# Patient Record
Sex: Female | Born: 1935 | Race: White | Hispanic: No | Marital: Married | Smoking: Never smoker
Health system: Southern US, Community
[De-identification: ages and names within clinical notes are randomized; demographics above are authoritative.]

## PROBLEM LIST (undated history)

## (undated) DIAGNOSIS — K219 Gastro-esophageal reflux disease without esophagitis: Secondary | ICD-10-CM

## (undated) DIAGNOSIS — H3530 Unspecified macular degeneration: Secondary | ICD-10-CM

## (undated) DIAGNOSIS — I10 Essential (primary) hypertension: Secondary | ICD-10-CM

## (undated) DIAGNOSIS — Z950 Presence of cardiac pacemaker: Secondary | ICD-10-CM

## (undated) DIAGNOSIS — M1990 Unspecified osteoarthritis, unspecified site: Secondary | ICD-10-CM

## (undated) DIAGNOSIS — I480 Paroxysmal atrial fibrillation: Secondary | ICD-10-CM

## (undated) DIAGNOSIS — H3560 Retinal hemorrhage, unspecified eye: Secondary | ICD-10-CM

## (undated) DIAGNOSIS — C50911 Malignant neoplasm of unspecified site of right female breast: Secondary | ICD-10-CM

## (undated) HISTORY — PX: BREAST BIOPSY: SHX20

## (undated) HISTORY — PX: TONSILLECTOMY: SUR1361

## (undated) HISTORY — DX: Paroxysmal atrial fibrillation: I48.0

## (undated) HISTORY — PX: DILATION AND CURETTAGE OF UTERUS: SHX78

## (undated) HISTORY — DX: Essential (primary) hypertension: I10

## (undated) HISTORY — DX: Retinal hemorrhage, unspecified eye: H35.60

## (undated) HISTORY — PX: INSERT / REPLACE / REMOVE PACEMAKER: SUR710

## (undated) HISTORY — DX: Unspecified osteoarthritis, unspecified site: M19.90

## (undated) HISTORY — DX: Unspecified macular degeneration: H35.30

---

## 1997-02-10 DIAGNOSIS — C50911 Malignant neoplasm of unspecified site of right female breast: Secondary | ICD-10-CM

## 1997-02-10 HISTORY — DX: Malignant neoplasm of unspecified site of right female breast: C50.911

## 1997-02-10 HISTORY — PX: BREAST LUMPECTOMY: SHX2

## 1997-09-08 ENCOUNTER — Ambulatory Visit (HOSPITAL_COMMUNITY): Admission: RE | Admit: 1997-09-08 | Discharge: 1997-09-08 | Payer: Self-pay | Admitting: Internal Medicine

## 1997-09-08 ENCOUNTER — Ambulatory Visit (HOSPITAL_COMMUNITY): Admission: RE | Admit: 1997-09-08 | Discharge: 1997-09-08 | Payer: Self-pay | Admitting: Obstetrics & Gynecology

## 1997-09-13 ENCOUNTER — Ambulatory Visit (HOSPITAL_COMMUNITY): Admission: RE | Admit: 1997-09-13 | Discharge: 1997-09-13 | Payer: Self-pay | Admitting: Internal Medicine

## 1997-10-03 ENCOUNTER — Ambulatory Visit (HOSPITAL_COMMUNITY): Admission: RE | Admit: 1997-10-03 | Discharge: 1997-10-03 | Payer: Self-pay | Admitting: *Deleted

## 1997-10-18 ENCOUNTER — Ambulatory Visit (HOSPITAL_COMMUNITY): Admission: RE | Admit: 1997-10-18 | Discharge: 1997-10-18 | Payer: Self-pay | Admitting: *Deleted

## 1997-11-07 ENCOUNTER — Encounter: Admission: RE | Admit: 1997-11-07 | Discharge: 1998-02-05 | Payer: Self-pay | Admitting: Radiation Oncology

## 1998-05-16 ENCOUNTER — Ambulatory Visit (HOSPITAL_COMMUNITY): Admission: RE | Admit: 1998-05-16 | Discharge: 1998-05-16 | Payer: Self-pay

## 1998-07-27 ENCOUNTER — Ambulatory Visit (HOSPITAL_COMMUNITY): Admission: RE | Admit: 1998-07-27 | Discharge: 1998-07-27 | Payer: Self-pay | Admitting: *Deleted

## 1998-09-24 ENCOUNTER — Encounter: Payer: Self-pay | Admitting: Internal Medicine

## 1998-09-24 ENCOUNTER — Ambulatory Visit (HOSPITAL_COMMUNITY): Admission: RE | Admit: 1998-09-24 | Discharge: 1998-09-24 | Payer: Self-pay | Admitting: Internal Medicine

## 1998-10-08 ENCOUNTER — Ambulatory Visit (HOSPITAL_COMMUNITY): Admission: RE | Admit: 1998-10-08 | Discharge: 1998-10-08 | Payer: Self-pay | Admitting: Internal Medicine

## 1998-10-08 ENCOUNTER — Encounter: Payer: Self-pay | Admitting: Internal Medicine

## 1998-10-26 ENCOUNTER — Ambulatory Visit (HOSPITAL_COMMUNITY): Admission: RE | Admit: 1998-10-26 | Discharge: 1998-10-26 | Payer: Self-pay | Admitting: *Deleted

## 1998-11-12 ENCOUNTER — Encounter: Admission: RE | Admit: 1998-11-12 | Discharge: 1999-02-10 | Payer: Self-pay | Admitting: Gastroenterology

## 1999-02-05 ENCOUNTER — Encounter: Payer: Self-pay | Admitting: Internal Medicine

## 1999-02-05 ENCOUNTER — Inpatient Hospital Stay (HOSPITAL_COMMUNITY): Admission: EM | Admit: 1999-02-05 | Discharge: 1999-02-06 | Payer: Self-pay | Admitting: Cardiovascular Disease

## 1999-02-05 HISTORY — PX: CARDIAC CATHETERIZATION: SHX172

## 1999-03-04 ENCOUNTER — Encounter: Payer: Self-pay | Admitting: Internal Medicine

## 1999-03-04 ENCOUNTER — Encounter: Admission: RE | Admit: 1999-03-04 | Discharge: 1999-03-04 | Payer: Self-pay | Admitting: Internal Medicine

## 1999-03-04 ENCOUNTER — Encounter: Admission: RE | Admit: 1999-03-04 | Discharge: 1999-06-02 | Payer: Self-pay | Admitting: Gastroenterology

## 1999-04-24 ENCOUNTER — Ambulatory Visit (HOSPITAL_COMMUNITY): Admission: RE | Admit: 1999-04-24 | Discharge: 1999-04-24 | Payer: Self-pay | Admitting: General Surgery

## 1999-04-24 ENCOUNTER — Encounter: Payer: Self-pay | Admitting: General Surgery

## 1999-07-12 ENCOUNTER — Ambulatory Visit (HOSPITAL_COMMUNITY): Admission: RE | Admit: 1999-07-12 | Discharge: 1999-07-12 | Payer: Self-pay | Admitting: Obstetrics & Gynecology

## 1999-09-12 ENCOUNTER — Encounter: Admission: RE | Admit: 1999-09-12 | Discharge: 1999-12-11 | Payer: Self-pay | Admitting: Gastroenterology

## 1999-11-20 ENCOUNTER — Encounter: Payer: Self-pay | Admitting: General Surgery

## 1999-11-20 ENCOUNTER — Encounter: Admission: RE | Admit: 1999-11-20 | Discharge: 1999-11-20 | Payer: Self-pay | Admitting: General Surgery

## 2000-01-21 ENCOUNTER — Encounter: Admission: RE | Admit: 2000-01-21 | Discharge: 2000-04-20 | Payer: Self-pay | Admitting: Gastroenterology

## 2000-06-03 ENCOUNTER — Encounter (INDEPENDENT_AMBULATORY_CARE_PROVIDER_SITE_OTHER): Payer: Self-pay | Admitting: Specialist

## 2000-06-03 ENCOUNTER — Ambulatory Visit (HOSPITAL_COMMUNITY): Admission: RE | Admit: 2000-06-03 | Discharge: 2000-06-03 | Payer: Self-pay | Admitting: Gastroenterology

## 2000-06-04 ENCOUNTER — Encounter: Admission: RE | Admit: 2000-06-04 | Discharge: 2000-09-02 | Payer: Self-pay | Admitting: Gastroenterology

## 2000-07-24 ENCOUNTER — Ambulatory Visit (HOSPITAL_COMMUNITY): Admission: RE | Admit: 2000-07-24 | Discharge: 2000-07-24 | Payer: Self-pay | Admitting: *Deleted

## 2000-09-09 ENCOUNTER — Ambulatory Visit (HOSPITAL_COMMUNITY): Admission: RE | Admit: 2000-09-09 | Discharge: 2000-09-09 | Payer: Self-pay | Admitting: Gastroenterology

## 2000-10-29 ENCOUNTER — Encounter: Admission: RE | Admit: 2000-10-29 | Discharge: 2001-01-27 | Payer: Self-pay | Admitting: Gastroenterology

## 2000-11-23 ENCOUNTER — Encounter: Payer: Self-pay | Admitting: General Surgery

## 2000-11-23 ENCOUNTER — Encounter: Admission: RE | Admit: 2000-11-23 | Discharge: 2000-11-23 | Payer: Self-pay | Admitting: General Surgery

## 2001-04-29 ENCOUNTER — Encounter: Admission: RE | Admit: 2001-04-29 | Discharge: 2001-07-28 | Payer: Self-pay | Admitting: Gastroenterology

## 2001-07-30 ENCOUNTER — Ambulatory Visit (HOSPITAL_COMMUNITY): Admission: RE | Admit: 2001-07-30 | Discharge: 2001-07-30 | Payer: Self-pay | Admitting: Obstetrics & Gynecology

## 2001-08-25 ENCOUNTER — Emergency Department (HOSPITAL_COMMUNITY): Admission: EM | Admit: 2001-08-25 | Discharge: 2001-08-25 | Payer: Self-pay | Admitting: Emergency Medicine

## 2001-08-25 ENCOUNTER — Encounter: Payer: Self-pay | Admitting: Emergency Medicine

## 2001-10-13 ENCOUNTER — Encounter: Admission: RE | Admit: 2001-10-13 | Discharge: 2001-11-24 | Payer: Self-pay | Admitting: Gastroenterology

## 2001-11-26 ENCOUNTER — Encounter: Payer: Self-pay | Admitting: Internal Medicine

## 2001-11-26 ENCOUNTER — Encounter: Admission: RE | Admit: 2001-11-26 | Discharge: 2001-11-26 | Payer: Self-pay | Admitting: Internal Medicine

## 2001-12-13 ENCOUNTER — Encounter: Admission: RE | Admit: 2001-12-13 | Discharge: 2002-03-13 | Payer: Self-pay | Admitting: Gastroenterology

## 2002-01-11 ENCOUNTER — Emergency Department (HOSPITAL_COMMUNITY): Admission: EM | Admit: 2002-01-11 | Discharge: 2002-01-11 | Payer: Self-pay | Admitting: Emergency Medicine

## 2002-08-19 ENCOUNTER — Ambulatory Visit (HOSPITAL_COMMUNITY): Admission: RE | Admit: 2002-08-19 | Discharge: 2002-08-19 | Payer: Self-pay | Admitting: Obstetrics & Gynecology

## 2002-10-25 ENCOUNTER — Ambulatory Visit (HOSPITAL_COMMUNITY): Admission: RE | Admit: 2002-10-25 | Discharge: 2002-10-25 | Payer: Self-pay | Admitting: Gastroenterology

## 2002-11-28 ENCOUNTER — Encounter: Admission: RE | Admit: 2002-11-28 | Discharge: 2002-11-28 | Payer: Self-pay | Admitting: Internal Medicine

## 2002-11-28 ENCOUNTER — Encounter: Payer: Self-pay | Admitting: Internal Medicine

## 2003-11-29 ENCOUNTER — Encounter: Admission: RE | Admit: 2003-11-29 | Discharge: 2003-11-29 | Payer: Self-pay | Admitting: General Surgery

## 2003-12-04 ENCOUNTER — Encounter (HOSPITAL_COMMUNITY): Admission: RE | Admit: 2003-12-04 | Discharge: 2004-03-03 | Payer: Self-pay | Admitting: General Surgery

## 2004-04-29 ENCOUNTER — Emergency Department (HOSPITAL_COMMUNITY): Admission: EM | Admit: 2004-04-29 | Discharge: 2004-04-29 | Payer: Self-pay | Admitting: Emergency Medicine

## 2004-11-29 ENCOUNTER — Encounter: Admission: RE | Admit: 2004-11-29 | Discharge: 2004-11-29 | Payer: Self-pay | Admitting: General Surgery

## 2005-05-15 ENCOUNTER — Encounter: Admission: RE | Admit: 2005-05-15 | Discharge: 2005-05-15 | Payer: Self-pay | Admitting: General Surgery

## 2005-12-01 ENCOUNTER — Encounter: Admission: RE | Admit: 2005-12-01 | Discharge: 2005-12-01 | Payer: Self-pay | Admitting: General Surgery

## 2005-12-15 ENCOUNTER — Encounter: Admission: RE | Admit: 2005-12-15 | Discharge: 2005-12-15 | Payer: Self-pay | Admitting: General Surgery

## 2006-06-27 ENCOUNTER — Emergency Department (HOSPITAL_COMMUNITY): Admission: EM | Admit: 2006-06-27 | Discharge: 2006-06-27 | Payer: Self-pay | Admitting: Family Medicine

## 2006-06-27 ENCOUNTER — Ambulatory Visit (HOSPITAL_COMMUNITY): Admission: RE | Admit: 2006-06-27 | Discharge: 2006-06-27 | Payer: Self-pay | Admitting: Family Medicine

## 2006-09-28 ENCOUNTER — Ambulatory Visit (HOSPITAL_COMMUNITY): Admission: RE | Admit: 2006-09-28 | Discharge: 2006-09-28 | Payer: Self-pay | Admitting: Internal Medicine

## 2006-10-14 ENCOUNTER — Ambulatory Visit (HOSPITAL_COMMUNITY): Admission: RE | Admit: 2006-10-14 | Discharge: 2006-10-14 | Payer: Self-pay | Admitting: Internal Medicine

## 2006-12-01 ENCOUNTER — Emergency Department (HOSPITAL_COMMUNITY): Admission: EM | Admit: 2006-12-01 | Discharge: 2006-12-01 | Payer: Self-pay | Admitting: Emergency Medicine

## 2006-12-03 ENCOUNTER — Encounter: Admission: RE | Admit: 2006-12-03 | Discharge: 2006-12-03 | Payer: Self-pay | Admitting: Surgery

## 2007-01-05 ENCOUNTER — Emergency Department (HOSPITAL_COMMUNITY): Admission: EM | Admit: 2007-01-05 | Discharge: 2007-01-05 | Payer: Self-pay | Admitting: Emergency Medicine

## 2007-01-05 ENCOUNTER — Ambulatory Visit: Payer: Self-pay | Admitting: Cardiovascular Disease

## 2007-08-12 ENCOUNTER — Encounter: Admission: RE | Admit: 2007-08-12 | Discharge: 2007-08-12 | Payer: Self-pay | Admitting: Surgery

## 2007-11-13 ENCOUNTER — Emergency Department (HOSPITAL_COMMUNITY): Admission: EM | Admit: 2007-11-13 | Discharge: 2007-11-13 | Payer: Self-pay | Admitting: Emergency Medicine

## 2007-12-06 ENCOUNTER — Encounter: Admission: RE | Admit: 2007-12-06 | Discharge: 2007-12-06 | Payer: Self-pay | Admitting: Surgery

## 2007-12-21 ENCOUNTER — Encounter: Admission: RE | Admit: 2007-12-21 | Discharge: 2007-12-21 | Payer: Self-pay | Admitting: Surgery

## 2008-12-06 ENCOUNTER — Encounter: Admission: RE | Admit: 2008-12-06 | Discharge: 2008-12-06 | Payer: Self-pay | Admitting: Surgery

## 2009-12-07 ENCOUNTER — Encounter: Admission: RE | Admit: 2009-12-07 | Discharge: 2009-12-07 | Payer: Self-pay | Admitting: Surgery

## 2010-01-18 ENCOUNTER — Ambulatory Visit: Payer: Self-pay | Admitting: Cardiovascular Disease

## 2010-02-10 HISTORY — PX: ENUCLEATION: SHX628

## 2010-06-25 NOTE — Consult Note (Signed)
Megan Hester, Megan Hester NO.:  [id number]   MEDICAL RECORD NO.:  [id number]          PATIENT TYPE:  EMS   LOCATION:  MAJO                         FACILITY:  [facility]   PHYSICIAN:  Christell Faith, MD   DATE OF BIRTH:  03-13-1930   DATE OF CONSULTATION:  01/05/2007  DATE OF DISCHARGE:                                 CONSULTATION   CARDIOLOGY EMERGENCY ROOM CONSULTATION   CARDIOLOGIST:  Dr. Elease Hashimoto.   CHIEF COMPLAINT:  Atrial fibrillation.   HISTORY OF PRESENT ILLNESS:  This is a 77 year old white female with a  history of a negative coronary angiogram in 2000 and paroxysmal atrial  fibrillation for at least that long, which has been well controlled  throughout the years on [place].  However, she has been having more  atrial fibrillation episodes recently.  She says approximately once a  week she feels herself go into atrial fibrillation.  Usually it  spontaneously converts to sinus rhythm within a matter of minutes;  however, tonight at approximately 7 p.m., she once again felt herself go  into atrial fibrillation, tried to sleep but could not, the rhythm  persisted until 1 a.m., at which time she presented to the emergency  department and converted to normal sinus rhythm with IV Diltiazem bolus  20 mg.  She has remained in sinus rhythm on a Diltiazem drip in the  emergency department for the past couple of hours.  Other than this, she  has been feeling very well lately without chest pain, stroke or TIA.  She does admit to shortness of breath while in atrial fibrillation.   The patient reports that aspirin causes bright red blood per rectum,  which she strongly believes is an allergy.  She does have an appointment  with Dr. Elease Hashimoto next Monday to discuss Coumadin.  Apparently, Dr.  Jacky Kindle, her primary care physician, is in favor of Coumadin.  The  patient reports a recent treadmill and echo test, and according to the  patient and her husband, everything looked  good.   PAST MEDICAL HISTORY:  1. Paroxysmal atrial fibrillation.  2. Breast cancer.  3. Hypertension.  4. Macular degeneration.  5. Allergic rhinitis.   SOCIAL HISTORY:  The patient is a nonsmoker, and the patient is married.   MEDICATIONS:  1. Lisinopril/hydrochlorothiazide 20/12.5 mg p.o. daily.  2. Cartia-XT 180 mg p.o. daily.  3. Flonase.  4. Allegra.   REVIEW OF SYSTEMS:  Positive for palpitations and shortness of breath.  Negative for fevers, chills, sweats, weight change, chest pain, nausea,  vomiting, diarrhea, stroke or TIA.   PHYSICAL EXAMINATION:  VITAL SIGNS:  Temperature 97.5, blood pressure  134/81, respiratory rate 15.  Heart rate 122 initially, then 65 after  conversion to sinus rhythm.  Saturation 97% on room air.  GENERAL:  This is a very pleasant white female in no distress.  She  appears younger than her stated age.  HEENT:  Pupils are round and reactive.  Sclerae clear.  Mucous membranes  are moist.  NECK:  Supple.  No elevation of JVD.  No carotid bruits.  No  thyromegaly.  Carotid upstrokes are normal to palpation.  LUNGS:  Clear to auscultation bilaterally without wheezing.  CARDIAC:  Normal rate, regular rhythm.  No murmurs.  ABDOMEN:  Soft, nontender, nondistended.  Normal bowel sounds.  EXTREMITIES:  Reveal no edema, 2+ and regular dorsalis, pedis and radial  pulses.   DIAGNOSTIC TESTS:  1. Electrocardiogram #1 reveals atrial fibrillation with a rapid      ventricular response and an incomplete right bundle branch block,      rate 121 beats per minute.  2. EKG #2 shows normal sinus rhythm with a rate of 66 and an      incomplete right bundle branch block.   LABORATORY DATA:  Sodium 140, potassium 3.6, BUN 14, hemoglobin 15.3, CK-  MB less than 1, troponin less than 0.05.   IMPRESSION:  A 77 year old white female with paroxysmal atrial  fibrillation.  She converted to sinus rhythm with intravenous Diltiazem.  Her CHADS2 score is 2 if we give  her a point for her age.  The patient  feels strongly that she cannot take aspirin.  We discussed different  options, including admitting her to the hospital to start heparin and  Coumadin bridge.  We also discussed discharging her from the emergency  room with a Lovenox-Coumadin bridge.  And finally we discussed  discharging her from the hospital with instructions to follow up with  Dr. Elease Hashimoto over the telephone first thing in the morning.  This is what  the patient and her husband are most comfortable with because they are  not entirely sure that Dr. Elease Hashimoto is in favor of her starting Coumadin.  What we will do is give her a shot of therapeutic dose Lovenox tonight.  We will turn off the intravenous Diltiazem and watch her for 30 to 45  minutes, and as long as she stays in sinus rhythm, we will discharge her  from the emergency room at that point to follow up with Dr. Elease Hashimoto over  the telephone in the morning.  We discussed reasons for which she should  return to the emergency room, including recurrence of atrial  fibrillation, chest pain, profound shortness of breath, fever, vomiting,  bleeding or any other abnormalities that are concerning to her.  Both  she and her husband voiced understanding.      Christell Faith, MD  Electronically Signed     NDL/MEDQ  D:  01/05/2007  T:  01/05/2007  Job:  [id number]

## 2010-06-28 NOTE — Cardiovascular Report (Signed)
[place]  Patient:    Megan Hester                       MRN: [id number] Proc. Date: 02/05/99 Adm. Date:  [id number] Disc. Date: [id number] Attending:  Deneen Harts                        Cardiac Catheterization  INDICATIONS:  [name] is a 77 year old female who presents to the ER with acute onset of chest pain.  She is referred for heart catheterization for further evaluation.  PROCEDURE:  Right and left heart catheterization.  HEMODYNAMIC DATA:  The LV pressure was 108/8.  The aortic pressure was 104/68. The pulmonary artery pressure was 50/27.  The pulmonary capillary wedge pressure was 25.  On saturations, the RFA saturation was 94.2%.  The pulmonary artery saturation was 69.2%.  Hemoglobin was 13.6%.  The Fick cardiac output was 4.4 liters/minute.  ANGIOGRAPHY: 1. The left main coronary artery is smooth and normal. 2. The left anterior descending artery is relatively smooth and normal.  There    are a few moderate irregularities in the mid region.  The first diagonal    branch is a small to moderate size vessel and is essentially normal.  The    distal LAD reaches around the apex and supplies the inferoapical wall.  It    is relatively normal. 3. The left circumflex artery is a moderate to large vessel.  It supplies a    large obtuse marginal artery and then a large posterolateral artery.  The    circumflex artery is normal. 4. The right coronary artery is large and dominant.  There are minor luminal    irregularities in the mid segment.  The posterior descending artery is    relatively small but is otherwise normal.  The left ventriculogram was performed in a 30-degree RAO position.  It reveals hyperdynamic left ventricle with an ejection fraction of 75-80%.  There is no mitral regurgitation.  COMPLICATIONS:  None.  CONCLUSIONS: 1. Relatively smooth and normal coronary arteries. 2. Normal left ventricular systolic  function. 3. Mildly elevated pulmonary artery pressures. DD:  05/09/99 TD:  05/10/99 Job: [id number]

## 2010-06-28 NOTE — Op Note (Signed)
[facility]  Patient:    Megan Hester, Megan Hester                      MRN: [id number] Adm. Date:  [id number] Attending:  Louie Bun CC:         Richard A. Jacky Kindle, M.D.   Operative Report  INDICATIONS FOR PROCEDURE:  Irregular cecal polyp, removed by snare two months ago; felt not to be completely removed.  PROCEDURE:  The patient was placed in the left lateral decubitus position and placed on the pulse monitor, with continuous low-flow oxygen delivered by nasal cannula.  She was sedated with 70 mg IV Demerol and 7.5 mg IV Versed. The Olympus video colonoscope was inserted into the rectum and advanced to the cecum, confirmed by transillumination of McBurneys point and visualization of the ileocecal valve and appendiceal orifice.  The prep was excellent,  In the base of the cecum cautery marks of the previous polypectomy were visible, and there appeared to be a small area of residual polyp that was poorly delineated from the surrounding mucosa.  This was ensnared, and was approximately 8 mm in diameter; removed for histology.  The surrounding tissue appeared to be more consistent with mild granularity rather than residual polyp.  I did not perform any further cautery.  The remainder of the cecum, ascending, transverse, descending and sigmoid colon all appeared normal, with no masses, polyps, diverticula or other mucosal abnormalities.  The rectum, likewise, appeared normal in retroflex view.  The anus revealed no obvious internal hemorrhoids.  Colonoscope was then withdrawn and the patient returned to the recovery room in stable condition.  She tolerated the procedure well and there were no immediate complications.  IMPRESSION:  Residual cecal polyp, felt to be completely removed.  PLAN:  Repeat colonoscopy in approximately two years. DD:  09/08/00 TD:  09/09/00 Job: [id number]

## 2010-06-28 NOTE — Discharge Summary (Signed)
[place]. [facility]  Patient:    Megan Hester, Megan Hester                     MRN: [id number] Adm. Date:  02/05/99 Disc. Date: 02/06/99 Attending:  Alvia Grove., M.D. CC:         Pearletha Furl. Jacky Kindle, M.D.                           Discharge Summary  DISCHARGE DIAGNOSES: 1. Chest pain--noncardiac--pulmonary embolus ruled out. 2. History of hypertension. 3. Atrial fibrillation.  DISCHARGE MEDICATIONS: 1. Levaquin 500 mg a day for 7-10 days. 2. Prevacid 30 mg twice a day. 3. Claritin 10 mg a day. 4. Prinivil 20 mg a day. 5. Flonase once a day.  FOLLOW-UP:  The patient will see Dr. Elease Hashimoto in one week.  She will see Dr. Jacky Kindle in one week.  HISTORY:  Megan Hester is a 77 year old female who was seen at [facility] for acute onset of chest pain.  She was found to have atrial fibrillation with a rapid ventricular response with associated ST and T wave changes.  She was brought to [facility] for emergent heart catheterization.  HOSPITAL COURSE: #1 - CHEST PAIN:  The patient had an emergent heart catheterization and was found to have normal coronary arteries.  She did have a mildly elevated pulmonary capillary wedge pressure of 25.  Her PA pressure was relatively normal at 37. Because of her episodes of chest pain with normal coronary arteries, she underwent ______ CT scan to look for pulmonary embolus.  The ______ CT was negative for pulmonary embolus.  She was discharged to home on the following day in satisfactory condition.  It was felt that she may have had some bronchitis and was treated with Levaquin.  #2 - ATRIAL FIBRILLATION:  The patient was admitted with an acute episode of atrial fibrillation.  She converted to sinus rhythm in the catheterization lab and maintained sinus rhythm throughout.  We will continue to follow her closely and  look for any other signs of atrial fibrillation. DD:  04/03/99 TD:  04/03/99 Job:  [id number]

## 2010-06-28 NOTE — Op Note (Signed)
   NAME:  Megan Hester, Megan Hester                         ACCOUNT NO.:  [id number]   MEDICAL RECORD NO.:  [id number]                   PATIENT TYPE:  AMB   LOCATION:  ENDO                                 FACILITY:  [facility]   PHYSICIAN:  John C. Madilyn Fireman, M.D.                 DATE OF BIRTH:  Sep 07, 1930   DATE OF PROCEDURE:  10/25/2002  DATE OF DISCHARGE:                                 OPERATIVE REPORT   PROCEDURE:  Colonoscopy.   INDICATION FOR PROCEDURE:  History of adenomatous polyp on colonoscopy two  years ago requiring two sessions to remove the polyp.   DESCRIPTION OF PROCEDURE:  The patient was placed in the left lateral  decubitus position and placed on the pulse monitor with continuous low-flow  oxygen delivered by nasal cannula.  She was sedated with 50 mcg of IV  fentanyl and 5 mg of IV Versed.  The Olympus video colonoscope was inserted  into the rectum and advanced to the cecum, confirmed by transillumination at  McBurney's point and visualization of the ileocecal valve and appendiceal  orifice.  The prep was excellent.  The cecum was well inspected and no  residual polyp was seen.  The  ascending, transverse, and descending colon  also appeared normal with no masses, polyps, diverticula or other mucosal  abnormalities.  There were a few scattered diverticula seen in the sigmoid  colon, the rectum appeared normal and retroflex view of the anus revealed no  obvious internal hemorrhoids.  The scope was then withdrawn and the patient  returned to the recovery room in stable condition, she tolerated the  procedure well and there were no immediate complications.   IMPRESSION:  1. A few sigmoid diverticula.  2. Otherwise normal study.   PLAN:  Repeat colonoscopy in five years based on her previous polyps.                                                 John C. Madilyn Fireman, M.D.    JCH/MEDQ  D:  10/25/2002  T:  10/25/2002  Job:  [id number]   cc:   Dr. Dareen Piano

## 2010-06-28 NOTE — Procedures (Signed)
[facility]  Patient:    Megan Hester, Megan Hester                      MRN: [id number] Proc. Date: 06/03/00 Adm. Date:  [id number] Attending:  Louie Bun CC:         Richard A. Jacky Kindle, M.D.   Procedure Report  INDICATIONS FOR PROCEDURE:  History of colon polyps with last colonoscopy five years ago.  PROCEDURE:  The patient was placed in the left lateral decubitus position and placed on the pulse monitor with continuous low-flow oxygen delivered by nasal cannula.  She was sedated with 80 mg of IV Demerol and 7 mg of IV Versed.  The Olympus video colonoscope was inserted into the rectum and advanced to the cecum, confirmed by transillumination of the McBurneys point and visualization of the ileocecal valve and appendiceal orifice.  The prep was generally excellent but was slightly limited in the cecum, requiring several syringefuls of water lavage.  Near the base of the cecum just proximal to the first haustral ridge was what appeared to be a vaguely delineated sessile polyp with very poorly defined margins.  The center of this area did appear consistent with an adenoma, but the margins did not sharply delineate from the surrounding mucosa.  After observing the lesion for some time, I decided to apply a snare loop around the majority of it, although I did not feel that I completely encircled the lesion.  One slice of the presumed polyp was obtained with the snare and sent for histologic evaluation.  The remainder of the cecum, ascending, transverse, descending, sigmoid, and rectum appeared normal with no further polyps, masses, diverticula, or other mucosal abnormalities. The scope was then withdrawn, and the patient returned to the recovery room in stable condition.  She tolerated the procedure well, and there were no immediate complications.  IMPRESSION:  Favorably delineated cecal polyp, partially removed with a snare.  PLAN:  Await histology and if  adenomatous will probably need a repeat colonoscopy in three months, hopefully for complete fulguration of the lesion with hot-biopsy or other means. DD:  06/03/00 TD:  06/04/00 Job: [id number]

## 2010-07-03 ENCOUNTER — Encounter (INDEPENDENT_AMBULATORY_CARE_PROVIDER_SITE_OTHER): Payer: Self-pay | Admitting: Surgery

## 2010-07-05 ENCOUNTER — Encounter: Payer: Self-pay | Admitting: Internal Medicine

## 2010-07-16 ENCOUNTER — Encounter: Payer: Self-pay | Admitting: Cardiovascular Disease

## 2010-07-16 ENCOUNTER — Ambulatory Visit (INDEPENDENT_AMBULATORY_CARE_PROVIDER_SITE_OTHER): Payer: Medicare Other | Admitting: Cardiovascular Disease

## 2010-07-16 VITALS — BP 130/80 | HR 68 | Ht 64.0 in | Wt 194.2 lb

## 2010-07-16 DIAGNOSIS — I480 Paroxysmal atrial fibrillation: Secondary | ICD-10-CM | POA: Insufficient documentation

## 2010-07-16 DIAGNOSIS — I4891 Unspecified atrial fibrillation: Secondary | ICD-10-CM

## 2010-07-16 NOTE — Assessment & Plan Note (Signed)
She is currently in NSR.  Will continue current meds.  I'll see in 1 year.

## 2010-07-16 NOTE — Progress Notes (Signed)
Megan Hester Date of Birth  1930/06/08 [facility] [address]     [address] [place], [place]  [address] [phone]  Fax  [phone]  History of Present Illness:  77 yo with intermittant a-fib.  Not on coumadin due to retinal hemorrhage.  Current Outpatient Prescriptions on File Prior to Visit  Medication Sig Dispense Refill  . aspirin 81 MG EC tablet Take 81 mg by mouth daily.        . fexofenadine (ALLEGRA) 180 MG tablet Take 180 mg by mouth daily.        . fluticasone (FLONASE) 50 MCG/ACT nasal spray 2 sprays by Nasal route daily.        [name] Kitchen lisinopril-hydrochlorothiazide (PRINZIDE,ZESTORETIC) 20-25 MG per tablet Take 1 tablet by mouth daily. 1/2 DAILY      . metoprolol (TOPROL-XL) 50 MG 24 hr tablet Take 25 mg by mouth 2 (two) times daily.       . Multiple Vitamins-Minerals (CENTRUM SILVER PO) Take by mouth.          Allergies  Allergen Reactions  . Ceclor (Cefaclor)   . Codeine   . Contrast Media (Iodinated Diagnostic Agents)   . Doxycycline   . Nalfon   . Penicillins   . Sulfa Drugs Cross Reactors     Past Medical History  Diagnosis Date  . Muscular degeneration   . Hypertension   . Atrial fibrillation   . Retinal hemorrhage   . Chest pain     Past Surgical History  Procedure Date  . Breast lumpectomy 1999    RADIATION  . Cardiac catheterization 02/05/1999    HYPERDYNAMIC LEFT VENTRICLE WITH EF OF 75-80%    History  Smoking status  . Never Smoker   Smokeless tobacco  . Not on file    History  Alcohol Use No    Family History  Problem Relation Age of Onset  . Hypertension Father   . Diabetes Brother   . Heart disease Brother     Reviw of Systems:  Reviewed in the HPI.  All other systems are negative.  Physical Exam: BP 130/80  Pulse 68  Ht 5\' 4"  (1.626 m)  Wt 194 lb 3.2 oz (88.089 kg)  BMI 33.33 kg/m2 The patient is alert and oriented x 3.  The mood and affect are normal.  The skin is  warm and dry.  Color is normal.  The HEENT exam reveals that the sclera are nonicteric.  The mucous membranes are moist.  The carotids are 2+ without bruits.  There is no thyromegaly.  There is no JVD.  The lungs are clear.  The chest wall is non tender.  The heart exam reveals a regular rate with a normal S1 and S2.  There are no murmurs, gallops, or rubs.  The PMI is not displaced.   Abdominal exam reveals good bowel sounds.  There is no guarding or rebound.  There is no hepatosplenomegaly or tenderness.  There are no masses.  Exam of the legs reveal no clubbing, cyanosis, or edema.  The legs are without rashes.  The distal pulses are intact.  Cranial nerves II - XII are intact.  Motor and sensory functions are intact.  The gait is normal.  Assessment / Plan:

## 2010-08-16 ENCOUNTER — Encounter: Payer: Self-pay | Admitting: Internal Medicine

## 2010-08-19 ENCOUNTER — Other Ambulatory Visit: Payer: Self-pay | Admitting: Cardiovascular Disease

## 2010-08-20 ENCOUNTER — Other Ambulatory Visit: Payer: Self-pay | Admitting: *Deleted

## 2010-08-20 MED ORDER — PROPRANOLOL HCL 10 MG PO TABS: 10.0000 mg | ORAL_TABLET | ORAL | Status: DC | PRN

## 2010-08-20 NOTE — Telephone Encounter (Signed)
Fax received from pharmacy. Refill completed. Jodette Burkley Dech RN  

## 2010-09-09 ENCOUNTER — Ambulatory Visit: Payer: Medicare Other | Admitting: Nurse Practitioner

## 2010-09-10 ENCOUNTER — Telehealth: Payer: Self-pay | Admitting: Internal Medicine

## 2010-09-10 NOTE — Telephone Encounter (Signed)
Per pt husband call, pt has a new pt appt w/ Dr. Johney Frame on Thursday. Pt was just admitted to ED on Sunday (July 15 th) for a-fib after the removal of pt eye on Friday (July 13th). Pt husband wanted to make sure Dr. Johney Frame knew this information prior to pt appt w/ [facility]. No need to return pt call.

## 2010-09-11 ENCOUNTER — Encounter: Payer: Self-pay | Admitting: *Deleted

## 2010-09-12 ENCOUNTER — Ambulatory Visit (INDEPENDENT_AMBULATORY_CARE_PROVIDER_SITE_OTHER): Payer: Medicare Other | Admitting: Internal Medicine

## 2010-09-12 ENCOUNTER — Encounter: Payer: Self-pay | Admitting: Internal Medicine

## 2010-09-12 VITALS — BP 122/76 | HR 70 | Ht 64.0 in | Wt 188.0 lb

## 2010-09-12 DIAGNOSIS — I4891 Unspecified atrial fibrillation: Secondary | ICD-10-CM

## 2010-09-12 DIAGNOSIS — I10 Essential (primary) hypertension: Secondary | ICD-10-CM | POA: Insufficient documentation

## 2010-09-12 NOTE — Progress Notes (Signed)
Megan Hester is a pleasant 77 y.o. WF patient with a h/o paroxysmal atrial fibrillation who presents today to establish care in the EP clinic for further management of afib.  She reports initially developing afib in the setting of pneumonia 01/1999.  She was treated with diltiazem at that time.  She did well until 12/08 when she developed recurrence.  She was started on coumadin at that time.  Unfortunately, she subsequently developed hemorrhage within her R eye.  Coumadin was therefore stopped and she has been treated with ASA since.  She reports having no recurrence of afib from 2008 until several weeks ago.   She underwent R eye denucleation at [facility] 08/23/10.  She developed post operative atrial fibrillation 08/25/10.  She reports having intermittent afib for several days.  She has had no further afib over the past 2 weeks.  Presently, she is doing well.  She continues to heal from her recent eye surgery.     Today, she denies symptoms of chest pain, shortness of breath, orthopnea, PND, lower extremity edema, dizziness, presyncope, syncope, or neurologic sequela. The patient is tolerating medications without difficulties and is otherwise without complaint today.   Past Medical History  Diagnosis Date  . Macular degeneration   . Hypertension   . Paroxysmal atrial fibrillation   . 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 Arthritis    Past Surgical History  Procedure Date  . Breast lumpectomy 1999    RADIATION  . Cardiac catheterization 02/05/1999    HYPERDYNAMIC LEFT VENTRICLE WITH EF OF 75-80%  . Enucleation 2012    right eye    Current Outpatient Prescriptions  Medication Sig Dispense Refill  . aspirin 81 MG EC tablet Take 81 mg by mouth daily.        . Cholecalciferol (VITAMIN D) 1000 UNITS capsule Take 1,000 Units by mouth daily.        [name] Kitchen diltiazem (CARDIZEM CD) 180 MG 24 hr capsule Take 180 mg by mouth daily.        [name] Kitchen erythromycin ophthalmic  ointment as directed.      . fexofenadine (ALLEGRA) 180 MG tablet Take 180 mg by mouth daily.        . fluticasone (FLONASE) 50 MCG/ACT nasal spray 2 sprays by Nasal route daily.        [name] Kitchen lisinopril-hydrochlorothiazide (PRINZIDE,ZESTORETIC) 20-25 MG per tablet Take 1 tablet by mouth daily. 1/2 DAILY      . metoprolol (TOPROL-XL) 50 MG 24 hr tablet Take 25 mg by mouth 2 (two) times daily.       . Multiple Vitamins-Minerals (CENTRUM SILVER PO) Take by mouth.       . propranolol (INDERAL) 10 MG tablet Take 1 tablet (10 mg total) by mouth as needed (may take one tablet 3-4 times in a day, call physician with symptoms.).  30 tablet  1  . STUDY MEDICATION as directed.          Allergies  Allergen Reactions  . Ceclor (Cefaclor)   . Codeine   . Contrast Media (Iodinated Diagnostic Agents)   . Coumadin   . Doxycycline   . Nalfon   . Oxycodone   . Penicillins   . Shellfish Allergy   . Sulfa Drugs Cross Reactors     History   Social History  . Marital Status: Married    Spouse Name: N/A    Number of Children: 3  . Years of Education:  N/A   Occupational History  .     Social History Main Topics  . Smoking status: Never Smoker   . Smokeless tobacco: Not on file  . Alcohol Use: No  . Drug Use: No  . Sexually Active:    Other Topics Concern  . 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    Family History  Problem Relation Age of Onset  . Hypertension Father   . Diabetes Brother   . Heart disease Brother   . Stroke Mother   . Heart attack Father   . Parkinsonism Father     ROS- All systems are reviewed and negative except as per the HPI above  Physical Exam: Filed Vitals:   09/12/10 1153  BP: 122/76  Pulse: 70  Height: 5\' 4"  (1.626 m)  Weight: 188 lb (85.276 kg)    GEN- The patient is well appearing, alert and oriented x 3 today.   Head- normocephalic, atraumatic Eyes-  S/p denucleation of her R eye Ears- hearing  intact Oropharynx- clear Neck- supple, no JVP Lymph- no cervical lymphadenopathy Lungs- Clear to ausculation bilaterally, normal work of breathing Heart- Regular rate and rhythm, no murmurs, rubs or gallops, PMI not laterally displaced GI- soft, NT, ND, + BS Extremities- no clubbing, cyanosis, or edema MS- no significant deformity or atrophy Skin- no rash or lesion Psych- euthymic mood, full affect Neuro- strength and sensation are intact  EKG today reveals sinus rhythm 70 bpm, PR 200, incomplete RBBB  Assessment and Plan:

## 2010-09-12 NOTE — Patient Instructions (Signed)
Your physician recommends that you schedule a follow-up appointment in: 4 weeks  

## 2010-09-12 NOTE — Assessment & Plan Note (Signed)
Megan Hester presents today for EP evaluation of her atrial fibrillation.  She has a longstanding h/o paroxysmal atrial fibrillation but appears to have had very few symptomatic episodes over the past few years.  Her most recent afib has occurred post operatively following eye surgery.  This exacerbation of afib is likely due to increased perioperative catetcholamines. At this time, I will may no medicine changes.  We will follow conservatively.  If she has further afib in the near future, then we will consider an antiarrhythmic drug.  As her prior cath revealed no CAD, she would likely be a candidate for a Ic agent (such as flecainide).  She will continue her present rate control in the interim.   We also discussed her longterm anticoagulant options.  Her CHADSVASC score is 3.  She should therefore be anticoagulated with either coumadin, pradaxa, or xarelto.  She states that her INRs were previously quite labile and her occular hemorrhage was in the setting of an elevated INR. I will discuss with Dr Jacky Kindle.  She states that she fears stroke more than bleeding.  I therefore would recommend that we consider Xarelto. I will see her again in 4 weeks for further discussion.

## 2010-09-12 NOTE — Assessment & Plan Note (Signed)
Stable No change required today  

## 2010-10-23 ENCOUNTER — Encounter: Payer: Self-pay | Admitting: Internal Medicine

## 2010-10-23 ENCOUNTER — Ambulatory Visit (INDEPENDENT_AMBULATORY_CARE_PROVIDER_SITE_OTHER): Payer: Medicare Other | Admitting: Internal Medicine

## 2010-10-23 VITALS — BP 126/62 | HR 72 | Ht 64.0 in | Wt 193.0 lb

## 2010-10-23 DIAGNOSIS — I10 Essential (primary) hypertension: Secondary | ICD-10-CM

## 2010-10-23 DIAGNOSIS — I4891 Unspecified atrial fibrillation: Secondary | ICD-10-CM

## 2010-10-23 MED ORDER — RIVAROXABAN 20 MG PO TABS: 20.0000 mg | ORAL_TABLET | Freq: Every day | ORAL | Status: DC

## 2010-10-23 NOTE — Assessment & Plan Note (Signed)
Stable No change required today  

## 2010-10-23 NOTE — Progress Notes (Signed)
Megan Hester is a pleasant 77 y.o. WF patient with a h/o paroxysmal atrial fibrillation who presents today to establish care in the EP clinic for further management of afib. She has had no further afib since her last visit.  Presently, she is doing well.  She continues to heal from her recent eye surgery.     Today, she denies symptoms of chest pain, shortness of breath, orthopnea, PND, lower extremity edema, dizziness, presyncope, syncope, or neurologic sequela. The patient is tolerating medications without difficulties and is otherwise without complaint today.   Past Medical History  Diagnosis Date  . Macular degeneration   . Hypertension   . Paroxysmal atrial fibrillation   . 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 Arthritis    Past Surgical History  Procedure Date  . Breast lumpectomy 1999    RADIATION  . Cardiac catheterization 02/05/1999    HYPERDYNAMIC LEFT VENTRICLE WITH EF OF 75-80%  . Enucleation 2012    right eye    Current Outpatient Prescriptions  Medication Sig Dispense Refill  . Cholecalciferol (VITAMIN D) 1000 UNITS capsule Take 1,000 Units by mouth daily.        [name] Kitchen diltiazem (CARDIZEM CD) 240 MG 24 hr capsule Take 240 mg by mouth daily.        [name] Kitchen erythromycin ophthalmic ointment as directed.      . fexofenadine (ALLEGRA) 180 MG tablet Take 180 mg by mouth daily.        . fluticasone (FLONASE) 50 MCG/ACT nasal spray 2 sprays by Nasal route daily.        [name] Kitchen lisinopril-hydrochlorothiazide (PRINZIDE,ZESTORETIC) 20-25 MG per tablet Take by mouth. 1/2 tablet  DAILY      . metoprolol (TOPROL-XL) 50 MG 24 hr tablet Take 25 mg by mouth 2 (two) times daily.       . Multiple Vitamins-Minerals (CENTRUM SILVER PO) Take by mouth.       . propranolol (INDERAL) 10 MG tablet Take 1 tablet (10 mg total) by mouth as needed (may take one tablet 3-4 times in a day, call physician with symptoms.).  30 tablet  1  . STUDY MEDICATION as directed.        . Rivaroxaban  (XARELTO) 20 MG TABS Take 20 mg by mouth daily.  30 tablet  6    Allergies  Allergen Reactions  . Ceclor (Cefaclor)   . Codeine   . Contrast Media (Iodinated Diagnostic Agents)   . Coumadin   . Doxycycline   . Nalfon   . Oxycodone   . Penicillins   . Shellfish Allergy   . Sulfa Drugs Cross Reactors     History   Social History  . Marital Status: Married    Spouse Name: N/A    Number of Children: 3  . Years of Education: N/A   Occupational History  .     Social History Main Topics  . Smoking status: Never Smoker   . Smokeless tobacco: Not on file  . Alcohol Use: No  . Drug Use: No  . Sexually Active:    Other Topics Concern  . 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    Family History  Problem Relation Age of Onset  . Hypertension Father   . Diabetes Brother   . Heart disease Brother   . Stroke Mother   . Heart attack Father   . Parkinsonism Father  ROS- All systems are reviewed and negative except as per the HPI above  Physical Exam: Filed Vitals:   10/23/10 1032  BP: 126/62  Pulse: 72  Height: 5\' 4"  (1.626 m)  Weight: 193 lb (87.544 kg)    GEN- The patient is well appearing, alert and oriented x 3 today.   Head- normocephalic, atraumatic Eyes-  Prosthetic R eye Ears- hearing intact Oropharynx- clear Neck- supple, no JVP Lymph- no cervical lymphadenopathy Lungs- Clear to ausculation bilaterally, normal work of breathing Heart- Regular rate and rhythm, no murmurs, rubs or gallops, PMI not laterally displaced GI- soft, NT, ND, + BS Extremities- no clubbing, cyanosis, or edema MS- no significant deformity or atrophy Skin- no rash or lesion Psych- euthymic mood, full affect Neuro- strength and sensation are intact  Assessment and Plan:

## 2010-10-23 NOTE — Assessment & Plan Note (Signed)
Doing well without recurrence Her CHADSVASC score is 3 placing her at increased risk for stroke.  I recently spoke with Dr Lorain Childes who agrees that she should be anticoagulated if able. Given difficulty with coumadin, I have recommended Xarelto (She has seen commercials about pradaxa and is afraid of this medicine). Her CrCl is 90.  I will therefore start Xarelto 20mg  daily today.

## 2010-10-23 NOTE — Patient Instructions (Signed)
Your physician recommends that you schedule a follow-up appointment in: 4 months with Dr Johney Frame  Your physician has recommended you make the following change in your medication: start Xarelto 20 mg daily and Stop Aspirin

## 2010-11-04 ENCOUNTER — Other Ambulatory Visit: Payer: Self-pay | Admitting: Internal Medicine

## 2010-11-04 DIAGNOSIS — Z1231 Encounter for screening mammogram for malignant neoplasm of breast: Secondary | ICD-10-CM

## 2010-11-07 ENCOUNTER — Telehealth: Payer: Self-pay | Admitting: Internal Medicine

## 2010-11-07 NOTE — Telephone Encounter (Signed)
Returned call to patient and she started Xarelto on 10/28/10 and did fine until Monday  It bleed for 30 min and she had 2 yesterday that bleed for 30 min  She thinks she may have a sinus infection  Then started having lip swelling yesterday--thought it may be a fever blister at first but today the upper right lip is swollen and the left side is normal  She does having some" puffiness " on bottom left  Denies SOB says she feels ok otherwise  Will discuss with Dr Johney Frame and call her back

## 2010-11-07 NOTE — Telephone Encounter (Signed)
Pt calling stating that she is having trouble with blood thinner. Pt c/o nose bleeds (since Monday) and pt lips are little swollen (since Wednesday). Pt has been on blood thinner for about 10 days. Please return pt call to discuss further.

## 2010-11-08 ENCOUNTER — Telehealth: Payer: Self-pay | Admitting: Internal Medicine

## 2010-11-08 NOTE — Telephone Encounter (Signed)
Pt has questions regarding symptoms and her medication she is on

## 2010-11-08 NOTE — Telephone Encounter (Signed)
Patient wants for Dr. Johney Frame and Tresa Endo  Know that she continued taken Xarelto after talking to [facility] on 11/07/10 and she recommended to stop take the medication. Her nose bleeding and swollen lips have subsided. She things that her nose bleeding was due to sinus problem and  The swollen lips was due to a  Virus.

## 2010-11-10 NOTE — Telephone Encounter (Signed)
Please follow up with patient in am to see how she did over the weekend.

## 2010-11-11 NOTE — Telephone Encounter (Signed)
See note from Friday  All symptoms have subsided

## 2010-11-19 LAB — I-STAT 8, (EC8 V) (CONVERTED LAB)
Acid-base deficit: 1
Bicarbonate: 23.3
Glucose, Bld: 144 — ABNORMAL HIGH
Hemoglobin: 15.3 — ABNORMAL HIGH
Potassium: 3.6
Sodium: 140
TCO2: 24

## 2010-11-19 LAB — POCT CARDIAC MARKERS
CKMB, poc: <1 — ABNORMAL LOW
Troponin i, poc: <0.05

## 2010-11-20 LAB — I-STAT 8, (EC8 V) (CONVERTED LAB)
Acid-Base Excess: 1
Chloride: 107
HCT: 46
Operator id: 270111
Potassium: 3.5
Sodium: 140
TCO2: 25
pH, Ven: 7.471 — ABNORMAL HIGH

## 2010-11-20 LAB — DIFFERENTIAL
Basophils Absolute: 0.1
Eosinophils Relative: 1
Lymphocytes Relative: 22
Lymphs Abs: 2.6
Monocytes Absolute: 0.8 — ABNORMAL HIGH
Monocytes Relative: 7
Neutro Abs: 8 — ABNORMAL HIGH

## 2010-11-20 LAB — POCT CARDIAC MARKERS
Operator id: 270111
Troponin i, poc: <0.05

## 2010-11-20 LAB — CBC
HCT: 42.7
Hemoglobin: 14.3
RBC: 4.54
RDW: 13.2
WBC: 11.6 — ABNORMAL HIGH

## 2010-11-20 LAB — POCT I-STAT CREATININE
Creatinine, Ser: 0.6
Operator id: 270111

## 2010-11-22 LAB — CREATININE, SERUM: GFR calc non Af Amer: >60

## 2010-12-03 DIAGNOSIS — Z9001 Acquired absence of eye: Secondary | ICD-10-CM | POA: Insufficient documentation

## 2010-12-03 DIAGNOSIS — H3530 Unspecified macular degeneration: Secondary | ICD-10-CM | POA: Insufficient documentation

## 2010-12-03 DIAGNOSIS — Q111 Other anophthalmos: Secondary | ICD-10-CM | POA: Insufficient documentation

## 2010-12-09 ENCOUNTER — Ambulatory Visit
Admission: RE | Admit: 2010-12-09 | Discharge: 2010-12-09 | Disposition: A | Discharge status: Home / Self Care | Payer: Medicare Other | Source: Ambulatory Visit | Attending: Internal Medicine | Admitting: Internal Medicine

## 2010-12-09 DIAGNOSIS — Z1231 Encounter for screening mammogram for malignant neoplasm of breast: Secondary | ICD-10-CM

## 2010-12-23 ENCOUNTER — Other Ambulatory Visit: Payer: Self-pay | Admitting: Internal Medicine

## 2010-12-23 NOTE — Telephone Encounter (Signed)
Fax come in from [facility], approving pt for Xarelto 20Mg  through 12/10/2008.  Caralee Ates, CMA

## 2011-02-17 ENCOUNTER — Ambulatory Visit (INDEPENDENT_AMBULATORY_CARE_PROVIDER_SITE_OTHER): Payer: Medicare Other | Admitting: Internal Medicine

## 2011-02-17 ENCOUNTER — Encounter: Payer: Self-pay | Admitting: Internal Medicine

## 2011-02-17 DIAGNOSIS — I10 Essential (primary) hypertension: Secondary | ICD-10-CM

## 2011-02-17 DIAGNOSIS — I4891 Unspecified atrial fibrillation: Secondary | ICD-10-CM

## 2011-02-17 LAB — BASIC METABOLIC PANEL
CO2: 26 mEq/L (ref 19–32)
Calcium: 9.7 mg/dL (ref 8.4–10.5)
GFR: 78.19 mL/min (ref >60.00)
Potassium: 4.1 mEq/L (ref 3.5–5.1)
Sodium: 141 mEq/L (ref 135–145)

## 2011-02-17 LAB — CBC WITH DIFFERENTIAL/PLATELET
Basophils Relative: 0.4 % (ref 0.0–3.0)
HCT: 38.2 % (ref 36.0–46.0)
Hemoglobin: 13 g/dL (ref 12.0–15.0)
Lymphocytes Relative: 30 % (ref 12.0–46.0)
Lymphs Abs: 3 10*3/uL (ref 0.7–4.0)
MCHC: 33.9 g/dL (ref 30.0–36.0)
Monocytes Relative: 7.4 % (ref 3.0–12.0)
Neutro Abs: 6 10*3/uL (ref 1.4–7.7)
RBC: 3.94 Mil/uL (ref 3.87–5.11)

## 2011-02-17 MED ORDER — RIVAROXABAN 15 MG PO TABS: 15.0000 mg | ORAL_TABLET | Freq: Every day | ORAL | Status: DC

## 2011-02-17 NOTE — Patient Instructions (Addendum)
Your physician recommends that you schedule a follow-up appointment in: 3 months with Dr Johney Frame   Your physician recommends that you return for lab work today  BMP/cbc  Try the 15mg  Xarelto for one month  Call me back at [phone] and let me know how it does

## 2011-02-17 NOTE — Assessment & Plan Note (Signed)
Stable No changes today I have had a long discussion about xarelto.  I have no data to support 10mg  daily dosing however she has not tolerated 20mg  daily.  She is willing to try 15mg  daily.  I will check BMET and CBC today

## 2011-02-17 NOTE — Progress Notes (Signed)
Megan Hester is a pleasant 78 y.o. WF patient with a h/o paroxysmal atrial fibrillation who presents today for follow-up. Presently, she is doing well.  She has occasional afib, but feels that this is mostly controlled.  She had nasal and hemorrhoidal bleeding with xarelto 20mg  and therefore decreased xarelto to 10mg .  Her bleeding is much improved. Today, she denies symptoms of chest pain, shortness of breath, orthopnea, PND, lower extremity edema, dizziness, presyncope, syncope, or neurologic sequela.   Past Medical History  Diagnosis Date  . Macular degeneration   . Hypertension   . Paroxysmal atrial fibrillation   . 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 Arthritis    Past Surgical History  Procedure Date  . Breast lumpectomy 1999    RADIATION  . Cardiac catheterization 02/05/1999    HYPERDYNAMIC LEFT VENTRICLE WITH EF OF 75-80%  . Enucleation 2012    right eye    Current Outpatient Prescriptions  Medication Sig Dispense Refill  . Cholecalciferol (VITAMIN D) 1000 UNITS capsule Take 1,000 Units by mouth daily.        [name] Kitchen diltiazem (CARDIZEM CD) 240 MG 24 hr capsule Take 240 mg by mouth daily.        [name] Kitchen erythromycin ophthalmic ointment as directed.      . fexofenadine (ALLEGRA) 180 MG tablet Take 180 mg by mouth daily.        . fluticasone (FLONASE) 50 MCG/ACT nasal spray 2 sprays by Nasal route daily.        [name] Kitchen lisinopril-hydrochlorothiazide (PRINZIDE,ZESTORETIC) 20-25 MG per tablet Take by mouth. 1/2 tablet  DAILY      . metoprolol (TOPROL-XL) 50 MG 24 hr tablet Take 25 mg by mouth 2 (two) times daily.       . Multiple Vitamins-Minerals (CENTRUM SILVER PO) Take by mouth.       . propranolol (INDERAL) 10 MG tablet Take 1 tablet (10 mg total) by mouth as needed (may take one tablet 3-4 times in a day, call physician with symptoms.).  30 tablet  1  . Rivaroxaban (XARELTO) 20 MG TABS Take 20 mg by mouth daily.  30 tablet  6  . STUDY MEDICATION Study Drug AREDS  ATS 50 LUT/ZEA 08,EPA/DHA 30        Allergies  Allergen Reactions  . Ceclor (Cefaclor)   . Codeine   . Contrast Media (Iodinated Diagnostic Agents)   . Coumadin   . Doxycycline   . Nalfon   . Oxycodone   . Penicillins   . Shellfish Allergy   . Sulfa Drugs Cross Reactors     History   Social History  . Marital Status: Married    Spouse Name: N/A    Number of Children: 3  . Years of Education: N/A   Occupational History  .     Social History Main Topics  . Smoking status: Never Smoker   . Smokeless tobacco: Not on file  . Alcohol Use: No  . Drug Use: No  . Sexually Active:    Other Topics Concern  . 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    Family History  Problem Relation Age of Onset  . Hypertension Father   . Diabetes Brother   . Heart disease Brother   . Stroke Mother   . Heart attack Father   . Parkinsonism Father    Physical Exam: Filed Vitals:   02/17/11  1123  BP: 119/72  Pulse: 57  Height: 5\' 4"  (1.626 m)  Weight: 191 lb (86.637 kg)    GEN- The patient is well appearing, alert and oriented x 3 today.   Head- normocephalic, atraumatic Eyes-  Prosthetic R eye Ears- hearing intact Oropharynx- clear Neck- supple, no JVP Lymph- no cervical lymphadenopathy Lungs- Clear to ausculation bilaterally, normal work of breathing Heart- Regular rate and rhythm, no murmurs, rubs or gallops, PMI not laterally displaced GI- soft, NT, ND, + BS Extremities- no clubbing, cyanosis, or edema MS- no significant deformity or atrophy Skin- no rash or lesion Psych- euthymic mood, full affect Neuro- strength and sensation are intact  Assessment and Plan:

## 2011-02-17 NOTE — Assessment & Plan Note (Signed)
Stable No change required today  

## 2011-03-19 ENCOUNTER — Other Ambulatory Visit: Payer: Self-pay | Admitting: *Deleted

## 2011-03-19 MED ORDER — DILTIAZEM HCL ER COATED BEADS 240 MG PO CP24: 240.0000 mg | ORAL_CAPSULE | Freq: Every day | ORAL | Status: DC

## 2011-04-01 ENCOUNTER — Other Ambulatory Visit: Payer: Self-pay | Admitting: Cardiovascular Disease

## 2011-04-01 MED ORDER — METOPROLOL TARTRATE 50 MG PO TABS: ORAL_TABLET | ORAL | Status: DC

## 2011-04-01 NOTE — Telephone Encounter (Signed)
Fax Received. Refill Completed. Cheston Coury Chowoe (R.M.A)   

## 2011-04-10 DIAGNOSIS — IMO0002 Reserved for concepts with insufficient information to code with codable children: Secondary | ICD-10-CM | POA: Insufficient documentation

## 2011-04-10 DIAGNOSIS — H353123 Nonexudative age-related macular degeneration, left eye, advanced atrophic without subfoveal involvement: Secondary | ICD-10-CM | POA: Insufficient documentation

## 2011-05-22 ENCOUNTER — Ambulatory Visit (INDEPENDENT_AMBULATORY_CARE_PROVIDER_SITE_OTHER): Payer: Medicare Other | Admitting: Internal Medicine

## 2011-05-22 ENCOUNTER — Encounter: Payer: Self-pay | Admitting: Internal Medicine

## 2011-05-22 VITALS — BP 136/81 | HR 65 | Resp 18 | Wt 193.8 lb

## 2011-05-22 DIAGNOSIS — I10 Essential (primary) hypertension: Secondary | ICD-10-CM

## 2011-05-22 DIAGNOSIS — I4891 Unspecified atrial fibrillation: Secondary | ICD-10-CM

## 2011-05-22 LAB — BASIC METABOLIC PANEL
BUN: 17 mg/dL (ref 6–23)
CO2: 25 mEq/L (ref 19–32)
Chloride: 106 mEq/L (ref 96–112)
Creatinine, Ser: 0.7 mg/dL (ref 0.4–1.2)
Glucose, Bld: 93 mg/dL (ref 70–99)

## 2011-05-22 LAB — CBC WITH DIFFERENTIAL/PLATELET
Basophils Absolute: 0 10*3/uL (ref 0.0–0.1)
Basophils Relative: 0.2 % (ref 0.0–3.0)
Eosinophils Absolute: 0.1 10*3/uL (ref 0.0–0.7)
MCHC: 33 g/dL (ref 30.0–36.0)
MCV: 97.8 fl (ref 78.0–100.0)
Monocytes Absolute: 0.8 10*3/uL (ref 0.1–1.0)
Neutrophils Relative %: 65 % (ref 43.0–77.0)
Platelets: 230 10*3/uL (ref 150.0–400.0)
RDW: 14.7 % — ABNORMAL HIGH (ref 11.5–14.6)

## 2011-05-22 NOTE — Assessment & Plan Note (Signed)
Stable No change required today  Check CrCl and CBC today on xarelto

## 2011-05-22 NOTE — Assessment & Plan Note (Signed)
Stable No change required today  

## 2011-05-22 NOTE — Progress Notes (Signed)
Megan Hester is a pleasant 78 y.o. WF patient with a h/o paroxysmal atrial fibrillation who presents today for follow-up. Presently, she is doing well.  Her afib is well controlled.  Today, she denies symptoms of chest pain, shortness of breath, orthopnea, PND, lower extremity edema, dizziness, presyncope, syncope, or neurologic sequela.  She is tolerating xarelto without significant bleeding.  Past Medical History  Diagnosis Date  . Macular degeneration   . Hypertension   . Paroxysmal atrial fibrillation   . 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 Arthritis    Past Surgical History  Procedure Date  . Breast lumpectomy 1999    RADIATION  . Cardiac catheterization 02/05/1999    HYPERDYNAMIC LEFT VENTRICLE WITH EF OF 75-80%  . Enucleation 2012    right eye    Current Outpatient Prescriptions  Medication Sig Dispense Refill  . Cholecalciferol (VITAMIN D) 1000 UNITS capsule Take 1,000 Units by mouth daily.        [name] Kitchen diltiazem (CARDIZEM CD) 240 MG 24 hr capsule Take 1 capsule (240 mg total) by mouth daily.  30 capsule  6  . fexofenadine (ALLEGRA) 180 MG tablet Take 180 mg by mouth daily.        . fluticasone (FLONASE) 50 MCG/ACT nasal spray Place 1 spray into the nose daily.       [name] Kitchen lisinopril-hydrochlorothiazide (PRINZIDE,ZESTORETIC) 20-25 MG per tablet Take by mouth. 1/2 tablet  DAILY      . metoprolol (LOPRESSOR) 50 MG tablet take 1/2 tablet by mouth twice a day  30 tablet  5  . Multiple Vitamins-Minerals (CENTRUM SILVER PO) Take by mouth.       . propranolol (INDERAL) 10 MG tablet Take 1 tablet (10 mg total) by mouth as needed (may take one tablet 3-4 times in a day, call physician with symptoms.).  30 tablet  1  . Rivaroxaban (XARELTO) 15 MG TABS tablet Take 1 tablet (15 mg total) by mouth daily.  30 tablet  0  . STUDY MEDICATION Study Drug AREDS ATS 50 LUT/ZEA 08,EPA/DHA 30      . erythromycin ophthalmic ointment as directed.        Allergies  Allergen  Reactions  . Ceclor (Cefaclor)   . Codeine   . Contrast Media (Iodinated Diagnostic Agents)   . Coumadin   . Doxycycline   . Nalfon   . Oxycodone   . Penicillins   . Shellfish Allergy   . Sulfa Drugs Cross Reactors     History   Social History  . Marital Status: Married    Spouse Name: N/A    Number of Children: 3  . Years of Education: N/A   Occupational History  .     Social History Main Topics  . Smoking status: Never Smoker   . Smokeless tobacco: Not on file  . Alcohol Use: No  . Drug Use: No  . Sexually Active:    Other Topics Concern  . 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    Family History  Problem Relation Age of Onset  . Hypertension Father   . Diabetes Brother   . Heart disease Brother   . Stroke Mother   . Heart attack Father   . Parkinsonism Father    Physical Exam: Filed Vitals:   05/22/11 0918  BP: 136/81  Pulse: 65  Resp: 18  Weight: 193 lb 12.8 oz (87.907  kg)    GEN- The patient is well appearing, alert and oriented x 3 today.   Head- normocephalic, atraumatic Eyes-  Prosthetic R eye Ears- hearing intact Oropharynx- clear Neck- supple, no JVP Lymph- no cervical lymphadenopathy Lungs- Clear to ausculation bilaterally, normal work of breathing Heart- Regular rate and rhythm, no murmurs, rubs or gallops, PMI not laterally displaced GI- soft, NT, ND, + BS Extremities- no clubbing, cyanosis, or edema MS- no significant deformity or atrophy Skin- no rash or lesion Psych- euthymic mood, full affect Neuro- strength and sensation are intact  ekg today reveals sinus rhythm 62 bpm, PR 208, incomplete RBBB  Assessment and Plan:

## 2011-05-22 NOTE — Patient Instructions (Signed)
Your physician wants you to follow-up in: 6 months with Dr Allred You will receive a reminder letter in the mail two months in advance. If you don't receive a letter, please call our office to schedule the follow-up appointment.  Your physician recommends that you return for lab work today BMP/CBC   

## 2011-05-27 ENCOUNTER — Other Ambulatory Visit: Payer: Self-pay

## 2011-05-27 DIAGNOSIS — I4891 Unspecified atrial fibrillation: Secondary | ICD-10-CM

## 2011-05-27 DIAGNOSIS — I10 Essential (primary) hypertension: Secondary | ICD-10-CM

## 2011-05-27 MED ORDER — RIVAROXABAN 15 MG PO TABS: 15.0000 mg | ORAL_TABLET | Freq: Every day | ORAL | Status: DC

## 2011-05-27 NOTE — Telephone Encounter (Signed)
.   Requested Prescriptions   Signed Prescriptions Disp Refills  . Rivaroxaban (XARELTO) 15 MG TABS tablet 30 tablet 0    Sig: Take 1 tablet (15 mg total) by mouth daily.    Authorizing Provider: Hillis Range    Ordering User: Lacie Scotts

## 2011-06-25 ENCOUNTER — Telehealth: Payer: Self-pay | Admitting: Internal Medicine

## 2011-06-25 ENCOUNTER — Ambulatory Visit (INDEPENDENT_AMBULATORY_CARE_PROVIDER_SITE_OTHER): Payer: Medicare Other | Admitting: *Deleted

## 2011-06-25 DIAGNOSIS — I4891 Unspecified atrial fibrillation: Secondary | ICD-10-CM

## 2011-06-25 MED ORDER — METOPROLOL TARTRATE 12.5 MG HALF TABLET: ORAL_TABLET | ORAL | Status: DC

## 2011-06-25 NOTE — Telephone Encounter (Signed)
EKG NSR  HR 70    Decrease Metoprolol to 12.5mg  bid

## 2011-06-25 NOTE — Patient Instructions (Signed)
Pt to decreaase Metoprolol to 12.5mg  bid

## 2011-06-25 NOTE — Telephone Encounter (Signed)
Tried to call her cell number but it did not work and lmom at home number

## 2011-06-25 NOTE — Telephone Encounter (Signed)
Discussed with Dr Johney Frame  To come by now for an EKG  May stop Metoprolol depending on her rhythm

## 2011-06-25 NOTE — Telephone Encounter (Signed)
Lately had days that she felt like she was going to fall down and breathing bad BP 107/63  HR 47

## 2011-06-25 NOTE — Telephone Encounter (Signed)
Bp low and not feeling well, pls advise or cell @ [phone]

## 2011-06-26 ENCOUNTER — Telehealth: Payer: Self-pay | Admitting: Internal Medicine

## 2011-06-26 MED ORDER — METOPROLOL TARTRATE 25 MG PO TABS: ORAL_TABLET | ORAL | Status: DC

## 2011-06-26 NOTE — Telephone Encounter (Signed)
Received a fax from the pharmacy, stating they do no make a 12.5 MG TABLETS, spoke with Ledon Snare RN, she stated she had spoke with Dr. Johney Frame, stated the pt was supposed to take a 6.25 MG BID.  Called in a 25 MG TABLET, pt to take 1/4 TABLET BID to [id number].  Called the pt and explained she would have to take a 1/4 of a TABLET two times daily, pt stated that would be fine.

## 2011-06-27 ENCOUNTER — Other Ambulatory Visit: Payer: Self-pay | Admitting: Internal Medicine

## 2011-06-27 DIAGNOSIS — I10 Essential (primary) hypertension: Secondary | ICD-10-CM

## 2011-06-27 DIAGNOSIS — I4891 Unspecified atrial fibrillation: Secondary | ICD-10-CM

## 2011-06-27 MED ORDER — RIVAROXABAN 15 MG PO TABS: 15.0000 mg | ORAL_TABLET | Freq: Every day | ORAL | Status: DC

## 2011-06-28 NOTE — Telephone Encounter (Signed)
She is supposed to have reduced her dose to 12.5mg  BID (1/2 of a 25mg  tablet).  Please clarify with patient.

## 2011-07-04 NOTE — Telephone Encounter (Signed)
Called pt and explained to take a 1/2 tablet, Metoprolol 25 MG BID.  Pt understands and will start taking a 1/2 tablet, Metoprolol 25 MG BID.

## 2011-07-24 ENCOUNTER — Telehealth: Payer: Self-pay | Admitting: Internal Medicine

## 2011-07-24 MED ORDER — METOPROLOL TARTRATE 25 MG PO TABS: ORAL_TABLET | ORAL | Status: DC

## 2011-07-24 NOTE — Telephone Encounter (Signed)
Please return call to patient at [phone] regarding dosage instructions.

## 2011-07-24 NOTE — Telephone Encounter (Signed)
Take 1/2 tablet twice daily Rx fixed in computer

## 2011-08-25 ENCOUNTER — Telehealth: Payer: Self-pay | Admitting: Internal Medicine

## 2011-08-25 NOTE — Telephone Encounter (Signed)
New problem:  Per Toni Amend, patient need to stop xarelto. Due to procedure on 7/25.

## 2011-08-25 NOTE — Telephone Encounter (Signed)
She should stop xarelto for no more than 48 hours prior to the procedure and restart 12-24 hours after the procedure. Holding the medicine for 7 days prior to the procedure would not be appropriate.

## 2011-08-25 NOTE — Telephone Encounter (Signed)
Patient is having a Colonoscopy on 7/25  The doctor wants her to stop 7 days prior to procedure Please advise

## 2011-08-27 ENCOUNTER — Telehealth: Payer: Self-pay | Admitting: Internal Medicine

## 2011-08-27 NOTE — Telephone Encounter (Signed)
Number taken yesterday was wrong number   Fax number ends in [phone].  Kim will refax Toni Amend is aware

## 2011-08-27 NOTE — Telephone Encounter (Signed)
New problem:  Per Toni Amend, need something in written regarding cardiac clearance

## 2011-10-15 ENCOUNTER — Other Ambulatory Visit: Payer: Self-pay | Admitting: *Deleted

## 2011-10-15 MED ORDER — DILTIAZEM HCL ER COATED BEADS 240 MG PO CP24: 240.0000 mg | ORAL_CAPSULE | Freq: Every day | ORAL | Status: DC

## 2011-11-03 ENCOUNTER — Other Ambulatory Visit: Payer: Self-pay | Admitting: Internal Medicine

## 2011-11-03 DIAGNOSIS — Z1231 Encounter for screening mammogram for malignant neoplasm of breast: Secondary | ICD-10-CM

## 2011-11-17 ENCOUNTER — Encounter: Payer: Self-pay | Admitting: Internal Medicine

## 2011-11-17 ENCOUNTER — Ambulatory Visit (INDEPENDENT_AMBULATORY_CARE_PROVIDER_SITE_OTHER): Payer: Medicare Other | Admitting: Internal Medicine

## 2011-11-17 VITALS — BP 120/70 | HR 71 | Ht 64.0 in | Wt 190.0 lb

## 2011-11-17 DIAGNOSIS — R0602 Shortness of breath: Secondary | ICD-10-CM

## 2011-11-17 DIAGNOSIS — I10 Essential (primary) hypertension: Secondary | ICD-10-CM

## 2011-11-17 DIAGNOSIS — I4891 Unspecified atrial fibrillation: Secondary | ICD-10-CM

## 2011-11-17 NOTE — Assessment & Plan Note (Signed)
Stable Anticoagulated with xarelto Given her advanced age and risks of bleeding, I will enroll her in our anticoagulation clinic for periodic assessment.

## 2011-11-17 NOTE — Patient Instructions (Signed)
Your physician wants you to follow-up in: 12 months with Dr Jacquiline Doe will receive a reminder letter in the mail two months in advance. If you don't receive a letter, please call our office to schedule the follow-up appointment.   Your physician has requested that you have an echocardiogram. Echocardiography is a painless test that uses sound waves to create images of your heart. It provides your doctor with information about the size and shape of your heart and how well your heart's chambers and valves are working. This procedure takes approximately one hour. There are no restrictions for this procedure.   Your physician has requested that you have en exercise stress myoview. For further information please visit [email or web address] Please follow instruction sheet, as given.  You have been referred to Weston Brass, Pharm D in 6 weeks for anti coag visit for Xarelto

## 2011-11-17 NOTE — Assessment & Plan Note (Signed)
Stable No change required today  

## 2011-11-17 NOTE — Assessment & Plan Note (Signed)
She has exertional SOB.  I will order an echo and gxt myoview to evaluate for CV cause.

## 2011-11-17 NOTE — Progress Notes (Signed)
PCP: Minda Meo, MD  Megan Hester is a 78 y.o. female with a h/o paroxysmal atrial fibrillation who presents today for follow-up. Presently, she is doing well.  Her husband recently had surgery and she has noticed several episodes of afib when caring for him.  Her primary concern today is with exertional shortness of breath.  She finds that with moderate activity, she becomes SOB.  She denies CP.   Today, she denies symptoms of orthopnea, PND, lower extremity edema, dizziness, presyncope, syncope, or neurologic sequela.  She is tolerating xarelto without significant bleeding.  Past Medical History  Diagnosis Date  . Macular degeneration   . Hypertension   . Paroxysmal atrial fibrillation   . 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 Arthritis    Past Surgical History  Procedure Date  . Breast lumpectomy 1999    RADIATION  . Cardiac catheterization 02/05/1999    HYPERDYNAMIC LEFT VENTRICLE WITH EF OF 75-80%  . Enucleation 2012    right eye    Current Outpatient Prescriptions  Medication Sig Dispense Refill  . Cholecalciferol (VITAMIN D) 1000 UNITS capsule Take 1,000 Units by mouth daily.        [name] Kitchen diltiazem (CARDIZEM CD) 240 MG 24 hr capsule Take 1 capsule (240 mg total) by mouth daily.  30 capsule  6  . erythromycin ophthalmic ointment as directed.      . fexofenadine (ALLEGRA) 180 MG tablet Take 180 mg by mouth daily.        . fluticasone (FLONASE) 50 MCG/ACT nasal spray Place 1 spray into the nose daily.       [name] Kitchen lisinopril-hydrochlorothiazide (PRINZIDE,ZESTORETIC) 20-25 MG per tablet Take by mouth. 1/2 tablet  DAILY      . metoprolol tartrate (LOPRESSOR) 25 MG tablet Take 1/2 tablet twice daily  30 tablet  6  . Multiple Vitamins-Minerals (CENTRUM SILVER PO) Take by mouth.       . propranolol (INDERAL) 10 MG tablet Take 1 tablet (10 mg total) by mouth as needed (may take one tablet 3-4 times in a day, call physician with symptoms.).  30 tablet  1  .  Rivaroxaban (XARELTO) 15 MG TABS tablet Take 1 tablet (15 mg total) by mouth daily.  30 tablet  6  . STUDY MEDICATION Study Drug AREDS ATS 50 LUT/ZEA 08,EPA/DHA 30        Allergies  Allergen Reactions  . Ceclor (Cefaclor)   . Codeine   . Contrast Media (Iodinated Diagnostic Agents)   . Doxycycline   . Fenoprofen Calcium   . Oxycodone   . Penicillins   . Shellfish Allergy   . Sulfa Drugs Cross Reactors   . Warfarin Sodium     History   Social History  . Marital Status: Married    Spouse Name: N/A    Number of Children: 3  . Years of Education: N/A   Occupational History  .     Social History Main Topics  . Smoking status: Never Smoker   . Smokeless tobacco: Not on file  . Alcohol Use: No  . Drug Use: No  . Sexually Active:    Other Topics Concern  . 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    Family History  Problem Relation Age of Onset  . Hypertension Father   . Diabetes Brother   . Heart disease Brother   . Stroke Mother   .  Heart attack Father   . Parkinsonism Father    Physical Exam: Filed Vitals:   11/17/11 1416  BP: 120/70  Pulse: 71  Height: 5\' 4"  (1.626 m)  Weight: 190 lb (86.183 kg)    GEN- The patient is well appearing, alert and oriented x 3 today.   Head- normocephalic, atraumatic Eyes-  Prosthetic R eye Ears- hearing intact Oropharynx- clear Neck- supple, no JVP Lymph- no cervical lymphadenopathy Lungs- Clear to ausculation bilaterally, normal work of breathing Heart- Regular rate and rhythm, no murmurs, rubs or gallops, PMI not laterally displaced GI- soft, NT, ND, + BS Extremities- no clubbing, cyanosis, or edema Neuro- strength and sensation are intact  ekg today reveals sinus rhythm 71 bpm, PR 194, incomplete RBBB  Assessment and Plan:

## 2011-11-20 ENCOUNTER — Ambulatory Visit: Payer: Medicare Other | Admitting: Internal Medicine

## 2011-11-24 ENCOUNTER — Ambulatory Visit (HOSPITAL_COMMUNITY): Payer: Medicare Other | Attending: Internal Medicine | Admitting: Radiology

## 2011-11-24 DIAGNOSIS — I10 Essential (primary) hypertension: Secondary | ICD-10-CM | POA: Insufficient documentation

## 2011-11-24 DIAGNOSIS — R0989 Other specified symptoms and signs involving the circulatory and respiratory systems: Secondary | ICD-10-CM | POA: Insufficient documentation

## 2011-11-24 DIAGNOSIS — R0602 Shortness of breath: Secondary | ICD-10-CM

## 2011-11-24 DIAGNOSIS — R0609 Other forms of dyspnea: Secondary | ICD-10-CM | POA: Insufficient documentation

## 2011-11-24 DIAGNOSIS — I379 Nonrheumatic pulmonary valve disorder, unspecified: Secondary | ICD-10-CM | POA: Insufficient documentation

## 2011-11-24 DIAGNOSIS — I4891 Unspecified atrial fibrillation: Secondary | ICD-10-CM | POA: Insufficient documentation

## 2011-11-24 DIAGNOSIS — I059 Rheumatic mitral valve disease, unspecified: Secondary | ICD-10-CM | POA: Insufficient documentation

## 2011-11-24 NOTE — Progress Notes (Signed)
Echocardiogram performed.  

## 2011-11-25 ENCOUNTER — Telehealth (HOSPITAL_COMMUNITY): Payer: Self-pay | Admitting: *Deleted

## 2011-11-26 ENCOUNTER — Ambulatory Visit (HOSPITAL_COMMUNITY): Payer: Medicare Other | Attending: Cardiology | Admitting: Radiology

## 2011-11-26 VITALS — BP 106/69 | Ht 64.0 in | Wt 190.0 lb

## 2011-11-26 DIAGNOSIS — R0989 Other specified symptoms and signs involving the circulatory and respiratory systems: Secondary | ICD-10-CM | POA: Insufficient documentation

## 2011-11-26 DIAGNOSIS — R0602 Shortness of breath: Secondary | ICD-10-CM | POA: Insufficient documentation

## 2011-11-26 DIAGNOSIS — R0609 Other forms of dyspnea: Secondary | ICD-10-CM | POA: Insufficient documentation

## 2011-11-26 DIAGNOSIS — I10 Essential (primary) hypertension: Secondary | ICD-10-CM | POA: Insufficient documentation

## 2011-11-26 DIAGNOSIS — Z8249 Family history of ischemic heart disease and other diseases of the circulatory system: Secondary | ICD-10-CM | POA: Insufficient documentation

## 2011-11-26 MED ORDER — TECHNETIUM TC 99M SESTAMIBI GENERIC - CARDIOLITE
30.0000 | Freq: Once | INTRAVENOUS | Status: AC | PRN
  Administered 2011-11-26: 30 via INTRAVENOUS

## 2011-11-26 MED ORDER — REGADENOSON 0.4 MG/5ML IV SOLN
0.4000 mg | Freq: Once | INTRAVENOUS | Status: AC
  Administered 2011-11-26: 0.4 mg via INTRAVENOUS

## 2011-11-26 MED ORDER — TECHNETIUM TC 99M SESTAMIBI GENERIC - CARDIOLITE
10.0000 | Freq: Once | INTRAVENOUS | Status: AC | PRN
  Administered 2011-11-26: 10 via INTRAVENOUS

## 2011-11-26 NOTE — Progress Notes (Signed)
[facility] SITE 3 NUCLEAR MED [address] [id number] [place] [address] [id number]  Cardiology Nuclear Med Study  Megan Hester is a 78 y.o. female     MRN : [id number]     DOB: 1931/02/07  Procedure Date: 11/26/2011  Nuclear Med Background Indication for Stress Test:  Evaluation for Ischemia History:  PAF, 2000: Heart Cath: NL Coronaries with hyperdynamic LV EF: 75-80%, 11/24/11 ECHO Cardiac Risk Factors: Family History - CAD and Hypertension  Symptoms:  DOE and SOB   Nuclear Pre-Procedure Caffeine/Decaff Intake:  None NPO After: 8:30pm   Lungs:  clear O2 Sat: 95% on room air. IV 0.9% NS with Angio Cath:  22g  IV Site: L Hand  IV Started by:  Bonnita Levan, RN  Chest Size (in):  36 Cup Size: C  Height: 5\' 4"  (1.626 m)  Weight:  190 lb (86.183 kg)  BMI:  Body mass index is 32.61 kg/(m^2). Tech Comments:  Patient took Lopressor this AM per Dr. Ladona Ridgel. This patient walked a very short 3 minutes and was sob and unable to keep up with the treadmill. The patient was changed to a walking Lexiscan.     Nuclear Med Study 1 or 2 day study: 1 day  Stress Test Type:  Stress  Reading MD: Cassell Clement, MD  Order Authorizing Provider:  Hillis Range, MD  Resting Radionuclide: Technetium 5m Sestamibi  Resting Radionuclide Dose: 11.0 mCi   Stress Radionuclide:  Technetium 67m Sestamibi  Stress Radionuclide Dose: 33.0 mCi           Stress Protocol Rest HR: 61 Stress HR: 96  Rest BP: 106/69 Stress BP: 162/79  Exercise Time (min): 5:31 METS: 4.60   Predicted Max HR: 142 bpm % Max HR: 67.61 bpm Rate Pressure Product: 62952   Dose of Adenosine (mg):  n/a Dose of Lexiscan: 0.4 mg  Dose of Atropine (mg): n/a Dose of Dobutamine: n/a mcg/kg/min (at max HR)  Stress Test Technologist: Milana Na, EMT-P  Nuclear Technologist:  Domenic Polite, CNMT     Rest Procedure:  Myocardial perfusion imaging was performed at rest 45 minutes following the  intravenous administration of Technetium 63m Sestamibi. Rest ECG: NSR - Normal EKG  Stress Procedure:  The patient received IV Lexiscan 0.4 mg over 15-seconds with concurrent low level exercise and then Technetium 8m Sestamibi was injected at 30-seconds while the patient continued walking one more minute. There were non specific changes and sob with Lexiscan. Quantitative spect images were obtained after a 45-minute delay. Stress ECG: Non Specific ST T changes  QPS Raw Data Images:  Normal; no motion artifact; normal heart/lung ratio. Stress Images:  There is decreased uptake in the apex. Rest Images:  There is decreased uptake in the apex. Subtraction (SDS):  No reversibility is appreciated. Transient Ischemic Dilatation (Normal <1.22):  0.93 Lung/Heart Ratio (Normal <0.45):  0.26  Quantitative Gated Spect Images QGS EDV:  68 ml QGS ESV:  13 ml  Impression Exercise Capacity:  Lexiscan with low level exercise. BP Response:  Normal blood pressure response. Clinical Symptoms:  No significant symptoms noted. ECG Impression:  No significant ST segment change suggestive of ischemia. Comparison with Prior Nuclear Study: No images to compare  Overall Impression:  Low risk stress nuclear study. There is a very small area of decreased uptake at apex on rest and stress images consistent with apical thinning. Wall motion is vigorous.  LV Ejection Fraction: 80%.  LV Wall Motion:  NL LV Function;  NL Wall Motion  [employer]

## 2011-12-10 ENCOUNTER — Ambulatory Visit
Admission: RE | Admit: 2011-12-10 | Discharge: 2011-12-10 | Disposition: A | Discharge status: Home / Self Care | Payer: Medicare Other | Source: Ambulatory Visit | Attending: Internal Medicine | Admitting: Internal Medicine

## 2011-12-10 DIAGNOSIS — Z1231 Encounter for screening mammogram for malignant neoplasm of breast: Secondary | ICD-10-CM

## 2011-12-30 ENCOUNTER — Ambulatory Visit: Payer: Medicare Other | Admitting: Pharmacist

## 2011-12-30 ENCOUNTER — Ambulatory Visit (INDEPENDENT_AMBULATORY_CARE_PROVIDER_SITE_OTHER): Payer: Medicare Other | Admitting: *Deleted

## 2011-12-30 DIAGNOSIS — I4891 Unspecified atrial fibrillation: Secondary | ICD-10-CM

## 2011-12-30 LAB — CBC
MCV: 97.7 fl (ref 78.0–100.0)
RBC: 4.1 Mil/uL (ref 3.87–5.11)
WBC: 9 10*3/uL (ref 4.5–10.5)

## 2011-12-30 LAB — BASIC METABOLIC PANEL
GFR: 91.8 mL/min (ref >60.00)
Glucose, Bld: 106 mg/dL — ABNORMAL HIGH (ref 70–99)
Potassium: 3.9 mEq/L (ref 3.5–5.1)
Sodium: 136 mEq/L (ref 135–145)

## 2011-12-30 NOTE — Progress Notes (Signed)
Pt was started on Xarelto for Afib on 09/12/10.    Reviewed patients medication list.  Pt is not currently on any combined P-gp and strong CYP3A4 inhibitors/inducers (ketoconazole, traconazole, ritonavir, carbamazepine, phenytoin, rifampin, St. John's wort).  Reviewed labs.  SCr  0.7, Weight 86.18 Kg, CrCl- 87.  Dose is appropriate based on CrCl. Pt and Dr Johney Frame decided on lower dose of 15mg s QD.  Hgb and HCT 13.2/40.0 .  A full discussion of the nature of anticoagulants has been carried out.  A benefit/risk analysis has been presented to the patient, so that they understand the justification for choosing anticoagulation with Xarelto at this time.  The need for compliance is stressed.  Pt is aware to take the medication once daily with the largest meal of the day.  Side effects of potential bleeding are discussed, including unusual colored urine or stools, coughing up blood or coffee ground emesis, nose bleeds or serious fall or head trauma.  Discussed signs and symptoms of stroke. The patient should avoid any OTC items containing aspirin or ibuprofen.  Avoid alcohol consumption.   Call if any signs of abnormal bleeding.  Discussed financial obligations and resolved any difficulty in obtaining medication.  Next lab test test in 6 months.

## 2012-01-20 ENCOUNTER — Other Ambulatory Visit: Payer: Self-pay | Admitting: *Deleted

## 2012-01-20 DIAGNOSIS — I4891 Unspecified atrial fibrillation: Secondary | ICD-10-CM

## 2012-01-20 DIAGNOSIS — I10 Essential (primary) hypertension: Secondary | ICD-10-CM

## 2012-01-20 MED ORDER — RIVAROXABAN 15 MG PO TABS: 15.0000 mg | ORAL_TABLET | Freq: Every day | ORAL | Status: DC

## 2012-01-22 ENCOUNTER — Other Ambulatory Visit: Payer: Self-pay | Admitting: *Deleted

## 2012-01-22 MED ORDER — PROPRANOLOL HCL 10 MG PO TABS: 10.0000 mg | ORAL_TABLET | ORAL | Status: DC | PRN

## 2012-01-22 NOTE — Telephone Encounter (Signed)
Fax Received. Refill Completed. Alfredo Spong Chowoe (R.M.A)   

## 2012-02-17 ENCOUNTER — Other Ambulatory Visit: Payer: Self-pay | Admitting: Internal Medicine

## 2012-02-18 ENCOUNTER — Other Ambulatory Visit: Payer: Self-pay | Admitting: *Deleted

## 2012-02-18 MED ORDER — METOPROLOL TARTRATE 25 MG PO TABS: ORAL_TABLET | ORAL | Status: DC

## 2012-02-18 NOTE — Telephone Encounter (Signed)
Refilled Metoprolol

## 2012-02-26 ENCOUNTER — Telehealth: Payer: Self-pay | Admitting: Internal Medicine

## 2012-02-26 NOTE — Telephone Encounter (Signed)
New problem:   Receive a letter from [employer] [facility]. Stating They will not pay for xarleto. Need a statement supposing this.

## 2012-02-26 NOTE — Telephone Encounter (Signed)
Will need to try and get patient Xarelto and send in supporting documents to help get the medication paid for.  She is going to bring the letter she received in the mail so I can try and help her

## 2012-03-09 NOTE — Telephone Encounter (Signed)
Xarelto approved for 12 months  Patient is aware

## 2012-03-27 ENCOUNTER — Other Ambulatory Visit: Payer: Self-pay

## 2012-04-23 ENCOUNTER — Encounter: Payer: Self-pay | Admitting: Gynecology

## 2012-04-23 ENCOUNTER — Ambulatory Visit: Payer: Medicare Other | Admitting: Gynecology

## 2012-04-23 VITALS — BP 124/80 | Ht 64.0 in | Wt 184.0 lb

## 2012-04-23 DIAGNOSIS — N9089 Other specified noninflammatory disorders of vulva and perineum: Secondary | ICD-10-CM

## 2012-04-23 DIAGNOSIS — K644 Residual hemorrhoidal skin tags: Secondary | ICD-10-CM

## 2012-04-23 DIAGNOSIS — K649 Unspecified hemorrhoids: Secondary | ICD-10-CM

## 2012-04-23 DIAGNOSIS — N8111 Cystocele, midline: Secondary | ICD-10-CM

## 2012-04-23 DIAGNOSIS — N898 Other specified noninflammatory disorders of vagina: Secondary | ICD-10-CM

## 2012-04-23 DIAGNOSIS — L293 Anogenital pruritus, unspecified: Secondary | ICD-10-CM

## 2012-04-23 LAB — WET PREP FOR TRICH, YEAST, CLUE: Yeast Wet Prep HPF POC: NONE SEEN

## 2012-04-23 MED ORDER — CLINDAMYCIN PHOSPHATE 2 % VA CREA: 1.0000 | TOPICAL_CREAM | Freq: Every day | VAGINAL | Status: DC

## 2012-04-23 NOTE — Patient Instructions (Signed)
Office will call you with the biopsy results 

## 2012-04-23 NOTE — Addendum Note (Signed)
Addended by: Dayna Barker on: 04/23/2012 03:23 PM   Modules accepted: Orders

## 2012-04-23 NOTE — Progress Notes (Signed)
Patient ID: Megan Hester, female   DOB: 28-Sep-1930, 79 y.o.   MRN: [id number]   79 y.o.  G3P3003 new patient with issues noted below.  Normally gets all of her health care through Dr. Jacky Kindle office where he does a full exam to include breast exam. She is within a year of this and presents for the specific complaints below.  Past medical history,surgical history, medications, allergies, family history and social history were all reviewed and documented in the EPIC chart. ROS:  Was performed and pertinent positives and negatives are included in the history.  Exam: Kim assistant Filed Vitals:   04/23/12 1356  BP: 124/80  Height: 5\' 4"  (1.626 m)  Weight: 184 lb (83.462 kg)   General appearance  Normal Skin grossly normal Abdominal  soft, nontender, without masses or organomegaly. Small finger width reducible umbilical hernia Pelvic  Ext/BUS/vagina  generalized atrophic changes with whitish pearly skin changes periclitoraly along right upper labia majora down to mid labia. Inner labia minora with thickened hyperkeratotic white plaque.  Small classic sebaceous cyst mid right labia majora. Slight white discharge noted. Mild cystocele with straining. Physical Exam  Genitourinary:        Cervix  normal with atrophic changes mild descent with straining  Uterus  anteverted, normal size, shape and contour, midline and mobile nontender   Adnexa  Without masses or tenderness    Anus and perineum  normal   Rectovaginal  normal sphincter tone without palpated masses or tenderness. First to second-degree rectocele on exam. Multiple old external hemorrhoids   Assessment/Plan:  79 y.o. G3P3003 new patient   1. Chronic vulvar pruritus and most acutely dyspareunia with superficial irritation and pain. Last 2 episodes of intercourse lead to some vaginal bleeding. She had used over-the-counter yeast creams which did not seem effective. Exam today shows generalized atrophic changes with white pearly  skin changes around the clitoris and primarily going along the right labia majora although a separate area of hyperkeratotic white plaque on the inner labia minora consistent with lichen sclerosus. 2 representative biopsies taken as noted above. Also has small benign appearing classic sebaceous cyst mid right labia majora. Reviewed all this with the patient.  Will await biopsy results but plan on using Temovate 0.05% cream nightly for one month and return for reevaluation.  The issues of the atrophy and possible estrogen supplementation was also reviewed. 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 She had been on estrogen at the time. The issues of vaginal estrogen absorption in a patient with history of breast cancer possible reactivation of metastatic disease or new secondary was reviewed. We'll readdress this after biopsy results and if Temovate cream used if ineffective then we'll revisit this issue.  Options to accept the risks and start such as Vagifem or have a medical oncology consult before hand was also reviewed. 2. Patient did have a slight white discharge with wet prep suggestive of a low level bacterial vaginosis we'll treat with Cleocin vaginal cream nightly x1 week also. 3. External hemorrhoids which are not overly bothersome to the patient and we'll continue to monitor. 4. Mild pelvic prolapse with mild cystocele, cervical descent and rectocele. She is asymptomatic from these and we'll continue to monitor. Patient will followup in a month for reexam and we'll go from there.

## 2012-04-23 NOTE — Addendum Note (Signed)
Addended by: Dara Lords on: 04/23/2012 03:09 PM   Modules accepted: Orders

## 2012-04-26 ENCOUNTER — Other Ambulatory Visit: Payer: Self-pay | Admitting: Gynecology

## 2012-04-26 MED ORDER — CLOBETASOL PROPIONATE 0.05 % EX CREA: TOPICAL_CREAM | Freq: Every day | CUTANEOUS | Status: DC

## 2012-05-19 ENCOUNTER — Other Ambulatory Visit: Payer: Self-pay | Admitting: Emergency Medicine

## 2012-05-19 MED ORDER — DILTIAZEM HCL ER COATED BEADS 240 MG PO CP24: 240.0000 mg | ORAL_CAPSULE | Freq: Every day | ORAL | Status: DC

## 2012-05-25 ENCOUNTER — Ambulatory Visit (INDEPENDENT_AMBULATORY_CARE_PROVIDER_SITE_OTHER): Payer: Medicare Other | Admitting: Gynecology

## 2012-05-25 ENCOUNTER — Encounter: Payer: Self-pay | Admitting: Gynecology

## 2012-05-25 DIAGNOSIS — N760 Acute vaginitis: Secondary | ICD-10-CM

## 2012-05-25 DIAGNOSIS — N762 Acute vulvitis: Secondary | ICD-10-CM

## 2012-05-25 NOTE — Patient Instructions (Signed)
Use the Temovate steroid cream as needed for irritation. Followup as needed.

## 2012-05-25 NOTE — Progress Notes (Signed)
Patient presents in followup of her intense vulvitis. She's been using Temovate cream after a one-week course of Cleocin vaginal cream. Notes that her symptoms are 100% better. Biopsy did show hyperkeratoses but no overt evidence of lichen sclerosis.  Exam was Megan Hester Pelvic external with atrophic changes. No acute inflammatory changes. Vagina atrophic.  Assessment and plan: Much improved vulvitis. Patient will wean herself from the Temovate and then use it intermittently as needed for symptoms. Patient will follow up routinely, sooner as needed.

## 2012-06-21 ENCOUNTER — Ambulatory Visit (INDEPENDENT_AMBULATORY_CARE_PROVIDER_SITE_OTHER): Payer: Medicare Other | Admitting: Pharmacist

## 2012-06-21 VITALS — Wt 185.2 lb

## 2012-06-21 DIAGNOSIS — I4891 Unspecified atrial fibrillation: Secondary | ICD-10-CM

## 2012-06-21 LAB — CBC
HCT: 35.6 % — ABNORMAL LOW (ref 36.0–46.0)
Hemoglobin: 12.3 g/dL (ref 12.0–15.0)
MCV: 95.7 fl (ref 78.0–100.0)
RDW: 14.4 % (ref 11.5–14.6)
WBC: 8 10*3/uL (ref 4.5–10.5)

## 2012-06-21 LAB — BASIC METABOLIC PANEL
CO2: 26 mEq/L (ref 19–32)
Calcium: 9.3 mg/dL (ref 8.4–10.5)
Creatinine, Ser: 0.7 mg/dL (ref 0.4–1.2)

## 2012-06-21 NOTE — Progress Notes (Signed)
Pt was started on Xarelto for atrial fibrillation on August 2012.    Reviewed patients medication list.  Pt is not currently on any combined P-gp and strong CYP3A4 inhibitors/inducers (ketoconazole, traconazole, ritonavir, carbamazepine, phenytoin, rifampin, St. John's wort).  Reviewed labs.  SCr 0.7, Weight 185 lb (84 kg), CrCl-60 ml/min.  Dose appropriate based on CrCl.   Hgb and HCT 12.3/35.6  A full discussion of the nature of anticoagulants has been carried out.  A benefit/risk analysis has been presented to the patient, so that they understand the justification for choosing anticoagulation with Xarelto at this time.  The need for compliance is stressed.  Pt is aware to take the medication once daily with the largest meal of the day.  Side effects of potential bleeding are discussed, including unusual colored urine or stools, coughing up blood or coffee ground emesis, nose bleeds or serious fall or head trauma.  Discussed signs and symptoms of stroke. The patient should avoid any OTC items containing aspirin or ibuprofen.  Avoid alcohol consumption.   Call if any signs of abnormal bleeding.  Discussed financial obligations and resolved any difficulty in obtaining medication.  Next lab test test in 6 months.

## 2012-08-18 ENCOUNTER — Other Ambulatory Visit: Payer: Self-pay

## 2012-08-18 DIAGNOSIS — I4891 Unspecified atrial fibrillation: Secondary | ICD-10-CM

## 2012-08-18 DIAGNOSIS — I10 Essential (primary) hypertension: Secondary | ICD-10-CM

## 2012-08-18 MED ORDER — RIVAROXABAN 15 MG PO TABS: 15.0000 mg | ORAL_TABLET | Freq: Every day | ORAL | Status: DC

## 2012-09-15 ENCOUNTER — Other Ambulatory Visit: Payer: Self-pay

## 2012-09-15 ENCOUNTER — Other Ambulatory Visit: Payer: Self-pay | Admitting: *Deleted

## 2012-09-15 MED ORDER — METOPROLOL TARTRATE 25 MG PO TABS: ORAL_TABLET | ORAL | Status: DC

## 2012-10-06 ENCOUNTER — Encounter: Payer: Self-pay | Admitting: Cardiovascular Disease

## 2012-10-20 ENCOUNTER — Encounter: Payer: Self-pay | Admitting: Cardiovascular Disease

## 2012-11-01 ENCOUNTER — Other Ambulatory Visit: Payer: Self-pay

## 2012-11-01 DIAGNOSIS — Z1231 Encounter for screening mammogram for malignant neoplasm of breast: Secondary | ICD-10-CM

## 2012-11-24 ENCOUNTER — Ambulatory Visit (INDEPENDENT_AMBULATORY_CARE_PROVIDER_SITE_OTHER): Payer: Medicare Other | Admitting: Internal Medicine

## 2012-11-24 ENCOUNTER — Encounter: Payer: Self-pay | Admitting: Internal Medicine

## 2012-11-24 VITALS — BP 128/76 | HR 64 | Ht 64.0 in | Wt 181.8 lb

## 2012-11-24 DIAGNOSIS — I10 Essential (primary) hypertension: Secondary | ICD-10-CM

## 2012-11-24 DIAGNOSIS — I4891 Unspecified atrial fibrillation: Secondary | ICD-10-CM

## 2012-11-24 NOTE — Patient Instructions (Signed)
Your physician wants you to follow-up in: 12 months with Dr Allred You will receive a reminder letter in the mail two months in advance. If you don't receive a letter, please call our office to schedule the follow-up appointment.  

## 2012-11-24 NOTE — Progress Notes (Signed)
PCP: Minda Meo, MD  Megan Hester is a 79 y.o. female with a h/o paroxysmal atrial fibrillation who presents today for follow-up. Presently, she is doing well.    She denies CP.  Her SOB is improved.  She reports having short episodes of afib every few weeks which resolves with propranolol.  She does not wish to consider AAD therapy at this time.   Today, she denies symptoms of orthopnea, PND, lower extremity edema, dizziness, presyncope, syncope, or neurologic sequela.  She is tolerating xarelto without significant bleeding.  Past Medical History  Diagnosis Date  . Macular degeneration   . Hypertension   . Paroxysmal atrial fibrillation   . 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 Arthritis   . Cancer 1999    Breast cancer--DCIS   Past Surgical History  Procedure Laterality Date  . Breast lumpectomy  1999    RADIATION  . Cardiac catheterization  02/05/1999    HYPERDYNAMIC LEFT VENTRICLE WITH EF OF 75-80%  . 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 Right eye removed      Current Outpatient Prescriptions  Medication Sig Dispense Refill  . Cholecalciferol (VITAMIN D) 1000 UNITS capsule Take 1,000 Units by mouth daily.        . clobetasol cream (TEMOVATE) 0.05 % Apply topically at bedtime.  30 g  1  . diltiazem (CARDIZEM CD) 240 MG 24 hr capsule Take 1 capsule (240 mg total) by mouth daily.  30 capsule  6  . fexofenadine (ALLEGRA) 180 MG tablet Take 180 mg by mouth daily.        . fluticasone (FLONASE) 50 MCG/ACT nasal spray Place 1 spray into the nose daily.       [name] Kitchen lisinopril-hydrochlorothiazide (PRINZIDE,ZESTORETIC) 20-25 MG per tablet Take by mouth. 1/2 tablet  DAILY      . metoprolol tartrate (LOPRESSOR) 25 MG tablet Take 1/2 tablet twice daily  30 tablet  6  . Multiple Vitamins-Minerals (CENTRUM SILVER PO) Take by mouth.       . Multiple Vitamins-Minerals (PRESERVISION/LUTEIN PO) Take by mouth daily.      . propranolol (INDERAL) 10 MG tablet Take 1 tablet  (10 mg total) by mouth as needed (may take one tablet 3-4 times in a day, call physician with symptoms.).  30 tablet  5  . Rivaroxaban (XARELTO) 15 MG TABS tablet Take 1 tablet (15 mg total) by mouth daily.  30 tablet  6   No current facility-administered medications for this visit.    Allergies  Allergen Reactions  . Ceclor [Cefaclor]   . Codeine   . Contrast Media [Iodinated Diagnostic Agents]   . Doxycycline   . Fenoprofen Calcium   . Penicillins   . Shellfish Allergy   . Sulfa Drugs Cross Reactors   . Warfarin Sodium     History   Social History  . Marital Status: Married    Spouse Name: N/A    Number of Children: 3  . Years of Education: N/A   Occupational History  .     Social History Main Topics  . Smoking status: Never Smoker   . Smokeless tobacco: Not on file  . Alcohol Use: No  . Drug Use: No  . Sexual Activity: Yes    Birth Control/ Protection: Post-menopausal   Other Topics Concern  . Not on file   Social History Narrative   Lives in [place].  Retired Architect.  Family History  Problem Relation Age of Onset  . Hypertension Father   . Heart attack Father   . Parkinsonism Father   . Diabetes Brother   . Heart disease Brother   . Lung cancer Brother   . Stroke Mother    Physical Exam: Filed Vitals:   11/24/12 0925  BP: 128/76  Pulse: 64  Height: 5\' 4"  (1.626 m)  Weight: 181 lb 12.8 oz (82.464 kg)    GEN- The patient is well appearing, alert and oriented x 3 today.   Head- normocephalic, atraumatic Eyes-  Prosthetic R eye Ears- hearing intact Oropharynx- clear Neck- supple, no JVP Lymph- no cervical lymphadenopathy Lungs- Clear to ausculation bilaterally, normal work of breathing Heart- Regular rate and rhythm, no murmurs, rubs or gallops, PMI not laterally displaced GI- soft, NT, ND, + BS Extremities- no clubbing, cyanosis, or edema Neuro- strength and sensation are intact  ekg today reveals sinus rhythm 66 bpm, PR  212, RsR'  Assessment and Plan:  1. Paroxysmal atrial fibrillation Stable No change required today She will consider AAD therapy if her afib burden increases  2. HTN Stable No change required today  Return to see me in 1 year

## 2012-12-10 ENCOUNTER — Ambulatory Visit
Admission: RE | Admit: 2012-12-10 | Discharge: 2012-12-10 | Disposition: A | Discharge status: Home / Self Care | Payer: Medicare Other | Source: Ambulatory Visit

## 2012-12-10 ENCOUNTER — Other Ambulatory Visit: Payer: Self-pay | Admitting: Internal Medicine

## 2012-12-10 DIAGNOSIS — Z1231 Encounter for screening mammogram for malignant neoplasm of breast: Secondary | ICD-10-CM

## 2013-02-08 ENCOUNTER — Other Ambulatory Visit: Payer: Self-pay | Admitting: Internal Medicine

## 2013-04-05 ENCOUNTER — Other Ambulatory Visit: Payer: Self-pay | Admitting: Internal Medicine

## 2013-04-21 ENCOUNTER — Ambulatory Visit (INDEPENDENT_AMBULATORY_CARE_PROVIDER_SITE_OTHER): Payer: Medicare Other | Admitting: Gynecology

## 2013-04-21 ENCOUNTER — Encounter: Payer: Self-pay | Admitting: Gynecology

## 2013-04-21 DIAGNOSIS — N952 Postmenopausal atrophic vaginitis: Secondary | ICD-10-CM

## 2013-04-21 DIAGNOSIS — L900 Lichen sclerosus et atrophicus: Secondary | ICD-10-CM

## 2013-04-21 DIAGNOSIS — L940 Localized scleroderma [morphea]: Secondary | ICD-10-CM

## 2013-04-21 MED ORDER — CLOBETASOL PROPIONATE 0.05 % EX CREA: TOPICAL_CREAM | CUTANEOUS | Status: DC

## 2013-04-21 NOTE — Progress Notes (Signed)
Megan Hester 1930/03/20 [id number]        80 y.o.  [id number] presents complaining of dyspareunia and vaginal dryness with pain. Seems to be getting worse over the last several months. No discharge odor or significant itching. Does have a history of inflammatory vulvitis suspicious for lichen sclerosis. Was given a prescription for Temovate cream but has been using it very infrequently.  Past medical history,surgical history, problem list, medications, allergies, family history and social history were all reviewed and documented in the EPIC chart.  Exam: Kim assistant General appearance  Normal Pelvic: External BUS vagina with atrophic changes. No evidence of discharge or inflammation. White blanched skin changes from clitoral region to the perineal body consistent with lichen sclerosus. No specific lesions. First to second-degree rectocele noted. Cervix normal. Uterus normal sized mobile nontender. Adnexa without masses or tenderness.  Assessment/Plan:  80 y.o. [name] with vaginal dryness, irritation and dyspareunia. Prior vulvar biopsy consistent with an inflammatory vulvitis. Had been using Temovate but I do not think consistently. Reviewed with her how hard to differentiate how much of her symptoms are being contributed by atrophic changes versus inflammatory changes. I again reviewed as in the past issues of estrogen use in a breast cancer survivor on whether we should add topical estrogen such as Vagifem. The disclaimers for its absorption and stimulating breast cancer growth discussed. At this point my recommendation would be to more aggressively treat with Temovate nightly x1 month then every other night for 2 weeks then every fourth night for 2 weeks. Followup in 4-6 weeks for reexamination. Patient agrees with this and will followup in 4-6 weeks refill for her Temovate 60 g tube with one refill provided.   Note: This document was prepared with digital dictation and possible smart phrase  technology. Any transcriptional errors that result from this process are unintentional.   Anastasio Auerbach MD, 11:41 AM 04/21/2013

## 2013-04-21 NOTE — Patient Instructions (Signed)
Use the Temovate cream nightly for one month then every other night for 2 weeks then every fourth night for 2 weeks. Followup with me in 4-6 weeks for reexamination.

## 2013-05-25 ENCOUNTER — Ambulatory Visit (INDEPENDENT_AMBULATORY_CARE_PROVIDER_SITE_OTHER): Payer: Medicare Other | Admitting: Gynecology

## 2013-05-25 ENCOUNTER — Encounter: Payer: Self-pay | Admitting: Gynecology

## 2013-05-25 DIAGNOSIS — N762 Acute vulvitis: Secondary | ICD-10-CM

## 2013-05-25 DIAGNOSIS — N816 Rectocele: Secondary | ICD-10-CM

## 2013-05-25 DIAGNOSIS — N760 Acute vaginitis: Secondary | ICD-10-CM

## 2013-05-25 DIAGNOSIS — N952 Postmenopausal atrophic vaginitis: Secondary | ICD-10-CM

## 2013-05-25 NOTE — Progress Notes (Signed)
Fahima Cifelli Wert April 16, 1930 [id number]        80 y.o.  G3P3003 presents in followup of her vulvitis.  Was initially seen with intense bulbar pruritus. Biopsy showed hyperkeratosis consistent with a lichens sclerosis or lichen simplex chronicus. Was treated with Temovate cream and has done well. She is just coming off a month of continuous nightly use. Notes that she is not having any itching or other symptoms.  Past medical history,surgical history, problem list, medications, allergies, family history and social history were all reviewed and documented in the EPIC chart.  Directed ROS to system complaints/issues with pertinent positives and negatives documented in the history of present illness/assessment and plan.  Exam: Kim assistant General appearance  Normal External BUS vagina with atrophic changes. Skin blanching from periclitoral to perianal region bilaterally. First to second-degree rectocele noted. Cervix atrophic. Uterus grossly normal size midline mobile nontender. Adnexa without gross masses or tenderness  Assessment/Plan:  80 y.o. G3P3003 chronic vulvitis with dyspareunia and chronic itching. Good response to Temovate cream. Patient will taper off over the next several weeks and then wait. If she has a recurrence of her symptoms she will reinitiate Temovate nightly for 2 weeks and then taper back off the next 2 weeks. If she does well with his intermittent treatment over the next year then we'll follow.   Note: This document was prepared with digital dictation and possible smart phrase technology. Any transcriptional errors that result from this process are unintentional.   Anastasio Auerbach MD, 2:21 PM 05/25/2013

## 2013-05-25 NOTE — Patient Instructions (Signed)
Followup as needed for irritation.

## 2013-07-06 ENCOUNTER — Other Ambulatory Visit: Payer: Self-pay | Admitting: Internal Medicine

## 2013-08-18 ENCOUNTER — Telehealth: Payer: Self-pay

## 2013-08-18 NOTE — Telephone Encounter (Signed)
Patient called to get samples of xarelto 15 mg placed samples up front

## 2013-11-08 ENCOUNTER — Other Ambulatory Visit: Payer: Self-pay

## 2013-11-08 DIAGNOSIS — Z1231 Encounter for screening mammogram for malignant neoplasm of breast: Secondary | ICD-10-CM

## 2013-11-09 ENCOUNTER — Other Ambulatory Visit: Payer: Self-pay | Admitting: Internal Medicine

## 2013-11-16 ENCOUNTER — Encounter: Payer: Self-pay | Admitting: Internal Medicine

## 2013-11-16 ENCOUNTER — Ambulatory Visit (INDEPENDENT_AMBULATORY_CARE_PROVIDER_SITE_OTHER): Payer: Medicare Other | Admitting: Internal Medicine

## 2013-11-16 VITALS — BP 126/72 | HR 65 | Ht 64.5 in | Wt 188.8 lb

## 2013-11-16 DIAGNOSIS — I10 Essential (primary) hypertension: Secondary | ICD-10-CM

## 2013-11-16 DIAGNOSIS — I480 Paroxysmal atrial fibrillation: Secondary | ICD-10-CM

## 2013-11-16 NOTE — Patient Instructions (Addendum)
Your physician wants you to follow-up in: 12 months with Dr. Allred. You will receive a reminder letter in the mail two months in advance. If you don't receive a letter, please call our office to schedule the follow-up appointment.  Your physician recommends that you continue on your current medications as directed. Please refer to the Current Medication list given to you today.  

## 2013-11-16 NOTE — Progress Notes (Signed)
PCP: Geoffery Lyons, MD  Megan Hester is a 80 y.o. female with a h/o paroxysmal atrial fibrillation who presents today for follow-up. Presently, she is doing well.    She denies CP.  Her SOB is improved.  She continues to have short episodes of afib every few weeks which resolve with propranolol.  She does not wish to consider AAD therapy at this time.   Today, she denies symptoms of orthopnea, PND, lower extremity edema, dizziness, presyncope, syncope, or neurologic sequela.  She is tolerating xarelto without significant bleeding.  Past Medical History  Diagnosis Date  . Macular degeneration   . Hypertension   . Paroxysmal atrial fibrillation   . 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 Arthritis   . Cancer 1999    Breast cancer--DCIS   Past Surgical History  Procedure Laterality Date  . Breast lumpectomy  1999    RADIATION  . Cardiac catheterization  02/05/1999    HYPERDYNAMIC LEFT VENTRICLE WITH EF OF 75-80%  . 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 Right eye removed      Current Outpatient Prescriptions  Medication Sig Dispense Refill  . CARTIA XT 240 MG 24 hr capsule take 1 capsule by mouth once daily  30 capsule  5  . Cholecalciferol (VITAMIN D) 1000 UNITS capsule Take 1,000 Units by mouth daily.        . clobetasol cream (TEMOVATE) 0.05 % Use nightly for one month  60 g  1  . fexofenadine (ALLEGRA) 180 MG tablet Take 180 mg by mouth daily.        . fluticasone (FLONASE) 50 MCG/ACT nasal spray Place 1 spray into the nose daily.       [name] Kitchen lisinopril-hydrochlorothiazide (PRINZIDE,ZESTORETIC) 20-25 MG per tablet 1/2 tablet  DAILY      . metoprolol tartrate (LOPRESSOR) 25 MG tablet take 1/2 tablet by mouth twice a day  30 tablet  0  . Multiple Vitamins-Minerals (CENTRUM SILVER PO) Take 1 capsule by mouth.       . Multiple Vitamins-Minerals (PRESERVISION/LUTEIN PO) Take 1 capsule by mouth daily.       . propranolol (INDERAL) 10 MG tablet take 1 tablet by mouth  if needed .MAY take 1 tablet by mouth three to four times a day . CALL PHYSICIAN WITH SYMPTOMS.  30 tablet  5  . Rivaroxaban (XARELTO) 15 MG TABS tablet Take 1 tablet (15 mg total) by mouth daily.  30 tablet  6   No current facility-administered medications for this visit.    Allergies  Allergen Reactions  . Shellfish Allergy Swelling  . Ceclor [Cefaclor]   . Codeine   . Contrast Media [Iodinated Diagnostic Agents]   . Doxycycline   . Fenoprofen Calcium   . Penicillins   . Sulfa Drugs Cross Reactors   . Warfarin Sodium     History   Social History  . Marital Status: Married    Spouse Name: N/A    Number of Children: 3  . Years of Education: N/A   Occupational History  .     Social History Main Topics  . Smoking status: Never Smoker   . Smokeless tobacco: Not on file  . Alcohol Use: No  . Drug Use: No  . Sexual Activity: Yes    Birth Control/ Protection: Post-menopausal   Other Topics Concern  . Not on file   Social History Narrative   Lives in [place].  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    Family History  Problem Relation Age of Onset  . Hypertension Father   . Heart attack Father   . Parkinsonism Father   . Diabetes Brother   . Heart disease Brother   . Lung cancer Brother   . Stroke Mother    ROS- recent URI symptoms of fevers, sinus pressure, sore throat, cough, and ear pain for which Dr Reynaldo Minium has placed her on an antibiotic for URI. All other systems are reviewed and negative except as per HPI above  Physical Exam: Filed Vitals:   11/16/13 1503  BP: 126/72  Pulse: 65  Height: 5' 4.5" (1.638 m)  Weight: 188 lb 12.8 oz (85.639 kg)    GEN- The patient is well appearing, alert and oriented x 3 today.   Head- normocephalic, atraumatic Eyes-  Prosthetic R eye Ears- hearing intact Oropharynx- clear Neck- supple, no JVP Lymph- no cervical lymphadenopathy Lungs- Clear to ausculation bilaterally, normal work of breathing Heart- Regular rate and  rhythm, no murmurs, rubs or gallops, PMI not laterally displaced GI- soft, NT, ND, + BS Extremities- no clubbing, cyanosis, or edema Neuro- strength and sensation are intact  ekg today reveals sinus rhythm 66 bpm, PR 202, RsR'  Assessment and Plan:  1. Paroxysmal atrial fibrillation Stable No change required today She will consider AAD therapy if her afib burden increases Continue xarelto.  Dr Reynaldo Minium to follow bmet/ cbc  2. HTN Stable No change required today  Return to see me in 1 year

## 2013-12-06 ENCOUNTER — Other Ambulatory Visit: Payer: Self-pay | Admitting: Internal Medicine

## 2013-12-12 ENCOUNTER — Encounter: Payer: Self-pay | Admitting: Internal Medicine

## 2013-12-12 ENCOUNTER — Ambulatory Visit
Admission: RE | Admit: 2013-12-12 | Discharge: 2013-12-12 | Disposition: A | Discharge status: Home / Self Care | Payer: Medicare Other | Source: Ambulatory Visit

## 2013-12-12 DIAGNOSIS — Z1231 Encounter for screening mammogram for malignant neoplasm of breast: Secondary | ICD-10-CM

## 2014-01-03 ENCOUNTER — Other Ambulatory Visit: Payer: Self-pay | Admitting: Internal Medicine

## 2014-05-02 ENCOUNTER — Other Ambulatory Visit: Payer: Self-pay | Admitting: Internal Medicine

## 2014-06-07 ENCOUNTER — Other Ambulatory Visit: Payer: Self-pay | Admitting: Gynecology

## 2014-07-11 ENCOUNTER — Other Ambulatory Visit: Payer: Self-pay | Admitting: Internal Medicine

## 2014-08-01 ENCOUNTER — Encounter: Payer: Medicare Other | Admitting: Gynecology

## 2014-08-07 ENCOUNTER — Other Ambulatory Visit: Payer: Self-pay

## 2014-08-23 ENCOUNTER — Encounter: Payer: Self-pay | Admitting: Gynecology

## 2014-08-23 ENCOUNTER — Ambulatory Visit (INDEPENDENT_AMBULATORY_CARE_PROVIDER_SITE_OTHER): Payer: Medicare Other | Admitting: Gynecology

## 2014-08-23 VITALS — BP 120/70 | Ht 65.0 in | Wt 188.0 lb

## 2014-08-23 DIAGNOSIS — K429 Umbilical hernia without obstruction or gangrene: Secondary | ICD-10-CM | POA: Diagnosis not present

## 2014-08-23 DIAGNOSIS — N941 Dyspareunia: Secondary | ICD-10-CM | POA: Diagnosis not present

## 2014-08-23 DIAGNOSIS — Z01419 Encounter for gynecological examination (general) (routine) without abnormal findings: Secondary | ICD-10-CM

## 2014-08-23 DIAGNOSIS — IMO0002 Reserved for concepts with insufficient information to code with codable children: Secondary | ICD-10-CM

## 2014-08-23 DIAGNOSIS — N952 Postmenopausal atrophic vaginitis: Secondary | ICD-10-CM

## 2014-08-23 DIAGNOSIS — N816 Rectocele: Secondary | ICD-10-CM | POA: Diagnosis not present

## 2014-08-23 NOTE — Patient Instructions (Signed)
You may obtain a copy of any labs that were done today by logging onto MyChart as outlined in the instructions provided with your AVS (after visit summary). The office will not call with normal lab results but certainly if there are any significant abnormalities then we will contact you.   Health Maintenance, Female A healthy lifestyle and preventative care can promote health and wellness.  Maintain regular health, dental, and eye exams.  Eat a healthy diet. Foods like vegetables, fruits, whole grains, low-fat dairy products, and lean protein foods contain the nutrients you need without too many calories. Decrease your intake of foods high in solid fats, added sugars, and salt. Get information about a proper diet from your caregiver, if necessary.  Regular physical exercise is one of the most important things you can do for your health. Most adults should get at least 150 minutes of moderate-intensity exercise (any activity that increases your heart rate and causes you to sweat) each week. In addition, most adults need muscle-strengthening exercises on 2 or more days a week.   Maintain a healthy weight. The body mass index (BMI) is a screening tool to identify possible weight problems. It provides an estimate of body fat based on height and weight. Your caregiver can help determine your BMI, and can help you achieve or maintain a healthy weight. For adults 20 years and older:  A BMI below 18.5 is considered underweight.  A BMI of 18.5 to 24.9 is normal.  A BMI of 25 to 29.9 is considered overweight.  A BMI of 30 and above is considered obese.  Maintain normal blood lipids and cholesterol by exercising and minimizing your intake of saturated fat. Eat a balanced diet with plenty of fruits and vegetables. Blood tests for lipids and cholesterol should begin at age 61 and be repeated every 5 years. If your lipid or cholesterol levels are high, you are over 50, or you are a high risk for heart  disease, you may need your cholesterol levels checked more frequently.Ongoing high lipid and cholesterol levels should be treated with medicines if diet and exercise are not effective.  If you smoke, find out from your caregiver how to quit. If you do not use tobacco, do not start.  Lung cancer screening is recommended for adults aged 33 80 years who are at high risk for developing lung cancer because of a history of smoking. Yearly low-dose computed tomography (CT) is recommended for people who have at least a 30-pack-year history of smoking and are a current smoker or have quit within the past 15 years. A pack year of smoking is smoking an average of 1 pack of cigarettes a day for 1 year (for example: 1 pack a day for 30 years or 2 packs a day for 15 years). Yearly screening should continue until the smoker has stopped smoking for at least 15 years. Yearly screening should also be stopped for people who develop a health problem that would prevent them from having lung cancer treatment.  If you are pregnant, do not drink alcohol. If you are breastfeeding, be very cautious about drinking alcohol. If you are not pregnant and choose to drink alcohol, do not exceed 1 drink per day. One drink is considered to be 12 ounces (355 mL) of beer, 5 ounces (148 mL) of wine, or 1.5 ounces (44 mL) of liquor.  Avoid use of street drugs. Do not share needles with anyone. Ask for help if you need support or instructions about stopping  the use of drugs.  High blood pressure causes heart disease and increases the risk of stroke. Blood pressure should be checked at least every 1 to 2 years. Ongoing high blood pressure should be treated with medicines, if weight loss and exercise are not effective.  If you are 59 to 79 years old, ask your caregiver if you should take aspirin to prevent strokes.  Diabetes screening involves taking a blood sample to check your fasting blood sugar level. This should be done once every 3  years, after age 91, if you are within normal weight and without risk factors for diabetes. Testing should be considered at a younger age or be carried out more frequently if you are overweight and have at least 1 risk factor for diabetes.  Breast cancer screening is essential preventative care for women. You should practice "breast self-awareness." This means understanding the normal appearance and feel of your breasts and may include breast self-examination. Any changes detected, no matter how small, should be reported to a caregiver. Women in their 66s and 30s should have a clinical breast exam (CBE) by a caregiver as part of a regular health exam every 1 to 3 years. After age 101, women should have a CBE every year. Starting at age 100, women should consider having a mammogram (breast X-ray) every year. Women who have a family history of breast cancer should talk to their caregiver about genetic screening. Women at a high risk of breast cancer should talk to their caregiver about having an MRI and a mammogram every year.  Breast cancer gene (BRCA)-related cancer risk assessment is recommended for women who have family members with BRCA-related cancers. BRCA-related cancers include breast, ovarian, tubal, and peritoneal cancers. Having family members with these cancers may be associated with an increased risk for harmful changes (mutations) in the breast cancer genes BRCA1 and BRCA2. Results of the assessment will determine the need for genetic counseling and BRCA1 and BRCA2 testing.  The Pap test is a screening test for cervical cancer. Women should have a Pap test starting at age 57. Between ages 25 and 35, Pap tests should be repeated every 2 years. Beginning at age 37, you should have a Pap test every 3 years as long as the past 3 Pap tests have been normal. If you had a hysterectomy for a problem that was not cancer or a condition that could lead to cancer, then you no longer need Pap tests. If you are  between ages 50 and 76, and you have had normal Pap tests going back 10 years, you no longer need Pap tests. If you have had past treatment for cervical cancer or a condition that could lead to cancer, you need Pap tests and screening for cancer for at least 20 years after your treatment. If Pap tests have been discontinued, risk factors (such as a new sexual partner) need to be reassessed to determine if screening should be resumed. Some women have medical problems that increase the chance of getting cervical cancer. In these cases, your caregiver may recommend more frequent screening and Pap tests.  The human papillomavirus (HPV) test is an additional test that may be used for cervical cancer screening. The HPV test looks for the virus that can cause the cell changes on the cervix. The cells collected during the Pap test can be tested for HPV. The HPV test could be used to screen women aged 44 years and older, and should be used in women of any age  who have unclear Pap test results. After the age of 44, women should have HPV testing at the same frequency as a Pap test.  Colorectal cancer can be detected and often prevented. Most routine colorectal cancer screening begins at the age of 73 and continues through age 40. However, your caregiver may recommend screening at an earlier age if you have risk factors for colon cancer. On a yearly basis, your caregiver may provide home test kits to check for hidden blood in the stool. Use of a small camera at the end of a tube, to directly examine the colon (sigmoidoscopy or colonoscopy), can detect the earliest forms of colorectal cancer. Talk to your caregiver about this at age 14, when routine screening begins. Direct examination of the colon should be repeated every 5 to 10 years through age 13, unless early forms of pre-cancerous polyps or small growths are found.  Hepatitis C blood testing is recommended for all people born from [birth year] through 1963 and any  individual with known risks for hepatitis C.  Practice safe sex. Use condoms and avoid high-risk sexual practices to reduce the spread of sexually transmitted infections (STIs). Sexually active women aged 67 and younger should be checked for Chlamydia, which is a common sexually transmitted infection. Older women with new or multiple partners should also be tested for Chlamydia. Testing for other STIs is recommended if you are sexually active and at increased risk.  Osteoporosis is a disease in which the bones lose minerals and strength with aging. This can result in serious bone fractures. The risk of osteoporosis can be identified using a bone density scan. Women ages 34 and over and women at risk for fractures or osteoporosis should discuss screening with their caregivers. Ask your caregiver whether you should be taking a calcium supplement or vitamin D to reduce the rate of osteoporosis.  Menopause can be associated with physical symptoms and risks. Hormone replacement therapy is available to decrease symptoms and risks. You should talk to your caregiver about whether hormone replacement therapy is right for you.  Use sunscreen. Apply sunscreen liberally and repeatedly throughout the day. You should seek shade when your shadow is shorter than you. Protect yourself by wearing long sleeves, pants, a wide-brimmed hat, and sunglasses year round, whenever you are outdoors.  Notify your caregiver of new moles or changes in moles, especially if there is a change in shape or color. Also notify your caregiver if a mole is larger than the size of a pencil eraser.  Stay current with your immunizations. Document Released: 08/12/2010 Document Revised: 05/24/2012 Document Reviewed: 08/12/2010 [facility] Patient Information 2014 [employer].

## 2014-08-23 NOTE — Progress Notes (Signed)
Megan Hester 1930/09/24 [id number]        81 y.o.  G3P3003 for breast and pelvic exam. Several issues noted below.  Past medical history,surgical history, problem list, medications, allergies, family history and social history were all reviewed and documented as reviewed in the EPIC chart.  ROS:  Performed with pertinent positives and negatives included in the history, assessment and plan.   Additional significant findings :  none   Exam: Kim Counsellor Vitals:   08/23/14 0756  BP: 120/70  Height: 5\' 5"  (1.651 m)  Weight: 188 lb (85.276 kg)   General appearance:  Normal affect, orientation and appearance. Skin: Grossly normal HEENT: Without gross lesions.  No cervical or supraclavicular adenopathy. Thyroid normal.  Lungs:  Clear without wheezing, rales or rhonchi Cardiac: RR, without RMG Abdominal:  Soft, nontender, without masses, guarding, rebound, organomegaly. Small easily reducible umbilical hernia Breasts:  Examined lying and sitting. Left without masses, retractions, discharge or axillary adenopathy.  Right with old lumpectomy scar and radiation changes. No masses discharge or adenopathy. Pelvic:  Ext/BUS/vagina with atrophic changes. First to second-degree rectocele.  Cervix with atrophic changes  Uterus difficult to palpate the course of normal in size, nontenderr   Adnexa  Without gross masses or tenderness    Anus and perineum  Normal excepting old external hemorrhoids  Rectovaginal  Normal sphincter tone without palpated masses or tenderness. First to second degree rectocele   Assessment/Plan:  81 y.o. [name] female for breast and pelvic exam.   1. Postmenopausal/atrophic genital changes. Using OTC vaginal lubricant but still having some discomfort with intercourse. Recommended trial of oils such as vegetable oil. We discussed possible estrogen vaginally but the issues of absorption with her history of breast cancer also reviewed. Patient will follow up if  this continues to be an issue. 2. History of chronic vulvitis. Well controlled with Temovate 0.5% cream used intermittently. Just picked up a refill. Will call if she needs more. 3. Small umbilical hernia easily reducible. Present for years. Continue to monitor the report any issues. 4. Rectocele. Stable for years and asymptomatic. Continue to monitor. 5. Mammography 12/2013. Continue with annual mammography. History of right lumpectomy and radiation. Exam NED. SBE monthly reviewed. 6. Pap smear 2004. No Pap smear done today.  No history of abnormal Pap smears.  Per current screening guidelines we both agree to stop screening as she is over the age of 28. 15. DEXA 2012. Her and Dr. Reynaldo Minium have agreed not to do any further Dexa's as patient would refuse to take medication regardless. She'll continue to follow up with him in reference to this. 8. Colonoscopy 2011. Repeat at their recommended interval. 9. Health maintenance. No routine blood work done as this is all done at Dr. Jacquiline Doe office. Follow up in one year, sooner as needed.   Anastasio Auerbach MD, 8:24 AM 08/23/2014

## 2014-08-24 LAB — URINALYSIS W MICROSCOPIC + REFLEX CULTURE
Bilirubin Urine: NEGATIVE
Casts: NONE SEEN
Crystals: NONE SEEN
GLUCOSE, UA: NEGATIVE mg/dL
HGB URINE DIPSTICK: NEGATIVE
Ketones, ur: NEGATIVE mg/dL
Nitrite: NEGATIVE
Protein, ur: NEGATIVE mg/dL
Specific Gravity, Urine: 1.016 (ref 1.005–1.030)
Urobilinogen, UA: 0.2 mg/dL (ref 0.0–1.0)
pH: 5.5 (ref 5.0–8.0)

## 2014-08-25 LAB — URINE CULTURE: Colony Count: >=100000

## 2014-11-07 ENCOUNTER — Other Ambulatory Visit: Payer: Self-pay | Admitting: Internal Medicine

## 2014-11-29 ENCOUNTER — Other Ambulatory Visit: Payer: Self-pay | Admitting: Internal Medicine

## 2014-12-05 ENCOUNTER — Other Ambulatory Visit: Payer: Self-pay | Admitting: Internal Medicine

## 2014-12-06 ENCOUNTER — Other Ambulatory Visit: Payer: Self-pay

## 2014-12-06 DIAGNOSIS — Z1231 Encounter for screening mammogram for malignant neoplasm of breast: Secondary | ICD-10-CM

## 2014-12-18 ENCOUNTER — Encounter: Payer: Self-pay | Admitting: Internal Medicine

## 2014-12-18 ENCOUNTER — Ambulatory Visit (INDEPENDENT_AMBULATORY_CARE_PROVIDER_SITE_OTHER): Payer: Medicare Other | Admitting: Internal Medicine

## 2014-12-18 VITALS — BP 130/88 | HR 130 | Ht 65.0 in | Wt 188.0 lb

## 2014-12-18 DIAGNOSIS — I10 Essential (primary) hypertension: Secondary | ICD-10-CM

## 2014-12-18 DIAGNOSIS — I480 Paroxysmal atrial fibrillation: Secondary | ICD-10-CM | POA: Diagnosis not present

## 2014-12-18 MED ORDER — DILTIAZEM HCL ER COATED BEADS 240 MG PO CP24: 240.0000 mg | ORAL_CAPSULE | Freq: Two times a day (BID) | ORAL | Status: DC

## 2014-12-18 NOTE — Progress Notes (Signed)
PCP: Geoffery Lyons, MD  Megan Hester is a 81 y.o. female with a h/o atrial fibrillation who presents today for follow-up.  She is in afib with RVR today but unaware.  She denies palpitations and is not sure how long that she has been in AF.  Her husband has advanced dementia and this has occupied her mind.  She finds that at night, he is very restless and she has to try to help calm his agitation.  She notices frequent chest discomfort with this.  She also notices SOB with moderate activity for several months.   Today, she denies symptoms of orthopnea, PND, lower extremity edema, dizziness, presyncope, syncope, or neurologic sequela.  She is tolerating xarelto without significant bleeding.  Past Medical History  Diagnosis Date  . Macular degeneration   . Hypertension   . Paroxysmal atrial fibrillation (HCC)   . 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 Arthritis   . Cancer [facility]) 1999    Breast cancer--DCIS   Past Surgical History  Procedure Laterality Date  . Breast lumpectomy  1999    RADIATION  . Cardiac catheterization  02/05/1999    HYPERDYNAMIC LEFT VENTRICLE WITH EF OF 75-80%  . 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 Right eye removed      Current Outpatient Prescriptions  Medication Sig Dispense Refill  . Cholecalciferol (VITAMIN D) 1000 UNITS capsule Take 1,000 Units by mouth daily.      . clobetasol cream (TEMOVATE) 0.05 % USE NIGHTLY FOR 1 MONTH 60 g 0  . diltiazem (CARDIZEM CD) 240 MG 24 hr capsule Take 1 capsule (240 mg total) by mouth 2 (two) times daily. 180 capsule 3  . fluticasone (FLONASE) 50 MCG/ACT nasal spray Place 1 spray into the nose daily.     [name] Kitchen lisinopril-hydrochlorothiazide (PRINZIDE,ZESTORETIC) 20-12.5 MG tablet Take 0.5 tablets by mouth daily.    . Loratadine (CLARITIN PO) Take 1 tablet by mouth daily as needed (allergies).     . metoprolol tartrate (LOPRESSOR) 25 MG tablet take 1/2 tablet by mouth twice a day 30 tablet 5  . Multiple  Vitamins-Minerals (CENTRUM SILVER PO) Take 1 capsule by mouth daily.     . Multiple Vitamins-Minerals (PRESERVISION/LUTEIN PO) Take 1 capsule by mouth daily.     . propranolol (INDERAL) 10 MG tablet take 1 tablet by mouth if needed .MAY take 1 tablet by mouth three to four times a day . CALL PHYSICIAN WITH SYMPTOMS. 30 tablet 5  . Rivaroxaban (XARELTO) 15 MG TABS tablet Take 1 tablet (15 mg total) by mouth daily. 30 tablet 6   No current facility-administered medications for this visit.    Allergies  Allergen Reactions  . Shellfish Allergy Hives and Swelling  . Atropine     Causes AFIB  . Ceclor [Cefaclor] Hives  . Codeine     Leg pain  . Contrast Media [Iodinated Diagnostic Agents] Hives  . Doxycycline     unknown  . Fenoprofen Calcium Swelling  . Isopto Hyoscine [Scopolamine]     Causes AFIB  . Levaquin [Levofloxacin]     Rash & causes AFIB  . Other     Naphon - swelling  . Penicillins     unknown  . Sulfa Drugs Cross Reactors     unknown  . Warfarin Sodium     Bleeding    Social History   Social History  . Marital Status: Married    Spouse Name:  N/A  . Number of Children: 3  . Years of Education: N/A   Occupational History  .     Social History Main Topics  . Smoking status: Never Smoker   . Smokeless tobacco: Not on file  . Alcohol Use: No  . Drug Use: No  . Sexual Activity: Yes    Birth Control/ Protection: Post-menopausal     Comment: 1st intercourse 59 yo-1 partner   Other Topics Concern  . 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    Family History  Problem Relation Age of Onset  . Hypertension Father   . Heart attack Father   . Parkinsonism Father   . Diabetes Brother   . Heart disease Brother   . Lung cancer Brother   . Stroke Mother    ROS- recent URI symptoms of fevers, sinus pressure, sore throat, cough, and ear pain for which Dr Reynaldo Minium has placed her on an antibiotic for URI. All  other systems are reviewed and negative except as per HPI above  Physical Exam: Filed Vitals:   12/18/14 1622  BP: 130/88  Pulse: 130  Height: 5\' 5"  (1.651 m)  Weight: 188 lb (85.276 kg)    GEN- The patient is well appearing, alert and oriented x 3 today.   Head- normocephalic, atraumatic Eyes-  Prosthetic R eye Ears- hearing intact Oropharynx- clear Neck- supple, no JVP Lungs- Clear to ausculation bilaterally, normal work of breathing Heart- tachycardic irregular rhythm GI- soft, NT, ND, + BS Extremities- no clubbing, cyanosis, or edema Neuro- strength and sensation are intact  ekg today reveals afib, V rate 130 bpm, nonspecific ST/T changes  Assessment and Plan:  1. Paroxysmal atrial fibrillation Likely has become persistent She is minimally aware of RVR today She was previously on lower dose xarelto due to concerns of occular bleeding She will need bmet when she returns to AF clinic for possible dose adjustment I have increased diltiazem CD from 240mg  daily to 240mg  BID today She will return to the AF clinic later this week for better rate control.  She may require increase in metoprolol at that time.  She may require weekly visits until her V rates are improved Once V rates are stable, I will like for her to have an echo and myoview. We could consider AAD therapy though she wanted to avoid this previously.  2. HTN Stable No change required today  3. CP and SOB Likely due to AF with RVR Will reassess once V rates are improved Echo and myoview once V rates are more stable  This is a complex patient with a high level of decision making required.  Given uncontrolled AF, she is at risk for decompensation/ hospitalization.  Return to see Roderic Palau NP in the AF clinic later this week for further assessment of rate control with increased diltiazem She will see weekly if required and I will see again in 4 weeks  [name] MD, [facility] 12/18/2014 5:12 PM

## 2014-12-18 NOTE — Patient Instructions (Addendum)
Medication Instructions:  Your physician has recommended you make the following change in your medication:  1) Increase Diltiazem to 240mg  twice daily   Labwork: None ordered   Testing/Procedures: None ordered   Follow-Up: Your physician recommends that you schedule a follow-up appointment on Thurs with Roderic Palau, NP and 4 weeks with Dr Rayann Heman   Any Other Special Instructions Will Be Listed Below (If Applicable).     If you need a refill on your cardiac medications before your next appointment, please call your pharmacy.

## 2014-12-21 ENCOUNTER — Ambulatory Visit (HOSPITAL_COMMUNITY)
Admission: RE | Admit: 2014-12-21 | Discharge: 2014-12-21 | Disposition: A | Discharge status: Home / Self Care | Payer: Medicare Other | Source: Ambulatory Visit | Attending: Nurse Practitioner | Admitting: Nurse Practitioner

## 2014-12-21 VITALS — BP 112/70 | HR 67 | Ht 64.0 in | Wt 190.0 lb

## 2014-12-21 DIAGNOSIS — R079 Chest pain, unspecified: Secondary | ICD-10-CM | POA: Diagnosis not present

## 2014-12-21 DIAGNOSIS — I480 Paroxysmal atrial fibrillation: Secondary | ICD-10-CM | POA: Diagnosis not present

## 2014-12-21 DIAGNOSIS — I4891 Unspecified atrial fibrillation: Secondary | ICD-10-CM | POA: Insufficient documentation

## 2014-12-21 DIAGNOSIS — R0600 Dyspnea, unspecified: Secondary | ICD-10-CM | POA: Diagnosis not present

## 2014-12-21 LAB — BASIC METABOLIC PANEL
Anion gap: 7 (ref 5–15)
BUN: 20 mg/dL (ref 6–20)
CALCIUM: 9.5 mg/dL (ref 8.9–10.3)
CO2: 25 mmol/L (ref 22–32)
CREATININE: 1.01 mg/dL — AB (ref 0.44–1.00)
Chloride: 106 mmol/L (ref 101–111)
GFR calc Af Amer: 58 mL/min — ABNORMAL LOW (ref >60)
GFR, EST NON AFRICAN AMERICAN: 50 mL/min — AB (ref >60)
Glucose, Bld: 115 mg/dL — ABNORMAL HIGH (ref 65–99)
POTASSIUM: 4.1 mmol/L (ref 3.5–5.1)
SODIUM: 138 mmol/L (ref 135–145)

## 2014-12-21 NOTE — Patient Instructions (Addendum)
Your physician has requested that you have an echocardiogram. Echocardiography is a painless test that uses sound waves to create images of your heart. It provides your doctor with information about the size and shape of your heart and how well your heart's chambers and valves are working. This procedure takes approximately one hour. There are no restrictions for this procedure.   Your physician has requested that you have a lexiscan myoview. For further information please visit [email or web address]. Please follow instruction sheet, as given.   

## 2014-12-22 ENCOUNTER — Encounter (HOSPITAL_COMMUNITY): Payer: Self-pay | Admitting: Nurse Practitioner

## 2014-12-22 NOTE — Progress Notes (Signed)
Patient ID: Megan Hester, female   DOB: [birth year], 81 y.o.   MRN: [id number]

## 2014-12-22 NOTE — Progress Notes (Addendum)
Patient ID: Megan Hester, female   DOB: March 16, 1930, 81 y.o.   MRN: [id number]     Primary Care Physician: Geoffery Lyons, MD Referring Physician: Dr. Gean Birchwood is a 81 y.o. female with a h/o paf that saw Dr. Rayann Heman 11/7 and was found to have afib with rvr that was unaware of her afib. 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 He does roam at night and she is chronically sleep deprived. She was c/o dyspnea and some chest discomfort and he wanted to perform additional tests when her heart rate stabilized. Cardizem was doubled ant that visit and today, she remains in afib with v rates in the 60's. No c/o chest pain today but often times will notice when trying to calm her agitated husband, continues to notice some shortness of breath, otherwise feels ok..  Today, she denies symptoms of palpitations, chest pain,  orthopnea, PND, lower extremity edema, dizziness, presyncope, syncope, or neurologic sequela. Mild dyspnea. The patient is tolerating medications without difficulties and is otherwise without complaint today.   Past Medical History  Diagnosis Date  . Macular degeneration   . Hypertension   . Paroxysmal atrial fibrillation (HCC)   . 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 Arthritis   . Cancer [facility]) 1999    Breast cancer--DCIS   Past Surgical History  Procedure Laterality Date  . Breast lumpectomy  1999    RADIATION  . Cardiac catheterization  02/05/1999    HYPERDYNAMIC LEFT VENTRICLE WITH EF OF 75-80%  . 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 Right eye removed      Current Outpatient Prescriptions  Medication Sig Dispense Refill  . Cholecalciferol (VITAMIN D) 1000 UNITS capsule Take 1,000 Units by mouth daily.      . clobetasol cream (TEMOVATE) 0.05 % USE NIGHTLY FOR 1 MONTH 60 g 0  . diltiazem (CARDIZEM CD) 240 MG 24 hr capsule Take 1 capsule (240 mg total) by mouth 2  (two) times daily. 180 capsule 3  . fluticasone (FLONASE) 50 MCG/ACT nasal spray Place 1 spray into the nose daily.     [name] Kitchen lisinopril-hydrochlorothiazide (PRINZIDE,ZESTORETIC) 20-12.5 MG tablet Take 0.5 tablets by mouth daily.    . Loratadine (CLARITIN PO) Take 1 tablet by mouth daily as needed (allergies).     . metoprolol tartrate (LOPRESSOR) 25 MG tablet take 1/2 tablet by mouth twice a day 30 tablet 5  . Multiple Vitamins-Minerals (CENTRUM SILVER PO) Take 1 capsule by mouth daily.     . Multiple Vitamins-Minerals (PRESERVISION/LUTEIN PO) Take 1 capsule by mouth daily.     . propranolol (INDERAL) 10 MG tablet take 1 tablet by mouth if needed .MAY take 1 tablet by mouth three to four times a day . CALL PHYSICIAN WITH SYMPTOMS. 30 tablet 5  . Rivaroxaban (XARELTO) 15 MG TABS tablet Take 1 tablet (15 mg total) by mouth daily. 30 tablet 6   No current facility-administered medications for this encounter.    Allergies  Allergen Reactions  . Shellfish Allergy Hives and Swelling  . Atropine     Causes AFIB  . Ceclor [Cefaclor] Hives  . Codeine     Leg pain  . Contrast Media [Iodinated Diagnostic Agents] Hives  . Doxycycline     unknown  . Fenoprofen Calcium Swelling  . Isopto Hyoscine [Scopolamine]     Causes  AFIB  . Levaquin [Levofloxacin]     Rash & causes AFIB  . Other     Naphon - swelling  . Penicillins     unknown  . Sulfa Drugs Cross Reactors     unknown  . Warfarin Sodium     Bleeding    Social History   Social History  . Marital Status: Married    Spouse Name: N/A  . Number of Children: 3  . Years of Education: N/A   Occupational History  .     Social History Main Topics  . Smoking status: Never Smoker   . Smokeless tobacco: Not on file  . Alcohol Use: No  . Drug Use: No  . Sexual Activity: Yes    Birth Control/ Protection: Post-menopausal     Comment: 1st intercourse 89 yo-1 partner   Other Topics Concern  . Not on file   Social History Narrative    Lives in [place].  Retired Solicitor.    Family History  Problem Relation Age of Onset  . Hypertension Father   . Heart attack Father   . Parkinsonism Father   . Diabetes Brother   . Heart disease Brother   . Lung cancer Brother   . Stroke Mother     ROS- All systems are reviewed and negative except as per the HPI above  Physical Exam: Filed Vitals:   12/21/14 1425  BP: 112/70  Pulse: 67  Height: 5\' 4"  (1.626 m)  Weight: 190 lb (86.183 kg)    GEN- The patient is well appearing, alert and oriented x 3 today.   Head- normocephalic, atraumatic Eyes-  Sclera clear, conjunctiva pink Ears- hearing intact Oropharynx- clear Neck- supple, no JVP Lymph- no cervical lymphadenopathy Lungs- Clear to ausculation bilaterally, normal work of breathing Heart- Regular rate and rhythm, no murmurs, rubs or gallops, PMI not laterally displaced GI- soft, NT, ND, + BS Extremities- no clubbing, cyanosis, or edema MS- no significant deformity or atrophy Skin- no rash or lesion Psych- euthymic mood, full affect Neuro- strength and sensation are intact  EKG- afib at 67 bpm, qrs int 94 ms, qtc435 ms Epic records reviewed  Assessment and Plan: 1. Afib V rates now controlled with doubling of cardizem, continue 240 mg bid as well as BB Continue xarelto 15 mg qd, pt previously on this dose for fear of ocular bleeding, but will obtain a bmet for possible dose adjustment.  2. C/o chest pain and c/o dyspnea Likely due to recent rvr   But to err on cautious side,will schedule echo and lexi myoview, pending 11/28.  F/u in 1-2 weeks for further assessment of v rates and symptoms Will f/u with Dr. Rayann Heman 12/14  Geroge Baseman. Mila [facility] [address] [place], [place] [address] [phone]   Addendum: Pt by bmet with creatinine calculated at 58.4 and qualifies for xarelto 20 mg a day. 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 She will discuss with him further 12/14 on f/u.

## 2014-12-22 NOTE — Addendum Note (Signed)
Encounter addended by: Sherran Needs, NP on: 12/22/2014  4:36 PM<BR>     Documentation filed: Notes Section

## 2014-12-28 ENCOUNTER — Encounter (HOSPITAL_COMMUNITY): Payer: Self-pay | Admitting: Nurse Practitioner

## 2014-12-28 ENCOUNTER — Ambulatory Visit (HOSPITAL_COMMUNITY)
Admission: RE | Admit: 2014-12-28 | Discharge: 2014-12-28 | Disposition: A | Discharge status: Home / Self Care | Payer: Medicare Other | Source: Ambulatory Visit | Attending: Nurse Practitioner | Admitting: Nurse Practitioner

## 2014-12-28 VITALS — BP 106/62 | HR 82 | Ht 64.0 in | Wt 191.0 lb

## 2014-12-28 DIAGNOSIS — I4891 Unspecified atrial fibrillation: Secondary | ICD-10-CM | POA: Insufficient documentation

## 2014-12-28 DIAGNOSIS — R0600 Dyspnea, unspecified: Secondary | ICD-10-CM | POA: Insufficient documentation

## 2014-12-28 DIAGNOSIS — I481 Persistent atrial fibrillation: Secondary | ICD-10-CM

## 2014-12-28 DIAGNOSIS — R079 Chest pain, unspecified: Secondary | ICD-10-CM | POA: Diagnosis not present

## 2014-12-28 DIAGNOSIS — I4819 Other persistent atrial fibrillation: Secondary | ICD-10-CM

## 2014-12-28 MED ORDER — RIVAROXABAN 20 MG PO TABS: 20.0000 mg | ORAL_TABLET | Freq: Every day | ORAL | Status: DC

## 2014-12-28 NOTE — H&P (Signed)
Patient ID: Megan Hester, female   DOB: 09-Aug-1930, 81 y.o.   MRN: [id number]     Primary Care Physician: Geoffery Lyons, MD Referring Physician: Dr. Gean Birchwood is a 81 y.o. female with a h/o paf that saw Dr. Rayann Heman 11/7 and was found to have afib with rvr that was unaware of irregular heart beat but had noticed some chest pain, dyspnea. She does have h/o of frequent intermittent afib but states it has never lasted this long. She continues to have shortness of breath and fatigue, but symptoms are improved with rate control. 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 He does roam at night and she is chronically sleep deprived. She was c/o dyspnea and some chest discomfort when seen  by Dr. Rayann Heman, he wanted to perform additional tests when her heart rate stabilized.  No c/o chest pain today but often times will still notice when trying to calm her agitated husband, continues to notice some shortness of breath,  Improved, otherwise feels ok.Pt has been on xarelto 15 mg due to her request due to past bleeding in her eye with subsequent prosthetic eye. However, she realizes that she is not fully protected from stroke and is willing to take the correct dose, which Dr. Rayann Heman has encouraged her to do in the past.  Today, she denies symptoms of palpitations, chest pain,  orthopnea, PND, lower extremity edema, dizziness, presyncope, syncope, or neurologic sequela. Mild dyspnea. The patient is tolerating medications without difficulties and is otherwise without complaint today.   Past Medical History  Diagnosis Date  . Macular degeneration   . Hypertension   . Paroxysmal atrial fibrillation (HCC)   . 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 Arthritis   . Cancer [facility]) 1999    Breast cancer--DCIS   Past Surgical History  Procedure Laterality Date  . Breast lumpectomy  1999    RADIATION  . Cardiac  catheterization  02/05/1999    HYPERDYNAMIC LEFT VENTRICLE WITH EF OF 75-80%  . 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 Right eye removed      Current Outpatient Prescriptions  Medication Sig Dispense Refill  . Cholecalciferol (VITAMIN D) 1000 UNITS capsule Take 1,000 Units by mouth daily.      . clobetasol cream (TEMOVATE) 0.05 % USE NIGHTLY FOR 1 MONTH 60 g 0  . diltiazem (CARDIZEM CD) 240 MG 24 hr capsule Take 1 capsule (240 mg total) by mouth 2 (two) times daily. 180 capsule 3  . fluticasone (FLONASE) 50 MCG/ACT nasal spray Place 1 spray into the nose daily.     [name] Kitchen lisinopril-hydrochlorothiazide (PRINZIDE,ZESTORETIC) 20-12.5 MG tablet Take 0.5 tablets by mouth daily.    . Loratadine (CLARITIN PO) Take 1 tablet by mouth daily as needed (allergies).     . metoprolol tartrate (LOPRESSOR) 25 MG tablet take 1/2 tablet by mouth twice a day 30 tablet 5  . Multiple Vitamins-Minerals (CENTRUM SILVER PO) Take 1 capsule by mouth daily.     . Multiple Vitamins-Minerals (PRESERVISION/LUTEIN PO) Take 1 capsule by mouth daily.     . propranolol (INDERAL) 10 MG tablet take 1 tablet by mouth if needed .MAY take 1 tablet by mouth three to four times a day . CALL PHYSICIAN WITH SYMPTOMS. 30 tablet 5  . rivaroxaban (XARELTO) 20 MG TABS tablet Take 1 tablet (20 mg total) by  mouth daily. 30 tablet 3   No current facility-administered medications for this encounter.    Allergies  Allergen Reactions  . Shellfish Allergy Hives and Swelling  . Atropine     Causes AFIB  . Ceclor [Cefaclor] Hives  . Codeine     Leg pain  . Contrast Media [Iodinated Diagnostic Agents] Hives  . Doxycycline     unknown  . Fenoprofen Calcium Swelling  . Isopto Hyoscine [Scopolamine]     Causes AFIB  . Levaquin [Levofloxacin]     Rash & causes AFIB  . Other     Naphon - swelling  . Penicillins     unknown  . Sulfa Drugs Cross Reactors     unknown  . Warfarin Sodium     Bleeding    Social History   Social History  .  Marital Status: Married    Spouse Name: N/A  . Number of Children: 3  . Years of Education: N/A   Occupational History  .     Social History Main Topics  . Smoking status: Never Smoker   . Smokeless tobacco: Not on file  . Alcohol Use: No  . Drug Use: No  . Sexual Activity: Yes    Birth Control/ Protection: Post-menopausal     Comment: 1st intercourse 33 yo-1 partner   Other Topics Concern  . 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    Family History  Problem Relation Age of Onset  . Hypertension Father   . Heart attack Father   . Parkinsonism Father   . Diabetes Brother   . Heart disease Brother   . Lung cancer Brother   . Stroke Mother     ROS- All systems are reviewed and negative except as per the HPI above  Physical Exam: Filed Vitals:   12/28/14 1417  BP: 106/62  Pulse: 82  Height: 5\' 4"  (1.626 m)  Weight: 191 lb (86.637 kg)    GEN- The patient is well appearing, alert and oriented x 3 today.   Head- normocephalic, atraumatic Eyes-  Sclera clear, conjunctiva pink Ears- hearing intact Oropharynx- clear Neck- supple, no JVP Lymph- no cervical lymphadenopathy Lungs- Clear to ausculation bilaterally, normal work of breathing Heart- Irregular rate and rhythm, no murmurs, rubs or gallops, PMI not laterally displaced GI- soft, NT, ND, + BS Extremities- no clubbing, cyanosis, or edema MS- no significant deformity or atrophy Skin- no rash or lesion Psych- euthymic mood, full affect Neuro- strength and sensation are intact  EKG- afib at 82 bpm, qrs int 90 ms, qtc453 ms Epic records reviewed creatinine calculated at 58.4 and qualifies for xarelto 20 mg a day  Assessment and Plan: 1. Afib V rates controlled, still is symptomatic Discussed with Dr. Rayann Heman Will increase xarelto to 20 mg and after fully anticoagulated x 3 weeks will cardiovert. Will bring back to office 12/9 for blood work and  DCCV scheduled  for 12/12  2. C/o chest pain and  dyspnea Likely due to recent rvr   Echo and lexi myoview, pending 11/28.  Will f/u with Dr. Rayann Heman 12/14  Geroge Baseman. Eitan Doubleday, [facility] [address] [place], [place] [address] [phone]

## 2014-12-29 ENCOUNTER — Other Ambulatory Visit (HOSPITAL_COMMUNITY): Payer: Self-pay | Admitting: *Deleted

## 2014-12-29 ENCOUNTER — Telehealth (HOSPITAL_COMMUNITY): Payer: Self-pay | Admitting: *Deleted

## 2014-12-29 MED ORDER — RIVAROXABAN 20 MG PO TABS: 20.0000 mg | ORAL_TABLET | Freq: Every day | ORAL | Status: DC

## 2014-12-29 NOTE — Telephone Encounter (Signed)
PA for xarelto 20mg  approved through 12/28/2015 PA [id number]

## 2015-01-01 ENCOUNTER — Telehealth (HOSPITAL_COMMUNITY): Payer: Self-pay | Admitting: *Deleted

## 2015-01-01 NOTE — Telephone Encounter (Signed)
Patient given detailed instructions per Myocardial Perfusion Study Information Sheet for the test on 01/08/15 at 0945. Patient notified to arrive 15 minutes early and that it is imperative to arrive on time for appointment to keep from having the test rescheduled.  If you need to cancel or reschedule your appointment, please call the office within 24 hours of your appointment. Failure to do so may result in a cancellation of your appointment, and a $50 no show fee. Patient verbalized understanding.Elisea Khader, Ranae Palms

## 2015-01-08 ENCOUNTER — Other Ambulatory Visit: Payer: Self-pay

## 2015-01-08 ENCOUNTER — Ambulatory Visit (HOSPITAL_BASED_OUTPATIENT_CLINIC_OR_DEPARTMENT_OTHER): Payer: Medicare Other

## 2015-01-08 ENCOUNTER — Ambulatory Visit (HOSPITAL_COMMUNITY): Payer: Medicare Other | Attending: Internal Medicine

## 2015-01-08 DIAGNOSIS — I340 Nonrheumatic mitral (valve) insufficiency: Secondary | ICD-10-CM | POA: Insufficient documentation

## 2015-01-08 DIAGNOSIS — I358 Other nonrheumatic aortic valve disorders: Secondary | ICD-10-CM | POA: Diagnosis not present

## 2015-01-08 DIAGNOSIS — I517 Cardiomegaly: Secondary | ICD-10-CM | POA: Insufficient documentation

## 2015-01-08 DIAGNOSIS — I77810 Thoracic aortic ectasia: Secondary | ICD-10-CM | POA: Insufficient documentation

## 2015-01-08 DIAGNOSIS — R0602 Shortness of breath: Secondary | ICD-10-CM | POA: Insufficient documentation

## 2015-01-08 DIAGNOSIS — R079 Chest pain, unspecified: Secondary | ICD-10-CM | POA: Diagnosis not present

## 2015-01-08 DIAGNOSIS — I071 Rheumatic tricuspid insufficiency: Secondary | ICD-10-CM | POA: Insufficient documentation

## 2015-01-08 DIAGNOSIS — I10 Essential (primary) hypertension: Secondary | ICD-10-CM | POA: Insufficient documentation

## 2015-01-08 DIAGNOSIS — I480 Paroxysmal atrial fibrillation: Secondary | ICD-10-CM

## 2015-01-08 LAB — MYOCARDIAL PERFUSION IMAGING
CHL CUP NUCLEAR SSS: 3
CSEPPHR: 108 {beats}/min
LHR: 0.35
LV dias vol: 65 mL
LVSYSVOL: 20 mL
NUC STRESS TID: 0.94
Rest HR: 90 {beats}/min
SDS: 0
SRS: 3

## 2015-01-08 MED ORDER — TECHNETIUM TC 99M SESTAMIBI GENERIC - CARDIOLITE
10.8000 | Freq: Once | INTRAVENOUS | Status: AC | PRN
  Administered 2015-01-08: 11 via INTRAVENOUS

## 2015-01-08 MED ORDER — REGADENOSON 0.4 MG/5ML IV SOLN
0.4000 mg | Freq: Once | INTRAVENOUS | Status: AC
  Administered 2015-01-08: 0.4 mg via INTRAVENOUS

## 2015-01-08 MED ORDER — TECHNETIUM TC 99M SESTAMIBI GENERIC - CARDIOLITE
30.0000 | Freq: Once | INTRAVENOUS | Status: AC | PRN
  Administered 2015-01-08: 30 via INTRAVENOUS

## 2015-01-10 ENCOUNTER — Ambulatory Visit
Admission: RE | Admit: 2015-01-10 | Discharge: 2015-01-10 | Disposition: A | Discharge status: Home / Self Care | Payer: Medicare Other | Source: Ambulatory Visit

## 2015-01-10 DIAGNOSIS — Z1231 Encounter for screening mammogram for malignant neoplasm of breast: Secondary | ICD-10-CM

## 2015-01-15 ENCOUNTER — Ambulatory Visit (HOSPITAL_COMMUNITY): Payer: Medicare Other | Admitting: Nurse Practitioner

## 2015-01-19 ENCOUNTER — Ambulatory Visit (HOSPITAL_COMMUNITY)
Admission: RE | Admit: 2015-01-19 | Discharge: 2015-01-19 | Disposition: A | Discharge status: Home / Self Care | Payer: Medicare Other | Source: Ambulatory Visit | Attending: Nurse Practitioner | Admitting: Nurse Practitioner

## 2015-01-19 DIAGNOSIS — I480 Paroxysmal atrial fibrillation: Secondary | ICD-10-CM | POA: Diagnosis not present

## 2015-01-19 DIAGNOSIS — I4891 Unspecified atrial fibrillation: Secondary | ICD-10-CM | POA: Insufficient documentation

## 2015-01-19 LAB — CBC
HCT: 37.7 % (ref 36.0–46.0)
Hemoglobin: 12.4 g/dL (ref 12.0–15.0)
MCH: 33.5 pg (ref 26.0–34.0)
MCHC: 32.9 g/dL (ref 30.0–36.0)
MCV: 101.9 fL — ABNORMAL HIGH (ref 78.0–100.0)
Platelets: 272 10*3/uL (ref 150–400)
RBC: 3.7 MIL/uL — ABNORMAL LOW (ref 3.87–5.11)
RDW: 14.6 % (ref 11.5–15.5)
WBC: 9.2 10*3/uL (ref 4.0–10.5)

## 2015-01-19 LAB — BASIC METABOLIC PANEL
Anion gap: 7 (ref 5–15)
BUN: 11 mg/dL (ref 6–20)
CO2: 26 mmol/L (ref 22–32)
Calcium: 9.7 mg/dL (ref 8.9–10.3)
Chloride: 107 mmol/L (ref 101–111)
Creatinine, Ser: 0.84 mg/dL (ref 0.44–1.00)
GFR calc Af Amer: >60 mL/min (ref >60)
GFR calc non Af Amer: >60 mL/min (ref >60)
Glucose, Bld: 121 mg/dL — ABNORMAL HIGH (ref 65–99)
Potassium: 4.2 mmol/L (ref 3.5–5.1)
Sodium: 140 mmol/L (ref 135–145)

## 2015-01-19 NOTE — Patient Instructions (Signed)
Cardioversion scheduled for Monday, December 12th  - Arrive at the [facility] and go to admitting at 12PM  -Do not eat or drink anything after midnight the night prior to your procedure.  - Take all your medication with a sip of water prior to arrival.  - You will not be able to drive home after your procedure.

## 2015-01-19 NOTE — Progress Notes (Addendum)
Pt's visit for Ekg and Lab work only due to dccv scheduled.     Pt continues in rate controlled afib at 82 bpm, qrs int 102 ms, qtc 462 ms. Scheduled for DCCV on Monday. Labs pending.

## 2015-01-22 ENCOUNTER — Ambulatory Visit (HOSPITAL_COMMUNITY): Payer: Medicare Other | Admitting: Certified Registered Nurse Anesthetist

## 2015-01-22 ENCOUNTER — Ambulatory Visit: Payer: Medicare Other | Admitting: Internal Medicine

## 2015-01-22 ENCOUNTER — Ambulatory Visit (HOSPITAL_COMMUNITY)
Admission: RE | Admit: 2015-01-22 | Discharge: 2015-01-22 | Disposition: A | Discharge status: Home / Self Care | Payer: Medicare Other | Source: Ambulatory Visit | Attending: Cardiology | Admitting: Cardiology

## 2015-01-22 ENCOUNTER — Encounter (HOSPITAL_COMMUNITY): Payer: Self-pay

## 2015-01-22 ENCOUNTER — Encounter (HOSPITAL_COMMUNITY)
Admission: RE | Disposition: A | Discharge status: Home / Self Care | Payer: Self-pay | Source: Ambulatory Visit | Attending: Cardiology

## 2015-01-22 DIAGNOSIS — Z91013 Allergy to seafood: Secondary | ICD-10-CM | POA: Insufficient documentation

## 2015-01-22 DIAGNOSIS — Z79899 Other long term (current) drug therapy: Secondary | ICD-10-CM | POA: Diagnosis not present

## 2015-01-22 DIAGNOSIS — I480 Paroxysmal atrial fibrillation: Secondary | ICD-10-CM | POA: Insufficient documentation

## 2015-01-22 DIAGNOSIS — Z880 Allergy status to penicillin: Secondary | ICD-10-CM | POA: Diagnosis not present

## 2015-01-22 DIAGNOSIS — Z91041 Radiographic dye allergy status: Secondary | ICD-10-CM | POA: Diagnosis not present

## 2015-01-22 DIAGNOSIS — Z7901 Long term (current) use of anticoagulants: Secondary | ICD-10-CM | POA: Insufficient documentation

## 2015-01-22 DIAGNOSIS — Z853 Personal history of malignant neoplasm of breast: Secondary | ICD-10-CM | POA: Diagnosis not present

## 2015-01-22 DIAGNOSIS — Z882 Allergy status to sulfonamides status: Secondary | ICD-10-CM | POA: Diagnosis not present

## 2015-01-22 DIAGNOSIS — Z801 Family history of malignant neoplasm of trachea, bronchus and lung: Secondary | ICD-10-CM | POA: Insufficient documentation

## 2015-01-22 DIAGNOSIS — I10 Essential (primary) hypertension: Secondary | ICD-10-CM | POA: Diagnosis not present

## 2015-01-22 DIAGNOSIS — Z8249 Family history of ischemic heart disease and other diseases of the circulatory system: Secondary | ICD-10-CM | POA: Diagnosis not present

## 2015-01-22 DIAGNOSIS — I4891 Unspecified atrial fibrillation: Secondary | ICD-10-CM | POA: Diagnosis not present

## 2015-01-22 HISTORY — PX: CARDIOVERSION: SHX1299

## 2015-01-22 SURGERY — CARDIOVERSION
Anesthesia: Monitor Anesthesia Care

## 2015-01-22 MED ORDER — LIDOCAINE HCL (CARDIAC) 20 MG/ML IV SOLN
INTRAVENOUS | Status: DC | PRN
  Administered 2015-01-22: 40 mg via INTRAVENOUS

## 2015-01-22 MED ORDER — PROPOFOL 10 MG/ML IV BOLUS
INTRAVENOUS | Status: DC | PRN
  Administered 2015-01-22: 40 mg via INTRAVENOUS
  Administered 2015-01-22: 20 mg via INTRAVENOUS

## 2015-01-22 MED ORDER — SODIUM CHLORIDE 0.9 % IR SOLN: 500.0000 mL | Freq: Once | Status: DC

## 2015-01-22 NOTE — CV Procedure (Signed)
    Cardioversion Note  SANG GENTRY [id number] Jul 02, 1930  Procedure: DC Cardioversion Indications: a-fib  Procedure Details Consent: Obtained Time Out: Verified patient identification, verified procedure, site/side was marked, verified correct patient position, special equipment/implants available, Radiology Safety Procedures followed,  medications/allergies/relevent history reviewed, required imaging and test results available.  Performed  The patient has been on adequate anticoagulation.  The patient received IV - 40 mg of lidocaine and 60 mg of propofol administered by anesthesia staff for sedation.  Synchronous cardioversion was performed at 120 and 150 joules.  The cardioversion was successful.   Complications: No apparent complications Patient did tolerate procedure well.   [name], MD, [facility] 01/22/2015, 9:55 AM

## 2015-01-22 NOTE — Anesthesia Postprocedure Evaluation (Signed)
Anesthesia Post Note  Patient: Megan Hester  Procedure(s) Performed: Procedure(s) (LRB): CARDIOVERSION (N/A)  Patient location during evaluation: Endoscopy Anesthesia Type: General Level of consciousness: awake and alert and oriented Pain management: pain level controlled Vital Signs Assessment: post-procedure vital signs reviewed and stable Respiratory status: spontaneous breathing Cardiovascular status: stable and blood pressure returned to baseline Postop Assessment: no headache Anesthetic complications: no    Last Vitals:  Filed Vitals:   01/22/15 1230  BP: 145/86  Pulse: 89  Resp: 21    Last Pain: There were no vitals filed for this visit.               Maryland Pink

## 2015-01-22 NOTE — Discharge Instructions (Signed)
Electrical Cardioversion, Care After °Refer to this sheet in the next few weeks. These instructions provide you with information on caring for yourself after your procedure. Your health care provider may also give you more specific instructions. Your treatment has been planned according to current medical practices, but problems sometimes occur. Call your health care provider if you have any problems or questions after your procedure. °WHAT TO EXPECT AFTER THE PROCEDURE °After your procedure, it is typical to have the following sensations: °· Some redness on the skin where the shocks were delivered. If this is tender, a sunburn lotion or hydrocortisone cream may help. °· Possible return of an abnormal heart rhythm within hours or days after the procedure. °HOME CARE INSTRUCTIONS °· Take medicines only as directed by your health care provider. Be sure you understand how and when to take your medicine. °· Learn how to feel your pulse and check it often. °· Limit your activity for 48 hours after the procedure or as directed by your health care provider. °· Avoid or minimize caffeine and other stimulants as directed by your health care provider. °SEEK MEDICAL CARE IF: °· You feel like your heart is beating too fast or your pulse is not regular. °· You have any questions about your medicines. °· You have bleeding that will not stop. °SEEK IMMEDIATE MEDICAL CARE IF: °· You are dizzy or feel faint. °· It is hard to breathe or you feel short of breath. °· There is a change in discomfort in your chest. °· Your speech is slurred or you have trouble moving an arm or leg on one side of your body. °· You get a serious muscle cramp that does not go away. °· Your fingers or toes turn cold or blue. °  °This information is not intended to replace advice given to you by your health care provider. Make sure you discuss any questions you have with your health care provider. °  °Document Released: 11/17/2012 Document Revised: 02/17/2014  Document Reviewed: 11/17/2012 °Elsevier Interactive Patient Education ©2016 [employer] ° °

## 2015-01-22 NOTE — Transfer of Care (Signed)
Immediate Anesthesia Transfer of Care Note  Patient: Megan Hester  Procedure(s) Performed: Procedure(s): CARDIOVERSION (N/A)  Patient Location: Endoscopy Unit  Anesthesia Type:General  Level of Consciousness: awake, alert  and oriented  Airway & Oxygen Therapy: Patient Spontanous Breathing  Post-op Assessment: Report given to RN and Post -op Vital signs reviewed and stable  Post vital signs: Reviewed and stable  Last Vitals:  Filed Vitals:   01/22/15 1230  BP: 145/86  Pulse: 89  Resp: 21    Complications: No apparent anesthesia complications

## 2015-01-22 NOTE — H&P (View-Only) (Signed)
Pt's visit for Ekg and Lab work only due to dccv scheduled.     Pt continues in rate controlled afib at 82 bpm, qrs int 102 ms, qtc 462 ms. Scheduled for DCCV on Monday. Labs pending.

## 2015-01-22 NOTE — Anesthesia Preprocedure Evaluation (Signed)
Anesthesia Evaluation  Patient identified by MRN, date of birth, ID band Patient awake    Reviewed: Allergy & Precautions, NPO status , Patient's Chart, lab work & pertinent test results, reviewed documented beta blocker date and time   History of Anesthesia Complications Negative for: history of anesthetic complications  Airway Mallampati: III  TM Distance: >3 FB Neck ROM: Full    Dental  (+) Teeth Intact   Pulmonary neg pulmonary ROS,    breath sounds clear to auscultation       Cardiovascular hypertension, Pt. on medications and Pt. on home beta blockers + dysrhythmias Atrial Fibrillation  Rhythm:Irregular     Neuro/Psych negative neurological ROS  negative psych ROS   GI/Hepatic negative GI ROS,   Endo/Other  Morbid obesity  Renal/GU negative Renal ROS     Musculoskeletal  (+) Arthritis ,   Abdominal   Peds  Hematology Anticoagulated for afib   Anesthesia Other Findings   Reproductive/Obstetrics                             Anesthesia Physical Anesthesia Plan  ASA: III  Anesthesia Plan: MAC   Post-op Pain Management:    Induction: Intravenous  Airway Management Planned: Nasal Cannula, Natural Airway and Simple Face Mask  Additional Equipment: None  Intra-op Plan:   Post-operative Plan:   Informed Consent: I have reviewed the patients History and Physical, chart, labs and discussed the procedure including the risks, benefits and alternatives for the proposed anesthesia with the patient or authorized representative who has indicated his/her understanding and acceptance.   Dental advisory given  Plan Discussed with: CRNA and Surgeon  Anesthesia Plan Comments:         Anesthesia Quick Evaluation

## 2015-01-22 NOTE — Interval H&P Note (Signed)
History and Physical Interval Note:  01/22/2015 9:54 AM  Megan Hester  has presented today for surgery, with the diagnosis of AFIB  The various methods of treatment have been discussed with the patient and family. After consideration of risks, benefits and other options for treatment, the patient has consented to  Procedure(s): CARDIOVERSION (N/A) as a surgical intervention .  The patient's history has been reviewed, patient examined, no change in status, stable for surgery.  I have reviewed the patient's chart and labs.  Questions were answered to the patient's satisfaction.     [name]

## 2015-01-23 ENCOUNTER — Encounter (HOSPITAL_COMMUNITY): Payer: Self-pay | Admitting: Cardiology

## 2015-01-24 ENCOUNTER — Ambulatory Visit (INDEPENDENT_AMBULATORY_CARE_PROVIDER_SITE_OTHER): Payer: Medicare Other | Admitting: Internal Medicine

## 2015-01-24 ENCOUNTER — Encounter: Payer: Self-pay | Admitting: Internal Medicine

## 2015-01-24 VITALS — BP 124/78 | HR 91 | Ht 64.0 in | Wt 190.8 lb

## 2015-01-24 DIAGNOSIS — I10 Essential (primary) hypertension: Secondary | ICD-10-CM

## 2015-01-24 DIAGNOSIS — I480 Paroxysmal atrial fibrillation: Secondary | ICD-10-CM | POA: Diagnosis not present

## 2015-01-24 MED ORDER — FLECAINIDE ACETATE 50 MG PO TABS: 50.0000 mg | ORAL_TABLET | Freq: Two times a day (BID) | ORAL | Status: DC

## 2015-01-24 NOTE — Patient Instructions (Signed)
Medication Instructions:  Your physician has recommended you make the following change in your medication:  1) Start Flecainide 50 mg twice daily   Labwork: None ordered   Testing/Procedures: None ordered   Follow-Up: Your physician recommends that you schedule a follow-up appointment in: 1-2 weeks with Roderic Palau, NP    Any Other Special Instructions Will Be Listed Below (If Applicable).     If you need a refill on your cardiac medications before your next appointment, please call your pharmacy.

## 2015-01-26 NOTE — Progress Notes (Signed)
PCP: Geoffery Lyons, MD  Megan Hester is a 81 y.o. female with a h/o atrial fibrillation who presents today for follow-up.  She has quickly returned to afib.  Today, she denies symptoms of orthopnea, PND, lower extremity edema, dizziness, presyncope, syncope, or neurologic sequela.  She is tolerating xarelto without significant bleeding.  Past Medical History  Diagnosis Date  . Macular degeneration   . Hypertension   . Paroxysmal atrial fibrillation (HCC)   . 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 Arthritis   . Cancer [facility]) 1999    Breast cancer--DCIS   Past Surgical History  Procedure Laterality Date  . Breast lumpectomy  1999    RADIATION  . Cardiac catheterization  02/05/1999    HYPERDYNAMIC LEFT VENTRICLE WITH EF OF 75-80%  . 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 Right eye removed    . Cardioversion N/A 01/22/2015    Procedure: CARDIOVERSION;  Surgeon: [name], MD;  Location: [facility] ENDOSCOPY;  Service: Cardiovascular;  Laterality: N/A;    Current Outpatient Prescriptions  Medication Sig Dispense Refill  . Cholecalciferol (VITAMIN D) 1000 UNITS capsule Take 1,000 Units by mouth daily.      . clobetasol cream (TEMOVATE) 0.05 % USE NIGHTLY FOR 1 MONTH 60 g 0  . diltiazem (CARDIZEM CD) 240 MG 24 hr capsule Take 1 capsule (240 mg total) by mouth 2 (two) times daily. 180 capsule 3  . docusate sodium (COLACE) 100 MG capsule Take 300 mg by mouth daily.    . fexofenadine (ALLEGRA) 180 MG tablet Take 180 mg by mouth daily.    . fluticasone (FLONASE) 50 MCG/ACT nasal spray Place 1 spray into both nostrils daily.     [name] Kitchen lisinopril-hydrochlorothiazide (PRINZIDE,ZESTORETIC) 20-12.5 MG tablet Take 0.5 tablets by mouth daily.    . metoprolol tartrate (LOPRESSOR) 25 MG tablet take 1/2 tablet by mouth twice a day 30 tablet 5  . Multiple Vitamins-Minerals (CENTRUM SILVER PO) Take 1 capsule by mouth daily.     . Multiple Vitamins-Minerals (PRESERVISION/LUTEIN PO)  Take 1 capsule by mouth daily.     . propranolol (INDERAL) 10 MG tablet take 1 tablet by mouth if needed .MAY take 1 tablet by mouth three to four times a day . CALL PHYSICIAN WITH SYMPTOMS. 30 tablet 5  . rivaroxaban (XARELTO) 20 MG TABS tablet Take 1 tablet (20 mg total) by mouth daily. 30 tablet 1  . flecainide (TAMBOCOR) 50 MG tablet Take 1 tablet (50 mg total) by mouth 2 (two) times daily. 180 tablet 3   No current facility-administered medications for this visit.    Allergies  Allergen Reactions  . Shellfish Allergy Hives and Swelling  . Atropine     Causes AFIB  . Ceclor [Cefaclor] Hives  . Codeine     Leg pain  . Contrast Media [Iodinated Diagnostic Agents] Hives  . Doxycycline     unknown  . Fenoprofen     unknown  . Fenoprofen Calcium Swelling  . Garlic     Other reaction(s): Bleeding (intolerance)  . Isopto Hyoscine [Scopolamine]     Causes AFIB  . Levaquin [Levofloxacin]     Rash & causes AFIB  . Other     Other reaction(s): Bleeding (intolerance) Nutrasweet Naphon - swelling  . Oxycodone      Depression  . Penicillins     Has patient had a PCN reaction causing immediate rash, facial/tongue/throat swelling, SOB or lightheadedness with hypotension:  unknown Has patient had a PCN reaction causing severe rash involving mucus membranes or skin necrosis: unknown Has patient had a PCN reaction that required hospitalization unknown Has patient had a PCN reaction occurring within the last 10 years:no If all of the above answers are "NO", then may proceed with Cephalosporin use. unknown  . Sulfa Drugs Cross Reactors     unknown  . Warfarin     Other reaction(s): Bleeding (intolerance) From eyes  . Warfarin Sodium     Bleeding  . Tape Rash    Use paper tape    Social History   Social History  . Marital Status: Married    Spouse Name: N/A  . Number of Children: 3  . Years of Education: N/A   Occupational History  .     Social History Main Topics  .  Smoking status: Never Smoker   . Smokeless tobacco: Not on file  . Alcohol Use: No  . Drug Use: No  . Sexual Activity: Yes    Birth Control/ Protection: Post-menopausal     Comment: 1st intercourse 41 yo-1 partner   Other Topics Concern  . 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    Family History  Problem Relation Age of Onset  . Hypertension Father   . Heart attack Father   . Parkinsonism Father   . Diabetes Brother   . Heart disease Brother   . Lung cancer Brother   . Stroke Mother    ROS-   All other systems are reviewed and negative except as per HPI above  Physical Exam: Filed Vitals:   01/24/15 1203  BP: 124/78  Pulse: 91  Height: 5\' 4"  (1.626 m)  Weight: 190 lb 12.8 oz (86.546 kg)    GEN- The patient is well appearing, alert and oriented x 3 today.   Head- normocephalic, atraumatic Eyes-  Prosthetic R eye Ears- hearing intact Oropharynx- clear Neck- supple, no JVP Lungs- Clear to ausculation bilaterally, normal work of breathing Heart- tachycardic irregular rhythm GI- soft, NT, ND, + BS Extremities- no clubbing, cyanosis, or edema Neuro- strength and sensation are intact  ekg today reveals afib   Assessment and Plan:  1. Persistent atrial fibrillation Start flecainide 50mg  BID Follow-up in AF clinic If remains in afib, would consider increasing flecainide to 100mg  BID and repeating cardioversion On xarelto  2. HTN Stable No change required today   This is a complex patient with a high level of decision making required.  Given uncontrolled AF, she is at risk for decompensation/ hospitalization.    [name] MD, [facility] 01/26/2015 11:08 AM

## 2015-01-31 ENCOUNTER — Telehealth: Payer: Self-pay | Admitting: Internal Medicine

## 2015-01-31 MED ORDER — POTASSIUM CHLORIDE CRYS ER 20 MEQ PO TBCR: 20.0000 meq | EXTENDED_RELEASE_TABLET | Freq: Every day | ORAL | Status: DC

## 2015-01-31 MED ORDER — FUROSEMIDE 40 MG PO TABS: 40.0000 mg | ORAL_TABLET | Freq: Every day | ORAL | Status: DC

## 2015-01-31 NOTE — Telephone Encounter (Signed)
Discussed with Dr Lovena Le and will call in Furosemide 40 mg daily and Kdur 28meq daily to help with swelling.  She will keep her follow up appointment for Thurs in the afib clinic and call back if she has worsening syptoms

## 2015-01-31 NOTE — Telephone Encounter (Addendum)
Noticed the lst few days she has swelling in her legs.  They do not go down during the night.  No change during the day in swelling.  She was just started on Flecainide 50 mg twice daily last Thurs and has a follow up on 02/08/15 with Roderic Palau, NP in [place] clinic.  She was just worried as this is a new medication and she is "very sensitive" to medications.  I let her know I would discuss with Dr Lovena Le and call her back after clinic.

## 2015-01-31 NOTE — Telephone Encounter (Signed)
New message     Patient calling    Pt c/o swelling: STAT is pt has developed SOB within 24 hours  1. How long have you been experiencing swelling? Monday   2. Where is the swelling located? Right foot is worse than left , tingling in legs   3.  Are you currently taking a "fluid pill"? No    4.  Are you currently SOB? A little worse when laying down - did not hear sob on the phone    5.  Have you traveled recently? No

## 2015-02-08 ENCOUNTER — Ambulatory Visit (HOSPITAL_COMMUNITY)
Admission: RE | Admit: 2015-02-08 | Discharge: 2015-02-08 | Disposition: A | Discharge status: Home / Self Care | Payer: Medicare Other | Source: Ambulatory Visit | Attending: Nurse Practitioner | Admitting: Nurse Practitioner

## 2015-02-08 ENCOUNTER — Encounter (HOSPITAL_COMMUNITY): Payer: Self-pay | Admitting: Nurse Practitioner

## 2015-02-08 VITALS — BP 104/60 | HR 83 | Ht 64.0 in | Wt 189.0 lb

## 2015-02-08 DIAGNOSIS — I481 Persistent atrial fibrillation: Secondary | ICD-10-CM | POA: Insufficient documentation

## 2015-02-08 DIAGNOSIS — I10 Essential (primary) hypertension: Secondary | ICD-10-CM | POA: Diagnosis not present

## 2015-02-08 DIAGNOSIS — I4819 Other persistent atrial fibrillation: Secondary | ICD-10-CM

## 2015-02-08 NOTE — Addendum Note (Signed)
Encounter addended by: Orland Penman, CMA on: 02/08/2015  4:15 PM<BR>     Documentation filed: Medications

## 2015-02-08 NOTE — Progress Notes (Signed)
Patient ID: Megan Hester, female   DOB: 11/19/1930, 81 y.o.   MRN: [id number]     Primary Care Physician: Geoffery Lyons, MD Referring Physician:Dr. Roxanne Mins Rumbold is a 81 y.o. female with a h/o persistent afib that recently had a cardioversion with ERAF after 36 hours of SR. She had f/u with Dr. Rayann Heman and has been placed on flecainide 50 mg bid. She is being seen in the afib clinic today and per Dr. Jackalyn Lombard plan if she continued with afib, increase flecainide to 100 mg bid and set up for repeat cardioversion.  She is tolerating flecainide well and is being compliant with xarelto.  Today, she denies symptoms of palpitations, chest pain, shortness of breath, orthopnea, PND, lower extremity edema, dizziness, presyncope, syncope, or neurologic sequela. The patient is tolerating medications without difficulties and is otherwise without complaint today.   Past Medical History  Diagnosis Date  . Macular degeneration   . Hypertension   . Paroxysmal atrial fibrillation (HCC)   . 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 Arthritis   . Cancer [facility]) 1999    Breast cancer--DCIS   Past Surgical History  Procedure Laterality Date  . Breast lumpectomy  1999    RADIATION  . Cardiac catheterization  02/05/1999    HYPERDYNAMIC LEFT VENTRICLE WITH EF OF 75-80%  . 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 Right eye removed    . Cardioversion N/A 01/22/2015    Procedure: CARDIOVERSION;  Surgeon: [name], MD;  Location: [facility] ENDOSCOPY;  Service: Cardiovascular;  Laterality: N/A;    Current Outpatient Prescriptions  Medication Sig Dispense Refill  . Cholecalciferol (VITAMIN D) 1000 UNITS capsule Take 1,000 Units by mouth daily.      . clobetasol cream (TEMOVATE) 0.05 % USE NIGHTLY FOR 1 MONTH 60 g 0  . diltiazem (CARDIZEM CD) 240 MG 24 hr capsule Take 1 capsule (240 mg total) by mouth 2 (two) times daily. 180 capsule 3  . docusate sodium (COLACE) 100 MG capsule  Take 300 mg by mouth daily.    . fexofenadine (ALLEGRA) 180 MG tablet Take 180 mg by mouth daily.    . flecainide (TAMBOCOR) 50 MG tablet Take 1 tablet (50 mg total) by mouth 2 (two) times daily. 180 tablet 3  . fluticasone (FLONASE) 50 MCG/ACT nasal spray Place 1 spray into both nostrils daily.     . furosemide (LASIX) 40 MG tablet Take 1 tablet (40 mg total) by mouth daily. 90 tablet 3  . lisinopril-hydrochlorothiazide (PRINZIDE,ZESTORETIC) 20-12.5 MG tablet Take 0.5 tablets by mouth daily.    . metoprolol tartrate (LOPRESSOR) 25 MG tablet take 1/2 tablet by mouth twice a day 30 tablet 5  . Multiple Vitamins-Minerals (CENTRUM SILVER PO) Take 1 capsule by mouth daily.     . Multiple Vitamins-Minerals (PRESERVISION/LUTEIN PO) Take 1 capsule by mouth daily.     . potassium chloride SA (K-DUR,KLOR-CON) 20 MEQ tablet Take 1 tablet (20 mEq total) by mouth daily. 90 tablet 3  . propranolol (INDERAL) 10 MG tablet take 1 tablet by mouth if needed .MAY take 1 tablet by mouth three to four times a day . CALL PHYSICIAN WITH SYMPTOMS. 30 tablet 5  . rivaroxaban (XARELTO) 20 MG TABS tablet Take 1 tablet (20 mg total) by mouth daily. 30 tablet 1   No current facility-administered medications for this encounter.    Allergies  Allergen Reactions  . Shellfish Allergy  Hives and Swelling  . Atropine     Causes AFIB  . Ceclor [Cefaclor] Hives  . Codeine     Leg pain  . Contrast Media [Iodinated Diagnostic Agents] Hives  . Doxycycline     unknown  . Fenoprofen     unknown  . Fenoprofen Calcium Swelling  . Garlic     Other reaction(s): Bleeding (intolerance)  . Isopto Hyoscine [Scopolamine]     Causes AFIB  . Levaquin [Levofloxacin]     Rash & causes AFIB  . Other     Other reaction(s): Bleeding (intolerance) Nutrasweet Naphon - swelling  . Oxycodone      Depression  . Penicillins     Has patient had a PCN reaction causing immediate rash, facial/tongue/throat swelling, SOB or lightheadedness  with hypotension: unknown Has patient had a PCN reaction causing severe rash involving mucus membranes or skin necrosis: unknown Has patient had a PCN reaction that required hospitalization unknown Has patient had a PCN reaction occurring within the last 10 years:no If all of the above answers are "NO", then may proceed with Cephalosporin use. unknown  . Sulfa Drugs Cross Reactors     unknown  . Warfarin     Other reaction(s): Bleeding (intolerance) From eyes  . Warfarin Sodium     Bleeding  . Tape Rash    Use paper tape    Social History   Social History  . Marital Status: Married    Spouse Name: N/A  . Number of Children: 3  . Years of Education: N/A   Occupational History  .     Social History Main Topics  . Smoking status: Never Smoker   . Smokeless tobacco: Not on file  . Alcohol Use: No  . Drug Use: No  . Sexual Activity: Yes    Birth Control/ Protection: Post-menopausal     Comment: 1st intercourse 74 yo-1 partner   Other Topics Concern  . 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    Family History  Problem Relation Age of Onset  . Hypertension Father   . Heart attack Father   . Parkinsonism Father   . Diabetes Brother   . Heart disease Brother   . Lung cancer Brother   . Stroke Mother     ROS- All systems are reviewed and negative except as per the HPI above  Physical Exam: Filed Vitals:   02/08/15 1408  BP: 104/60  Pulse: 83  Height: 5\' 4"  (1.626 m)  Weight: 189 lb (85.73 kg)    GEN- The patient is well appearing, alert and oriented x 3 today.   Head- normocephalic, atraumatic Eyes-  Sclera clear, conjunctiva pink, prosthetic rt eye Ears- hearing intact Oropharynx- clear Neck- supple, no JVP Lymph- no cervical lymphadenopathy Lungs- Clear to ausculation bilaterally, normal work of breathing Heart-Irregular rate and rhythm, no murmurs, rubs or gallops, PMI not laterally displaced GI-  soft, NT, ND, + BS Extremities- no clubbing, cyanosis, or edema MS- no significant deformity or atrophy Skin- no rash or lesion Psych- euthymic mood, full affect Neuro- strength and sensation are intact  EKG- afib with v rate of 83 bpm, qrs int 116 ms, qtc 376 ms Epic records reviewed  Assessment and Plan: 1.persistent symptomatic afib Increase flecainide to 100 mg bid  Will see back in one week and if still in afib will set up for cardioversion Reminded not to forget any doses of xarelto  2.  HTN Stable  Donna C. Carroll, [facility] [address] [place], [place] [address] [phone]

## 2015-02-15 ENCOUNTER — Ambulatory Visit (HOSPITAL_COMMUNITY)
Admission: RE | Admit: 2015-02-15 | Discharge: 2015-02-15 | Disposition: A | Discharge status: Home / Self Care | Payer: Medicare Other | Source: Ambulatory Visit | Attending: Nurse Practitioner | Admitting: Nurse Practitioner

## 2015-02-15 VITALS — BP 102/72 | HR 78 | Ht 64.0 in | Wt 192.2 lb

## 2015-02-15 DIAGNOSIS — I481 Persistent atrial fibrillation: Secondary | ICD-10-CM

## 2015-02-15 DIAGNOSIS — I4819 Other persistent atrial fibrillation: Secondary | ICD-10-CM

## 2015-02-15 DIAGNOSIS — I4891 Unspecified atrial fibrillation: Secondary | ICD-10-CM | POA: Insufficient documentation

## 2015-02-16 ENCOUNTER — Encounter (HOSPITAL_COMMUNITY): Payer: Self-pay | Admitting: Nurse Practitioner

## 2015-02-16 ENCOUNTER — Other Ambulatory Visit (HOSPITAL_COMMUNITY): Payer: Self-pay | Admitting: *Deleted

## 2015-02-16 ENCOUNTER — Ambulatory Visit (HOSPITAL_COMMUNITY): Payer: Medicare Other | Admitting: Nurse Practitioner

## 2015-02-16 NOTE — Progress Notes (Signed)
Patient ID: Megan Hester, female   DOB: 1930-12-06, 82 y.o.   MRN: [id number]     Primary Care Physician: Geoffery Lyons, MD Referring Physician: Dr. Gean Birchwood is a 82 y.o. female with a h/o persistent afib that is back today for f/u after one cardioversion with ERAF and then loading on flecainide. Unfortunately, she remains in afib but is rate controlled. She is tolerating afib. Being complaint with xarelto. She will be scheduled for repeat cardioversion.  Today, she denies symptoms of palpitations, chest pain, shortness of breath, orthopnea, PND, lower extremity edema, dizziness, presyncope, syncope, or neurologic sequela. The patient is tolerating medications without difficulties and is otherwise without complaint today.   Past Medical History  Diagnosis Date  . Macular degeneration   . Hypertension   . Paroxysmal atrial fibrillation (HCC)   . 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 Arthritis   . Cancer [facility]) 1999    Breast cancer--DCIS   Past Surgical History  Procedure Laterality Date  . Breast lumpectomy  1999    RADIATION  . Cardiac catheterization  02/05/1999    HYPERDYNAMIC LEFT VENTRICLE WITH EF OF 75-80%  . 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 Right eye removed    . Cardioversion N/A 01/22/2015    Procedure: CARDIOVERSION;  Surgeon: [name], MD;  Location: [facility] ENDOSCOPY;  Service: Cardiovascular;  Laterality: N/A;    Current Outpatient Prescriptions  Medication Sig Dispense Refill  . Cholecalciferol (VITAMIN D) 1000 UNITS capsule Take 1,000 Units by mouth daily.      . clobetasol cream (TEMOVATE) 0.05 % USE NIGHTLY FOR 1 MONTH 60 g 0  . diltiazem (CARDIZEM CD) 240 MG 24 hr capsule Take 1 capsule (240 mg total) by mouth 2 (two) times daily. 180 capsule 3  . docusate sodium (COLACE) 100 MG capsule Take 300 mg by mouth daily.    . fexofenadine (ALLEGRA) 180 MG tablet Take 180 mg by mouth daily.    . flecainide  (TAMBOCOR) 50 MG tablet Take 1 tablet (50 mg total) by mouth 2 (two) times daily. (Patient taking differently: Take 100 mg by mouth 2 (two) times daily. ) 180 tablet 3  . fluticasone (FLONASE) 50 MCG/ACT nasal spray Place 1 spray into both nostrils daily.     . furosemide (LASIX) 40 MG tablet Take 1 tablet (40 mg total) by mouth daily. 90 tablet 3  . lisinopril-hydrochlorothiazide (PRINZIDE,ZESTORETIC) 20-12.5 MG tablet Take 0.5 tablets by mouth daily.    . metoprolol tartrate (LOPRESSOR) 25 MG tablet take 1/2 tablet by mouth twice a day 30 tablet 5  . Multiple Vitamins-Minerals (CENTRUM SILVER PO) Take 1 capsule by mouth daily.     . Multiple Vitamins-Minerals (PRESERVISION/LUTEIN PO) Take 1 capsule by mouth daily.     . potassium chloride SA (K-DUR,KLOR-CON) 20 MEQ tablet Take 1 tablet (20 mEq total) by mouth daily. 90 tablet 3  . propranolol (INDERAL) 10 MG tablet take 1 tablet by mouth if needed .MAY take 1 tablet by mouth three to four times a day . CALL PHYSICIAN WITH SYMPTOMS. 30 tablet 5  . rivaroxaban (XARELTO) 20 MG TABS tablet Take 1 tablet (20 mg total) by mouth daily. 30 tablet 1   No current facility-administered medications for this encounter.    Allergies  Allergen Reactions  . Shellfish Allergy Hives and Swelling  . Atropine     Causes AFIB  . Ceclor [Cefaclor]  Hives  . Codeine     Leg pain  . Contrast Media [Iodinated Diagnostic Agents] Hives  . Doxycycline     unknown  . Fenoprofen     unknown  . Fenoprofen Calcium Swelling  . Garlic     Other reaction(s): Bleeding (intolerance)  . Isopto Hyoscine [Scopolamine]     Causes AFIB  . Levaquin [Levofloxacin]     Rash & causes AFIB  . Other     Other reaction(s): Bleeding (intolerance) Nutrasweet Naphon - swelling  . Oxycodone      Depression  . Penicillins     Has patient had a PCN reaction causing immediate rash, facial/tongue/throat swelling, SOB or lightheadedness with hypotension: unknown Has patient had a  PCN reaction causing severe rash involving mucus membranes or skin necrosis: unknown Has patient had a PCN reaction that required hospitalization unknown Has patient had a PCN reaction occurring within the last 10 years:no If all of the above answers are "NO", then may proceed with Cephalosporin use. unknown  . Sulfa Drugs Cross Reactors     unknown  . Warfarin     Other reaction(s): Bleeding (intolerance) From eyes  . Warfarin Sodium     Bleeding  . Tape Rash    Use paper tape    Social History   Social History  . Marital Status: Married    Spouse Name: N/A  . Number of Children: 3  . Years of Education: N/A   Occupational History  .     Social History Main Topics  . Smoking status: Never Smoker   . Smokeless tobacco: Not on file  . Alcohol Use: No  . Drug Use: No  . Sexual Activity: Yes    Birth Control/ Protection: Post-menopausal     Comment: 1st intercourse 20 yo-1 partner   Other Topics Concern  . 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    Family History  Problem Relation Age of Onset  . Hypertension Father   . Heart attack Father   . Parkinsonism Father   . Diabetes Brother   . Heart disease Brother   . Lung cancer Brother   . Stroke Mother     ROS- All systems are reviewed and negative except as per the HPI above  Physical Exam: Filed Vitals:   02/15/15 1503  BP: 102/72  Pulse: 78  Height: 5\' 4"  (1.626 m)  Weight: 192 lb 3.2 oz (87.181 kg)    GEN- The patient is well appearing, alert and oriented x 3 today.   Head- normocephalic, atraumatic Eyes-  Sclera clear, conjunctiva pink Ears- hearing intact Oropharynx- clear Neck- supple, no JVP Lymph- no cervical lymphadenopathy Lungs- Clear to ausculation bilaterally, normal work of breathing Heart- Irregular rate and rhythm, no murmurs, rubs or gallops, PMI not laterally displaced GI- soft, NT, ND, + BS Extremities- no clubbing, cyanosis, or  edema MS- no significant deformity or atrophy Skin- no rash or lesion Psych- euthymic mood, full affect Neuro- strength and sensation are intact  EKG- afib with v rate 68 bp,. qrs int 104 ms, qtc 497 ms. Epic records reviewed.  Assessment and Plan: 1. Persistent afib On flecainide 100 mg bid with failure for chemical conversion Will schedule for cardioversion No missed doses of xarelto  Bmet/cbc  F/u one week s/p cardioversion  Butch Penny C. Garnett Rekowski, [facility] [address] [place], [place] [address] [phone]

## 2015-02-21 ENCOUNTER — Ambulatory Visit (HOSPITAL_COMMUNITY)
Admission: RE | Admit: 2015-02-21 | Discharge: 2015-02-21 | Disposition: A | Discharge status: Home / Self Care | Payer: Medicare Other | Source: Ambulatory Visit | Attending: Nurse Practitioner | Admitting: Nurse Practitioner

## 2015-02-21 DIAGNOSIS — I480 Paroxysmal atrial fibrillation: Secondary | ICD-10-CM

## 2015-02-21 LAB — BASIC METABOLIC PANEL
ANION GAP: 8 (ref 5–15)
BUN: 11 mg/dL (ref 6–20)
CHLORIDE: 107 mmol/L (ref 101–111)
CO2: 26 mmol/L (ref 22–32)
Calcium: 9.5 mg/dL (ref 8.9–10.3)
Creatinine, Ser: 0.8 mg/dL (ref 0.44–1.00)
GFR calc non Af Amer: >60 mL/min (ref >60)
Glucose, Bld: 126 mg/dL — ABNORMAL HIGH (ref 65–99)
POTASSIUM: 4 mmol/L (ref 3.5–5.1)
Sodium: 141 mmol/L (ref 135–145)

## 2015-02-21 LAB — CBC
HEMATOCRIT: 37.1 % (ref 36.0–46.0)
HEMOGLOBIN: 12.1 g/dL (ref 12.0–15.0)
MCH: 33.3 pg (ref 26.0–34.0)
MCHC: 32.6 g/dL (ref 30.0–36.0)
MCV: 102.2 fL — ABNORMAL HIGH (ref 78.0–100.0)
Platelets: 262 10*3/uL (ref 150–400)
RBC: 3.63 MIL/uL — ABNORMAL LOW (ref 3.87–5.11)
RDW: 14.9 % (ref 11.5–15.5)
WBC: 8.7 10*3/uL (ref 4.0–10.5)

## 2015-02-21 NOTE — Progress Notes (Signed)
Pt here for EKG prior to scheduled cardioversion to confirm AFib. Roderic Palau NP to review EKG

## 2015-02-22 ENCOUNTER — Ambulatory Visit (HOSPITAL_COMMUNITY)
Admission: RE | Admit: 2015-02-22 | Discharge: 2015-02-22 | Disposition: A | Discharge status: Home / Self Care | Payer: Medicare Other | Source: Ambulatory Visit | Attending: Cardiology | Admitting: Cardiology

## 2015-02-22 ENCOUNTER — Encounter (HOSPITAL_COMMUNITY): Payer: Self-pay | Admitting: *Deleted

## 2015-02-22 ENCOUNTER — Ambulatory Visit (HOSPITAL_COMMUNITY): Payer: Medicare Other | Admitting: Anesthesiology

## 2015-02-22 ENCOUNTER — Encounter (HOSPITAL_COMMUNITY)
Admission: RE | Disposition: A | Discharge status: Home / Self Care | Payer: Self-pay | Source: Ambulatory Visit | Attending: Cardiology

## 2015-02-22 DIAGNOSIS — I481 Persistent atrial fibrillation: Secondary | ICD-10-CM | POA: Diagnosis not present

## 2015-02-22 DIAGNOSIS — Z7901 Long term (current) use of anticoagulants: Secondary | ICD-10-CM | POA: Diagnosis not present

## 2015-02-22 DIAGNOSIS — I471 Supraventricular tachycardia: Secondary | ICD-10-CM | POA: Insufficient documentation

## 2015-02-22 DIAGNOSIS — I10 Essential (primary) hypertension: Secondary | ICD-10-CM | POA: Diagnosis not present

## 2015-02-22 DIAGNOSIS — Z79899 Other long term (current) drug therapy: Secondary | ICD-10-CM | POA: Insufficient documentation

## 2015-02-22 DIAGNOSIS — Z7951 Long term (current) use of inhaled steroids: Secondary | ICD-10-CM | POA: Diagnosis not present

## 2015-02-22 DIAGNOSIS — I480 Paroxysmal atrial fibrillation: Secondary | ICD-10-CM | POA: Diagnosis not present

## 2015-02-22 HISTORY — PX: CARDIOVERSION: SHX1299

## 2015-02-22 SURGERY — CARDIOVERSION
Anesthesia: Monitor Anesthesia Care

## 2015-02-22 MED ORDER — SODIUM CHLORIDE 0.9 % IV SOLN
INTRAVENOUS | Status: DC
  Administered 2015-02-22: 500 mL via INTRAVENOUS

## 2015-02-22 MED ORDER — DILTIAZEM HCL ER COATED BEADS 240 MG PO CP24
240.0000 mg | ORAL_CAPSULE | Freq: Every morning | ORAL | Status: DC
Start: 2015-02-22 — End: 2016-01-17

## 2015-02-22 MED ORDER — GLYCOPYRROLATE 0.2 MG/ML IJ SOLN
INTRAMUSCULAR | Status: DC | PRN
  Administered 2015-02-22: 0.1 mg via INTRAVENOUS

## 2015-02-22 NOTE — H&P (Addendum)
[employer] Hillebrand  02/15/2015 3:00 PM  ATRIAL FIB OFFICE VISIT  MRN:  [id number]   Description: 82 year old female  Provider: Sherran Needs, NP  Department: [facility]       Diagnoses     Persistent atrial fibrillation [facility]) - Primary    ICD-9-CM: 427.31 ICD-10-CM: I48.1       Reason for Visit     Atrial Fibrillation    Reason for Visit History        Current Vitals  Most recent update: 02/15/2015 3:03 PM by Orland Penman, CMA    BP Pulse Ht Wt BMI    102/72 mmHg 78 5\' 4"  (1.626 m) 192 lb 3.2 oz (87.181 kg) 32.97 kg/m2      BMI Data     Body Mass Index Body Surface Area    32.97 kg/m 2 1.98 m 2      Progress Notes      Sherran Needs, NP at 02/15/2015 11:59 PM     Status: Signed       Expand All Collapse All   Patient ID: Sherrilee Gilles, female DOB: Mar 15, 1930, 82 y.o. MRN: [id number]     Primary Care Physician: Geoffery Lyons, MD Referring Physician: Dr. Gean Birchwood is a 82 y.o. female with a h/o persistent afib that is back today for f/u after one cardioversion with ERAF and then loading on flecainide. Unfortunately, she remains in afib but is rate controlled. She is tolerating afib. Being complaint with xarelto. She will be scheduled for repeat cardioversion.  Today, she denies symptoms of palpitations, chest pain, shortness of breath, orthopnea, PND, lower extremity edema, dizziness, presyncope, syncope, or neurologic sequela. The patient is tolerating medications without difficulties and is otherwise without complaint today.   Past Medical History  Diagnosis Date  . Macular degeneration   . Hypertension   . Paroxysmal atrial fibrillation (HCC)   . 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 Arthritis   . Cancer [facility]) 1999    Breast cancer--DCIS   Past Surgical History  Procedure Laterality Date  . Breast lumpectomy  1999    RADIATION  .  Cardiac catheterization  02/05/1999    HYPERDYNAMIC LEFT VENTRICLE WITH EF OF 75-80%  . 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 Right eye removed    . Cardioversion N/A 01/22/2015    Procedure: CARDIOVERSION; Surgeon: [name], MD; Location: [facility] ENDOSCOPY; Service: Cardiovascular; Laterality: N/A;    Current Outpatient Prescriptions  Medication Sig Dispense Refill  . Cholecalciferol (VITAMIN D) 1000 UNITS capsule Take 1,000 Units by mouth daily.     . clobetasol cream (TEMOVATE) 0.05 % USE NIGHTLY FOR 1 MONTH 60 g 0  . diltiazem (CARDIZEM CD) 240 MG 24 hr capsule Take 1 capsule (240 mg total) by mouth 2 (two) times daily. 180 capsule 3  . docusate sodium (COLACE) 100 MG capsule Take 300 mg by mouth daily.    . fexofenadine (ALLEGRA) 180 MG tablet Take 180 mg by mouth daily.    . flecainide (TAMBOCOR) 50 MG tablet Take 1 tablet (50 mg total) by mouth 2 (two) times daily. (Patient taking differently: Take 100 mg by mouth 2 (two) times daily. ) 180 tablet 3  . fluticasone (FLONASE) 50 MCG/ACT nasal spray Place 1 spray into both nostrils daily.     . furosemide (LASIX) 40 MG tablet Take 1 tablet (40 mg total) by  mouth daily. 90 tablet 3  . lisinopril-hydrochlorothiazide (PRINZIDE,ZESTORETIC) 20-12.5 MG tablet Take 0.5 tablets by mouth daily.    . metoprolol tartrate (LOPRESSOR) 25 MG tablet take 1/2 tablet by mouth twice a day 30 tablet 5  . Multiple Vitamins-Minerals (CENTRUM SILVER PO) Take 1 capsule by mouth daily.     . Multiple Vitamins-Minerals (PRESERVISION/LUTEIN PO) Take 1 capsule by mouth daily.     . potassium chloride SA (K-DUR,KLOR-CON) 20 MEQ tablet Take 1 tablet (20 mEq total) by mouth daily. 90 tablet 3  . propranolol (INDERAL) 10 MG tablet take 1 tablet by mouth if needed .MAY take 1 tablet by mouth three to four times a day . CALL PHYSICIAN WITH SYMPTOMS. 30 tablet 5  .  rivaroxaban (XARELTO) 20 MG TABS tablet Take 1 tablet (20 mg total) by mouth daily. 30 tablet 1   No current facility-administered medications for this encounter.    Allergies  Allergen Reactions  . Shellfish Allergy Hives and Swelling  . Atropine     Causes AFIB  . Ceclor [Cefaclor] Hives  . Codeine     Leg pain  . Contrast Media [Iodinated Diagnostic Agents] Hives  . Doxycycline     unknown  . Fenoprofen     unknown  . Fenoprofen Calcium Swelling  . Garlic     Other reaction(s): Bleeding (intolerance)  . Isopto Hyoscine [Scopolamine]     Causes AFIB  . Levaquin [Levofloxacin]     Rash & causes AFIB  . Other     Other reaction(s): Bleeding (intolerance) Nutrasweet Naphon - swelling  . Oxycodone      Depression  . Penicillins     Has patient had a PCN reaction causing immediate rash, facial/tongue/throat swelling, SOB or lightheadedness with hypotension: unknown Has patient had a PCN reaction causing severe rash involving mucus membranes or skin necrosis: unknown Has patient had a PCN reaction that required hospitalization unknown Has patient had a PCN reaction occurring within the last 10 years:no If all of the above answers are "NO", then may proceed with Cephalosporin use. unknown  . Sulfa Drugs Cross Reactors     unknown  . Warfarin     Other reaction(s): Bleeding (intolerance) From eyes  . Warfarin Sodium     Bleeding  . Tape Rash    Use paper tape    Social History   Social History  . Marital Status: Married    Spouse Name: N/A  . Number of Children: 3  . Years of Education: N/A   Occupational History  .     Social History Main Topics  . Smoking status: Never Smoker   . Smokeless tobacco: Not on file  . Alcohol Use: No  . Drug Use: No  . Sexual Activity: Yes    Birth Control/ Protection:  Post-menopausal     Comment: 1st intercourse 39 yo-1 partner   Other Topics Concern  . 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    Family History  Problem Relation Age of Onset  . Hypertension Father   . Heart attack Father   . Parkinsonism Father   . Diabetes Brother   . Heart disease Brother   . Lung cancer Brother   . Stroke Mother     ROS- All systems are reviewed and negative except as per the HPI above  Physical Exam: Filed Vitals:   02/15/15 1503  BP: 102/72  Pulse: 78  Height: 5\' 4"  (1.626  m)  Weight: 192 lb 3.2 oz (87.181 kg)    GEN- The patient is well appearing, alert and oriented x 3 today.  Head- normocephalic, atraumatic Eyes- Sclera clear, conjunctiva pink Ears- hearing intact Oropharynx- clear Neck- supple, no JVP Lymph- no cervical lymphadenopathy Lungs- Clear to ausculation bilaterally, normal work of breathing Heart- Irregular rate and rhythm, no murmurs, rubs or gallops, PMI not laterally displaced GI- soft, NT, ND, + BS Extremities- no clubbing, cyanosis, or edema MS- no significant deformity or atrophy Skin- no rash or lesion Psych- euthymic mood, full affect Neuro- strength and sensation are intact  EKG- afib with v rate 68 bp,. qrs int 104 ms, qtc 497 ms. Epic records reviewed.  Assessment and Plan: 1. Persistent afib On flecainide 100 mg bid with failure for chemical conversion Will schedule for cardioversion No missed doses of xarelto  Bmet/cbc  F/u one week s/p cardioversion  Butch Penny C. [facility] [address] [place], [place] [address] [phone]        For [place]; patient in EAT; compliant with xarelto. Kirk Ruths

## 2015-02-22 NOTE — Discharge Instructions (Signed)
Electrical Cardioversion, Care After °Refer to this sheet in the next few weeks. These instructions provide you with information on caring for yourself after your procedure. Your health care provider may also give you more specific instructions. Your treatment has been planned according to current medical practices, but problems sometimes occur. Call your health care provider if you have any problems or questions after your procedure. °WHAT TO EXPECT AFTER THE PROCEDURE °After your procedure, it is typical to have the following sensations: °· Some redness on the skin where the shocks were delivered. If this is tender, a sunburn lotion or hydrocortisone cream may help. °· Possible return of an abnormal heart rhythm within hours or days after the procedure. °HOME CARE INSTRUCTIONS °· Take medicines only as directed by your health care provider. Be sure you understand how and when to take your medicine. °· Learn how to feel your pulse and check it often. °· Limit your activity for 48 hours after the procedure or as directed by your health care provider. °· Avoid or minimize caffeine and other stimulants as directed by your health care provider. °SEEK MEDICAL CARE IF: °· You feel like your heart is beating too fast or your pulse is not regular. °· You have any questions about your medicines. °· You have bleeding that will not stop. °SEEK IMMEDIATE MEDICAL CARE IF: °· You are dizzy or feel faint. °· It is hard to breathe or you feel short of breath. °· There is a change in discomfort in your chest. °· Your speech is slurred or you have trouble moving an arm or leg on one side of your body. °· You get a serious muscle cramp that does not go away. °· Your fingers or toes turn cold or blue. °  °This information is not intended to replace advice given to you by your health care provider. Make sure you discuss any questions you have with your health care provider. °  °Document Released: 11/17/2012 Document Revised: 02/17/2014  Document Reviewed: 11/17/2012 °Elsevier Interactive Patient Education ©2016 [employer] ° °

## 2015-02-22 NOTE — Procedures (Addendum)
Electrical Cardioversion Procedure Note Megan Hester [id number] 20-Nov-1930  Procedure: Electrical Cardioversion Indications:  Ectopic atrial tachycardia  Procedure Details Consent: Risks of procedure as well as the alternatives and risks of each were explained to the (patient/caregiver).  Consent for procedure obtained. Time Out: Verified patient identification, verified procedure, site/side was marked, verified correct patient position, special equipment/implants available, medications/allergies/relevent history reviewed, required imaging and test results available.  Performed  Patient placed on cardiac monitor, pulse oximetry, supplemental oxygen as necessary.  Sedation given: Patient sedated by anesthesia with lidocaine 60 mg and diprovan 70 mg IV. Pacer pads placed anterior and posterior chest.  Cardioverted 1 time(s).  Cardioverted at 120J.  Evaluation Findings: Patient with sinus bradycardia and occasional junctional escape beats; continue cardizem; DC metoprolol and decrease cardizem to 240 mg daily. Complications: None Patient did tolerate procedure well.   Kirk Ruths 02/22/2015, 11:31 AM

## 2015-02-22 NOTE — Transfer of Care (Signed)
Immediate Anesthesia Transfer of Care Note  Patient: Megan Hester  Procedure(s) Performed: Procedure(s): CARDIOVERSION (N/A)  Patient Location: PACU  Anesthesia Type:MAC  Level of Consciousness: awake  Airway & Oxygen Therapy: Patient Spontanous Breathing and Patient connected to nasal cannula oxygen  Post-op Assessment: Report given to RN and Post -op Vital signs reviewed and stable  Post vital signs: stable  Last Vitals:  Filed Vitals:   02/22/15 1131  BP: 146/80  Pulse: 67  Resp: 15    Complications: No apparent anesthesia complications

## 2015-02-22 NOTE — Anesthesia Postprocedure Evaluation (Signed)
Anesthesia Post Note  Patient: Megan Hester  Procedure(s) Performed: Procedure(s) (LRB): CARDIOVERSION (N/A)  Patient location during evaluation: PACU Anesthesia Type: General Level of consciousness: awake and alert Pain management: pain level controlled Vital Signs Assessment: post-procedure vital signs reviewed and stable Respiratory status: spontaneous breathing Cardiovascular status: blood pressure returned to baseline Anesthetic complications: no    Last Vitals:  Filed Vitals:   02/22/15 1220 02/22/15 1232  BP: 86/44 120/74  Pulse: 26 69  Temp:  36.6 C  Resp: 20 22    Last Pain: There were no vitals filed for this visit.               Tiajuana Amass

## 2015-02-22 NOTE — Anesthesia Preprocedure Evaluation (Signed)
Anesthesia Evaluation  Patient identified by MRN, date of birth, ID band Patient awake    Reviewed: Allergy & Precautions, NPO status , Patient's Chart, lab work & pertinent test results  Airway Mallampati: II   Neck ROM: Full    Dental   Pulmonary neg pulmonary ROS,    breath sounds clear to auscultation       Cardiovascular hypertension, Pt. on medications + dysrhythmias Atrial Fibrillation  Rhythm:Regular Rate:Normal     Neuro/Psych negative neurological ROS     GI/Hepatic negative GI ROS, Neg liver ROS,   Endo/Other  negative endocrine ROS  Renal/GU negative Renal ROS     Musculoskeletal  (+) Arthritis ,   Abdominal   Peds  Hematology negative hematology ROS (+)   Anesthesia Other Findings   Reproductive/Obstetrics                             Anesthesia Physical Anesthesia Plan  ASA: III  Anesthesia Plan: General   Post-op Pain Management:    Induction: Intravenous  Airway Management Planned: Mask  Additional Equipment:   Intra-op Plan:   Post-operative Plan:   Informed Consent: I have reviewed the patients History and Physical, chart, labs and discussed the procedure including the risks, benefits and alternatives for the proposed anesthesia with the patient or authorized representative who has indicated his/her understanding and acceptance.     Plan Discussed with: CRNA  Anesthesia Plan Comments:         Anesthesia Quick Evaluation

## 2015-02-23 ENCOUNTER — Encounter (HOSPITAL_COMMUNITY): Payer: Self-pay | Admitting: Cardiology

## 2015-03-01 ENCOUNTER — Ambulatory Visit (HOSPITAL_COMMUNITY)
Admission: RE | Admit: 2015-03-01 | Discharge: 2015-03-01 | Disposition: A | Discharge status: Home / Self Care | Payer: Medicare Other | Source: Ambulatory Visit | Attending: Nurse Practitioner | Admitting: Nurse Practitioner

## 2015-03-01 ENCOUNTER — Encounter (HOSPITAL_COMMUNITY): Payer: Self-pay | Admitting: Nurse Practitioner

## 2015-03-01 VITALS — BP 128/68 | HR 78 | Ht 64.0 in | Wt 188.6 lb

## 2015-03-01 DIAGNOSIS — Z87898 Personal history of other specified conditions: Secondary | ICD-10-CM | POA: Insufficient documentation

## 2015-03-01 DIAGNOSIS — Z833 Family history of diabetes mellitus: Secondary | ICD-10-CM | POA: Diagnosis not present

## 2015-03-01 DIAGNOSIS — Z79899 Other long term (current) drug therapy: Secondary | ICD-10-CM | POA: Insufficient documentation

## 2015-03-01 DIAGNOSIS — Z888 Allergy status to other drugs, medicaments and biological substances status: Secondary | ICD-10-CM | POA: Insufficient documentation

## 2015-03-01 DIAGNOSIS — Z8249 Family history of ischemic heart disease and other diseases of the circulatory system: Secondary | ICD-10-CM | POA: Diagnosis not present

## 2015-03-01 DIAGNOSIS — Z823 Family history of stroke: Secondary | ICD-10-CM | POA: Diagnosis not present

## 2015-03-01 DIAGNOSIS — I481 Persistent atrial fibrillation: Secondary | ICD-10-CM | POA: Diagnosis present

## 2015-03-01 DIAGNOSIS — I4819 Other persistent atrial fibrillation: Secondary | ICD-10-CM

## 2015-03-01 DIAGNOSIS — Z882 Allergy status to sulfonamides status: Secondary | ICD-10-CM | POA: Insufficient documentation

## 2015-03-01 DIAGNOSIS — Z7902 Long term (current) use of antithrombotics/antiplatelets: Secondary | ICD-10-CM | POA: Diagnosis not present

## 2015-03-01 DIAGNOSIS — Z91041 Radiographic dye allergy status: Secondary | ICD-10-CM | POA: Insufficient documentation

## 2015-03-01 DIAGNOSIS — Z881 Allergy status to other antibiotic agents status: Secondary | ICD-10-CM | POA: Diagnosis not present

## 2015-03-01 DIAGNOSIS — Z880 Allergy status to penicillin: Secondary | ICD-10-CM | POA: Diagnosis not present

## 2015-03-01 DIAGNOSIS — Z885 Allergy status to narcotic agent status: Secondary | ICD-10-CM | POA: Insufficient documentation

## 2015-03-01 DIAGNOSIS — I10 Essential (primary) hypertension: Secondary | ICD-10-CM | POA: Diagnosis not present

## 2015-03-01 DIAGNOSIS — H3530 Unspecified macular degeneration: Secondary | ICD-10-CM | POA: Insufficient documentation

## 2015-03-01 NOTE — Progress Notes (Signed)
Patient ID: Megan Hester, female   DOB: 1930/09/10, 82 y.o.   MRN: [id number]      Primary Care Physician: Geoffery Lyons, MD Referring Physician: Dr. Gean Birchwood is a 82 y.o. female with a h/o persistent afib that is back today for f/u after one cardioversion with ERAF and then loading on flecainide. Unfortunately, she remains in afib but is rate controlled. She is tolerating afib. Being complaint with xarelto. She was scheduled for repeat cardioversion.  She returns today with successful cardioversion and staying in SR. She has more energy and feels encouraged to be in rhythm. Continues on blood thinner.  Today, she denies symptoms of palpitations, chest pain, shortness of breath, orthopnea, PND, lower extremity edema, dizziness, presyncope, syncope, or neurologic sequela. The patient is tolerating medications without difficulties and is otherwise without complaint today.   Past Medical History  Diagnosis Date  . Macular degeneration   . Hypertension   . Paroxysmal atrial fibrillation (HCC)   . 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 Arthritis   . Cancer [facility]) 1999    Breast cancer--DCIS   Past Surgical History  Procedure Laterality Date  . Breast lumpectomy  1999    RADIATION  . Cardiac catheterization  02/05/1999    HYPERDYNAMIC LEFT VENTRICLE WITH EF OF 75-80%  . 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 Right eye removed    . Cardioversion N/A 01/22/2015    Procedure: CARDIOVERSION;  Surgeon: [name], MD;  Location: [facility];  Service: Cardiovascular;  Laterality: N/A;  . Cardioversion N/A 02/22/2015    Procedure: CARDIOVERSION;  Surgeon: [name], MD;  Location: [facility] ENDOSCOPY;  Service: Cardiovascular;  Laterality: N/A;    Current Outpatient Prescriptions  Medication Sig Dispense Refill  . Cholecalciferol (VITAMIN D) 1000 UNITS capsule Take 1,000 Units by mouth daily.      . clobetasol cream (TEMOVATE) 0.05 % USE  NIGHTLY FOR 1 MONTH (Patient taking differently: Take 1 application as needed for irritation and itching) 60 g 0  . diltiazem (CARDIZEM CD) 240 MG 24 hr capsule Take 1 capsule (240 mg total) by mouth every morning. 180 capsule 3  . docusate sodium (COLACE) 100 MG capsule Take 300 mg by mouth daily.    . fexofenadine (ALLEGRA) 180 MG tablet Take 180 mg by mouth daily.    . flecainide (TAMBOCOR) 50 MG tablet Take 1 tablet (50 mg total) by mouth 2 (two) times daily. (Patient taking differently: Take 100 mg by mouth 2 (two) times daily. ) 180 tablet 3  . fluticasone (FLONASE) 50 MCG/ACT nasal spray Place 1 spray into both nostrils daily.     . furosemide (LASIX) 40 MG tablet Take 1 tablet (40 mg total) by mouth daily. (Patient taking differently: Take 40 mg by mouth daily as needed for fluid or edema. ) 90 tablet 3  . lisinopril-hydrochlorothiazide (PRINZIDE,ZESTORETIC) 20-12.5 MG tablet Take 0.5 tablets by mouth daily.    . Multiple Vitamins-Minerals (CENTRUM SILVER PO) Take 1 capsule by mouth daily.     . Multiple Vitamins-Minerals (PRESERVISION/LUTEIN PO) Take 1 capsule by mouth daily.     . potassium chloride SA (K-DUR,KLOR-CON) 20 MEQ tablet Take 1 tablet (20 mEq total) by mouth daily. (Patient taking differently: Take 20 mEq by mouth daily as needed. ) 90 tablet 3  . rivaroxaban (XARELTO) 20 MG TABS tablet Take 1 tablet (20 mg total) by mouth daily. 30 tablet  1   No current facility-administered medications for this encounter.    Allergies  Allergen Reactions  . Shellfish Allergy Hives and Swelling  . Atropine     Causes AFIB  . Ceclor [Cefaclor] Hives  . Codeine     Leg pain  . Contrast Media [Iodinated Diagnostic Agents] Hives  . Doxycycline     unknown  . Fenoprofen     unknown  . Fenoprofen Calcium Swelling  . Garlic     Other reaction(s): Bleeding (intolerance)  . Isopto Hyoscine [Scopolamine]     Causes AFIB  . Levaquin [Levofloxacin]     Rash & causes AFIB  . Other      Other reaction(s): Bleeding (intolerance) Nutrasweet Naphon - swelling  . Oxycodone      Depression  . Penicillins     Has patient had a PCN reaction causing immediate rash, facial/tongue/throat swelling, SOB or lightheadedness with hypotension: unknown Has patient had a PCN reaction causing severe rash involving mucus membranes or skin necrosis: unknown Has patient had a PCN reaction that required hospitalization unknown Has patient had a PCN reaction occurring within the last 10 years:no If all of the above answers are "NO", then may proceed with Cephalosporin use. unknown  . Sulfa Drugs Cross Reactors     unknown  . Warfarin     Other reaction(s): Bleeding (intolerance) From eyes  . Warfarin Sodium     Bleeding  . Tape Rash    Use paper tape    Social History   Social History  . Marital Status: Married    Spouse Name: N/A  . Number of Children: 3  . Years of Education: N/A   Occupational History  .     Social History Main Topics  . Smoking status: Never Smoker   . Smokeless tobacco: Not on file  . Alcohol Use: No  . Drug Use: No  . Sexual Activity: Yes    Birth Control/ Protection: Post-menopausal     Comment: 1st intercourse 15 yo-1 partner   Other Topics Concern  . 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    Family History  Problem Relation Age of Onset  . Hypertension Father   . Heart attack Father   . Parkinsonism Father   . Diabetes Brother   . Heart disease Brother   . Lung cancer Brother   . Stroke Mother     ROS- All systems are reviewed and negative except as per the HPI above  Physical Exam: Filed Vitals:   03/01/15 1355  BP: 128/68  Pulse: 78  Height: 5\' 4"  (1.626 m)  Weight: 188 lb 9.6 oz (85.548 kg)    GEN- The patient is well appearing, alert and oriented x 3 today.   Head- normocephalic, atraumatic Eyes-  Sclera clear, conjunctiva pink Ears- hearing intact Oropharynx-  clear Neck- supple, no JVP Lymph- no cervical lymphadenopathy Lungs- Clear to ausculation bilaterally, normal work of breathing Heart- Irregular rate and rhythm, no murmurs, rubs or gallops, PMI not laterally displaced GI- soft, NT, ND, + BS Extremities- no clubbing, cyanosis, or edema MS- no significant deformity or atrophy Skin- no rash or lesion Psych- euthymic mood, full affect Neuro- strength and sensation are intact  EKG- Sinus rhythm with first degree AV block pr int 242 ms, qrs int 76 ms, qtc 487 ms.( will review with Dr. Rayann Heman to see if flecainide dose needs to be reduced with presence of first  degree heart block which appears new) Epic records reviewed.  Assessment and Plan: 1. Persistent afib S/ p successful cardioversion  Continue flecainide 100 mg bid   Continue xarelto    F/u one month  Butch Penny C. [place], [facility] [address] [place], [place] [address] [phone]

## 2015-03-15 ENCOUNTER — Other Ambulatory Visit (HOSPITAL_COMMUNITY): Payer: Self-pay | Admitting: *Deleted

## 2015-03-15 MED ORDER — FLECAINIDE ACETATE 100 MG PO TABS: 100.0000 mg | ORAL_TABLET | Freq: Two times a day (BID) | ORAL | Status: DC

## 2015-04-02 ENCOUNTER — Inpatient Hospital Stay (HOSPITAL_COMMUNITY): Admission: RE | Admit: 2015-04-02 | Payer: Medicare Other | Source: Ambulatory Visit | Admitting: Nurse Practitioner

## 2015-04-04 ENCOUNTER — Inpatient Hospital Stay (HOSPITAL_COMMUNITY): Admission: RE | Admit: 2015-04-04 | Payer: Medicare Other | Source: Ambulatory Visit | Admitting: Nurse Practitioner

## 2015-04-10 ENCOUNTER — Encounter (HOSPITAL_COMMUNITY): Payer: Self-pay | Admitting: Nurse Practitioner

## 2015-04-10 ENCOUNTER — Ambulatory Visit (HOSPITAL_COMMUNITY)
Admission: RE | Admit: 2015-04-10 | Discharge: 2015-04-10 | Disposition: A | Discharge status: Home / Self Care | Payer: Medicare Other | Source: Ambulatory Visit | Attending: Nurse Practitioner | Admitting: Nurse Practitioner

## 2015-04-10 VITALS — BP 112/62 | HR 67 | Ht 64.0 in | Wt 186.4 lb

## 2015-04-10 DIAGNOSIS — Z823 Family history of stroke: Secondary | ICD-10-CM | POA: Diagnosis not present

## 2015-04-10 DIAGNOSIS — Z882 Allergy status to sulfonamides status: Secondary | ICD-10-CM | POA: Insufficient documentation

## 2015-04-10 DIAGNOSIS — Z91041 Radiographic dye allergy status: Secondary | ICD-10-CM | POA: Insufficient documentation

## 2015-04-10 DIAGNOSIS — H3530 Unspecified macular degeneration: Secondary | ICD-10-CM | POA: Diagnosis not present

## 2015-04-10 DIAGNOSIS — I10 Essential (primary) hypertension: Secondary | ICD-10-CM | POA: Insufficient documentation

## 2015-04-10 DIAGNOSIS — Z880 Allergy status to penicillin: Secondary | ICD-10-CM | POA: Insufficient documentation

## 2015-04-10 DIAGNOSIS — Z833 Family history of diabetes mellitus: Secondary | ICD-10-CM | POA: Diagnosis not present

## 2015-04-10 DIAGNOSIS — Z888 Allergy status to other drugs, medicaments and biological substances status: Secondary | ICD-10-CM | POA: Diagnosis not present

## 2015-04-10 DIAGNOSIS — Z885 Allergy status to narcotic agent status: Secondary | ICD-10-CM | POA: Diagnosis not present

## 2015-04-10 DIAGNOSIS — Z79899 Other long term (current) drug therapy: Secondary | ICD-10-CM | POA: Insufficient documentation

## 2015-04-10 DIAGNOSIS — I481 Persistent atrial fibrillation: Secondary | ICD-10-CM | POA: Insufficient documentation

## 2015-04-10 DIAGNOSIS — M1990 Unspecified osteoarthritis, unspecified site: Secondary | ICD-10-CM | POA: Insufficient documentation

## 2015-04-10 DIAGNOSIS — I4891 Unspecified atrial fibrillation: Secondary | ICD-10-CM | POA: Diagnosis present

## 2015-04-10 DIAGNOSIS — Z881 Allergy status to other antibiotic agents status: Secondary | ICD-10-CM | POA: Diagnosis not present

## 2015-04-10 DIAGNOSIS — Z7902 Long term (current) use of antithrombotics/antiplatelets: Secondary | ICD-10-CM | POA: Insufficient documentation

## 2015-04-10 DIAGNOSIS — Z8249 Family history of ischemic heart disease and other diseases of the circulatory system: Secondary | ICD-10-CM | POA: Diagnosis not present

## 2015-04-10 DIAGNOSIS — I4819 Other persistent atrial fibrillation: Secondary | ICD-10-CM

## 2015-04-10 NOTE — Progress Notes (Signed)
Patient ID: Megan Hester, female   DOB: 01/11/1931, 82 y.o.   MRN: [id number]     Primary Care Physician: Geoffery Lyons, MD Referring Physician: Dr. Gean Birchwood is a 82 y.o. female with a h/o persistent afib that is back today for f/u after one cardioversion with ERAF and then loading on flecainide. Unfortunately, she remains in afib but is rate controlled. She is tolerating afib. Being complaint with xarelto. She was scheduled for repeat cardioversion.  She returns today with successful cardioversion and staying in SR. She has more energy and feels encouraged to be in rhythm. Continues on blood thinner.  Returns 2/28 and is staying in SR  On flecainide and so appreciative because she feels so much better. Continues on xarelto. Fell twice last week due to macular degeneration and not being able to tell the beginning of her steps. Her daughter got a throw rug in a contrast color and she can differentiate the start/ending of the steps.  Today, she denies symptoms of palpitations, chest pain, shortness of breath, orthopnea, PND, lower extremity edema, dizziness, presyncope, syncope, or neurologic sequela. The patient is tolerating medications without difficulties and is otherwise without complaint today.   Past Medical History  Diagnosis Date  . Macular degeneration   . Hypertension   . Paroxysmal atrial fibrillation (HCC)   . 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 Arthritis   . Cancer [facility]) 1999    Breast cancer--DCIS   Past Surgical History  Procedure Laterality Date  . Breast lumpectomy  1999    RADIATION  . Cardiac catheterization  02/05/1999    HYPERDYNAMIC LEFT VENTRICLE WITH EF OF 75-80%  . 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 Right eye removed    . Cardioversion N/A 01/22/2015    Procedure: CARDIOVERSION;  Surgeon: [name], MD;  Location: [facility];  Service: Cardiovascular;  Laterality: N/A;  . Cardioversion N/A 02/22/2015      Procedure: CARDIOVERSION;  Surgeon: [name], MD;  Location: [facility] ENDOSCOPY;  Service: Cardiovascular;  Laterality: N/A;    Current Outpatient Prescriptions  Medication Sig Dispense Refill  . Cholecalciferol (VITAMIN D) 1000 UNITS capsule Take 1,000 Units by mouth daily.      . clobetasol cream (TEMOVATE) 0.05 % USE NIGHTLY FOR 1 MONTH (Patient taking differently: Take 1 application as needed for irritation and itching) 60 g 0  . diltiazem (CARDIZEM CD) 240 MG 24 hr capsule Take 1 capsule (240 mg total) by mouth every morning. 180 capsule 3  . docusate sodium (COLACE) 100 MG capsule Take 300 mg by mouth daily.    . fexofenadine (ALLEGRA) 180 MG tablet Take 180 mg by mouth daily.    . flecainide (TAMBOCOR) 100 MG tablet Take 1 tablet (100 mg total) by mouth 2 (two) times daily. 60 tablet 6  . fluticasone (FLONASE) 50 MCG/ACT nasal spray Place 1 spray into both nostrils daily.     . furosemide (LASIX) 40 MG tablet Take 1 tablet (40 mg total) by mouth daily. (Patient taking differently: Take 40 mg by mouth daily as needed for fluid or edema. ) 90 tablet 3  . lisinopril-hydrochlorothiazide (PRINZIDE,ZESTORETIC) 20-12.5 MG tablet Take 0.5 tablets by mouth daily.    . Multiple Vitamins-Minerals (CENTRUM SILVER PO) Take 1 capsule by mouth daily.     . Multiple Vitamins-Minerals (PRESERVISION/LUTEIN PO) Take 1 capsule by mouth daily.     . potassium chloride  SA (K-DUR,KLOR-CON) 20 MEQ tablet Take 1 tablet (20 mEq total) by mouth daily. (Patient taking differently: Take 20 mEq by mouth daily as needed. ) 90 tablet 3  . rivaroxaban (XARELTO) 20 MG TABS tablet Take 1 tablet (20 mg total) by mouth daily. 30 tablet 1   No current facility-administered medications for this encounter.    Allergies  Allergen Reactions  . Shellfish Allergy Hives and Swelling  . Atropine     Causes AFIB  . Ceclor [Cefaclor] Hives  . Codeine     Leg pain  . Contrast Media [Iodinated Diagnostic Agents] Hives  .  Doxycycline     unknown  . Fenoprofen     unknown  . Fenoprofen Calcium Swelling  . Garlic     Other reaction(s): Bleeding (intolerance)  . Isopto Hyoscine [Scopolamine]     Causes AFIB  . Levaquin [Levofloxacin]     Rash & causes AFIB  . Other     Other reaction(s): Bleeding (intolerance) Nutrasweet Naphon - swelling  . Oxycodone      Depression  . Penicillins     Has patient had a PCN reaction causing immediate rash, facial/tongue/throat swelling, SOB or lightheadedness with hypotension: unknown Has patient had a PCN reaction causing severe rash involving mucus membranes or skin necrosis: unknown Has patient had a PCN reaction that required hospitalization unknown Has patient had a PCN reaction occurring within the last 10 years:no If all of the above answers are "NO", then may proceed with Cephalosporin use. unknown  . Sulfa Drugs Cross Reactors     unknown  . Warfarin     Other reaction(s): Bleeding (intolerance) From eyes  . Warfarin Sodium     Bleeding  . Tape Rash    Use paper tape    Social History   Social History  . Marital Status: Married    Spouse Name: N/A  . Number of Children: 3  . Years of Education: N/A   Occupational History  .     Social History Main Topics  . Smoking status: Never Smoker   . Smokeless tobacco: Not on file  . Alcohol Use: No  . Drug Use: No  . Sexual Activity: Yes    Birth Control/ Protection: Post-menopausal     Comment: 1st intercourse 49 yo-1 partner   Other Topics Concern  . 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    Family History  Problem Relation Age of Onset  . Hypertension Father   . Heart attack Father   . Parkinsonism Father   . Diabetes Brother   . Heart disease Brother   . Lung cancer Brother   . Stroke Mother     ROS- All systems are reviewed and negative except as per the HPI above  Physical Exam: Filed Vitals:   04/10/15 1358  BP: 112/62    Pulse: 67  Height: 5\' 4"  (1.626 m)  Weight: 186 lb 6.4 oz (84.55 kg)    GEN- The patient is well appearing, alert and oriented x 3 today.   Head- normocephalic, atraumatic Eyes-  Sclera clear, conjunctiva pink Ears- hearing intact Oropharynx- clear Neck- supple, no JVP Lymph- no cervical lymphadenopathy Lungs- Clear to ausculation bilaterally, normal work of breathing Heart- Irregular rate and rhythm, no murmurs, rubs or gallops, PMI not laterally displaced GI- soft, NT, ND, + BS Extremities- no clubbing, cyanosis, or edema MS- no significant deformity or atrophy Skin- no rash or lesion  Psych- euthymic mood, full affect Neuro- strength and sensation are intact  EKG- SR with first degree AV block, NSIVB, pr int 224 ms, qrs int 136 ms, qtc 471 ms  Assessment and Plan: 1. Persistent afib S/ p successful cardioversion and staying in SR Continue flecainide 100 mg bid   Continue xarelto    F/u 3 months  Butch Penny C. Reagen Goates, [facility] [address] [place], [place] [address] [phone]

## 2015-07-16 ENCOUNTER — Telehealth: Payer: Self-pay | Admitting: Internal Medicine

## 2015-07-16 NOTE — Telephone Encounter (Signed)
New Message  Pt called states that she would normally receive samples of xarelto. She is asking if this is still and option she would like to pick them up on 07/17/2015  Patient calling the office for samples of medication:   1.  What medication and dosage are you requesting samples for? Xarelto  2.  Are you currently out of this medication? No, pt states that she has enough for 2 more days.

## 2015-07-17 ENCOUNTER — Encounter (HOSPITAL_COMMUNITY): Payer: Self-pay | Admitting: Nurse Practitioner

## 2015-07-17 ENCOUNTER — Ambulatory Visit (HOSPITAL_COMMUNITY)
Admission: RE | Admit: 2015-07-17 | Discharge: 2015-07-17 | Disposition: A | Discharge status: Home / Self Care | Payer: Medicare Other | Source: Ambulatory Visit | Attending: Nurse Practitioner | Admitting: Nurse Practitioner

## 2015-07-17 VITALS — BP 130/60 | HR 65 | Ht 64.0 in | Wt 180.0 lb

## 2015-07-17 DIAGNOSIS — Z882 Allergy status to sulfonamides status: Secondary | ICD-10-CM | POA: Diagnosis not present

## 2015-07-17 DIAGNOSIS — I10 Essential (primary) hypertension: Secondary | ICD-10-CM | POA: Diagnosis not present

## 2015-07-17 DIAGNOSIS — Z923 Personal history of irradiation: Secondary | ICD-10-CM | POA: Diagnosis not present

## 2015-07-17 DIAGNOSIS — Z885 Allergy status to narcotic agent status: Secondary | ICD-10-CM | POA: Insufficient documentation

## 2015-07-17 DIAGNOSIS — Z880 Allergy status to penicillin: Secondary | ICD-10-CM | POA: Diagnosis not present

## 2015-07-17 DIAGNOSIS — Z881 Allergy status to other antibiotic agents status: Secondary | ICD-10-CM | POA: Diagnosis not present

## 2015-07-17 DIAGNOSIS — I4891 Unspecified atrial fibrillation: Secondary | ICD-10-CM | POA: Diagnosis present

## 2015-07-17 DIAGNOSIS — M1990 Unspecified osteoarthritis, unspecified site: Secondary | ICD-10-CM | POA: Insufficient documentation

## 2015-07-17 DIAGNOSIS — Z7901 Long term (current) use of anticoagulants: Secondary | ICD-10-CM | POA: Insufficient documentation

## 2015-07-17 DIAGNOSIS — Z91041 Radiographic dye allergy status: Secondary | ICD-10-CM | POA: Diagnosis not present

## 2015-07-17 DIAGNOSIS — Z888 Allergy status to other drugs, medicaments and biological substances status: Secondary | ICD-10-CM | POA: Diagnosis not present

## 2015-07-17 DIAGNOSIS — Z91013 Allergy to seafood: Secondary | ICD-10-CM | POA: Diagnosis not present

## 2015-07-17 DIAGNOSIS — Z8249 Family history of ischemic heart disease and other diseases of the circulatory system: Secondary | ICD-10-CM | POA: Diagnosis not present

## 2015-07-17 DIAGNOSIS — H3530 Unspecified macular degeneration: Secondary | ICD-10-CM | POA: Insufficient documentation

## 2015-07-17 DIAGNOSIS — Z833 Family history of diabetes mellitus: Secondary | ICD-10-CM | POA: Diagnosis not present

## 2015-07-17 DIAGNOSIS — Z87898 Personal history of other specified conditions: Secondary | ICD-10-CM | POA: Diagnosis not present

## 2015-07-17 DIAGNOSIS — Z79899 Other long term (current) drug therapy: Secondary | ICD-10-CM | POA: Diagnosis not present

## 2015-07-17 DIAGNOSIS — I4819 Other persistent atrial fibrillation: Secondary | ICD-10-CM

## 2015-07-17 DIAGNOSIS — I481 Persistent atrial fibrillation: Secondary | ICD-10-CM

## 2015-07-17 NOTE — Telephone Encounter (Signed)
Samples placed at the front desk for patient. Called number listed for patient and her son answered.

## 2015-07-17 NOTE — Progress Notes (Signed)
Patient ID: Megan Hester, female   DOB: [birth year], 82 y.o.   MRN: [id number]      Primary Care Physician: Megan Lyons, MD Referring Physician: Dr. Gean Hester is a 82 y.o. female that is in the afib clinic for f/u with h/o of persistent afib but has been staying in SR with flecainide 100 mg bid. She currently has bronchitis but is improving, on prednisone taper. On xarelto with chadsvasc score of at least 4, no issues with bleeding. Renal function normal last checked by me in 1/17 and has physical with PCP tihis week with anticipated lab work.  Today, she denies symptoms of palpitations, chest pain, shortness of breath, orthopnea, PND, lower extremity edema, dizziness, presyncope, syncope, or neurologic sequela. The patient is tolerating medications without difficulties and is otherwise without complaint today.   Past Medical History  Diagnosis Date  . Macular degeneration   . Hypertension   . Paroxysmal atrial fibrillation (HCC)   . 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 Arthritis   . Cancer [facility]) 1999    Breast cancer--DCIS   Past Surgical History  Procedure Laterality Date  . Breast lumpectomy  1999    RADIATION  . Cardiac catheterization  02/05/1999    HYPERDYNAMIC LEFT VENTRICLE WITH EF OF 75-80%  . 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 Right eye removed    . Cardioversion N/A 01/22/2015    Procedure: CARDIOVERSION;  Surgeon: [name], MD;  Location: [facility];  Service: Cardiovascular;  Laterality: N/A;  . Cardioversion N/A 02/22/2015    Procedure: CARDIOVERSION;  Surgeon: [name], MD;  Location: [facility] ENDOSCOPY;  Service: Cardiovascular;  Laterality: N/A;    Current Outpatient Prescriptions  Medication Sig Dispense Refill  . Cholecalciferol (VITAMIN D) 1000 UNITS capsule Take 1,000 Units by mouth daily.      [name] Kitchen diltiazem (CARDIZEM CD) 240 MG 24 hr capsule Take 1 capsule (240 mg total) by mouth every morning. 180  capsule 3  . docusate sodium (COLACE) 100 MG capsule Take 300 mg by mouth daily.    . fexofenadine (ALLEGRA) 180 MG tablet Take 180 mg by mouth daily.    . flecainide (TAMBOCOR) 100 MG tablet Take 1 tablet (100 mg total) by mouth 2 (two) times daily. 60 tablet 6  . fluticasone (FLONASE) 50 MCG/ACT nasal spray Place 1 spray into both nostrils daily.     . furosemide (LASIX) 40 MG tablet Take 1 tablet (40 mg total) by mouth daily. (Patient taking differently: Take 40 mg by mouth daily as needed for fluid or edema. ) 90 tablet 3  . gabapentin (NEURONTIN) 100 MG capsule Take 100 mg by mouth daily.    [name] Kitchen lisinopril-hydrochlorothiazide (PRINZIDE,ZESTORETIC) 20-12.5 MG tablet Take 0.5 tablets by mouth daily.    . Multiple Vitamins-Minerals (CENTRUM SILVER PO) Take 1 capsule by mouth daily.     . Multiple Vitamins-Minerals (PRESERVISION/LUTEIN PO) Take 1 capsule by mouth daily.     . potassium chloride SA (K-DUR,KLOR-CON) 20 MEQ tablet Take 1 tablet (20 mEq total) by mouth daily. (Patient taking differently: Take 20 mEq by mouth daily as needed. ) 90 tablet 3  . predniSONE (STERAPRED UNI-PAK 21 TAB) 10 MG (21) TBPK tablet Take 10 mg by mouth daily.    . rivaroxaban (XARELTO) 20 MG TABS tablet Take 1 tablet (20 mg total) by mouth daily. 30 tablet 1  . clobetasol cream (TEMOVATE) 0.05 %  USE NIGHTLY FOR 1 MONTH (Patient taking differently: Take 1 application as needed for irritation and itching) 60 g 0   No current facility-administered medications for this encounter.    Allergies  Allergen Reactions  . Shellfish Allergy Hives and Swelling  . Atropine     Causes AFIB  . Ceclor [Cefaclor] Hives  . Codeine     Leg pain  . Contrast Media [Iodinated Diagnostic Agents] Hives  . Doxycycline     unknown  . Fenoprofen     unknown  . Fenoprofen Calcium Swelling  . Garlic     Other reaction(s): Bleeding (intolerance)  . Isopto Hyoscine [Scopolamine]     Causes AFIB  . Levaquin [Levofloxacin]     Rash  & causes AFIB  . Other     Other reaction(s): Bleeding (intolerance) Nutrasweet Naphon - swelling  . Oxycodone      Depression  . Penicillins     Has patient had a PCN reaction causing immediate rash, facial/tongue/throat swelling, SOB or lightheadedness with hypotension: unknown Has patient had a PCN reaction causing severe rash involving mucus membranes or skin necrosis: unknown Has patient had a PCN reaction that required hospitalization unknown Has patient had a PCN reaction occurring within the last 10 years:no If all of the above answers are "NO", then may proceed with Cephalosporin use. unknown  . Sulfa Drugs Cross Reactors     unknown  . Warfarin     Other reaction(s): Bleeding (intolerance) From eyes  . Warfarin Sodium     Bleeding  . Tape Rash    Use paper tape    Social History   Social History  . Marital Status: Married    Spouse Name: N/A  . Number of Children: 3  . Years of Education: N/A   Occupational History  .     Social History Main Topics  . Smoking status: Never Smoker   . Smokeless tobacco: Not on file  . Alcohol Use: No  . Drug Use: No  . Sexual Activity: Yes    Birth Control/ Protection: Post-menopausal     Comment: 1st intercourse 73 yo-1 partner   Other Topics Concern  . 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    Family History  Problem Relation Age of Onset  . Hypertension Father   . Heart attack Father   . Parkinsonism Father   . Diabetes Brother   . Heart disease Brother   . Lung cancer Brother   . Stroke Mother     ROS- All systems are reviewed and negative except as per the HPI above  Physical Exam: Filed Vitals:   07/17/15 1413  BP: 130/60  Pulse: 65  Height: 5\' 4"  (1.626 m)  Weight: 180 lb (81.647 kg)    GEN- The patient is well appearing, alert and oriented x 3 today.   Head- normocephalic, atraumatic Eyes-  Sclera clear, conjunctiva pink Ears- hearing  intact Oropharynx- clear Neck- supple, no JVP Lymph- no cervical lymphadenopathy Lungs- Clear to ausculation bilaterally, normal work of breathing Heart- regular rate and rhythm, no murmurs, rubs or gallops, PMI not laterally displaced GI- soft, NT, ND, + BS Extremities- no clubbing, cyanosis, or edema MS- no significant deformity or atrophy Skin- no rash or lesion Psych- euthymic mood, full affect Neuro- strength and sensation are intact  EKG- Sinus brady at  with first degree AV block,  Pr int 256 ms, qrs int 126 ms, qtc  455 ms Epic records reviewed   Assessment and Plan: 1. Persistent afib S/p successful cardioversion 1/17 and staying in SR Continue flecainide 100 mg bid   Continue xarelto    F/u 3 months afib clinic  Butch Penny C. Nasir Bright, [facility] [address] [place], [place] [address] [phone]

## 2015-09-14 ENCOUNTER — Encounter: Payer: Self-pay | Admitting: Gynecology

## 2015-09-14 ENCOUNTER — Ambulatory Visit (INDEPENDENT_AMBULATORY_CARE_PROVIDER_SITE_OTHER): Payer: Medicare Other | Admitting: Gynecology

## 2015-09-14 VITALS — BP 116/70 | Ht 64.0 in | Wt 175.0 lb

## 2015-09-14 DIAGNOSIS — N952 Postmenopausal atrophic vaginitis: Secondary | ICD-10-CM

## 2015-09-14 DIAGNOSIS — Z01419 Encounter for gynecological examination (general) (routine) without abnormal findings: Secondary | ICD-10-CM | POA: Diagnosis not present

## 2015-09-14 DIAGNOSIS — N816 Rectocele: Secondary | ICD-10-CM

## 2015-09-14 NOTE — Patient Instructions (Signed)
Follow up in one year for exam. Call if/when you need more cream

## 2015-09-14 NOTE — Progress Notes (Signed)
    Megan Hester August 31, 1930 [id number]        82 y.o.  G3P3003  for Breast and pelvic exam. Several issues noted below.  Past medical history,surgical history, problem list, medications, allergies, family history and social history were all reviewed and documented as reviewed in the EPIC chart.  ROS:  Performed with pertinent positives and negatives included in the history, assessment and plan.   Additional significant findings :  none   Exam: Caryn Bee assistant Vitals:   09/14/15 1416  BP: 116/70  Weight: 175 lb (79.4 kg)  Height: 5\' 4"  (1.626 m)   Body mass index is 30.04 kg/m.  General appearance:  Normal affect, orientation and appearance. Skin: Grossly normal HEENT: Without gross lesions.  No cervical or supraclavicular adenopathy. Thyroid normal.  Lungs:  Clear without wheezing, rales or rhonchi Cardiac: RR, without RMG Abdominal:  Soft, nontender, without masses, guarding, rebound, organomegaly. Small easily reducible umbilical hernia. Breasts:  Examined lying and sitting.  Left without masses, retractions, discharge or axillary adenopathy.  Right with old lumpectomy scar and radiation changes. No masses, discharge or adenopathy Pelvic:  Ext/BUS/Vagina with atrophic changes. Small classic sebaceous cyst mid right labia majora. Second-degree rectocele  Cervix flushes with upper vagina  Uterus difficult to palpate but no gross masses or tenderness  Adnexa without gross masses or tenderness    Anus and perineum normal   Rectovaginal normal sphincter tone without palpated masses or tenderness.    Assessment/Plan:  82 y.o. G59P3003 female for breast and pelvic exam.  1. Postmenopausal/atrophic genital changes. Doing well without significant hot flushes, night sweats, vaginal dryness. No vaginal bleeding.  Continue to monitor report any issues or vaginal bleeding. 2. History of chronic vulvitis. Uses Temovate 0.05% cream intermittently as needed. Has supply at home but  will call when she needs more. 3. Rectocele. Patient has second-degree rectocele. She notes some urine sprang instead of a straights dream when she urinates. I think this is from her rectocele bulging down when she is going to the bathroom and being hit by her urine stream. Reviewed options to include pessary up to and including surgery. Patient not interested in intervention. 4. Umbilical hernia. Easily reducible and stable. 5. Pap smear 2004. No Pap smear done today. No history of abnormal Pap smears. We both agree to stop screening per current screening guidelines. 6. DEXA 2012.  Being followed by Dr. Reynaldo Minium.   7. Colonoscopy 2011. Repeat at their recommended interval. 8. Mammography 12/2014. Continue with annual mammography when due. Status post right breast cancer with lumpectomy and radiation. Exam NED. 9. Health maintenance. No routine lab work done as patient reports is done elsewhere. Follow up in one year, sooner as needed.   Anastasio Auerbach MD, 2:35 PM 09/14/2015

## 2015-10-17 ENCOUNTER — Inpatient Hospital Stay (HOSPITAL_COMMUNITY): Admission: RE | Admit: 2015-10-17 | Payer: Medicare Other | Source: Ambulatory Visit | Admitting: Nurse Practitioner

## 2015-10-19 ENCOUNTER — Ambulatory Visit (HOSPITAL_COMMUNITY)
Admission: RE | Admit: 2015-10-19 | Discharge: 2015-10-19 | Disposition: A | Discharge status: Home / Self Care | Payer: Medicare Other | Source: Ambulatory Visit | Attending: Nurse Practitioner | Admitting: Nurse Practitioner

## 2015-10-19 ENCOUNTER — Encounter (HOSPITAL_COMMUNITY): Payer: Self-pay | Admitting: Nurse Practitioner

## 2015-10-19 VITALS — BP 110/60 | HR 62 | Ht 64.0 in | Wt 176.0 lb

## 2015-10-19 DIAGNOSIS — I481 Persistent atrial fibrillation: Secondary | ICD-10-CM | POA: Insufficient documentation

## 2015-10-19 DIAGNOSIS — I4819 Other persistent atrial fibrillation: Secondary | ICD-10-CM

## 2015-10-19 NOTE — Progress Notes (Signed)
Patient ID: Megan Hester, female   DOB: August 03, 1930, 82 y.o.   MRN: [id number]      Primary Care Physician: Geoffery Lyons, MD Referring Physician: Dr. Gean Birchwood is a 82 y.o. female that is in the afib clinic for f/u with h/o of persistent afib but has been staying in SR with flecainide 100 mg bid. She feels well. Will feel an occasional flutter. On Xarelto without bleeding issues. Continues on flecainide 100 mg bid.  Today, she denies symptoms of palpitations, chest pain, shortness of breath, orthopnea, PND, lower extremity edema, dizziness, presyncope, syncope, or neurologic sequela. The patient is tolerating medications without difficulties and is otherwise without complaint today.   Past Medical History:  Diagnosis Date  . Arthritis   . Cancer ([place]) 1999   Breast cancer--DCIS  . Hypertension   . Macular degeneration   . Paroxysmal atrial fibrillation (HCC)   . 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 BREAST LUMPECTOMY  1999   RADIATION  . CARDIAC CATHETERIZATION  02/05/1999   HYPERDYNAMIC LEFT VENTRICLE WITH EF OF 75-80%  . CARDIOVERSION N/A 01/22/2015   Procedure: CARDIOVERSION;  Surgeon: [name], MD;  Location: [facility] ENDOSCOPY;  Service: Cardiovascular;  Laterality: N/A;  . CARDIOVERSION N/A 02/22/2015   Procedure: CARDIOVERSION;  Surgeon: [name], MD;  Location: [facility] ENDOSCOPY;  Service: Cardiovascular;  Laterality: N/A;  . ENUCLEATION  2012   right eye  . Right eye removed      Current Outpatient Prescriptions  Medication Sig Dispense Refill  . Cholecalciferol (VITAMIN D) 1000 UNITS capsule Take 1,000 Units by mouth daily.      . clobetasol cream (TEMOVATE) 0.05 % USE NIGHTLY FOR 1 MONTH (Patient taking differently: Take 1 application as needed for irritation and itching) 60 g 0  . diltiazem (CARDIZEM CD) 240 MG 24 hr capsule Take 1 capsule (240 mg total) by mouth  every morning. 180 capsule 3  . docusate sodium (COLACE) 100 MG capsule Take 300 mg by mouth daily.    . fexofenadine (ALLEGRA) 180 MG tablet Take 180 mg by mouth daily.    . flecainide (TAMBOCOR) 100 MG tablet Take 1 tablet (100 mg total) by mouth 2 (two) times daily. 60 tablet 6  . fluticasone (FLONASE) 50 MCG/ACT nasal spray Place 1 spray into both nostrils daily.     [name] Kitchen lisinopril-hydrochlorothiazide (PRINZIDE,ZESTORETIC) 20-12.5 MG tablet Take 0.5 tablets by mouth daily.    . Multiple Vitamins-Minerals (CENTRUM SILVER PO) Take 1 capsule by mouth daily.     . Multiple Vitamins-Minerals (PRESERVISION/LUTEIN PO) Take 1 capsule by mouth daily.     . rivaroxaban (XARELTO) 20 MG TABS tablet Take 1 tablet (20 mg total) by mouth daily. 30 tablet 1   No current facility-administered medications for this encounter.     Allergies  Allergen Reactions  . Shellfish Allergy Hives and Swelling  . Atropine     Causes AFIB  . Ceclor [Cefaclor] Hives  . Codeine     Leg pain  . Contrast Media [Iodinated Diagnostic Agents] Hives  . Doxycycline     unknown  . Fenoprofen     unknown  . Fenoprofen Calcium Swelling  . Garlic     Other reaction(s): Bleeding (intolerance)  . Isopto Hyoscine [Scopolamine]     Causes AFIB  . Levaquin [Levofloxacin]     Rash & causes AFIB  . Other  Other reaction(s): Bleeding (intolerance) Nutrasweet Naphon - swelling  . Oxycodone      Depression  . Penicillins     Has patient had a PCN reaction causing immediate rash, facial/tongue/throat swelling, SOB or lightheadedness with hypotension: unknown Has patient had a PCN reaction causing severe rash involving mucus membranes or skin necrosis: unknown Has patient had a PCN reaction that required hospitalization unknown Has patient had a PCN reaction occurring within the last 10 years:no If all of the above answers are "NO", then may proceed with Cephalosporin use. unknown  . Sulfa Drugs Cross Reactors      unknown  . Warfarin     Other reaction(s): Bleeding (intolerance) From eyes  . Warfarin Sodium     Bleeding  . Tape Rash    Use paper tape    Social History   Social History  . Marital status: Married    Spouse name: N/A  . Number of children: 3  . Years of education: N/A   Occupational History  .  Retired   Social History Main Topics  . Smoking status: Never Smoker  . Smokeless tobacco: Never Used  . Alcohol use 0.0 oz/week     Comment: Rare  . Drug use: No  . Sexual activity: Yes    Birth control/ protection: Post-menopausal     Comment: 1st intercourse 3 yo-1 partner   Other Topics Concern  . 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    Family History  Problem Relation Age of Onset  . Hypertension Father   . Heart attack Father   . Parkinsonism Father   . Diabetes Brother   . Heart disease Brother   . Lung cancer Brother   . Stroke Mother     ROS- All systems are reviewed and negative except as per the HPI above  Physical Exam: Vitals:   10/19/15 1136  BP: 110/60  Pulse: 62  Weight: 176 lb (79.8 kg)  Height: 5\' 4"  (1.626 m)    GEN- The patient is well appearing, alert and oriented x 3 today.   Head- normocephalic, atraumatic Eyes-  Sclera clear, conjunctiva pink Ears- hearing intact Oropharynx- clear Neck- supple, no JVP Lymph- no cervical lymphadenopathy Lungs- Clear to ausculation bilaterally, normal work of breathing Heart- regular rate and rhythm, no murmurs, rubs or gallops, PMI not laterally displaced GI- soft, NT, ND, + BS Extremities- no clubbing, cyanosis, or edema MS- no significant deformity or atrophy Skin- no rash or lesion Psych- euthymic mood, full affect Neuro- strength and sensation are intact  EKG- Sinus rhythm at 62 bpm, first degree AV block,  Pr int 236 ms, qrs int 100 ms, qtc 483 ms Epic records reviewed   Assessment and Plan: 1. Persistent afib S/p successful  cardioversion 1/17 and staying in SR Continue flecainide 100 mg bid   Continue cardizem Continue xarelto    F/u 3 months afib clinic  Butch Penny C. Chancy Smigiel, [facility] [address] [place], [place] [address] [phone]

## 2015-11-26 ENCOUNTER — Other Ambulatory Visit (HOSPITAL_COMMUNITY): Payer: Self-pay | Admitting: Nurse Practitioner

## 2015-12-06 ENCOUNTER — Other Ambulatory Visit: Payer: Self-pay | Admitting: Internal Medicine

## 2015-12-06 DIAGNOSIS — Z1231 Encounter for screening mammogram for malignant neoplasm of breast: Secondary | ICD-10-CM

## 2015-12-24 ENCOUNTER — Other Ambulatory Visit (HOSPITAL_COMMUNITY): Payer: Self-pay | Admitting: *Deleted

## 2015-12-24 MED ORDER — FLECAINIDE ACETATE 100 MG PO TABS: 100.0000 mg | ORAL_TABLET | Freq: Two times a day (BID) | ORAL | 3 refills | Status: DC

## 2016-01-14 ENCOUNTER — Ambulatory Visit
Admission: RE | Admit: 2016-01-14 | Discharge: 2016-01-14 | Disposition: A | Discharge status: Home / Self Care | Payer: Medicare Other | Source: Ambulatory Visit | Attending: Internal Medicine | Admitting: Internal Medicine

## 2016-01-14 DIAGNOSIS — Z1231 Encounter for screening mammogram for malignant neoplasm of breast: Secondary | ICD-10-CM

## 2016-01-17 ENCOUNTER — Ambulatory Visit (HOSPITAL_COMMUNITY)
Admission: RE | Admit: 2016-01-17 | Discharge: 2016-01-17 | Disposition: A | Discharge status: Home / Self Care | Payer: Medicare Other | Source: Ambulatory Visit | Attending: Nurse Practitioner | Admitting: Nurse Practitioner

## 2016-01-17 ENCOUNTER — Encounter (HOSPITAL_COMMUNITY): Payer: Self-pay | Admitting: Nurse Practitioner

## 2016-01-17 VITALS — BP 120/64 | HR 57 | Ht 64.0 in | Wt 177.0 lb

## 2016-01-17 DIAGNOSIS — Z833 Family history of diabetes mellitus: Secondary | ICD-10-CM | POA: Insufficient documentation

## 2016-01-17 DIAGNOSIS — I480 Paroxysmal atrial fibrillation: Secondary | ICD-10-CM | POA: Insufficient documentation

## 2016-01-17 DIAGNOSIS — Z823 Family history of stroke: Secondary | ICD-10-CM | POA: Diagnosis not present

## 2016-01-17 DIAGNOSIS — Z880 Allergy status to penicillin: Secondary | ICD-10-CM | POA: Diagnosis not present

## 2016-01-17 DIAGNOSIS — I481 Persistent atrial fibrillation: Secondary | ICD-10-CM | POA: Diagnosis present

## 2016-01-17 DIAGNOSIS — Z79899 Other long term (current) drug therapy: Secondary | ICD-10-CM | POA: Insufficient documentation

## 2016-01-17 DIAGNOSIS — Z8249 Family history of ischemic heart disease and other diseases of the circulatory system: Secondary | ICD-10-CM | POA: Diagnosis not present

## 2016-01-17 DIAGNOSIS — Z7901 Long term (current) use of anticoagulants: Secondary | ICD-10-CM | POA: Insufficient documentation

## 2016-01-17 DIAGNOSIS — Z91013 Allergy to seafood: Secondary | ICD-10-CM | POA: Insufficient documentation

## 2016-01-17 DIAGNOSIS — H3530 Unspecified macular degeneration: Secondary | ICD-10-CM | POA: Insufficient documentation

## 2016-01-17 DIAGNOSIS — Z780 Asymptomatic menopausal state: Secondary | ICD-10-CM | POA: Diagnosis not present

## 2016-01-17 DIAGNOSIS — I10 Essential (primary) hypertension: Secondary | ICD-10-CM | POA: Insufficient documentation

## 2016-01-17 DIAGNOSIS — Z9889 Other specified postprocedural states: Secondary | ICD-10-CM | POA: Insufficient documentation

## 2016-01-17 DIAGNOSIS — Z853 Personal history of malignant neoplasm of breast: Secondary | ICD-10-CM | POA: Diagnosis not present

## 2016-01-17 DIAGNOSIS — Z801 Family history of malignant neoplasm of trachea, bronchus and lung: Secondary | ICD-10-CM | POA: Insufficient documentation

## 2016-01-17 DIAGNOSIS — R42 Dizziness and giddiness: Secondary | ICD-10-CM | POA: Insufficient documentation

## 2016-01-17 DIAGNOSIS — Z888 Allergy status to other drugs, medicaments and biological substances status: Secondary | ICD-10-CM | POA: Insufficient documentation

## 2016-01-17 MED ORDER — DILTIAZEM HCL ER COATED BEADS 120 MG PO CP24: 120.0000 mg | ORAL_CAPSULE | Freq: Every morning | ORAL | 1 refills | Status: DC

## 2016-01-17 NOTE — Progress Notes (Signed)
Patient ID: Megan Hester, female   DOB: 01/11/1931, 82 y.o.   MRN: [id number]      Primary Care Physician: Geoffery Lyons, MD Referring Physician: Dr. Gean Birchwood is a 82 y.o. female that is in the afib clinic for f/u with h/o of persistent afib but has been staying in SR with flecainide 100 mg bid. She feels well. Will feel an occasional flutter. On Xarelto without bleeding issues. Continues on flecainide 100 mg bid.  F/u 12/7 and is staying in SR but has noticed weak spells for about a week, feels woozy for a couple of seconds. HR 57 bpm, with first degree AV block but PR int is actually shorter than on previous visits. Has had no change in medical condition or meds. Is pleased that she has not had any afib..  Today, she denies symptoms of palpitations, chest pain, shortness of breath, orthopnea, PND, lower extremity edema, dizziness, presyncope, syncope, or neurologic sequela. The patient is tolerating medications without difficulties and is otherwise without complaint today.   Past Medical History:  Diagnosis Date  . Arthritis   . Cancer ([place]) 1999   Breast cancer--DCIS  . Hypertension   . Macular degeneration   . Paroxysmal atrial fibrillation (HCC)   . 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 BREAST LUMPECTOMY  1999   RADIATION  . CARDIAC CATHETERIZATION  02/05/1999   HYPERDYNAMIC LEFT VENTRICLE WITH EF OF 75-80%  . CARDIOVERSION N/A 01/22/2015   Procedure: CARDIOVERSION;  Surgeon: [name], MD;  Location: [facility] ENDOSCOPY;  Service: Cardiovascular;  Laterality: N/A;  . CARDIOVERSION N/A 02/22/2015   Procedure: CARDIOVERSION;  Surgeon: [name], MD;  Location: [facility] ENDOSCOPY;  Service: Cardiovascular;  Laterality: N/A;  . ENUCLEATION  2012   right eye  . Right eye removed      Current Outpatient Prescriptions  Medication Sig Dispense Refill  . Cholecalciferol (VITAMIN D)  1000 UNITS capsule Take 1,000 Units by mouth daily.      . clobetasol cream (TEMOVATE) 0.05 % USE NIGHTLY FOR 1 MONTH (Patient taking differently: Take 1 application as needed for irritation and itching) 60 g 0  . diltiazem (CARDIZEM CD) 120 MG 24 hr capsule Take 1 capsule (120 mg total) by mouth every morning. 30 capsule 1  . docusate sodium (COLACE) 100 MG capsule Take 300 mg by mouth daily.    . fexofenadine (ALLEGRA) 180 MG tablet Take 180 mg by mouth daily.    . flecainide (TAMBOCOR) 100 MG tablet Take 1 tablet (100 mg total) by mouth 2 (two) times daily. 180 tablet 3  . fluticasone (FLONASE) 50 MCG/ACT nasal spray Place 1 spray into both nostrils daily.     [name] Kitchen lisinopril-hydrochlorothiazide (PRINZIDE,ZESTORETIC) 20-12.5 MG tablet Take 0.5 tablets by mouth daily.    . Multiple Vitamins-Minerals (CENTRUM SILVER PO) Take 1 capsule by mouth daily.     . Multiple Vitamins-Minerals (PRESERVISION/LUTEIN PO) Take 1 capsule by mouth daily.     . rivaroxaban (XARELTO) 20 MG TABS tablet Take 1 tablet (20 mg total) by mouth daily. 30 tablet 1   No current facility-administered medications for this encounter.     Allergies  Allergen Reactions  . Shellfish Allergy Hives and Swelling  . Atropine     Causes AFIB  . Ceclor [Cefaclor] Hives  . Codeine     Leg pain  . Contrast Media [Iodinated Diagnostic Agents]  Hives  . Doxycycline     unknown  . Fenoprofen     unknown  . Fenoprofen Calcium Swelling  . Garlic     Other reaction(s): Bleeding (intolerance)  . Isopto Hyoscine [Scopolamine]     Causes AFIB  . Levaquin [Levofloxacin]     Rash & causes AFIB  . Other     Other reaction(s): Bleeding (intolerance) Nutrasweet Naphon - swelling  . Oxycodone      Depression  . Penicillins     Has patient had a PCN reaction causing immediate rash, facial/tongue/throat swelling, SOB or lightheadedness with hypotension: unknown Has patient had a PCN reaction causing severe rash involving mucus  membranes or skin necrosis: unknown Has patient had a PCN reaction that required hospitalization unknown Has patient had a PCN reaction occurring within the last 10 years:no If all of the above answers are "NO", then may proceed with Cephalosporin use. unknown  . Sulfa Drugs Cross Reactors     unknown  . Warfarin     Other reaction(s): Bleeding (intolerance) From eyes  . Warfarin Sodium     Bleeding  . Tape Rash    Use paper tape    Social History   Social History  . Marital status: Married    Spouse name: N/A  . Number of children: 3  . Years of education: N/A   Occupational History  .  Retired   Social History Main Topics  . Smoking status: Never Smoker  . Smokeless tobacco: Never Used  . Alcohol use 0.0 oz/week     Comment: Rare  . Drug use: No  . Sexual activity: Yes    Birth control/ protection: Post-menopausal     Comment: 1st intercourse 58 yo-1 partner   Other Topics Concern  . 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    Family History  Problem Relation Age of Onset  . Hypertension Father   . Heart attack Father   . Parkinsonism Father   . Diabetes Brother   . Heart disease Brother   . Lung cancer Brother   . Stroke Mother     ROS- All systems are reviewed and negative except as per the HPI above  Physical Exam: Vitals:   01/17/16 1403  BP: 120/64  Pulse: (!) 57  Weight: 177 lb (80.3 kg)  Height: 5\' 4"  (1.626 m)    GEN- The patient is well appearing, alert and oriented x 3 today.   Head- normocephalic, atraumatic Eyes-  Sclera clear, conjunctiva pink Ears- hearing intact Oropharynx- clear Neck- supple, no JVP Lymph- no cervical lymphadenopathy Lungs- Clear to ausculation bilaterally, normal work of breathing Heart- regular rate and rhythm, no murmurs, rubs or gallops, PMI not laterally displaced GI- soft, NT, ND, + BS Extremities- no clubbing, cyanosis, or edema MS- no significant  deformity or atrophy Skin- no rash or lesion Psych- euthymic mood, full affect Neuro- strength and sensation are intact  EKG- Sinus rhythm at 57 bpm, first degree AV block, with sinus arrhythmia,   Pr int 222 ms, qrs int 104 ms, qtc 453 ms Epic records reviewed   Assessment and Plan: 1. Persistent afib S/p successful cardioversion 02/27/15 and staying in SR Continue flecainide 100 mg bid   Continue cardizem but reduce to 120 mg a day for woozy spells Continue xarelto    F/u in afib clinic on Monday, if not better, consider reducing flecainide or consider 48 hour monitor  Butch Penny  Eulah Pont, [facility] [place] [address] [place], [place] [address] [phone]

## 2016-01-17 NOTE — Patient Instructions (Signed)
Your physician has recommended you make the following change in your medication:  1)Decrease cardizem to 120mg once a day  

## 2016-01-18 ENCOUNTER — Ambulatory Visit (HOSPITAL_COMMUNITY): Payer: Medicare Other | Admitting: Nurse Practitioner

## 2016-01-21 ENCOUNTER — Ambulatory Visit (HOSPITAL_COMMUNITY)
Admission: RE | Admit: 2016-01-21 | Discharge: 2016-01-21 | Disposition: A | Discharge status: Home / Self Care | Payer: Medicare Other | Source: Ambulatory Visit | Attending: Nurse Practitioner | Admitting: Nurse Practitioner

## 2016-01-21 DIAGNOSIS — I480 Paroxysmal atrial fibrillation: Secondary | ICD-10-CM | POA: Insufficient documentation

## 2016-01-21 NOTE — Progress Notes (Addendum)
Pt in for repeat EKG today.  EKG to be reviewed by Roderic Palau, NP   On flecainide and decreased Cardizem 120 mg to daily last week for woozy spells. Did well until yesterday and felt tired with some lightheaded spells. Will draw flecainide level and 48 hour monitor.   Ekg shows SR brady with  Wandering pacemaker  with PAC's vrs Wenckebach block, hard to determine.  F/u in two weeks after monitor, sooner if needed.

## 2016-01-22 LAB — FLECAINIDE LEVEL: Flecainide: 0.49 ug/mL (ref 0.20–1.00)

## 2016-01-29 ENCOUNTER — Ambulatory Visit (INDEPENDENT_AMBULATORY_CARE_PROVIDER_SITE_OTHER): Payer: Medicare Other

## 2016-01-29 DIAGNOSIS — I480 Paroxysmal atrial fibrillation: Secondary | ICD-10-CM | POA: Diagnosis not present

## 2016-02-12 ENCOUNTER — Ambulatory Visit (HOSPITAL_COMMUNITY)
Admission: RE | Admit: 2016-02-12 | Discharge: 2016-02-12 | Disposition: A | Discharge status: Home / Self Care | Payer: Medicare Other | Source: Ambulatory Visit | Attending: Nurse Practitioner | Admitting: Nurse Practitioner

## 2016-02-12 ENCOUNTER — Encounter (HOSPITAL_COMMUNITY): Payer: Self-pay | Admitting: Nurse Practitioner

## 2016-02-12 VITALS — BP 132/74 | HR 76 | Ht 64.0 in | Wt 177.0 lb

## 2016-02-12 DIAGNOSIS — I480 Paroxysmal atrial fibrillation: Secondary | ICD-10-CM | POA: Insufficient documentation

## 2016-02-12 DIAGNOSIS — Z823 Family history of stroke: Secondary | ICD-10-CM | POA: Diagnosis not present

## 2016-02-12 DIAGNOSIS — Z888 Allergy status to other drugs, medicaments and biological substances status: Secondary | ICD-10-CM | POA: Diagnosis not present

## 2016-02-12 DIAGNOSIS — I4891 Unspecified atrial fibrillation: Secondary | ICD-10-CM | POA: Diagnosis present

## 2016-02-12 DIAGNOSIS — Z8249 Family history of ischemic heart disease and other diseases of the circulatory system: Secondary | ICD-10-CM | POA: Insufficient documentation

## 2016-02-12 DIAGNOSIS — Z833 Family history of diabetes mellitus: Secondary | ICD-10-CM | POA: Diagnosis not present

## 2016-02-12 DIAGNOSIS — Z801 Family history of malignant neoplasm of trachea, bronchus and lung: Secondary | ICD-10-CM | POA: Diagnosis not present

## 2016-02-12 DIAGNOSIS — Z853 Personal history of malignant neoplasm of breast: Secondary | ICD-10-CM | POA: Insufficient documentation

## 2016-02-12 DIAGNOSIS — I10 Essential (primary) hypertension: Secondary | ICD-10-CM | POA: Diagnosis not present

## 2016-02-12 DIAGNOSIS — Z91013 Allergy to seafood: Secondary | ICD-10-CM | POA: Insufficient documentation

## 2016-02-12 DIAGNOSIS — R42 Dizziness and giddiness: Secondary | ICD-10-CM | POA: Insufficient documentation

## 2016-02-12 DIAGNOSIS — Z7901 Long term (current) use of anticoagulants: Secondary | ICD-10-CM | POA: Diagnosis not present

## 2016-02-12 DIAGNOSIS — Z79899 Other long term (current) drug therapy: Secondary | ICD-10-CM | POA: Diagnosis not present

## 2016-02-12 DIAGNOSIS — Z880 Allergy status to penicillin: Secondary | ICD-10-CM | POA: Diagnosis not present

## 2016-02-12 DIAGNOSIS — H3530 Unspecified macular degeneration: Secondary | ICD-10-CM | POA: Diagnosis not present

## 2016-02-12 DIAGNOSIS — Z9889 Other specified postprocedural states: Secondary | ICD-10-CM | POA: Diagnosis not present

## 2016-02-12 DIAGNOSIS — I481 Persistent atrial fibrillation: Secondary | ICD-10-CM | POA: Diagnosis not present

## 2016-02-12 NOTE — Progress Notes (Signed)
Laurena Bering Patient ID: Megan Hester, female   DOB: Sep 25, 1930, 83 y.o.   MRN: [id number]      Primary Care Physician: Geoffery Lyons, MD Referring Physician: Dr. Gean Birchwood is a 83 y.o. female that was seen  in the afib clinic for f/u with h/o of persistent afib after being started on flecainide 100 mg bid resulting in  SR . She feels well. Will feel an occasional flutter. On Xarelto without bleeding issues. Continues on flecainide 100 mg bid.  F/u 12/7 and is staying in SR but has noticed weak spells for about a week, feels woozy for a couple of seconds. HR 57 bpm, with first degree AV block but PR int is actually shorter than on previous visits. Has had no change in medical condition or meds. Is pleased that she has not had any afib.. 48 hour monitor placed and diltiazem dose was decreased.  F/u 02/12/16 for review after 48 hour monitor results being received. It does show SR with 3-7 sec pauses mostly at night. She states that the woozy spells were worse than usual when wearing the monitor and getting up at night to use the bathroom. Not as symptomatic during the day.  Today, she denies symptoms of palpitations, chest pain, shortness of breath, orthopnea, PND, lower extremity edema, dizziness, presyncope, syncope, or neurologic sequela. The patient is tolerating medications without difficulties and is otherwise without complaint today.   Past Medical History:  Diagnosis Date  . Arthritis   . Cancer ([place]) 1999   Breast cancer--DCIS  . Hypertension   . Macular degeneration   . Paroxysmal atrial fibrillation (HCC)   . 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 BREAST LUMPECTOMY  1999   RADIATION  . CARDIAC CATHETERIZATION  02/05/1999   HYPERDYNAMIC LEFT VENTRICLE WITH EF OF 75-80%  . CARDIOVERSION N/A 01/22/2015   Procedure: CARDIOVERSION;  Surgeon: [name], MD;  Location: [facility] ENDOSCOPY;   Service: Cardiovascular;  Laterality: N/A;  . CARDIOVERSION N/A 02/22/2015   Procedure: CARDIOVERSION;  Surgeon: [name], MD;  Location: [facility] ENDOSCOPY;  Service: Cardiovascular;  Laterality: N/A;  . ENUCLEATION  2012   right eye  . Right eye removed      Current Outpatient Prescriptions  Medication Sig Dispense Refill  . Cholecalciferol (VITAMIN D) 1000 UNITS capsule Take 1,000 Units by mouth daily.      . clobetasol cream (TEMOVATE) 0.05 % USE NIGHTLY FOR 1 MONTH (Patient taking differently: Take 1 application as needed for irritation and itching) 60 g 0  . diltiazem (CARDIZEM CD) 120 MG 24 hr capsule Take 1 capsule (120 mg total) by mouth every morning. 30 capsule 1  . docusate sodium (COLACE) 100 MG capsule Take 300 mg by mouth daily.    . fexofenadine (ALLEGRA) 180 MG tablet Take 180 mg by mouth daily.    . fluticasone (FLONASE) 50 MCG/ACT nasal spray Place 1 spray into both nostrils daily.     [name] Kitchen lisinopril-hydrochlorothiazide (PRINZIDE,ZESTORETIC) 20-12.5 MG tablet Take 0.5 tablets by mouth daily.    . Multiple Vitamins-Minerals (CENTRUM SILVER PO) Take 1 capsule by mouth daily.     . Multiple Vitamins-Minerals (PRESERVISION/LUTEIN PO) Take 1 capsule by mouth daily.     . rivaroxaban (XARELTO) 20 MG TABS tablet Take 1 tablet (20 mg total) by mouth daily. 30 tablet 1   No current facility-administered medications for this encounter.  Allergies  Allergen Reactions  . Shellfish Allergy Hives and Swelling  . Atropine     Causes AFIB  . Ceclor [Cefaclor] Hives  . Codeine     Leg pain  . Contrast Media [Iodinated Diagnostic Agents] Hives  . Doxycycline     unknown  . Fenoprofen     unknown  . Fenoprofen Calcium Swelling  . Garlic     Other reaction(s): Bleeding (intolerance)  . Isopto Hyoscine [Scopolamine]     Causes AFIB  . Levaquin [Levofloxacin]     Rash & causes AFIB  . Other     Other reaction(s): Bleeding (intolerance) Nutrasweet Naphon - swelling  .  Oxycodone      Depression  . Penicillins     Has patient had a PCN reaction causing immediate rash, facial/tongue/throat swelling, SOB or lightheadedness with hypotension: unknown Has patient had a PCN reaction causing severe rash involving mucus membranes or skin necrosis: unknown Has patient had a PCN reaction that required hospitalization unknown Has patient had a PCN reaction occurring within the last 10 years:no If all of the above answers are "NO", then may proceed with Cephalosporin use. unknown  . Sulfa Drugs Cross Reactors     unknown  . Warfarin     Other reaction(s): Bleeding (intolerance) From eyes  . Warfarin Sodium     Bleeding  . Tape Rash    Use paper tape    Social History   Social History  . Marital status: Married    Spouse name: N/A  . Number of children: 3  . Years of education: N/A   Occupational History  .  Retired   Social History Main Topics  . Smoking status: Never Smoker  . Smokeless tobacco: Never Used  . Alcohol use 0.0 oz/week     Comment: Rare  . Drug use: No  . Sexual activity: Yes    Birth control/ protection: Post-menopausal     Comment: 1st intercourse 20 yo-1 partner   Other Topics Concern  . 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    Family History  Problem Relation Age of Onset  . Hypertension Father   . Heart attack Father   . Parkinsonism Father   . Diabetes Brother   . Heart disease Brother   . Lung cancer Brother   . Stroke Mother     ROS- All systems are reviewed and negative except as per the HPI above  Physical Exam: Vitals:   02/12/16 1432  BP: 132/74  Pulse: 76  Weight: 177 lb (80.3 kg)  Height: 5\' 4"  (1.626 m)    GEN- The patient is well appearing, alert and oriented x 3 today.   Head- normocephalic, atraumatic Eyes-  Sclera clear, conjunctiva pink Ears- hearing intact Oropharynx- clear Neck- supple, no JVP Lymph- no cervical  lymphadenopathy Lungs- Clear to ausculation bilaterally, normal work of breathing Heart- irregular rate and rhythm, no murmurs, rubs or gallops, PMI not laterally displaced GI- soft, NT, ND, + BS Extremities- no clubbing, cyanosis, or edema MS- no significant deformity or atrophy Skin- no rash or lesion Psych- euthymic mood, full affect Neuro- strength and sensation are intact  EKG- afib at 76 bpm, qrs int 106 ms, qtc 490 ms 48 hour monitor reviewed that showed 3-7 sec pauses, some intermittent afib Epic records reviewed   Assessment and Plan: 1. Persistent afib Lamonte Sakai been mostly in SR with flecainide, but now with episodes of lightheadedness  and monitor showing multiple 3-7 sec pauses Stop  flecainide  Continue cardizem at 120 mg a day   Continue xarelto    F/u with Dr. Rayann Heman on Monday 1/8, sooner here if needed  Butch Penny C. Mylik Pro, [facility] [address] [place], [place] [address] [phone]

## 2016-02-12 NOTE — Patient Instructions (Signed)
Your physician has recommended you make the following change in your medication:  1) Stop flecainide

## 2016-02-14 ENCOUNTER — Other Ambulatory Visit (HOSPITAL_COMMUNITY): Payer: Self-pay | Admitting: *Deleted

## 2016-02-14 MED ORDER — DILTIAZEM HCL 30 MG PO TABS: ORAL_TABLET | ORAL | 0 refills | Status: DC

## 2016-02-14 NOTE — Telephone Encounter (Signed)
Pt taken off flecainide due to pauses on monitor.  Pt having increased episodes of afib since d/c of flecainide.  Cardizem 30 mg prn tablet sent into pharmacy.  Directions explained to pt by Roderic Palau, NP.  Pt understood instructions.

## 2016-02-15 ENCOUNTER — Telehealth (HOSPITAL_COMMUNITY): Payer: Self-pay | Admitting: *Deleted

## 2016-02-15 NOTE — Telephone Encounter (Signed)
Patient called in stating her HR has started to become elevated since stopping flecainide. HR has been trending between 90-110s. Just feels uneasy today. Discussed with Roderic Palau NP will not increase daily rate control but calling in cardizem 30mg  PRN for elevated HR since the history of bradycardia.  Pt sees Dr. Rayann Heman on Monday for follow up.

## 2016-02-18 ENCOUNTER — Encounter: Payer: Self-pay | Admitting: Internal Medicine

## 2016-02-18 ENCOUNTER — Ambulatory Visit (INDEPENDENT_AMBULATORY_CARE_PROVIDER_SITE_OTHER): Payer: Medicare Other | Admitting: Internal Medicine

## 2016-02-18 VITALS — BP 114/68 | HR 72 | Ht 64.0 in | Wt 172.6 lb

## 2016-02-18 DIAGNOSIS — R42 Dizziness and giddiness: Secondary | ICD-10-CM | POA: Diagnosis not present

## 2016-02-18 DIAGNOSIS — I4819 Other persistent atrial fibrillation: Secondary | ICD-10-CM

## 2016-02-18 DIAGNOSIS — I495 Sick sinus syndrome: Secondary | ICD-10-CM | POA: Diagnosis not present

## 2016-02-18 DIAGNOSIS — I481 Persistent atrial fibrillation: Secondary | ICD-10-CM

## 2016-02-18 NOTE — Progress Notes (Signed)
PCP: Geoffery Lyons, MD  Megan Hester is a 83 y.o. female with a h/o atrial fibrillation who presents today for follow-up.  She has begun having episodes of dizziness and presyncope over the past month.  She was evaluated by Butch Penny in the AF clinic and found to have sick sinus syndrome with pauses of up to 7 seconds on holter monitor.  She also has afib with elevated V rates for which she requires cardizem.  Today, she denies symptoms of orthopnea, PND, lower extremity edema,  syncope, or neurologic sequela.  She is tolerating xarelto without significant bleeding.  Past Medical History:  Diagnosis Date  . Arthritis   . Cancer ([place]) 1999   Breast cancer--DCIS  . Hypertension   . Macular degeneration   . Paroxysmal atrial fibrillation (HCC)   . 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 BREAST LUMPECTOMY  1999   RADIATION  . CARDIAC CATHETERIZATION  02/05/1999   HYPERDYNAMIC LEFT VENTRICLE WITH EF OF 75-80%  . CARDIOVERSION N/A 01/22/2015   Procedure: CARDIOVERSION;  Surgeon: [name], MD;  Location[facility] ENDOSCOPY;  Service: Cardiovascular;  Laterality: N/A;  . CARDIOVERSION N/A 02/22/2015   Procedure: CARDIOVERSION;  Surgeon: [name], MD;  Location: [facility] ENDOSCOPY;  Service: Cardiovascular;  Laterality: N/A;  . ENUCLEATION  2012   right eye  . Right eye removed      Current Outpatient Prescriptions  Medication Sig Dispense Refill  . Cholecalciferol (VITAMIN D) 1000 UNITS capsule Take 1,000 Units by mouth daily.      . clobetasol cream (TEMOVATE) 0.05 % USE NIGHTLY FOR 1 MONTH (Patient taking differently: Take 1 application as needed for irritation and itching) 60 g 0  . diltiazem (CARDIZEM CD) 120 MG 24 hr capsule Take 1 capsule (120 mg total) by mouth every morning. 30 capsule 1  . diltiazem (CARDIZEM) 30 MG tablet Take one tablet by mouth every 4 hours AS NEEDED for A-fib HR > 100 as long as BP  > 100. 30 tablet 0  . docusate sodium (COLACE) 100 MG capsule Take 300 mg by mouth daily.    . fexofenadine (ALLEGRA) 180 MG tablet Take 180 mg by mouth daily.    . fluticasone (FLONASE) 50 MCG/ACT nasal spray Place 1 spray into both nostrils daily.     [name] Kitchen lisinopril-hydrochlorothiazide (PRINZIDE,ZESTORETIC) 20-12.5 MG tablet Take 0.5 tablets by mouth daily.    . Multiple Vitamins-Minerals (CENTRUM SILVER PO) Take 1 capsule by mouth daily.     . Multiple Vitamins-Minerals (PRESERVISION/LUTEIN PO) Take 1 capsule by mouth daily.     . rivaroxaban (XARELTO) 20 MG TABS tablet Take 1 tablet (20 mg total) by mouth daily. 30 tablet 1   No current facility-administered medications for this visit.     Allergies  Allergen Reactions  . Shellfish Allergy Hives and Swelling  . Atropine     Causes AFIB  . Ceclor [Cefaclor] Hives  . Codeine     Leg pain  . Contrast Media [Iodinated Diagnostic Agents] Hives  . Doxycycline     unknown  . Fenoprofen     unknown  . Fenoprofen Calcium Swelling  . Garlic     Other reaction(s): Bleeding (intolerance)  . Isopto Hyoscine [Scopolamine]     Causes AFIB  . Levaquin [Levofloxacin]     Rash & causes AFIB  . Other     Other reaction(s): Bleeding (intolerance) Nutrasweet Naphon -  swelling  . Oxycodone      Depression  . Penicillins     Has patient had a PCN reaction causing immediate rash, facial/tongue/throat swelling, SOB or lightheadedness with hypotension: unknown Has patient had a PCN reaction causing severe rash involving mucus membranes or skin necrosis: unknown Has patient had a PCN reaction that required hospitalization unknown Has patient had a PCN reaction occurring within the last 10 years:no If all of the above answers are "NO", then may proceed with Cephalosporin use. unknown  . Sulfa Drugs Cross Reactors     unknown  . Warfarin     Other reaction(s): Bleeding (intolerance) From eyes  . Warfarin Sodium     Bleeding  . Tape Rash     Use paper tape    Social History   Social History  . Marital status: Married    Spouse name: N/A  . Number of children: 3  . Years of education: N/A   Occupational History  .  Retired   Social History Main Topics  . Smoking status: Never Smoker  . Smokeless tobacco: Never Used  . Alcohol use 0.0 oz/week     Comment: Rare  . Drug use: No  . Sexual activity: Yes    Birth control/ protection: Post-menopausal     Comment: 1st intercourse 40 yo-1 partner   Other Topics Concern  . 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    Family History  Problem Relation Age of Onset  . Hypertension Father   . Heart attack Father   . Parkinsonism Father   . Diabetes Brother   . Heart disease Brother   . Lung cancer Brother   . Stroke Mother    ROS-   All other systems are reviewed and negative except as per HPI above  Physical Exam: Vitals:   02/18/16 1448  BP: 114/68  Pulse: 72  Weight: 172 lb 9.6 oz (78.3 kg)  Height: 5\' 4"  (1.626 m)    GEN- The patient is well appearing, alert and oriented x 3 today.   Head- normocephalic, atraumatic Eyes-  Prosthetic R eye Ears- hearing intact Oropharynx- clear Neck- supple, no JVP Lungs- Clear to ausculation bilaterally, normal work of breathing Heart- tachycardic irregular rhythm GI- soft, NT, ND, + BS Extremities- no clubbing, cyanosis, or edema Neuro- strength and sensation are intact  ekg today reveals sinus rhythm 72 bpm, PR 200 msec, incomplete RBBB  Assessment and Plan:  1. Persistent atrial fibrillation/ tachy/brady syndrome/ sick sinus syndrome She has been having pauses of up to 7 seconds with presyncope She requires cardizem for rate control.  I would therefore recommend pacemaker implantation at this time.  Risks, benefits, alternatives to pacemaker implantation were discussed in detail with the patient today. The patient understands that the risks include but are not  limited to bleeding, infection, pneumothorax, perforation, tamponade, vascular damage, renal failure, MI, stroke, death,  and lead dislodgement and wishes to proceed. We will therefore schedule the procedure at the next available time.  Will use MDT PPM for MRI compatibility as well as atrial therapies. Hold xarelto 48 hours prior to PPM  2. HTN Stable No change required today   This is a complex patient with a high level of decision making required.     [name] MD, [facility] 02/18/2016 3:33 PM

## 2016-02-18 NOTE — Patient Instructions (Addendum)
Medication Instructions:  Your physician recommends that you continue on your current medications as directed. Please refer to the Current Medication list given to you today.   Labwork: Your physician recommends that you return for lab work today: BMP/CBC   Testing/Procedures: Your physician has recommended that you have a pacemaker inserted. A pacemaker is a small device that is placed under the skin of your chest or abdomen to help control abnormal heart rhythms. This device uses electrical pulses to prompt the heart to beat at a normal rate. Pacemakers are used to treat heart rhythms that are too slow. Wire (leads) are attached to the pacemaker that goes into the chambers of you heart. This is done in the hospital and usually requires and overnight stay. Please see the instruction sheet given to you today for more information.---02/25/16  Please check in at [facility] at 5:30am Do not eat or drink after midnight the night before Do not take any medications the morning of the procedure HOLD Xarelto 48 hours prior to procedure--last dose on 02/22/16 Plan for one night stay and you will need someone to drive you home at discharge     Follow-Up: Your physician recommends that you schedule a follow-up appointment in: 10-14 days from 02/25/16 in the device clinic for wound check and 3 months from 02/25/16 with Dr Rayann Heman   Any Other Special Instructions Will Be Listed Below (If Applicable).     If you need a refill on your cardiac medications before your next appointment, please call your pharmacy.

## 2016-02-19 LAB — BASIC METABOLIC PANEL
BUN / CREAT RATIO: 20 (ref 12–28)
BUN: 18 mg/dL (ref 8–27)
CALCIUM: 9.6 mg/dL (ref 8.7–10.3)
CHLORIDE: 101 mmol/L (ref 96–106)
CO2: 22 mmol/L (ref 18–29)
Creatinine, Ser: 0.9 mg/dL (ref 0.57–1.00)
GFR calc Af Amer: 68 mL/min/{1.73_m2} (ref >59)
GFR calc non Af Amer: 59 mL/min/{1.73_m2} — ABNORMAL LOW (ref >59)
Glucose: 92 mg/dL (ref 65–99)
POTASSIUM: 4.2 mmol/L (ref 3.5–5.2)
Sodium: 141 mmol/L (ref 134–144)

## 2016-02-19 LAB — CBC WITH DIFFERENTIAL/PLATELET
BASOS ABS: 0 10*3/uL (ref 0.0–0.2)
Basos: 0 %
EOS (ABSOLUTE): 0.1 10*3/uL (ref 0.0–0.4)
Eos: 1 %
Hematocrit: 39.1 % (ref 34.0–46.6)
Hemoglobin: 13.2 g/dL (ref 11.1–15.9)
Immature Grans (Abs): 0 10*3/uL (ref 0.0–0.1)
Immature Granulocytes: 0 %
LYMPHS ABS: 2.9 10*3/uL (ref 0.7–3.1)
Lymphs: 30 %
MCH: 34 pg — ABNORMAL HIGH (ref 26.6–33.0)
MCHC: 33.8 g/dL (ref 31.5–35.7)
MCV: 101 fL — ABNORMAL HIGH (ref 79–97)
MONOS ABS: 0.7 10*3/uL (ref 0.1–0.9)
Monocytes: 7 %
Neutrophils Absolute: 6.2 10*3/uL (ref 1.4–7.0)
Neutrophils: 62 %
PLATELETS: 274 10*3/uL (ref 150–379)
RBC: 3.88 x10E6/uL (ref 3.77–5.28)
RDW: 14.2 % (ref 12.3–15.4)
WBC: 9.9 10*3/uL (ref 3.4–10.8)

## 2016-02-19 NOTE — Addendum Note (Signed)
Addended by: Zebedee Iba on: 02/19/2016 03:33 PM   Modules accepted: Orders

## 2016-02-25 ENCOUNTER — Ambulatory Visit (HOSPITAL_COMMUNITY): Payer: Medicare Other

## 2016-02-25 ENCOUNTER — Encounter (HOSPITAL_COMMUNITY)
Admission: RE | Disposition: A | Discharge status: Home / Self Care | Payer: Self-pay | Source: Ambulatory Visit | Attending: Internal Medicine

## 2016-02-25 ENCOUNTER — Encounter (HOSPITAL_COMMUNITY): Payer: Self-pay | Admitting: Internal Medicine

## 2016-02-25 ENCOUNTER — Ambulatory Visit (HOSPITAL_COMMUNITY)
Admission: RE | Admit: 2016-02-25 | Discharge: 2016-02-25 | Disposition: A | Discharge status: Home / Self Care | Payer: Medicare Other | Source: Ambulatory Visit | Attending: Internal Medicine | Admitting: Internal Medicine

## 2016-02-25 DIAGNOSIS — Z880 Allergy status to penicillin: Secondary | ICD-10-CM | POA: Insufficient documentation

## 2016-02-25 DIAGNOSIS — M1990 Unspecified osteoarthritis, unspecified site: Secondary | ICD-10-CM | POA: Diagnosis not present

## 2016-02-25 DIAGNOSIS — R9389 Abnormal findings on diagnostic imaging of other specified body structures: Secondary | ICD-10-CM

## 2016-02-25 DIAGNOSIS — Z801 Family history of malignant neoplasm of trachea, bronchus and lung: Secondary | ICD-10-CM | POA: Insufficient documentation

## 2016-02-25 DIAGNOSIS — I480 Paroxysmal atrial fibrillation: Secondary | ICD-10-CM | POA: Insufficient documentation

## 2016-02-25 DIAGNOSIS — H3530 Unspecified macular degeneration: Secondary | ICD-10-CM | POA: Insufficient documentation

## 2016-02-25 DIAGNOSIS — S4292XG Fracture of left shoulder girdle, part unspecified, subsequent encounter for fracture with delayed healing: Secondary | ICD-10-CM | POA: Diagnosis not present

## 2016-02-25 DIAGNOSIS — X58XXXD Exposure to other specified factors, subsequent encounter: Secondary | ICD-10-CM | POA: Insufficient documentation

## 2016-02-25 DIAGNOSIS — Z882 Allergy status to sulfonamides status: Secondary | ICD-10-CM | POA: Diagnosis not present

## 2016-02-25 DIAGNOSIS — Z823 Family history of stroke: Secondary | ICD-10-CM | POA: Insufficient documentation

## 2016-02-25 DIAGNOSIS — Z833 Family history of diabetes mellitus: Secondary | ICD-10-CM | POA: Insufficient documentation

## 2016-02-25 DIAGNOSIS — Z7951 Long term (current) use of inhaled steroids: Secondary | ICD-10-CM | POA: Diagnosis not present

## 2016-02-25 DIAGNOSIS — Z885 Allergy status to narcotic agent status: Secondary | ICD-10-CM | POA: Diagnosis not present

## 2016-02-25 DIAGNOSIS — Z7901 Long term (current) use of anticoagulants: Secondary | ICD-10-CM | POA: Insufficient documentation

## 2016-02-25 DIAGNOSIS — Z91013 Allergy to seafood: Secondary | ICD-10-CM | POA: Insufficient documentation

## 2016-02-25 DIAGNOSIS — Z959 Presence of cardiac and vascular implant and graft, unspecified: Secondary | ICD-10-CM

## 2016-02-25 DIAGNOSIS — Z8249 Family history of ischemic heart disease and other diseases of the circulatory system: Secondary | ICD-10-CM | POA: Diagnosis not present

## 2016-02-25 DIAGNOSIS — Z91041 Radiographic dye allergy status: Secondary | ICD-10-CM | POA: Insufficient documentation

## 2016-02-25 DIAGNOSIS — Z853 Personal history of malignant neoplasm of breast: Secondary | ICD-10-CM | POA: Diagnosis not present

## 2016-02-25 DIAGNOSIS — I495 Sick sinus syndrome: Secondary | ICD-10-CM | POA: Diagnosis present

## 2016-02-25 DIAGNOSIS — I10 Essential (primary) hypertension: Secondary | ICD-10-CM | POA: Insufficient documentation

## 2016-02-25 HISTORY — DX: Gastro-esophageal reflux disease without esophagitis: K21.9

## 2016-02-25 HISTORY — DX: Malignant neoplasm of unspecified site of right female breast: C50.911

## 2016-02-25 HISTORY — DX: Presence of cardiac pacemaker: Z95.0

## 2016-02-25 HISTORY — PX: EP IMPLANTABLE DEVICE: SHX172B

## 2016-02-25 LAB — SURGICAL PCR SCREEN
MRSA, PCR: NEGATIVE
Staphylococcus aureus: NEGATIVE

## 2016-02-25 SURGERY — PACEMAKER IMPLANT

## 2016-02-25 MED ORDER — VANCOMYCIN HCL IN DEXTROSE 1-5 GM/200ML-% IV SOLN
1000.0000 mg | Freq: Two times a day (BID) | INTRAVENOUS | Status: DC
  Filled 2016-02-25: qty 200

## 2016-02-25 MED ORDER — POLYETHYL GLYCOL-PROPYL GLYCOL 0.4-0.3 % OP GEL
Freq: Three times a day (TID) | OPHTHALMIC | Status: DC
  Filled 2016-02-25: qty 10

## 2016-02-25 MED ORDER — FENTANYL CITRATE (PF) 100 MCG/2ML IJ SOLN
INTRAMUSCULAR | Status: AC
  Filled 2016-02-25: qty 2

## 2016-02-25 MED ORDER — VANCOMYCIN HCL IN DEXTROSE 1-5 GM/200ML-% IV SOLN
1000.0000 mg | INTRAVENOUS | Status: AC
  Administered 2016-02-25: 1000 mg via INTRAVENOUS
  Filled 2016-02-25: qty 200

## 2016-02-25 MED ORDER — DIPHENHYDRAMINE HCL 50 MG/ML IJ SOLN
INTRAMUSCULAR | Status: AC
  Administered 2016-02-25: 50 mg via INTRAVENOUS
  Filled 2016-02-25: qty 1

## 2016-02-25 MED ORDER — CHLORHEXIDINE GLUCONATE 4 % EX LIQD
60.0000 mL | Freq: Once | CUTANEOUS | Status: DC
  Filled 2016-02-25: qty 60

## 2016-02-25 MED ORDER — LIDOCAINE HCL (PF) 1 % IJ SOLN
INTRAMUSCULAR | Status: AC
Start: 2016-02-25 — End: 2016-02-25
  Filled 2016-02-25: qty 60

## 2016-02-25 MED ORDER — RIVAROXABAN 20 MG PO TABS: 20.0000 mg | ORAL_TABLET | Freq: Every day | ORAL | Status: DC

## 2016-02-25 MED ORDER — MUPIROCIN 2 % EX OINT
TOPICAL_OINTMENT | CUTANEOUS | Status: AC
  Filled 2016-02-25: qty 22

## 2016-02-25 MED ORDER — NEOMYCIN-POLYMYXIN-DEXAMETH 0.1 % OP OINT
1.0000 | TOPICAL_OINTMENT | Freq: Every day | OPHTHALMIC | Status: DC
Start: 2016-02-25 — End: 2016-02-25
  Filled 2016-02-25: qty 3.5

## 2016-02-25 MED ORDER — MIDAZOLAM HCL 5 MG/5ML IJ SOLN
INTRAMUSCULAR | Status: AC
  Filled 2016-02-25: qty 5

## 2016-02-25 MED ORDER — METHYLPREDNISOLONE SODIUM SUCC 125 MG IJ SOLR
INTRAMUSCULAR | Status: AC
  Administered 2016-02-25: 125 mg via INTRAVENOUS
  Filled 2016-02-25: qty 2

## 2016-02-25 MED ORDER — SODIUM CHLORIDE 0.9 % IV SOLN: 250.0000 mL | INTRAVENOUS | Status: DC | PRN

## 2016-02-25 MED ORDER — YOU HAVE A PACEMAKER BOOK
Freq: Once | Status: AC
  Administered 2016-02-25: 11:00:00
  Filled 2016-02-25: qty 1

## 2016-02-25 MED ORDER — HEPARIN (PORCINE) IN NACL 2-0.9 UNIT/ML-% IJ SOLN
INTRAMUSCULAR | Status: DC | PRN
  Administered 2016-02-25: 500 mL

## 2016-02-25 MED ORDER — HEPARIN (PORCINE) IN NACL 2-0.9 UNIT/ML-% IJ SOLN
INTRAMUSCULAR | Status: AC
  Filled 2016-02-25: qty 500

## 2016-02-25 MED ORDER — FAMOTIDINE IN NACL 20-0.9 MG/50ML-% IV SOLN
INTRAVENOUS | Status: AC
  Administered 2016-02-25: 20 mg via INTRAVENOUS
  Filled 2016-02-25: qty 50

## 2016-02-25 MED ORDER — LIDOCAINE HCL (PF) 1 % IJ SOLN
INTRAMUSCULAR | Status: DC | PRN
  Administered 2016-02-25: 50 mL

## 2016-02-25 MED ORDER — SODIUM CHLORIDE 0.9% FLUSH
3.0000 mL | Freq: Two times a day (BID) | INTRAVENOUS | Status: DC
  Administered 2016-02-25: 11:00:00 3 mL via INTRAVENOUS

## 2016-02-25 MED ORDER — DOCUSATE SODIUM 100 MG PO CAPS: 300.0000 mg | ORAL_CAPSULE | Freq: Every day | ORAL | Status: DC

## 2016-02-25 MED ORDER — LORATADINE 10 MG PO TABS: 10.0000 mg | ORAL_TABLET | Freq: Every day | ORAL | Status: DC

## 2016-02-25 MED ORDER — SODIUM CHLORIDE 0.9 % IV SOLN
INTRAVENOUS | Status: DC
  Administered 2016-02-25: 07:00:00 via INTRAVENOUS

## 2016-02-25 MED ORDER — LISINOPRIL-HYDROCHLOROTHIAZIDE 20-12.5 MG PO TABS: 0.5000 | ORAL_TABLET | Freq: Every day | ORAL | Status: DC

## 2016-02-25 MED ORDER — LISINOPRIL 10 MG PO TABS
20.0000 mg | ORAL_TABLET | Freq: Every day | ORAL | Status: DC
  Administered 2016-02-25: 11:00:00 20 mg via ORAL
  Filled 2016-02-25: qty 4

## 2016-02-25 MED ORDER — ONDANSETRON HCL 4 MG/2ML IJ SOLN: 4.0000 mg | Freq: Four times a day (QID) | INTRAMUSCULAR | Status: DC | PRN

## 2016-02-25 MED ORDER — SODIUM CHLORIDE 0.9 % IR SOLN
Status: AC
  Filled 2016-02-25: qty 2

## 2016-02-25 MED ORDER — HYDROCHLOROTHIAZIDE 12.5 MG PO CAPS
12.5000 mg | ORAL_CAPSULE | Freq: Every day | ORAL | Status: DC
  Administered 2016-02-25: 11:00:00 12.5 mg via ORAL
  Filled 2016-02-25: qty 1

## 2016-02-25 MED ORDER — GENTAMICIN SULFATE 40 MG/ML IJ SOLN
80.0000 mg | INTRAMUSCULAR | Status: AC
  Administered 2016-02-25: 80 mg
  Filled 2016-02-25: qty 2

## 2016-02-25 MED ORDER — DILTIAZEM HCL ER COATED BEADS 120 MG PO CP24: 120.0000 mg | ORAL_CAPSULE | Freq: Every morning | ORAL | Status: DC

## 2016-02-25 MED ORDER — MUPIROCIN 2 % EX OINT
TOPICAL_OINTMENT | Freq: Two times a day (BID) | CUTANEOUS | Status: DC
  Administered 2016-02-25: 07:00:00 via NASAL
  Filled 2016-02-25: qty 22

## 2016-02-25 MED ORDER — METHYLPREDNISOLONE SODIUM SUCC 125 MG IJ SOLR
125.0000 mg | INTRAMUSCULAR | Status: AC
  Administered 2016-02-25: 125 mg via INTRAVENOUS

## 2016-02-25 MED ORDER — ACETAMINOPHEN 325 MG PO TABS: 325.0000 mg | ORAL_TABLET | ORAL | Status: DC | PRN

## 2016-02-25 MED ORDER — VANCOMYCIN HCL IN DEXTROSE 1-5 GM/200ML-% IV SOLN
INTRAVENOUS | Status: AC
Start: 2016-02-25 — End: 2016-02-25
  Filled 2016-02-25: qty 200

## 2016-02-25 MED ORDER — FENTANYL CITRATE (PF) 100 MCG/2ML IJ SOLN
INTRAMUSCULAR | Status: DC | PRN
  Administered 2016-02-25 (×2): 12.5 ug via INTRAVENOUS

## 2016-02-25 MED ORDER — SODIUM CHLORIDE 0.9 % IV SOLN: INTRAVENOUS | Status: DC

## 2016-02-25 MED ORDER — MIDAZOLAM HCL 5 MG/5ML IJ SOLN
INTRAMUSCULAR | Status: DC | PRN
  Administered 2016-02-25 (×3): 1 mg via INTRAVENOUS

## 2016-02-25 MED ORDER — FAMOTIDINE IN NACL 20-0.9 MG/50ML-% IV SOLN
20.0000 mg | INTRAVENOUS | Status: AC
  Administered 2016-02-25: 20 mg via INTRAVENOUS

## 2016-02-25 MED ORDER — HYPROMELLOSE (GONIOSCOPIC) 2.5 % OP SOLN
1.0000 [drp] | Freq: Three times a day (TID) | OPHTHALMIC | Status: DC
  Filled 2016-02-25: qty 15

## 2016-02-25 MED ORDER — SODIUM CHLORIDE 0.9% FLUSH: 3.0000 mL | INTRAVENOUS | Status: DC | PRN

## 2016-02-25 MED ORDER — DIPHENHYDRAMINE HCL 50 MG/ML IJ SOLN
50.0000 mg | INTRAMUSCULAR | Status: AC
  Administered 2016-02-25: 50 mg via INTRAVENOUS

## 2016-02-25 SURGICAL SUPPLY — 7 items
CABLE SURGICAL S-101-97-12 (CABLE) ×2 IMPLANT
LEAD CAPSURE NOVUS 5076-52CM (Lead) ×2 IMPLANT
LEAD CAPSURE NOVUS 5076-58CM (Lead) ×2 IMPLANT
PAD DEFIB LIFELINK (PAD) ×3 IMPLANT
PPM ADVISA MRI DR A2DR01 (Pacemaker) ×2 IMPLANT
SHEATH CLASSIC 7F (SHEATH) ×6 IMPLANT
TRAY PACEMAKER INSERTION (PACKS) ×3 IMPLANT

## 2016-02-25 NOTE — Progress Notes (Signed)
Patient off floor for chest x-ray at this time via wheelchair. Megan Hester has been at bedside and interrogated pacemaker.

## 2016-02-25 NOTE — Progress Notes (Signed)
Patient has ambulated in room and Dr. Rayann Heman has called and stated it is okay to discharge patient home. He has reviewed chest x-ray. Dr. Rayann Heman also states it is okay to discharge patient home without second dose of Vancomycin. Patient discharged at this time via wheelchair. No S/S of distress noted or complaints voiced at time of discharge.

## 2016-02-25 NOTE — Progress Notes (Signed)
Patient is back from radiology at this time.

## 2016-02-25 NOTE — Progress Notes (Signed)
Spoke with patient She is doing well Will dischage to home    CXR reveals stable leads, no ptx.  I did speak with radiology who notes a L shoulder fracture of unknown duration.   Subsequently, upon speaking with the patient, she reports having a shoulder fracture about 15 years ago which required non surgical healing.  She has chronic ROM issues.  She denies any recent trauma and is clear that she has no pain or significant change in her chronic mobility. She does not wish to have additional testing at this time.   DC to home. Routine wound care and follow-up Resume xarelto 02/26/16 pm  [name] MD, [facility] 02/25/2016 5:30 PM

## 2016-02-25 NOTE — Interval H&P Note (Signed)
History and Physical Interval Note:  02/25/2016 6:05 AM  Megan Hester  has presented today for surgery, with the diagnosis of afib  The various methods of treatment have been discussed with the patient and family. After consideration of risks, benefits and other options for treatment, the patient has consented to  Procedure(s): Pacemaker Implant (N/A) as a surgical intervention .  The patient's history has been reviewed, patient examined, no change in status, stable for surgery.  I have reviewed the patient's chart and labs.  Questions were answered to the patient's satisfaction.     Megan Hester

## 2016-02-25 NOTE — Discharge Instructions (Signed)
° ° °  Supplemental Discharge Instructions for  Pacemaker  Patients  Activity No heavy lifting or vigorous activity with your left/right arm for 6 to 8 weeks.  Do not raise your left/right arm above your head for one week.  Gradually raise your affected arm as drawn below.           __      02/29/16                 03/01/16                          03/02/16              03/03/16  NO DRIVING for 1 week    ; you may begin driving on  [id number]   .  WOUND CARE - Keep the wound area clean and dry.  Do not get this area wet for one week. No showers for one week; you may shower on  03/03/16   . - The tape/steri-strips on your wound will fall off; do not pull them off.  No bandage is needed on the site.  DO  NOT apply any creams, oils, or ointments to the wound area. - If you notice any drainage or discharge from the wound, any swelling or bruising at the site, or you develop a fever > 101? F after you are discharged home, call the office at once.  Special Instructions - You are still able to use cellular telephones; use the ear opposite the side where you have your pacemaker/defibrillator.  Avoid carrying your cellular phone near your device. - When traveling through airports, show security personnel your identification card to avoid being screened in the metal detectors.  Ask the security personnel to use the hand wand. - Avoid arc welding equipment, MRI testing (magnetic resonance imaging), TENS units (transcutaneous nerve stimulators).  Call the office for questions about other devices. - Avoid electrical appliances that are in poor condition or are not properly grounded. - Microwave ovens are safe to be near or to operate.    Resume xarelto 02/26/15 with the evening meal

## 2016-02-25 NOTE — Care Management Note (Signed)
Case Management Note  Patient Details  Name: Megan Hester MRN: [id number] Date of Birth: 10/04/1930  Subjective/Objective:  S/p pacem maker implant, NCM will cont to follow for dc needs.                 Action/Plan:   Expected Discharge Date:                  Expected Discharge Plan:  Home/Self Care  In-House Referral:     Discharge planning Services  CM Consult  Post Acute Care Choice:    Choice offered to:     DME Arranged:    DME Agency:     HH Arranged:    HH Agency:     Status of Service:  In process, will continue to follow  If discussed at Long Length of Stay Meetings, dates discussed:    Additional Comments:  Zenon Mayo, RN 02/25/2016, 4:02 PM

## 2016-02-25 NOTE — H&P (View-Only) (Signed)
PCP: Geoffery Lyons, MD  Megan Hester is a 83 y.o. female with a h/o atrial fibrillation who presents today for follow-up.  She has begun having episodes of dizziness and presyncope over the past month.  She was evaluated by Butch Penny in the AF clinic and found to have sick sinus syndrome with pauses of up to 7 seconds on holter monitor.  She also has afib with elevated V rates for which she requires cardizem.  Today, she denies symptoms of orthopnea, PND, lower extremity edema,  syncope, or neurologic sequela.  She is tolerating xarelto without significant bleeding.  Past Medical History:  Diagnosis Date  . Arthritis   . Cancer ([place]) 1999   Breast cancer--DCIS  . Hypertension   . Macular degeneration   . Paroxysmal atrial fibrillation (HCC)   . 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 BREAST LUMPECTOMY  1999   RADIATION  . CARDIAC CATHETERIZATION  02/05/1999   HYPERDYNAMIC LEFT VENTRICLE WITH EF OF 75-80%  . CARDIOVERSION N/A 01/22/2015   Procedure: CARDIOVERSION;  Surgeon: [name], MD;  [facility] END[facility]COPY;  Service: Cardiovascular;  Laterality: N/A;  . CARDIOVERSION N/A 02/22/2015   Procedure: CARDIOVERSION;  Surgeon: [name], MD;  Location: [facility] ENDOSCOPY;  Service: Cardiovascular;  Laterality: N/A;  . ENUCLEATION  2012   right eye  . Right eye removed      Current Outpatient Prescriptions  Medication Sig Dispense Refill  . Cholecalciferol (VITAMIN D) 1000 UNITS capsule Take 1,000 Units by mouth daily.      . clobetasol cream (TEMOVATE) 0.05 % USE NIGHTLY FOR 1 MONTH (Patient taking differently: Take 1 application as needed for irritation and itching) 60 g 0  . diltiazem (CARDIZEM CD) 120 MG 24 hr capsule Take 1 capsule (120 mg total) by mouth every morning. 30 capsule 1  . diltiazem (CARDIZEM) 30 MG tablet Take one tablet by mouth every 4 hours AS NEEDED for A-fib HR > 100 as long as BP  > 100. 30 tablet 0  . docusate sodium (COLACE) 100 MG capsule Take 300 mg by mouth daily.    . fexofenadine (ALLEGRA) 180 MG tablet Take 180 mg by mouth daily.    . fluticasone (FLONASE) 50 MCG/ACT nasal spray Place 1 spray into both nostrils daily.     [name] Kitchen lisinopril-hydrochlorothiazide (PRINZIDE,ZESTORETIC) 20-12.5 MG tablet Take 0.5 tablets by mouth daily.    . Multiple Vitamins-Minerals (CENTRUM SILVER PO) Take 1 capsule by mouth daily.     . Multiple Vitamins-Minerals (PRESERVISION/LUTEIN PO) Take 1 capsule by mouth daily.     . rivaroxaban (XARELTO) 20 MG TABS tablet Take 1 tablet (20 mg total) by mouth daily. 30 tablet 1   No current facility-administered medications for this visit.     Allergies  Allergen Reactions  . Shellfish Allergy Hives and Swelling  . Atropine     Causes AFIB  . Ceclor [Cefaclor] Hives  . Codeine     Leg pain  . Contrast Media [Iodinated Diagnostic Agents] Hives  . Doxycycline     unknown  . Fenoprofen     unknown  . Fenoprofen Calcium Swelling  . Garlic     Other reaction(s): Bleeding (intolerance)  . Isopto Hyoscine [Scopolamine]     Causes AFIB  . Levaquin [Levofloxacin]     Rash & causes AFIB  . Other     Other reaction(s): Bleeding (intolerance) Nutrasweet Naphon -  swelling  . Oxycodone      Depression  . Penicillins     Has patient had a PCN reaction causing immediate rash, facial/tongue/throat swelling, SOB or lightheadedness with hypotension: unknown Has patient had a PCN reaction causing severe rash involving mucus membranes or skin necrosis: unknown Has patient had a PCN reaction that required hospitalization unknown Has patient had a PCN reaction occurring within the last 10 years:no If all of the above answers are "NO", then may proceed with Cephalosporin use. unknown  . Sulfa Drugs Cross Reactors     unknown  . Warfarin     Other reaction(s): Bleeding (intolerance) From eyes  . Warfarin Sodium     Bleeding  . Tape Rash     Use paper tape    Social History   Social History  . Marital status: Married    Spouse name: N/A  . Number of children: 3  . Years of education: N/A   Occupational History  .  Retired   Social History Main Topics  . Smoking status: Never Smoker  . Smokeless tobacco: Never Used  . Alcohol use 0.0 oz/week     Comment: Rare  . Drug use: No  . Sexual activity: Yes    Birth control/ protection: Post-menopausal     Comment: 1st intercourse 55 yo-1 partner   Other Topics Concern  . 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    Family History  Problem Relation Age of Onset  . Hypertension Father   . Heart attack Father   . Parkinsonism Father   . Diabetes Brother   . Heart disease Brother   . Lung cancer Brother   . Stroke Mother    ROS-   All other systems are reviewed and negative except as per HPI above  Physical Exam: Vitals:   02/18/16 1448  BP: 114/68  Pulse: 72  Weight: 172 lb 9.6 oz (78.3 kg)  Height: 5\' 4"  (1.626 m)    GEN- The patient is well appearing, alert and oriented x 3 today.   Head- normocephalic, atraumatic Eyes-  Prosthetic R eye Ears- hearing intact Oropharynx- clear Neck- supple, no JVP Lungs- Clear to ausculation bilaterally, normal work of breathing Heart- tachycardic irregular rhythm GI- soft, NT, ND, + BS Extremities- no clubbing, cyanosis, or edema Neuro- strength and sensation are intact  ekg today reveals sinus rhythm 72 bpm, PR 200 msec, incomplete RBBB  Assessment and Plan:  1. Persistent atrial fibrillation/ tachy/brady syndrome/ sick sinus syndrome She has been having pauses of up to 7 seconds with presyncope She requires cardizem for rate control.  I would therefore recommend pacemaker implantation at this time.  Risks, benefits, alternatives to pacemaker implantation were discussed in detail with the patient today. The patient understands that the risks include but are not  limited to bleeding, infection, pneumothorax, perforation, tamponade, vascular damage, renal failure, MI, stroke, death,  and lead dislodgement and wishes to proceed. We will therefore schedule the procedure at the next available time.  Will use MDT PPM for MRI compatibility as well as atrial therapies. Hold xarelto 48 hours prior to PPM  2. HTN Stable No change required today   This is a complex patient with a high level of decision making required.     [name] MD, [facility] 02/18/2016 3:33 PM

## 2016-02-26 ENCOUNTER — Telehealth: Payer: Self-pay | Admitting: *Deleted

## 2016-02-26 NOTE — Telephone Encounter (Signed)
Spoke with patient.  She is "feeling great".  Patient reports that she sent a transmission with her home monitor this morning.  She is unable to read the SN, but had her daughter-in-law help her.  Attempted to associate monitor in [facility], but transmission still not received.  Advised patient and daughter-in-law that I will call tech services and call them back.  They verbalize understanding and appreciation.

## 2016-02-26 NOTE — Telephone Encounter (Signed)
Spoke with patient.  She reports she has taken the sling off as instructed.  Patient states she has had "very little" pain at the site and is feeling "so great".  She is aware of instructions for activity and arm movement.  Patient denies ShOB, chest discomfort, dizziness, or any other symptoms.  Advised patient that I will call her back later this afternoon as I am waiting on assistance from [employer] rep with setting up her home monitor.  Patient verbalizes understanding and appreciation.  She denies additional questions or concerns at this time.

## 2016-02-26 NOTE — Telephone Encounter (Signed)
Megan Hester, [employer] rep, assisted patient in sending manual transmission and gave information to Dr. Rayann Heman.  Transmission WNL, auto test results consistent with implant measurements, no episodes or abnormalities noted.

## 2016-02-27 ENCOUNTER — Telehealth: Payer: Self-pay | Admitting: Physician Assistant

## 2016-02-27 NOTE — Telephone Encounter (Signed)
83 y.o. female with paroxysmal AF and tachy-brady syndrome s/p PPM earlier this week. She feels like she went into AF last night.  Her BP was low this AM after taking Cardizem CD 120 and Lisinopril/HCTZ 1/2 tab (70s/50s). Her BP is now 100/60s.  Therefore, she took Diltiazem 30 mg x 1 ~ 2pm. She feels like her symptoms have calmed down somewhat.   She does not feel any different than she usually does with atrial fibrillation. Her pacer site is doing well without pain or swelling. She denies chest pain, shortness of breath. PLAN: 1. Take extra Diltiazem 30 mg around 4pm if still in atrial fibrillation. 2. Hold Lisinopril/HCTZ until systolic BP > [id number]. 3. If symptoms worsen, call 911 (active snow storm at this time) to go to the ED. She agrees with this plan. Richardson Dopp, PA-C   02/27/2016 3:00 PM

## 2016-02-29 ENCOUNTER — Telehealth: Payer: Self-pay | Admitting: Internal Medicine

## 2016-02-29 NOTE — Telephone Encounter (Signed)
Called pt back and informed her that if her HR was [id number], and her systolic or the top number on the BP reading was 100, that patient should take the prescribed 30mg  Cardizem, and to go about her normal daily activities. Pt voiced understanding.

## 2016-02-29 NOTE — Telephone Encounter (Signed)
New Message  Pacemaker implanted Monday since them pulse has been wild wants to know what she can do.  Please f/u

## 2016-03-04 ENCOUNTER — Telehealth (HOSPITAL_COMMUNITY): Payer: Self-pay | Admitting: *Deleted

## 2016-03-04 NOTE — Telephone Encounter (Signed)
Patient states she is feeling much better - felt like yesterday was her "turn around day". HR is staying controlled now. Decreasing lisinopril to tolerate cardizem made her feel much improved. She will call if further issues or concerns.

## 2016-03-04 NOTE — Telephone Encounter (Signed)
-----   Message from [name], MD sent at 03/03/2016 11:00 PM EST ----- Please check on patient.

## 2016-03-06 ENCOUNTER — Ambulatory Visit (INDEPENDENT_AMBULATORY_CARE_PROVIDER_SITE_OTHER): Payer: Medicare Other | Admitting: *Deleted

## 2016-03-06 DIAGNOSIS — I495 Sick sinus syndrome: Secondary | ICD-10-CM

## 2016-03-06 NOTE — Progress Notes (Signed)
Wound check appointment. Steri-strips removed. Wound without redness or edema. Incision edges approximated, wound well healed. Normal device function. Thresholds, sensing, and impedances consistent with implant measurements. Device programmed at 3.5V for extra safety margin until 3 month visit. Histogram distribution appropriate for patient and level of activity. 69% AT/AF + Xarelto. 31 Fast A&V- EGMs AF w/ RVR. 6 VT~ AF w/ RVR. Right shift in ventricular rates noted~ pt stated that she had been taking PRN Cardizem Q4hrs w/ minimal relief in AF symptoms, pt agreed to apt w/ Afib clinic on 03/10/2016.  Patient educated about wound care, arm mobility, lifting restrictions. ROV 05/26/2016 w/ JA.

## 2016-03-09 ENCOUNTER — Other Ambulatory Visit (HOSPITAL_COMMUNITY): Payer: Self-pay | Admitting: Nurse Practitioner

## 2016-03-10 ENCOUNTER — Encounter (HOSPITAL_COMMUNITY): Payer: Self-pay | Admitting: Nurse Practitioner

## 2016-03-10 ENCOUNTER — Ambulatory Visit (HOSPITAL_COMMUNITY)
Admission: RE | Admit: 2016-03-10 | Discharge: 2016-03-10 | Disposition: A | Discharge status: Home / Self Care | Payer: Medicare Other | Source: Ambulatory Visit | Attending: Nurse Practitioner | Admitting: Nurse Practitioner

## 2016-03-10 VITALS — BP 100/68 | HR 150 | Ht 64.0 in | Wt 172.4 lb

## 2016-03-10 DIAGNOSIS — Z833 Family history of diabetes mellitus: Secondary | ICD-10-CM | POA: Diagnosis not present

## 2016-03-10 DIAGNOSIS — I495 Sick sinus syndrome: Secondary | ICD-10-CM | POA: Insufficient documentation

## 2016-03-10 DIAGNOSIS — Z853 Personal history of malignant neoplasm of breast: Secondary | ICD-10-CM | POA: Insufficient documentation

## 2016-03-10 DIAGNOSIS — Z79899 Other long term (current) drug therapy: Secondary | ICD-10-CM | POA: Insufficient documentation

## 2016-03-10 DIAGNOSIS — Z823 Family history of stroke: Secondary | ICD-10-CM | POA: Insufficient documentation

## 2016-03-10 DIAGNOSIS — Z950 Presence of cardiac pacemaker: Secondary | ICD-10-CM | POA: Diagnosis not present

## 2016-03-10 DIAGNOSIS — Z7901 Long term (current) use of anticoagulants: Secondary | ICD-10-CM | POA: Insufficient documentation

## 2016-03-10 DIAGNOSIS — I481 Persistent atrial fibrillation: Secondary | ICD-10-CM | POA: Insufficient documentation

## 2016-03-10 DIAGNOSIS — I10 Essential (primary) hypertension: Secondary | ICD-10-CM | POA: Insufficient documentation

## 2016-03-10 DIAGNOSIS — Z888 Allergy status to other drugs, medicaments and biological substances status: Secondary | ICD-10-CM | POA: Insufficient documentation

## 2016-03-10 DIAGNOSIS — Z801 Family history of malignant neoplasm of trachea, bronchus and lung: Secondary | ICD-10-CM | POA: Insufficient documentation

## 2016-03-10 DIAGNOSIS — H3530 Unspecified macular degeneration: Secondary | ICD-10-CM | POA: Diagnosis not present

## 2016-03-10 DIAGNOSIS — I4819 Other persistent atrial fibrillation: Secondary | ICD-10-CM

## 2016-03-10 DIAGNOSIS — Z91013 Allergy to seafood: Secondary | ICD-10-CM | POA: Diagnosis not present

## 2016-03-10 DIAGNOSIS — Z9889 Other specified postprocedural states: Secondary | ICD-10-CM | POA: Diagnosis not present

## 2016-03-10 DIAGNOSIS — I480 Paroxysmal atrial fibrillation: Secondary | ICD-10-CM | POA: Diagnosis not present

## 2016-03-10 DIAGNOSIS — Z8249 Family history of ischemic heart disease and other diseases of the circulatory system: Secondary | ICD-10-CM | POA: Insufficient documentation

## 2016-03-10 DIAGNOSIS — Z820 Family history of epilepsy and other diseases of the nervous system: Secondary | ICD-10-CM | POA: Insufficient documentation

## 2016-03-10 DIAGNOSIS — K219 Gastro-esophageal reflux disease without esophagitis: Secondary | ICD-10-CM | POA: Diagnosis not present

## 2016-03-10 DIAGNOSIS — Z880 Allergy status to penicillin: Secondary | ICD-10-CM | POA: Insufficient documentation

## 2016-03-10 MED ORDER — DILTIAZEM HCL ER COATED BEADS 120 MG PO CP24: 120.0000 mg | ORAL_CAPSULE | Freq: Two times a day (BID) | ORAL | 3 refills | Status: DC

## 2016-03-10 MED ORDER — DILTIAZEM HCL 30 MG PO TABS: ORAL_TABLET | ORAL | 1 refills | Status: DC

## 2016-03-10 NOTE — Patient Instructions (Addendum)
Your physician has recommended you make the following change in your medication:  1)increase cardizem 120mg  to twice a day

## 2016-03-10 NOTE — Progress Notes (Addendum)
Patient ID: Megan Hester, female   DOB: Nov 10, 1930, 83 y.o.   MRN: [id number]      Primary Care Physician: Geoffery Lyons, MD Referring Physician: Dr. Gean Birchwood is a 83 y.o. female that was seen  in the afib clinic for f/u with h/o of persistent afib after being started on flecainide 100 mg bid resulting in  SR . She feels well. Will feel an occasional flutter. On Xarelto without bleeding issues. Continues on flecainide 100 mg bid.  F/u 12/7 and is staying in SR but has noticed weak spells for about a week, feels woozy for a couple of seconds. HR 57 bpm, with first degree AV block but PR int is actually shorter than on previous visits. Has had no change in medical condition or meds. Is pleased that she has not had any afib.. 48 hour monitor placed and diltiazem dose was decreased.  F/u 02/12/16 for review after 48 hour monitor results being received. It does show SR with 3-7 sec pauses mostly at night. She states that the woozy spells were worse than usual when wearing the monitor and getting up at night to use the bathroom. Not as symptomatic during the day.  Pt is here for f/u for PPM for SSS. Her Cardizem daily dose and flecainide was stopped when it was discovered she was having pauses and now she is having issues with afib with rvr. She is having to use prn Cardizem a lot but still not with good results. BP has been on low side and lisinopril/ace on hold. She may need to be reloaded on flecainide. Pacer site well healed and has been seen in device clinic with interrogation 03/06/16.  Today, she denies symptoms of palpitations, chest pain, shortness of breath, orthopnea, PND, lower extremity edema, dizziness, presyncope, syncope, or neurologic sequela. The patient is tolerating medications without difficulties and is otherwise without complaint today.   Past Medical History:  Diagnosis Date  . Arthritis    "all over my body" (02/25/2016)  . Cancer of right breast ([place])  1999   DCIS  . GERD (gastroesophageal reflux disease)    hx; "when I was working"  . Hypertension   . Macular degeneration   . Paroxysmal atrial fibrillation (HCC)   . Presence of permanent cardiac pacemaker   . 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 BREAST BIOPSY Right   . BREAST LUMPECTOMY Right 1999   RADIATION  . CARDIAC CATHETERIZATION  02/05/1999   HYPERDYNAMIC LEFT VENTRICLE WITH EF OF 75-80%  . CARDIOVERSION N/A 01/22/2015   Procedure: CARDIOVERSION;  Surgeon: [name], MD;  Location: [facility] ENDOSCOPY;  Service: Cardiovascular;  Laterality: N/A;  . CARDIOVERSION N/A 02/22/2015   Procedure: CARDIOVERSION;  Surgeon: [name], MD;  Location: [facility] ENDOSCOPY;  Service: Cardiovascular;  Laterality: N/A;  . DILATION AND CURETTAGE OF UTERUS    . ENUCLEATION Right 2012  . EP IMPLANTABLE DEVICE N/A 02/25/2016   Procedure: Pacemaker Implant;  Surgeon: [name], MD;  Location: [facility] CV LAB;  Service: Cardiovascular;  Laterality: N/A;  . INSERT / REPLACE / REMOVE PACEMAKER    . TONSILLECTOMY      Current Outpatient Prescriptions  Medication Sig Dispense Refill  . acetaminophen (TYLENOL) 650 MG CR tablet Take 650 mg by mouth daily as needed for pain.    . calcium carbonate (TUMS - DOSED IN MG ELEMENTAL CALCIUM) 500  MG chewable tablet Chew 1-2 tablets by mouth daily as needed for indigestion or heartburn.    . Cholecalciferol (VITAMIN D) 1000 UNITS capsule Take 1,000 Units by mouth daily.      . clobetasol cream (TEMOVATE) 0.05 % USE NIGHTLY FOR 1 MONTH (Patient taking differently: Take 1 application as needed for irritation and itching) 60 g 0  . diltiazem (CARDIZEM CD) 120 MG 24 hr capsule Take 1 capsule (120 mg total) by mouth 2 (two) times daily. 60 capsule 3  . diltiazem (CARDIZEM) 30 MG tablet Take one tablet by mouth every 4 hours AS NEEDED for A-fib HR > 100 as long as BP > 100. 45 tablet  1  . docusate sodium (COLACE) 100 MG capsule Take 300 mg by mouth at bedtime.     . fexofenadine (ALLEGRA) 180 MG tablet Take 180 mg by mouth daily.    . fluticasone (FLONASE) 50 MCG/ACT nasal spray Place 1 spray into both nostrils at bedtime.     . Hypromellose (ARTIFICIAL TEARS OP) Place 1 drop into the left eye 3 (three) times daily.    . Multiple Vitamins-Minerals (CENTRUM SILVER PO) Take 1 capsule by mouth daily.     . Multiple Vitamins-Minerals (PRESERVISION/LUTEIN PO) Take 1 capsule by mouth 2 (two) times daily.     [name] Kitchen neomycin-polymyxin-dexameth (MAXITROL) 0.1 % OINT Place 1 application into the right eye at bedtime.    Vladimir Faster Glycol-Propyl Glycol (SYSTANE OP) Place 1 drop into the right eye 3 (three) times daily.    . rivaroxaban (XARELTO) 20 MG TABS tablet Take 1 tablet (20 mg total) by mouth daily with supper.    . triamcinolone cream (KENALOG) 0.1 % Apply 1 application topically daily as needed (rash).    [name] Kitchen lisinopril-hydrochlorothiazide (PRINZIDE,ZESTORETIC) 20-12.5 MG tablet Take 0.5 tablets by mouth daily.     No current facility-administered medications for this encounter.     Allergies  Allergen Reactions  . Shellfish Allergy Hives and Swelling    Shrimp and lobster only  . Atropine     Causes AFIB  . Ceclor [Cefaclor] Hives  . Codeine     Leg pain  . Contrast Media [Iodinated Diagnostic Agents] Hives  . Doxycycline     unknown  . Fenoprofen Calcium Swelling  . Isopto Hyoscine [Scopolamine]     Causes AFIB  . Levaquin [Levofloxacin]     Rash & causes AFIB  . Other     Other reaction(s): Bleeding (intolerance) Nutrasweet  Naphon - swelling  . Oxycodone     Depression - pt tolerates as needed   . Penicillins     Has patient had a PCN reaction causing immediate rash, facial/tongue/throat swelling, SOB or lightheadedness with hypotension: unknown Has patient had a PCN reaction causing severe rash involving mucus membranes or skin necrosis: unknown Has  patient had a PCN reaction that required hospitalization unknown Has patient had a PCN reaction occurring within the last 10 years:no If all of the above answers are "NO", then may proceed with Cephalosporin use. unknown  . Sulfa Drugs Cross Reactors     Ankle swelling   . Warfarin     Other reaction(s): Bleeding (intolerance) From eyes  . Tape Rash    Use paper tape    Social History   Social History  . Marital status: Married    Spouse name: N/A  . Number of children: 3  . Years of education: N/A   Occupational History  .  Retired   Science writer  History Main Topics  . Smoking status: Never Smoker  . Smokeless tobacco: Never Used  . Alcohol use 0.0 oz/week     Comment: 02/25/2016 "glass of wine/month"  . Drug use: No  . Sexual activity: Yes    Birth control/ protection: Post-menopausal     Comment: 1st intercourse 67 yo-1 partner   Other Topics Concern  . 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    Family History  Problem Relation Age of Onset  . Hypertension Father   . Heart attack Father   . Parkinsonism Father   . Diabetes Brother   . Heart disease Brother   . Lung cancer Brother   . Stroke Mother     ROS- All systems are reviewed and negative except as per the HPI above  Physical Exam: Vitals:   03/10/16 1354  BP: 100/68  Pulse: (!) 150  Weight: 172 lb 6.4 oz (78.2 kg)  Height: 5\' 4"  (1.626 m)    GEN- The patient is well appearing, alert and oriented x 3 today.   Head- normocephalic, atraumatic Eyes-  Sclera clear, conjunctiva pink Ears- hearing intact Oropharynx- clear Neck- supple, no JVP Lymph- no cervical lymphadenopathy Lungs- Clear to ausculation bilaterally, normal work of breathing Heart- irregular rate and rhythm, no murmurs, rubs or gallops, PMI not laterally displaced. PPM site healed GI- soft, NT, ND, + BS Extremities- no clubbing, cyanosis, or edema MS- no significant deformity or  atrophy Skin- no rash or lesion Psych- euthymic mood, full affect Neuro- strength and sensation are intact  EKG- afib with rvr at 150 bpm, qrs int 100 ms, qtc 486 ms 48 hour monitor reviewed that showed 3-7 sec pauses, some intermittent afib Epic records reviewed   Assessment and Plan: 1. Persistent afib S/p PPM,02/25/16, for SSS Now with afib with rvr Increase cardizem to 120 mg bid for rate control Hold off using 30 mg tabs to keep BP up until it is seen how 120 mg bid affects BP Continue to hold lis/hctz Continue xarelto  Will probably have to reload on flecainide to restore SR   F/u afib clinic on Wednesday  Fain Francis C. Ancel Easler, [facility] [address] [place], [place] [address] [phone]

## 2016-03-12 ENCOUNTER — Encounter (HOSPITAL_COMMUNITY): Payer: Self-pay | Admitting: Nurse Practitioner

## 2016-03-12 ENCOUNTER — Ambulatory Visit (HOSPITAL_COMMUNITY)
Admission: RE | Admit: 2016-03-12 | Discharge: 2016-03-12 | Disposition: A | Discharge status: Home / Self Care | Payer: Medicare Other | Source: Ambulatory Visit | Attending: Nurse Practitioner | Admitting: Nurse Practitioner

## 2016-03-12 ENCOUNTER — Other Ambulatory Visit: Payer: Self-pay | Admitting: Registered Nurse

## 2016-03-12 VITALS — BP 134/64 | HR 106 | Ht 64.0 in | Wt 173.8 lb

## 2016-03-12 DIAGNOSIS — Z833 Family history of diabetes mellitus: Secondary | ICD-10-CM | POA: Diagnosis not present

## 2016-03-12 DIAGNOSIS — Z7901 Long term (current) use of anticoagulants: Secondary | ICD-10-CM | POA: Insufficient documentation

## 2016-03-12 DIAGNOSIS — K219 Gastro-esophageal reflux disease without esophagitis: Secondary | ICD-10-CM | POA: Diagnosis not present

## 2016-03-12 DIAGNOSIS — Z888 Allergy status to other drugs, medicaments and biological substances status: Secondary | ICD-10-CM | POA: Insufficient documentation

## 2016-03-12 DIAGNOSIS — H3530 Unspecified macular degeneration: Secondary | ICD-10-CM | POA: Insufficient documentation

## 2016-03-12 DIAGNOSIS — Z823 Family history of stroke: Secondary | ICD-10-CM | POA: Diagnosis not present

## 2016-03-12 DIAGNOSIS — I481 Persistent atrial fibrillation: Secondary | ICD-10-CM

## 2016-03-12 DIAGNOSIS — Z801 Family history of malignant neoplasm of trachea, bronchus and lung: Secondary | ICD-10-CM | POA: Diagnosis not present

## 2016-03-12 DIAGNOSIS — Z853 Personal history of malignant neoplasm of breast: Secondary | ICD-10-CM | POA: Insufficient documentation

## 2016-03-12 DIAGNOSIS — Z950 Presence of cardiac pacemaker: Secondary | ICD-10-CM | POA: Diagnosis not present

## 2016-03-12 DIAGNOSIS — Z880 Allergy status to penicillin: Secondary | ICD-10-CM | POA: Insufficient documentation

## 2016-03-12 DIAGNOSIS — I4819 Other persistent atrial fibrillation: Secondary | ICD-10-CM

## 2016-03-12 DIAGNOSIS — Z8249 Family history of ischemic heart disease and other diseases of the circulatory system: Secondary | ICD-10-CM | POA: Insufficient documentation

## 2016-03-12 DIAGNOSIS — R1011 Right upper quadrant pain: Secondary | ICD-10-CM

## 2016-03-12 DIAGNOSIS — I495 Sick sinus syndrome: Secondary | ICD-10-CM | POA: Diagnosis not present

## 2016-03-12 DIAGNOSIS — Z820 Family history of epilepsy and other diseases of the nervous system: Secondary | ICD-10-CM | POA: Diagnosis not present

## 2016-03-12 DIAGNOSIS — Z91013 Allergy to seafood: Secondary | ICD-10-CM | POA: Insufficient documentation

## 2016-03-12 DIAGNOSIS — I10 Essential (primary) hypertension: Secondary | ICD-10-CM | POA: Insufficient documentation

## 2016-03-12 DIAGNOSIS — Z9889 Other specified postprocedural states: Secondary | ICD-10-CM | POA: Diagnosis not present

## 2016-03-12 DIAGNOSIS — Z79899 Other long term (current) drug therapy: Secondary | ICD-10-CM | POA: Insufficient documentation

## 2016-03-13 NOTE — Progress Notes (Signed)
Patient ID: Megan Hester, female   DOB: 1930/03/25, 83 y.o.   MRN: [id number]      Primary Care Physician: Geoffery Lyons, MD Referring Physician: Dr. Gean Birchwood is a 83 y.o. female that was initially seen  in the afib clinic, 11/16, for f/u with h/o of persistent afib after being started on flecainide 100 mg bid resulting in  SR . She feels well. Will feel an occasional flutter. On Xarelto without bleeding issues.   F/u 01/17/16 and is staying in SR but has noticed weak spells for about a week, feels woozy for a couple of seconds. HR 57 bpm, with first degree AV block but PR int is actually shorter than on previous visits. Has had no change in medical condition or meds. Is pleased that she has not had any afib.. 48 hour monitor placed and diltiazem dose was decreased.  F/u 02/12/16 for review after 48 hour monitor results being received. It does show SR with 3-7 sec pauses mostly at night. She states that the woozy spells were worse than usual when wearing the monitor and getting up at night to use the bathroom. Not as symptomatic during the day.  Pt is here for f/u for PPM, 1/29, for SSS, . Her Cardizem  Dose was decreased and flecainide was stopped when it was discovered she was having pauses and now she is having issues with afib with rvr. She is having to use prn Cardizem a lot but still not with good results. BP has been on low side and lisinopril/ace on hold. She may need to be reloaded on flecainide. Pacer site well healed and has been seen in device clinic with interrogation 03/06/16.  Returns 12/31 and now with rate control. She has felt better. Discussed reloading on flecainide but she is hesitant to do so.Heart rates at home have been in the 70/80's but slightly higher here due to the rush to get her husband and herself ready to be here.  Today, she denies symptoms of palpitations, chest pain, shortness of breath, orthopnea, PND, lower extremity edema, dizziness,  presyncope, syncope, or neurologic sequela. The patient is tolerating medications without difficulties and is otherwise without complaint today.   Past Medical History:  Diagnosis Date  . Arthritis    "all over my body" (02/25/2016)  . 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 GERD (gastroesophageal reflux disease)    hx; "when I was working"  . Hypertension   . Macular degeneration   . Paroxysmal atrial fibrillation (HCC)   . Presence of permanent cardiac pacemaker   . 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 BREAST BIOPSY Right   . BREAST LUMPECTOMY Right 1999   RADIATION  . CARDIAC CATHETERIZATION  02/05/1999   HYPERDYNAMIC LEFT VENTRICLE WITH EF OF 75-80%  . CARDIOVERSION N/A 01/22/2015   Procedure: CARDIOVERSION;  Surgeon: [name], MD;  Location: [facility] ENDOSCOPY;  Service: Cardiovascular;  Laterality: N/A;  . CARDIOVERSION N/A 02/22/2015   Procedure: CARDIOVERSION;  Surgeon: [name], MD;  Location: [facility] ENDOSCOPY;  Service: Cardiovascular;  Laterality: N/A;  . DILATION AND CURETTAGE OF UTERUS    . ENUCLEATION Right 2012  . EP IMPLANTABLE DEVICE N/A 02/25/2016   Procedure: Pacemaker Implant;  Surgeon: [name], MD;  Location: [place] CV LAB;  Service: Cardiovascular;  Laterality: N/A;  . INSERT / REPLACE / REMOVE PACEMAKER    .  TONSILLECTOMY      Current Outpatient Prescriptions  Medication Sig Dispense Refill  . acetaminophen (TYLENOL) 650 MG CR tablet Take 650 mg by mouth daily as needed for pain.    . calcium carbonate (TUMS - DOSED IN MG ELEMENTAL CALCIUM) 500 MG chewable tablet Chew 1-2 tablets by mouth daily as needed for indigestion or heartburn.    . Cholecalciferol (VITAMIN D) 1000 UNITS capsule Take 1,000 Units by mouth daily.      . clobetasol cream (TEMOVATE) 0.05 % USE NIGHTLY FOR 1 MONTH (Patient taking differently: Take 1 application as needed for irritation and  itching) 60 g 0  . diltiazem (CARDIZEM CD) 120 MG 24 hr capsule Take 1 capsule (120 mg total) by mouth 2 (two) times daily. 60 capsule 3  . diltiazem (CARDIZEM) 30 MG tablet Take one tablet by mouth every 4 hours AS NEEDED for A-fib HR > 100 as long as BP > 100. 45 tablet 1  . docusate sodium (COLACE) 100 MG capsule Take 300 mg by mouth at bedtime.     . fexofenadine (ALLEGRA) 180 MG tablet Take 180 mg by mouth daily.    . fluticasone (FLONASE) 50 MCG/ACT nasal spray Place 1 spray into both nostrils at bedtime.     . Hypromellose (ARTIFICIAL TEARS OP) Place 1 drop into the left eye 3 (three) times daily.    [name] Kitchen lisinopril-hydrochlorothiazide (PRINZIDE,ZESTORETIC) 20-12.5 MG tablet Take 0.5 tablets by mouth daily.    . Multiple Vitamins-Minerals (CENTRUM SILVER PO) Take 1 capsule by mouth daily.     . Multiple Vitamins-Minerals (PRESERVISION/LUTEIN PO) Take 1 capsule by mouth 2 (two) times daily.     [name] Kitchen neomycin-polymyxin-dexameth (MAXITROL) 0.1 % OINT Place 1 application into the right eye at bedtime.    Vladimir Faster Glycol-Propyl Glycol (SYSTANE OP) Place 1 drop into the right eye 3 (three) times daily.    . rivaroxaban (XARELTO) 20 MG TABS tablet Take 1 tablet (20 mg total) by mouth daily with supper.    . triamcinolone cream (KENALOG) 0.1 % Apply 1 application topically daily as needed (rash).     No current facility-administered medications for this encounter.     Allergies  Allergen Reactions  . Shellfish Allergy Hives and Swelling    Shrimp and lobster only  . Atropine     Causes AFIB  . Ceclor [Cefaclor] Hives  . Codeine     Leg pain  . Contrast Media [Iodinated Diagnostic Agents] Hives  . Doxycycline     unknown  . Fenoprofen Calcium Swelling  . Isopto Hyoscine [Scopolamine]     Causes AFIB  . Levaquin [Levofloxacin]     Rash & causes AFIB  . Other     Other reaction(s): Bleeding (intolerance) Nutrasweet  Naphon - swelling  . Oxycodone     Depression - pt tolerates as  needed   . Penicillins     Has patient had a PCN reaction causing immediate rash, facial/tongue/throat swelling, SOB or lightheadedness with hypotension: unknown Has patient had a PCN reaction causing severe rash involving mucus membranes or skin necrosis: unknown Has patient had a PCN reaction that required hospitalization unknown Has patient had a PCN reaction occurring within the last 10 years:no If all of the above answers are "NO", then may proceed with Cephalosporin use. unknown  . Sulfa Drugs Cross Reactors     Ankle swelling   . Warfarin     Other reaction(s): Bleeding (intolerance) From eyes  . Tape Rash  Use paper tape    Social History   Social History  . Marital status: Married    Spouse name: N/A  . Number of children: 3  . Years of education: N/A   Occupational History  .  Retired   Social History Main Topics  . Smoking status: Never Smoker  . Smokeless tobacco: Never Used  . Alcohol use 0.0 oz/week     Comment: 02/25/2016 "glass of wine/month"  . Drug use: No  . Sexual activity: Yes    Birth control/ protection: Post-menopausal     Comment: 1st intercourse 59 yo-1 partner   Other Topics Concern  . 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    Family History  Problem Relation Age of Onset  . Hypertension Father   . Heart attack Father   . Parkinsonism Father   . Diabetes Brother   . Heart disease Brother   . Lung cancer Brother   . Stroke Mother     ROS- All systems are reviewed and negative except as per the HPI above  Physical Exam: Vitals:   03/12/16 1433  BP: 134/64  Pulse: (!) 106  Weight: 173 lb 12.8 oz (78.8 kg)  Height: 5\' 4"  (1.626 m)    GEN- The patient is well appearing, alert and oriented x 3 today.   Head- normocephalic, atraumatic Eyes-  Sclera clear, conjunctiva pink Ears- hearing intact Oropharynx- clear Neck- supple, no JVP Lymph- no cervical lymphadenopathy Lungs-  Clear to ausculation bilaterally, normal work of breathing Heart- irregular rate and rhythm, no murmurs, rubs or gallops, PMI not laterally displaced. PPM site healed GI- soft, NT, ND, + BS Extremities- no clubbing, cyanosis, or edema MS- no significant deformity or atrophy Skin- no rash or lesion Psych- euthymic mood, full affect Neuro- strength and sensation are intact  EKG- afib with rvr at 106 bpm, qrs int 92 ms, qtc 494 ms Epic records reviewed   Assessment and Plan: 1. Persistent afib S/p PPM,02/25/16, for SSS Now with afib with rvr Rate improved with increase of cardizem to 120 mg bid  Discussed reloading of flecainide and DCCV to restore SR Pt is hesitate to do so and would like to discuss with Dr. Rayann Heman Continue to hold lis/hctz Continue xarelto  F/u afib clinic on  With Dr. Rayann Heman in one week  Geroge Baseman. Ashlyn Cabler, [facility] [address] [place], [place] [address] [phone]

## 2016-03-17 ENCOUNTER — Ambulatory Visit
Admission: RE | Admit: 2016-03-17 | Discharge: 2016-03-17 | Disposition: A | Discharge status: Home / Self Care | Payer: Medicare Other | Source: Ambulatory Visit | Attending: Registered Nurse | Admitting: Registered Nurse

## 2016-03-17 DIAGNOSIS — R1011 Right upper quadrant pain: Secondary | ICD-10-CM

## 2016-03-20 ENCOUNTER — Ambulatory Visit (HOSPITAL_COMMUNITY)
Admission: RE | Admit: 2016-03-20 | Discharge: 2016-03-20 | Disposition: A | Discharge status: Home / Self Care | Payer: Medicare Other | Source: Ambulatory Visit | Attending: Nurse Practitioner | Admitting: Nurse Practitioner

## 2016-03-20 ENCOUNTER — Encounter (HOSPITAL_COMMUNITY): Payer: Self-pay | Admitting: Nurse Practitioner

## 2016-03-20 VITALS — BP 110/72 | HR 72 | Ht 64.0 in | Wt 173.0 lb

## 2016-03-20 DIAGNOSIS — Z950 Presence of cardiac pacemaker: Secondary | ICD-10-CM | POA: Diagnosis not present

## 2016-03-20 DIAGNOSIS — K219 Gastro-esophageal reflux disease without esophagitis: Secondary | ICD-10-CM | POA: Insufficient documentation

## 2016-03-20 DIAGNOSIS — Z853 Personal history of malignant neoplasm of breast: Secondary | ICD-10-CM | POA: Insufficient documentation

## 2016-03-20 DIAGNOSIS — Z7901 Long term (current) use of anticoagulants: Secondary | ICD-10-CM | POA: Insufficient documentation

## 2016-03-20 DIAGNOSIS — I10 Essential (primary) hypertension: Secondary | ICD-10-CM | POA: Insufficient documentation

## 2016-03-20 DIAGNOSIS — Z9889 Other specified postprocedural states: Secondary | ICD-10-CM | POA: Diagnosis not present

## 2016-03-20 DIAGNOSIS — I480 Paroxysmal atrial fibrillation: Secondary | ICD-10-CM | POA: Diagnosis present

## 2016-03-20 DIAGNOSIS — H3530 Unspecified macular degeneration: Secondary | ICD-10-CM | POA: Insufficient documentation

## 2016-03-20 DIAGNOSIS — Z79899 Other long term (current) drug therapy: Secondary | ICD-10-CM | POA: Insufficient documentation

## 2016-03-20 DIAGNOSIS — I481 Persistent atrial fibrillation: Secondary | ICD-10-CM | POA: Diagnosis not present

## 2016-03-20 DIAGNOSIS — I4819 Other persistent atrial fibrillation: Secondary | ICD-10-CM

## 2016-03-20 MED ORDER — FLECAINIDE ACETATE 50 MG PO TABS: 50.0000 mg | ORAL_TABLET | Freq: Two times a day (BID) | ORAL | 3 refills | Status: DC

## 2016-03-20 NOTE — Progress Notes (Deleted)
PCP: Geoffery Lyons, MD  Megan Hester is a 83 y.o. female with a h/o atrial fibrillation who presents today for follow-up.  She has begun having episodes of dizziness and presyncope over the past month.  She was evaluated by Butch Penny in the AF clinic and found to have sick sinus syndrome with pauses of up to 7 seconds on holter monitor.  She also has afib with elevated V rates for which she requires cardizem.  Today, she denies symptoms of orthopnea, PND, lower extremity edema,  syncope, or neurologic sequela.  She is tolerating xarelto without significant bleeding.  Past Medical History:  Diagnosis Date  . Arthritis    "all over my body" (02/25/2016)  . 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 GERD (gastroesophageal reflux disease)    hx; "when I was working"  . Hypertension   . Macular degeneration   . Paroxysmal atrial fibrillation (HCC)   . Presence of permanent cardiac pacemaker   . 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 BREAST BIOPSY Right   . BREAST LUMPECTOMY Right 1999   RADIATION  . CARDIAC CATHETERIZATION  02/05/1999   HYPERDYNAMIC LEFT VENTRICLE WITH EF OF 75-80%  . CARDIOVERSION N/A 01/22/2015   Procedure: CARDIOVERSION;  Surgeon: [name], MD;  Location: [facility] ENDOSCOPY;  Service: Cardiovascular;  Laterality: N/A;  . CARDIOVERSION N/A 02/22/2015   Procedure: CARDIOVERSION;  Surgeon: [name], MD;  Location: [facility] ENDOSCOPY;  Service: Cardiovascular;  Laterality: N/A;  . DILATION AND CURETTAGE OF UTERUS    . ENUCLEATION Right 2012  . EP IMPLANTABLE DEVICE N/A 02/25/2016   Procedure: Pacemaker Implant;  Surgeon: [name], MD;  Location: [place] CV LAB;  Service: Cardiovascular;  Laterality: N/A;  . INSERT / REPLACE / REMOVE PACEMAKER    . TONSILLECTOMY      Current Outpatient Prescriptions  Medication Sig Dispense Refill  . acetaminophen (TYLENOL) 650 MG CR tablet Take 650  mg by mouth daily as needed for pain.    . calcium carbonate (TUMS - DOSED IN MG ELEMENTAL CALCIUM) 500 MG chewable tablet Chew 1-2 tablets by mouth daily as needed for indigestion or heartburn.    . Cholecalciferol (VITAMIN D) 1000 UNITS capsule Take 1,000 Units by mouth daily.      . clobetasol cream (TEMOVATE) 0.05 % USE NIGHTLY FOR 1 MONTH (Patient taking differently: Take 1 application as needed for irritation and itching) 60 g 0  . diltiazem (CARDIZEM CD) 120 MG 24 hr capsule Take 1 capsule (120 mg total) by mouth 2 (two) times daily. 60 capsule 3  . docusate sodium (COLACE) 100 MG capsule Take 300 mg by mouth at bedtime.     . fexofenadine (ALLEGRA) 180 MG tablet Take 180 mg by mouth daily.    . fluticasone (FLONASE) 50 MCG/ACT nasal spray Place 1 spray into both nostrils at bedtime.     . Hypromellose (ARTIFICIAL TEARS OP) Place 1 drop into the left eye 3 (three) times daily.    [name] Kitchen lisinopril-hydrochlorothiazide (PRINZIDE,ZESTORETIC) 20-12.5 MG tablet Take 0.5 tablets by mouth daily.    . Multiple Vitamins-Minerals (CENTRUM SILVER PO) Take 1 capsule by mouth daily.     . Multiple Vitamins-Minerals (PRESERVISION/LUTEIN PO) Take 1 capsule by mouth 2 (two) times daily.     [name] Kitchen neomycin-polymyxin-dexameth (MAXITROL) 0.1 % OINT Place 1 application into the right eye at bedtime.    [name] Kitchen  Polyethyl Glycol-Propyl Glycol (SYSTANE OP) Place 1 drop into the right eye 3 (three) times daily.    . rivaroxaban (XARELTO) 20 MG TABS tablet Take 1 tablet (20 mg total) by mouth daily with supper.    . triamcinolone cream (KENALOG) 0.1 % Apply 1 application topically daily as needed (rash).    [name] Kitchen diltiazem (CARDIZEM) 30 MG tablet Take one tablet by mouth every 4 hours AS NEEDED for A-fib HR > 100 as long as BP > 100. (Patient not taking: Reported on 03/20/2016) 45 tablet 1   No current facility-administered medications for this encounter.     Allergies  Allergen Reactions  . Shellfish Allergy Hives and Swelling     Shrimp and lobster only  . Atropine     Causes AFIB  . Ceclor [Cefaclor] Hives  . Codeine     Leg pain  . Contrast Media [Iodinated Diagnostic Agents] Hives  . Doxycycline     unknown  . Fenoprofen Calcium Swelling  . Isopto Hyoscine [Scopolamine]     Causes AFIB  . Levaquin [Levofloxacin]     Rash & causes AFIB  . Other     Other reaction(s): Bleeding (intolerance) Nutrasweet  Naphon - swelling  . Oxycodone     Depression - pt tolerates as needed   . Penicillins     Has patient had a PCN reaction causing immediate rash, facial/tongue/throat swelling, SOB or lightheadedness with hypotension: unknown Has patient had a PCN reaction causing severe rash involving mucus membranes or skin necrosis: unknown Has patient had a PCN reaction that required hospitalization unknown Has patient had a PCN reaction occurring within the last 10 years:no If all of the above answers are "NO", then may proceed with Cephalosporin use. unknown  . Sulfa Drugs Cross Reactors     Ankle swelling   . Warfarin     Other reaction(s): Bleeding (intolerance) From eyes  . Tape Rash    Use paper tape    Social History   Social History  . Marital status: Married    Spouse name: N/A  . Number of children: 3  . Years of education: N/A   Occupational History  .  Retired   Social History Main Topics  . Smoking status: Never Smoker  . Smokeless tobacco: Never Used  . Alcohol use 0.0 oz/week     Comment: 02/25/2016 "glass of wine/month"  . Drug use: No  . Sexual activity: Yes    Birth control/ protection: Post-menopausal     Comment: 1st intercourse 89 yo-1 partner   Other Topics Concern  . 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    Family History  Problem Relation Age of Onset  . Hypertension Father   . Heart attack Father   . Parkinsonism Father   . Diabetes Brother   . Heart disease Brother   . Lung cancer Brother   . Stroke Mother     ROS-   All other systems are reviewed and negative except as per HPI above  Physical Exam: Vitals:   03/20/16 1444  BP: 110/72  BP Location: Left Arm  Patient Position: Sitting  Cuff Size: Normal  Pulse: 72  Weight: 173 lb (78.5 kg)  Height: 5\' 4"  (1.626 m)    GEN- The patient is well appearing, alert and oriented x 3 today.   Head- normocephalic, atraumatic Eyes-  Prosthetic R eye Ears- hearing intact Oropharynx- clear Neck- supple, no JVP Lungs-  Clear to ausculation bilaterally, normal work of breathing Heart- tachycardic irregular rhythm GI- soft, NT, ND, + BS Extremities- no clubbing, cyanosis, or edema Neuro- strength and sensation are intact  ekg today reveals sinus rhythm 72 bpm, PR 200 msec, incomplete RBBB  Assessment and Plan:  1. Persistent atrial fibrillation/ tachy/brady syndrome/ sick sinus syndrome She has been having pauses of up to 7 seconds with presyncope She requires cardizem for rate control.  I would therefore recommend pacemaker implantation at this time.  Risks, benefits, alternatives to pacemaker implantation were discussed in detail with the patient today. The patient understands that the risks include but are not limited to bleeding, infection, pneumothorax, perforation, tamponade, vascular damage, renal failure, MI, stroke, death,  and lead dislodgement and wishes to proceed. We will therefore schedule the procedure at the next available time.  Will use MDT PPM for MRI compatibility as well as atrial therapies. Hold xarelto 48 hours prior to PPM  2. HTN Stable No change required today   This is a complex patient with a high level of decision making required.     [name] MD, [facility] 03/20/2016 3:21 PM

## 2016-03-20 NOTE — Patient Instructions (Signed)
Your physician has recommended you make the following change in your medication:  1)Start Flecainide 50mg  twice a day  Megan Hester will call you to schedule appointment with Megan Hester in 4 weeks.

## 2016-03-20 NOTE — Progress Notes (Signed)
PCP: Geoffery Lyons, MD  Megan Hester is a 83 y.o. female who presents today for urgent AF followup.  She has been having more afib since stopping flecainide.  She is in sinus now and feeling "better"  Today, she denies symptoms of palpitations, chest pain, shortness of breath,  lower extremity edema, dizziness, presyncope, or syncope.  The patient is otherwise without complaint today.   Past Medical History:  Diagnosis Date  . Arthritis    "all over my body" (02/25/2016)  . 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 GERD (gastroesophageal reflux disease)    hx; "when I was working"  . Hypertension   . Macular degeneration   . Paroxysmal atrial fibrillation (HCC)   . Presence of permanent cardiac pacemaker   . 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 BREAST BIOPSY Right   . BREAST LUMPECTOMY Right 1999   RADIATION  . CARDIAC CATHETERIZATION  02/05/1999   HYPERDYNAMIC LEFT VENTRICLE WITH EF OF 75-80%  . CARDIOVERSION N/A 01/22/2015   Procedure: CARDIOVERSION;  Surgeon: [name], MD;  Location: [facility] ENDOSCOPY;  Service: Cardiovascular;  Laterality: N/A;  . CARDIOVERSION N/A 02/22/2015   Procedure: CARDIOVERSION;  Surgeon: [name], MD;  Location: [facility] ENDOSCOPY;  Service: Cardiovascular;  Laterality: N/A;  . DILATION AND CURETTAGE OF UTERUS    . ENUCLEATION Right 2012  . EP IMPLANTABLE DEVICE N/A 02/25/2016   Procedure: Pacemaker Implant;  Surgeon: [name], MD;  Location: [place] CV LAB;  Service: Cardiovascular;  Laterality: N/A;  . INSERT / REPLACE / REMOVE PACEMAKER    . TONSILLECTOMY      ROS- all systems are reviewed and negative except as per HPI above  Current Outpatient Prescriptions  Medication Sig Dispense Refill  . acetaminophen (TYLENOL) 650 MG CR tablet Take 650 mg by mouth daily as needed for pain.    . calcium carbonate (TUMS - DOSED IN MG ELEMENTAL CALCIUM) 500 MG  chewable tablet Chew 1-2 tablets by mouth daily as needed for indigestion or heartburn.    . Cholecalciferol (VITAMIN D) 1000 UNITS capsule Take 1,000 Units by mouth daily.      . clobetasol cream (TEMOVATE) 0.05 % USE NIGHTLY FOR 1 MONTH (Patient taking differently: Take 1 application as needed for irritation and itching) 60 g 0  . diltiazem (CARDIZEM CD) 120 MG 24 hr capsule Take 1 capsule (120 mg total) by mouth 2 (two) times daily. 60 capsule 3  . docusate sodium (COLACE) 100 MG capsule Take 300 mg by mouth at bedtime.     . fexofenadine (ALLEGRA) 180 MG tablet Take 180 mg by mouth daily.    . fluticasone (FLONASE) 50 MCG/ACT nasal spray Place 1 spray into both nostrils at bedtime.     . Hypromellose (ARTIFICIAL TEARS OP) Place 1 drop into the left eye 3 (three) times daily.    [name] Kitchen lisinopril-hydrochlorothiazide (PRINZIDE,ZESTORETIC) 20-12.5 MG tablet Take 0.5 tablets by mouth daily.    . Multiple Vitamins-Minerals (CENTRUM SILVER PO) Take 1 capsule by mouth daily.     . Multiple Vitamins-Minerals (PRESERVISION/LUTEIN PO) Take 1 capsule by mouth 2 (two) times daily.     [name] Kitchen neomycin-polymyxin-dexameth (MAXITROL) 0.1 % OINT Place 1 application into the right eye at bedtime.    Vladimir Faster Glycol-Propyl Glycol (SYSTANE OP) Place 1 drop into the right eye 3 (three) times daily.    . rivaroxaban (  XARELTO) 20 MG TABS tablet Take 1 tablet (20 mg total) by mouth daily with supper.    . triamcinolone cream (KENALOG) 0.1 % Apply 1 application topically daily as needed (rash).    [name] Kitchen diltiazem (CARDIZEM) 30 MG tablet Take one tablet by mouth every 4 hours AS NEEDED for A-fib HR > 100 as long as BP > 100. (Patient not taking: Reported on 03/20/2016) 45 tablet 1   No current facility-administered medications for this encounter.     Physical Exam: Vitals:   03/20/16 1444  BP: 110/72  Pulse: 72  Weight: 173 lb (78.5 kg)  Height: 5\' 4"  (1.626 m)    GEN- The patient is well appearing, alert and oriented x 3  today.   Head- normocephalic, atraumatic Eyes-  Sclera clear, conjunctiva pink Ears- hearing intact Oropharynx- clear Lungs- Clear to ausculation bilaterally, normal work of breathing Chest- pacemaker pocket is well healed Heart- Regular rate and rhythm, no murmurs, rubs or gallops, PMI not laterally displaced GI- soft, NT, ND, + BS Extremities- no clubbing, cyanosis, or edema  ekg today reveals sinus rhythm 72 bpm, PR 146 msec, Qtc 457 msec, poor R wave progression, rSr1  Assessment and Plan:  1. afib In sinus today but has had significant troubles recently with afib Will restart flecainide Risks of medicine discussed with patient who wishes to proceed Continue xarelto  2. PPM Pocket has healed Device not checked today  3. HTN Stable No change required today  Follow-up with me in 4 weeks for AF management  [name] MD, [facility] 03/20/2016 3:29 PM

## 2016-04-03 DIAGNOSIS — Z961 Presence of intraocular lens: Secondary | ICD-10-CM | POA: Insufficient documentation

## 2016-04-11 ENCOUNTER — Encounter: Payer: Self-pay | Admitting: Internal Medicine

## 2016-04-21 ENCOUNTER — Encounter: Payer: Medicare Other | Admitting: Internal Medicine

## 2016-04-21 ENCOUNTER — Other Ambulatory Visit: Payer: Self-pay

## 2016-04-23 ENCOUNTER — Ambulatory Visit (INDEPENDENT_AMBULATORY_CARE_PROVIDER_SITE_OTHER): Payer: Medicare Other | Admitting: Internal Medicine

## 2016-04-23 VITALS — BP 130/70 | HR 63 | Ht 64.0 in | Wt 175.4 lb

## 2016-04-23 DIAGNOSIS — I480 Paroxysmal atrial fibrillation: Secondary | ICD-10-CM | POA: Diagnosis not present

## 2016-04-23 DIAGNOSIS — I495 Sick sinus syndrome: Secondary | ICD-10-CM

## 2016-04-23 LAB — CUP PACEART INCLINIC DEVICE CHECK
Battery Remaining Longevity: 133 mo
Battery Voltage: 3.06 V
Brady Statistic AP VP Percent: 0.12 %
Brady Statistic AP VS Percent: 4 %
Brady Statistic RV Percent Paced: 0.32 %
Implantable Lead Implant Date: 20180115
Implantable Lead Model: 5076
Implantable Lead Model: 5076
Implantable Pulse Generator Implant Date: 20180115
Lead Channel Impedance Value: 570 Ohm
Lead Channel Impedance Value: 646 Ohm
Lead Channel Sensing Intrinsic Amplitude: 1.875 mV
Lead Channel Sensing Intrinsic Amplitude: 12.5 mV
Lead Channel Setting Pacing Amplitude: 2 V
Lead Channel Setting Pacing Amplitude: 2.5 V
Lead Channel Setting Sensing Sensitivity: 2 mV
MDC IDC LEAD IMPLANT DT: 20180115
MDC IDC LEAD LOCATION: 753859
MDC IDC LEAD LOCATION: 753860
MDC IDC MSMT LEADCHNL RA IMPEDANCE VALUE: 380 Ohm
MDC IDC MSMT LEADCHNL RA IMPEDANCE VALUE: 494 Ohm
MDC IDC MSMT LEADCHNL RA PACING THRESHOLD AMPLITUDE: 0.75 V
MDC IDC MSMT LEADCHNL RA PACING THRESHOLD PULSEWIDTH: 0.4 ms
MDC IDC MSMT LEADCHNL RA SENSING INTR AMPL: 3.75 mV
MDC IDC MSMT LEADCHNL RV PACING THRESHOLD AMPLITUDE: 0.75 V
MDC IDC MSMT LEADCHNL RV PACING THRESHOLD PULSEWIDTH: 0.4 ms
MDC IDC MSMT LEADCHNL RV SENSING INTR AMPL: 11.75 mV
MDC IDC SESS DTM: 20180314152416
MDC IDC SET LEADCHNL RV PACING PULSEWIDTH: 0.4 ms
MDC IDC STAT BRADY AS VP PERCENT: 0.23 %
MDC IDC STAT BRADY AS VS PERCENT: 95.66 %
MDC IDC STAT BRADY RA PERCENT PACED: 3.7 %

## 2016-04-23 MED ORDER — FLECAINIDE ACETATE 100 MG PO TABS: 50.0000 mg | ORAL_TABLET | Freq: Two times a day (BID) | ORAL | 3 refills | Status: DC

## 2016-04-23 MED ORDER — FLECAINIDE ACETATE 100 MG PO TABS: 100.0000 mg | ORAL_TABLET | Freq: Two times a day (BID) | ORAL | 3 refills | Status: DC

## 2016-04-23 NOTE — Progress Notes (Signed)
PCP: Geoffery Lyons, MD  Megan Hester is a 83 y.o. female who presents today for AF followup.  She has done better with low dose flecainide but continues to have occasional afib.  Today, she denies symptoms of palpitations, chest pain, shortness of breath,  lower extremity edema, dizziness, presyncope, or syncope.  The patient is otherwise without complaint today.   Past Medical History:  Diagnosis Date  . Arthritis    "all over my body" (02/25/2016)  . 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 GERD (gastroesophageal reflux disease)    hx; "when I was working"  . Hypertension   . Macular degeneration   . Paroxysmal atrial fibrillation (HCC)   . Presence of permanent cardiac pacemaker   . 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 BREAST BIOPSY Right   . BREAST LUMPECTOMY Right 1999   RADIATION  . CARDIAC CATHETERIZATION  02/05/1999   HYPERDYNAMIC LEFT VENTRICLE WITH EF OF 75-80%  . CARDIOVERSION N/A 01/22/2015   Procedure: CARDIOVERSION;  Surgeon: [name], MD;  Location[facility] END[facility]COPY;  Service: Cardiovascular;  Laterality: N/A;  . CARDIOVERSION N/A 02/22/2015   Procedure: CARDIOVERSION;  Surgeon: [name], MD;  Location: [facility] ENDOSCOPY;  Service: Cardiovascular;  Laterality: N/A;  . DILATION AND CURETTAGE OF UTERUS    . ENUCLEATION Right 2012  . EP IMPLANTABLE DEVICE N/A 02/25/2016   Procedure: Pacemaker Implant;  Surgeon: [name], MD;  Location: [place] CV LAB;  Service: Cardiovascular;  Laterality: N/A;  . INSERT / REPLACE / REMOVE PACEMAKER    . TONSILLECTOMY      ROS- all systems are reviewed and negative except as per HPI above  Current Outpatient Prescriptions  Medication Sig Dispense Refill  . acetaminophen (TYLENOL) 650 MG CR tablet Take 650 mg by mouth daily as needed for pain.    . calcium carbonate (TUMS - DOSED IN MG ELEMENTAL CALCIUM) 500 MG chewable tablet Chew  1-2 tablets by mouth daily as needed for indigestion or heartburn.    . Cholecalciferol (VITAMIN D) 1000 UNITS capsule Take 1,000 Units by mouth daily.      [name] Kitchen diltiazem (CARDIZEM CD) 120 MG 24 hr capsule Take 1 capsule (120 mg total) by mouth 2 (two) times daily. 60 capsule 3  . docusate sodium (COLACE) 100 MG capsule Take 300 mg by mouth at bedtime.     . fexofenadine (ALLEGRA) 180 MG tablet Take 180 mg by mouth daily.    . flecainide (TAMBOCOR) 50 MG tablet Take 1 tablet (50 mg total) by mouth 2 (two) times daily. 60 tablet 3  . fluticasone (FLONASE) 50 MCG/ACT nasal spray Place 1 spray into both nostrils at bedtime.     . Hypromellose (ARTIFICIAL TEARS OP) Place 1 drop into the left eye 3 (three) times daily.    [name] Kitchen lisinopril-hydrochlorothiazide (PRINZIDE,ZESTORETIC) 20-12.5 MG tablet Take 0.5 tablets by mouth daily.    . Multiple Vitamins-Minerals (CENTRUM SILVER PO) Take 1 capsule by mouth daily.     . Multiple Vitamins-Minerals (PRESERVISION/LUTEIN PO) Take 1 capsule by mouth 2 (two) times daily.     [name] Kitchen neomycin-polymyxin-dexameth (MAXITROL) 0.1 % OINT Place 1 application into the right eye at bedtime.    Vladimir Faster Glycol-Propyl Glycol (SYSTANE OP) Place 1 drop into the right eye 3 (three) times daily.    . rivaroxaban (XARELTO) 20 MG TABS tablet Take 1 tablet (20 mg  total) by mouth daily with supper.    . triamcinolone cream (KENALOG) 0.1 % Apply 1 application topically daily as needed (rash).     No current facility-administered medications for this visit.     Physical Exam: Vitals:   04/23/16 1441  BP: 130/70  Pulse: 63  SpO2: 98%  Weight: 175 lb 6 oz (79.5 kg)  Height: 5\' 4"  (1.626 m)    GEN- The patient is well appearing, alert and oriented x 3 today.   Head- normocephalic, atraumatic Eyes-  Sclera clear, conjunctiva pink Ears- hearing intact Oropharynx- clear Lungs- Clear to ausculation bilaterally, normal work of breathing Chest- pacemaker pocket is well healed Heart-  Regular rate and rhythm, no murmurs, rubs or gallops, PMI not laterally displaced GI- soft, NT, ND, + BS Extremities- no clubbing, cyanosis, or edema  ekg today reveals sinus rhythm 63 bpm, PR 218 msec, Qtc 444 msec, poor R wave progression, rSr1  Assessment and Plan:  1. afib AF burden is 12.6% Will increase flecainide to 100mg  BID Risks of medicine discussed again with patient who wishes to proceed Continue xarelto  2. PPM Pocket has healed Normal device function Unfortunately, she is having trouble locating her Barr[employer] which was given to her at discharge.  She does not have smart phone.  The APP is on her non English speaking relatives phone which is difficult.  She may require a standard Carelink transmitter but will try to figure it out before next visit.  Terrence Dupont gave the patient her card today for contact follow-up  3. HTN Stable No change required today  Follow-up with me in 4 weeks  [name] MD, [facility] 04/23/2016 3:10 PM

## 2016-04-23 NOTE — Patient Instructions (Addendum)
Medication Instructions:   Your physician has recommended you make the following change in your medication:  1) Increase Flecainide to 100 mg twice daily   Labwork: None ordered   Testing/Procedures: None ordered   Follow-Up: Your physician recommends that you schedule a follow-up appointment as scheduled   Any Other Special Instructions Will Be Listed Below (If Applicable).     If you need a refill on your cardiac medications before your next appointment, please call your pharmacy.

## 2016-05-20 ENCOUNTER — Telehealth: Payer: Self-pay | Admitting: Radiology

## 2016-05-20 ENCOUNTER — Other Ambulatory Visit: Payer: Self-pay | Admitting: Internal Medicine

## 2016-05-20 DIAGNOSIS — R1013 Epigastric pain: Secondary | ICD-10-CM

## 2016-05-20 NOTE — Telephone Encounter (Signed)
Called pt and explained 13 hour prep. Pt will pick up at [facility] later today.  13 hour prep called to [employer].

## 2016-05-21 ENCOUNTER — Ambulatory Visit
Admission: RE | Admit: 2016-05-21 | Discharge: 2016-05-21 | Disposition: A | Discharge status: Home / Self Care | Payer: Medicare Other | Source: Ambulatory Visit | Attending: Internal Medicine | Admitting: Internal Medicine

## 2016-05-21 DIAGNOSIS — R1013 Epigastric pain: Secondary | ICD-10-CM

## 2016-05-21 MED ORDER — IOPAMIDOL (ISOVUE-300) INJECTION 61%
100.0000 mL | Freq: Once | INTRAVENOUS | Status: AC | PRN
  Administered 2016-05-21: 100 mL via INTRAVENOUS

## 2016-05-26 ENCOUNTER — Encounter: Payer: Medicare Other | Admitting: Internal Medicine

## 2016-05-26 ENCOUNTER — Ambulatory Visit (INDEPENDENT_AMBULATORY_CARE_PROVIDER_SITE_OTHER): Payer: Medicare Other | Admitting: Internal Medicine

## 2016-05-26 VITALS — BP 132/72 | HR 89 | Ht 64.0 in | Wt 176.2 lb

## 2016-05-26 DIAGNOSIS — I495 Sick sinus syndrome: Secondary | ICD-10-CM | POA: Diagnosis not present

## 2016-05-26 DIAGNOSIS — I480 Paroxysmal atrial fibrillation: Secondary | ICD-10-CM

## 2016-05-26 DIAGNOSIS — I10 Essential (primary) hypertension: Secondary | ICD-10-CM | POA: Diagnosis not present

## 2016-05-26 LAB — CUP PACEART INCLINIC DEVICE CHECK
Brady Statistic AP VP Percent: 2.1 %
Brady Statistic AP VS Percent: 54.82 %
Brady Statistic AS VP Percent: 1.57 %
Brady Statistic AS VS Percent: 41.52 %
Brady Statistic RA Percent Paced: 46.89 %
Implantable Lead Implant Date: 20180115
Implantable Lead Location: 753860
Implantable Lead Model: 5076
Implantable Lead Model: 5076
Lead Channel Impedance Value: 361 Ohm
Lead Channel Impedance Value: 399 Ohm
Lead Channel Impedance Value: 570 Ohm
Lead Channel Pacing Threshold Amplitude: 0.75 V
Lead Channel Pacing Threshold Amplitude: 0.75 V
Lead Channel Pacing Threshold Pulse Width: 0.4 ms
Lead Channel Pacing Threshold Pulse Width: 0.4 ms
Lead Channel Sensing Intrinsic Amplitude: 11.375 mV
Lead Channel Sensing Intrinsic Amplitude: 16.875 mV
Lead Channel Setting Pacing Amplitude: 2 V
Lead Channel Setting Pacing Pulse Width: 0.4 ms
MDC IDC LEAD IMPLANT DT: 20180115
MDC IDC LEAD LOCATION: 753859
MDC IDC MSMT BATTERY REMAINING LONGEVITY: 129 mo
MDC IDC MSMT BATTERY VOLTAGE: 3.05 V
MDC IDC MSMT LEADCHNL RA SENSING INTR AMPL: 1.125 mV
MDC IDC MSMT LEADCHNL RA SENSING INTR AMPL: 1.375 mV
MDC IDC MSMT LEADCHNL RV IMPEDANCE VALUE: 646 Ohm
MDC IDC PG IMPLANT DT: 20180115
MDC IDC SESS DTM: 20180416164023
MDC IDC SET LEADCHNL RV PACING AMPLITUDE: 2.5 V
MDC IDC SET LEADCHNL RV SENSING SENSITIVITY: 2 mV
MDC IDC STAT BRADY RV PERCENT PACED: 2.98 %

## 2016-05-26 NOTE — Progress Notes (Signed)
PCP: Geoffery Lyons, MD  Megan Hester is a 83 y.o. female who presents today for AF followup.   She is mostly unaware of her afib recently.  Her primary concern is with abnormal pain for which primary care is evaluating. Today, she denies symptoms of palpitations, chest pain, shortness of breath,  lower extremity edema, dizziness, presyncope, or syncope.  The patient is otherwise without complaint today.   Past Medical History:  Diagnosis Date  . Arthritis    "all over my body" (02/25/2016)  . 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 GERD (gastroesophageal reflux disease)    hx; "when I was working"  . Hypertension   . Macular degeneration   . Paroxysmal atrial fibrillation (HCC)   . Presence of permanent cardiac pacemaker   . 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 BREAST BIOPSY Right   . BREAST LUMPECTOMY Right 1999   RADIATION  . CARDIAC CATHETERIZATION  02/05/1999   HYPERDYNAMIC LEFT VENTRICLE WITH EF OF 75-80%  . CARDIOVERSION N/A 01/22/2015   Procedure: CARDIOVERSION;  Surgeon: [name], MD;  Location: [facility] ENDOSCOPY;  Service: Cardiovascular;  Laterality: N/A;  . CARDIOVERSION N/A 02/22/2015   Procedure: CARDIOVERSION;  Surgeon: [name], MD;  Location: [facility] ENDOSCOPY;  Service: Cardiovascular;  Laterality: N/A;  . DILATION AND CURETTAGE OF UTERUS    . ENUCLEATION Right 2012  . EP IMPLANTABLE DEVICE N/A 02/25/2016   Procedure: Pacemaker Implant;  Surgeon: [name], MD;  Location: [place] CV LAB;  Service: Cardiovascular;  Laterality: N/A;  . INSERT / REPLACE / REMOVE PACEMAKER    . TONSILLECTOMY      ROS- all systems are reviewed and negative except as per HPI above  Current Outpatient Prescriptions  Medication Sig Dispense Refill  . acetaminophen (TYLENOL) 650 MG CR tablet Take 650 mg by mouth daily as needed for pain.    . calcium carbonate (TUMS - DOSED IN MG  ELEMENTAL CALCIUM) 500 MG chewable tablet Chew 1-2 tablets by mouth daily as needed for indigestion or heartburn.    . Cholecalciferol (VITAMIN D) 1000 UNITS capsule Take 1,000 Units by mouth daily.      [name] Kitchen diltiazem (CARDIZEM CD) 120 MG 24 hr capsule Take 1 capsule (120 mg total) by mouth 2 (two) times daily. 60 capsule 3  . docusate sodium (COLACE) 100 MG capsule Take 300 mg by mouth at bedtime.     . fexofenadine (ALLEGRA) 180 MG tablet Take 180 mg by mouth daily.    . flecainide (TAMBOCOR) 100 MG tablet Take 1 tablet (100 mg total) by mouth 2 (two) times daily. 180 tablet 3  . fluticasone (FLONASE) 50 MCG/ACT nasal spray Place 1 spray into both nostrils at bedtime.     . Hypromellose (ARTIFICIAL TEARS OP) Place 1 drop into the left eye 3 (three) times daily.    . Multiple Vitamins-Minerals (CENTRUM SILVER PO) Take 1 capsule by mouth daily.     . Multiple Vitamins-Minerals (PRESERVISION/LUTEIN PO) Take 1 capsule by mouth 2 (two) times daily.     [name] Kitchen neomycin-polymyxin-dexameth (MAXITROL) 0.1 % OINT Place 1 application into the right eye at bedtime.    Vladimir Faster Glycol-Propyl Glycol (SYSTANE OP) Place 1 drop into the right eye 3 (three) times daily.    . rivaroxaban (XARELTO) 20 MG TABS tablet Take 1 tablet (20 mg total) by mouth daily with supper.    [name] Kitchen  triamcinolone cream (KENALOG) 0.1 % Apply 1 application topically daily as needed (rash).     No current facility-administered medications for this visit.     Physical Exam: Vitals:   05/26/16 1544  BP: 132/72  Pulse: 89  SpO2: 99%  Weight: 176 lb 4 oz (79.9 kg)  Height: 5\' 4"  (1.626 m)    GEN- The patient is well appearing, alert and oriented x 3 today.   Head- normocephalic, atraumatic Eyes-  Sclera clear, conjunctiva pink Ears- hearing intact Oropharynx- clear Lungs- Clear to ausculation bilaterally, normal work of breathing Chest- pacemaker pocket is well healed Heart- irregular rate and rhythm  GI- soft, NT, ND, +  BS Extremities- no clubbing, cyanosis, or edema  ekg today reveals afib, V rate 89 bpm, incomplete RBBB, demand V pacing,  progression, rSr1  Assessment and Plan:  1. afib AF burden is 11 % (previously 12.6%) continue flecainide to 100mg  BID Continue xarelto  2. Sick sinus syndrome Normal pacemaker function See Pace Art report No changes today Unfortunately, she is having trouble locating her Barrister's clerk which was given to her at discharge.  She does not have smart phone.  The APP is on her non English speaking relatives phone which is difficult.  She may require a standard Carelink transmitter but will try to figure it out before next visit.    3. HTN Stable No change required today  Follow-up with Butch Penny in the AF clinic in 3 months I will see in 6 months  [name] MD, [facility] 05/26/2016 3:57 PM

## 2016-05-26 NOTE — Patient Instructions (Signed)
Medication Instructions:  Your physician recommends that you continue on your current medications as directed. Please refer to the Current Medication list given to you today.   Labwork: None ordered   Testing/Procedures: None ordered   Follow-Up:  Your physician recommends that you schedule a follow-up appointment in: 3 months with Donna Carroll, NP   Any Other Special Instructions Will Be Listed Below (If Applicable).     If you need a refill on your cardiac medications before your next appointment, please call your pharmacy.   

## 2016-06-17 ENCOUNTER — Other Ambulatory Visit: Payer: Self-pay | Admitting: Gynecology

## 2016-07-04 ENCOUNTER — Other Ambulatory Visit (HOSPITAL_COMMUNITY): Payer: Self-pay | Admitting: Nurse Practitioner

## 2016-08-19 ENCOUNTER — Ambulatory Visit (HOSPITAL_COMMUNITY)
Admission: RE | Admit: 2016-08-19 | Discharge: 2016-08-19 | Disposition: A | Discharge status: Home / Self Care | Payer: Medicare Other | Source: Ambulatory Visit | Attending: Nurse Practitioner | Admitting: Nurse Practitioner

## 2016-08-19 ENCOUNTER — Encounter (HOSPITAL_COMMUNITY): Payer: Self-pay | Admitting: Nurse Practitioner

## 2016-08-19 VITALS — BP 124/62 | HR 50 | Ht 64.0 in | Wt 170.0 lb

## 2016-08-19 DIAGNOSIS — M1990 Unspecified osteoarthritis, unspecified site: Secondary | ICD-10-CM | POA: Diagnosis not present

## 2016-08-19 DIAGNOSIS — I10 Essential (primary) hypertension: Secondary | ICD-10-CM | POA: Diagnosis not present

## 2016-08-19 DIAGNOSIS — K219 Gastro-esophageal reflux disease without esophagitis: Secondary | ICD-10-CM | POA: Insufficient documentation

## 2016-08-19 DIAGNOSIS — Z853 Personal history of malignant neoplasm of breast: Secondary | ICD-10-CM | POA: Insufficient documentation

## 2016-08-19 DIAGNOSIS — Z7901 Long term (current) use of anticoagulants: Secondary | ICD-10-CM | POA: Insufficient documentation

## 2016-08-19 DIAGNOSIS — I480 Paroxysmal atrial fibrillation: Secondary | ICD-10-CM | POA: Diagnosis present

## 2016-08-19 DIAGNOSIS — H3530 Unspecified macular degeneration: Secondary | ICD-10-CM | POA: Insufficient documentation

## 2016-08-19 DIAGNOSIS — I440 Atrioventricular block, first degree: Secondary | ICD-10-CM | POA: Diagnosis not present

## 2016-08-19 DIAGNOSIS — Z950 Presence of cardiac pacemaker: Secondary | ICD-10-CM | POA: Diagnosis not present

## 2016-08-19 NOTE — Progress Notes (Signed)
Patient ID: Megan Hester, female   DOB: [birth year], 83 y.o.   MRN: [id number]      Primary Care Physician: [name], MD Referring Physician: Dr. Gean Birchwood is a 83 y.o. female that was initially seen  in the afib clinic, 11/16, for f/u with h/o of persistent afib after being started on flecainide 100 mg bid resulting in  SR . She feels well. Will feel an occasional flutter. On Xarelto without bleeding issues.   F/u 01/17/16 and is staying in SR but has noticed weak spells for about a week, feels woozy for a couple of seconds. HR 57 bpm, with first degree AV block but PR int is actually shorter than on previous visits. Has had no change in medical condition or meds. Is pleased that she has not had any afib.. 48 hour monitor placed and diltiazem dose was decreased.  F/u 02/12/16 for review after 48 hour monitor results being received. It does show SR with 3-7 sec pauses mostly at night. She states that the woozy spells were worse than usual when wearing the monitor and getting up at night to use the bathroom. Not as symptomatic during the day.  Pt is here for f/u for PPM, 03/10/16, for SSS, . Her Cardizem  Dose was decreased and flecainide was stopped when it was discovered she was having pauses and now she is having issues with afib with rvr. She is having to use prn Cardizem a lot but still not with good results. BP has been on low side and lisinopril/ace on hold. She may need to be reloaded on flecainide. Pacer site well healed and has been seen in device clinic with interrogation 03/06/16.  F/u in afib clinic, 7/10. She continues on flecainide and has noted very little afib. PPM in place with ekg showing a paced rhythm at 50 bpm. She feels well.  Today, she denies symptoms of palpitations, chest pain, shortness of breath, orthopnea, PND, lower extremity edema, dizziness, presyncope, syncope, or neurologic sequela. The patient is tolerating medications without difficulties  and is otherwise without complaint today.   Past Medical History:  Diagnosis Date  . Arthritis    "all over my body" (02/25/2016)  . 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 GERD (gastroesophageal reflux disease)    hx; "when I was working"  . Hypertension   . Macular degeneration   . Paroxysmal atrial fibrillation (HCC)   . Presence of permanent cardiac pacemaker   . 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 BREAST BIOPSY Right   . BREAST LUMPECTOMY Right 1999   RADIATION  . CARDIAC CATHETERIZATION  02/05/1999   HYPERDYNAMIC LEFT VENTRICLE WITH EF OF 75-80%  . CARDIOVERSION N/A 01/22/2015   Procedure: CARDIOVERSION;  Surgeon: [name], MD;  Location: [facility] ENDOSCOPY;  Service: Cardiovascular;  Laterality: N/A;  . CARDIOVERSION N/A 02/22/2015   Procedure: CARDIOVERSION;  Surgeon: [name], MD;  Location: [facility] ENDOSCOPY;  Service: Cardiovascular;  Laterality: N/A;  . DILATION AND CURETTAGE OF UTERUS    . ENUCLEATION Right 2012  . EP IMPLANTABLE DEVICE N/A 02/25/2016   Procedure: Pacemaker Implant;  Surgeon: [name], MD;  Location: [place] CV LAB;  Service: Cardiovascular;  Laterality: N/A;  . INSERT / REPLACE / REMOVE PACEMAKER    . TONSILLECTOMY      Current Outpatient Prescriptions  Medication Sig Dispense Refill  .  acetaminophen (TYLENOL) 650 MG CR tablet Take 650 mg by mouth daily as needed for pain.    . calcium carbonate (TUMS - DOSED IN MG ELEMENTAL CALCIUM) 500 MG chewable tablet Chew 1-2 tablets by mouth daily as needed for indigestion or heartburn.    . Cholecalciferol (VITAMIN D) 1000 UNITS capsule Take 1,000 Units by mouth daily.      . clobetasol cream (TEMOVATE) 0.05 % USE NIGHTLY FOR 1 MONTH 60 g 1  . diltiazem (CARDIZEM CD) 120 MG 24 hr capsule take 1 capsule by mouth twice a day 60 capsule 6  . docusate sodium (COLACE) 100 MG capsule Take 300 mg by mouth at bedtime.       . fexofenadine (ALLEGRA) 180 MG tablet Take 180 mg by mouth daily.    . flecainide (TAMBOCOR) 100 MG tablet Take 1 tablet (100 mg total) by mouth 2 (two) times daily. 180 tablet 3  . Hypromellose (ARTIFICIAL TEARS OP) Place 1 drop into the left eye 3 (three) times daily.    . Multiple Vitamins-Minerals (CENTRUM SILVER PO) Take 1 capsule by mouth daily.     . Multiple Vitamins-Minerals (PRESERVISION/LUTEIN PO) Take 1 capsule by mouth 2 (two) times daily.     [name] Kitchen neomycin-polymyxin-dexameth (MAXITROL) 0.1 % OINT Place 1 application into the right eye at bedtime.    Vladimir Faster Glycol-Propyl Glycol (SYSTANE OP) Place 1 drop into the right eye 3 (three) times daily.    . rivaroxaban (XARELTO) 20 MG TABS tablet Take 1 tablet (20 mg total) by mouth daily with supper.    . triamcinolone cream (KENALOG) 0.1 % Apply 1 application topically daily as needed (rash).     No current facility-administered medications for this encounter.     Allergies  Allergen Reactions  . Shellfish Allergy Hives and Swelling    Shrimp and lobster only  . Atropine     Causes AFIB  . Codeine     Leg pain  . Contrast Media [Iodinated Diagnostic Agents] Hives  . Doxycycline     unknown  . Fenoprofen Calcium Swelling  . Isopto Hyoscine [Scopolamine]     Causes AFIB  . Levaquin [Levofloxacin]     Rash & causes AFIB  . Other     Other reaction(s): Bleeding (intolerance) Nutrasweet  Naphon - swelling  . Oxycodone     Depression - pt tolerates as needed   . Penicillins     Has patient had a PCN reaction causing immediate rash, facial/tongue/throat swelling, SOB or lightheadedness with hypotension: unknown Has patient had a PCN reaction causing severe rash involving mucus membranes or skin necrosis: unknown Has patient had a PCN reaction that required hospitalization unknown Has patient had a PCN reaction occurring within the last 10 years:no If all of the above answers are "NO", then may proceed with Cephalosporin  use. unknown  . Sulfa Drugs Cross Reactors     Ankle swelling   . Warfarin     Other reaction(s): Bleeding (intolerance) From eyes  . Ceclor [Cefaclor] Hives  . Tape Rash    Use paper tape    Social History   Social History  . Marital status: Married    Spouse name: N/A  . Number of children: 3  . Years of education: N/A   Occupational History  .  Retired   Social History Main Topics  . Smoking status: Never Smoker  . Smokeless tobacco: Never Used  . Alcohol use 0.0 oz/week     Comment: 02/25/2016 "glass  of wine/month"  . Drug use: No  . Sexual activity: Yes    Birth control/ protection: Post-menopausal     Comment: 1st intercourse 11 yo-1 partner   Other Topics Concern  . Not on file   Social History Narrative   Lives in [place].  Retired Solicitor.    Family History  Problem Relation Age of Onset  . Hypertension Father   . Heart attack Father   . Parkinsonism Father   . Diabetes Brother   . Heart disease Brother   . Lung cancer Brother   . Stroke Mother     ROS- All systems are reviewed and negative except as per the HPI above  Physical Exam: Vitals:   08/19/16 1452  BP: 124/62  Pulse: (!) 50  Weight: 170 lb (77.1 kg)  Height: 5\' 4"  (1.626 m)    GEN- The patient is well appearing, alert and oriented x 3 today.   Head- normocephalic, atraumatic Eyes-  Sclera clear, conjunctiva pink Ears- hearing intact Oropharynx- clear Neck- supple, no JVP Lymph- no cervical lymphadenopathy Lungs- Clear to ausculation bilaterally, normal work of breathing Heart- irregular rate and rhythm, no murmurs, rubs or gallops, PMI not laterally displaced. PPM site healed GI- soft, NT, ND, + BS Extremities- no clubbing, cyanosis, or edema MS- no significant deformity or atrophy Skin- no rash or lesion Psych- euthymic mood, full affect Neuro- strength and sensation are intact  EKG- atrial paced at 50 bpm, pr int 208 ms, qrs int 100 ms, qtc 424 ms Epic  records reviewed   Assessment and Plan: 1.  Paroxysmal afib afib currently quiet with flecainide 100 mg bid Continue cardizem at 120 mg qd Continue xarelto with crcl cal at 58 (creat of 0.90 02/19/16) for chadsvasc score of at least 3  2. PPM Per Dr. Rayann Heman  F/u with Dr. Rayann Heman in October as scheduled  Geroge Baseman. Brayton Baumgartner, [facility] [address] [place], [place] [address] [phone]

## 2016-08-26 ENCOUNTER — Ambulatory Visit (INDEPENDENT_AMBULATORY_CARE_PROVIDER_SITE_OTHER): Payer: Medicare Other | Admitting: *Deleted

## 2016-08-26 DIAGNOSIS — I495 Sick sinus syndrome: Secondary | ICD-10-CM | POA: Diagnosis not present

## 2016-08-26 NOTE — Progress Notes (Signed)
Remote pacemaker transmission.   

## 2016-08-27 ENCOUNTER — Encounter: Payer: Self-pay | Admitting: Cardiology

## 2016-08-27 LAB — CUP PACEART REMOTE DEVICE CHECK
Brady Statistic AP VS Percent: 37.22 %
Brady Statistic AS VP Percent: 10.76 %
Brady Statistic RA Percent Paced: 31.33 %
Date Time Interrogation Session: 20180717170128
Implantable Lead Location: 753859
Implantable Lead Location: 753860
Implantable Lead Model: 5076
Implantable Lead Model: 5076
Lead Channel Impedance Value: 380 Ohm
Lead Channel Pacing Threshold Amplitude: 0.75 V
Lead Channel Pacing Threshold Pulse Width: 0.4 ms
Lead Channel Sensing Intrinsic Amplitude: 1.375 mV
Lead Channel Sensing Intrinsic Amplitude: 14.5 mV
Lead Channel Sensing Intrinsic Amplitude: 14.5 mV
Lead Channel Setting Pacing Amplitude: 2.5 V
Lead Channel Setting Pacing Pulse Width: 0.4 ms
MDC IDC LEAD IMPLANT DT: 20180115
MDC IDC LEAD IMPLANT DT: 20180115
MDC IDC MSMT BATTERY REMAINING LONGEVITY: 112 mo
MDC IDC MSMT BATTERY VOLTAGE: 3.03 V
MDC IDC MSMT LEADCHNL RA IMPEDANCE VALUE: 475 Ohm
MDC IDC MSMT LEADCHNL RA PACING THRESHOLD AMPLITUDE: 0.75 V
MDC IDC MSMT LEADCHNL RA PACING THRESHOLD PULSEWIDTH: 0.4 ms
MDC IDC MSMT LEADCHNL RA SENSING INTR AMPL: 1.375 mV
MDC IDC MSMT LEADCHNL RV IMPEDANCE VALUE: 570 Ohm
MDC IDC MSMT LEADCHNL RV IMPEDANCE VALUE: 684 Ohm
MDC IDC PG IMPLANT DT: 20180115
MDC IDC SET LEADCHNL RA PACING AMPLITUDE: 2 V
MDC IDC SET LEADCHNL RV SENSING SENSITIVITY: 2 mV
MDC IDC STAT BRADY AP VP PERCENT: 2.52 %
MDC IDC STAT BRADY AS VS PERCENT: 49.5 %
MDC IDC STAT BRADY RV PERCENT PACED: 10.61 %

## 2016-11-17 ENCOUNTER — Encounter: Payer: Self-pay | Admitting: Internal Medicine

## 2016-11-17 ENCOUNTER — Ambulatory Visit (INDEPENDENT_AMBULATORY_CARE_PROVIDER_SITE_OTHER): Payer: Medicare Other | Admitting: Internal Medicine

## 2016-11-17 VITALS — BP 138/68 | HR 57 | Ht 64.0 in | Wt 170.0 lb

## 2016-11-17 DIAGNOSIS — I10 Essential (primary) hypertension: Secondary | ICD-10-CM | POA: Diagnosis not present

## 2016-11-17 DIAGNOSIS — I495 Sick sinus syndrome: Secondary | ICD-10-CM

## 2016-11-17 DIAGNOSIS — I480 Paroxysmal atrial fibrillation: Secondary | ICD-10-CM

## 2016-11-17 NOTE — Progress Notes (Signed)
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 female who presents today for routine electrophysiology followup.  Since last being seen in our clinic, the patient reports doing very well.  She is mostly unaware of her afib.  Today, she denies symptoms of palpitations, chest pain, shortness of breath,  lower extremity edema, dizziness, presyncope, or syncope.  The patient is otherwise without complaint today.   Past Medical History:  Diagnosis Date  . Arthritis    "all over my body" (02/25/2016)  . 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 GERD (gastroesophageal reflux disease)    hx; "when I was working"  . Hypertension   . Macular degeneration   . Paroxysmal atrial fibrillation (HCC)   . Presence of permanent cardiac pacemaker   . 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 BREAST BIOPSY Right   . BREAST LUMPECTOMY Right 1999   RADIATION  . CARDIAC CATHETERIZATION  02/05/1999   HYPERDYNAMIC LEFT VENTRICLE WITH EF OF 75-80%  . CARDIOVERSION N/A 01/22/2015   Procedure: CARDIOVERSION;  Surgeon: [name], MD;  Location: [facility] ENDOSCOPY;  Service: Cardiovascular;  Laterality: N/A;  . CARDIOVERSION N/A 02/22/2015   Procedure: CARDIOVERSION;  Surgeon: [name], MD;  Location: [facility] ENDOSCOPY;  Service: Cardiovascular;  Laterality: N/A;  . DILATION AND CURETTAGE OF UTERUS    . ENUCLEATION Right 2012  . EP IMPLANTABLE DEVICE N/A 02/25/2016   Procedure: Pacemaker Implant;  Surgeon: [name], MD;  Location: [place] CV LAB;  Service: Cardiovascular;  Laterality: N/A;  . INSERT / REPLACE / REMOVE PACEMAKER    . TONSILLECTOMY      ROS- all systems are reviewed and negative except as per HPI above  Current Outpatient Prescriptions  Medication Sig Dispense Refill  . acetaminophen (TYLENOL) 650 MG CR tablet Take 650 mg by mouth daily as needed for pain.    .  calcium carbonate (TUMS - DOSED IN MG ELEMENTAL CALCIUM) 500 MG chewable tablet Chew 1-2 tablets by mouth daily as needed for indigestion or heartburn.    . cetirizine (ZYRTEC) 10 MG tablet Take 10 mg by mouth daily as needed for allergies.    . Cholecalciferol (VITAMIN D) 1000 UNITS capsule Take 1,000 Units by mouth daily.      . clobetasol cream (TEMOVATE) 8.29 % Apply 1 application topically daily as needed (As directed).    [name] Kitchen diltiazem (CARDIZEM CD) 120 MG 24 hr capsule take 1 capsule by mouth twice a day 60 capsule 6  . fexofenadine (ALLEGRA) 180 MG tablet Take 180 mg by mouth daily as needed for allergies.     . flecainide (TAMBOCOR) 100 MG tablet Take 1 tablet (100 mg total) by mouth 2 (two) times daily. 180 tablet 3  . Hypromellose (ARTIFICIAL TEARS OP) Place 1 drop into the left eye 3 (three) times daily.    . Multiple Vitamins-Minerals (CENTRUM SILVER PO) Take 1 capsule by mouth daily.     . Multiple Vitamins-Minerals (PRESERVISION/LUTEIN PO) Take 1 capsule by mouth 2 (two) times daily.     [name] Kitchen neomycin-polymyxin-dexameth (MAXITROL) 0.1 % OINT Place 1 application into the right eye at bedtime.    Vladimir Faster Glycol-Propyl Glycol (SYSTANE OP) Place 1 drop into the right eye 3 (three) times daily.    . polyethylene glycol (MIRALAX / GLYCOLAX) packet Take  17 g by mouth daily.    . rivaroxaban (XARELTO) 20 MG TABS tablet Take 1 tablet (20 mg total) by mouth daily with supper.    . triamcinolone cream (KENALOG) 0.1 % Apply 1 application topically daily as needed (rash).     No current facility-administered medications for this visit.     Physical Exam: Vitals:   11/17/16 1511  BP: 138/68  Pulse: (!) 57  SpO2: 96%  Weight: 170 lb (77.1 kg)  Height: 5\' 4"  (1.626 m)    GEN- The patient is well appearing, alert and oriented x 3 today.   Head- normocephalic, atraumatic Ears- hearing intact Oropharynx- clear Lungs- Clear to ausculation bilaterally, normal work of breathing Chest-  pacemaker pocket is well healed Heart- Regular rate and rhythm, no murmurs, rubs or gallops, PMI not laterally displaced GI- soft, NT, ND, + BS Extremities- no clubbing, cyanosis, or edema  Pacemaker interrogation- reviewed in detail today,  See PACEART report  ekg tracing ordered today is personally reviewed and shows sinus rhythm with demand A and V pacing  Assessment and Plan:  1. Symptomatic sinus bradycardia  Normal pacemaker function See Pace Art report Lower rate increased to 60 bpm and rate response turned on today Given AV delay, may consider reducing diltiazem in the future  2. Paroxysmal atrial fibrillation afib burden has increased from 11% to 27.4% however she is not aware No changes today Consider tikosyn should she develop worsening symptoms with her afib Continue xarelto  3. HTN Stable No change required today  Carelink Return to see EP NP in 6 months  [name] MD, [facility] 11/17/2016 3:41 PM

## 2016-11-17 NOTE — Patient Instructions (Addendum)
Medication Instructions:  Your physician recommends that you continue on your current medications as directed. Please refer to the Current Medication list given to you today.   Labwork: None ordered   Testing/Procedures: None ordered   Follow-Up: Remote monitoring is used to monitor your Pacemaker from home. This monitoring reduces the number of office visits required to check your device to one time per year. It allows [place] to keep an eye on the functioning of your device to ensure it is working properly. You are scheduled for a device check from home on 11/25/16. You may send your transmission at any time that day. If you have a wireless device, the transmission will be sent automatically. After your physician reviews your transmission, you will receive a postcard with your next transmission date.  Your physician wants you to follow-up in: 6 months with Chanetta Marshall. You will receive a reminder letter in the mail two months in advance. If you don't receive a letter, please call our office to schedule the follow-up appointment.   Any Other Special Instructions Will Be Listed Below (If Applicable).     If you need a refill on your cardiac medications before your next appointment, please call your pharmacy.

## 2016-11-18 LAB — CUP PACEART INCLINIC DEVICE CHECK
Battery Voltage: 3.02 V
Brady Statistic AP VP Percent: 2.29 %
Brady Statistic AS VP Percent: 6.56 %
Brady Statistic RA Percent Paced: 44.08 %
Date Time Interrogation Session: 20181009004758
Implantable Lead Implant Date: 20180115
Implantable Lead Implant Date: 20180115
Implantable Lead Location: 753859
Implantable Lead Model: 5076
Implantable Pulse Generator Implant Date: 20180115
Lead Channel Impedance Value: 665 Ohm
Lead Channel Pacing Threshold Pulse Width: 0.4 ms
Lead Channel Sensing Intrinsic Amplitude: 14 mV
Lead Channel Sensing Intrinsic Amplitude: 16 mV
Lead Channel Sensing Intrinsic Amplitude: 3.25 mV
Lead Channel Setting Pacing Amplitude: 2 V
Lead Channel Setting Pacing Amplitude: 2.5 V
Lead Channel Setting Pacing Pulse Width: 0.4 ms
Lead Channel Setting Sensing Sensitivity: 2 mV
MDC IDC LEAD LOCATION: 753860
MDC IDC MSMT BATTERY REMAINING LONGEVITY: 107 mo
MDC IDC MSMT LEADCHNL RA IMPEDANCE VALUE: 380 Ohm
MDC IDC MSMT LEADCHNL RA IMPEDANCE VALUE: 456 Ohm
MDC IDC MSMT LEADCHNL RA PACING THRESHOLD AMPLITUDE: 0.75 V
MDC IDC MSMT LEADCHNL RA SENSING INTR AMPL: 2.75 mV
MDC IDC MSMT LEADCHNL RV IMPEDANCE VALUE: 532 Ohm
MDC IDC MSMT LEADCHNL RV PACING THRESHOLD AMPLITUDE: 0.75 V
MDC IDC MSMT LEADCHNL RV PACING THRESHOLD PULSEWIDTH: 0.4 ms
MDC IDC STAT BRADY AP VS PERCENT: 51.03 %
MDC IDC STAT BRADY AS VS PERCENT: 40.11 %
MDC IDC STAT BRADY RV PERCENT PACED: 7.37 %

## 2016-11-25 ENCOUNTER — Ambulatory Visit (INDEPENDENT_AMBULATORY_CARE_PROVIDER_SITE_OTHER): Payer: Medicare Other | Admitting: *Deleted

## 2016-11-25 DIAGNOSIS — I495 Sick sinus syndrome: Secondary | ICD-10-CM | POA: Diagnosis not present

## 2016-11-25 NOTE — Progress Notes (Signed)
Remote pacemaker transmission.   

## 2016-11-27 LAB — CUP PACEART REMOTE DEVICE CHECK
Battery Voltage: 3.02 V
Brady Statistic AP VP Percent: 5.45 %
Brady Statistic AS VP Percent: 9.52 %
Brady Statistic AS VS Percent: 40.2 %
Implantable Lead Implant Date: 20180115
Implantable Lead Implant Date: 20180115
Implantable Lead Model: 5076
Implantable Pulse Generator Implant Date: 20180115
Lead Channel Impedance Value: 361 Ohm
Lead Channel Impedance Value: 456 Ohm
Lead Channel Impedance Value: 513 Ohm
Lead Channel Pacing Threshold Amplitude: 0.75 V
Lead Channel Pacing Threshold Amplitude: 0.75 V
Lead Channel Pacing Threshold Pulse Width: 0.4 ms
Lead Channel Setting Pacing Amplitude: 2 V
MDC IDC LEAD LOCATION: 753859
MDC IDC LEAD LOCATION: 753860
MDC IDC MSMT BATTERY REMAINING LONGEVITY: 107 mo
MDC IDC MSMT LEADCHNL RA PACING THRESHOLD PULSEWIDTH: 0.4 ms
MDC IDC MSMT LEADCHNL RA SENSING INTR AMPL: 3.375 mV
MDC IDC MSMT LEADCHNL RA SENSING INTR AMPL: 3.375 mV
MDC IDC MSMT LEADCHNL RV IMPEDANCE VALUE: 646 Ohm
MDC IDC MSMT LEADCHNL RV SENSING INTR AMPL: 13.875 mV
MDC IDC MSMT LEADCHNL RV SENSING INTR AMPL: 13.875 mV
MDC IDC SESS DTM: 20181016184724
MDC IDC SET LEADCHNL RV PACING AMPLITUDE: 2.5 V
MDC IDC SET LEADCHNL RV PACING PULSEWIDTH: 0.4 ms
MDC IDC SET LEADCHNL RV SENSING SENSITIVITY: 2 mV
MDC IDC STAT BRADY AP VS PERCENT: 44.84 %
MDC IDC STAT BRADY RA PERCENT PACED: 47.19 %
MDC IDC STAT BRADY RV PERCENT PACED: 14.12 %

## 2016-11-28 ENCOUNTER — Encounter: Payer: Self-pay | Admitting: Cardiology

## 2017-01-27 ENCOUNTER — Other Ambulatory Visit: Payer: Self-pay | Admitting: Internal Medicine

## 2017-01-27 DIAGNOSIS — Z1231 Encounter for screening mammogram for malignant neoplasm of breast: Secondary | ICD-10-CM

## 2017-02-07 ENCOUNTER — Other Ambulatory Visit (HOSPITAL_COMMUNITY): Payer: Self-pay | Admitting: Nurse Practitioner

## 2017-02-24 ENCOUNTER — Telehealth: Payer: Self-pay | Admitting: Cardiology

## 2017-02-24 ENCOUNTER — Ambulatory Visit (INDEPENDENT_AMBULATORY_CARE_PROVIDER_SITE_OTHER): Payer: Medicare Other | Admitting: *Deleted

## 2017-02-24 DIAGNOSIS — I495 Sick sinus syndrome: Secondary | ICD-10-CM | POA: Diagnosis not present

## 2017-02-24 NOTE — Telephone Encounter (Signed)
Spoke with pt and reminded pt of remote transmission that is due today. Pt verbalized understanding.   

## 2017-02-25 NOTE — Progress Notes (Signed)
Remote pacemaker transmission.   

## 2017-02-26 LAB — CUP PACEART REMOTE DEVICE CHECK
Battery Remaining Longevity: 97 mo
Battery Voltage: 3.01 V
Brady Statistic AP VP Percent: 3.43 %
Brady Statistic AS VS Percent: 32.9 %
Brady Statistic RA Percent Paced: 58.88 %
Implantable Lead Implant Date: 20180115
Implantable Lead Implant Date: 20180115
Implantable Lead Location: 753859
Implantable Lead Location: 753860
Implantable Lead Model: 5076
Implantable Pulse Generator Implant Date: 20180115
Lead Channel Impedance Value: 380 Ohm
Lead Channel Impedance Value: 532 Ohm
Lead Channel Pacing Threshold Amplitude: 0.75 V
Lead Channel Pacing Threshold Pulse Width: 0.4 ms
Lead Channel Pacing Threshold Pulse Width: 0.4 ms
Lead Channel Sensing Intrinsic Amplitude: 1.375 mV
Lead Channel Sensing Intrinsic Amplitude: 13.375 mV
Lead Channel Setting Pacing Amplitude: 2 V
Lead Channel Setting Pacing Amplitude: 2.5 V
MDC IDC MSMT LEADCHNL RA IMPEDANCE VALUE: 475 Ohm
MDC IDC MSMT LEADCHNL RA SENSING INTR AMPL: 1.375 mV
MDC IDC MSMT LEADCHNL RV IMPEDANCE VALUE: 627 Ohm
MDC IDC MSMT LEADCHNL RV PACING THRESHOLD AMPLITUDE: 0.75 V
MDC IDC MSMT LEADCHNL RV SENSING INTR AMPL: 13.375 mV
MDC IDC SESS DTM: 20190116003151
MDC IDC SET LEADCHNL RV PACING PULSEWIDTH: 0.4 ms
MDC IDC SET LEADCHNL RV SENSING SENSITIVITY: 2 mV
MDC IDC STAT BRADY AP VS PERCENT: 59.07 %
MDC IDC STAT BRADY AS VP PERCENT: 4.6 %
MDC IDC STAT BRADY RV PERCENT PACED: 7.51 %

## 2017-02-27 ENCOUNTER — Ambulatory Visit
Admission: RE | Admit: 2017-02-27 | Discharge: 2017-02-27 | Disposition: A | Discharge status: Home / Self Care | Payer: Medicare Other | Source: Ambulatory Visit | Attending: Internal Medicine | Admitting: Internal Medicine

## 2017-02-27 ENCOUNTER — Encounter: Payer: Self-pay | Admitting: Cardiology

## 2017-02-27 DIAGNOSIS — Z1231 Encounter for screening mammogram for malignant neoplasm of breast: Secondary | ICD-10-CM

## 2017-03-03 ENCOUNTER — Other Ambulatory Visit (HOSPITAL_COMMUNITY): Payer: Self-pay | Admitting: Nurse Practitioner

## 2017-04-13 ENCOUNTER — Telehealth: Payer: Self-pay | Admitting: Internal Medicine

## 2017-04-13 NOTE — Telephone Encounter (Signed)
New Message       [facility] Pre-operative Risk Assessment    Request for surgical clearance:  1. What type of surgery is being performed? Dental Extraction  2. When is this surgery scheduled? 04/22/2017   3. What type of clearance is required (medical clearance vs. Pharmacy clearance to hold med vs. Both)? Pharmacy   4. Are there any medications that need to be held prior to surgery and how long? Xarelto   5. Practice name and name of physician performing surgery? [facility], Dr. Ronnald Ramp   6. What is your office phone and fax number? Office [phone] fax number [phone]   7. Anesthesia type (None, local, MAC, general) ? Local But they need to know does it need to be with or without ephinephren   Avaletta L Williams 04/13/2017, 3:06 PM  _________________________________________________________________   (provider comments below)

## 2017-04-14 NOTE — Telephone Encounter (Signed)
Will route to Dr. Rayann Heman to weigh in on use of epinephrine with tooth extraction.

## 2017-04-14 NOTE — Telephone Encounter (Signed)
Do not need to hold Xarelto prior to single dental extraction.  Routing back to pre op pool to address epinephrine question.

## 2017-04-15 NOTE — Telephone Encounter (Signed)
See attached recommendations regarding anticoagulation and use of epinephrine.   I will efax this correspondence to the requesting office and remove from our pre-op pool.  Daune Perch, AGNP-C [facility] 04/15/2017  1:03 PM

## 2017-04-15 NOTE — Telephone Encounter (Signed)
Avoid epinephrine if possible as it may contribute to afib.  If necessary, ok to use.

## 2017-04-29 ENCOUNTER — Telehealth: Payer: Self-pay | Admitting: Internal Medicine

## 2017-04-29 MED ORDER — RIVAROXABAN 20 MG PO TABS: 20.0000 mg | ORAL_TABLET | Freq: Every day | ORAL | 6 refills | Status: DC

## 2017-04-29 NOTE — Telephone Encounter (Signed)
New Message:    Pt is calling to see if we have any samples of the rivaroxaban (XARELTO) 20 MG TABS tablet

## 2017-04-29 NOTE — Telephone Encounter (Signed)
Pt saw Dr Rayann Heman 11/17/16. Weight at that time was 77.1Kg. She is a 84 yr old female. SCr from [facility] in [place] from 08/07/16 was 0.80. CrCL is 64 mL/min. Will send in Xarelto 20mg  QD Rx.

## 2017-04-29 NOTE — Telephone Encounter (Signed)
Spoke with patient and questioned which provider has been refilling this medication for her as it has not been filled by our office for quite some time. She admitted that she has not had it refilled as she has been maintaining on samples. I made her aware that we would not be able to continue to maintain her on samples and if cost was an issue then she should consider patient assistance or switching to a more affordable medication. She stated that she would refill it at her pharmacy but she needs a new rx [facility]. I made her aware that I would provide her with a week or two of samples and route a msg to have it refilled for her. Patient verbalized understanding and appreciation.

## 2017-05-05 ENCOUNTER — Encounter: Payer: Self-pay | Admitting: Internal Medicine

## 2017-05-14 ENCOUNTER — Other Ambulatory Visit: Payer: Self-pay | Admitting: *Deleted

## 2017-05-18 ENCOUNTER — Encounter: Payer: Self-pay | Admitting: Internal Medicine

## 2017-05-18 ENCOUNTER — Ambulatory Visit: Payer: Medicare Other | Admitting: Internal Medicine

## 2017-05-18 VITALS — BP 116/60 | HR 85 | Ht 64.0 in | Wt 165.0 lb

## 2017-05-18 DIAGNOSIS — Z950 Presence of cardiac pacemaker: Secondary | ICD-10-CM | POA: Diagnosis not present

## 2017-05-18 DIAGNOSIS — I480 Paroxysmal atrial fibrillation: Secondary | ICD-10-CM

## 2017-05-18 DIAGNOSIS — I495 Sick sinus syndrome: Secondary | ICD-10-CM | POA: Diagnosis not present

## 2017-05-18 DIAGNOSIS — I10 Essential (primary) hypertension: Secondary | ICD-10-CM

## 2017-05-18 LAB — CUP PACEART INCLINIC DEVICE CHECK
Battery Remaining Longevity: 94 mo
Battery Voltage: 3.01 V
Brady Statistic AP VP Percent: 4.91 %
Brady Statistic AP VS Percent: 51.54 %
Brady Statistic AS VS Percent: 36.18 %
Brady Statistic RV Percent Paced: 11.54 %
Date Time Interrogation Session: 20190408150614
Implantable Lead Implant Date: 20180115
Implantable Lead Location: 753859
Implantable Lead Model: 5076
Implantable Pulse Generator Implant Date: 20180115
Lead Channel Impedance Value: 456 Ohm
Lead Channel Impedance Value: 494 Ohm
Lead Channel Impedance Value: 570 Ohm
Lead Channel Pacing Threshold Amplitude: 0.625 V
Lead Channel Pacing Threshold Amplitude: 0.75 V
Lead Channel Sensing Intrinsic Amplitude: 13.5 mV
Lead Channel Setting Pacing Amplitude: 2 V
Lead Channel Setting Pacing Amplitude: 2.5 V
Lead Channel Setting Sensing Sensitivity: 2 mV
MDC IDC LEAD IMPLANT DT: 20180115
MDC IDC LEAD LOCATION: 753860
MDC IDC MSMT LEADCHNL RA IMPEDANCE VALUE: 380 Ohm
MDC IDC MSMT LEADCHNL RA PACING THRESHOLD PULSEWIDTH: 0.4 ms
MDC IDC MSMT LEADCHNL RA SENSING INTR AMPL: 1.25 mV
MDC IDC MSMT LEADCHNL RA SENSING INTR AMPL: 1.75 mV
MDC IDC MSMT LEADCHNL RV PACING THRESHOLD PULSEWIDTH: 0.4 ms
MDC IDC MSMT LEADCHNL RV SENSING INTR AMPL: 14.25 mV
MDC IDC SET LEADCHNL RV PACING PULSEWIDTH: 0.4 ms
MDC IDC STAT BRADY AS VP PERCENT: 7.37 %
MDC IDC STAT BRADY RA PERCENT PACED: 52.58 %

## 2017-05-18 NOTE — Patient Instructions (Addendum)
Medication Instructions:  Your physician recommends that you continue on your current medications as directed. Please refer to the Current Medication list given to you today.  Labwork: None ordered.  Testing/Procedures: None ordered.  Follow-Up: Your physician wants you to follow-up in: 6 months with Chanetta Marshall, NP.   You will receive a reminder letter in the mail two months in advance. If you don't receive a letter, please call our office to schedule the follow-up appointment.  Remote monitoring is used to monitor your Pacemaker from home. This monitoring reduces the number of office visits required to check your device to one time per year. It allows [place] to keep an eye on the functioning of your device to ensure it is working properly. You are scheduled for a device check from home on 05/26/2017. You may send your transmission at any time that day. If you have a wireless device, the transmission will be sent automatically. After your physician reviews your transmission, you will receive a postcard with your next transmission date.  Any Other Special Instructions Will Be Listed Below (If Applicable).  If you need a refill on your cardiac medications before your next appointment, please call your pharmacy.

## 2017-05-18 NOTE — Progress Notes (Signed)
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  Since last being seen in our clinic, the patient reports doing very well.  She is active.  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  At times, he is up during the night and she finds this to be hard.  He has been sleeping better recently.  She is currently better rested. Today, she denies symptoms of palpitations, chest pain, shortness of breath,  lower extremity edema, dizziness, presyncope, or syncope.  The patient is otherwise without complaint today.   Past Medical History:  Diagnosis Date  . Arthritis    "all over my body" (02/25/2016)  . 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 GERD (gastroesophageal reflux disease)    hx; "when I was working"  . Hypertension   . Macular degeneration   . Paroxysmal atrial fibrillation (HCC)   . Presence of permanent cardiac pacemaker   . 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 BREAST BIOPSY Right   . BREAST LUMPECTOMY Right 1999   RADIATION  . CARDIAC CATHETERIZATION  02/05/1999   HYPERDYNAMIC LEFT VENTRICLE WITH EF OF 75-80%  . CARDIOVERSION N/A 01/22/2015   Procedure: CARDIOVERSION;  Surgeon: [name], MD;  Location: [facility] ENDOSCOPY;  Service: Cardiovascular;  Laterality: N/A;  . CARDIOVERSION N/A 02/22/2015   Procedure: CARDIOVERSION;  Surgeon: [name], MD;  Location: [facility] ENDOSCOPY;  Service: Cardiovascular;  Laterality: N/A;  . DILATION AND CURETTAGE OF UTERUS    . ENUCLEATION Right 2012  . EP IMPLANTABLE DEVICE N/A 02/25/2016   Procedure: Pacemaker Implant;  Surgeon: [name], MD;  Location: [place] CV LAB;  Service: Cardiovascular;  Laterality: N/A;  . INSERT / REPLACE / REMOVE PACEMAKER    . TONSILLECTOMY      ROS- all systems are reviewed and negative except as per HPI above  Current  Outpatient Medications  Medication Sig Dispense Refill  . acetaminophen (TYLENOL) 650 MG CR tablet Take 650 mg by mouth daily as needed for pain.    . calcium carbonate (TUMS - DOSED IN MG ELEMENTAL CALCIUM) 500 MG chewable tablet Chew 1-2 tablets by mouth daily as needed for indigestion or heartburn.    . cetirizine (ZYRTEC) 10 MG tablet Take 10 mg by mouth daily as needed for allergies.    . Cholecalciferol (VITAMIN D) 1000 UNITS capsule Take 1,000 Units by mouth daily.      . clobetasol cream (TEMOVATE) 0.10 % Apply 1 application topically daily as needed (As directed).    [name] Kitchen diltiazem (CARDIZEM CD) 120 MG 24 hr capsule TAKE (1) CAPSULE TWICE DAILY. 60 capsule 11  . fexofenadine (ALLEGRA) 180 MG tablet Take 180 mg by mouth daily as needed for allergies.     . flecainide (TAMBOCOR) 100 MG tablet TAKE 1 TABLET BY MOUTH TWICE DAILY. 180 tablet 0  . Glucosamine-Chondroitin (GLUCOSAMINE CHONDR COMPLEX PO) Take 1 capsule by mouth daily.    . Hypromellose (ARTIFICIAL TEARS OP) Place 1 drop into the left eye 3 (three) times daily.    . Multiple Vitamins-Minerals (CENTRUM SILVER PO) Take 1 capsule by mouth daily.     . Multiple Vitamins-Minerals (PRESERVISION/LUTEIN PO) Take 1 capsule by mouth 2 (two) times daily.     [name] Kitchen neomycin-polymyxin-dexameth (MAXITROL) 0.1 % OINT Place 1  application into the right eye at bedtime.    [name] Kitchen oxybutynin (DITROPAN) 5 MG tablet Take 5 mg by mouth 3 (three) times daily.    Vladimir Faster Glycol-Propyl Glycol (SYSTANE OP) Place 1 drop into the right eye 3 (three) times daily.    . polyethylene glycol (MIRALAX / GLYCOLAX) packet Take 17 g by mouth daily.    . rivaroxaban (XARELTO) 20 MG TABS tablet Take 1 tablet (20 mg total) by mouth daily with supper. 30 tablet 6  . sertraline (ZOLOFT) 50 MG tablet Take 50 mg by mouth daily.    [name] Kitchen triamcinolone cream (KENALOG) 0.1 % Apply 1 application topically daily as needed (rash).     No current facility-administered medications for this  visit.     Physical Exam: Vitals:   05/18/17 1408  BP: 116/60  Pulse: 85  SpO2: 98%  Weight: 165 lb (74.8 kg)  Height: 5\' 4"  (1.626 m)    GEN- The patient is well appearing, alert and oriented x 3 today.   Head- normocephalic, atraumatic Ears- hearing intact Oropharynx- clear Lungs- Clear to ausculation bilaterally, normal work of breathing Chest- pacemaker pocket is well healed Heart- irregular rate and rhythm, no murmurs, rubs or gallops, PMI not laterally displaced GI- soft, NT, ND, + BS Extremities- no clubbing, cyanosis, or edema  Pacemaker interrogation- reviewed in detail today,  See PACEART report  ekg tracing ordered today is personally reviewed and shows atypical atrial flutter, V rate 82 bpm, RBBB, QTc 497 msec, nonspecific ST/T Changes  Assessment and Plan:  1. Symptomatic sinus bradycardia  Normal pacemaker function See Pace Art report No changes today  2. Persistent atrial fibrillation/ atypical atrial flutter She has been in an atypical atrial flutter recently afib burden increased but she is doing well 31.3 % today (11%-->27% last visit) She remains asymptomatic and rate controlled Would not advise AAD therapy at this time On xarelto  3. HTN Stable No change required today  Carelink Return to see EP NP in 6 months  [name] MD, [facility] 05/18/2017 2:17 PM

## 2017-05-26 ENCOUNTER — Ambulatory Visit (INDEPENDENT_AMBULATORY_CARE_PROVIDER_SITE_OTHER): Payer: Medicare Other | Admitting: *Deleted

## 2017-05-26 DIAGNOSIS — I495 Sick sinus syndrome: Secondary | ICD-10-CM | POA: Diagnosis not present

## 2017-05-26 NOTE — Progress Notes (Signed)
Remote pacemaker transmission.   

## 2017-05-27 LAB — CUP PACEART REMOTE DEVICE CHECK
Battery Remaining Longevity: 94 mo
Brady Statistic AP VS Percent: 11.34 %
Brady Statistic AS VS Percent: 63.2 %
Brady Statistic RV Percent Paced: 22.73 %
Date Time Interrogation Session: 20190416192400
Implantable Lead Implant Date: 20180115
Implantable Lead Location: 753859
Implantable Lead Location: 753860
Lead Channel Impedance Value: 475 Ohm
Lead Channel Sensing Intrinsic Amplitude: 1.25 mV
Lead Channel Sensing Intrinsic Amplitude: 1.25 mV
Lead Channel Sensing Intrinsic Amplitude: 13.25 mV
Lead Channel Setting Pacing Amplitude: 2.5 V
MDC IDC LEAD IMPLANT DT: 20180115
MDC IDC MSMT BATTERY VOLTAGE: 3.01 V
MDC IDC MSMT LEADCHNL RA IMPEDANCE VALUE: 361 Ohm
MDC IDC MSMT LEADCHNL RA IMPEDANCE VALUE: 456 Ohm
MDC IDC MSMT LEADCHNL RA PACING THRESHOLD AMPLITUDE: 0.625 V
MDC IDC MSMT LEADCHNL RA PACING THRESHOLD PULSEWIDTH: 0.4 ms
MDC IDC MSMT LEADCHNL RV IMPEDANCE VALUE: 551 Ohm
MDC IDC MSMT LEADCHNL RV PACING THRESHOLD AMPLITUDE: 0.75 V
MDC IDC MSMT LEADCHNL RV PACING THRESHOLD PULSEWIDTH: 0.4 ms
MDC IDC MSMT LEADCHNL RV SENSING INTR AMPL: 13.25 mV
MDC IDC PG IMPLANT DT: 20180115
MDC IDC SET LEADCHNL RA PACING AMPLITUDE: 2 V
MDC IDC SET LEADCHNL RV PACING PULSEWIDTH: 0.4 ms
MDC IDC SET LEADCHNL RV SENSING SENSITIVITY: 2 mV
MDC IDC STAT BRADY AP VP PERCENT: 8.3 %
MDC IDC STAT BRADY AS VP PERCENT: 17.16 %
MDC IDC STAT BRADY RA PERCENT PACED: 17.14 %

## 2017-05-28 ENCOUNTER — Encounter: Payer: Self-pay | Admitting: Cardiology

## 2017-06-01 ENCOUNTER — Other Ambulatory Visit (HOSPITAL_COMMUNITY): Payer: Self-pay | Admitting: Nurse Practitioner

## 2017-06-23 ENCOUNTER — Telehealth: Payer: Self-pay | Admitting: Internal Medicine

## 2017-06-23 NOTE — Telephone Encounter (Signed)
Returned call to Pt.  Pt concerned that Zoloft was causing her afib.  Discussed with APP, advised Zoloft was probably not related to patient's afib. Advised Pt.  Pt indicates understanding.

## 2017-06-23 NOTE — Telephone Encounter (Signed)
New Message   Pt c/o medication issue:  1. Name of Medication: sertraline (ZOLOFT) 50 MG tablet  2. How are you currently taking this medication (dosage and times per day)? Take 50 mg by mouth daily  3. Are you having a reaction (difficulty breathing--STAT)? yes  4. What is your medication issue? Pt states that she has been having Afib and notice this medication says he could cause it. Please call

## 2017-08-06 ENCOUNTER — Telehealth: Payer: Self-pay | Admitting: Internal Medicine

## 2017-08-06 NOTE — Telephone Encounter (Signed)
Returned call to family.  Per family Pt saw a Dr. Donella Stade today at [facility] and advised family to have Pt follow up with cardiology as soon as possible for "low blood pressure".  Advised family I would contact this physician and have her send her office note to Dr. Rayann Heman for review.  Per family Pt is not dizzy, light headed or fatigued.  Attempted to get office note.  Doctor had not completed yet.  Will call tomorrow to have note faxed to this office.  [phone]  Will follow up

## 2017-08-06 NOTE — Telephone Encounter (Signed)
New message      Pt c/o BP issue: STAT if pt c/o blurred vision, one-sided weakness or slurred speech  1. What are your last 5 BP readings? 96/63  2. Are you having any other symptoms (ex. Dizziness, headache, blurred vision, passed out)? No  3. What is your BP issue? Patient's son calling with concerns of afib

## 2017-08-07 NOTE — Telephone Encounter (Signed)
[facility] for [facility].  Requested office note from yesterday be faxed to office.  Numbers given.

## 2017-08-10 ENCOUNTER — Telehealth: Payer: Self-pay | Admitting: Internal Medicine

## 2017-08-10 NOTE — Telephone Encounter (Signed)
Left message for clinic manager notifying an employee at his clinic had advised Pt to seek immediate cardiac f/u and start taking dofetilide for afib.

## 2017-08-10 NOTE — Telephone Encounter (Signed)
Returned call to Pt's son.  Discussed BP.  Per son a nurse at the neuropsych office told family to call Cardiologist asap for low BP.   Advised son Pt BP was ok if Pt not symptomatic- fatigue, dizziness.  Advised per Dr. Rayann Heman to continue on current regimen as Pt is asymptomatic and rate controlled.  Son indicates understanding.  Thanked for return call.

## 2017-08-10 NOTE — Telephone Encounter (Signed)
Left detailed message per DPR.  Advised this nurse had reviewed office note from [place] NP with [facility].  No mention of any cardiac issue documented.  Advised that BP was ok.  Monitor for fatigue, dizziness, presyncope.  If any symptoms call office.  Left this nurse name and # for call back if any questions.

## 2017-08-10 NOTE — Telephone Encounter (Signed)
Call placed to neuropsychiatric center.  Faxing notes to office.

## 2017-08-10 NOTE — Telephone Encounter (Signed)
New message:      Pt's son is calling back to speak with the RN pertaining to results.

## 2017-08-12 ENCOUNTER — Telehealth: Payer: Self-pay

## 2017-08-12 NOTE — Telephone Encounter (Signed)
Returned call to [place], [place] at [employer].  Left this nurse direct number for call back.

## 2017-08-12 NOTE — Telephone Encounter (Signed)
Call back received.  Megan Hester states he has investigated and cannot determine if anyone in his office advised Pt to get further tx for afib.  Josh apologized for causing the family stress. No further action needed.

## 2017-08-12 NOTE — Telephone Encounter (Signed)
[place] is calling and would like to give you a update on the patient current situation.

## 2017-08-25 ENCOUNTER — Ambulatory Visit (INDEPENDENT_AMBULATORY_CARE_PROVIDER_SITE_OTHER): Payer: Medicare Other | Admitting: *Deleted

## 2017-08-25 DIAGNOSIS — I495 Sick sinus syndrome: Secondary | ICD-10-CM

## 2017-08-25 NOTE — Progress Notes (Signed)
Remote pacemaker transmission.   

## 2017-08-26 ENCOUNTER — Encounter: Payer: Self-pay | Admitting: Cardiology

## 2017-08-26 LAB — CUP PACEART REMOTE DEVICE CHECK
Battery Remaining Longevity: 85 mo
Battery Voltage: 3.01 V
Brady Statistic AP VP Percent: 8.23 %
Brady Statistic AS VS Percent: 63.06 %
Date Time Interrogation Session: 20190716195351
Implantable Lead Implant Date: 20180115
Implantable Lead Implant Date: 20180115
Implantable Lead Location: 753860
Implantable Pulse Generator Implant Date: 20180115
Lead Channel Pacing Threshold Amplitude: 0.625 V
Lead Channel Pacing Threshold Amplitude: 0.75 V
Lead Channel Pacing Threshold Pulse Width: 0.4 ms
Lead Channel Pacing Threshold Pulse Width: 0.4 ms
Lead Channel Sensing Intrinsic Amplitude: 0.625 mV
Lead Channel Sensing Intrinsic Amplitude: 0.625 mV
Lead Channel Setting Pacing Amplitude: 2 V
Lead Channel Setting Sensing Sensitivity: 2 mV
MDC IDC LEAD LOCATION: 753859
MDC IDC MSMT LEADCHNL RA IMPEDANCE VALUE: 361 Ohm
MDC IDC MSMT LEADCHNL RA IMPEDANCE VALUE: 437 Ohm
MDC IDC MSMT LEADCHNL RV IMPEDANCE VALUE: 456 Ohm
MDC IDC MSMT LEADCHNL RV IMPEDANCE VALUE: 513 Ohm
MDC IDC MSMT LEADCHNL RV SENSING INTR AMPL: 13.125 mV
MDC IDC MSMT LEADCHNL RV SENSING INTR AMPL: 13.125 mV
MDC IDC SET LEADCHNL RV PACING AMPLITUDE: 2.5 V
MDC IDC SET LEADCHNL RV PACING PULSEWIDTH: 0.4 ms
MDC IDC STAT BRADY AP VS PERCENT: 6.27 %
MDC IDC STAT BRADY AS VP PERCENT: 22.44 %
MDC IDC STAT BRADY RA PERCENT PACED: 12.9 %
MDC IDC STAT BRADY RV PERCENT PACED: 28.35 %

## 2017-09-06 ENCOUNTER — Encounter (HOSPITAL_COMMUNITY): Payer: Self-pay | Admitting: Emergency Medicine

## 2017-09-06 ENCOUNTER — Emergency Department (HOSPITAL_COMMUNITY)
Admission: EM | Admit: 2017-09-06 | Discharge: 2017-09-07 | Disposition: A | Discharge status: Home / Self Care | Payer: Medicare Other | Attending: Emergency Medicine | Admitting: Emergency Medicine

## 2017-09-06 ENCOUNTER — Other Ambulatory Visit: Payer: Self-pay

## 2017-09-06 DIAGNOSIS — T50901A Poisoning by unspecified drugs, medicaments and biological substances, accidental (unintentional), initial encounter: Secondary | ICD-10-CM

## 2017-09-06 DIAGNOSIS — I480 Paroxysmal atrial fibrillation: Secondary | ICD-10-CM | POA: Insufficient documentation

## 2017-09-06 DIAGNOSIS — Z7901 Long term (current) use of anticoagulants: Secondary | ICD-10-CM | POA: Insufficient documentation

## 2017-09-06 DIAGNOSIS — I10 Essential (primary) hypertension: Secondary | ICD-10-CM | POA: Insufficient documentation

## 2017-09-06 DIAGNOSIS — Z950 Presence of cardiac pacemaker: Secondary | ICD-10-CM | POA: Insufficient documentation

## 2017-09-06 DIAGNOSIS — Z853 Personal history of malignant neoplasm of breast: Secondary | ICD-10-CM | POA: Diagnosis not present

## 2017-09-06 DIAGNOSIS — Z79899 Other long term (current) drug therapy: Secondary | ICD-10-CM | POA: Insufficient documentation

## 2017-09-06 DIAGNOSIS — T43591A Poisoning by other antipsychotics and neuroleptics, accidental (unintentional), initial encounter: Secondary | ICD-10-CM | POA: Insufficient documentation

## 2017-09-06 LAB — CBC WITH DIFFERENTIAL/PLATELET
BASOS PCT: 0 %
Basophils Absolute: 0 10*3/uL (ref 0.0–0.1)
Eosinophils Absolute: 0.1 10*3/uL (ref 0.0–0.7)
Eosinophils Relative: 2 %
HEMATOCRIT: 31.7 % — AB (ref 36.0–46.0)
HEMOGLOBIN: 10.9 g/dL — AB (ref 12.0–15.0)
LYMPHS PCT: 37 %
Lymphs Abs: 2.3 10*3/uL (ref 0.7–4.0)
MCH: 35.9 pg — ABNORMAL HIGH (ref 26.0–34.0)
MCHC: 34.4 g/dL (ref 30.0–36.0)
MCV: 104.3 fL — AB (ref 78.0–100.0)
MONO ABS: 0.5 10*3/uL (ref 0.1–1.0)
Monocytes Relative: 9 %
NEUTROS ABS: 3.3 10*3/uL (ref 1.7–7.7)
NEUTROS PCT: 52 %
Platelets: 211 10*3/uL (ref 150–400)
RBC: 3.04 MIL/uL — ABNORMAL LOW (ref 3.87–5.11)
RDW: 14.3 % (ref 11.5–15.5)
WBC: 6.3 10*3/uL (ref 4.0–10.5)

## 2017-09-06 LAB — BASIC METABOLIC PANEL
Anion gap: 12 (ref 5–15)
BUN: 19 mg/dL (ref 8–23)
CALCIUM: 10.1 mg/dL (ref 8.9–10.3)
CO2: 25 mmol/L (ref 22–32)
Chloride: 116 mmol/L — ABNORMAL HIGH (ref 98–111)
Creatinine, Ser: 0.73 mg/dL (ref 0.44–1.00)
GFR calc Af Amer: >60 mL/min (ref >60)
GFR calc non Af Amer: >60 mL/min (ref >60)
GLUCOSE: 107 mg/dL — AB (ref 70–99)
POTASSIUM: 4 mmol/L (ref 3.5–5.1)
SODIUM: 153 mmol/L — AB (ref 135–145)

## 2017-09-06 MED ORDER — SODIUM CHLORIDE 0.9 % IV BOLUS
1000.0000 mL | Freq: Once | INTRAVENOUS | Status: AC
  Administered 2017-09-06: 1000 mL via INTRAVENOUS

## 2017-09-06 NOTE — ED Notes (Signed)
Bed: WA06 Expected date:  Expected time:  Means of arrival:  Comments: TCI per Poison Control/[place] 84 yo female who accidentally took husband's Seroquel 25 minutes ago-Seroquel 50 mg x 3 tabs-may cause CNS depression. Poison Control states patient is drowsy-monitor for 4 hours-EKG on arrival-may cause dizziness-4 hour OBS/cardiac monitoring

## 2017-09-06 NOTE — ED Provider Notes (Signed)
[facility] DEPT Provider Note   CSN: [id number] Arrival date & time: 09/06/17  2002     History   Chief Complaint Chief Complaint  Patient presents with  . Drug Overdose    HPI Megan Hester is a 84 y.o. female who presents for evaluation of accidental ingestion of Seroquel approxi-7 PM.  Patient reports that she excellently took her husband's medication.  She states that she took 350 mg Seroquel pills at approximately 7 PM.  She states that she also took a vitamin D pill and aspirin.  Patient reports that she took her normal diltiazem and flecainide early this morning but has not taken any of her other medications.  She states that this was an accident denies any SI or intention.  Patient reports she has felt slightly tired and drowsy since taking them.  Patient denies any difficulty breathing, chest pain, numbness/weakness of her arms or legs, abdominal pain, nausea/vomiting.  Patient does have a history of proximal A. Fib.   The history is provided by the patient and a relative.    Past Medical History:  Diagnosis Date  . Arthritis    "all over my body" (02/25/2016)  . 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 GERD (gastroesophageal reflux disease)    hx; "when I was working"  . Hypertension   . Macular degeneration   . Paroxysmal atrial fibrillation (H[facility])   . Presence of permanent cardiac pacemaker   . 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 Pseudophakia of left eye 04/03/2016  . Sick sinus syndrome ([place]) 02/25/2016  . Cancer ([place])   . Shortness of breath 11/17/2011  . 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 Nuclear cataract 04/10/2011  . Age-related macular degeneration 12/03/2010  . 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 History of enucleation of right eyeball 12/03/2010  . Hypertension 09/12/2010    . A-fib ([place]) 07/16/2010    Past Surgical History:  Procedure Laterality Date  . BREAST BIOPSY Right   . BREAST LUMPECTOMY Right 1999   RADIATION  . CARDIAC CATHETERIZATION  02/05/1999   HYPERDYNAMIC LEFT VENTRICLE WITH EF OF 75-80%  . CARDIOVERSION N/A 01/22/2015   Procedure: CARDIOVERSION;  Surgeon: [name], MD;  Location: [facility] ENDOSCOPY;  Service: Cardiovascular;  Laterality: N/A;  . CARDIOVERSION N/A 02/22/2015   Procedure: CARDIOVERSION;  Surgeon: [name], MD;  Location: [facility] ENDOSCOPY;  Service: Cardiovascular;  Laterality: N/A;  . DILATION AND CURETTAGE OF UTERUS    . ENUCLEATION Right 2012  . EP IMPLANTABLE DEVICE N/A 02/25/2016   Procedure: Pacemaker Implant;  Surgeon: [name], MD;  Location: [facility] LAB;  Service: Cardiovascular;  Laterality: N/A;  . INSERT / REPLACE / REMOVE PACEMAKER    . TONSILLECTOMY       OB History    Gravida  3   Para  3   Term  3   Preterm      AB      Living  3     SAB      TAB      Ectopic      Multiple      Live Births               Home Medications    Prior to Admission medications   Medication Sig Start Date End  Date Taking? Authorizing Provider  cetirizine (ZYRTEC) 10 MG tablet Take 10 mg by mouth at bedtime.    Yes [provider]  Cholecalciferol (VITAMIN D) 1000 UNITS capsule Take 1,000 Units by mouth at bedtime.    Yes [provider]  diltiazem (CARDIZEM CD) 120 MG 24 hr capsule TAKE (1) CAPSULE TWICE DAILY. 02/09/17  Yes Allred, [name], MD  flecainide (TAMBOCOR) 100 MG tablet Take 1 tablet (100 mg total) by mouth 2 (two) times daily. 06/02/17  Yes Allred, [name], MD  Glucosamine-Chondroitin (GLUCOSAMINE CHONDR COMPLEX PO) Take 1 capsule by mouth daily.   Yes [provider]  Hypromellose (ARTIFICIAL TEARS OP) Place 1 drop into the left eye 3 (three) times daily.   Yes [provider]  Multiple Vitamins-Minerals (CENTRUM SILVER PO) Take 1 capsule by  mouth daily.    Yes [provider]  Multiple Vitamins-Minerals (PRESERVISION/LUTEIN PO) Take 1 capsule by mouth 2 (two) times daily.    Yes [provider]  Polyethyl Glycol-Propyl Glycol (SYSTANE OP) Place 1 drop into the right eye 3 (three) times daily.   Yes [provider]  rivaroxaban (XARELTO) 20 MG TABS tablet Take 1 tablet (20 mg total) by mouth daily with supper. 04/29/17  Yes Allred, [name], MD  sertraline (ZOLOFT) 100 MG tablet Take 100 mg by mouth at bedtime.  08/20/17  Yes [provider]  acetaminophen (TYLENOL) 650 MG CR tablet Take 650 mg by mouth daily as needed for pain.    [provider]  calcium carbonate (TUMS - DOSED IN MG ELEMENTAL CALCIUM) 500 MG chewable tablet Chew 1-2 tablets by mouth daily as needed for indigestion or heartburn.    [provider]  clobetasol cream (TEMOVATE) 2.44 % Apply 1 application topically daily as needed (As directed).    [provider]  fexofenadine (ALLEGRA) 180 MG tablet Take 180 mg by mouth daily as needed for allergies.     [provider]  flecainide (TAMBOCOR) 100 MG tablet TAKE 1 TABLET BY MOUTH TWICE DAILY. Patient not taking: Reported on 09/06/2017 03/03/17   Sherran Needs, NP  neomycin-polymyxin-dexameth (MAXITROL) 0.1 % OINT Place 1 application into the right eye at bedtime.    [provider]  oxybutynin (DITROPAN) 5 MG tablet Take 5 mg by mouth 3 (three) times daily as needed for bladder spasms.     [provider]  polyethylene glycol (MIRALAX / GLYCOLAX) packet Take 17 g by mouth daily as needed for moderate constipation.     [provider]  sertraline (ZOLOFT) 50 MG tablet Take 50 mg by mouth daily.    [provider]  triamcinolone cream (KENALOG) 0.1 % Apply 1 application topically daily as needed (rash).    [provider]    Family History Family History  Problem Relation Age of Onset  . Hypertension Father    . Heart attack Father   . Parkinsonism Father   . Diabetes Brother   . Heart disease Brother   . Lung cancer Brother   . Stroke Mother     Social History Social History   Tobacco Use  . Smoking status: Never Smoker  . Smokeless tobacco: Never Used  Substance Use Topics  . Alcohol use: Yes    Alcohol/week: 0.0 oz    Comment: 02/25/2016 "glass of wine/month"  . Drug use: No     Allergies   Shellfish allergy; Atropine; Codeine; Contrast media [iodinated diagnostic agents]; Doxycycline; Fenoprofen calcium; Isopto hyoscine [scopolamine]; Levaquin [levofloxacin];  Other; Oxycodone; Penicillins; Sulfa drugs cross reactors; Warfarin; Ceclor [cefaclor]; and Tape   Review of Systems Review of Systems  Constitutional: Positive for fatigue. Negative for fever.  Respiratory: Negative for cough and shortness of breath.   Cardiovascular: Negative for chest pain.  Gastrointestinal: Negative for abdominal pain, nausea and vomiting.  Neurological: Negative for headaches.  Psychiatric/Behavioral: Negative for self-injury and suicidal ideas.  All other systems reviewed and are negative.    Physical Exam Updated Vital Signs BP 128/82   Pulse 81   Temp 98.7 F (37.1 C) (Oral)   Resp 17   Ht 5\' 4"  (1.626 m)   Wt 68 kg (150 lb)   SpO2 96%   BMI 25.75 kg/m   Physical Exam  Constitutional: She is oriented to person, place, and time. She appears well-developed and well-nourished.  Sitting comfortably on examination table  HENT:  Head: Normocephalic and atraumatic.  Mouth/Throat: Oropharynx is clear and moist and mucous membranes are normal.  Eyes: Pupils are equal, round, and reactive to light. Conjunctivae, EOM and lids are normal.  Neck: Full passive range of motion without pain.  Cardiovascular: Normal rate, normal heart sounds and normal pulses. An irregularly irregular rhythm present. Exam reveals no gallop and no friction rub.  No murmur heard. Pulses:      Radial pulses are  2+ on the right side, and 2+ on the left side.       Dorsalis pedis pulses are 2+ on the right side, and 2+ on the left side.  Pulmonary/Chest: Effort normal and breath sounds normal.  Abdominal: Soft. Normal appearance. There is no tenderness. There is no rigidity and no guarding.  Musculoskeletal: Normal range of motion.  Neurological: She is alert and oriented to person, place, and time.  Follows commands, Moves all extremities  5/5 strength to BUE and BLE  Sensation intact throughout all major nerve distributions  Skin: Skin is warm and dry. Capillary refill takes less than 2 seconds.  Psychiatric: She has a normal mood and affect. Her speech is normal.  Nursing note and vitals reviewed.    ED Treatments / Results  Labs (all labs ordered are listed, but only abnormal results are displayed) Labs Reviewed  BASIC METABOLIC PANEL - Abnormal; Notable for the following components:      Result Value   Sodium 153 (*)    Chloride 116 (*)    Glucose, Bld 107 (*)    All other components within normal limits  CBC WITH DIFFERENTIAL/PLATELET - Abnormal; Notable for the following components:   RBC 3.04 (*)    Hemoglobin 10.9 (*)    HCT 31.7 (*)    MCV 104.3 (*)    MCH 35.9 (*)    All other components within normal limits  MAGNESIUM    EKG EKG Interpretation  Date/Time:  Sunday September 06 2017 20:13:44 EDT Ventricular Rate:  97 PR Interval:    QRS Duration: 156 QT Interval:  398 QTC Calculation: 506 R Axis:   64 Text Interpretation:  Atrial fibrillation Right bundle branch block Non-specific ST-t changes Confirmed by Lajean Saver [phone]) on 09/06/2017 11:13:25 PM   Radiology No results found.  Procedures Procedures (including critical care time)  Medications Ordered in ED Medications  sodium chloride 0.9 % bolus 1,000 mL (0 mLs Intravenous Stopped 09/07/17 0044)     Initial Impression / Assessment and Plan / ED Course  I have reviewed the triage vital signs and the  nursing notes.  Pertinent labs & imaging results  that were available during my care of the patient were reviewed by me and considered in my medical decision making (see chart for details).     84 year old female who presents for evaluation of accidental ingestion of Seroquel approxi-7 PM.  Took her husband's medication and reports taking 350 mg Seroquel pills.  Reports that the only other medication she took today with flecainide and diltiazem.  Reports feeling tired but no chest pain, difficulty breathing, nausea/vomiting. Patient is afebrile, non-toxic appearing, sitting comfortably on examination table. Vital signs reviewed and stable.  On exam, patient is regularly irregular.  She does have a history of paroxysmal A. Fib.  No neuro deficits noted.  We will plan to check basic labs, contact poison control.  Discussed patient with Poison Control.  They recommend observing patient 4 hours in the ED.  They recommend obtaining EKG, CBC, BMP, mag.  No other orders needed at this time.  EKG: HR 97, QTC: 506,QRS 156.  Review of patient's previous EKG shows she has had previous QTC and QRS prolongation before.  CBC shows no leukocytosis.  Hemoglobin is 10.9 and hematocrit is 31.7.  Sodium is 153.  Chloride is 116.  Potassium is 4.0 Otherwise unremarkable.  Discussed patient with Dr. Ashok Cordia after he independently evaluated patient.  We will plan to give fluids for hypernatremia.  Will need repeat labs per primary care doctor in the next 24 to 48 hours for further evaluation.  Repeat EKG shows sinus rhythm, rate 82.  Does not appear irregular.  QRS is 162 and QTC is 526.  Patient had a previous EKG showed slight prolongation QTC and QRS.  Discussed with poison control regarding work-up and EKG findings.  Vital signs are stable.  Given repeat EKG, feel that patient is safe to discharge home.  They do not feel like there is any added risk given her pacemaker and that if there is can be any EKG changes or if  shown in the first 4 hours.  No further work-up indicated in the ED.  Discussed results with patient.  Patient reports that she is sleepy but is otherwise has no complaints.  Vital signs are stable.  Patient has been ambulating in the ED without any difficulty.  Patient stable for discharge at this time.  Instructed patient follow-up with her primary care doctor the next 2440 hrs. to have her labs rechecked. Patient had ample opportunity for questions and discussion. All patient's questions were answered with full understanding. Strict return precautions discussed. Patient expresses understanding and agreement to plan.   .   Final Clinical Impressions(s) / ED Diagnoses   Final diagnoses:  Acute drug overdose, accidental or unintentional, initial encounter    ED Discharge Orders    None       Desma Mcgregor 09/07/17 0107    Lajean Saver, MD 09/09/17 1818

## 2017-09-06 NOTE — ED Triage Notes (Signed)
Pt presents from home for accidental ingestion of spouse's Seroquel at 1900. Son reports that pt took appx 150mg .

## 2017-09-07 NOTE — Discharge Instructions (Signed)
As we discussed today, your sodium was slightly high and your hemoglobin is slightly low.  Please follow-up with your primary care doctor in the next 24 to 48 hours to have these labs rechecked to make sure that they are normal.  Do not take any medications is not your own.  Return to emergency department for any chest pain, difficulty breathing, abdominal pain, nausea/vomiting or any other worsening or concerning symptoms.

## 2017-09-15 ENCOUNTER — Telehealth: Payer: Self-pay

## 2017-09-15 NOTE — Telephone Encounter (Signed)
Pt called stating she went thru a metal detector and now she not feeling well. Pt wanted to know should she just lie down. I asked the pt to send a transmission with her home monitor and let her talk with the device tech nurse.

## 2017-09-15 NOTE — Telephone Encounter (Signed)
Spoke with pt informed her that her pacemaker functioning normally, and the only episodes were atrial fibrillation which is known. Pt voiced understanding and appreciative of call back and stated that made her feel better

## 2017-11-03 ENCOUNTER — Encounter: Payer: Self-pay | Admitting: Internal Medicine

## 2017-11-05 ENCOUNTER — Encounter: Payer: Medicare Other | Admitting: Nurse Practitioner

## 2017-11-18 ENCOUNTER — Encounter: Payer: Self-pay | Admitting: Internal Medicine

## 2017-11-18 ENCOUNTER — Ambulatory Visit: Payer: Medicare Other | Admitting: Internal Medicine

## 2017-11-18 VITALS — BP 122/74 | HR 72 | Ht 64.0 in | Wt 156.0 lb

## 2017-11-18 DIAGNOSIS — I484 Atypical atrial flutter: Secondary | ICD-10-CM

## 2017-11-18 DIAGNOSIS — I480 Paroxysmal atrial fibrillation: Secondary | ICD-10-CM

## 2017-11-18 DIAGNOSIS — I495 Sick sinus syndrome: Secondary | ICD-10-CM

## 2017-11-18 DIAGNOSIS — I10 Essential (primary) hypertension: Secondary | ICD-10-CM | POA: Diagnosis not present

## 2017-11-18 NOTE — Patient Instructions (Signed)
Medication Instructions:  Your physician recommends that you continue on your current medications as directed. Please refer to the Current Medication list given to you today.  If you need a refill on your cardiac medications before your next appointment, please call your pharmacy.   Lab work: None Ordered  If you have labs (blood work) drawn today and your tests are completely normal, you will receive your results only by: [name] Kitchen MyChart Message (if you have MyChart) OR . A paper copy in the mail If you have any lab test that is abnormal or we need to change your treatment, we will call you to review the results.  Testing/Procedures: None ordered  Follow-Up: Remote monitoring is used to monitor your Pacemaker from home. This monitoring reduces the number of office visits required to check your device to one time per year. It allows [place] to keep an eye on the functioning of your device to ensure it is working properly. You are scheduled for a device check from home on 11/24/17. You may send your transmission at any time that day. If you have a wireless device, the transmission will be sent automatically. After your physician reviews your transmission, you will receive a postcard with your next transmission date. .  . Your physician recommends that you schedule a follow-up appointment in: 4 months with Dr. Rayann Heman  Any Other Special Instructions Will Be Listed Below (If Applicable).

## 2017-11-18 NOTE — Progress Notes (Signed)
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  Since last being seen in our clinic, the patient reports doing very well.  She is now persistently in atypical atrial flutter but mostly unaware.  Energy is good.  No concerns today.  Today, she denies symptoms of palpitations, chest pain, shortness of breath,  lower extremity edema, dizziness, presyncope, or syncope.  The patient is otherwise without complaint today.   Past Medical History:  Diagnosis Date  . Arthritis    "all over my body" (02/25/2016)  . 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 GERD (gastroesophageal reflux disease)    hx; "when I was working"  . Hypertension   . Macular degeneration   . Paroxysmal atrial fibrillation (HCC)   . Presence of permanent cardiac pacemaker   . 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 BREAST BIOPSY Right   . BREAST LUMPECTOMY Right 1999   RADIATION  . CARDIAC CATHETERIZATION  02/05/1999   HYPERDYNAMIC LEFT VENTRICLE WITH EF OF 75-80%  . CARDIOVERSION N/A 01/22/2015   Procedure: CARDIOVERSION;  Surgeon: [name], MD;  Location: [facility] ENDOSCOPY;  Service: Cardiovascular;  Laterality: N/A;  . CARDIOVERSION N/A 02/22/2015   Procedure: CARDIOVERSION;  Surgeon: [name], MD;  Location: [facility] ENDOSCOPY;  Service: Cardiovascular;  Laterality: N/A;  . DILATION AND CURETTAGE OF UTERUS    . ENUCLEATION Right 2012  . EP IMPLANTABLE DEVICE N/A 02/25/2016   Procedure: Pacemaker Implant;  Surgeon: [name], MD;  Location: [facility] LAB;  Service: Cardiovascular;  Laterality: N/A;  . INSERT / REPLACE / REMOVE PACEMAKER    . TONSILLECTOMY      ROS- all systems are reviewed and negative except as per HPI above  Current Outpatient Medications  Medication Sig Dispense Refill  . acetaminophen (TYLENOL) 650  MG CR tablet Take 650 mg by mouth daily as needed for pain.    . calcium carbonate (TUMS - DOSED IN MG ELEMENTAL CALCIUM) 500 MG chewable tablet Chew 1-2 tablets by mouth daily as needed for indigestion or heartburn.    . cetirizine (ZYRTEC) 10 MG tablet Take 10 mg by mouth at bedtime.     . Cholecalciferol (VITAMIN D) 1000 UNITS capsule Take 1,000 Units by mouth at bedtime.     . clobetasol cream (TEMOVATE) 0.16 % Apply 1 application topically daily as needed (As directed).    [name] Kitchen diltiazem (CARDIZEM CD) 120 MG 24 hr capsule TAKE (1) CAPSULE TWICE DAILY. 60 capsule 11  . fexofenadine (ALLEGRA) 180 MG tablet Take 180 mg by mouth daily as needed for allergies.     . flecainide (TAMBOCOR) 100 MG tablet TAKE 1 TABLET BY MOUTH TWICE DAILY. 180 tablet 0  . flecainide (TAMBOCOR) 100 MG tablet Take 1 tablet (100 mg total) by mouth 2 (two) times daily. 180 tablet 3  . Glucosamine-Chondroitin (GLUCOSAMINE CHONDR COMPLEX PO) Take 1 capsule by mouth daily.    . Hypromellose (ARTIFICIAL TEARS OP) Place 1 drop into the left eye 3 (three) times daily.    . Multiple Vitamins-Minerals (CENTRUM SILVER PO) Take 1 capsule by mouth daily.     . Multiple Vitamins-Minerals (PRESERVISION/LUTEIN PO) Take 1 capsule by mouth 2 (two) times daily.     [name] Kitchen neomycin-polymyxin-dexameth (MAXITROL) 0.1 % OINT Place 1  application into the right eye at bedtime.    [name] Kitchen oxybutynin (DITROPAN) 5 MG tablet Take 5 mg by mouth 3 (three) times daily as needed for bladder spasms.     Vladimir Faster Glycol-Propyl Glycol (SYSTANE OP) Place 1 drop into the right eye 3 (three) times daily.    . polyethylene glycol (MIRALAX / GLYCOLAX) packet Take 17 g by mouth daily as needed for moderate constipation.     . rivaroxaban (XARELTO) 20 MG TABS tablet Take 1 tablet (20 mg total) by mouth daily with supper. 30 tablet 6  . sertraline (ZOLOFT) 100 MG tablet Take 100 mg by mouth at bedtime.     . sertraline (ZOLOFT) 50 MG tablet Take 50 mg by mouth daily.    [name] Kitchen  triamcinolone cream (KENALOG) 0.1 % Apply 1 application topically daily as needed (rash).     No current facility-administered medications for this visit.     Physical Exam: Vitals:   11/18/17 1613  BP: 122/74  Pulse: 72  SpO2: 97%  Weight: 156 lb (70.8 kg)  Height: 5\' 4"  (1.626 m)    GEN- The patient is well appearing, alert and oriented x 3 today.   Head- normocephalic, atraumatic Eyes-  Sclera clear, conjunctiva pink Ears- hearing intact Oropharynx- clear Lungs- Clear to ausculation bilaterally, normal work of breathing Chest- pacemaker pocket is well healed Heart- irregular rate and rhythm  GI- soft, NT, ND, + BS Extremities- no clubbing, cyanosis, or edema  Pacemaker interrogation- reviewed in detail today,  See PACEART report  ekg tracing ordered today is personally reviewed and shows atypical atrial flutter  Assessment and Plan:  1. Symptomatic sinus bradycardia  Normal pacemaker function See Pace Art report No changes today  2. Persistent afib/ atypical atrial flutter She is in atypical atrial flutter today but unaware.  Her burden is 75% today (31% last visit) She remains asymptomatic and rate controlled She wishes to avoid AADs On xarelto I have turned on atrial therapies.   3. HTN Stable No change required today  carelink Return in 4 months  [name] MD, [facility] 11/18/2017 4:30 PM

## 2017-11-23 LAB — CUP PACEART INCLINIC DEVICE CHECK
Implantable Lead Implant Date: 20180115
Implantable Lead Location: 753859
Implantable Lead Model: 5076
Implantable Lead Model: 5076
Implantable Pulse Generator Implant Date: 20180115
MDC IDC LEAD IMPLANT DT: 20180115
MDC IDC LEAD LOCATION: 753860
MDC IDC SESS DTM: 20191014092214

## 2017-11-24 ENCOUNTER — Telehealth: Payer: Self-pay | Admitting: Cardiology

## 2017-11-24 ENCOUNTER — Encounter: Payer: Medicare Other | Admitting: *Deleted

## 2017-11-24 NOTE — Telephone Encounter (Signed)
Spoke with pt and reminded pt of remote transmission that is due today. Pt verbalized understanding.   

## 2017-11-26 ENCOUNTER — Encounter: Payer: Self-pay | Admitting: Cardiology

## 2017-11-30 ENCOUNTER — Ambulatory Visit (INDEPENDENT_AMBULATORY_CARE_PROVIDER_SITE_OTHER): Payer: Medicare Other | Admitting: *Deleted

## 2017-11-30 DIAGNOSIS — I495 Sick sinus syndrome: Secondary | ICD-10-CM | POA: Diagnosis not present

## 2017-11-30 NOTE — Progress Notes (Signed)
Remote pacemaker transmission.   

## 2017-12-22 LAB — CUP PACEART REMOTE DEVICE CHECK
Battery Remaining Longevity: 74 mo
Battery Voltage: 3 V
Brady Statistic AP VS Percent: 42.34 %
Brady Statistic AS VS Percent: 34.4 %
Date Time Interrogation Session: 20191021190402
Implantable Lead Implant Date: 20180115
Implantable Lead Location: 753859
Implantable Lead Location: 753860
Implantable Lead Model: 5076
Implantable Pulse Generator Implant Date: 20180115
Lead Channel Pacing Threshold Amplitude: 0.625 V
Lead Channel Pacing Threshold Pulse Width: 0.4 ms
Lead Channel Sensing Intrinsic Amplitude: 1 mV
Lead Channel Sensing Intrinsic Amplitude: 1 mV
Lead Channel Sensing Intrinsic Amplitude: 14 mV
Lead Channel Setting Pacing Amplitude: 2.5 V
Lead Channel Setting Sensing Sensitivity: 2 mV
MDC IDC LEAD IMPLANT DT: 20180115
MDC IDC MSMT LEADCHNL RA IMPEDANCE VALUE: 342 Ohm
MDC IDC MSMT LEADCHNL RA IMPEDANCE VALUE: 399 Ohm
MDC IDC MSMT LEADCHNL RV IMPEDANCE VALUE: 437 Ohm
MDC IDC MSMT LEADCHNL RV IMPEDANCE VALUE: 494 Ohm
MDC IDC MSMT LEADCHNL RV PACING THRESHOLD AMPLITUDE: 0.875 V
MDC IDC MSMT LEADCHNL RV PACING THRESHOLD PULSEWIDTH: 0.4 ms
MDC IDC MSMT LEADCHNL RV SENSING INTR AMPL: 14 mV
MDC IDC SET LEADCHNL RA PACING AMPLITUDE: 2 V
MDC IDC SET LEADCHNL RV PACING PULSEWIDTH: 0.4 ms
MDC IDC STAT BRADY AP VP PERCENT: 9.68 %
MDC IDC STAT BRADY AS VP PERCENT: 13.58 %
MDC IDC STAT BRADY RA PERCENT PACED: 46.79 %
MDC IDC STAT BRADY RV PERCENT PACED: 22.31 %

## 2018-03-01 ENCOUNTER — Ambulatory Visit (INDEPENDENT_AMBULATORY_CARE_PROVIDER_SITE_OTHER): Payer: Medicare Other

## 2018-03-01 ENCOUNTER — Other Ambulatory Visit: Payer: Self-pay | Admitting: Internal Medicine

## 2018-03-01 DIAGNOSIS — I495 Sick sinus syndrome: Secondary | ICD-10-CM | POA: Diagnosis not present

## 2018-03-02 LAB — CUP PACEART REMOTE DEVICE CHECK
Battery Remaining Longevity: 63 mo
Brady Statistic AP VS Percent: 44.67 %
Brady Statistic AS VS Percent: 39 %
Date Time Interrogation Session: 20200120215013
Implantable Lead Implant Date: 20180115
Implantable Lead Location: 753859
Implantable Pulse Generator Implant Date: 20180115
Lead Channel Impedance Value: 437 Ohm
Lead Channel Pacing Threshold Amplitude: 0.875 V
Lead Channel Sensing Intrinsic Amplitude: 0.625 mV
Lead Channel Sensing Intrinsic Amplitude: 0.625 mV
Lead Channel Sensing Intrinsic Amplitude: 14.375 mV
Lead Channel Sensing Intrinsic Amplitude: 14.375 mV
Lead Channel Setting Pacing Amplitude: 2.5 V
Lead Channel Setting Pacing Pulse Width: 0.4 ms
MDC IDC LEAD IMPLANT DT: 20180115
MDC IDC LEAD LOCATION: 753860
MDC IDC MSMT BATTERY VOLTAGE: 2.99 V
MDC IDC MSMT LEADCHNL RA IMPEDANCE VALUE: 361 Ohm
MDC IDC MSMT LEADCHNL RA PACING THRESHOLD AMPLITUDE: 0.625 V
MDC IDC MSMT LEADCHNL RA PACING THRESHOLD PULSEWIDTH: 0.4 ms
MDC IDC MSMT LEADCHNL RV IMPEDANCE VALUE: 456 Ohm
MDC IDC MSMT LEADCHNL RV IMPEDANCE VALUE: 513 Ohm
MDC IDC MSMT LEADCHNL RV PACING THRESHOLD PULSEWIDTH: 0.4 ms
MDC IDC SET LEADCHNL RA PACING AMPLITUDE: 2 V
MDC IDC SET LEADCHNL RV SENSING SENSITIVITY: 2 mV
MDC IDC STAT BRADY AP VP PERCENT: 5.1 %
MDC IDC STAT BRADY AS VP PERCENT: 11.23 %
MDC IDC STAT BRADY RA PERCENT PACED: 44.98 %
MDC IDC STAT BRADY RV PERCENT PACED: 15.08 %

## 2018-03-02 NOTE — Progress Notes (Signed)
Remote pacemaker transmission.   

## 2018-03-03 ENCOUNTER — Other Ambulatory Visit: Payer: Self-pay | Admitting: Internal Medicine

## 2018-03-03 ENCOUNTER — Ambulatory Visit
Admission: RE | Admit: 2018-03-03 | Discharge: 2018-03-03 | Disposition: A | Discharge status: Home / Self Care | Payer: Medicare Other | Source: Ambulatory Visit | Attending: Internal Medicine | Admitting: Internal Medicine

## 2018-03-03 DIAGNOSIS — Z1231 Encounter for screening mammogram for malignant neoplasm of breast: Secondary | ICD-10-CM

## 2018-03-24 ENCOUNTER — Encounter: Payer: Self-pay | Admitting: Internal Medicine

## 2018-03-24 ENCOUNTER — Ambulatory Visit: Payer: Medicare Other | Admitting: Internal Medicine

## 2018-03-24 VITALS — BP 110/70 | HR 78 | Ht 64.0 in | Wt 156.8 lb

## 2018-03-24 DIAGNOSIS — I495 Sick sinus syndrome: Secondary | ICD-10-CM | POA: Diagnosis not present

## 2018-03-24 DIAGNOSIS — I480 Paroxysmal atrial fibrillation: Secondary | ICD-10-CM | POA: Diagnosis not present

## 2018-03-24 DIAGNOSIS — I484 Atypical atrial flutter: Secondary | ICD-10-CM

## 2018-03-24 DIAGNOSIS — Z950 Presence of cardiac pacemaker: Secondary | ICD-10-CM

## 2018-03-24 DIAGNOSIS — I10 Essential (primary) hypertension: Secondary | ICD-10-CM

## 2018-03-24 NOTE — Patient Instructions (Addendum)
Medication Instructions:  Your physician recommends that you continue on your current medications as directed. Please refer to the Current Medication list given to you today.  Labwork: None ordered.  Testing/Procedures: None ordered.  Follow-Up: Your physician wants you to follow-up in: 6 months with Chanetta Marshall, NP.   You will receive a reminder letter in the mail two months in advance. If you don't receive a letter, please call our office to schedule the follow-up appointment.  Remote monitoring is used to monitor your Pacemaker from home. This monitoring reduces the number of office visits required to check your device to one time per year. It allows [place] to keep an eye on the functioning of your device to ensure it is working properly. You are scheduled for a device check from home on 05/31/2018. You may send your transmission at any time that day. If you have a wireless device, the transmission will be sent automatically. After your physician reviews your transmission, you will receive a postcard with your next transmission date.  Any Other Special Instructions Will Be Listed Below (If Applicable).  If you need a refill on your cardiac medications before your next appointment, please call your pharmacy.

## 2018-03-24 NOTE — Progress Notes (Signed)
PCP: [name], MD   Primary EP:  Dr Aurea Graff Megan Hester is a 85 y.o. female who presents today for routine electrophysiology followup.  Since last being seen in our clinic, the patient reports doing very well.  Her husband has advanced dementia.  She struggles to care for him.  Today, she denies symptoms of palpitations, chest pain, shortness of breath,  lower extremity edema, dizziness, presyncope, or syncope.  The patient is otherwise without complaint today.   Past Medical History:  Diagnosis Date  . Arthritis    "all over my body" (02/25/2016)  . 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 GERD (gastroesophageal reflux disease)    hx; "when I was working"  . Hypertension   . Macular degeneration   . Paroxysmal atrial fibrillation (HCC)   . Presence of permanent cardiac pacemaker   . 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 BREAST BIOPSY Right   . BREAST LUMPECTOMY Right 1999   RADIATION  . CARDIAC CATHETERIZATION  02/05/1999   HYPERDYNAMIC LEFT VENTRICLE WITH EF OF 75-80%  . CARDIOVERSION N/A 01/22/2015   Procedure: CARDIOVERSION;  Surgeon: [name], MD;  Location: [facility] ENDOSCOPY;  Service: Cardiovascular;  Laterality: N/A;  . CARDIOVERSION N/A 02/22/2015   Procedure: CARDIOVERSION;  Surgeon: [name], MD;  Location: [facility] ENDOSCOPY;  Service: Cardiovascular;  Laterality: N/A;  . DILATION AND CURETTAGE OF UTERUS    . ENUCLEATION Right 2012  . EP IMPLANTABLE DEVICE N/A 02/25/2016   Procedure: Pacemaker Implant;  Surgeon: [name], MD;  Location: [facility] CV LAB;  Service: Cardiovascular;  Laterality: N/A;  . INSERT / REPLACE / REMOVE PACEMAKER    . TONSILLECTOMY      ROS- all systems are reviewed and negative except as per HPI above  Current Outpatient Medications  Medication Sig Dispense Refill  . acetaminophen (TYLENOL) 650 MG CR tablet Take 650 mg by mouth  daily as needed for pain.    . calcium carbonate (TUMS - DOSED IN MG ELEMENTAL CALCIUM) 500 MG chewable tablet Chew 1-2 tablets by mouth daily as needed for indigestion or heartburn.    . cetirizine (ZYRTEC) 10 MG tablet Take 10 mg by mouth at bedtime.     . Cholecalciferol (VITAMIN D) 1000 UNITS capsule Take 1,000 Units by mouth at bedtime.     . clobetasol cream (TEMOVATE) 3.88 % Apply 1 application topically daily as needed (As directed).    [name] Kitchen diltiazem (CARDIZEM CD) 120 MG 24 hr capsule TAKE (1) CAPSULE TWICE DAILY. 60 capsule 8  . fexofenadine (ALLEGRA) 180 MG tablet Take 180 mg by mouth daily as needed for allergies.     . flecainide (TAMBOCOR) 100 MG tablet TAKE 1 TABLET BY MOUTH TWICE DAILY. 180 tablet 0  . flecainide (TAMBOCOR) 100 MG tablet Take 1 tablet (100 mg total) by mouth 2 (two) times daily. 180 tablet 3  . Glucosamine-Chondroitin (GLUCOSAMINE CHONDR COMPLEX PO) Take 1 capsule by mouth daily.    . Hypromellose (ARTIFICIAL TEARS OP) Place 1 drop into the left eye 3 (three) times daily.    . Multiple Vitamins-Minerals (CENTRUM SILVER PO) Take 1 capsule by mouth daily.     . Multiple Vitamins-Minerals (PRESERVISION/LUTEIN PO) Take 1 capsule by mouth 2 (two) times daily.     [name] Kitchen neomycin-polymyxin-dexameth (MAXITROL) 0.1 % OINT Place 1 application into the right eye at bedtime.    [name] Kitchen  oxybutynin (DITROPAN) 5 MG tablet Take 5 mg by mouth 3 (three) times daily as needed for bladder spasms.     Vladimir Faster Glycol-Propyl Glycol (SYSTANE OP) Place 1 drop into the right eye 3 (three) times daily.    . polyethylene glycol (MIRALAX / GLYCOLAX) packet Take 17 g by mouth daily as needed for moderate constipation.     . rivaroxaban (XARELTO) 20 MG TABS tablet Take 1 tablet (20 mg total) by mouth daily with supper. 30 tablet 6  . sertraline (ZOLOFT) 100 MG tablet Take 100 mg by mouth at bedtime.     . sertraline (ZOLOFT) 50 MG tablet Take 50 mg by mouth daily.    [name] Kitchen triamcinolone cream (KENALOG) 0.1  % Apply 1 application topically daily as needed (rash).     No current facility-administered medications for this visit.     Physical Exam: Vitals:   03/24/18 1505  BP: 110/70  Pulse: 78  SpO2: 97%  Weight: 156 lb 12.8 oz (71.1 kg)  Height: 5\' 4"  (1.626 m)    GEN- The patient is well appearing, alert and oriented x 3 today.   Head- normocephalic, atraumatic Eyes-  Sclera clear, conjunctiva pink Ears- hearing intact Oropharynx- clear Lungs- Clear to ausculation bilaterally, normal work of breathing Chest- pacemaker pocket is well healed Heart- Regular rate and rhythm, no murmurs, rubs or gallops, PMI not laterally displaced GI- soft, NT, ND, + BS Extremities- no clubbing, cyanosis, or edema  Pacemaker interrogation- reviewed in detail today,  See PACEART report  ekg tracing ordered today is personally reviewed and shows atypical atrial flutter, RBBB, demand V pacing  Assessment and Plan:  1. Symptomatic sinus bradycardia  Normal pacemaker function See Pace Art report Atrial therapies have not been effective.  I have therefore turned them off today  2. Persistent afib/ atypical atrial flutter Asymptomatic Overall burden is 46% (75% last visit) Continue xarelto She is on flecainide.  We will stop this if her arrhythmia burden increases.  She is not interested in additional AADs or ablation.  3. HTN Stable No change required today  Carelink Return to see EP NP in 6 months  [name] MD, [facility] 03/24/2018 3:47 PM

## 2018-03-28 ENCOUNTER — Other Ambulatory Visit: Payer: Self-pay | Admitting: Internal Medicine

## 2018-05-31 ENCOUNTER — Ambulatory Visit (INDEPENDENT_AMBULATORY_CARE_PROVIDER_SITE_OTHER): Payer: Medicare Other | Admitting: *Deleted

## 2018-05-31 ENCOUNTER — Other Ambulatory Visit: Payer: Self-pay

## 2018-05-31 DIAGNOSIS — I495 Sick sinus syndrome: Secondary | ICD-10-CM | POA: Diagnosis not present

## 2018-05-31 LAB — CUP PACEART REMOTE DEVICE CHECK
Battery Remaining Longevity: 85 mo
Battery Voltage: 3.01 V
Brady Statistic AP VP Percent: 6.87 %
Brady Statistic AP VS Percent: 58.73 %
Brady Statistic AS VP Percent: 9.56 %
Brady Statistic AS VS Percent: 24.84 %
Brady Statistic RA Percent Paced: 61.32 %
Brady Statistic RV Percent Paced: 15.52 %
Date Time Interrogation Session: 20200420204215
Implantable Lead Implant Date: 20180115
Implantable Lead Implant Date: 20180115
Implantable Lead Location: 753859
Implantable Lead Location: 753860
Implantable Lead Model: 5076
Implantable Lead Model: 5076
Implantable Pulse Generator Implant Date: 20180115
Lead Channel Impedance Value: 361 Ohm
Lead Channel Impedance Value: 437 Ohm
Lead Channel Impedance Value: 437 Ohm
Lead Channel Impedance Value: 494 Ohm
Lead Channel Pacing Threshold Amplitude: 0.625 V
Lead Channel Pacing Threshold Amplitude: 1 V
Lead Channel Pacing Threshold Pulse Width: 0.4 ms
Lead Channel Pacing Threshold Pulse Width: 0.4 ms
Lead Channel Sensing Intrinsic Amplitude: 1 mV
Lead Channel Sensing Intrinsic Amplitude: 1 mV
Lead Channel Sensing Intrinsic Amplitude: 13 mV
Lead Channel Sensing Intrinsic Amplitude: 13 mV
Lead Channel Setting Pacing Amplitude: 2 V
Lead Channel Setting Pacing Amplitude: 2.5 V
Lead Channel Setting Pacing Pulse Width: 0.4 ms
Lead Channel Setting Sensing Sensitivity: 2 mV

## 2018-06-08 ENCOUNTER — Encounter: Payer: Self-pay | Admitting: Cardiology

## 2018-06-08 NOTE — Progress Notes (Signed)
Remote pacemaker transmission.   

## 2018-06-28 ENCOUNTER — Other Ambulatory Visit: Payer: Self-pay | Admitting: Internal Medicine

## 2018-08-30 ENCOUNTER — Ambulatory Visit (INDEPENDENT_AMBULATORY_CARE_PROVIDER_SITE_OTHER): Payer: Medicare Other | Admitting: *Deleted

## 2018-08-30 DIAGNOSIS — I495 Sick sinus syndrome: Secondary | ICD-10-CM

## 2018-08-30 LAB — CUP PACEART REMOTE DEVICE CHECK
Battery Remaining Longevity: 86 mo
Battery Voltage: 3.01 V
Brady Statistic AP VP Percent: 3.69 %
Brady Statistic AP VS Percent: 81.6 %
Brady Statistic AS VP Percent: 3.81 %
Brady Statistic AS VS Percent: 10.91 %
Brady Statistic RA Percent Paced: 82.49 %
Brady Statistic RV Percent Paced: 7.26 %
Date Time Interrogation Session: 20200720163447
Implantable Lead Implant Date: 20180115
Implantable Lead Implant Date: 20180115
Implantable Lead Location: 753859
Implantable Lead Location: 753860
Implantable Lead Model: 5076
Implantable Lead Model: 5076
Implantable Pulse Generator Implant Date: 20180115
Lead Channel Impedance Value: 342 Ohm
Lead Channel Impedance Value: 418 Ohm
Lead Channel Impedance Value: 418 Ohm
Lead Channel Impedance Value: 456 Ohm
Lead Channel Pacing Threshold Amplitude: 0.625 V
Lead Channel Pacing Threshold Amplitude: 1 V
Lead Channel Pacing Threshold Pulse Width: 0.4 ms
Lead Channel Pacing Threshold Pulse Width: 0.4 ms
Lead Channel Sensing Intrinsic Amplitude: 0.5 mV
Lead Channel Sensing Intrinsic Amplitude: 0.5 mV
Lead Channel Sensing Intrinsic Amplitude: 11.75 mV
Lead Channel Sensing Intrinsic Amplitude: 11.75 mV
Lead Channel Setting Pacing Amplitude: 2 V
Lead Channel Setting Pacing Amplitude: 2.5 V
Lead Channel Setting Pacing Pulse Width: 0.4 ms
Lead Channel Setting Sensing Sensitivity: 2 mV

## 2018-09-14 ENCOUNTER — Encounter: Payer: Self-pay | Admitting: Cardiology

## 2018-09-14 NOTE — Progress Notes (Signed)
Remote pacemaker transmission.   

## 2018-09-25 ENCOUNTER — Other Ambulatory Visit: Payer: Self-pay | Admitting: Internal Medicine

## 2018-09-27 NOTE — Telephone Encounter (Signed)
36f 71.1kg Scr 0.8 08/09/18 Lovw/allred 03/24/18

## 2018-10-06 ENCOUNTER — Telehealth: Payer: Self-pay | Admitting: Internal Medicine

## 2018-10-06 NOTE — Telephone Encounter (Signed)
New Message    Pt c/o BP issue: STAT if pt c/o blurred vision, one-sided weakness or slurred speech  1. What are your last 5 BP readings? 105/66, 82/104, 93/63 all the BP's she has for today.  2. Are you having any other symptoms (ex. Dizziness, headache, blurred vision, passed out)? Vertigo   3. What is your BP issue? Patient's BP has been consistent and would like to speak to a nurse.

## 2018-10-06 NOTE — Telephone Encounter (Signed)
Call returned to Pt.  Pt with low BP's today.  C/o of some vertigo this week, but states she also has some sinus issues.  Pt states she has been drinking water regularly to stay hydrated.  Pt takes diltiazem 120 mg CD BID.    Advised Pt to hold diltiazem tonight.  Advised to take her BP in the morning before she takes her AM diltiazem and to write it down.  Advised Pt this  Nurse would call her in the morning and further recommendation after she records her morning BP.  Pt indicates understanding and thanked nurse for call.  Will follow up 10/07/2018 in the AM.

## 2018-10-07 NOTE — Telephone Encounter (Signed)
Returned call to Pt.  Per Pt she feels better this morning, less dizzy.  BP this AM 98/68 and 101/79.  Pulse readings 111 and 66.  Will forward to [place] and AS for further recommendations.

## 2018-10-11 NOTE — Telephone Encounter (Signed)
Encourage adequate hydration and reduce diltiazem CD 120mg  to daily from BID

## 2018-10-12 MED ORDER — DILTIAZEM HCL ER COATED BEADS 120 MG PO CP24: 120.0000 mg | ORAL_CAPSULE | Freq: Every day | ORAL | 8 refills | Status: DC

## 2018-10-12 NOTE — Telephone Encounter (Signed)
Pt advised Dr. Jackalyn Lombard recommendations and she will keep track of her BP the next few days and let us know if no improvement.

## 2018-10-26 ENCOUNTER — Encounter: Payer: Medicare Other | Admitting: Nurse Practitioner

## 2018-11-01 ENCOUNTER — Other Ambulatory Visit: Payer: Self-pay

## 2018-11-01 ENCOUNTER — Ambulatory Visit (INDEPENDENT_AMBULATORY_CARE_PROVIDER_SITE_OTHER): Payer: Medicare Other | Admitting: Student

## 2018-11-01 VITALS — BP 136/68 | HR 63 | Ht 64.0 in | Wt 148.0 lb

## 2018-11-01 DIAGNOSIS — I10 Essential (primary) hypertension: Secondary | ICD-10-CM

## 2018-11-01 DIAGNOSIS — I495 Sick sinus syndrome: Secondary | ICD-10-CM

## 2018-11-01 DIAGNOSIS — I480 Paroxysmal atrial fibrillation: Secondary | ICD-10-CM

## 2018-11-01 LAB — CUP PACEART INCLINIC DEVICE CHECK
Battery Remaining Longevity: 84 mo
Battery Voltage: 3.01 V
Brady Statistic AP VP Percent: 4.26 %
Brady Statistic AP VS Percent: 78.04 %
Brady Statistic AS VP Percent: 5.06 %
Brady Statistic AS VS Percent: 12.64 %
Brady Statistic RA Percent Paced: 79.18 %
Brady Statistic RV Percent Paced: 8.97 %
Date Time Interrogation Session: 20200921131851
Implantable Lead Implant Date: 20180115
Implantable Lead Implant Date: 20180115
Implantable Lead Location: 753859
Implantable Lead Location: 753860
Implantable Lead Model: 5076
Implantable Lead Model: 5076
Implantable Pulse Generator Implant Date: 20180115
Lead Channel Impedance Value: 361 Ohm
Lead Channel Impedance Value: 418 Ohm
Lead Channel Impedance Value: 437 Ohm
Lead Channel Impedance Value: 475 Ohm
Lead Channel Pacing Threshold Amplitude: 0.625 V
Lead Channel Pacing Threshold Amplitude: 0.875 V
Lead Channel Pacing Threshold Pulse Width: 0.4 ms
Lead Channel Pacing Threshold Pulse Width: 0.4 ms
Lead Channel Sensing Intrinsic Amplitude: 0.875 mV
Lead Channel Sensing Intrinsic Amplitude: 0.875 mV
Lead Channel Sensing Intrinsic Amplitude: 13.125 mV
Lead Channel Sensing Intrinsic Amplitude: 15.125 mV
Lead Channel Setting Pacing Amplitude: 2 V
Lead Channel Setting Pacing Amplitude: 2.5 V
Lead Channel Setting Pacing Pulse Width: 0.4 ms
Lead Channel Setting Sensing Sensitivity: 2 mV

## 2018-11-01 NOTE — Patient Instructions (Signed)
Medication Instructions:  Your physician recommends that you continue on your current medications as directed. Please refer to the Current Medication list given to you today.  If you need a refill on your cardiac medications before your next appointment, please call your pharmacy.   Lab work:  BMET AND CBC   If you have labs (blood work) drawn today and your tests are completely normal, you will receive your results only by: [name] Kitchen MyChart Message (if you have MyChart) OR . A paper copy in the mail If you have any lab test that is abnormal or we need to change your treatment, we will call you to review the results.  Testing/Procedures: NONE ORDERED  TODAY  Follow-Up: At [facility], you and your health needs are our priority.  As part of our continuing mission to provide you with exceptional heart care, we have created designated Provider Care Teams.  These Care Teams include your primary Cardiologist (physician) and Advanced Practice Providers (APPs -  Physician Assistants and Nurse Practitioners) who all work together to provide you with the care you need, when you need it. You will need a follow up appointment in 6 months.  Please call our office 2 months in advance to schedule this appointment.  You may see Dr. Rayann Heman  or one of the following Advanced Practice Providers on your designated Care Team:   Chanetta Marshall, NP . Tommye Standard, PA-C . Donavan Burnet PA-C   Any Other Special Instructions Will Be Listed Below (If Applicable).

## 2018-11-01 NOTE — Progress Notes (Signed)
Electrophysiology Office Note Date: 11/01/2018  ID:  Sussie, Diogo 12/30/1930, MRN [id number]  PCP: [name], MD Primary Cardiologist: No primary care provider on file. Electrophysiologist: None  CC: Pacemaker follow-up  Erie Coors is a 85 y.o. female seen today for Dr. Rayann Heman. She presents today for routine electrophysiology followup.  Since last being seen in our clinic, the patient reports doing well. Her diltiazem was decreased several weeks ago due to lightheadedness/dizziness. She has had no further since. She denies chest pain, palpitations, dyspnea, PND, orthopnea, nausea, vomiting, syncope, edema, weight gain, or early satiety.  Device History: Medtronic Dual Chamber PPM implanted 02/25/2016 for SSS  Past Medical History:  Diagnosis Date  . Arthritis    "all over my body" (02/25/2016)  . 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 GERD (gastroesophageal reflux disease)    hx; "when I was working"  . Hypertension   . Macular degeneration   . Paroxysmal atrial fibrillation (HCC)   . Presence of permanent cardiac pacemaker   . 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 BREAST BIOPSY Right   . BREAST LUMPECTOMY Right 1999   RADIATION  . CARDIAC CATHETERIZATION  02/05/1999   HYPERDYNAMIC LEFT VENTRICLE WITH EF OF 75-80%  . CARDIOVERSION N/A 01/22/2015   Procedure: CARDIOVERSION;  Surgeon: [name], MD;  Location: [facility] ENDOSCOPY;  Service: Cardiovascular;  Laterality: N/A;  . CARDIOVERSION N/A 02/22/2015   Procedure: CARDIOVERSION;  Surgeon: [name], MD;  Location: [facility] ENDOSCOPY;  Service: Cardiovascular;  Laterality: N/A;  . DILATION AND CURETTAGE OF UTERUS    . ENUCLEATION Right 2012  . EP IMPLANTABLE DEVICE N/A 02/25/2016   Procedure: Pacemaker Implant;  Surgeon: [name], MD;  Location: [facility] LAB;  Service: Cardiovascular;  Laterality: N/A;   . INSERT / REPLACE / REMOVE PACEMAKER    . TONSILLECTOMY      Current Outpatient Medications  Medication Sig Dispense Refill  . acetaminophen (TYLENOL) 650 MG CR tablet Take 650 mg by mouth daily as needed for pain.    . calcium carbonate (TUMS - DOSED IN MG ELEMENTAL CALCIUM) 500 MG chewable tablet Chew 1-2 tablets by mouth daily as needed for indigestion or heartburn.    . cetirizine (ZYRTEC) 10 MG tablet Take 10 mg by mouth at bedtime.     . Cholecalciferol (VITAMIN D) 1000 UNITS capsule Take 1,000 Units by mouth at bedtime.     . clobetasol cream (TEMOVATE) [id number] % Apply 1 application topically daily as needed (As directed).    [name] Kitchen diltiazem (CARDIZEM CD) 120 MG 24 hr capsule Take 1 capsule (120 mg total) by mouth daily. 60 capsule 8  . fexofenadine (ALLEGRA) 180 MG tablet Take 180 mg by mouth daily as needed for allergies.     . flecainide (TAMBOCOR) 100 MG tablet Take 1 tablet (100 mg total) by mouth 2 (two) times daily. 180 tablet 3  . flecainide (TAMBOCOR) 100 MG tablet TAKE 1 TABLET BY MOUTH TWICE DAILY. 180 tablet 2  . Glucosamine-Chondroitin (GLUCOSAMINE CHONDR COMPLEX PO) Take 1 capsule by mouth daily.    . Hypromellose (ARTIFICIAL TEARS OP) Place 1 drop into the left eye 3 (three) times daily.    . Multiple Vitamins-Minerals (CENTRUM SILVER PO) Take 1 capsule by mouth daily.     . Multiple Vitamins-Minerals (PRESERVISION/LUTEIN PO) Take 1 capsule by mouth 2 (two) times  daily.     . neomycin-polymyxin-dexameth (MAXITROL) 0.1 % OINT Place 1 application into the right eye at bedtime.    [name] Kitchen oxybutynin (DITROPAN) 5 MG tablet Take 5 mg by mouth 3 (three) times daily as needed for bladder spasms.     Vladimir Faster Glycol-Propyl Glycol (SYSTANE OP) Place 1 drop into the right eye 3 (three) times daily.    . polyethylene glycol (MIRALAX / GLYCOLAX) packet Take 17 g by mouth daily as needed for moderate constipation.     . sertraline (ZOLOFT) 100 MG tablet Take 100 mg by mouth at bedtime.     .  sertraline (ZOLOFT) 50 MG tablet Take 50 mg by mouth daily.    [name] Kitchen triamcinolone cream (KENALOG) 0.1 % Apply 1 application topically daily as needed (rash).    Alveda Reasons 20 MG TABS tablet TAKE 1 TABLET ONCE DAILY WITH SUPPER. 90 tablet 1   No current facility-administered medications for this visit.     Allergies:   Shellfish allergy, Atropine, Codeine, Doxycycline, Fenoprofen calcium, Iodinated diagnostic agents, Isopto hyoscine [scopolamine], Levaquin [levofloxacin], Other, Oxycodone, Penicillins, Sulfa drugs cross reactors, Warfarin, Ceclor [cefaclor], and Tape   Social History: Social History   Socioeconomic History  . Marital status: Married    Spouse name: Not on file  . Number of children: 3  . Years of education: Not on file  . Highest education level: Not on file  Occupational History    Employer: RETIRED  Social Needs  . Financial resource strain: Not on file  . Food insecurity    Worry: Not on file    Inability: Not on file  . Transportation needs    Medical: Not on file    Non-medical: Not on file  Tobacco Use  . Smoking status: Never Smoker  . Smokeless tobacco: Never Used  Substance and Sexual Activity  . Alcohol use: Yes    Alcohol/week: 0.0 standard drinks    Comment: 02/25/2016 "glass of wine/month"  . Drug use: No  . Sexual activity: Yes    Birth control/protection: Post-menopausal    Comment: 1st intercourse 59 yo-1 partner  Lifestyle  . Physical activity    Days per week: Not on file    Minutes per session: Not on file  . Stress: Not on file  Relationships  . Social Herbalist on phone: Not on file    Gets together: Not on file    Attends religious service: Not on file    Active member of club or organization: Not on file    Attends meetings of clubs or organizations: Not on file    Relationship status: Not on file  . Intimate partner violence    Fear of current or ex partner: Not on file    Emotionally abused: Not on file     Physically abused: Not on file    Forced sexual activity: Not on file  Other Topics Concern  . Not on file  Social History Narrative   Lives in [place].  Retired Solicitor.    Family History: Family History  Problem Relation Age of Onset  . Hypertension Father   . Heart attack Father   . Parkinsonism Father   . Diabetes Brother   . Heart disease Brother   . Lung cancer Brother   . Stroke Mother      Review of Systems: All other systems reviewed and are otherwise negative except as noted above.  Physical Exam: Vitals:   11/01/18 1228  BP: 136/68  Pulse: 63  Weight: 148 lb (67.1 kg)  Height: 5\' 4"  (1.626 m)     GEN- The patient is elderly appearing, alert and oriented x 3 today.   HEENT: normocephalic, atraumatic; sclera clear, conjunctiva pink; hearing intact; oropharynx clear; neck supple  Lungs- Clear to ausculation bilaterally, normal work of breathing.  No wheezes, rales, rhonchi Heart- Regular rate and rhythm, no murmurs, rubs or gallops  GI- soft, non-tender, non-distended, bowel sounds present  Extremities- no clubbing, cyanosis, or edema  MS- no significant deformity or atrophy Skin- warm and dry, no rash or lesion; PPM pocket well healed Psych- euthymic mood, full affect Neuro- strength and sensation are intact  PPM Interrogation- reviewed in detail today,  See PACEART report  EKG:  EKG is not ordered today. The ekg from 03/24/2018 was reviewed and showed Afib 78 bpm with RBBB @ 158 ms.   Recent Labs: No results found for requested labs within last 8760 hours.   Wt Readings from Last 3 Encounters:  11/01/18 148 lb (67.1 kg)  03/24/18 156 lb 12.8 oz (71.1 kg)  11/18/17 156 lb (70.8 kg)     Other studies Reviewed: Additional studies/ records that were reviewed today include: PPM op note, Echo 12/2014 (LVEF 55-60%), most recent remote device checks (08/30/2018)  Assessment and Plan:  1.  SSS s/p Medtronic PPM  Normal PPM function See Pace  Art report No changes today  2. Persistent afib/atypical atrial flutter Asymptomatic.  Overall burden is 13.8% (45% last visit)  Continue Xarelto.  Continue flecainide. If her burden increases, can consider stopping. She has not been interested in additional AADs or ablation.   3. HTN Continue current regiment with recent lightheadedness   Current medicines are reviewed at length with the patient today.   The patient does not have concerns regarding her medicines.  The following changes were made today:  none  Labs/ tests ordered today include:  Orders Placed This Encounter  Procedures  . Basic metabolic panel  . CBC  . CUP PACEART INCLINIC DEVICE CHECK    Disposition:   Follow up with EP APP in 6 months.   Jacalyn Lefevre, PA-C  11/01/2018 1:19 PM  [place] [address] [address] [phone] (office) [phone] (fax)

## 2018-11-02 LAB — CBC
Hematocrit: 28.1 % — ABNORMAL LOW (ref 34.0–46.6)
Hemoglobin: 9.8 g/dL — ABNORMAL LOW (ref 11.1–15.9)
MCH: 35.5 pg — ABNORMAL HIGH (ref 26.6–33.0)
MCHC: 34.9 g/dL (ref 31.5–35.7)
MCV: 102 fL — ABNORMAL HIGH (ref 79–97)
Platelets: 243 10*3/uL (ref 150–450)
RBC: 2.76 x10E6/uL — ABNORMAL LOW (ref 3.77–5.28)
RDW: 12.7 % (ref 11.7–15.4)
WBC: 5.3 10*3/uL (ref 3.4–10.8)

## 2018-11-02 LAB — BASIC METABOLIC PANEL
BUN/Creatinine Ratio: 18 (ref 12–28)
BUN: 12 mg/dL (ref 8–27)
CO2: 24 mmol/L (ref 20–29)
Calcium: 9.1 mg/dL (ref 8.7–10.3)
Chloride: 105 mmol/L (ref 96–106)
Creatinine, Ser: 0.67 mg/dL (ref 0.57–1.00)
GFR calc Af Amer: 93 mL/min/{1.73_m2} (ref >59)
GFR calc non Af Amer: 80 mL/min/{1.73_m2} (ref >59)
Glucose: 95 mg/dL (ref 65–99)
Potassium: 4.2 mmol/L (ref 3.5–5.2)
Sodium: 141 mmol/L (ref 134–144)

## 2018-11-04 ENCOUNTER — Telehealth: Payer: Self-pay

## 2018-11-04 NOTE — Telephone Encounter (Signed)
Notes recorded by Frederik Schmidt, RN on 11/04/2018 at 12:44 PM EDT  The patient has been notified of the result and verbalized understanding. All questions (if any) were answered.  Frederik Schmidt, RN 11/04/2018 12:44 PM

## 2018-11-18 ENCOUNTER — Encounter: Payer: Self-pay | Admitting: Gynecology

## 2018-11-29 ENCOUNTER — Ambulatory Visit (INDEPENDENT_AMBULATORY_CARE_PROVIDER_SITE_OTHER): Payer: Medicare Other | Admitting: *Deleted

## 2018-11-29 DIAGNOSIS — I480 Paroxysmal atrial fibrillation: Secondary | ICD-10-CM

## 2018-11-29 DIAGNOSIS — I495 Sick sinus syndrome: Secondary | ICD-10-CM | POA: Diagnosis not present

## 2018-11-29 LAB — CUP PACEART REMOTE DEVICE CHECK
Battery Remaining Longevity: 81 mo
Battery Voltage: 3.01 V
Brady Statistic AP VP Percent: 3.39 %
Brady Statistic AP VS Percent: 92.25 %
Brady Statistic AS VP Percent: 3.47 %
Brady Statistic AS VS Percent: 0.89 %
Brady Statistic RA Percent Paced: 94.74 %
Brady Statistic RV Percent Paced: 6.86 %
Date Time Interrogation Session: 20201019190607
Implantable Lead Implant Date: 20180115
Implantable Lead Implant Date: 20180115
Implantable Lead Location: 753859
Implantable Lead Location: 753860
Implantable Lead Model: 5076
Implantable Lead Model: 5076
Implantable Pulse Generator Implant Date: 20180115
Lead Channel Impedance Value: 342 Ohm
Lead Channel Impedance Value: 418 Ohm
Lead Channel Impedance Value: 418 Ohm
Lead Channel Impedance Value: 475 Ohm
Lead Channel Pacing Threshold Amplitude: 0.875 V
Lead Channel Pacing Threshold Amplitude: 1 V
Lead Channel Pacing Threshold Pulse Width: 0.4 ms
Lead Channel Pacing Threshold Pulse Width: 0.4 ms
Lead Channel Sensing Intrinsic Amplitude: 0.75 mV
Lead Channel Sensing Intrinsic Amplitude: 0.75 mV
Lead Channel Sensing Intrinsic Amplitude: 13.25 mV
Lead Channel Sensing Intrinsic Amplitude: 13.25 mV
Lead Channel Setting Pacing Amplitude: 2 V
Lead Channel Setting Pacing Amplitude: 2.5 V
Lead Channel Setting Pacing Pulse Width: 0.4 ms
Lead Channel Setting Sensing Sensitivity: 2 mV

## 2018-12-13 ENCOUNTER — Emergency Department (HOSPITAL_COMMUNITY): Payer: Medicare Other

## 2018-12-13 ENCOUNTER — Encounter (HOSPITAL_COMMUNITY): Payer: Self-pay

## 2018-12-13 ENCOUNTER — Other Ambulatory Visit: Payer: Self-pay

## 2018-12-13 ENCOUNTER — Emergency Department (HOSPITAL_COMMUNITY)
Admission: EM | Admit: 2018-12-13 | Discharge: 2018-12-14 | Disposition: A | Discharge status: Home / Self Care | Payer: Medicare Other | Attending: Emergency Medicine | Admitting: Emergency Medicine

## 2018-12-13 DIAGNOSIS — W010XXA Fall on same level from slipping, tripping and stumbling without subsequent striking against object, initial encounter: Secondary | ICD-10-CM | POA: Diagnosis not present

## 2018-12-13 DIAGNOSIS — Z950 Presence of cardiac pacemaker: Secondary | ICD-10-CM | POA: Insufficient documentation

## 2018-12-13 DIAGNOSIS — D689 Coagulation defect, unspecified: Secondary | ICD-10-CM | POA: Insufficient documentation

## 2018-12-13 DIAGNOSIS — S42291A Other displaced fracture of upper end of right humerus, initial encounter for closed fracture: Secondary | ICD-10-CM | POA: Diagnosis not present

## 2018-12-13 DIAGNOSIS — Z7901 Long term (current) use of anticoagulants: Secondary | ICD-10-CM | POA: Insufficient documentation

## 2018-12-13 DIAGNOSIS — Y998 Other external cause status: Secondary | ICD-10-CM | POA: Insufficient documentation

## 2018-12-13 DIAGNOSIS — Z853 Personal history of malignant neoplasm of breast: Secondary | ICD-10-CM | POA: Insufficient documentation

## 2018-12-13 DIAGNOSIS — S4991XA Unspecified injury of right shoulder and upper arm, initial encounter: Secondary | ICD-10-CM | POA: Diagnosis present

## 2018-12-13 DIAGNOSIS — W19XXXA Unspecified fall, initial encounter: Secondary | ICD-10-CM

## 2018-12-13 DIAGNOSIS — Y92010 Kitchen of single-family (private) house as the place of occurrence of the external cause: Secondary | ICD-10-CM | POA: Diagnosis not present

## 2018-12-13 DIAGNOSIS — I10 Essential (primary) hypertension: Secondary | ICD-10-CM | POA: Insufficient documentation

## 2018-12-13 DIAGNOSIS — Z79899 Other long term (current) drug therapy: Secondary | ICD-10-CM | POA: Insufficient documentation

## 2018-12-13 DIAGNOSIS — Y9389 Activity, other specified: Secondary | ICD-10-CM | POA: Insufficient documentation

## 2018-12-13 MED ORDER — FENTANYL CITRATE (PF) 100 MCG/2ML IJ SOLN
50.0000 ug | Freq: Once | INTRAMUSCULAR | Status: AC
  Administered 2018-12-13: 50 ug via INTRAVENOUS
  Filled 2018-12-13: qty 2

## 2018-12-13 NOTE — ED Triage Notes (Addendum)
Pt BIB EMS from home. Pt fell from a height of 4 feet at 2100. Pt c/o R arm pain and hip pain. Pt denies LOC, denies hitting head. Pt has pacemaker. Pt on blood thinners.  172/82 96% RA

## 2018-12-14 LAB — COMPREHENSIVE METABOLIC PANEL
ALT: 11 U/L (ref 0–44)
AST: 25 U/L (ref 15–41)
Albumin: 4 g/dL (ref 3.5–5.0)
Alkaline Phosphatase: 126 U/L (ref 38–126)
Anion gap: 11 (ref 5–15)
BUN: 24 mg/dL — ABNORMAL HIGH (ref 8–23)
CO2: 21 mmol/L — ABNORMAL LOW (ref 22–32)
Calcium: 9.3 mg/dL (ref 8.9–10.3)
Chloride: 105 mmol/L (ref 98–111)
Creatinine, Ser: 1.12 mg/dL — ABNORMAL HIGH (ref 0.44–1.00)
GFR calc Af Amer: 52 mL/min — ABNORMAL LOW (ref >60)
GFR calc non Af Amer: 44 mL/min — ABNORMAL LOW (ref >60)
Glucose, Bld: 117 mg/dL — ABNORMAL HIGH (ref 70–99)
Potassium: 4.2 mmol/L (ref 3.5–5.1)
Sodium: 137 mmol/L (ref 135–145)
Total Bilirubin: 0.8 mg/dL (ref 0.3–1.2)
Total Protein: 6.5 g/dL (ref 6.5–8.1)

## 2018-12-14 LAB — CBC WITH DIFFERENTIAL/PLATELET
Abs Immature Granulocytes: 0.06 10*3/uL (ref 0.00–0.07)
Basophils Absolute: 0 10*3/uL (ref 0.0–0.1)
Basophils Relative: 0 %
Eosinophils Absolute: 0.1 10*3/uL (ref 0.0–0.5)
Eosinophils Relative: 1 %
HCT: 29.8 % — ABNORMAL LOW (ref 36.0–46.0)
Hemoglobin: 9.6 g/dL — ABNORMAL LOW (ref 12.0–15.0)
Immature Granulocytes: 0 %
Lymphocytes Relative: 11 %
Lymphs Abs: 1.5 10*3/uL (ref 0.7–4.0)
MCH: 36.1 pg — ABNORMAL HIGH (ref 26.0–34.0)
MCHC: 32.2 g/dL (ref 30.0–36.0)
MCV: 112 fL — ABNORMAL HIGH (ref 80.0–100.0)
Monocytes Absolute: 0.8 10*3/uL (ref 0.1–1.0)
Monocytes Relative: 6 %
Neutro Abs: 11.1 10*3/uL — ABNORMAL HIGH (ref 1.7–7.7)
Neutrophils Relative %: 82 %
Platelets: 207 10*3/uL (ref 150–400)
RBC: 2.66 MIL/uL — ABNORMAL LOW (ref 3.87–5.11)
RDW: 14.3 % (ref 11.5–15.5)
WBC: 13.6 10*3/uL — ABNORMAL HIGH (ref 4.0–10.5)
nRBC: 0 % (ref 0.0–0.2)

## 2018-12-14 MED ORDER — HYDROCODONE-ACETAMINOPHEN 5-325 MG PO TABS: 1.0000 | ORAL_TABLET | ORAL | 0 refills | Status: DC | PRN

## 2018-12-14 MED ORDER — FENTANYL CITRATE (PF) 100 MCG/2ML IJ SOLN
50.0000 ug | Freq: Once | INTRAMUSCULAR | Status: AC
  Administered 2018-12-14: 01:00:00 50 ug via INTRAVENOUS
  Filled 2018-12-14: qty 2

## 2018-12-14 NOTE — ED Provider Notes (Signed)
[facility] DEPT Provider Note   CSN: [id number] Arrival date & time: 12/13/18  2233     History   Chief Complaint Chief Complaint  Patient presents with  . Fall  . Right Arm Pain    HPI Megan Hester is a 85 y.o. female.     Patient to ED after fall while at home. She states she was reaching for something on top of the refrigerator and her feet slipped causing her to fall onto her right shoulder. She denies hitting her head. She has been up walking since the fall. No LOC, nausea. She has a history of pacemaker, on Xarelto. No chest pain, abdominal pain, nausea, neck or back pain.  The history is provided by the patient. No language interpreter was used.  Fall Pertinent negatives include no chest pain, no abdominal pain, no headaches and no shortness of breath.    Past Medical History:  Diagnosis Date  . Arthritis    "all over my body" (02/25/2016)  . 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 GERD (gastroesophageal reflux disease)    hx; "when I was working"  . Hypertension   . Macular degeneration   . Paroxysmal atrial fibrillation ([facility])   . Presence of permanent cardiac pacemaker   . 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 Pseudophakia of left eye 04/03/2016  . Sick sinus syndrome ([place]) 02/25/2016  . Cancer ([place])   . Shortness of breath 11/17/2011  . 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 Nuclear cataract 04/10/2011  . Age-related macular degeneration 12/03/2010  . 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 History of enucleation of right eyeball 12/03/2010  . Hypertension 09/12/2010  . A-fib ([place]) 07/16/2010    Past Surgical History:  Procedure Laterality Date  . BREAST BIOPSY Right   . BREAST LUMPECTOMY Right 1999   RADIATION  . CARDIAC CATHETERIZATION  02/05/1999   HYPERDYNAMIC LEFT  VENTRICLE WITH EF OF 75-80%  . CARDIOVERSION N/A 01/22/2015   Procedure: CARDIOVERSION;  Surgeon: [name], MD;  Location: [facility] ENDOSCOPY;  Service: Cardiovascular;  Laterality: N/A;  . CARDIOVERSION N/A 02/22/2015   Procedure: CARDIOVERSION;  Surgeon: [name], MD;  Location: [facility] ENDOSCOPY;  Service: Cardiovascular;  Laterality: N/A;  . DILATION AND CURETTAGE OF UTERUS    . ENUCLEATION Right 2012  . EP IMPLANTABLE DEVICE N/A 02/25/2016   Procedure: Pacemaker Implant;  Surgeon: [name], MD;  Location: [facility] CV LAB;  Service: Cardiovascular;  Laterality: N/A;  . INSERT / REPLACE / REMOVE PACEMAKER    . TONSILLECTOMY       OB History    Gravida  3   Para  3   Term  3   Preterm      AB      Living  3     SAB      TAB      Ectopic      Multiple      Live Births               Home Medications    Prior to Admission medications   Medication Sig Start Date End Date Taking? Authorizing Provider  acetaminophen (TYLENOL) 650 MG CR tablet Take 650 mg by mouth daily as needed for pain.    [provider]  calcium carbonate (TUMS -  DOSED IN MG ELEMENTAL CALCIUM) 500 MG chewable tablet Chew 1-2 tablets by mouth daily as needed for indigestion or heartburn.    [provider]  cetirizine (ZYRTEC) 10 MG tablet Take 10 mg by mouth at bedtime.     [provider]  Cholecalciferol (VITAMIN D) 1000 UNITS capsule Take 1,000 Units by mouth at bedtime.     [provider]  clobetasol cream (TEMOVATE) [id number] % Apply 1 application topically daily as needed (As directed).    [provider]  diltiazem (CARDIZEM CD) 120 MG 24 hr capsule Take 1 capsule (120 mg total) by mouth daily. 10/12/18   Allred, [name], MD  fexofenadine (ALLEGRA) 180 MG tablet Take 180 mg by mouth daily as needed for allergies.     [provider]  flecainide (TAMBOCOR) 100 MG tablet Take 1 tablet (100 mg total) by mouth 2 (two) times daily.  06/02/17   Allred, [name], MD  flecainide (TAMBOCOR) 100 MG tablet TAKE 1 TABLET BY MOUTH TWICE DAILY. 06/30/18   Allred, [name], MD  Glucosamine-Chondroitin (GLUCOSAMINE CHONDR COMPLEX PO) Take 1 capsule by mouth daily.    [provider]  Hypromellose (ARTIFICIAL TEARS OP) Place 1 drop into the left eye 3 (three) times daily.    [provider]  Multiple Vitamins-Minerals (CENTRUM SILVER PO) Take 1 capsule by mouth daily.     [provider]  Multiple Vitamins-Minerals (PRESERVISION/LUTEIN PO) Take 1 capsule by mouth 2 (two) times daily.     [provider]  neomycin-polymyxin-dexameth (MAXITROL) 0.1 % OINT Place 1 application into the right eye at bedtime.    [provider]  oxybutynin (DITROPAN) 5 MG tablet Take 5 mg by mouth 3 (three) times daily as needed for bladder spasms.     [provider]  Polyethyl Glycol-Propyl Glycol (SYSTANE OP) Place 1 drop into the right eye 3 (three) times daily.    [provider]  polyethylene glycol (MIRALAX / GLYCOLAX) packet Take 17 g by mouth daily as needed for moderate constipation.     [provider]  sertraline (ZOLOFT) 100 MG tablet Take 100 mg by mouth at bedtime.  08/20/17   [provider]  sertraline (ZOLOFT) 50 MG tablet Take 50 mg by mouth daily.    [provider]  triamcinolone cream (KENALOG) 0.1 % Apply 1 application topically daily as needed (rash).    [provider]  XARELTO 20 MG TABS tablet TAKE 1 TABLET ONCE DAILY WITH SUPPER. 09/27/18   [name], MD    Family History Family History  Problem Relation Age of Onset  . Hypertension Father   . Heart attack Father   . Parkinsonism Father   . Diabetes Brother   . Heart disease Brother   . Lung cancer Brother   . Stroke Mother     Social History Social History   Tobacco Use  . Smoking status: Never Smoker  . Smokeless tobacco: Never Used  Substance Use Topics  . Alcohol use: Yes     Alcohol/week: 0.0 standard drinks    Comment: 02/25/2016 "glass of wine/month"  . Drug use: No     Allergies   Shellfish allergy, Atropine, Codeine, Doxycycline, Fenoprofen calcium, Iodinated diagnostic agents, Isopto hyoscine [scopolamine], Levaquin [levofloxacin], Other, Oxycodone, Penicillins, Sulfa drugs cross reactors, Warfarin, Ceclor [cefaclor], and Tape   Review of Systems Review of Systems  Constitutional: Negative for diaphoresis and fever.  Respiratory: Negative.  Negative for shortness of breath.   Cardiovascular: Negative.  Negative for chest pain.  Gastrointestinal: Negative.  Negative for abdominal pain and nausea.  Musculoskeletal:       See HPI.  Skin: Negative.  Negative for color change and wound.  Neurological: Negative.  Negative for syncope, weakness, numbness and headaches.     Physical Exam Updated Vital Signs BP (!) 159/79   Pulse 60   Temp 97.9 F (36.6 C) (Oral)   Resp 18   SpO2 99%   Physical Exam Vitals signs and nursing note reviewed.  Constitutional:      General: She is not in acute distress.    Appearance: Normal appearance. She is not diaphoretic.  HENT:     Head: Normocephalic and atraumatic.  Eyes:     Conjunctiva/sclera: Conjunctivae normal.  Neck:     Musculoskeletal: Normal range of motion and neck supple.  Cardiovascular:     Rate and Rhythm: Normal rate and regular rhythm.     Pulses: Normal pulses.     Heart sounds: No murmur.  Pulmonary:     Effort: Pulmonary effort is normal.     Breath sounds: No wheezing, rhonchi or rales.  Abdominal:     General: Abdomen is flat.     Tenderness: There is no abdominal tenderness. There is no guarding.  Musculoskeletal:     Comments: Right shoulder is swollen proximally. No discoloration. No tenderness of the hand, wrist, forearm or elbow. No midline cervical, thoracic or lumbar tenderness. No pelvic or hip tenderness. FROM LE's with full strength bilaterally.   Skin:    General:  Skin is warm and dry.     Findings: No bruising or erythema.  Neurological:     General: No focal deficit present.     Mental Status: She is alert and oriented to person, place, and time.      ED Treatments / Results  Labs (all labs ordered are listed, but only abnormal results are displayed) Labs Reviewed  CBC WITH DIFFERENTIAL/PLATELET - Abnormal; Notable for the following components:      Result Value   WBC 13.6 (*)    RBC 2.66 (*)    Hemoglobin 9.6 (*)    HCT 29.8 (*)    MCV 112.0 (*)    MCH 36.1 (*)    All other components within normal limits  COMPREHENSIVE METABOLIC PANEL    EKG None  Radiology Dg Shoulder Right  Result Date: 12/14/2018 CLINICAL DATA:  Pain EXAM: RIGHT SHOULDER - 2+ VIEW COMPARISON:  None. FINDINGS: There is an acute comminuted fracture involving the proximal right humerus. There is a large fracture fragment measuring approximately 2 point 3 cm likely associated with the greater tuberosity. No dislocation. IMPRESSION: Comminuted fracture of the proximal right humerus as above. No dislocation. Electronically Signed   By: Constance Holster M.D.   On: 12/14/2018 00:17    Procedures Procedures (including critical care time)  Medications Ordered in ED Medications  fentaNYL (SUBLIMAZE) injection 50 mcg (50 mcg Intravenous Given 12/13/18 2342)     Initial Impression / Assessment and Plan / ED Course  I have reviewed the triage vital signs and the nursing notes.  Pertinent labs & imaging results that were available during my care of the patient were reviewed by me and considered in my medical decision making (see chart for details).        Patient to ED after mechanical fall, landing on right shoulder. Did not his her head, has no c/o neck, chest or abdominal pain. Has been ambulatory without  c/o hip/pelvic pain. She is anticoagulated on Xarelto.  The patient is alert and well oriented. She is considered a reliable historian. By exam, her injury  is isolated to the right shoulder. Do not suspect head, neck, chest, abdominal or pelvic/hip injury.   There is a fracture of humeral head on imaging. Re-examination finds not induration, ecchymosis, or paresthesia of the right UE. Distal pulses remain prominent.   She can be discharged home with a shoulder immobilizer. She is an established patient with Dr. Mayer Camel and will call him tomorrow for an appointment to continue treatment. An Epic email has also been sent to Dr. Mayer Camel.   Pain has been managed with Fentanyl. Will discharge home on [place]. She has family living in the home with her to assist with care. She is comfortable with plan of discharge. Strict return precautions discussed.   Final Clinical Impressions(s) / ED Diagnoses   Final diagnoses:  None   1. Mechanical fall 2. Right humerus fracture 3. Coagulopathy  ED Discharge Orders    None       Charlann Lange, Hershal Coria 12/14/18 0119    Milton Ferguson, MD 12/14/18 1520

## 2018-12-14 NOTE — Discharge Instructions (Addendum)
Please call Dr. Damita Dunnings office to schedule an appointment for further management of fractured shoulder. Take Norco for pain as directed.  Return to the emergency department with any severe swelling of the right shoulder or arm, numbness or tingling of the right arm, or for new concern.

## 2018-12-14 NOTE — ED Notes (Signed)
Pt ambulatory in hallway to restroom without assistance. Pt denies dizziness, or unbalanced.

## 2018-12-20 NOTE — Progress Notes (Signed)
Remote pacemaker transmission.   

## 2019-02-01 ENCOUNTER — Encounter (HOSPITAL_COMMUNITY)
Admission: EM | Disposition: A | Discharge status: Home Health | Payer: Self-pay | Source: Home / Self Care | Attending: Internal Medicine

## 2019-02-01 ENCOUNTER — Inpatient Hospital Stay (HOSPITAL_COMMUNITY): Payer: Medicare Other

## 2019-02-01 ENCOUNTER — Inpatient Hospital Stay (HOSPITAL_COMMUNITY)
Admission: EM | Admit: 2019-02-01 | Discharge: 2019-02-16 | DRG: 853 | Disposition: A | Discharge status: Home Health | Payer: Medicare Other | Attending: Internal Medicine | Admitting: Internal Medicine

## 2019-02-01 ENCOUNTER — Encounter (HOSPITAL_COMMUNITY): Payer: Self-pay | Admitting: *Deleted

## 2019-02-01 ENCOUNTER — Emergency Department (HOSPITAL_COMMUNITY): Payer: Medicare Other

## 2019-02-01 ENCOUNTER — Inpatient Hospital Stay (HOSPITAL_COMMUNITY): Payer: Medicare Other | Admitting: Certified Registered Nurse Anesthetist

## 2019-02-01 ENCOUNTER — Other Ambulatory Visit: Payer: Self-pay

## 2019-02-01 DIAGNOSIS — E876 Hypokalemia: Secondary | ICD-10-CM | POA: Diagnosis not present

## 2019-02-01 DIAGNOSIS — K9189 Other postprocedural complications and disorders of digestive system: Secondary | ICD-10-CM | POA: Diagnosis not present

## 2019-02-01 DIAGNOSIS — Z86010 Personal history of colonic polyps: Secondary | ICD-10-CM | POA: Diagnosis not present

## 2019-02-01 DIAGNOSIS — D539 Nutritional anemia, unspecified: Secondary | ICD-10-CM | POA: Diagnosis present

## 2019-02-01 DIAGNOSIS — Z801 Family history of malignant neoplasm of trachea, bronchus and lung: Secondary | ICD-10-CM

## 2019-02-01 DIAGNOSIS — E440 Moderate protein-calorie malnutrition: Secondary | ICD-10-CM | POA: Diagnosis present

## 2019-02-01 DIAGNOSIS — D72829 Elevated white blood cell count, unspecified: Secondary | ICD-10-CM | POA: Diagnosis not present

## 2019-02-01 DIAGNOSIS — E119 Type 2 diabetes mellitus without complications: Secondary | ICD-10-CM

## 2019-02-01 DIAGNOSIS — A419 Sepsis, unspecified organism: Secondary | ICD-10-CM | POA: Diagnosis present

## 2019-02-01 DIAGNOSIS — I4891 Unspecified atrial fibrillation: Secondary | ICD-10-CM | POA: Diagnosis present

## 2019-02-01 DIAGNOSIS — Q438 Other specified congenital malformations of intestine: Secondary | ICD-10-CM

## 2019-02-01 DIAGNOSIS — I480 Paroxysmal atrial fibrillation: Secondary | ICD-10-CM | POA: Diagnosis present

## 2019-02-01 DIAGNOSIS — I495 Sick sinus syndrome: Secondary | ICD-10-CM | POA: Diagnosis present

## 2019-02-01 DIAGNOSIS — K420 Umbilical hernia with obstruction, without gangrene: Secondary | ICD-10-CM | POA: Diagnosis present

## 2019-02-01 DIAGNOSIS — K55011 Focal (segmental) acute (reversible) ischemia of small intestine: Secondary | ICD-10-CM | POA: Diagnosis present

## 2019-02-01 DIAGNOSIS — R6510 Systemic inflammatory response syndrome (SIRS) of non-infectious origin without acute organ dysfunction: Secondary | ICD-10-CM | POA: Diagnosis not present

## 2019-02-01 DIAGNOSIS — I959 Hypotension, unspecified: Secondary | ICD-10-CM | POA: Diagnosis present

## 2019-02-01 DIAGNOSIS — I4892 Unspecified atrial flutter: Secondary | ICD-10-CM | POA: Diagnosis not present

## 2019-02-01 DIAGNOSIS — I482 Chronic atrial fibrillation, unspecified: Secondary | ICD-10-CM | POA: Diagnosis present

## 2019-02-01 DIAGNOSIS — Z79899 Other long term (current) drug therapy: Secondary | ICD-10-CM

## 2019-02-01 DIAGNOSIS — I10 Essential (primary) hypertension: Secondary | ICD-10-CM | POA: Diagnosis present

## 2019-02-01 DIAGNOSIS — K429 Umbilical hernia without obstruction or gangrene: Secondary | ICD-10-CM | POA: Diagnosis present

## 2019-02-01 DIAGNOSIS — K631 Perforation of intestine (nontraumatic): Secondary | ICD-10-CM | POA: Diagnosis present

## 2019-02-01 DIAGNOSIS — K565 Intestinal adhesions [bands], unspecified as to partial versus complete obstruction: Secondary | ICD-10-CM | POA: Diagnosis not present

## 2019-02-01 DIAGNOSIS — H3530 Unspecified macular degeneration: Secondary | ICD-10-CM | POA: Diagnosis present

## 2019-02-01 DIAGNOSIS — Z8249 Family history of ischemic heart disease and other diseases of the circulatory system: Secondary | ICD-10-CM

## 2019-02-01 DIAGNOSIS — K56609 Unspecified intestinal obstruction, unspecified as to partial versus complete obstruction: Secondary | ICD-10-CM | POA: Diagnosis present

## 2019-02-01 DIAGNOSIS — Z452 Encounter for adjustment and management of vascular access device: Secondary | ICD-10-CM

## 2019-02-01 DIAGNOSIS — I4821 Permanent atrial fibrillation: Secondary | ICD-10-CM | POA: Diagnosis not present

## 2019-02-01 DIAGNOSIS — N179 Acute kidney failure, unspecified: Secondary | ICD-10-CM | POA: Diagnosis present

## 2019-02-01 DIAGNOSIS — K659 Peritonitis, unspecified: Secondary | ICD-10-CM | POA: Diagnosis present

## 2019-02-01 DIAGNOSIS — E872 Acidosis: Secondary | ICD-10-CM | POA: Diagnosis not present

## 2019-02-01 DIAGNOSIS — Z950 Presence of cardiac pacemaker: Secondary | ICD-10-CM

## 2019-02-01 DIAGNOSIS — Z7901 Long term (current) use of anticoagulants: Secondary | ICD-10-CM

## 2019-02-01 DIAGNOSIS — Z833 Family history of diabetes mellitus: Secondary | ICD-10-CM

## 2019-02-01 DIAGNOSIS — K55029 Acute infarction of small intestine, extent unspecified: Secondary | ICD-10-CM | POA: Diagnosis present

## 2019-02-01 DIAGNOSIS — H3560 Retinal hemorrhage, unspecified eye: Secondary | ICD-10-CM | POA: Diagnosis present

## 2019-02-01 DIAGNOSIS — E860 Dehydration: Secondary | ICD-10-CM | POA: Diagnosis present

## 2019-02-01 DIAGNOSIS — K5651 Intestinal adhesions [bands], with partial obstruction: Secondary | ICD-10-CM | POA: Diagnosis present

## 2019-02-01 DIAGNOSIS — Z20822 Contact with and (suspected) exposure to covid-19: Secondary | ICD-10-CM | POA: Diagnosis not present

## 2019-02-01 DIAGNOSIS — F05 Delirium due to known physiological condition: Secondary | ICD-10-CM | POA: Diagnosis not present

## 2019-02-01 DIAGNOSIS — E1169 Type 2 diabetes mellitus with other specified complication: Secondary | ICD-10-CM | POA: Diagnosis not present

## 2019-02-01 DIAGNOSIS — I361 Nonrheumatic tricuspid (valve) insufficiency: Secondary | ICD-10-CM | POA: Diagnosis not present

## 2019-02-01 DIAGNOSIS — R1110 Vomiting, unspecified: Secondary | ICD-10-CM

## 2019-02-01 DIAGNOSIS — D62 Acute posthemorrhagic anemia: Secondary | ICD-10-CM

## 2019-02-01 DIAGNOSIS — Z820 Family history of epilepsy and other diseases of the nervous system: Secondary | ICD-10-CM

## 2019-02-01 DIAGNOSIS — Z781 Physical restraint status: Secondary | ICD-10-CM

## 2019-02-01 DIAGNOSIS — Z860101 Personal history of adenomatous and serrated colon polyps: Secondary | ICD-10-CM

## 2019-02-01 DIAGNOSIS — N816 Rectocele: Secondary | ICD-10-CM

## 2019-02-01 DIAGNOSIS — Z823 Family history of stroke: Secondary | ICD-10-CM

## 2019-02-01 DIAGNOSIS — R6520 Severe sepsis without septic shock: Secondary | ICD-10-CM | POA: Diagnosis not present

## 2019-02-01 HISTORY — PX: LAPAROSCOPIC SMALL BOWEL RESECTION: SHX5929

## 2019-02-01 HISTORY — PX: LAPAROSCOPY: SHX197

## 2019-02-01 HISTORY — PX: APPLICATION OF WOUND VAC: SHX5189

## 2019-02-01 HISTORY — PX: LYSIS OF ADHESION: SHX5961

## 2019-02-01 LAB — COMPREHENSIVE METABOLIC PANEL
ALT: 16 U/L (ref 0–44)
AST: 24 U/L (ref 15–41)
Albumin: 4 g/dL (ref 3.5–5.0)
Alkaline Phosphatase: 146 U/L — ABNORMAL HIGH (ref 38–126)
Anion gap: 17 — ABNORMAL HIGH (ref 5–15)
BUN: 27 mg/dL — ABNORMAL HIGH (ref 8–23)
CO2: 22 mmol/L (ref 22–32)
Calcium: 10.1 mg/dL (ref 8.9–10.3)
Chloride: 99 mmol/L (ref 98–111)
Creatinine, Ser: 1.4 mg/dL — ABNORMAL HIGH (ref 0.44–1.00)
GFR calc Af Amer: 39 mL/min — ABNORMAL LOW (ref >60)
GFR calc non Af Amer: 34 mL/min — ABNORMAL LOW (ref >60)
Glucose, Bld: 211 mg/dL — ABNORMAL HIGH (ref 70–99)
Potassium: 4.4 mmol/L (ref 3.5–5.1)
Sodium: 138 mmol/L (ref 135–145)
Total Bilirubin: 1.7 mg/dL — ABNORMAL HIGH (ref 0.3–1.2)
Total Protein: 7.1 g/dL (ref 6.5–8.1)

## 2019-02-01 LAB — CBC
HCT: 42.7 % (ref 36.0–46.0)
Hemoglobin: 14 g/dL (ref 12.0–15.0)
MCH: 36.6 pg — ABNORMAL HIGH (ref 26.0–34.0)
MCHC: 32.8 g/dL (ref 30.0–36.0)
MCV: 111.5 fL — ABNORMAL HIGH (ref 80.0–100.0)
Platelets: 358 10*3/uL (ref 150–400)
RBC: 3.83 MIL/uL — ABNORMAL LOW (ref 3.87–5.11)
RDW: 14.9 % (ref 11.5–15.5)
WBC: 24.6 10*3/uL — ABNORMAL HIGH (ref 4.0–10.5)
nRBC: 0.5 % — ABNORMAL HIGH (ref 0.0–0.2)

## 2019-02-01 LAB — PROTIME-INR
INR: 1.5 — ABNORMAL HIGH (ref 0.8–1.2)
Prothrombin Time: 18.1 seconds — ABNORMAL HIGH (ref 11.4–15.2)

## 2019-02-01 LAB — RESPIRATORY PANEL BY RT PCR (FLU A&B, COVID)
Influenza A by PCR: NEGATIVE
Influenza B by PCR: NEGATIVE
SARS Coronavirus 2 by RT PCR: NEGATIVE

## 2019-02-01 LAB — LACTIC ACID, PLASMA: Lactic Acid, Venous: 2.9 mmol/L (ref 0.5–1.9)

## 2019-02-01 LAB — LIPASE, BLOOD: Lipase: 26 U/L (ref 11–51)

## 2019-02-01 SURGERY — LAPAROSCOPY, DIAGNOSTIC
Anesthesia: General | Site: Abdomen

## 2019-02-01 MED ORDER — LABETALOL HCL 5 MG/ML IV SOLN
5.0000 mg | INTRAVENOUS | Status: DC | PRN
  Administered 2019-02-06 – 2019-02-07 (×5): 5 mg via INTRAVENOUS
  Filled 2019-02-01 (×3): qty 4

## 2019-02-01 MED ORDER — ROCURONIUM BROMIDE 10 MG/ML (PF) SYRINGE
PREFILLED_SYRINGE | INTRAVENOUS | Status: AC
  Filled 2019-02-01: qty 10

## 2019-02-01 MED ORDER — FENTANYL CITRATE (PF) 100 MCG/2ML IJ SOLN
INTRAMUSCULAR | Status: DC | PRN
  Administered 2019-02-01: 50 ug via INTRAVENOUS
  Administered 2019-02-01: 25 ug via INTRAVENOUS
  Administered 2019-02-01 (×3): 50 ug via INTRAVENOUS
  Administered 2019-02-01: 25 ug via INTRAVENOUS

## 2019-02-01 MED ORDER — MORPHINE SULFATE (PF) 2 MG/ML IV SOLN
1.0000 mg | INTRAVENOUS | Status: DC | PRN
  Administered 2019-02-03 – 2019-02-10 (×6): 1 mg via INTRAVENOUS
  Filled 2019-02-01 (×6): qty 1

## 2019-02-01 MED ORDER — BUPIVACAINE HCL 0.25 % IJ SOLN
INTRAMUSCULAR | Status: AC
  Filled 2019-02-01: qty 1

## 2019-02-01 MED ORDER — PROPOFOL 10 MG/ML IV BOLUS
INTRAVENOUS | Status: AC
  Filled 2019-02-01: qty 20

## 2019-02-01 MED ORDER — PHENYLEPHRINE HCL-NACL 10-0.9 MG/250ML-% IV SOLN
INTRAVENOUS | Status: DC | PRN
  Administered 2019-02-01: 40 ug/min via INTRAVENOUS

## 2019-02-01 MED ORDER — LIDOCAINE 2% (20 MG/ML) 5 ML SYRINGE
INTRAMUSCULAR | Status: AC
  Filled 2019-02-01: qty 5

## 2019-02-01 MED ORDER — SIMETHICONE 40 MG/0.6ML PO SUSP
40.0000 mg | Freq: Four times a day (QID) | ORAL | Status: DC | PRN
  Administered 2019-02-15: 40 mg via ORAL
  Filled 2019-02-01 (×3): qty 0.6

## 2019-02-01 MED ORDER — ACETAMINOPHEN 650 MG RE SUPP: 650.0000 mg | Freq: Four times a day (QID) | RECTAL | Status: DC | PRN

## 2019-02-01 MED ORDER — SODIUM CHLORIDE 0.9 % IV SOLN: Freq: Three times a day (TID) | INTRAVENOUS | Status: AC | PRN

## 2019-02-01 MED ORDER — ONDANSETRON HCL 4 MG/2ML IJ SOLN
INTRAMUSCULAR | Status: DC | PRN
  Administered 2019-02-01: 4 mg via INTRAVENOUS

## 2019-02-01 MED ORDER — FENTANYL CITRATE (PF) 100 MCG/2ML IJ SOLN
INTRAMUSCULAR | Status: AC
  Filled 2019-02-01: qty 2

## 2019-02-01 MED ORDER — EPHEDRINE SULFATE-NACL 50-0.9 MG/10ML-% IV SOSY
PREFILLED_SYRINGE | INTRAVENOUS | Status: DC | PRN
  Administered 2019-02-01 (×2): 10 mg via INTRAVENOUS

## 2019-02-01 MED ORDER — GENTAMICIN SULFATE 40 MG/ML IJ SOLN
5.0000 mg/kg | INTRAVENOUS | Status: AC
  Administered 2019-02-01: 310 mg via INTRAVENOUS
  Filled 2019-02-01: qty 7.75

## 2019-02-01 MED ORDER — CLINDAMYCIN PHOSPHATE 900 MG/50ML IV SOLN
900.0000 mg | INTRAVENOUS | Status: AC
  Administered 2019-02-01: 900 mg via INTRAVENOUS
  Filled 2019-02-01: qty 50

## 2019-02-01 MED ORDER — METOPROLOL TARTRATE 5 MG/5ML IV SOLN
5.0000 mg | Freq: Four times a day (QID) | INTRAVENOUS | Status: DC
  Administered 2019-02-01 – 2019-02-06 (×13): 5 mg via INTRAVENOUS
  Filled 2019-02-01 (×14): qty 5

## 2019-02-01 MED ORDER — DEXAMETHASONE SODIUM PHOSPHATE 10 MG/ML IJ SOLN
INTRAMUSCULAR | Status: AC
  Filled 2019-02-01: qty 1

## 2019-02-01 MED ORDER — HYDROMORPHONE HCL 1 MG/ML IJ SOLN: 0.2500 mg | INTRAMUSCULAR | Status: DC | PRN

## 2019-02-01 MED ORDER — MAGIC MOUTHWASH
15.0000 mL | Freq: Four times a day (QID) | ORAL | Status: DC | PRN
  Administered 2019-02-08 – 2019-02-09 (×2): 15 mL via ORAL
  Filled 2019-02-01 (×2): qty 15

## 2019-02-01 MED ORDER — PHENYLEPHRINE HCL (PRESSORS) 10 MG/ML IV SOLN
INTRAVENOUS | Status: AC
  Filled 2019-02-01: qty 1

## 2019-02-01 MED ORDER — VASOPRESSIN 20 UNIT/ML IV SOLN
INTRAVENOUS | Status: AC
  Filled 2019-02-01: qty 1

## 2019-02-01 MED ORDER — PROCHLORPERAZINE EDISYLATE 10 MG/2ML IJ SOLN: 5.0000 mg | INTRAMUSCULAR | Status: DC | PRN

## 2019-02-01 MED ORDER — ACETAMINOPHEN 325 MG PO TABS: 650.0000 mg | ORAL_TABLET | Freq: Four times a day (QID) | ORAL | Status: DC | PRN

## 2019-02-01 MED ORDER — SODIUM CHLORIDE 0.9 % IV SOLN
1.0000 g | Freq: Two times a day (BID) | INTRAVENOUS | Status: AC
  Administered 2019-02-02 – 2019-02-04 (×5): 1 g via INTRAVENOUS
  Filled 2019-02-01 (×5): qty 1

## 2019-02-01 MED ORDER — EPHEDRINE 5 MG/ML INJ
INTRAVENOUS | Status: AC
  Filled 2019-02-01: qty 10

## 2019-02-01 MED ORDER — 0.9 % SODIUM CHLORIDE (POUR BTL) OPTIME
TOPICAL | Status: DC | PRN
  Administered 2019-02-01: 8000 mL

## 2019-02-01 MED ORDER — SODIUM CHLORIDE 0.9 % IV SOLN: INTRAVENOUS | Status: DC

## 2019-02-01 MED ORDER — CLOBETASOL PROPIONATE 0.05 % EX CREA: 1.0000 "application " | TOPICAL_CREAM | Freq: Every day | CUTANEOUS | Status: DC | PRN

## 2019-02-01 MED ORDER — ORAL CARE MOUTH RINSE
15.0000 mL | Freq: Two times a day (BID) | OROMUCOSAL | Status: DC
  Administered 2019-02-02 – 2019-02-16 (×29): 15 mL via OROMUCOSAL

## 2019-02-01 MED ORDER — BUPIVACAINE LIPOSOME 1.3 % IJ SUSP
20.0000 mL | Freq: Once | INTRAMUSCULAR | Status: DC
  Filled 2019-02-01: qty 20

## 2019-02-01 MED ORDER — DIPHENHYDRAMINE HCL 50 MG/ML IJ SOLN
12.5000 mg | Freq: Four times a day (QID) | INTRAMUSCULAR | Status: DC | PRN
  Administered 2019-02-03 – 2019-02-08 (×3): 12.5 mg via INTRAVENOUS
  Administered 2019-02-08 – 2019-02-13 (×2): 25 mg via INTRAVENOUS
  Filled 2019-02-01 (×5): qty 1

## 2019-02-01 MED ORDER — HYDROMORPHONE HCL 1 MG/ML IJ SOLN
0.5000 mg | INTRAMUSCULAR | Status: DC | PRN
  Administered 2019-02-02 (×2): 1 mg via INTRAVENOUS
  Filled 2019-02-01 (×2): qty 1

## 2019-02-01 MED ORDER — BUPIVACAINE LIPOSOME 1.3 % IJ SUSP
INTRAMUSCULAR | Status: DC | PRN
  Administered 2019-02-01: 20 mL

## 2019-02-01 MED ORDER — SODIUM CHLORIDE 0.9 % IV BOLUS
1000.0000 mL | Freq: Once | INTRAVENOUS | Status: AC
  Administered 2019-02-01: 1000 mL via INTRAVENOUS

## 2019-02-01 MED ORDER — LACTATED RINGERS IV SOLN: INTRAVENOUS | Status: DC

## 2019-02-01 MED ORDER — SODIUM CHLORIDE 0.9 % IV SOLN: INTRAVENOUS | Status: DC | PRN

## 2019-02-01 MED ORDER — SODIUM CHLORIDE 0.9 % IV SOLN
1.0000 g | INTRAVENOUS | Status: AC
  Administered 2019-02-01: 1 g via INTRAVENOUS
  Filled 2019-02-01: qty 1

## 2019-02-01 MED ORDER — ALUM & MAG HYDROXIDE-SIMETH 200-200-20 MG/5ML PO SUSP
30.0000 mL | Freq: Four times a day (QID) | ORAL | Status: DC | PRN
  Administered 2019-02-07: 30 mL via ORAL
  Filled 2019-02-01: qty 30

## 2019-02-01 MED ORDER — PHENYLEPHRINE 40 MCG/ML (10ML) SYRINGE FOR IV PUSH (FOR BLOOD PRESSURE SUPPORT)
PREFILLED_SYRINGE | INTRAVENOUS | Status: AC
  Filled 2019-02-01: qty 10

## 2019-02-01 MED ORDER — ONDANSETRON HCL 4 MG/2ML IJ SOLN
4.0000 mg | Freq: Four times a day (QID) | INTRAMUSCULAR | Status: DC | PRN
  Administered 2019-02-06 – 2019-02-08 (×4): 4 mg via INTRAVENOUS
  Filled 2019-02-01 (×4): qty 2

## 2019-02-01 MED ORDER — ROCURONIUM BROMIDE 10 MG/ML (PF) SYRINGE
PREFILLED_SYRINGE | INTRAVENOUS | Status: DC | PRN
  Administered 2019-02-01: 10 mg via INTRAVENOUS
  Administered 2019-02-01: 60 mg via INTRAVENOUS

## 2019-02-01 MED ORDER — SUGAMMADEX SODIUM 200 MG/2ML IV SOLN
INTRAVENOUS | Status: DC | PRN
  Administered 2019-02-01: 150 mg via INTRAVENOUS
  Administered 2019-02-01: 50 mg via INTRAVENOUS

## 2019-02-01 MED ORDER — SUCCINYLCHOLINE CHLORIDE 200 MG/10ML IV SOSY
PREFILLED_SYRINGE | INTRAVENOUS | Status: AC
  Filled 2019-02-01: qty 10

## 2019-02-01 MED ORDER — ONDANSETRON HCL 4 MG/2ML IJ SOLN
INTRAMUSCULAR | Status: AC
  Filled 2019-02-01: qty 2

## 2019-02-01 MED ORDER — NEOMYCIN-POLYMYXIN-DEXAMETH 3.5-10000-0.1 OP OINT
1.0000 "application " | TOPICAL_OINTMENT | Freq: Every day | OPHTHALMIC | Status: DC
  Administered 2019-02-01 – 2019-02-15 (×13): 1 via OPHTHALMIC
  Filled 2019-02-01: qty 3.5

## 2019-02-01 MED ORDER — ENOXAPARIN SODIUM 40 MG/0.4ML ~~LOC~~ SOLN: 40.0000 mg | Freq: Every day | SUBCUTANEOUS | Status: DC

## 2019-02-01 MED ORDER — LACTATED RINGERS IR SOLN
Status: DC | PRN
  Administered 2019-02-01: 1000 mL

## 2019-02-01 MED ORDER — ONDANSETRON HCL 4 MG/2ML IJ SOLN
4.0000 mg | Freq: Once | INTRAMUSCULAR | Status: AC
  Administered 2019-02-01: 17:00:00 4 mg via INTRAVENOUS
  Filled 2019-02-01: qty 2

## 2019-02-01 MED ORDER — CHLORHEXIDINE GLUCONATE CLOTH 2 % EX PADS
6.0000 | MEDICATED_PAD | Freq: Once | CUTANEOUS | Status: AC
  Administered 2019-02-01: 6 via TOPICAL

## 2019-02-01 MED ORDER — PROMETHAZINE HCL 25 MG/ML IJ SOLN: 6.2500 mg | INTRAMUSCULAR | Status: DC | PRN

## 2019-02-01 MED ORDER — POLYVINYL ALCOHOL 1.4 % OP SOLN
Freq: Two times a day (BID) | OPHTHALMIC | Status: DC
  Administered 2019-02-06 – 2019-02-16 (×5): 1 [drp] via OPHTHALMIC
  Filled 2019-02-01: qty 15

## 2019-02-01 MED ORDER — SUCCINYLCHOLINE CHLORIDE 200 MG/10ML IV SOSY
PREFILLED_SYRINGE | INTRAVENOUS | Status: DC | PRN
  Administered 2019-02-01: 100 mg via INTRAVENOUS

## 2019-02-01 MED ORDER — BISACODYL 10 MG RE SUPP
10.0000 mg | Freq: Every day | RECTAL | Status: DC
  Administered 2019-02-02 – 2019-02-12 (×9): 10 mg via RECTAL
  Filled 2019-02-01 (×10): qty 1

## 2019-02-01 MED ORDER — NAPHAZOLINE-GLYCERIN 0.012-0.2 % OP SOLN: 1.0000 [drp] | Freq: Four times a day (QID) | OPHTHALMIC | Status: DC | PRN

## 2019-02-01 MED ORDER — LIDOCAINE 2% (20 MG/ML) 5 ML SYRINGE
INTRAMUSCULAR | Status: DC | PRN
  Administered 2019-02-01: 60 mg via INTRAVENOUS

## 2019-02-01 MED ORDER — BUPIVACAINE HCL (PF) 0.25 % IJ SOLN
INTRAMUSCULAR | Status: DC | PRN
  Administered 2019-02-01: 50 mL

## 2019-02-01 MED ORDER — ENALAPRILAT 1.25 MG/ML IV SOLN
0.6250 mg | Freq: Four times a day (QID) | INTRAVENOUS | Status: DC | PRN
  Filled 2019-02-01: qty 1

## 2019-02-01 MED ORDER — PROPOFOL 10 MG/ML IV BOLUS
INTRAVENOUS | Status: DC | PRN
  Administered 2019-02-01: 120 mg via INTRAVENOUS

## 2019-02-01 MED ORDER — FAMOTIDINE IN NACL 20-0.9 MG/50ML-% IV SOLN
20.0000 mg | Freq: Two times a day (BID) | INTRAVENOUS | Status: DC
  Administered 2019-02-01: 20 mg via INTRAVENOUS
  Filled 2019-02-01: qty 50

## 2019-02-01 MED ORDER — METHOCARBAMOL 1000 MG/10ML IJ SOLN
1000.0000 mg | Freq: Four times a day (QID) | INTRAVENOUS | Status: DC | PRN
  Filled 2019-02-01: qty 10

## 2019-02-01 MED ORDER — CHLORHEXIDINE GLUCONATE CLOTH 2 % EX PADS
6.0000 | MEDICATED_PAD | Freq: Every day | CUTANEOUS | Status: DC
  Administered 2019-02-02 – 2019-02-16 (×15): 6 via TOPICAL

## 2019-02-01 MED ORDER — SODIUM CHLORIDE 0.9% FLUSH
3.0000 mL | Freq: Once | INTRAVENOUS | Status: AC
  Administered 2019-02-09: 3 mL via INTRAVENOUS

## 2019-02-01 MED ORDER — LORAZEPAM 2 MG/ML IJ SOLN
0.5000 mg | Freq: Once | INTRAMUSCULAR | Status: AC
  Administered 2019-02-01: 12:00:00 0.5 mg via INTRAVENOUS
  Filled 2019-02-01: qty 1

## 2019-02-01 MED ORDER — LIP MEDEX EX OINT
1.0000 "application " | TOPICAL_OINTMENT | Freq: Two times a day (BID) | CUTANEOUS | Status: DC
  Administered 2019-02-01 – 2019-02-16 (×30): 1 via TOPICAL
  Filled 2019-02-01 (×7): qty 7

## 2019-02-01 MED ORDER — SODIUM CHLORIDE 0.9 % IV SOLN
8.0000 mg | Freq: Four times a day (QID) | INTRAVENOUS | Status: DC | PRN
  Filled 2019-02-01: qty 4

## 2019-02-01 MED ORDER — DEXAMETHASONE SODIUM PHOSPHATE 10 MG/ML IJ SOLN
INTRAMUSCULAR | Status: DC | PRN
  Administered 2019-02-01: 10 mg via INTRAVENOUS

## 2019-02-01 MED ORDER — LACTATED RINGERS IV SOLN: INTRAVENOUS | Status: AC

## 2019-02-01 MED ORDER — PHENYLEPHRINE 40 MCG/ML (10ML) SYRINGE FOR IV PUSH (FOR BLOOD PRESSURE SUPPORT)
PREFILLED_SYRINGE | INTRAVENOUS | Status: DC | PRN
  Administered 2019-02-01 (×4): 120 ug via INTRAVENOUS
  Administered 2019-02-01: 80 ug via INTRAVENOUS
  Administered 2019-02-01: 120 ug via INTRAVENOUS

## 2019-02-01 SURGICAL SUPPLY — 81 items
APPLIER CLIP 5 13 M/L LIGAMAX5 (MISCELLANEOUS)
APR CLP MED LRG 5 ANG JAW (MISCELLANEOUS)
BLADE HEX COATED 2.75 (ELECTRODE) ×3 IMPLANT
BLADE SURG SZ10 CARB STEEL (BLADE) ×3 IMPLANT
CABLE HIGH FREQUENCY MONO STRZ (ELECTRODE) ×3 IMPLANT
CELLS DAT CNTRL 66122 CELL SVR (MISCELLANEOUS) IMPLANT
CLIP APPLIE 5 13 M/L LIGAMAX5 (MISCELLANEOUS) IMPLANT
COVER MAYO STAND STRL (DRAPES) ×3 IMPLANT
COVER SURGICAL LIGHT HANDLE (MISCELLANEOUS) ×3 IMPLANT
DECANTER SPIKE VIAL GLASS SM (MISCELLANEOUS) ×3 IMPLANT
DRAPE LAPAROSCOPIC ABDOMINAL (DRAPES) ×3 IMPLANT
DRAPE SHEET LG 3/4 BI-LAMINATE (DRAPES) IMPLANT
DRAPE UTILITY XL STRL (DRAPES) ×3 IMPLANT
DRAPE WARM FLUID 44X44 (DRAPES) ×3 IMPLANT
DRSG OPSITE POSTOP 4X10 (GAUZE/BANDAGES/DRESSINGS) IMPLANT
DRSG OPSITE POSTOP 4X6 (GAUZE/BANDAGES/DRESSINGS) IMPLANT
DRSG OPSITE POSTOP 4X8 (GAUZE/BANDAGES/DRESSINGS) IMPLANT
DRSG TEGADERM 2-3/8X2-3/4 SM (GAUZE/BANDAGES/DRESSINGS) ×7 IMPLANT
DRSG TEGADERM 4X4.75 (GAUZE/BANDAGES/DRESSINGS) ×3 IMPLANT
DRSG VAC ATS MED SENSATRAC (GAUZE/BANDAGES/DRESSINGS) ×1 IMPLANT
ELECT REM PT RETURN 15FT ADLT (MISCELLANEOUS) ×3 IMPLANT
ENDOLOOP SUT PDS II  0 18 (SUTURE)
ENDOLOOP SUT PDS II 0 18 (SUTURE) IMPLANT
GAUZE SPONGE 2X2 8PLY STRL LF (GAUZE/BANDAGES/DRESSINGS) ×2 IMPLANT
GAUZE SPONGE 4X4 12PLY STRL (GAUZE/BANDAGES/DRESSINGS) IMPLANT
GLOVE BIOGEL PI IND STRL 7.0 (GLOVE) IMPLANT
GLOVE BIOGEL PI IND STRL 7.5 (GLOVE) IMPLANT
GLOVE BIOGEL PI IND STRL 8 (GLOVE) IMPLANT
GLOVE BIOGEL PI INDICATOR 7.0 (GLOVE) ×1
GLOVE BIOGEL PI INDICATOR 7.5 (GLOVE) ×3
GLOVE BIOGEL PI INDICATOR 8 (GLOVE) ×1
GLOVE ECLIPSE 8.0 STRL XLNG CF (GLOVE) ×5 IMPLANT
GLOVE INDICATOR 8.0 STRL GRN (GLOVE) ×3 IMPLANT
GOWN STRL REUS W/TWL XL LVL3 (GOWN DISPOSABLE) ×11 IMPLANT
HANDLE SUCTION POOLE (INSTRUMENTS) ×2 IMPLANT
HIBICLENS CHG 4% 4OZ BTL (MISCELLANEOUS) ×1 IMPLANT
IRRIG SUCT STRYKERFLOW 2 WTIP (MISCELLANEOUS) ×3
IRRIGATION SUCT STRKRFLW 2 WTP (MISCELLANEOUS) ×2 IMPLANT
KIT BASIN OR (CUSTOM PROCEDURE TRAY) ×3 IMPLANT
KIT TURNOVER KIT A (KITS) IMPLANT
LEGGING LITHOTOMY PAIR STRL (DRAPES) ×3 IMPLANT
LIGASURE IMPACT 36 18CM CVD LR (INSTRUMENTS) ×1 IMPLANT
PAD POSITIONING PINK XL (MISCELLANEOUS) ×3 IMPLANT
PENCIL SMOKE EVACUATOR (MISCELLANEOUS) IMPLANT
PROTECTOR NERVE ULNAR (MISCELLANEOUS) ×3 IMPLANT
RETRACTOR WND ALEXIS 18 MED (MISCELLANEOUS) IMPLANT
RETRACTOR WND ALEXIS 25 LRG (MISCELLANEOUS) IMPLANT
RTRCTR WOUND ALEXIS 18CM MED (MISCELLANEOUS)
RTRCTR WOUND ALEXIS 25CM LRG (MISCELLANEOUS) ×3
SCISSORS LAP 5X35 DISP (ENDOMECHANICALS) ×3 IMPLANT
SEALER TISSUE G2 STRG ARTC 35C (ENDOMECHANICALS) IMPLANT
SET TUBE SMOKE EVAC HIGH FLOW (TUBING) ×3 IMPLANT
SLEEVE XCEL OPT CAN 5 100 (ENDOMECHANICALS) ×6 IMPLANT
SPONGE GAUZE 2X2 STER 10/PKG (GAUZE/BANDAGES/DRESSINGS) ×1
SPONGE LAP 18X18 RF (DISPOSABLE) ×1 IMPLANT
STAPLER 90 3.5 STAND SLIM (STAPLE) ×3
STAPLER 90 3.5 STD SLIM (STAPLE) IMPLANT
STAPLER PROXIMATE 75MM BLUE (STAPLE) ×1 IMPLANT
SUCTION POOLE HANDLE (INSTRUMENTS) ×3
SURGILUBE 2OZ TUBE FLIPTOP (MISCELLANEOUS) IMPLANT
SUT MNCRL AB 4-0 PS2 18 (SUTURE) ×3 IMPLANT
SUT PDS AB 1 CTX 36 (SUTURE) ×6 IMPLANT
SUT PDS AB 1 TP1 96 (SUTURE) IMPLANT
SUT PROLENE 0 CT 2 (SUTURE) IMPLANT
SUT SILK 2 0 (SUTURE) ×3
SUT SILK 2 0 SH CR/8 (SUTURE) ×3 IMPLANT
SUT SILK 2-0 18XBRD TIE 12 (SUTURE) ×2 IMPLANT
SUT SILK 3 0 (SUTURE) ×3
SUT SILK 3 0 SH CR/8 (SUTURE) ×3 IMPLANT
SUT SILK 3-0 18XBRD TIE 12 (SUTURE) ×2 IMPLANT
SYR BULB IRRIGATION 50ML (SYRINGE) ×3 IMPLANT
SYS LAPSCP GELPORT 120MM (MISCELLANEOUS)
SYSTEM LAPSCP GELPORT 120MM (MISCELLANEOUS) IMPLANT
TAPE UMBILICAL COTTON 1/8X30 (MISCELLANEOUS) IMPLANT
TOWEL OR 17X26 10 PK STRL BLUE (TOWEL DISPOSABLE) ×6 IMPLANT
TOWEL OR NON WOVEN STRL DISP B (DISPOSABLE) ×3 IMPLANT
TRAY FOLEY MTR SLVR 14FR STAT (SET/KITS/TRAYS/PACK) ×1 IMPLANT
TRAY LAPAROSCOPIC (CUSTOM PROCEDURE TRAY) ×3 IMPLANT
TROCAR BLADELESS OPT 5 100 (ENDOMECHANICALS) ×3 IMPLANT
TROCAR XCEL NON-BLD 11X100MML (ENDOMECHANICALS) IMPLANT
YANKAUER SUCT BULB TIP 10FT TU (MISCELLANEOUS) ×3 IMPLANT
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
## 2019-02-01 NOTE — Anesthesia Procedure Notes (Addendum)
Procedure Name: Intubation Date/Time: 02/01/2019 7:48 PM Performed by: Silas Sacramento, CRNA Pre-anesthesia Checklist: Patient identified, Emergency Drugs available, Suction available and Patient being monitored Patient Re-evaluated:Patient Re-evaluated prior to induction Oxygen Delivery Method: Circle system utilized Preoxygenation: Pre-oxygenation with 100% oxygen Induction Type: IV induction, Rapid sequence and Cricoid Pressure applied Laryngoscope Size: Mac and 4 Grade View: Grade I Tube type: Oral Tube size: 7.0 mm Number of attempts: 1 Airway Equipment and Method: Stylet Placement Confirmation: ETT inserted through vocal cords under direct vision,  positive ETCO2 and breath sounds checked- equal and bilateral Secured at: 22 cm Tube secured with: Tape Dental Injury: Teeth and Oropharynx as per pre-operative assessment

## 2019-02-01 NOTE — ED Notes (Signed)
Son of patient Megan Hester called and would like an update from the doctor or nurse, [phone].

## 2019-02-01 NOTE — Anesthesia Postprocedure Evaluation (Signed)
Anesthesia Post Note  Patient: Jett Routh Cichy  Procedure(s) Performed: LAPAROSCOPY DIAGNOSTIC (N/A Abdomen) LYSIS OF ADHESION (N/A Abdomen) LAPAROSCOPIC SMALL BOWEL RESECTION WITH TAP BLOCK (N/A Abdomen) APPLICATION OF WOUND VAC (Bilateral Abdomen)     Patient location during evaluation: PACU Anesthesia Type: General Level of consciousness: awake and alert Pain management: pain level controlled Vital Signs Assessment: post-procedure vital signs reviewed and stable Respiratory status: spontaneous breathing, nonlabored ventilation and respiratory function stable Cardiovascular status: blood pressure returned to baseline and stable Postop Assessment: no apparent nausea or vomiting Anesthetic complications: no    Last Vitals:  Vitals:   02/01/19 2145 02/01/19 2200  BP: 127/63 122/67  Pulse: 65 74  Resp: 17 19  Temp: 36.9 C   SpO2: 100% 100%    Last Pain:  Vitals:   02/01/19 2200  TempSrc:   PainSc: Asleep                 Lynda Rainwater

## 2019-02-01 NOTE — Transfer of Care (Signed)
Immediate Anesthesia Transfer of Care Note  Patient: Megan Hester  Procedure(s) Performed: LAPAROSCOPY DIAGNOSTIC (N/A Abdomen) LYSIS OF ADHESION (N/A Abdomen) LAPAROSCOPIC SMALL BOWEL RESECTION WITH TAP BLOCK (N/A Abdomen) APPLICATION OF WOUND VAC (Bilateral Abdomen)  Patient Location: PACU  Anesthesia Type:General  Level of Consciousness: drowsy, patient cooperative and responds to stimulation  Airway & Oxygen Therapy: Patient Spontanous Breathing and Patient connected to face mask oxygen  Post-op Assessment: Report given to RN and Post -op Vital signs reviewed and stable  Post vital signs: Reviewed and stable  Last Vitals:  Vitals Value Taken Time  BP 127/63 02/01/19 2145  Temp    Pulse 78 02/01/19 2147  Resp 17 02/01/19 2147  SpO2 100 % 02/01/19 2147  Vitals shown include unvalidated device data.  Last Pain:  Vitals:   02/01/19 1800  TempSrc:   PainSc: 6       Patients Stated Pain Goal: 4 ([id number])  Complications: No apparent anesthesia complications

## 2019-02-01 NOTE — Anesthesia Preprocedure Evaluation (Signed)
Anesthesia Evaluation  Patient identified by MRN, date of birth, ID band Patient awake    Reviewed: Allergy & Precautions, NPO status , Patient's Chart, lab work & pertinent test results  Airway Mallampati: II  TM Distance: >3 FB Neck ROM: Full    Dental no notable dental hx.    Pulmonary neg pulmonary ROS,    Pulmonary exam normal breath sounds clear to auscultation       Cardiovascular hypertension, Pt. on medications Normal cardiovascular exam+ dysrhythmias Atrial Fibrillation + pacemaker  Rhythm:Regular Rate:Normal     Neuro/Psych negative neurological ROS     GI/Hepatic Neg liver ROS, GERD  ,  Endo/Other  negative endocrine ROS  Renal/GU negative Renal ROS     Musculoskeletal  (+) Arthritis , Osteoarthritis,    Abdominal   Peds  Hematology negative hematology ROS (+)   Anesthesia Other Findings   Reproductive/Obstetrics                             Anesthesia Physical  Anesthesia Plan  ASA: III and emergent  Anesthesia Plan: General   Post-op Pain Management:    Induction: Intravenous, Rapid sequence and Cricoid pressure planned  PONV Risk Score and Plan: 3 and Ondansetron, Dexamethasone, Midazolam and Treatment may vary due to age or medical condition  Airway Management Planned: Oral ETT  Additional Equipment:   Intra-op Plan:   Post-operative Plan: Possible Post-op intubation/ventilation  Informed Consent: I have reviewed the patients History and Physical, chart, labs and discussed the procedure including the risks, benefits and alternatives for the proposed anesthesia with the patient or authorized representative who has indicated his/her understanding and acceptance.       Plan Discussed with: CRNA  Anesthesia Plan Comments:         Anesthesia Quick Evaluation

## 2019-02-01 NOTE — Progress Notes (Signed)
CRITICAL VALUE ALERT  Critical Value: Lactic Acid 2.9  Date & Time Notied: 02/01/19  Provider Notified: Dr Royetta Car, anesthesia (noted to Dr Johney Maine as well)  Orders Received/Actions taken: n/a

## 2019-02-01 NOTE — H&P (Signed)
History and Physical    Megan Hester [id number] DOB: Feb 04, 1931 DOA: 02/01/2019  PCP: [name], MD  Patient coming from: Home  Chief Complaint: Abd pain, nausea  HPI: Megan Hester is a 85 y.o. female with medical history significant of sick sinus syndrome, afib on chronic xarelto, HTN, GERD who presented to the ED with 1 day of abd discomfort and nausea/vomiting.  On further questioning, patient reports lower abd pain in the pubic region starting around 11am which gradually progressed up the abd, ultimately involving the epigastric region. Patient reported increased distension and gas. Symptoms somewhat relieved with Tums. Later in the day, patient noted nausea and vomiting. No prior surgeries. No fevers, chills, syncope.  ED Course: In the ED, pt noted to have presenting bp of 84/63, improved to sbp in the 120's with IVF hydration. Cr noted to be 1.4, WBC 24.6 with hgb of 14. Abd CT notable for closed loop obstruction in the lower abd with possible early ischemia. General Surgery was consulted. Hospitalist consulted for consideration for medical admission.  COVID pending per EDP  Review of Systems:  Review of Systems  Constitutional: Negative for chills, fever and malaise/fatigue.  HENT: Negative for ear discharge, ear pain and nosebleeds.   Eyes: Negative for double vision, photophobia and discharge.  Respiratory: Negative for hemoptysis, sputum production and shortness of breath.   Cardiovascular: Negative for palpitations, claudication and leg swelling.  Gastrointestinal: Positive for abdominal pain, nausea and vomiting.  Genitourinary: Negative for frequency, hematuria and urgency.  Musculoskeletal: Negative for back pain, falls, joint pain and neck pain.  Neurological: Negative for tingling, tremors, seizures, loss of consciousness and weakness.  Psychiatric/Behavioral: Negative for memory loss and substance abuse. The patient is not nervous/anxious and  does not have insomnia.     Past Medical History:  Diagnosis Date  . Arthritis    "all over my body" (02/25/2016)  . 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 GERD (gastroesophageal reflux disease)    hx; "when I was working"  . Hypertension   . Macular degeneration   . Paroxysmal atrial fibrillation (HCC)   . Presence of permanent cardiac pacemaker   . 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 BREAST BIOPSY Right   . BREAST LUMPECTOMY Right 1999   RADIATION  . CARDIAC CATHETERIZATION  02/05/1999   HYPERDYNAMIC LEFT VENTRICLE WITH EF OF 75-80%  . CARDIOVERSION N/A 01/22/2015   Procedure: CARDIOVERSION;  Surgeon: [name], MD;  Location: [facility] ENDOSCOPY;  Service: Cardiovascular;  Laterality: N/A;  . CARDIOVERSION N/A 02/22/2015   Procedure: CARDIOVERSION;  Surgeon: [name], MD;  Location: [facility] ENDOSCOPY;  Service: Cardiovascular;  Laterality: N/A;  . DILATION AND CURETTAGE OF UTERUS    . ENUCLEATION Right 2012  . EP IMPLANTABLE DEVICE N/A 02/25/2016   Procedure: Pacemaker Implant;  Surgeon: [name], MD;  Location: [facility];  Service: Cardiovascular;  Laterality: N/A;  . INSERT / REPLACE / REMOVE PACEMAKER    . TONSILLECTOMY       reports that she has never smoked. She has never used smokeless tobacco. She reports current alcohol use. She reports that she does not use drugs.  Allergies  Allergen Reactions  . Shellfish Allergy Hives and Swelling    Shrimp and lobster only  . Atropine     Causes AFIB  . Codeine     Leg  pain  . Doxycycline     unknown  . Fenoprofen Calcium Swelling  . Iodinated Diagnostic Agents Hives and Other (See Comments)  . Isopto Hyoscine [Scopolamine]     Causes AFIB  . Levaquin [Levofloxacin]     Rash & causes AFIB  . Other     Other reaction(s): Bleeding (intolerance) Nutrasweet  Naphon - swelling  . Oxycodone     Depression - pt tolerates  as needed   . Penicillins     Has patient had a PCN reaction causing immediate rash, facial/tongue/throat swelling, SOB or lightheadedness with hypotension: unknown Has patient had a PCN reaction causing severe rash involving mucus membranes or skin necrosis: unknown Has patient had a PCN reaction that required hospitalization unknown Has patient had a PCN reaction occurring within the last 10 years: childhood If all of the above answers are "NO", then may proceed with Cephalosporin use. unknown  . Sulfa Drugs Cross Reactors     Ankle swelling   . Warfarin     Other reaction(s): Bleeding (intolerance) From eyes  . Ceclor [Cefaclor] Hives  . Tape Rash    Use paper tape    Family History  Problem Relation Age of Onset  . Hypertension Father   . Heart attack Father   . Parkinsonism Father   . Diabetes Brother   . Heart disease Brother   . Lung cancer Brother   . Stroke Mother     Prior to Admission medications   Medication Sig Start Date End Date Taking? Authorizing Provider  acetaminophen (TYLENOL) 650 MG CR tablet Take 650 mg by mouth daily as needed for pain.   Yes [provider]  cetirizine (ZYRTEC) 10 MG tablet Take 10 mg by mouth at bedtime.    Yes [provider]  Cholecalciferol (VITAMIN D) 1000 UNITS capsule Take 1,000 Units by mouth at bedtime.    Yes [provider]  clobetasol cream (TEMOVATE) [id number] % Apply 1 application topically daily as needed (dermatitis).    Yes [provider]  diltiazem (CARDIZEM CD) 120 MG 24 hr capsule Take 1 capsule (120 mg total) by mouth daily. 10/12/18  Yes Allred, [name], MD  flecainide (TAMBOCOR) 100 MG tablet TAKE 1 TABLET BY MOUTH TWICE DAILY. Patient taking differently: Take 100 mg by mouth 2 (two) times daily.  06/30/18  Yes Allred, [name], MD  Glucosamine-Chondroitin (GLUCOSAMINE CHONDR COMPLEX PO) Take 1 capsule by mouth daily.   Yes [provider]  Hypromellose (ARTIFICIAL TEARS OP) Place 1  drop into the left eye 2 (two) times daily.    Yes [provider]  Multiple Vitamins-Minerals (CENTRUM SILVER PO) Take 1 capsule by mouth daily.    Yes [provider]  Multiple Vitamins-Minerals (PRESERVISION/LUTEIN PO) Take 1 capsule by mouth 2 (two) times daily.    Yes [provider]  neomycin-polymyxin-dexameth (MAXITROL) 0.1 % OINT Place 1 application into the right eye at bedtime.   Yes [provider]  Polyethyl Glycol-Propyl Glycol (SYSTANE OP) Place 1 drop into the right eye 2 (two) times daily.    Yes [provider]  polyethylene glycol (MIRALAX / GLYCOLAX) packet Take 17 g by mouth daily as needed for moderate constipation.    Yes [provider]  sertraline (ZOLOFT) 100 MG tablet Take 100 mg by mouth at bedtime.  08/20/17  Yes [provider]  XARELTO 20 MG TABS tablet TAKE 1 TABLET ONCE DAILY WITH SUPPER. Patient taking differently: Take 20 mg by mouth daily with  supper.  09/27/18  Yes [name], MD    Physical Exam: Vitals:   02/01/19 1200 02/01/19 1330 02/01/19 1401 02/01/19 1430  BP: 120/86 108/69 130/78 128/66  Pulse: 69 86 73 83  Resp: 20 (!) 25 (!) 23 (!) 28  Temp:      TempSrc:      SpO2: 98% 95% 98% 98%    Constitutional: NAD, calm, comfortable Vitals:   02/01/19 1200 02/01/19 1330 02/01/19 1401 02/01/19 1430  BP: 120/86 108/69 130/78 128/66  Pulse: 69 86 73 83  Resp: 20 (!) 25 (!) 23 (!) 28  Temp:      TempSrc:      SpO2: 98% 95% 98% 98%   Eyes: PERRL, lids and conjunctivae normal ENMT: Mucous membranes are moist. Posterior pharynx clear of any exudate or lesions.Normal dentition.  Neck: normal, supple, no masses, no thyromegaly Respiratory: clear to auscultation bilaterally, no wheezing Cardiovascular: Regular rate and rhythm,No extremity edema. 2+ pedal pulses.  Abdomen: generalized abd discomfort, nondistended Musculoskeletal: no clubbing / cyanosis. No joint deformity upper and lower  extremities. Good ROM, no contractures. Normal muscle tone.  Skin: no rashes, lesions, ulcers. No induration Neurologic: CN 2-12 grossly intact. Sensation intact, DTR normal. Strength 5/5 in all 4.  Psychiatric: Normal judgment and insight. Alert and oriented x 3. Normal mood.    Labs on Admission: I have personally reviewed following labs and imaging studies  CBC: Recent Labs  Lab 02/01/19 1056  WBC 24.6*  HGB 14.0  HCT 42.7  MCV 111.5*  PLT 123456   Basic Metabolic Panel: Recent Labs  Lab 02/01/19 1056  NA 138  K 4.4  CL 99  CO2 22  GLUCOSE 211*  BUN 27*  CREATININE 1.40*  CALCIUM 10.1   GFR: CrCl cannot be calculated (Unknown ideal weight.). Liver Function Tests: Recent Labs  Lab 02/01/19 1056  AST 24  ALT 16  ALKPHOS 146*  BILITOT 1.7*  PROT 7.1  ALBUMIN 4.0   Recent Labs  Lab 02/01/19 1056  LIPASE 26   No results for input(s): AMMONIA in the last 168 hours. Coagulation Profile: No results for input(s): INR, PROTIME in the last 168 hours. Cardiac Enzymes: No results for input(s): CKTOTAL, CKMB, CKMBINDEX, TROPONINI in the last 168 hours. BNP (last 3 results) No results for input(s): PROBNP in the last 8760 hours. HbA1C: No results for input(s): HGBA1C in the last 72 hours. CBG: No results for input(s): GLUCAP in the last 168 hours. Lipid Profile: No results for input(s): CHOL, HDL, LDLCALC, TRIG, CHOLHDL, LDLDIRECT in the last 72 hours. Thyroid Function Tests: No results for input(s): TSH, T4TOTAL, FREET4, T3FREE, THYROIDAB in the last 72 hours. Anemia Panel: No results for input(s): VITAMINB12, FOLATE, FERRITIN, TIBC, IRON, RETICCTPCT in the last 72 hours. Urine analysis:    Component Value Date/Time   COLORURINE YELLOW 08/23/2014 0951   APPEARANCEUR CLEAR 08/23/2014 0951   LABSPEC 1.016 08/23/2014 0951   PHURINE 5.5 08/23/2014 0951   GLUCOSEU NEG 08/23/2014 0951   HGBUR NEG 08/23/2014 0951   BILIRUBINUR NEG 08/23/2014 0951   KETONESUR NEG  08/23/2014 0951   PROTEINUR NEG 08/23/2014 0951   UROBILINOGEN 0.2 08/23/2014 0951   NITRITE NEG 08/23/2014 0951   LEUKOCYTESUR MOD (A) 08/23/2014 0951   Sepsis Labs: !!!!!!!!!!!!!!!!!!!!!!!!!!!!!!!!!!!!!!!!!!!! @LABRCNTIP (procalcitonin:4,lacticidven:4) )No results found for this or any previous visit (from the past 240 hour(s)).   Radiological Exams on Admission: CT Abdomen Pelvis Wo Contrast  Result Date: 02/01/2019 CLINICAL DATA:  85 year old female with nausea  and vomiting and abdominal pain. EXAM: CT ABDOMEN AND PELVIS WITHOUT CONTRAST TECHNIQUE: Multidetector CT imaging of the abdomen and pelvis was performed following the standard protocol without IV contrast. COMPARISON:  CT of the abdomen pelvis dated 05/21/2016. FINDINGS: Evaluation of this exam is limited in the absence of intravenous contrast. Lower chest: There is mild cardiomegaly. Partially visualized calcified mitral annulus. Cardiac pacemaker leads noted. The visualized lung bases are clear. No intra-abdominal free air. There is a small ascites. Hepatobiliary: The liver is grossly unremarkable. No intrahepatic biliary ductal dilatation. Minimal sludge may be present within the gallbladder. No calcified stone or pericholecystic fluid. Pancreas: The pancreas is grossly unremarkable. Spleen: Normal in size without focal abnormality. Adrenals/Urinary Tract: The adrenal glands are unremarkable. There is no hydronephrosis or nephrolithiasis on either side. Multiple bilateral renal hypodense lesions noted which are suboptimally characterized but the larger lesions demonstrate fluid attenuation and present on the prior CT most consistent with cysts. The visualized ureters appear unremarkable. The urinary bladder is minimally distended and grossly unremarkable. Stomach/Bowel: Multiple top-normal caliber and severely inflamed loops of small bowel in the lower abdomen and pelvis measure up to 2.8 cm in diameter. There is severe inflammatory  changes of the mesentery associated with this bowel as well as inflammatory changes of the bowel wall. Findings are concerning for a closed loop obstruction with probable point of obstruction in the left hemipelvis (series 2, image 66 and 67 and coronal series 5, image 63). There is an area of slight mesenteric twisting in the left hemiabdomen (series 5 images 34-53). And underlying omental defect with associated internal hernia is not excluded. Correlation with surgical history recommended. Several foci of air within the inflamed small bowel loops appear to be intraluminal. However, early ischemia is not excluded given the degree of inflammatory changes. Correlation with clinical exam and lactic acid levels recommended. There are scattered colonic diverticula without active inflammatory changes. The appendix is unremarkable as visualized. Vascular/Lymphatic: There is moderate aortoiliac atherosclerotic disease. The IVC is unremarkable. No portal venous gas. There is no adenopathy. Reproductive: The uterus is grossly unremarkable. No adnexal masses. Other: Small fat containing umbilical hernia. Musculoskeletal: Degenerative changes of the spine. No acute osseous pathology. IMPRESSION: 1. Findings concerning for closed loop obstruction in the lower abdomen/pelvis with possible early ischemia. No definite pneumatosis, portal venous gas, or free air at this time. Correlation with clinical exam, lactic acid levels, and surgical consult is advised. 2. Small ascites. 3. Colonic diverticulosis. 4. Aortic Atherosclerosis (ICD10-I70.0). These results were called by telephone at the time of interpretation on 02/01/2019 at 3:36 pm to physician assistant Alecia Lemming, who verbally acknowledged these results. Electronically Signed   By: Anner Crete M.D.   On: 02/01/2019 15:41     Assessment/Plan Principal Problem:   Bowel obstruction (HCC) Active Problems:   A-fib (HCC)   Hypertension   Sick sinus syndrome ([place])    Sepsis ([place])   1. Bowel obstruction 1. CT personally reviewed. Findings worrisome for bowel obstruction with transition zone noted 2. General Surgery consulted in ED, discussed with General Surgery. Plan for surgery tonight 3. EKG reviewed, no ischemic changes. BP currently stable. Will check CXR. If clear, then benefits to surgery would outweigh perioperative risk 4. Will continue NPO status 5. Cont analgesics and supportive care 6. Repeat cmp and cbc in AM 7. Pt seen with General Surgery at bedside. Per Surgery, recommendation for admission to stepdown level of care given high risk for perioperative decompensation 2. HTN 1. Noted  to be hypotensive, improved with IVF hydration 2. BP currently stable 3. Continue IVF hydration as tolerated 3. Hx afib and sick sinus syndrome 1. Followed by Dr. Rayann Heman 2. Currently rate controlled 3. Pt had been on xarelto prior to admit, will hold as surgery is planned for tonight. Consider resuming anticoagulation when OK with general surgery 4. On cardizem, flecainide prior to admit 5. As pt is NPO, will continue on scheduled IV lopressor for rate control with labetalol IV PRN for HR>120 4. Sepsis 1. Presenting with leukocytosis with tachypnea 2. Likely secondary to presenting obstruction 3. Will check blood cx and UA as well as CXR 4. Will cont current broad spec abx as started in ED given pt's numerous allergies 5. Repeat CBC in AM 5. ARF 1. Likely secondary to dehydration 2. Will continue with IVF as tolerated 3. Repeat bmet in AM  DVT prophylaxis: SCD  Code Status: Full Family Communication: Pt in room, family not at bedside  Disposition Plan: Uncertain at this time  Consults called: General Surgery Admission status: Inpatient, as would likely require greater than 2 midnight stay to resolve obstruction   Marylu Lund MD [facility] Pager On Amion  If 7PM-7AM, please contact night-coverage  02/01/2019, 4:37 PM

## 2019-02-01 NOTE — Progress Notes (Signed)
A consult was received from an ED physician for Meropenem per pharmacy dosing for intra-abdominal infection.    The patient's profile has been reviewed for ht/wt/allergies/indication/available labs.    A one time order has been placed for Meropenem 1gm IV.  Further antibiotics/pharmacy consults should be ordered by admitting physician if indicated.                       Thank you, Everette Rank, PharmD 02/01/2019  3:34 PM

## 2019-02-01 NOTE — ED Triage Notes (Signed)
Pt staes she has had N/V/D since last night. BP 84/63 in triage,

## 2019-02-01 NOTE — ED Provider Notes (Signed)
Signout from Dr. Zenia Resides.  85 year old female with 2 days of nausea vomiting diarrhea.  Diffuse abdominal cramping.  Initially tachycardic and hypotensive here improved with IV fluids.  Elevated white count.  Urinalysis and CT results are pending.  Plan for admission regardless of CT results. Physical Exam  BP 128/66   Pulse 83   Temp (!) 97.4 F (36.3 C) (Oral)   Resp (!) 28   SpO2 98%   Physical Exam Vitals and nursing note reviewed.  Constitutional:      General: She is not in acute distress.    Appearance: She is well-developed.  HENT:     Head: Normocephalic and atraumatic.  Eyes:     Conjunctiva/sclera: Conjunctivae normal.  Cardiovascular:     Rate and Rhythm: Normal rate.     Heart sounds: No murmur.  Pulmonary:     Effort: Pulmonary effort is normal. No respiratory distress.     Breath sounds: Normal breath sounds. No stridor. No wheezing.  Abdominal:     Palpations: Abdomen is soft. There is no mass.     Tenderness: There is abdominal tenderness (lower abdomen). There is no guarding.  Musculoskeletal:        General: No tenderness. Normal range of motion.     Cervical back: Neck supple.  Skin:    General: Skin is warm and dry.  Neurological:     Mental Status: She is alert.     GCS: GCS eye subscore is 4. GCS verbal subscore is 5. GCS motor subscore is 6.     ED Course/Procedures     .Critical Care Performed by: Hayden Rasmussen, MD Authorized by: Hayden Rasmussen, MD   Critical care provider statement:    Critical care time (minutes):  45   Critical care time was exclusive of:  Separately billable procedures and treating other patients   Critical care was necessary to treat or prevent imminent or life-threatening deterioration of the following conditions: intrabdominal infection/surgical issue.   Critical care was time spent personally by me on the following activities:  Discussions with consultants, evaluation of patient's response to treatment,  examination of patient, ordering and performing treatments and interventions, ordering and review of laboratory studies, ordering and review of radiographic studies, pulse oximetry, re-evaluation of patient's condition, review of old charts, development of treatment plan with patient or surrogate and obtaining history from patient or surrogate    MDM  Patient CT interpreted by me is markedly abnormal with free fluid and bowel wall thickening.  Have ordered her meropenem for empiric intra-abdominal coverage as the patient has multiple allergies.  Awaiting CT reading.  4:05 PM.  Received call back from Will Oakwood from general surgery.  He is recommending the patient be admitted to medical service and he will review the CT with his attending.  Paged hospitalist for admission.  I updated the patient's son Kyndal [phone].   Surgical attending now taking patient to OR.   Ischemic bowel, closed loop obstruction   Hayden Rasmussen, MD 02/02/19 1209

## 2019-02-01 NOTE — Op Note (Signed)
02/01/2019  9:32 PM  PATIENT:  Megan Hester  85 y.o. female  Patient Care Team: [name], MD as PCP - General (Internal Medicine) [name], MD as Consulting Physician (Cardiology) Fontaine, Belinda Block, MD as Consulting Physician (Gynecology) Gerarda Fraction, MD as Referring Physician (Ophthalmology)  PRE-OPERATIVE DIAGNOSIS:  CLOSED LOOP BOWEL OBSTRUCTION  POST-OPERATIVE DIAGNOSIS:   CLOSED LOOP BOWEL OBSTRUCTION WITH ISCHEMIA AND EARLY PERFORATION UMBILICAL HERNIA  PROCEDURE:   LAPAROSCOPY DIAGNOSTIC WITH LYSIS OF ADHESION SMALL BOWEL RESECTION TAP BLOCK PRIMARY UMBILICAL HERNIA REPAIR APPLICATION OF WOUND VAC  SURGEON:  Adin Hector, MD  ASSISTANT: OR Staff   ANESTHESIA:     General  Nerve block provided with liposomal bupivacaine (Experel) mixed with 0.25% bupivacaine as a Bilateral TAP block x 27mL each side at the level of the transverse abdominis & preperitoneal spaces along the flank at the anterior axillary line, from subcostal ridge to iliac crest under laparoscopic guidance   Local field block at port sites & extraction wound  EBL:  Total I/O In: 107.8 [IV Piggyback:107.8] Out: 150 [Urine:50; Blood:100]  Delay start of Pharmacological VTE agent (>24hrs) due to surgical blood loss or risk of bleeding:  no  DRAINS: none   SPECIMEN: Necrotic ileum  DISPOSITION OF SPECIMEN:  PATHOLOGY  COUNTS:  YES  PLAN OF CARE: Admit to inpatient   PATIENT DISPOSITION:  PACU - guarded condition.  INDICATION:    Pleasant elderly woman with no prior abdominal surgery.  Had worsening crampy abdominal pain for over a day.  Came to emergency room.  Not in shock but lower abdominal pain with some concerns for peritonitis.  CT scan showed area of edema and bowel wall thickening suspicious for closed-loop bowel obstruction.  I recommended emergent laparoscopic possible open exploration.  Possible bowel resection.    The anatomy & physiology of the  digestive tract was discussed.  The pathophysiology of perforation was discussed.  Differential diagnosis such as perforated ulcer or colon, etc was discussed.   Natural history risks without surgery such as death was discussed.  I recommended abdominal exploration to diagnose & treat the source of the problem.  Laparoscopic & open techniques were discussed.   Risks such as bleeding, infection, abscess, leak, reoperation, bowel resection, possible ostomy, injury to other organs, need for repair of tissues / organs, hernia, heart attack, death, and other risks were discussed.   The risks of no intervention will lead to serious problems including death.   I expressed a good likelihood that surgery will address the problem.    Goals of post-operative recovery were discussed as well.  We will work to minimize complications although risks in an emergent setting are high.   Questions were answered.  Discussed with the patient at the bedside.  Also called & discussed with the patient's son as well.  Both agreed with proceeding with surgery    OR FINDINGS:   Patient had sweeping loop as well as some chronic folds of ileum in the pelvis trapped with an adhesive band between the left fallopian tube and sigmoid colon epiploic appendage.  Inflamed friable with small perforations consistent with necrosis.  Closed-loop obstruction.  Fecalization proximal to this region suspicious for chronic partial small bowel obstruction from internal hernia due to adhesive band.   It is an ileo-ileal side-to-side stapled antiperistaltic anastomosis   DESCRIPTION:   Informed consent was confirmed.  The patient underwent general anaesthesia without difficulty.  The patient was positioned with arms tucked & secured appropriately.  VTE prevention in place.  The patient's abdomen was clipped, prepped, & draped in a sterile fashion.  Surgical timeout confirmed our plan.  The patient was positioned in reverse Trendelenburg.  Abdominal  entry was gained using optical entry technique in the left upper abdomen.  Entry was clean.  I induced carbon dioxide insufflation.  Camera inspection revealed no injury.  Extra ports were carefully placed under direct laparoscopic visualization.    Could see omental adhesions to a small umbilical hernia.  This was freed off.  All some so omental adhesions on the left upper quadrant are freed off the anterior abdominal wall.  I could see some discolored injected small bowel down the pelvis.  Try to run the small bowel from the ileocecal valve more proximally.  It was decompressed.  Came apparent that at least of small bowel was extremely ischemic and probably necrotic.  I tried to mobilize it but there already spots of microperforation.  I converted to an open low midline incision and placed a wound protector.  Is able to feel and find and free up and adhesions between the sigmoid colon and left adnexa.  That helped release the internal hernia.  I was able to eviscerate the small intestine.  Very inflamed edematous small bowel with necrosis and microperforations.  It was extremely friable.  I did not think it was salvageable.  There were clear demarcations proximally distally.  There were areas that had perforated.  I controlled the spillage and was able to remove most of the frank pieces.   I transected with a Barcelona type anastomosis.  I used a 29 GIA to do a side-to-side antiperistaltic anastomosis from proximal to mid ileum.  There is frank fecalization in the small intestine proximal to this anastomosis.  Help milk out and removed that.  You suction to aspirate some of the excess succus is more proximally.  The small bowel seemed happier when it site was decompressed.  I then used a TX 90 used to staple across the common staple defect and transect the small bowel.  Then transected the mesentery of the ischemic and necrotic bowel using a LigaSure bipolar system.  Try to stay closer to the small intestine  although there was areas of ischemia and necrosis in the ileal mesentery as well.  Specimen removed.  Matrix is removed all particular matter that it perforated.  Irrigated several liters until things were clear.  I switched to clean gloves.  I placed a 2-0 silk suture at the proximal end of the anastomosis at its crotch for an antitension stitch.  I closed the mesenteric defect transversely to you as as a mesenteric pexy over the Spencer staple line to protect it.  Assistant viable and healthy.  I returned the intestine back to the abdomen.  We did copious irrigation of another 8 L with clear return at the end.  Inspected the rest the abdomen.  She did have redundant sigmoid colon without volvulus.  Clear there was adhesive band from the epiploic appendages that seem to have a similar scar tab on the left adnexa/fallopian tube.  This seemed to be where the adhesive band had been.  I could feel no mass or tumor.  The ovary were atrophic.  Uterus was atrophic as well.  No hard mass or nodules.  Colon full of some soft stool.  Nasogastric tube was repositioned and advanced along the greater curvature to rest in the antrum.  Secured at the nare  We changed gloves again.  I brought the greater omentum down from the upper abdomen to lay over the lower abdomen.  It was an optically large volume, but enough of the tongue could at least lay just underneath the infraumbilical incision.  I closed the fascia using #1 PDS in a running fashion from each corner.  I used that upper part of the stitch to primarily close the umbilical hernia near the apex.  I did get good fascial stitches proximal to the umbilical hernia supraumbilically before running down to cover the umbilical hernia and closed the infraumbilical fascia.  I then irrigated the subcutaneous wound.  I closed the 5 mm port sites with Monocryl and placed sterile dressinges.  Because of her advanced age with ischemia and perforation in the setting of emergency surgery,  I did not feel it was wise to close the skin at the midline incision as likelihood of wound infection was rather high.  So, I left the wound open.  I did place an incisional wound VAC over it and it held negative pressure suction.  Patient was hemodynamically stable throughout the case and was making urine.  Because of her advanced age and significant cardiac issues of atrial fibrillation, Xarelto anticoagulation, pacemaker; I felt it be wise to have the patient least be in the stepdown unit overnight to make sure there is no hemodynamic injuries or compromise that may happened postoperatively compromise.  Medicine to help manage.  Low threshold to involve cardiology.  Patient is being extubated go to recovery room.I discussed operative findings, updated the patient's status, discussed probable steps to recovery, and gave postoperative recommendations to the patient's son.  Recommendations were made.  Questions were answered.  He expressed understanding & appreciation.   Adin Hector, M.D., F.A.C.S. Gastrointestinal and Minimally Invasive Surgery [facility] [address] [place], [place] [address] [phone] Main / Paging

## 2019-02-01 NOTE — ED Provider Notes (Signed)
[facility] DEPT Provider Note   CSN: [id number] Arrival date & time: 02/01/19  1032     History Chief Complaint  Patient presents with  . Emesis  . Diarrhea    Coraima Gebhardt is a 85 y.o. female.  85 year old female presents with 2 days of nausea and vomiting and diarrhea.  Has had diffuse abdominal crampiness without fever or chills.  No black or bloody stools.  Emesis has been nonbilious or bloody.  Abdominal pain is not localized and persistent.  Denies any urinary symptoms.  No treatment use prior to arrival.        Past Medical History:  Diagnosis Date  . Arthritis    "all over my body" (02/25/2016)  . 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 GERD (gastroesophageal reflux disease)    hx; "when I was working"  . Hypertension   . Macular degeneration   . Paroxysmal atrial fibrillation ([facility])   . Presence of permanent cardiac pacemaker   . 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 Pseudophakia of left eye 04/03/2016  . Sick sinus syndrome ([place]) 02/25/2016  . Cancer ([place])   . Shortness of breath 11/17/2011  . 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 Nuclear cataract 04/10/2011  . Age-related macular degeneration 12/03/2010  . 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 History of enucleation of right eyeball 12/03/2010  . Hypertension 09/12/2010  . A-fib ([place]) 07/16/2010    Past Surgical History:  Procedure Laterality Date  . BREAST BIOPSY Right   . BREAST LUMPECTOMY Right 1999   RADIATION  . CARDIAC CATHETERIZATION  02/05/1999   HYPERDYNAMIC LEFT VENTRICLE WITH EF OF 75-80%  . CARDIOVERSION N/A 01/22/2015   Procedure: CARDIOVERSION;  Surgeon: [name], MD;  Location: [facility] ENDOSCOPY;  Service: Cardiovascular;  Laterality: N/A;  . CARDIOVERSION N/A 02/22/2015   Procedure: CARDIOVERSION;   Surgeon: [name], MD;  Location: [facility] ENDOSCOPY;  Service: Cardiovascular;  Laterality: N/A;  . DILATION AND CURETTAGE OF UTERUS    . ENUCLEATION Right 2012  . EP IMPLANTABLE DEVICE N/A 02/25/2016   Procedure: Pacemaker Implant;  Surgeon: [name], MD;  Location: [facility] CV LAB;  Service: Cardiovascular;  Laterality: N/A;  . INSERT / REPLACE / REMOVE PACEMAKER    . TONSILLECTOMY       OB History    Gravida  3   Para  3   Term  3   Preterm      AB      Living  3     SAB      TAB      Ectopic      Multiple      Live Births              Family History  Problem Relation Age of Onset  . Hypertension Father   . Heart attack Father   . Parkinsonism Father   . Diabetes Brother   . Heart disease Brother   . Lung cancer Brother   . Stroke Mother     Social History   Tobacco Use  . Smoking status: Never Smoker  . Smokeless tobacco: Never Used  Substance Use Topics  . Alcohol use: Yes    Alcohol/week: 0.0 standard drinks    Comment: 02/25/2016 "glass of wine/month"  . Drug use: No  Home Medications Prior to Admission medications   Medication Sig Start Date End Date Taking? Authorizing Provider  acetaminophen (TYLENOL) 650 MG CR tablet Take 650 mg by mouth daily as needed for pain.    [provider]  calcium carbonate (TUMS - DOSED IN MG ELEMENTAL CALCIUM) 500 MG chewable tablet Chew 1-2 tablets by mouth daily as needed for indigestion or heartburn.    [provider]  cetirizine (ZYRTEC) 10 MG tablet Take 10 mg by mouth at bedtime.     [provider]  Cholecalciferol (VITAMIN D) 1000 UNITS capsule Take 1,000 Units by mouth at bedtime.     [provider]  clobetasol cream (TEMOVATE) [id number]-[id number] % Apply 1 application topically daily as needed (As directed).    [provider]  diltiazem (CARDIZEM CD) 120 MG 24 hr capsule Take 1 capsule (120 mg total) by mouth daily. 10/12/18   Allred, [name], MD  fexofenadine  (ALLEGRA) 180 MG tablet Take 180 mg by mouth daily as needed for allergies.     [provider]  flecainide (TAMBOCOR) 100 MG tablet Take 1 tablet (100 mg total) by mouth 2 (two) times daily. 06/02/17   Allred, [name], MD  flecainide (TAMBOCOR) 100 MG tablet TAKE 1 TABLET BY MOUTH TWICE DAILY. 06/30/18   Allred, [name], MD  Glucosamine-Chondroitin (GLUCOSAMINE CHONDR COMPLEX PO) Take 1 capsule by mouth daily.    [provider]  HYDROcodone-acetaminophen (NORCO/VICODIN) 5-325 MG tablet Take 1 tablet by mouth every 4 (four) hours as needed for moderate pain or severe pain. 12/14/18   Charlann Lange, PA-C  Hypromellose (ARTIFICIAL TEARS OP) Place 1 drop into the left eye 3 (three) times daily.    [provider]  Multiple Vitamins-Minerals (CENTRUM SILVER PO) Take 1 capsule by mouth daily.     [provider]  Multiple Vitamins-Minerals (PRESERVISION/LUTEIN PO) Take 1 capsule by mouth 2 (two) times daily.     [provider]  neomycin-polymyxin-dexameth (MAXITROL) 0.1 % OINT Place 1 application into the right eye at bedtime.    [provider]  oxybutynin (DITROPAN) 5 MG tablet Take 5 mg by mouth 3 (three) times daily as needed for bladder spasms.     [provider]  Polyethyl Glycol-Propyl Glycol (SYSTANE OP) Place 1 drop into the right eye 3 (three) times daily.    [provider]  polyethylene glycol (MIRALAX / GLYCOLAX) packet Take 17 g by mouth daily as needed for moderate constipation.     [provider]  sertraline (ZOLOFT) 100 MG tablet Take 100 mg by mouth at bedtime.  08/20/17   [provider]  sertraline (ZOLOFT) 50 MG tablet Take 50 mg by mouth daily.    [provider]  triamcinolone cream (KENALOG) 0.1 % Apply 1 application topically daily as needed (rash).    [provider]  XARELTO 20 MG TABS tablet TAKE 1 TABLET ONCE DAILY WITH SUPPER. 09/27/18   Allred, [name], MD    Allergies     Shellfish allergy, Atropine, Codeine, Doxycycline, Fenoprofen calcium, Iodinated diagnostic agents, Isopto hyoscine [scopolamine], Levaquin [levofloxacin], Other, Oxycodone, Penicillins, Sulfa drugs cross reactors, Warfarin, Ceclor [cefaclor], and Tape  Review of Systems   Review of Systems  All other systems reviewed and are negative.   Physical Exam Updated Vital Signs BP 112/87   Pulse 67   Temp (!) 97.4 F (36.3 C) (Oral)   Resp 18   SpO2 98%   Physical Exam Vitals and nursing note reviewed.  Constitutional:  General: She is not in acute distress.    Appearance: Normal appearance. She is well-developed. She is not toxic-appearing.  HENT:     Head: Normocephalic and atraumatic.  Eyes:     General: Lids are normal.     Conjunctiva/sclera: Conjunctivae normal.     Pupils: Pupils are equal, round, and reactive to light.  Neck:     Thyroid: No thyroid mass.     Trachea: No tracheal deviation.  Cardiovascular:     Rate and Rhythm: Normal rate and regular rhythm.     Heart sounds: Normal heart sounds. No murmur. No gallop.   Pulmonary:     Effort: Pulmonary effort is normal. No respiratory distress.     Breath sounds: Normal breath sounds. No stridor. No decreased breath sounds, wheezing, rhonchi or rales.  Abdominal:     General: Bowel sounds are normal. There is no distension.     Palpations: Abdomen is soft.     Tenderness: There is generalized abdominal tenderness. There is guarding. There is no rebound.  Musculoskeletal:        General: No tenderness. Normal range of motion.     Cervical back: Normal range of motion and neck supple.  Skin:    General: Skin is warm and dry.     Findings: No abrasion or rash.  Neurological:     Mental Status: She is alert and oriented to person, place, and time.     GCS: GCS eye subscore is 4. GCS verbal subscore is 5. GCS motor subscore is 6.     Cranial Nerves: No cranial nerve deficit.     Sensory: No sensory deficit.   Psychiatric:        Speech: Speech normal.        Behavior: Behavior normal.     ED Results / Procedures / Treatments   Labs (all labs ordered are listed, but only abnormal results are displayed) Labs Reviewed  LIPASE, BLOOD  COMPREHENSIVE METABOLIC PANEL  CBC  URINALYSIS, ROUTINE W REFLEX MICROSCOPIC    EKG None  Radiology No results found.  Procedures Procedures (including critical care time)  Medications Ordered in ED Medications  sodium chloride flush (NS) 0.9 % injection 3 mL (3 mLs Intravenous Not Given 02/01/19 1118)  0.9 %  sodium chloride infusion (has no administration in time range)  sodium chloride 0.9 % bolus 1,000 mL (1,000 mLs Intravenous New Bag/Given 02/01/19 1119)    ED Course  I have reviewed the triage vital signs and the nursing notes.  Pertinent labs & imaging results that were available during my care of the patient were reviewed by me and considered in my medical decision making (see chart for details).    MDM Rules/Calculators/A&P                     Patient treated with fluids as well as given Ativan for emesis.  Significant leukocytosis on patient's CBC.  Abdominal CT pending.  Dr. Melina Copa to follow Final Clinical Impression(s) / ED Diagnoses Final diagnoses:  None    Rx / DC Orders ED Discharge Orders    None       Lacretia Leigh, MD 02/01/19 1525

## 2019-02-02 LAB — ABO/RH: ABO/RH(D): O NEG

## 2019-02-02 LAB — COMPREHENSIVE METABOLIC PANEL
ALT: 11 U/L (ref 0–44)
AST: 20 U/L (ref 15–41)
Albumin: 3 g/dL — ABNORMAL LOW (ref 3.5–5.0)
Alkaline Phosphatase: 98 U/L (ref 38–126)
Anion gap: 10 (ref 5–15)
BUN: 33 mg/dL — ABNORMAL HIGH (ref 8–23)
CO2: 20 mmol/L — ABNORMAL LOW (ref 22–32)
Calcium: 8.6 mg/dL — ABNORMAL LOW (ref 8.9–10.3)
Chloride: 108 mmol/L (ref 98–111)
Creatinine, Ser: 1.32 mg/dL — ABNORMAL HIGH (ref 0.44–1.00)
GFR calc Af Amer: 42 mL/min — ABNORMAL LOW (ref >60)
GFR calc non Af Amer: 36 mL/min — ABNORMAL LOW (ref >60)
Glucose, Bld: 160 mg/dL — ABNORMAL HIGH (ref 70–99)
Potassium: 4.6 mmol/L (ref 3.5–5.1)
Sodium: 138 mmol/L (ref 135–145)
Total Bilirubin: 1.1 mg/dL (ref 0.3–1.2)
Total Protein: 5.6 g/dL — ABNORMAL LOW (ref 6.5–8.1)

## 2019-02-02 LAB — CBC
HCT: 41.4 % (ref 36.0–46.0)
Hemoglobin: 13.4 g/dL (ref 12.0–15.0)
MCH: 35.6 pg — ABNORMAL HIGH (ref 26.0–34.0)
MCHC: 32.4 g/dL (ref 30.0–36.0)
MCV: 110.1 fL — ABNORMAL HIGH (ref 80.0–100.0)
Platelets: 185 10*3/uL (ref 150–400)
RBC: 3.76 MIL/uL — ABNORMAL LOW (ref 3.87–5.11)
RDW: 15 % (ref 11.5–15.5)
WBC: 8.8 10*3/uL (ref 4.0–10.5)
nRBC: 0.3 % — ABNORMAL HIGH (ref 0.0–0.2)

## 2019-02-02 LAB — TYPE AND SCREEN
ABO/RH(D): O NEG
Antibody Screen: NEGATIVE

## 2019-02-02 LAB — MRSA PCR SCREENING: MRSA by PCR: NEGATIVE

## 2019-02-02 LAB — MAGNESIUM: Magnesium: 2.1 mg/dL (ref 1.7–2.4)

## 2019-02-02 LAB — HEMOGLOBIN A1C
Hgb A1c MFr Bld: 5.1 % (ref 4.8–5.6)
Mean Plasma Glucose: 99.67 mg/dL

## 2019-02-02 MED ORDER — MENTHOL 3 MG MT LOZG: 1.0000 | LOZENGE | OROMUCOSAL | Status: DC | PRN

## 2019-02-02 MED ORDER — FAMOTIDINE IN NACL 20-0.9 MG/50ML-% IV SOLN
20.0000 mg | INTRAVENOUS | Status: DC
  Administered 2019-02-02: 20 mg via INTRAVENOUS
  Filled 2019-02-02: qty 50

## 2019-02-02 MED ORDER — ACETAMINOPHEN 10 MG/ML IV SOLN
1000.0000 mg | Freq: Four times a day (QID) | INTRAVENOUS | Status: AC
  Administered 2019-02-02 – 2019-02-03 (×4): 1000 mg via INTRAVENOUS
  Filled 2019-02-02 (×4): qty 100

## 2019-02-02 MED ORDER — ENOXAPARIN SODIUM 30 MG/0.3ML ~~LOC~~ SOLN
30.0000 mg | Freq: Every day | SUBCUTANEOUS | Status: DC
  Administered 2019-02-02 – 2019-02-03 (×2): 30 mg via SUBCUTANEOUS
  Filled 2019-02-02 (×2): qty 0.3

## 2019-02-02 MED ORDER — PHENOL 1.4 % MT LIQD
1.0000 | OROMUCOSAL | Status: DC | PRN
  Filled 2019-02-02: qty 177

## 2019-02-02 NOTE — Progress Notes (Signed)
Brief Hx:  Megan Hester is a 85 y.o. female with medical history significant of sick sinus syndrome, afib on chronic xarelto, HTN, GERD who presented to the ED with 1 day of abd discomfort and nausea/vomiting.  ED, pt noted to have presenting bp of 84/63, improved to sbp in the 120's with IVF hydration. Cr noted to be 1.4, WBC 24.6 with hgb of 14. Abd CT notable for closed loop obstruction in the lower abd with possible early ischemia.  S/P Lap Surgery for SBP.   Subjective: Patient is status post abdominal surgery today, postop day one.  Patient was complaining of some sore throat and right shoulder pain.  Has not passed any gas this yet.  Denies any fever.  Mild abdominal pain.  Objective: Vital signs in last 24 hours: Temp:  [97.3 F (36.3 C)-98.5 F (36.9 C)] 98 F (36.7 C) (12/23 0803) Pulse Rate:  [60-152] 73 (12/23 1200) Resp:  [15-28] 16 (12/23 1200) BP: (105-140)/(47-98) 114/56 (12/23 1200) SpO2:  [92 %-100 %] 100 % (12/23 1200) Weight:  [61.2 kg-65.6 kg] 65.6 kg (12/23 0500)  Intake/Output from previous day: 12/22 0701 - 12/23 0700 In: 1977.4 [I.V.:1800.8] Out: 450 [Urine:300] Intake/Output this shift: Total I/O In: 293.5 [I.V.:195.4; IV Piggyback:98] Out: -   Eyes: PERRL, lids and conjunctivae normal ENMT: Mucous membranes are moist. Posterior pharynx clear of any exudate or lesions.Normal dentition.  Neck: normal, supple, no masses, no thyromegaly Respiratory: clear to auscultation bilaterally, no wheezing Cardiovascular: Regular rate and rhythm, No extremity edema. 2+ pedal pulses.  Abdomen:  Wound VAC on the surgical site below the umbilicus.  No discharge or drainage.  Nondistended, tender to palpation mildly.  No rebound tenderness. Musculoskeletal: no clubbing / cyanosis. No joint deformity upper and lower extremities. Good ROM, no contractures. Normal muscle tone.  Skin: no rashes, lesions, ulcers. No induration Neurologic: CN 2-12 grossly intact. Sensation  intact, DTR normal. Strength 5/5 in all 4.  Psychiatric: Normal judgment and insight. Alert and oriented x 3. Normal mood.   Results for orders placed or performed during the hospital encounter of 02/01/19 (from the past 24 hour(s))  Culture, blood (routine x 2)     Status: None (Preliminary result)   Collection Time: 02/01/19  4:35 PM   Specimen: BLOOD  Result Value Ref Range   Specimen Description      BLOOD RIGHT WRIST Performed at [facility] [address] [place], [place] [address]    Special Requests      BOTTLES DRAWN AEROBIC AND ANAEROBIC Blood Culture adequate volume Performed at [facility] [address], [place], [place] [address]    Culture      NO GROWTH < 24 HOURS Performed at [facility] [address], [place], [place] [address]    Report Status PENDING   Respiratory Panel by RT PCR (Flu A&B, Covid) - Nasopharyngeal Swab     Status: None   Collection Time: 02/01/19  4:38 PM   Specimen: Nasopharyngeal Swab  Result Value Ref Range   SARS Coronavirus 2 by RT PCR NEGATIVE NEGATIVE   Influenza A by PCR NEGATIVE NEGATIVE   Influenza B by PCR NEGATIVE NEGATIVE  Lactic acid, plasma     Status: Abnormal   Collection Time: 02/01/19  4:38 PM  Result Value Ref Range   Lactic Acid, Venous 2.9 (HH) 0.5 - 1.9 mmol/L  Protime-INR     Status: Abnormal   Collection Time: 02/01/19  4:38 PM  Result Value Ref Range   Prothrombin Time 18.1 (H) 11.4 - 15.2 seconds   INR 1.5 (H) 0.8 - 1.2  Culture, blood (routine x 2)     Status: None (Preliminary result)   Collection Time: 02/01/19  4:51 PM   Specimen: BLOOD  Result Value Ref Range   Specimen Description      BLOOD RIGHT ANTECUBITAL Performed at [facility], [place] [address], [place], [place] [address]    Special Requests      BOTTLES DRAWN AEROBIC AND ANAEROBIC Blood Culture adequate volume Performed at [facility] [address] [place], [place] [address]    Culture      NO GROWTH < 12 HOURS Performed at [facility] [address] [place], [place] [address]    Report Status PENDING   Type and screen [place]     Status: None   Collection Time: 02/01/19  5:46 PM  Result Value Ref Range   ABO/RH(D) O NEG    Antibody Screen NEG    Sample Expiration      02/04/2019,2359 Performed at [facility], [place] [address] [place], [place] [address]   ABO/Rh     Status: None   Collection Time: 02/01/19  5:46 PM  Result Value Ref Range   ABO/RH(D)      Jenetta Downer NEG Performed at [facility] [address] [place], [place] [address]   MRSA PCR Screening     Status: None   Collection Time: 02/01/19 10:49 PM   Specimen: Nasal Mucosa; Nasopharyngeal  Result Value Ref Range   MRSA by PCR NEGATIVE NEGATIVE  Hemoglobin A1c     Status: None   Collection Time: 02/02/19  1:57 AM  Result Value Ref Range   Hgb A1c MFr Bld 5.1 4.8 - 5.6 %   Mean Plasma Glucose 99.67 mg/dL  Comprehensive metabolic panel     Status: Abnormal   Collection Time: 02/02/19  1:57 AM  Result Value Ref Range   Sodium 138 135 - 145 mmol/L   Potassium 4.6 3.5 - 5.1 mmol/L   Chloride 108 98 - 111 mmol/L   CO2 20 (L) 22 - 32 mmol/L   Glucose, Bld 160 (H) 70 - 99 mg/dL   BUN 33 (H) 8 - 23 mg/dL   Creatinine, Ser 1.32 (H) 0.44 - 1.00 mg/dL   Calcium 8.6 (L) 8.9 - 10.3 mg/dL   Total Protein 5.6 (L) 6.5 - 8.1 g/dL   Albumin 3.0 (L) 3.5 - 5.0 g/dL   AST 20 15 - 41 U/L   ALT 11 0 - 44 U/L   Alkaline Phosphatase 98 38 - 126 U/L   Total Bilirubin 1.1 0.3 - 1.2 mg/dL   GFR calc non Af Amer 36 (L) >60 mL/min   GFR calc Af Amer 42 (L) >60 mL/min   Anion gap 10 5 - 15  CBC     Status: Abnormal   Collection Time: 02/02/19  1:57 AM  Result Value Ref Range   WBC 8.8 4.0 - 10.5 K/uL   RBC 3.76 (L) 3.87 - 5.11 MIL/uL   Hemoglobin 13.4 12.0 - 15.0 g/dL   HCT 41.4 36.0 - 46.0 %   MCV 110.1 (H)  80.0 - 100.0 fL   MCH 35.6 (H) 26.0 - 34.0 pg   MCHC 32.4 30.0 - 36.0 g/dL   RDW 15.0 11.5 - 15.5 %   Platelets 185 150 - 400  K/uL   nRBC 0.3 (H) 0.0 - 0.2 %  Magnesium     Status: None   Collection Time: 02/02/19  1:57 AM  Result Value Ref Range   Magnesium 2.1 1.7 - 2.4 mg/dL    Studies/Results: CT Abdomen Pelvis Wo Contrast  Result Date: 02/01/2019 CLINICAL DATA:  85 year old female with nausea and vomiting and abdominal pain. EXAM: CT ABDOMEN AND PELVIS WITHOUT CONTRAST TECHNIQUE: Multidetector CT imaging of the abdomen and pelvis was performed following the standard protocol without IV contrast. COMPARISON:  CT of the abdomen pelvis dated 05/21/2016. FINDINGS: Evaluation of this exam is limited in the absence of intravenous contrast. Lower chest: There is mild cardiomegaly. Partially visualized calcified mitral annulus. Cardiac pacemaker leads noted. The visualized lung bases are clear. No intra-abdominal free air. There is a small ascites. Hepatobiliary: The liver is grossly unremarkable. No intrahepatic biliary ductal dilatation. Minimal sludge may be present within the gallbladder. No calcified stone or pericholecystic fluid. Pancreas: The pancreas is grossly unremarkable. Spleen: Normal in size without focal abnormality. Adrenals/Urinary Tract: The adrenal glands are unremarkable. There is no hydronephrosis or nephrolithiasis on either side. Multiple bilateral renal hypodense lesions noted which are suboptimally characterized but the larger lesions demonstrate fluid attenuation and present on the prior CT most consistent with cysts. The visualized ureters appear unremarkable. The urinary bladder is minimally distended and grossly unremarkable. Stomach/Bowel: Multiple top-normal caliber and severely inflamed loops of small bowel in the lower abdomen and pelvis measure up to 2.8 cm in diameter. There is severe inflammatory changes of the mesentery associated with this bowel as well as  inflammatory changes of the bowel wall. Findings are concerning for a closed loop obstruction with probable point of obstruction in the left hemipelvis (series 2, image 66 and 67 and coronal series 5, image 63). There is an area of slight mesenteric twisting in the left hemiabdomen (series 5 images 34-53). And underlying omental defect with associated internal hernia is not excluded. Correlation with surgical history recommended. Several foci of air within the inflamed small bowel loops appear to be intraluminal. However, early ischemia is not excluded given the degree of inflammatory changes. Correlation with clinical exam and lactic acid levels recommended. There are scattered colonic diverticula without active inflammatory changes. The appendix is unremarkable as visualized. Vascular/Lymphatic: There is moderate aortoiliac atherosclerotic disease. The IVC is unremarkable. No portal venous gas. There is no adenopathy. Reproductive: The uterus is grossly unremarkable. No adnexal masses. Other: Small fat containing umbilical hernia. Musculoskeletal: Degenerative changes of the spine. No acute osseous pathology. IMPRESSION: 1. Findings concerning for closed loop obstruction in the lower abdomen/pelvis with possible early ischemia. No definite pneumatosis, portal venous gas, or free air at this time. Correlation with clinical exam, lactic acid levels, and surgical consult is advised. 2. Small ascites. 3. Colonic diverticulosis. 4. Aortic Atherosclerosis (ICD10-I70.0). These results were called by telephone at the time of interpretation on 02/01/2019 at 3:36 pm to physician assistant Alecia Lemming, who verbally acknowledged these results. Electronically Signed   By: Anner Crete M.D.   On: 02/01/2019 15:41   DG Chest Port 1 View  Result Date: 02/01/2019 CLINICAL DATA:  Nausea vomiting diarrhea since last night EXAM: PORTABLE CHEST 1 VIEW COMPARISON:  02/25/2016 FINDINGS: Cardiomediastinal contours are enlarged.  Dual lead pacer device in place. Pack over left chest. Lungs are clear. Postoperative changes over the right breast as before. Visualized skeletal structures without acute finding. IMPRESSION: 1. No active cardiopulmonary disease. 2. Stable enlargement of the cardiomediastinal  contours. Electronically Signed   By: Zetta Bills M.D.   On: 02/01/2019 18:30    Scheduled Meds: . bisacodyl  10 mg Rectal Daily  . Chlorhexidine Gluconate Cloth  6 each Topical Daily  . enoxaparin (LOVENOX) injection  30 mg Subcutaneous Daily  . lip balm  1 application Topical BID  . mouth rinse  15 mL Mouth Rinse BID  . metoprolol tartrate  5 mg Intravenous Q6H  . neomycin-polymyxin b-dexamethasone  1 application Right Eye QHS  . polyvinyl alcohol   Right Eye BID  . sodium chloride flush  3 mL Intravenous Once   Continuous Infusions: . sodium chloride    . acetaminophen Stopped (02/02/19 1012)  . famotidine (PEPCID) IV    . lactated ringers 50 mL/hr at 02/02/19 0856  . meropenem (MERREM) IV Stopped (02/02/19 0531)  . methocarbamol (ROBAXIN) IV    . ondansetron (ZOFRAN) IV     PRN Meds:sodium chloride, alum & mag hydroxide-simeth, clobetasol cream, diphenhydrAMINE, enalaprilat, HYDROmorphone (DILAUDID) injection, labetalol, magic mouthwash, menthol-cetylpyridinium, methocarbamol (ROBAXIN) IV, morphine injection, naphazoline-glycerin, ondansetron (ZOFRAN) IV **OR** ondansetron (ZOFRAN) IV, phenol, prochlorperazine, simethicone  Assessment/Plan: 1. Bowel obstruction:  POD 1, s/p dx lap with LOA and SBR for closed loop bowel obstruction with early perforation and ischemia with primary umbilical hernia repair 1. Will continue NPO status; continue NG tube as per surgery 2. Cont analgesics and supportive care 3. Repeat labs 4. WOC consult for VAC change on Friday, or by bedside nurse if [place] not here; mobilize 5. I-S 6. PT OT consulted 7. Patient had surgery input  2. HTN 1. Noted to be hypotensive, improved  with IVF hydration 2. BP currently stable 3. Continue IVF hydration as tolerated  3. Hx afib and sick sinus syndrome 1. Followed by Dr. Rayann Heman 2. Currently rate controlled 3. Pt had been on xarelto prior to admit, hold prior to surgery on 02/01/2019.  Consider resuming anticoagulation when OK with general surgery 4. On cardizem, flecainide prior to admit; may resume when allowed to have p.o. diet 5. As pt is NPO, will continue on scheduled IV lopressor for rate control with labetalol IV PRN for HR>120  4. Sepsis: Resolved 1. Presented with leukocytosis with tachypnea 2. Likely secondary to presenting obstruction 3. Negative blood cx and UA as well as CXR so far 4. Will cont current broad spec abx meropenem as started in ED given pt's numerous allergies 5. Repeat CBC in AM  5. ARF: Slowly improving 1. Likely secondary to dehydration 2. Will continue with IVF as tolerated 3. Repeat bmet in AM  DVT prophylaxis:  Lovenox Code Status: Full Disposition Plan:  Once a stable Consults called: General Surgery   LOS: 1 day   Bryton Romagnoli Izetta Dakin

## 2019-02-02 NOTE — Evaluation (Signed)
Physical Therapy Evaluation Patient Details Name: Megan Hester MRN: [id number] DOB: October 28, 1930 Today's Date: 02/02/2019   History of Present Illness  85 y.o. female with medical history significant of sick sinus syndrome, afib on chronic xarelto, HTN, GERD who presented to the ED with 1 day of abd discomfort and nausea/vomiting.  Pt s/p dx lap with LOA and SBR for closed loop bowel obstruction with early perforation and ischemia with primary umbilical hernia repair by Dr. Johney Maine on 02/01/19  Clinical Impression  Pt admitted with above diagnosis.  Pt currently with functional limitations due to the deficits listed below (see PT Problem List). Pt will benefit from skilled PT to increase their independence and safety with mobility to allow discharge to the venue listed below.  Pt assisted with standing a couple times and marching in place.  Pt motivated and anticipate good progress through recovery.  Pt with multiples lines today and would need +2 for safely ambulating.  Requested recliner chair for pt's room as well.  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     Follow Up Recommendations Home health PT;Supervision/Assistance - 24 hour    Equipment Recommendations  Rolling walker with 5" wheels    Recommendations for Other Services       Precautions / Restrictions Precautions Precautions: Fall Precaution Comments: NG tube, NPWT abdominal incision      Mobility  Bed Mobility Overal bed mobility: Needs Assistance Bed Mobility: Rolling;Sidelying to Sit;Sit to Sidelying Rolling: Min assist Sidelying to sit: Mod assist     Sit to sidelying: Mod assist General bed mobility comments: verbal and tactile cues for rolling, pt assisting however reporting abdominal pain, assist for trunk upright and LEs onto bed  Transfers Overall transfer level: Needs assistance Equipment used: Rolling walker (2 wheeled) Transfers: Sit  to/from Stand Sit to Stand: Min assist         General transfer comment: verbal cues for hand placement, light assist to rise, also assist for multiple lines, performed twice and pt also able to march in place about seconds prior to needing rest  Ambulation/Gait                Stairs            Wheelchair Mobility    Modified Rankin (Stroke Patients Only)       Balance Overall balance assessment: Needs assistance         Standing balance support: Bilateral upper extremity supported Standing balance-Leahy Scale: Poor                               Pertinent Vitals/Pain Pain Assessment: Faces Faces Pain Scale: Hurts even more Pain Location: incision/abdomen Pain Descriptors / Indicators: Grimacing;Guarding Pain Intervention(s): Monitored during session;Repositioned    Home Living Family/patient expects to be discharged to:: Private residence Living Arrangements: Spouse/significant other;Children Available Help at Discharge: Family Type of Home: House       Home Layout: Two level Home Equipment: None Additional Comments: 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, 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 with husband    Prior Function Level of Independence: Independent               Hand Dominance        Extremity/Trunk Assessment        Lower Extremity Assessment Lower Extremity Assessment: Generalized weakness    Cervical / Trunk Assessment  Cervical / Trunk Assessment: Normal  Communication   Communication: No difficulties  Cognition Arousal/Alertness: Awake/alert Behavior During Therapy: WFL for tasks assessed/performed Overall Cognitive Status: No family/caregiver present to determine baseline cognitive functioning                                 General Comments: RN reports pt confused today, pt orientated however unable to recall surgery, states she thought she was dreaming, appears appropriate and follows commands       General Comments      Exercises     Assessment/Plan    PT Assessment Patient needs continued PT services  PT Problem List Decreased strength;Decreased mobility;Decreased activity tolerance;Decreased balance;Decreased knowledge of use of DME;Pain       PT Treatment Interventions DME instruction;Therapeutic activities;Gait training;Therapeutic exercise;Patient/family education;Functional mobility training;Balance training    PT Goals (Current goals can be found in the Care Plan section)  Acute Rehab PT Goals PT Goal Formulation: With patient Time For Goal Achievement: 02/16/19 Potential to Achieve Goals: Good    Frequency Min 3X/week   Barriers to discharge        Co-evaluation               AM-PAC PT "6 Clicks" Mobility  Outcome Measure Help needed turning from your back to your side while in a flat bed without using bedrails?: A Lot Help needed moving from lying on your back to sitting on the side of a flat bed without using bedrails?: A Lot Help needed moving to and from a bed to a chair (including a wheelchair)?: A Lot Help needed standing up from a chair using your arms (e.g., wheelchair or bedside chair)?: A Lot Help needed to walk in hospital room?: A Lot Help needed climbing 3-5 steps with a railing? : Total 6 Click Score: 11    End of Session Equipment Utilized During Treatment: Oxygen Activity Tolerance: Patient tolerated treatment well Patient left: in bed;with call bell/phone within reach;with bed alarm set Nurse Communication: Mobility status(requested recliner chair for pt's room) PT Visit Diagnosis: Difficulty in walking, not elsewhere classified (R26.2)    Time: [id number] PT Time Calculation (min) (ACUTE ONLY): 18 min   Charges:   PT Evaluation $PT Eval Moderate Complexity: 1 Mod     Kati PT, DPT Acute Rehabilitation Services Office: [phone]  Trena Platt 02/02/2019, 4:10 PM

## 2019-02-02 NOTE — Consult Note (Signed)
WOC Nurse Consult Note: Reason for Consult: NPWT midline Wound type: surgical  Pressure Injury POA: NA Measurement: see nursing note Wound bed: intact VAC dressing with good seal @125mmHG  Drainage (amount, consistency, odor) scant in canister Periwound:not assessed Dressing procedure/placement/frequency: Uncomplicated NPWT dressing to the midline; ok for bedside nursing to change T/Th/Sat. Orders updated to reflect that.  2 dressing kits ordered and placed in the patient's room for next scheduled changes.   Discussed POC with patient and bedside nurse.  Re consult if needed, will not follow at this time. Thanks  Ruthmary Occhipinti [employer], RN,CWOCN, CNS, [place] [phone])

## 2019-02-02 NOTE — Progress Notes (Addendum)
Patient ID: Megan Hester, female   DOB: 04-03-1930, 85 y.o.   MRN: [id number]    1 Day Post-Op  Subjective: Patient complains of throat pain this am as well as some pain in her abdomen.  Explained to her what we found.  No flatus.  ROS: See above, otherwise other systems negative  Objective: Vital signs in last 24 hours: Temp:  [97.3 F (36.3 C)-98.5 F (36.9 C)] 98 F (36.7 C) (12/23 0803) Pulse Rate:  [60-152] 60 (12/23 0600) Resp:  [15-28] 18 (12/23 0600) BP: (84-140)/(47-98) 105/47 (12/23 0600) SpO2:  [92 %-100 %] 100 % (12/23 0600) Weight:  [61.2 kg-65.6 kg] 65.6 kg (12/23 0500) Last BM Date: (UTA due to drowsiness)  Intake/Output from previous day: 12/22 0701 - 12/23 0700 In: 1977.4 [I.V.:1800.8; NG/GT:20; IV Piggyback:156.6] Out: 450 [Urine:300; Emesis/NG output:50; Blood:100] Intake/Output this shift: No intake/output data recorded.  PE: Gen: NAD Heart: regular, paced Lungs: CTAB Abd: soft, appropriately tender, wound VAC in place, absent BS, NGT with about 100cc of bilious output  Lab Results:  Recent Labs    02/01/19 1056 02/02/19 0157  WBC 24.6* 8.8  HGB 14.0 13.4  HCT 42.7 41.4  PLT 358 185   BMET Recent Labs    02/01/19 1056 02/02/19 0157  NA 138 138  K 4.4 4.6  CL 99 108  CO2 22 20*  GLUCOSE 211* 160*  BUN 27* 33*  CREATININE 1.40* 1.32*  CALCIUM 10.1 8.6*   PT/INR Recent Labs    02/01/19 1638  LABPROT 18.1*  INR 1.5*   CMP     Component Value Date/Time   NA 138 02/02/2019 0157   NA 141 11/01/2018 1312   K 4.6 02/02/2019 0157   CL 108 02/02/2019 0157   CO2 20 (L) 02/02/2019 0157   GLUCOSE 160 (H) 02/02/2019 0157   BUN 33 (H) 02/02/2019 0157   BUN 12 11/01/2018 1312   CREATININE 1.32 (H) 02/02/2019 0157   CALCIUM 8.6 (L) 02/02/2019 0157   PROT 5.6 (L) 02/02/2019 0157   ALBUMIN 3.0 (L) 02/02/2019 0157   AST 20 02/02/2019 0157   ALT 11 02/02/2019 0157   ALKPHOS 98 02/02/2019 0157   BILITOT 1.1 02/02/2019 0157    GFRNONAA 36 (L) 02/02/2019 0157   GFRAA 42 (L) 02/02/2019 0157   Lipase     Component Value Date/Time   LIPASE 26 02/01/2019 1056       Studies/Results: CT Abdomen Pelvis Wo Contrast  Result Date: 02/01/2019 CLINICAL DATA:  85 year old female with nausea and vomiting and abdominal pain. EXAM: CT ABDOMEN AND PELVIS WITHOUT CONTRAST TECHNIQUE: Multidetector CT imaging of the abdomen and pelvis was performed following the standard protocol without IV contrast. COMPARISON:  CT of the abdomen pelvis dated 05/21/2016. FINDINGS: Evaluation of this exam is limited in the absence of intravenous contrast. Lower chest: There is mild cardiomegaly. Partially visualized calcified mitral annulus. Cardiac pacemaker leads noted. The visualized lung bases are clear. No intra-abdominal free air. There is a small ascites. Hepatobiliary: The liver is grossly unremarkable. No intrahepatic biliary ductal dilatation. Minimal sludge may be present within the gallbladder. No calcified stone or pericholecystic fluid. Pancreas: The pancreas is grossly unremarkable. Spleen: Normal in size without focal abnormality. Adrenals/Urinary Tract: The adrenal glands are unremarkable. There is no hydronephrosis or nephrolithiasis on either side. Multiple bilateral renal hypodense lesions noted which are suboptimally characterized but the larger lesions demonstrate fluid attenuation and present on the prior CT most consistent with cysts. The visualized ureters  appear unremarkable. The urinary bladder is minimally distended and grossly unremarkable. Stomach/Bowel: Multiple top-normal caliber and severely inflamed loops of small bowel in the lower abdomen and pelvis measure up to 2.8 cm in diameter. There is severe inflammatory changes of the mesentery associated with this bowel as well as inflammatory changes of the bowel wall. Findings are concerning for a closed loop obstruction with probable point of obstruction in the left hemipelvis  (series 2, image 66 and 67 and coronal series 5, image 63). There is an area of slight mesenteric twisting in the left hemiabdomen (series 5 images 34-53). And underlying omental defect with associated internal hernia is not excluded. Correlation with surgical history recommended. Several foci of air within the inflamed small bowel loops appear to be intraluminal. However, early ischemia is not excluded given the degree of inflammatory changes. Correlation with clinical exam and lactic acid levels recommended. There are scattered colonic diverticula without active inflammatory changes. The appendix is unremarkable as visualized. Vascular/Lymphatic: There is moderate aortoiliac atherosclerotic disease. The IVC is unremarkable. No portal venous gas. There is no adenopathy. Reproductive: The uterus is grossly unremarkable. No adnexal masses. Other: Small fat containing umbilical hernia. Musculoskeletal: Degenerative changes of the spine. No acute osseous pathology. IMPRESSION: 1. Findings concerning for closed loop obstruction in the lower abdomen/pelvis with possible early ischemia. No definite pneumatosis, portal venous gas, or free air at this time. Correlation with clinical exam, lactic acid levels, and surgical consult is advised. 2. Small ascites. 3. Colonic diverticulosis. 4. Aortic Atherosclerosis (ICD10-I70.0). These results were called by telephone at the time of interpretation on 02/01/2019 at 3:36 pm to physician assistant Alecia Lemming, who verbally acknowledged these results. Electronically Signed   By: Anner Crete M.D.   On: 02/01/2019 15:41   DG Chest Port 1 View  Result Date: 02/01/2019 CLINICAL DATA:  Nausea vomiting diarrhea since last night EXAM: PORTABLE CHEST 1 VIEW COMPARISON:  02/25/2016 FINDINGS: Cardiomediastinal contours are enlarged. Dual lead pacer device in place. Pack over left chest. Lungs are clear. Postoperative changes over the right breast as before. Visualized skeletal  structures without acute finding. IMPRESSION: 1. No active cardiopulmonary disease. 2. Stable enlargement of the cardiomediastinal contours. Electronically Signed   By: Zetta Bills M.D.   On: 02/01/2019 18:30    Anti-infectives: Anti-infectives (From admission, onward)   Start     Dose/Rate Route Frequency Ordered Stop   02/02/19 0600  meropenem (MERREM) 1 g in sodium chloride 0.9 % 100 mL IVPB     1 g 200 mL/hr over 30 Minutes Intravenous Every 12 hours 02/01/19 2243 02/04/19 1759   02/01/19 1730  clindamycin (CLEOCIN) IVPB 900 mg     900 mg 100 mL/hr over 30 Minutes Intravenous On call to O.R. 02/01/19 1654 02/01/19 2009   02/01/19 1730  gentamicin (GARAMYCIN) 310 mg in dextrose 5 % 100 mL IVPB     5 mg/kg  61.2 kg 107.8 mL/hr over 60 Minutes Intravenous On call to O.R. 02/01/19 1654 02/01/19 1957   02/01/19 1545  meropenem (MERREM) 1 g in sodium chloride 0.9 % 100 mL IVPB     1 g 200 mL/hr over 30 Minutes Intravenous STAT 02/01/19 1533 02/01/19 1724       Assessment/Plan Sick sinus syndrome Atrial fibrillation -on Xarelto/flecainide/Cardizem Hypertension Hx right IN nuclear lesion  POD 1, s/p dx lap with LOA and SBR for closed loop bowel obstruction with early perforation and ischemia with primary umbilical hernia repair, Dr. Johney Maine -cont NGT and await  ileus resolution -WOC consult for VAC change on Friday, or by bedside nurse if [place] not here -mobilize, will need PT -IS -cepacol and spray for her throat  -multimodal pain control  FEN - NPO/NGT/IVFs VTE - Lovenox ID - Merrem Foley - in place, orders for DC on POD 2, but certainly follow UOP today   LOS: 1 day    Henreitta Cea , [facility] Surgery 02/02/2019, 8:15 AM Please see Amion for pager number during day hours 7:00am-4:30pm

## 2019-02-03 DIAGNOSIS — I4891 Unspecified atrial fibrillation: Secondary | ICD-10-CM

## 2019-02-03 LAB — COMPREHENSIVE METABOLIC PANEL
ALT: 13 U/L (ref 0–44)
AST: 23 U/L (ref 15–41)
Albumin: 2.8 g/dL — ABNORMAL LOW (ref 3.5–5.0)
Alkaline Phosphatase: 96 U/L (ref 38–126)
Anion gap: 9 (ref 5–15)
BUN: 35 mg/dL — ABNORMAL HIGH (ref 8–23)
CO2: 25 mmol/L (ref 22–32)
Calcium: 9 mg/dL (ref 8.9–10.3)
Chloride: 106 mmol/L (ref 98–111)
Creatinine, Ser: 0.86 mg/dL (ref 0.44–1.00)
GFR calc Af Amer: >60 mL/min (ref >60)
GFR calc non Af Amer: >60 mL/min (ref >60)
Glucose, Bld: 95 mg/dL (ref 70–99)
Potassium: 3.9 mmol/L (ref 3.5–5.1)
Sodium: 140 mmol/L (ref 135–145)
Total Bilirubin: 1 mg/dL (ref 0.3–1.2)
Total Protein: 5.6 g/dL — ABNORMAL LOW (ref 6.5–8.1)

## 2019-02-03 LAB — URINALYSIS, ROUTINE W REFLEX MICROSCOPIC
Bacteria, UA: NONE SEEN
Bilirubin Urine: NEGATIVE
Glucose, UA: NEGATIVE mg/dL
Ketones, ur: NEGATIVE mg/dL
Leukocytes,Ua: NEGATIVE
Nitrite: NEGATIVE
Protein, ur: 30 mg/dL — AB
Specific Gravity, Urine: 1.028 (ref 1.005–1.030)
pH: 6 (ref 5.0–8.0)

## 2019-02-03 LAB — CBC WITH DIFFERENTIAL/PLATELET
Abs Immature Granulocytes: 0.05 10*3/uL (ref 0.00–0.07)
Basophils Absolute: 0 10*3/uL (ref 0.0–0.1)
Basophils Relative: 0 %
Eosinophils Absolute: 0.1 10*3/uL (ref 0.0–0.5)
Eosinophils Relative: 1 %
HCT: 31.4 % — ABNORMAL LOW (ref 36.0–46.0)
Hemoglobin: 10.1 g/dL — ABNORMAL LOW (ref 12.0–15.0)
Immature Granulocytes: 1 %
Lymphocytes Relative: 11 %
Lymphs Abs: 1.1 10*3/uL (ref 0.7–4.0)
MCH: 36.7 pg — ABNORMAL HIGH (ref 26.0–34.0)
MCHC: 32.2 g/dL (ref 30.0–36.0)
MCV: 114.2 fL — ABNORMAL HIGH (ref 80.0–100.0)
Monocytes Absolute: 0.4 10*3/uL (ref 0.1–1.0)
Monocytes Relative: 4 %
Neutro Abs: 8.4 10*3/uL — ABNORMAL HIGH (ref 1.7–7.7)
Neutrophils Relative %: 83 %
Platelets: 163 10*3/uL (ref 150–400)
RBC: 2.75 MIL/uL — ABNORMAL LOW (ref 3.87–5.11)
RDW: 15.3 % (ref 11.5–15.5)
WBC: 10 10*3/uL (ref 4.0–10.5)
nRBC: 0 % (ref 0.0–0.2)

## 2019-02-03 LAB — SURGICAL PATHOLOGY

## 2019-02-03 MED ORDER — HALOPERIDOL LACTATE 5 MG/ML IJ SOLN
0.5000 mg | Freq: Three times a day (TID) | INTRAMUSCULAR | Status: DC | PRN
  Administered 2019-02-03: 0.5 mg via INTRAMUSCULAR
  Filled 2019-02-03: qty 1

## 2019-02-03 MED ORDER — OLANZAPINE 10 MG IM SOLR
2.5000 mg | Freq: Three times a day (TID) | INTRAMUSCULAR | Status: DC | PRN
  Administered 2019-02-03: 2.5 mg via INTRAMUSCULAR
  Filled 2019-02-03 (×4): qty 10

## 2019-02-03 MED ORDER — HALOPERIDOL LACTATE 5 MG/ML IJ SOLN
1.0000 mg | Freq: Three times a day (TID) | INTRAMUSCULAR | Status: DC | PRN
  Administered 2019-02-08: 1 mg via INTRAMUSCULAR
  Filled 2019-02-03: qty 1

## 2019-02-03 MED ORDER — AMIODARONE HCL IN DEXTROSE 360-4.14 MG/200ML-% IV SOLN
30.0000 mg/h | INTRAVENOUS | Status: DC
  Administered 2019-02-03 – 2019-02-05 (×5): 30 mg/h via INTRAVENOUS
  Filled 2019-02-03 (×6): qty 200

## 2019-02-03 MED ORDER — SODIUM CHLORIDE 0.9 % IV BOLUS
250.0000 mL | Freq: Once | INTRAVENOUS | Status: AC
  Administered 2019-02-03: 18:00:00 250 mL via INTRAVENOUS

## 2019-02-03 MED ORDER — LACTATED RINGERS IV SOLN: INTRAVENOUS | Status: AC

## 2019-02-03 MED ORDER — AMIODARONE HCL IN DEXTROSE 360-4.14 MG/200ML-% IV SOLN
60.0000 mg/h | INTRAVENOUS | Status: AC
  Administered 2019-02-03: 60 mg/h via INTRAVENOUS
  Filled 2019-02-03: qty 200

## 2019-02-03 MED ORDER — ENOXAPARIN SODIUM 40 MG/0.4ML ~~LOC~~ SOLN: 40.0000 mg | Freq: Every day | SUBCUTANEOUS | Status: DC

## 2019-02-03 MED ORDER — FAMOTIDINE IN NACL 20-0.9 MG/50ML-% IV SOLN
20.0000 mg | Freq: Two times a day (BID) | INTRAVENOUS | Status: AC
  Administered 2019-02-03 – 2019-02-12 (×18): 20 mg via INTRAVENOUS
  Filled 2019-02-03 (×18): qty 50

## 2019-02-03 MED ORDER — STERILE WATER FOR INJECTION IJ SOLN
INTRAMUSCULAR | Status: AC
  Filled 2019-02-03: qty 10

## 2019-02-03 MED ORDER — SODIUM CHLORIDE 0.9 % IV BOLUS
250.0000 mL | Freq: Once | INTRAVENOUS | Status: AC
  Administered 2019-02-03: 250 mL via INTRAVENOUS

## 2019-02-03 NOTE — Progress Notes (Addendum)
[facility] Office:  [phone] General Surgery Progress Note   LOS: 2 days  POD -  2 Days Post-Op  Chief Complaint: Abdominal pain  Assessment and Plan: 1. LAPAROSCOPY DIAGNOSTIC, LYSIS OF ADHESION, LAPAROSCOPIC SMALL BOWEL RESECTION, APPLICATION OF WOUND VAC - 02/01/2019 - Gross  On Merrem  Wound with VAC - to change MWF  Continue NGT until bowel function, needs to ambulate, check labs in AM  2.  Sick sinus syndrome 3.  Atrial fibrillation-  on Xarelto/flecainide/Cardizem - Xarelto on hold for now 4.  Hypertension 5.  Hx right IN nuclear lesion 6.  Creatinine - 1.32 - 04/05/2018 7.  LVT prophylaxis - Lovenox 8.  Remove foley   Principal Problem:   Closed loop SBO (small bowel obstruction)  Active Problems:   A-fib (HCC)   Hypertension   Sick sinus syndrome (HCC)   Sepsis (HCC)   Chronic anticoagulation   Cardiac pacemaker in place - [employer]onic [id number] Advisa DR MRI   Rectocele   DM (diabetes mellitus) ([place])   History of adenomatous polyp of colon   Bowel obstruction (HCC)   Acute focal ischemia of small intestine (HCC)   Subjective:  Alert.  Fairly well oriented.  Pain controlled.  Her contact is her son, Teretha Ilg.  I spoke to him on the phone.  She lives with Richardson Landry.  Objective:   Vitals:   02/03/19 0600 02/03/19 0700  BP: 106/65 (!) 94/47  Pulse: 94 63  Resp: 18 17  Temp:    SpO2: 95% 93%     Intake/Output from previous day:  12/23 0701 - 12/24 0700 In: 1309.4 [I.V.:678.4; IV Piggyback:630.9] Out: 952 [Urine:652; Emesis/NG output:300]  Intake/Output this shift:  No intake/output data recorded.   Physical Exam:   General: WN older WF who is alert and oriented.   She has an NGT   HEENT: Normal. Pupils equal. .   Lungs: Clear.   Abdomen: Soft.   Wound: Has VAC on abdomen   Lab Results:    Recent Labs    02/01/19 1056 02/02/19 0157  WBC 24.6* 8.8  HGB 14.0 13.4  HCT 42.7 41.4  PLT 358 185    BMET   Recent Labs     02/01/19 1056 02/02/19 0157  NA 138 138  K 4.4 4.6  CL 99 108  CO2 22 20*  GLUCOSE 211* 160*  BUN 27* 33*  CREATININE 1.40* 1.32*  CALCIUM 10.1 8.6*    PT/INR   Recent Labs    02/01/19 1638  LABPROT 18.1*  INR 1.5*    ABG  No results for input(s): PHART, HCO3 in the last 72 hours.  Invalid input(s): PCO2, PO2   Studies/Results:  CT Abdomen Pelvis Wo Contrast  Result Date: 02/01/2019 CLINICAL DATA:  85 year old female with nausea and vomiting and abdominal pain. EXAM: CT ABDOMEN AND PELVIS WITHOUT CONTRAST TECHNIQUE: Multidetector CT imaging of the abdomen and pelvis was performed following the standard protocol without IV contrast. COMPARISON:  CT of the abdomen pelvis dated 05/21/2016. FINDINGS: Evaluation of this exam is limited in the absence of intravenous contrast. Lower chest: There is mild cardiomegaly. Partially visualized calcified mitral annulus. Cardiac pacemaker leads noted. The visualized lung bases are clear. No intra-abdominal free air. There is a small ascites. Hepatobiliary: The liver is grossly unremarkable. No intrahepatic biliary ductal dilatation. Minimal sludge may be present within the gallbladder. No calcified stone or pericholecystic fluid. Pancreas: The pancreas is grossly unremarkable. Spleen: Normal in size without focal abnormality. Adrenals/Urinary  Tract: The adrenal glands are unremarkable. There is no hydronephrosis or nephrolithiasis on either side. Multiple bilateral renal hypodense lesions noted which are suboptimally characterized but the larger lesions demonstrate fluid attenuation and present on the prior CT most consistent with cysts. The visualized ureters appear unremarkable. The urinary bladder is minimally distended and grossly unremarkable. Stomach/Bowel: Multiple top-normal caliber and severely inflamed loops of small bowel in the lower abdomen and pelvis measure up to 2.8 cm in diameter. There is severe inflammatory changes of the mesentery  associated with this bowel as well as inflammatory changes of the bowel wall. Findings are concerning for a closed loop obstruction with probable point of obstruction in the left hemipelvis (series 2, image 66 and 67 and coronal series 5, image 63). There is an area of slight mesenteric twisting in the left hemiabdomen (series 5 images 34-53). And underlying omental defect with associated internal hernia is not excluded. Correlation with surgical history recommended. Several foci of air within the inflamed small bowel loops appear to be intraluminal. However, early ischemia is not excluded given the degree of inflammatory changes. Correlation with clinical exam and lactic acid levels recommended. There are scattered colonic diverticula without active inflammatory changes. The appendix is unremarkable as visualized. Vascular/Lymphatic: There is moderate aortoiliac atherosclerotic disease. The IVC is unremarkable. No portal venous gas. There is no adenopathy. Reproductive: The uterus is grossly unremarkable. No adnexal masses. Other: Small fat containing umbilical hernia. Musculoskeletal: Degenerative changes of the spine. No acute osseous pathology. IMPRESSION: 1. Findings concerning for closed loop obstruction in the lower abdomen/pelvis with possible early ischemia. No definite pneumatosis, portal venous gas, or free air at this time. Correlation with clinical exam, lactic acid levels, and surgical consult is advised. 2. Small ascites. 3. Colonic diverticulosis. 4. Aortic Atherosclerosis (ICD10-I70.0). These results were called by telephone at the time of interpretation on 02/01/2019 at 3:36 pm to physician assistant Alecia Lemming, who verbally acknowledged these results. Electronically Signed   By: Anner Crete M.D.   On: 02/01/2019 15:41   DG Chest Port 1 View  Result Date: 02/01/2019 CLINICAL DATA:  Nausea vomiting diarrhea since last night EXAM: PORTABLE CHEST 1 VIEW COMPARISON:  02/25/2016 FINDINGS:  Cardiomediastinal contours are enlarged. Dual lead pacer device in place. Pack over left chest. Lungs are clear. Postoperative changes over the right breast as before. Visualized skeletal structures without acute finding. IMPRESSION: 1. No active cardiopulmonary disease. 2. Stable enlargement of the cardiomediastinal contours. Electronically Signed   By: Zetta Bills M.D.   On: 02/01/2019 18:30     Anti-infectives:   Anti-infectives (From admission, onward)   Start     Dose/Rate Route Frequency Ordered Stop   02/02/19 0600  meropenem (MERREM) 1 g in sodium chloride 0.9 % 100 mL IVPB     1 g 200 mL/hr over 30 Minutes Intravenous Every 12 hours 02/01/19 2243 02/04/19 1759   02/01/19 1730  clindamycin (CLEOCIN) IVPB 900 mg     900 mg 100 mL/hr over 30 Minutes Intravenous On call to O.R. 02/01/19 1654 02/01/19 2009   02/01/19 1730  gentamicin (GARAMYCIN) 310 mg in dextrose 5 % 100 mL IVPB     5 mg/kg  61.2 kg 107.8 mL/hr over 60 Minutes Intravenous On call to O.R. 02/01/19 1654 02/01/19 1957   02/01/19 1545  meropenem (MERREM) 1 g in sodium chloride 0.9 % 100 mL IVPB     1 g 200 mL/hr over 30 Minutes Intravenous STAT 02/01/19 1533 02/01/19 1724  Alphonsa Overall, MD, [facility] Surgery Office: [phone] 02/03/2019

## 2019-02-03 NOTE — Progress Notes (Signed)
  Amiodarone Drug - Drug Interaction Consult Note  Recommendations: Monitor QTc closely if haldol and zyprexa prm are used  Amiodarone is metabolized by the cytochrome P450 system and therefore has the potential to cause many drug interactions. Amiodarone has an average plasma half-life of 50 days (range 20 to 100 days).   There is potential for drug interactions to occur several weeks or months after stopping treatment and the onset of drug interactions may be slow after initiating amiodarone.   []  Statins: Increased risk of myopathy. Simvastatin- restrict dose to 20mg  daily. Other statins: counsel patients to report any muscle pain or weakness immediately.  []  Anticoagulants: Amiodarone can increase anticoagulant effect. Consider warfarin dose reduction. Patients should be monitored closely and the dose of anticoagulant altered accordingly, remembering that amiodarone levels take several weeks to stabilize.  []  Antiepileptics: Amiodarone can increase plasma concentration of phenytoin, the dose should be reduced. Note that small changes in phenytoin dose can result in large changes in levels. Monitor patient and counsel on signs of toxicity.  []  Beta blockers: increased risk of bradycardia, AV block and myocardial depression. Sotalol - avoid concomitant use.  []   Calcium channel blockers (diltiazem and verapamil): increased risk of bradycardia, AV block and myocardial depression.  []   Cyclosporine: Amiodarone increases levels of cyclosporine. Reduced dose of cyclosporine is recommended.  []  Digoxin dose should be halved when amiodarone is started.  []  Diuretics: increased risk of cardiotoxicity if hypokalemia occurs.  []  Oral hypoglycemic agents (glyburide, glipizide, glimepiride): increased risk of hypoglycemia. Patient's glucose levels should be monitored closely when initiating amiodarone therapy.   [x]  Drugs that prolong the QT interval:  Torsades de pointes risk may be increased  with concurrent use - avoid if possible.  Monitor QTc, also keep magnesium/potassium WNL if concurrent therapy can't be avoided. [name] Kitchen Antibiotics: e.g. fluoroquinolones, erythromycin. . Antiarrhythmics: e.g. quinidine, procainamide, disopyramide, sotalol. . Antipsychotics: e.g. phenothiazines, haloperidol.zyprexa   . Lithium, tricyclic antidepressants, and methadone. Thank You,  Lynelle Doctor  02/03/2019 4:16 PM

## 2019-02-03 NOTE — Progress Notes (Addendum)
Brief Hx:  Megan Hester is a 85 y.o. female with medical history significant of sick sinus syndrome, afib on chronic xarelto, HTN, GERD who presented to the ED with 1 day of abd discomfort and nausea/vomiting.  ED, pt noted to have presenting bp of 84/63, improved to sbp in the 120's with IVF hydration. Cr noted to be 1.4, WBC 24.6 with hgb of 14. Abd CT notable for closed loop obstruction in the lower abd with possible early ischemia.  S/P Lap Surgery for SBP.   Subjective: Was much alert this morning.  Also agitated and was trying to pull out lines and wound VAC per nursing staffs.  And was needed to be restrained at one point.  NG tube to wall suction still putting out bilious output.  Objective: Vital signs in last 24 hours: Temp:  [97.5 F (36.4 C)-98.6 F (37 C)] 98 F (36.7 C) (12/24 1201) Pulse Rate:  [62-125] 113 (12/24 1300) Resp:  [14-24] 24 (12/24 1300) BP: (76-152)/(42-87) 92/61 (12/24 1300) SpO2:  [93 %-100 %] 100 % (12/24 1300) Weight:  [64.2 kg] 64.2 kg (12/24 0500)  Intake/Output from previous day: 12/23 0701 - 12/24 0700 In: 1309.4 [I.V.:678.4] Out: 952 [Urine:652] Intake/Output this shift: Total I/O In: 120 [NG/GT:120] Out: 300 [Urine:200; Emesis/NG output:100]  Eyes: PERRL, lids and conjunctivae normal ENMT: Mucous membranes are moist. Posterior pharynx clear of any exudate or lesions.Normal dentition.  Neck: normal, supple, no masses, no thyromegaly Respiratory: clear to auscultation bilaterally, no wheezing Cardiovascular: Regular rate and rhythm, No extremity edema. 2+ pedal pulses.  Abdomen:  Wound VAC on the surgical site below the umbilicus.  No discharge or drainage.  Nondistended, tender to palpation mildly.  No rebound tenderness. Musculoskeletal: no clubbing / cyanosis. No joint deformity upper and lower extremities. Good ROM, no contractures. Normal muscle tone.  Skin: no rashes, lesions, ulcers. No induration Neurologic: CN 2-12 grossly  intact. Sensation intact, DTR normal. Strength 5/5 in all 4.  Psychiatric:  Alert.  Agitated  Results for orders placed or performed during the hospital encounter of 02/01/19 (from the past 24 hour(s))  Urinalysis, Routine w reflex microscopic     Status: Abnormal   Collection Time: 02/03/19  6:37 AM  Result Value Ref Range   Color, Urine YELLOW YELLOW   APPearance CLEAR CLEAR   Specific Gravity, Urine 1.028 1.005 - 1.030   pH 6.0 5.0 - 8.0   Glucose, UA NEGATIVE NEGATIVE mg/dL   Hgb urine dipstick SMALL (A) NEGATIVE   Bilirubin Urine NEGATIVE NEGATIVE   Ketones, ur NEGATIVE NEGATIVE mg/dL   Protein, ur 30 (A) NEGATIVE mg/dL   Nitrite NEGATIVE NEGATIVE   Leukocytes,Ua NEGATIVE NEGATIVE   RBC / HPF 0-5 0 - 5 RBC/hpf   WBC, UA 0-5 0 - 5 WBC/hpf   Bacteria, UA NONE SEEN NONE SEEN  CBC with Differential/Platelet     Status: Abnormal   Collection Time: 02/03/19  9:00 AM  Result Value Ref Range   WBC 10.0 4.0 - 10.5 K/uL   RBC 2.75 (L) 3.87 - 5.11 MIL/uL   Hemoglobin 10.1 (L) 12.0 - 15.0 g/dL   HCT 31.4 (L) 36.0 - 46.0 %   MCV 114.2 (H) 80.0 - 100.0 fL   MCH 36.7 (H) 26.0 - 34.0 pg   MCHC 32.2 30.0 - 36.0 g/dL   RDW 15.3 11.5 - 15.5 %   Platelets 163 150 - 400 K/uL   nRBC 0.0 0.0 - 0.2 %   Neutrophils Relative % 83 %  Neutro Abs 8.4 (H) 1.7 - 7.7 K/uL   Lymphocytes Relative 11 %   Lymphs Abs 1.1 0.7 - 4.0 K/uL   Monocytes Relative 4 %   Monocytes Absolute 0.4 0.1 - 1.0 K/uL   Eosinophils Relative 1 %   Eosinophils Absolute 0.1 0.0 - 0.5 K/uL   Basophils Relative 0 %   Basophils Absolute 0.0 0.0 - 0.1 K/uL   Immature Granulocytes 1 %   Abs Immature Granulocytes 0.05 0.00 - 0.07 K/uL  Comprehensive metabolic panel     Status: Abnormal   Collection Time: 02/03/19  9:00 AM  Result Value Ref Range   Sodium 140 135 - 145 mmol/L   Potassium 3.9 3.5 - 5.1 mmol/L   Chloride 106 98 - 111 mmol/L   CO2 25 22 - 32 mmol/L   Glucose, Bld 95 70 - 99 mg/dL   BUN 35 (H) 8 - 23 mg/dL    Creatinine, Ser 0.86 0.44 - 1.00 mg/dL   Calcium 9.0 8.9 - 10.3 mg/dL   Total Protein 5.6 (L) 6.5 - 8.1 g/dL   Albumin 2.8 (L) 3.5 - 5.0 g/dL   AST 23 15 - 41 U/L   ALT 13 0 - 44 U/L   Alkaline Phosphatase 96 38 - 126 U/L   Total Bilirubin 1.0 0.3 - 1.2 mg/dL   GFR calc non Af Amer >60 >60 mL/min   GFR calc Af Amer >60 >60 mL/min   Anion gap 9 5 - 15    Studies/Results: CT Abdomen Pelvis Wo Contrast  Result Date: 02/01/2019 CLINICAL DATA:  85 year old female with nausea and vomiting and abdominal pain. EXAM: CT ABDOMEN AND PELVIS WITHOUT CONTRAST TECHNIQUE: Multidetector CT imaging of the abdomen and pelvis was performed following the standard protocol without IV contrast. COMPARISON:  CT of the abdomen pelvis dated 05/21/2016. FINDINGS: Evaluation of this exam is limited in the absence of intravenous contrast. Lower chest: There is mild cardiomegaly. Partially visualized calcified mitral annulus. Cardiac pacemaker leads noted. The visualized lung bases are clear. No intra-abdominal free air. There is a small ascites. Hepatobiliary: The liver is grossly unremarkable. No intrahepatic biliary ductal dilatation. Minimal sludge may be present within the gallbladder. No calcified stone or pericholecystic fluid. Pancreas: The pancreas is grossly unremarkable. Spleen: Normal in size without focal abnormality. Adrenals/Urinary Tract: The adrenal glands are unremarkable. There is no hydronephrosis or nephrolithiasis on either side. Multiple bilateral renal hypodense lesions noted which are suboptimally characterized but the larger lesions demonstrate fluid attenuation and present on the prior CT most consistent with cysts. The visualized ureters appear unremarkable. The urinary bladder is minimally distended and grossly unremarkable. Stomach/Bowel: Multiple top-normal caliber and severely inflamed loops of small bowel in the lower abdomen and pelvis measure up to 2.8 cm in diameter. There is severe  inflammatory changes of the mesentery associated with this bowel as well as inflammatory changes of the bowel wall. Findings are concerning for a closed loop obstruction with probable point of obstruction in the left hemipelvis (series 2, image 66 and 67 and coronal series 5, image 63). There is an area of slight mesenteric twisting in the left hemiabdomen (series 5 images 34-53). And underlying omental defect with associated internal hernia is not excluded. Correlation with surgical history recommended. Several foci of air within the inflamed small bowel loops appear to be intraluminal. However, early ischemia is not excluded given the degree of inflammatory changes. Correlation with clinical exam and lactic acid levels recommended. There are scattered colonic diverticula without  active inflammatory changes. The appendix is unremarkable as visualized. Vascular/Lymphatic: There is moderate aortoiliac atherosclerotic disease. The IVC is unremarkable. No portal venous gas. There is no adenopathy. Reproductive: The uterus is grossly unremarkable. No adnexal masses. Other: Small fat containing umbilical hernia. Musculoskeletal: Degenerative changes of the spine. No acute osseous pathology. IMPRESSION: 1. Findings concerning for closed loop obstruction in the lower abdomen/pelvis with possible early ischemia. No definite pneumatosis, portal venous gas, or free air at this time. Correlation with clinical exam, lactic acid levels, and surgical consult is advised. 2. Small ascites. 3. Colonic diverticulosis. 4. Aortic Atherosclerosis (ICD10-I70.0). These results were called by telephone at the time of interpretation on 02/01/2019 at 3:36 pm to physician assistant Alecia Lemming, who verbally acknowledged these results. Electronically Signed   By: Anner Crete M.D.   On: 02/01/2019 15:41   DG Chest Port 1 View  Result Date: 02/01/2019 CLINICAL DATA:  Nausea vomiting diarrhea since last night EXAM: PORTABLE CHEST 1 VIEW  COMPARISON:  02/25/2016 FINDINGS: Cardiomediastinal contours are enlarged. Dual lead pacer device in place. Pack over left chest. Lungs are clear. Postoperative changes over the right breast as before. Visualized skeletal structures without acute finding. IMPRESSION: 1. No active cardiopulmonary disease. 2. Stable enlargement of the cardiomediastinal contours. Electronically Signed   By: Zetta Bills M.D.   On: 02/01/2019 18:30    Scheduled Meds: . bisacodyl  10 mg Rectal Daily  . Chlorhexidine Gluconate Cloth  6 each Topical Daily  . [START ON 02/04/2019] enoxaparin (LOVENOX) injection  40 mg Subcutaneous Daily  . lip balm  1 application Topical BID  . mouth rinse  15 mL Mouth Rinse BID  . metoprolol tartrate  5 mg Intravenous Q6H  . neomycin-polymyxin b-dexamethasone  1 application Right Eye QHS  . polyvinyl alcohol   Right Eye BID  . sodium chloride flush  3 mL Intravenous Once   Continuous Infusions: . sodium chloride 500 mL/hr at 02/03/19 1208  . famotidine (PEPCID) IV    . meropenem (MERREM) IV Stopped (02/03/19 0549)  . methocarbamol (ROBAXIN) IV    . ondansetron (ZOFRAN) IV    . sodium chloride     PRN Meds:sodium chloride, alum & mag hydroxide-simeth, clobetasol cream, diphenhydrAMINE, enalaprilat, haloperidol lactate, labetalol, magic mouthwash, menthol-cetylpyridinium, methocarbamol (ROBAXIN) IV, morphine injection, naphazoline-glycerin, OLANZapine, ondansetron (ZOFRAN) IV **OR** ondansetron (ZOFRAN) IV, phenol, prochlorperazine, simethicone  Assessment/Plan: 1. Bowel obstruction:  POD 1, s/p dx lap with LOA and SBR for closed loop bowel obstruction with early perforation and ischemia with primary umbilical hernia repair 1. Will continue NPO status and continue NG tube as per surgery 2. Cont analgesics and supportive care 3. Repeat labs 4. WOC consult for VAC change as per surgery recommendation; mobilize 5. IS 6. PT OT consulted ambulation as tolerated 7. Appreciate  surgery input  2. HTN: Currently hypotensive 1. Fluid bolus followed by maintenance dose to be continued 2. Monitor closely 3. Hold antihypertensive medications.  Lopressor with parameter. 4. Consulted cardiology.  Appreciate their recommendation  3. Afib and sick sinus syndrome 1. Followed by Dr. Rayann Heman 2. Patient was rate controlled up until yesterday but currently in RVR with 120s to 30s 3. Pt had been on xarelto prior to admit, hold prior to surgery on 02/01/2019.  Consider resuming anticoagulation when OK with general surgery 4. Home medication cardizem, flecainide and Lopressor on hold as patient is unable to have p.o. may resume when allowed to have p.o. diet 5. Consulted cardiology on 02/03/2019. Started amio gtt.  Will follow cardiology recommendations.  Appreciate their recommendation.  4. Sepsis: Resolved 1. Presented with leukocytosis with tachypnea 2. Likely secondary to presenting obstruction 3. Negative blood cx and UA as well as CXR so far 4. Will cont current broad spec abx meropenem as started in ED given pt's numerous allergies  5. ARF: Slowly improving 1. Likely secondary to dehydration 2. Will continue with IVF as tolerated 3. Repeat bmet in AM  6. Agitation: Appears to be having ICU delirium -Needed four-point restraint for now -Added olanzapine and Haldol IM as needed for extreme agitation -Frequent reorientation and family member or family of face to visit the patient -Avoid benzodiazepine for this age group  DVT prophylaxis:  Lovenox Code Status: Full Disposition Plan:  Once a stable Consults called: General Surgery and cardiology Family: Called son who would come to see the patient remain at bedside to orient the patient   LOS: 2 days   Hali Balgobin Izetta Dakin

## 2019-02-03 NOTE — Evaluation (Signed)
Occupational Therapy Evaluation Patient Details Name: Megan Hester MRN: [id number] DOB: 1931/02/05 Today's Date: 02/03/2019    History of Present Illness 85 y.o. female with medical history significant of sick sinus syndrome, afib on chronic xarelto, HTN, GERD who presented to the ED with 1 day of abd discomfort and nausea/vomiting.  Pt s/p dx lap with LOA and SBR for closed loop bowel obstruction with early perforation and ischemia with primary umbilical hernia repair by Dr. Johney Maine on 02/01/19   Clinical Impression   Pt was admitted for the above. At baseline, she is independent and she is the caregiver for her husband.  Pt currently needs min A to stand and for SPT. She has a tendency to lean posteriorly.  She needed multimodal cues to follow commands today.  She needs up to total A for LB adls due to abdominal sx. Will follow in acute setting with supervision level goals.    Follow Up Recommendations  Supervision/Assistance - 24 hour;Home health OT(vs SNF, depending upon family's ability to help and progress)    Equipment Recommendations  3 in 1 bedside commode    Recommendations for Other Services       Precautions / Restrictions Precautions Precautions: Fall Precaution Comments: NG tube; wound vac, abd sx Restrictions Weight Bearing Restrictions: No      Mobility Bed Mobility               General bed mobility comments: sitting EOB with nursing  Transfers   Equipment used: Rolling walker (2 wheeled)   Sit to Stand: Min assist Stand pivot transfers: Min assist       General transfer comment: multimodal cues for hand placement; min A to stand and steady when standing due to posterior lean/LOB    Balance             Standing balance-Leahy Scale: Poor                             ADL either performed or assessed with clinical judgement   ADL Overall ADL's : Needs assistance/impaired Eating/Feeding: NPO Eating/Feeding Details  (indicate cue type and reason): would be independent when allowed to eat Grooming: Minimal assistance Grooming Details (indicate cue type and reason): NG  Upper Body Bathing: Minimal assistance   Lower Body Bathing: Maximal assistance   Upper Body Dressing : Minimal assistance   Lower Body Dressing: Total assistance   Toilet Transfer: Minimal assistance   Toileting- Clothing Manipulation and Hygiene: Maximal assistance         General ADL Comments: tends to have posterior lean LOB in standing; cues for posture     Vision   Additional Comments: R eye denucleation; h/o retinal hemorrhage     Perception     Praxis      Pertinent Vitals/Pain Pain Assessment: Faces Faces Pain Scale: Hurts little more Pain Location: incision/abdomen Pain Descriptors / Indicators: Sore Pain Intervention(s): Limited activity within patient's tolerance;Monitored during session;Repositioned     Hand Dominance     Extremity/Trunk Assessment Upper Extremity Assessment Upper Extremity Assessment: Generalized weakness;RUE deficits/detail;LUE deficits/detail RUE Deficits / Details: can lift 45 FF; decreased IR LUE Deficits / Details: FF to 90           Communication Communication Communication: No difficulties   Cognition Arousal/Alertness: Awake/alert Behavior During Therapy: WFL for tasks assessed/performed Overall Cognitive Status: No family/caregiver present to determine baseline cognitive functioning  General Comments: needed multimodal cues to follow commands.  Pushing up was easier to follow with repetition.  Asked same questions over.  Had stuck in her mind that it was 4 am   General Comments  VSS; HR up to 123    Exercises     Shoulder Instructions      Home Living Family/patient expects to be discharged to:: Private residence Living Arrangements: Spouse/significant other;Children Available Help at Discharge: Family                          Home Equipment: None          Prior Functioning/Environment Level of Independence: Independent        Comments: she reports she did everything for her husband; she is his caregiver.  Daughter in law is helping him now        OT Problem List: Decreased strength;Decreased activity tolerance;Impaired balance (sitting and/or standing);Decreased knowledge of use of DME or AE;Decreased knowledge of precautions;Pain      OT Treatment/Interventions: Self-care/ADL training;Energy conservation;DME and/or AE instruction;Patient/family education;Balance training;Cognitive remediation/compensation;Therapeutic activities    OT Goals(Current goals can be found in the care plan section) Acute Rehab OT Goals Patient Stated Goal: none stated; agreeable to up to chairf OT Goal Formulation: With patient Time For Goal Achievement: 02/17/19 Potential to Achieve Goals: Good ADL Goals Pt Will Transfer to Toilet: with supervision;ambulating;bedside commode Pt Will Perform Toileting - Clothing Manipulation and hygiene: with min assist;sit to/from stand Additional ADL Goal #1: pt will perform UB adl with set up and LB bathing, donning pants with supervision using reacher Additional ADL Goal #2: pt will follow all basic commands in context with vc only  OT Frequency: Min 2X/week   Barriers to D/C:            Co-evaluation              AM-PAC OT "6 Clicks" Daily Activity     Outcome Measure Help from another person eating meals?: None Help from another person taking care of personal grooming?: A Little Help from another person toileting, which includes using toliet, bedpan, or urinal?: Total Help from another person bathing (including washing, rinsing, drying)?: A Lot Help from another person to put on and taking off regular upper body clothing?: A Little Help from another person to put on and taking off regular lower body clothing?: Total 6 Click Score: 14   End of  Session    Activity Tolerance: Patient tolerated treatment well Patient left: in chair;with call bell/phone within reach  OT Visit Diagnosis: Muscle weakness (generalized) (M62.81);Unsteadiness on feet (R26.81)                Time: [id number] OT Time Calculation (min): 17 min Charges:  OT General Charges $OT Visit: 1 Visit OT Evaluation $OT Eval Moderate Complexity: 1 Mod  Mitra Duling S, OTR/L Acute Rehabilitation Services 02/03/2019  Aleeza [place] 02/03/2019, 9:42 AM

## 2019-02-03 NOTE — Consult Note (Signed)
Cardiology Consultation:   Patient ID: Megan Hester MRN: [id number]; DOB: Apr 10, 1930  Admit date: 02/01/2019 Date of Consult: 02/03/2019  Primary Care Provider: [name], MD Primary Cardiologist: No primary care provider on file.  Primary Electrophysiologist:  None    Patient Profile:   Megan Hester is a 85 y.o. female with sick sinus syndrome s/p PPM, atrial fibrillation on Xarelto, hypertension, GERD who presents with small bowel obstruction s/p small bowel resection. She is postop day 2 from her procedure. We are consulted by Dr. Izetta Dakin for evaluation of atrial fibrillation with RVR.  History of Present Illness:   Megan Hester is an 85 y.o. female with sick sinus syndrome s/p PPM, atrial fibrillation on Xarelto, hypertension, GERD who presents with small bowel obstruction s/p small bowel resection. She is postop day 2 from her procedure. We are consulted by Dr. Izetta Dakin for evaluation of atrial fibrillation with RVR.   She presented with nausea/vomiting/abdominal pain. In the ED, notable for hypotension (84/63), creatinine 1.4, hemoglobin 14. Abdominal CT showed closed-loop obstruction in the lower abdomen, possible early ischemia. General surgery surgery was consulted and she underwent small bowel resection on 12/22. She has been in a sinus rhythm for 6 days, but today converted to atrial flutter with rates 110s- 120s. BP fell to 70s over 40s when flutter, currently 90s over 50s.  She is on Xarelto, diltiazem, and flecainide for her atrial fibrillation. Xarelto is currently on hold postoperatively. Diltiazem and flecainide also on hold as not been able to take p.o. currently .  Heart Pathway Score:     Past Medical History:  Diagnosis Date  . Arthritis    "all over my body" (02/25/2016)  . 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 GERD (gastroesophageal reflux disease)    hx; "when I was working"  . Hypertension   . Macular degeneration   . Paroxysmal  atrial fibrillation (H[facility])   . Presence of permanent cardiac pacemaker   . 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 APPLICATION OF WOUND VAC Bilateral 02/01/2019   Procedure: APPLICATION OF WOUND VAC;  Surgeon: [name], MD;  Location: [facility]S;  Service: General;  Laterality: Bilateral;  . BREAST BIOPSY Right   . BREAST LUMPECTOMY Right 1999   RADIATION  . CARDIAC CATHETERIZATION  02/05/1999   HYPERDYNAMIC LEFT VENTRICLE WITH EF OF 75-80%  . CARDIOVERSION N/A 01/22/2015   Procedure: CARDIOVERSION;  Surgeon: [name], MD;  Location[facility] ENDOSCOPY;  Service: Cardiovascular;  Laterality: N/A;  . CARDIOVERSION N/A 02/22/2015   Procedure: CARDIOVERSION;  Surgeon: [name], MD;  Location: [facility] ENDOSCOPY;  Service: Cardiovascular;  Laterality: N/A;  . DILATION AND CURETTAGE OF UTERUS    . ENUCLEATION Right 2012  . EP IMPLANTABLE DEVICE N/A 02/25/2016   Procedure: Pacemaker Implant;  Surgeon: [name], MD;  Location: [facility];  Service: Cardiovascular;  Laterality: N/A;  . INSERT / REPLACE / REMOVE PACEMAKER    . LAPAROSCOPIC SMALL BOWEL RESECTION N/A 02/01/2019   Procedure: LAPAROSCOPIC SMALL BOWEL RESECTION WITH TAP BLOCK;  Surgeon: [name], MD;  Location: [facility]S;  Service: General;  Laterality: N/A;  . LAPAROSCOPY N/A 02/01/2019   Procedure: LAPAROSCOPY DIAGNOSTIC;  Surgeon: [name], MD;  Location: [facility]S;  Service: General;  Laterality: N/A;  . LYSIS OF ADHESION N/A 02/01/2019   Procedure: LYSIS OF ADHESION;  Surgeon: [name],  MD;  Location: [facility]S;  Service: General;  Laterality: N/A;  . TONSILLECTOMY      Inpatient Medications: Scheduled Meds: . bisacodyl  10 mg Rectal Daily  . Chlorhexidine Gluconate Cloth  6 each Topical Daily  . [START ON 02/04/2019] enoxaparin (LOVENOX) injection  40 mg Subcutaneous Daily  . lip balm  1 application Topical BID  . mouth rinse   15 mL Mouth Rinse BID  . metoprolol tartrate  5 mg Intravenous Q6H  . neomycin-polymyxin b-dexamethasone  1 application Right Eye QHS  . polyvinyl alcohol   Right Eye BID  . sodium chloride flush  3 mL Intravenous Once   Continuous Infusions: . sodium chloride 500 mL/hr at 02/03/19 1208  . famotidine (PEPCID) IV    . meropenem (MERREM) IV Stopped (02/03/19 0549)  . methocarbamol (ROBAXIN) IV    . ondansetron (ZOFRAN) IV     PRN Meds: sodium chloride, alum & mag hydroxide-simeth, clobetasol cream, diphenhydrAMINE, enalaprilat, haloperidol lactate, labetalol, magic mouthwash, menthol-cetylpyridinium, methocarbamol (ROBAXIN) IV, morphine injection, naphazoline-glycerin, OLANZapine, ondansetron (ZOFRAN) IV **OR** ondansetron (ZOFRAN) IV, phenol, prochlorperazine, simethicone  Allergies:    Allergies  Allergen Reactions  . Shellfish Allergy Hives and Swelling    Shrimp and lobster only  . Atropine     Causes AFIB  . Codeine     Leg pain  . Doxycycline     unknown  . Fenoprofen Calcium Swelling  . Iodinated Diagnostic Agents Hives and Other (See Comments)  . Isopto Hyoscine [Scopolamine]     Causes AFIB  . Levaquin [Levofloxacin]     Rash & causes AFIB  . Other     Other reaction(s): Bleeding (intolerance) Nutrasweet  Naphon - swelling  . Oxycodone     Depression - pt tolerates as needed   . Penicillins     Has patient had a PCN reaction causing immediate rash, facial/tongue/throat swelling, SOB or lightheadedness with hypotension: unknown Has patient had a PCN reaction causing severe rash involving mucus membranes or skin necrosis: unknown Has patient had a PCN reaction that required hospitalization unknown Has patient had a PCN reaction occurring within the last 10 years: childhood If all of the above answers are "NO", then may proceed with Cephalosporin use. unknown  . Sulfa Drugs Cross Reactors     Ankle swelling   . Warfarin     Other reaction(s): Bleeding  (intolerance) From eyes  . Ceclor [Cefaclor] Hives  . Tape Rash    Use paper tape    Social History:   Social History   Socioeconomic History  . Marital status: Married    Spouse name: Not on file  . Number of children: 3  . Years of education: Not on file  . Highest education level: Not on file  Occupational History    Employer: RETIRED  Tobacco Use  . Smoking status: Never Smoker  . Smokeless tobacco: Never Used  Substance and Sexual Activity  . Alcohol use: Yes    Alcohol/week: 0.0 standard drinks    Comment: 02/25/2016 "glass of wine/month"  . Drug use: No  . Sexual activity: Yes    Birth control/protection: Post-menopausal    Comment: 1st intercourse 51 yo-1 partner  Other Topics Concern  . Not on file  Social History Narrative   Lives in [place].  Retired Solicitor.   Social Determinants of Health   Financial Resource Strain:   . Difficulty of Paying Living Expenses: Not on file  Food Insecurity:   . Worried About Running  Out of Food in the Last Year: Not on file  . Ran Out of Food in the Last Year: Not on file  Transportation Needs:   . Lack of Transportation (Medical): Not on file  . Lack of Transportation (Non-Medical): Not on file  Physical Activity:   . Days of Exercise per Week: Not on file  . Minutes of Exercise per Session: Not on file  Stress:   . Feeling of Stress : Not on file  Social Connections:   . Frequency of Communication with Friends and Family: Not on file  . Frequency of Social Gatherings with Friends and Family: Not on file  . Attends Religious Services: Not on file  . Active Member of Clubs or Organizations: Not on file  . Attends Archivist Meetings: Not on file  . Marital Status: Not on file  Intimate Partner Violence:   . Fear of Current or Ex-Partner: Not on file  . Emotionally Abused: Not on file  . Physically Abused: Not on file  . Sexually Abused: Not on file    Family History:    Family History   Problem Relation Age of Onset  . Hypertension Father   . Heart attack Father   . Parkinsonism Father   . Diabetes Brother   . Heart disease Brother   . Lung cancer Brother   . Stroke Mother      ROS:  Please see the history of present illness.   All other ROS reviewed and negative.     Physical Exam/Data:   Vitals:   02/03/19 1230 02/03/19 1300 02/03/19 1330 02/03/19 1400  BP: (!) 92/59 92/61 (!) 92/56 (!) 92/56  Pulse: (!) 114 (!) 113 (!) 57 (!) 111  Resp: 15 (!) 24 (!) 22 20  Temp:      TempSrc:      SpO2: 99% 100% 97% 97%  Weight:      Height:        Intake/Output Summary (Last 24 hours) at 02/03/2019 1456 Last data filed at 02/03/2019 1229 Gross per 24 hour  Intake 1135.88 ml  Output 1252 ml  Net -116.12 ml   Last 3 Weights 02/03/2019 02/02/2019 02/01/2019  Weight (lbs) 141 lb 8.6 oz 144 lb 10 oz 134 lb 14.7 oz  Weight (kg) 64.2 kg 65.6 kg 61.2 kg     Body mass index is 24.29 kg/m.  General:  in no acute distress HEENT: normal Lymph: no adenopathy Neck: no JVD Endocrine:  No thryomegaly Vascular: No carotid bruits; FA pulses 2+ bilaterally without bruits  Cardiac:  normal S1, S2; tachycardic, irregular Lungs:  clear to auscultation bilaterally, no wheezing, rhonchi or rales  Abd: soft, nontender, no hepatomegaly  Ext: no edema Musculoskeletal:  No deformities, BUE and BLE strength normal and equal Skin: warm and dry  Neuro:  CNs 2-12 intact, no focal abnormalities noted Psych:  Normal affect   EKG:  The EKG was personally reviewed and demonstrates: 02/01/19  NSR with frequent PACS, RBBB.  02/03/19 AF, rate 101 Telemetry:  Telemetry was personally reviewed and demonstrates:  Sinus rhythm in 60s, then converted to AF with rate 100-110s.  Relevant CV Studies: TTE 01/08/15: - Left ventricle: The cavity size was normal. Wall thickness was increased in a pattern of moderate LVH. Systolic function was normal. The estimated ejection fraction was in  the range of 55% to 60%. The study is not technically sufficient to allow evaluation of LV diastolic function. - Aortic valve: Trileaflet. Sclerosis without stenosis.  There was no regurgitation. - Aorta: Aortic root dimension: 40 mm (ED). - Aortic root: The aortic root is dilated. - Mitral valve: Calcified annulus. Mildly thickened leaflets . There was mild regurgitation. - Left atrium: The atrium was at the upper limits of normal in size. - Right atrium: The atrium was at the upper limits of normal in size. - Tricuspid valve: There was mild regurgitation. - Pulmonary arteries: PA peak pressure: 25 mm Hg (S). - Inferior vena cava: The vessel was normal in size. The respirophasic diameter changes were in the normal range (>= 50%), consistent with normal central venous pressure.  Laboratory Data:  High Sensitivity Troponin:  No results for input(s): TROPONINIHS in the last 720 hours.   Chemistry Recent Labs  Lab 02/01/19 1056 02/02/19 0157 02/03/19 0900  NA 138 138 140  K 4.4 4.6 3.9  CL 99 108 106  CO2 22 20* 25  GLUCOSE 211* 160* 95  BUN 27* 33* 35*  CREATININE 1.40* 1.32* 0.86  CALCIUM 10.1 8.6* 9.0  GFRNONAA 34* 36* >60  GFRAA 39* 42* >60  ANIONGAP 17* 10 9    Recent Labs  Lab 02/01/19 1056 02/02/19 0157 02/03/19 0900  PROT 7.1 5.6* 5.6*  ALBUMIN 4.0 3.0* 2.8*  AST 24 20 23   ALT 16 11 13   ALKPHOS 146* 98 96  BILITOT 1.7* 1.1 1.0   Hematology Recent Labs  Lab 02/01/19 1056 02/02/19 0157 02/03/19 0900  WBC 24.6* 8.8 10.0  RBC 3.83* 3.76* 2.75*  HGB 14.0 13.4 10.1*  HCT 42.7 41.4 31.4*  MCV 111.5* 110.1* 114.2*  MCH 36.6* 35.6* 36.7*  MCHC 32.8 32.4 32.2  RDW 14.9 15.0 15.3  PLT 358 185 163   BNPNo results for input(s): BNP, PROBNP in the last 168 hours.  DDimer No results for input(s): DDIMER in the last 168 hours.   Radiology/Studies:  CT Abdomen Pelvis Wo Contrast  Result Date: 02/01/2019 CLINICAL DATA:  85 year old female  with nausea and vomiting and abdominal pain. EXAM: CT ABDOMEN AND PELVIS WITHOUT CONTRAST TECHNIQUE: Multidetector CT imaging of the abdomen and pelvis was performed following the standard protocol without IV contrast. COMPARISON:  CT of the abdomen pelvis dated 05/21/2016. FINDINGS: Evaluation of this exam is limited in the absence of intravenous contrast. Lower chest: There is mild cardiomegaly. Partially visualized calcified mitral annulus. Cardiac pacemaker leads noted. The visualized lung bases are clear. No intra-abdominal free air. There is a small ascites. Hepatobiliary: The liver is grossly unremarkable. No intrahepatic biliary ductal dilatation. Minimal sludge may be present within the gallbladder. No calcified stone or pericholecystic fluid. Pancreas: The pancreas is grossly unremarkable. Spleen: Normal in size without focal abnormality. Adrenals/Urinary Tract: The adrenal glands are unremarkable. There is no hydronephrosis or nephrolithiasis on either side. Multiple bilateral renal hypodense lesions noted which are suboptimally characterized but the larger lesions demonstrate fluid attenuation and present on the prior CT most consistent with cysts. The visualized ureters appear unremarkable. The urinary bladder is minimally distended and grossly unremarkable. Stomach/Bowel: Multiple top-normal caliber and severely inflamed loops of small bowel in the lower abdomen and pelvis measure up to 2.8 cm in diameter. There is severe inflammatory changes of the mesentery associated with this bowel as well as inflammatory changes of the bowel wall. Findings are concerning for a closed loop obstruction with probable point of obstruction in the left hemipelvis (series 2, image 66 and 67 and coronal series 5, image 63). There is an area of slight mesenteric twisting in the left  hemiabdomen (series 5 images 34-53). And underlying omental defect with associated internal hernia is not excluded. Correlation with surgical  history recommended. Several foci of air within the inflamed small bowel loops appear to be intraluminal. However, early ischemia is not excluded given the degree of inflammatory changes. Correlation with clinical exam and lactic acid levels recommended. There are scattered colonic diverticula without active inflammatory changes. The appendix is unremarkable as visualized. Vascular/Lymphatic: There is moderate aortoiliac atherosclerotic disease. The IVC is unremarkable. No portal venous gas. There is no adenopathy. Reproductive: The uterus is grossly unremarkable. No adnexal masses. Other: Small fat containing umbilical hernia. Musculoskeletal: Degenerative changes of the spine. No acute osseous pathology. IMPRESSION: 1. Findings concerning for closed loop obstruction in the lower abdomen/pelvis with possible early ischemia. No definite pneumatosis, portal venous gas, or free air at this time. Correlation with clinical exam, lactic acid levels, and surgical consult is advised. 2. Small ascites. 3. Colonic diverticulosis. 4. Aortic Atherosclerosis (ICD10-I70.0). These results were called by telephone at the time of interpretation on 02/01/2019 at 3:36 pm to physician assistant Alecia Lemming, who verbally acknowledged these results. Electronically Signed   By: Anner Crete M.D.   On: 02/01/2019 15:41   DG Chest Port 1 View  Result Date: 02/01/2019 CLINICAL DATA:  Nausea vomiting diarrhea since last night EXAM: PORTABLE CHEST 1 VIEW COMPARISON:  02/25/2016 FINDINGS: Cardiomediastinal contours are enlarged. Dual lead pacer device in place. Pack over left chest. Lungs are clear. Postoperative changes over the right breast as before. Visualized skeletal structures without acute finding. IMPRESSION: 1. No active cardiopulmonary disease. 2. Stable enlargement of the cardiomediastinal contours. Electronically Signed   By: Zetta Bills M.D.   On: 02/01/2019 18:30   Assessment and Plan:   Atrial fibrillation with  RVR: Status post small bowel resection. Home flecainide, diltiazem, Xarelto on hold as not taking PO.  Has been hypotensive. - Given inability to take PO and hypotension limiting rate-control options, recommend starting amiodarone gtt - Start heparin gtt once OK from surgical standpoint  SSS: s/p PPM  For questions or updates, please contact [place] Please consult [email or web address] for contact info under     Signed, Donato Heinz, MD  02/03/2019 2:56 PM

## 2019-02-03 NOTE — Progress Notes (Signed)
Physical Therapy Treatment Patient Details Name: Megan Hester MRN: [id number] DOB: 25-Jan-1931 Today's Date: 02/03/2019    History of Present Illness 85 y.o. female with medical history significant of sick sinus syndrome, afib on chronic xarelto, HTN, GERD who presented to the ED with 1 day of abd discomfort and nausea/vomiting.  Pt s/p dx lap with LOA and SBR for closed loop bowel obstruction with early perforation and ischemia with primary umbilical hernia repair by Dr. Johney Maine on 02/01/19    PT Comments    Pt assisted with ambulating in hallway and mostly requiring assist due to cognition.  Anticipate good progress once cognition improves.   Follow Up Recommendations  Home health PT;Supervision/Assistance - 24 hour     Equipment Recommendations  Rolling walker with 5" wheels    Recommendations for Other Services       Precautions / Restrictions Precautions Precautions: Fall Precaution Comments: NG tube, NPWT abdominal incision    Mobility  Bed Mobility               General bed mobility comments: pt in recliner  Transfers Overall transfer level: Needs assistance Equipment used: Rolling walker (2 wheeled) Transfers: Sit to/from Stand Sit to Stand: Min assist         General transfer comment: multimodal cues for hand placement; min A to stand and steady  Ambulation/Gait Ambulation/Gait assistance: Min assist;Min guard;+2 safety/equipment Gait Distance (Feet): 300 Feet Assistive device: Rolling walker (2 wheeled) Gait Pattern/deviations: Step-through pattern;Decreased stride length     General Gait Details: min assist for RW negotiating however mostly min/guard, RN assisted with Counselling psychologist    Modified Rankin (Stroke Patients Only)       Balance                                            Cognition Arousal/Alertness: Awake/alert Behavior During Therapy: WFL for tasks  assessed/performed Overall Cognitive Status: No family/caregiver present to determine baseline cognitive functioning                                 General Comments: perseverating on her Richardson Landry coming to visit, able to follow commands      Exercises      General Comments        Pertinent Vitals/Pain Pain Assessment: Faces Faces Pain Scale: Hurts little more Pain Location: incision/abdomen Pain Descriptors / Indicators: Sore;Guarding Pain Intervention(s): Repositioned;Monitored during session    Home Living                      Prior Function            PT Goals (current goals can now be found in the care plan section) Progress towards PT goals: Progressing toward goals    Frequency    Min 3X/week      PT Plan Current plan remains appropriate    Co-evaluation              AM-PAC PT "6 Clicks" Mobility   Outcome Measure  Help needed turning from your back to your side while in a flat bed without using bedrails?: A Lot Help needed moving from lying on your back to sitting on the  side of a flat bed without using bedrails?: A Lot Help needed moving to and from a bed to a chair (including a wheelchair)?: A Little Help needed standing up from a chair using your arms (e.g., wheelchair or bedside chair)?: A Little Help needed to walk in hospital room?: A Little Help needed climbing 3-5 steps with a railing? : A Lot 6 Click Score: 15    End of Session Equipment Utilized During Treatment: Gait belt Activity Tolerance: Patient tolerated treatment well Patient left: in chair;with chair alarm set;with call bell/phone within reach Nurse Communication: Mobility status PT Visit Diagnosis: Difficulty in walking, not elsewhere classified (R26.2)     Time: [id number] PT Time Calculation (min) (ACUTE ONLY): 31 min  Charges:  $Gait Training: 8-22 mins                     Arlyce Dice, DPT Acute Rehabilitation Services Office:  [place] 02/03/2019, 1:50 PM

## 2019-02-03 NOTE — Progress Notes (Signed)
Patient trying to climb out of bed, bed alarm activated. Upon entering the room, patient yelling "get me out of here". Tried reorienting patient but patient started kicking forcefully. Notified MD Izetta Dakin for ankle restraints and wrist restraints due to patient trying to interfere with care (trying to get out of bed, pull lines/tubes out) despite reorientation on multiple attempts. Placed patient in four point restraints, explained to patient that this is for her safety. This situation was also witnessed by Elberon Bing RN. MD Izetta Dakin placing new medication orders as well. Will continue to monitor and assess patient along with bedside RN, Megan T.

## 2019-02-04 DIAGNOSIS — I495 Sick sinus syndrome: Secondary | ICD-10-CM

## 2019-02-04 DIAGNOSIS — R6520 Severe sepsis without septic shock: Secondary | ICD-10-CM

## 2019-02-04 DIAGNOSIS — I480 Paroxysmal atrial fibrillation: Secondary | ICD-10-CM

## 2019-02-04 DIAGNOSIS — A419 Sepsis, unspecified organism: Principal | ICD-10-CM

## 2019-02-04 LAB — BASIC METABOLIC PANEL
Anion gap: 10 (ref 5–15)
BUN: 31 mg/dL — ABNORMAL HIGH (ref 8–23)
CO2: 22 mmol/L (ref 22–32)
Calcium: 8.8 mg/dL — ABNORMAL LOW (ref 8.9–10.3)
Chloride: 109 mmol/L (ref 98–111)
Creatinine, Ser: 0.74 mg/dL (ref 0.44–1.00)
GFR calc Af Amer: >60 mL/min (ref >60)
GFR calc non Af Amer: >60 mL/min (ref >60)
Glucose, Bld: 81 mg/dL (ref 70–99)
Potassium: 4.2 mmol/L (ref 3.5–5.1)
Sodium: 141 mmol/L (ref 135–145)

## 2019-02-04 LAB — CBC WITH DIFFERENTIAL/PLATELET
Abs Immature Granulocytes: 0.04 10*3/uL (ref 0.00–0.07)
Abs Immature Granulocytes: 0.08 10*3/uL — ABNORMAL HIGH (ref 0.00–0.07)
Basophils Absolute: 0 10*3/uL (ref 0.0–0.1)
Basophils Absolute: 0 10*3/uL (ref 0.0–0.1)
Basophils Relative: 0 %
Basophils Relative: 1 %
Eosinophils Absolute: 0.1 10*3/uL (ref 0.0–0.5)
Eosinophils Absolute: 0.1 10*3/uL (ref 0.0–0.5)
Eosinophils Relative: 1 %
Eosinophils Relative: 1 %
HCT: 28.1 % — ABNORMAL LOW (ref 36.0–46.0)
HCT: 28.3 % — ABNORMAL LOW (ref 36.0–46.0)
Hemoglobin: 9.2 g/dL — ABNORMAL LOW (ref 12.0–15.0)
Hemoglobin: 9.4 g/dL — ABNORMAL LOW (ref 12.0–15.0)
Immature Granulocytes: 1 %
Immature Granulocytes: 1 %
Lymphocytes Relative: 17 %
Lymphocytes Relative: 17 %
Lymphs Abs: 1.1 10*3/uL (ref 0.7–4.0)
Lymphs Abs: 1.1 10*3/uL (ref 0.7–4.0)
MCH: 36.4 pg — ABNORMAL HIGH (ref 26.0–34.0)
MCH: 36.7 pg — ABNORMAL HIGH (ref 26.0–34.0)
MCHC: 32.7 g/dL (ref 30.0–36.0)
MCHC: 33.2 g/dL (ref 30.0–36.0)
MCV: 109.7 fL — ABNORMAL HIGH (ref 80.0–100.0)
MCV: 112 fL — ABNORMAL HIGH (ref 80.0–100.0)
Monocytes Absolute: 0.3 10*3/uL (ref 0.1–1.0)
Monocytes Absolute: 0.3 10*3/uL (ref 0.1–1.0)
Monocytes Relative: 4 %
Monocytes Relative: 5 %
Neutro Abs: 4.9 10*3/uL (ref 1.7–7.7)
Neutro Abs: 5 10*3/uL (ref 1.7–7.7)
Neutrophils Relative %: 75 %
Neutrophils Relative %: 77 %
Platelets: 104 10*3/uL — ABNORMAL LOW (ref 150–400)
Platelets: 99 10*3/uL — ABNORMAL LOW (ref 150–400)
RBC: 2.51 MIL/uL — ABNORMAL LOW (ref 3.87–5.11)
RBC: 2.58 MIL/uL — ABNORMAL LOW (ref 3.87–5.11)
RDW: 15.1 % (ref 11.5–15.5)
RDW: 15.2 % (ref 11.5–15.5)
WBC: 6.4 10*3/uL (ref 4.0–10.5)
WBC: 6.6 10*3/uL (ref 4.0–10.5)
nRBC: 0 % (ref 0.0–0.2)
nRBC: 0 % (ref 0.0–0.2)

## 2019-02-04 LAB — MAGNESIUM: Magnesium: 2.2 mg/dL (ref 1.7–2.4)

## 2019-02-04 LAB — HEPARIN LEVEL (UNFRACTIONATED): Heparin Unfractionated: 0.2 IU/mL — ABNORMAL LOW (ref 0.30–0.70)

## 2019-02-04 MED ORDER — HEPARIN (PORCINE) 25000 UT/250ML-% IV SOLN
1150.0000 [IU]/h | INTRAVENOUS | Status: DC
  Administered 2019-02-04: 950 [IU]/h via INTRAVENOUS
  Administered 2019-02-05: 1150 [IU]/h via INTRAVENOUS
  Filled 2019-02-04 (×2): qty 250

## 2019-02-04 NOTE — Progress Notes (Signed)
ANTICOAGULATION CONSULT NOTE - Consult  Pharmacy Consult for IV heparin Indication: atrial fibrillation  Allergies  Allergen Reactions  . Shellfish Allergy Hives and Swelling    Shrimp and lobster only  . Atropine     Causes AFIB  . Codeine     Leg pain  . Doxycycline     unknown  . Fenoprofen Calcium Swelling  . Iodinated Diagnostic Agents Hives and Other (See Comments)  . Isopto Hyoscine [Scopolamine]     Causes AFIB  . Levaquin [Levofloxacin]     Rash & causes AFIB  . Other     Other reaction(s): Bleeding (intolerance) Nutrasweet  Naphon - swelling  . Oxycodone     Depression - pt tolerates as needed   . Penicillins     Has patient had a PCN reaction causing immediate rash, facial/tongue/throat swelling, SOB or lightheadedness with hypotension: unknown Has patient had a PCN reaction causing severe rash involving mucus membranes or skin necrosis: unknown Has patient had a PCN reaction that required hospitalization unknown Has patient had a PCN reaction occurring within the last 10 years: childhood If all of the above answers are "NO", then may proceed with Cephalosporin use. unknown  . Sulfa Drugs Cross Reactors     Ankle swelling   . Warfarin     Other reaction(s): Bleeding (intolerance) From eyes  . Ceclor [Cefaclor] Hives  . Tape Rash    Use paper tape    Patient Measurements: Height: 5\' 4"  (162.6 cm) Weight: 141 lb 8.6 oz (64.2 kg) IBW/kg (Calculated) : 54.7 Heparin Dosing Weight: 61.2  Vital Signs: Temp: 97.6 F (36.4 C) (12/25 1920) Temp Source: Oral (12/25 1920) BP: 105/66 (12/25 1900) Pulse Rate: 120 (12/25 1900)  Labs: Recent Labs    02/02/19 0157 02/03/19 0900 02/04/19 0203 02/04/19 0650 02/04/19 1718  HGB 13.4 10.1*  --  9.4*  9.2*  --   HCT 41.4 31.4*  --  28.3*  28.1*  --   PLT 185 163  --  99*  104*  --   HEPARINUNFRC  --   --   --   --  0.20*  CREATININE 1.32* 0.86 0.74  --   --     Estimated Creatinine Clearance: 43.6 mL/min  (by C-G formula based on SCr of 0.74 mg/dL).  Medications:  Scheduled:  . bisacodyl  10 mg Rectal Daily  . Chlorhexidine Gluconate Cloth  6 each Topical Daily  . lip balm  1 application Topical BID  . mouth rinse  15 mL Mouth Rinse BID  . metoprolol tartrate  5 mg Intravenous Q6H  . neomycin-polymyxin b-dexamethasone  1 application Right Eye QHS  . polyvinyl alcohol   Right Eye BID  . sodium chloride flush  3 mL Intravenous Once    Assessment: Pharmacy is consulted to start heparin IV for 85 yo female with atrial fibrillation.  Pt with recent bowel resection on 12/22. On Xarelto PTA, LD on 12/20. Pt was subsequently changed to prophylactic Lovenox, of which last dose was on 12/24. Surgery requesting no bolus.  Today, 02/04/19   Hgb 10.1, plt 163 ( on 12/24)   Scr 0.74 mg/dl, CrCl 44 ml/min  Last INR was on 12/22 (1.5) and PT was 18.1  Heparin level SUBtherapeutic on 950 units/hr  Goal of Therapy:  Heparin level 0.3-0.7 units/ml Monitor platelets by anticoagulation protocol: Yes   Plan:   Increase heparin to 1150 units/hr; no bolus per surgery request due to recent bowel resection procedure  Recheck  heparin level 8 hrs after rate increase  Daily CBC while on heparin  Monitor for signs and symptoms of bleeding  Reuel Boom, PharmD, BCPS [phone] 02/04/2019, 7:52 PM

## 2019-02-04 NOTE — Progress Notes (Signed)
Progress Note  Patient Name: Megan Hester Date of Encounter: 02/04/2019  Primary Cardiologist: No primary care provider on file.   Subjective   Started on amiodarone gtt yesterday, remains in AF but rates 70-80s.  Denies any chest pain, dyspnea, or palpitations this morning.  Inpatient Medications    Scheduled Meds: . bisacodyl  10 mg Rectal Daily  . Chlorhexidine Gluconate Cloth  6 each Topical Daily  . enoxaparin (LOVENOX) injection  40 mg Subcutaneous Daily  . lip balm  1 application Topical BID  . mouth rinse  15 mL Mouth Rinse BID  . metoprolol tartrate  5 mg Intravenous Q6H  . neomycin-polymyxin b-dexamethasone  1 application Right Eye QHS  . polyvinyl alcohol   Right Eye BID  . sodium chloride flush  3 mL Intravenous Once   Continuous Infusions: . amiodarone 30 mg/hr (02/04/19 0604)  . famotidine (PEPCID) IV Stopped (02/03/19 2206)  . lactated ringers Stopped (02/04/19 0534)  . methocarbamol (ROBAXIN) IV    . ondansetron (ZOFRAN) IV     PRN Meds: alum & mag hydroxide-simeth, clobetasol cream, diphenhydrAMINE, enalaprilat, haloperidol lactate, labetalol, magic mouthwash, menthol-cetylpyridinium, methocarbamol (ROBAXIN) IV, morphine injection, naphazoline-glycerin, OLANZapine, ondansetron (ZOFRAN) IV **OR** ondansetron (ZOFRAN) IV, phenol, prochlorperazine, simethicone   Vital Signs    Vitals:   02/04/19 0430 02/04/19 0500 02/04/19 0530 02/04/19 0600  BP: 123/78 116/68 120/72 132/61  Pulse: 89 65 67 69  Resp: (!) 21 17 16  (!) 22  Temp:      TempSrc:      SpO2: 97% 96% 100% 98%  Weight:      Height:        Intake/Output Summary (Last 24 hours) at 02/04/2019 0630 Last data filed at 02/04/2019 0604 Gross per 24 hour  Intake 2070.97 ml  Output 1070 ml  Net 1000.97 ml   Last 3 Weights 02/04/2019 02/03/2019 02/02/2019  Weight (lbs) 141 lb 8.6 oz 141 lb 8.6 oz 144 lb 10 oz  Weight (kg) 64.2 kg 64.2 kg 65.6 kg      Telemetry    AF with rates  70-80s, intermittently V-paced - Personally Reviewed  ECG    02/01/19 NSR with frequent PACS, RBBB.  02/03/19 AF, rate 101 - Personally Reviewed  Physical Exam   GEN: No acute distress.  Oriented x3 Neck: No JVD Cardiac: irregular, no murmurs Respiratory: Clear to auscultation bilaterally. GI: Soft, non-distended  MS: No edema Neuro:  Nonfocal  Psych: Normal affect   Labs    High Sensitivity Troponin:  No results for input(s): TROPONINIHS in the last 720 hours.    Chemistry Recent Labs  Lab 02/01/19 1056 02/02/19 0157 02/03/19 0900 02/04/19 0203  NA 138 138 140 141  K 4.4 4.6 3.9 4.2  CL 99 108 106 109  CO2 22 20* 25 22  GLUCOSE 211* 160* 95 81  BUN 27* 33* 35* 31*  CREATININE 1.40* 1.32* 0.86 0.74  CALCIUM 10.1 8.6* 9.0 8.8*  PROT 7.1 5.6* 5.6*  --   ALBUMIN 4.0 3.0* 2.8*  --   AST 24 20 23   --   ALT 16 11 13   --   ALKPHOS 146* 98 96  --   BILITOT 1.7* 1.1 1.0  --   GFRNONAA 34* 36* >60 >60  GFRAA 39* 42* >60 >60  ANIONGAP 17* 10 9 10      Hematology Recent Labs  Lab 02/01/19 1056 02/02/19 0157 02/03/19 0900  WBC 24.6* 8.8 10.0  RBC 3.83* 3.76* 2.75*  HGB  14.0 13.4 10.1*  HCT 42.7 41.4 31.4*  MCV 111.5* 110.1* 114.2*  MCH 36.6* 35.6* 36.7*  MCHC 32.8 32.4 32.2  RDW 14.9 15.0 15.3  PLT 358 185 163    BNPNo results for input(s): BNP, PROBNP in the last 168 hours.   DDimer No results for input(s): DDIMER in the last 168 hours.   Radiology    No results found.  Cardiac Studies   TTE 01/08/15: - Left ventricle: The cavity size was normal. Wall thickness was increased in a pattern of moderate LVH. Systolic function was normal. The estimated ejection fraction was in the range of 55% to 60%. The study is not technically sufficient to allow evaluation of LV diastolic function. - Aortic valve: Trileaflet. Sclerosis without stenosis. There was no regurgitation. - Aorta: Aortic root dimension: 40 mm (ED). - Aortic root: The aortic root  is dilated. - Mitral valve: Calcified annulus. Mildly thickened leaflets . There was mild regurgitation. - Left atrium: The atrium was at the upper limits of normal in size. - Right atrium: The atrium was at the upper limits of normal in size. - Tricuspid valve: There was mild regurgitation. - Pulmonary arteries: PA peak pressure: 25 mm Hg (S). - Inferior vena cava: The vessel was normal in size. The respirophasic diameter changes were in the normal range (>= 50%), consistent with normal central venous pressure.  Patient Profile     85 y.o. female with sick sinus syndromes/p PPM, atrial fibrillation on Xarelto, hypertension, GERD who presents with small bowel obstructions/p small bowel resection  Assessment & Plan    Atrial fibrillation with RVR: Status post small bowel resection. Home flecainide, diltiazem, Xarelto on hold as not taking PO.  Went into AF with RVR 12/24, was hypotensive when in RVR.  Started amio gtt, remains in AF but improvement in rates to 70-80s today - Per surgery, will remain NPO today.  Continue amiodarone gtt while NPO, will transition to PO meds once able to take PO - OK to start heparin gtt with no bolus per surgery.  Will start heparin gtt   SSS: s/p PPM  For questions or updates, please contact [place] Please consult [email or web address] for contact info under        Signed, Donato Heinz, MD  02/04/2019, 6:30 AM

## 2019-02-04 NOTE — Progress Notes (Signed)
[facility]:  [phone] General Surgery Progress Note   LOS: 3 days  POD -  3 Days Post-Op  Chief Complaint: Abdominal pain  Assessment and Plan: 1. LAPAROSCOPY DIAGNOSTIC, LYSIS OF ADHESION, LAPAROSCOPIC SMALL BOWEL RESECTION, APPLICATION OF WOUND VAC - 02/01/2019 - Gross  On Merrem  WBC - 10,000 - 02/04/2019  Wound with VAC - to change MWF  Continue NGT until bowel function, needs to ambulate, okay to start heparin from surgery standpoint (no bolus)  2.  Sick sinus syndrome 3.  Atrial fibrillation-  on Xarelto/flecainide/Cardizem - Xarelto on hold for now  Seen by Dr. Gardiner Rhyme - placed on amiodarone, okay to start heparin (no bolus) 4.  Hypertension 5.  Hx right IN nuclear lesion 6.  Creatinine - 0.74 - 02/04/2019 7.  LVT prophylaxis - Lovenox 8.  Confused last PM, required restraints, better today   Principal Problem:   Closed loop SBO (small bowel obstruction)  Active Problems:   A-fib (HCC)   Hypertension   Sick sinus syndrome (HCC)   Sepsis (HCC)   Chronic anticoagulation   Cardiac pacemaker in place - [employer] [id number] Advisa DR MRI   Rectocele   DM (diabetes mellitus) ([place])   History of adenomatous polyp of colon   Bowel obstruction ([place])   Acute focal ischemia of small intestine (HCC)   Subjective:  Alert.  Apparently confused last PM and required restraints.  Better this AM  Her contact is her son, Ersilia Ciolek.    Objective:   Vitals:   02/04/19 0600 02/04/19 0630  BP: 132/61 (!) 143/63  Pulse: 69 78  Resp: (!) 22 17  Temp:    SpO2: 98% 96%     Intake/Output from previous day:  12/24 0701 - 12/25 0700 In: 2071 [I.V.:1134; NG/GT:240; IV Piggyback:697] Out: 1070 [Urine:650; Emesis/NG output:400; Drains:20]  Intake/Output this shift:  No intake/output data recorded.   Physical Exam:   General: WN older WF who is alert and oriented.   She has an NGT   HEENT: Normal. Pupils equal. .   Lungs: Clear.   Abdomen: Soft.   Rare BS   Wound: Has VAC on abdomen - to change today   Lab Results:    Recent Labs    02/02/19 0157 02/03/19 0900  WBC 8.8 10.0  HGB 13.4 10.1*  HCT 41.4 31.4*  PLT 185 163    BMET   Recent Labs    02/03/19 0900 02/04/19 0203  NA 140 141  K 3.9 4.2  CL 106 109  CO2 25 22  GLUCOSE 95 81  BUN 35* 31*  CREATININE 0.86 0.74  CALCIUM 9.0 8.8*    PT/INR   Recent Labs    02/01/19 1638  LABPROT 18.1*  INR 1.5*    ABG  No results for input(s): PHART, HCO3 in the last 72 hours.  Invalid input(s): PCO2, PO2   Studies/Results:  No results found.   Anti-infectives:   Anti-infectives (From admission, onward)   Start     Dose/Rate Route Frequency Ordered Stop   02/02/19 0600  meropenem (MERREM) 1 g in sodium chloride 0.9 % 100 mL IVPB     1 g 200 mL/hr over 30 Minutes Intravenous Every 12 hours 02/01/19 2243 02/04/19 0604   02/01/19 1730  clindamycin (CLEOCIN) IVPB 900 mg     900 mg 100 mL/hr over 30 Minutes Intravenous On call to O.R. 02/01/19 1654 02/01/19 2009   02/01/19 1730  gentamicin (GARAMYCIN) 310 mg in dextrose 5 %  100 mL IVPB     5 mg/kg  61.2 kg 107.8 mL/hr over 60 Minutes Intravenous On call to O.R. 02/01/19 1654 02/01/19 1957   02/01/19 1545  meropenem (MERREM) 1 g in sodium chloride 0.9 % 100 mL IVPB     1 g 200 mL/hr over 30 Minutes Intravenous STAT 02/01/19 1533 02/01/19 1724      Alphonsa Overall, MD, [facility] Office: [phone] 02/04/2019

## 2019-02-04 NOTE — Progress Notes (Signed)
PROGRESS NOTE    Megan Hester  [id number] DOB: 07/30/1930 DOA: 02/01/2019 PCP: [name], MD   Brief Narrative: Patient is an 85 year old Caucasian female with a past medical history significant for but not limited to sick sinus syndrome status post permanent pacemaker, atrial fibrillation on chronic anticoagulation with Xarelto, hypertension, GERD as well as other comorbidities who presented to the ED with 1 day of abdominal discomfort as well as nausea and vomiting.  In the ED she was noted to be hypotensive and was given fluid resuscitation and blood pressure improved.  Creatinine was elevated and she had a CT scan that was noted for closed-loop obstruction in the lower abdomen with possible early ischemia.  She underwent surgical intervention with a laparoscopic and diagnostic lysis of adhesions overall laparoscopic small bowel resection and application of the wound VAC.  Postoperatively she had some delirium which is now improved.  Subsequently her heart rate went into atrial fibrillation with RVR and cardiology was consulted and she is in place on amiodarone drip as well as a heparin drip.  She is currently n.p.o. and still has an NG tube in and general surgery recommends continued and for now.  Assessment & Plan:   Principal Problem:   Closed loop SBO (small bowel obstruction)  Active Problems:   A-fib (HCC)   Hypertension   Sick sinus syndrome (HCC)   Sepsis (HCC)   Chronic anticoagulation   Cardiac pacemaker in place - [employer] [id number] Advisa DR MRI   Rectocele   DM (diabetes mellitus) ([place])   History of adenomatous polyp of colon   Bowel obstruction (HCC)   Acute focal ischemia of small intestine (HCC)  Closed SBO s/p LAPAROSCOPY DIAGNOSTIC, LYSIS OF ADHESION, LAPAROSCOPIC SMALL BOWEL RESECTION, APPLICATION OF WOUND VAC, POD 3 -Appreciate Surgical evaluation and Assistance  -C/w IV Meropenem for now -NPO; C/w NGT until bowel function returns -Ambulate   -She has a wound VAC in place and appreciate wound nurse evaluation -Continue all medications to IV if possible given that she is n.p.o.  -Pain control per general surgery and she is on morphine 1 mg every 4 hours as needed severe pain along with methocarbamol 1000 g IV every 6 hours as needed muscle spasms -Continue with antiemetics with Compazine IV every 4 hours as needed for refractory nausea vomiting as well as ondansetron 4 mg IV every 6 hours as needed nausea vomiting or any milligrams IV for 6 hours in nausea vomiting  HTN but became hypotensive in the setting of her RVR -Fluid boluses were ordered yesterday followed by maintenance dose to be continued with LR at 75 mils per hour -Monitor closely -Hold antihypertensive medications.  Lopressor with parameter. -Consulted cardiology and they recommend holding her p.o. medications as she is still n.p.o. and resuming them once she is able -She is on amiodarone drip -Blood pressure has remained stable and was 111/65 -Patient has Enalaprilat 0.625-1.25 mg IV every 6 hours as needed for systolic blood pressure greater than [id number] or diastolic blood pressure greater than 100, as well as labetalol 5 mg IV every 2 as needed for heart rate greater than 120  -Also has metoprolol tartrate IV 5 mg every 6 hours scheduled -Bowel regimen per general surgery -Diet advancement per general surgery -Continue to monitor and follow clinical response to intervention  Paroxysmal Afib and SSS s/p PPM -Followed by Dr. Rayann Heman in the outpatient setting  -Patient was rate controlled up until the day before yesterday but currently in RVR with 120s  to 9s -Pt had been on Carelto prior to admit, hold prior to surgery on 02/01/2019. Resuming anticoagulation as ok with General Surgery but recommending a Heparin gtt as she is NPO except Ice Chips as she has a NGT in Place  -Home medication Cardizem, flecainide and Lopressor on hold as patient is unable to have p.o. may  resume when allowed to have p.o. diet -Consulted Cardiology on 02/03/2019. Started amio gtt and she is on metoprolol tartrate 5 mg IV every 6 hours scheduled -Will follow cardiology recommendations and appreciate further assistance -Now on a heparin drip for anticoagulation and heart rates have been in 70s 80s with A. Fib; cardiology recommends continuing amiodarone GTT while she is n.p.o. and transition to p.o. meds once able to take p.o.  Sepsis from SBO/?Intraabdominal Infection  -Resolved -Presented with leukocytosis with tachypnea; WBC was 24.6 and now improved to 10.0 -Likely secondary to presenting obstruction -Negative Blood Cx x2 at 3 Days and UA as well as CXR so far -Will cont current broad spec abx Meropenem as started in ED given pt's numerous allergies -May de-escalate in the AM but that will be in regards to Clinical Response -Repeat CBC in AM   AKI: Slowly improving Metabolic Acidosis  -Likely secondary to dehydration -Patient's BUN/Cr went from 33/1.32 -> 35/0.86 -> 31/0.74 -Patient's CO2 is now 22, Chloride is 109, and AG is 10  -Will continue with IVF as tolerated -Repeat bmet in AM  Agitation and Confusion, improved -Appeared to be having ICU delirium -Needed four-point restraints but now removed -Added olanzapine and Haldol IM as needed for extreme agitation -Frequent reorientation and family member or family of face to visit the patient -Avoid benzodiazepine for this age group  Macrocytic Anemia -Patient's Hgb/Hct Went from 13.4/41.4 -> 10.1/31.4 -Check Anemia Panel in the AM -Continue to Monitor for S/Sx of Bleeding; Currently no overt bleeding noted -Repeat CBC in AM   Hx of Right Breast Cancer -Follow up with Oncology in the outpatient setting   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 -Continue with Maxitrol 1 application the right eye nightly along with the following-glycerin 1 to 2 drops both eyes 4 times a day as needed for eye  irritation as well as polyvinyl alcohol 1.4% ophthalmic solution right eye twice daily  DVT prophylaxis: Anticoagulated with Heparin gtt Code Status: FULL CODE  Family Communication: No family present at bedside  Disposition Plan: Pending Surgical Clearance   Consultants:   General Surgery  Cardiology    Procedures:  LAPAROSCOPY DIAGNOSTIC WITH LYSIS OF ADHESION SMALL BOWEL RESECTION TAP BLOCK PRIMARY UMBILICAL HERNIA REPAIR APPLICATION OF WOUND VAC -Done by Dr. [name] on 02/01/2019    Antimicrobials:  Anti-infectives (From admission, onward)   Start     Dose/Rate Route Frequency Ordered Stop   02/02/19 0600  meropenem (MERREM) 1 g in sodium chloride 0.9 % 100 mL IVPB     1 g 200 mL/hr over 30 Minutes Intravenous Every 12 hours 02/01/19 2243 02/04/19 0604   02/01/19 1730  clindamycin (CLEOCIN) IVPB 900 mg     900 mg 100 mL/hr over 30 Minutes Intravenous On call to O.R. 02/01/19 1654 02/01/19 2009   02/01/19 1730  gentamicin (GARAMYCIN) 310 mg in dextrose 5 % 100 mL IVPB     5 mg/kg  61.2 kg 107.8 mL/hr over 60 Minutes Intravenous On call to O.R. 02/01/19 1654 02/01/19 1957   02/01/19 1545  meropenem (MERREM) 1 g in sodium chloride 0.9 % 100 mL  IVPB     1 g 200 mL/hr over 30 Minutes Intravenous STAT 02/01/19 1533 02/01/19 1724     Subjective: Seen and examined in bedside and she is calmer today and restraints are removed.  She does not complain of any nausea or vomiting but did have some abdominal soreness and pain.  Wound VAC in place.  No other concerns at present this time and heart rates have improved.  Objective: Vitals:   02/04/19 0530 02/04/19 0600 02/04/19 0630 02/04/19 0700  BP: 120/72 132/61 (!) 143/63 131/71  Pulse: 67 69 78 (!) 110  Resp: 16 (!) 22 17 (!) 21  Temp:      TempSrc:      SpO2: 100% 98% 96% 98%  Weight:      Height:        Intake/Output Summary (Last 24 hours) at 02/04/2019 0741 Last data filed at 02/04/2019 [id number] Gross per 24  hour  Intake 2070.97 ml  Output 1070 ml  Net 1000.97 ml   Filed Weights   02/02/19 0500 02/03/19 0500 02/04/19 0405  Weight: 65.6 kg 64.2 kg 64.2 kg   Examination: Physical Exam:  Constitutional: WN/WD elderly Caucasian female currently in NAD and appears calm  Eyes: 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 Grossly normal hearing.  NG tube in place and is in the left nare Neck: Appears normal, supple, no cervical masses, normal ROM, no appreciable thyromegaly; no JVD Respiratory: Diminished to auscultation bilaterally, no wheezing, rales, rhonchi or crackles. Normal respiratory effort and patient is not tachypenic. No accessory muscle use.  Unlabored breathing Cardiovascular: Irregularly irregular, no murmurs / rubs / gallops. S1 and S2 auscultated. No extremity edema.  Abdomen: Soft, tender to palpate with mild distention and has a wound VAC in place. Bowel sounds are diminished.  GU: Deferred. Musculoskeletal: No clubbing / cyanosis of digits/nails. No joint deformity upper and lower extremities.  Skin: No rashes, lesions, ulcers on a limited skin evaluation. No induration; Warm and dry.  Neurologic: CN 2-12 grossly intact with no focal deficits. Romberg sign and cerebellar reflexes not assessed.  Psychiatric: Slightly impaired judgment and insight. Normal mood and appropriate affect.   Data Reviewed: I have personally reviewed following labs and imaging studies  CBC: Recent Labs  Lab 02/01/19 1056 02/02/19 0157 02/03/19 0900  WBC 24.6* 8.8 10.0  NEUTROABS  --   --  8.4*  HGB 14.0 13.4 10.1*  HCT 42.7 41.4 31.4*  MCV 111.5* 110.1* 114.2*  PLT 358 185 [id number]   Basic Metabolic Panel: Recent Labs  Lab 02/01/19 1056 02/02/19 0157 02/03/19 0900 02/04/19 0203  NA 138 138 140 141  K 4.4 4.6 3.9 4.2  CL 99 108 106 109  CO2 22 20* 25 22  GLUCOSE 211* 160* 95 81  BUN 27* 33* 35* 31*  CREATININE 1.40* 1.32* 0.86  0.74  CALCIUM 10.1 8.6* 9.0 8.8*  MG  --  2.1  --  2.2   GFR: Estimated Creatinine Clearance: 43.6 mL/min (by C-G formula based on SCr of 0.74 mg/dL). Liver Function Tests: Recent Labs  Lab 02/01/19 1056 02/02/19 0157 02/03/19 0900  AST 24 20 23   ALT 16 11 13   ALKPHOS 146* 98 96  BILITOT 1.7* 1.1 1.0  PROT 7.1 5.6* 5.6*  ALBUMIN 4.0 3.0* 2.8*   Recent Labs  Lab 02/01/19 1056  LIPASE 26   No results for input(s): AMMONIA in the last 168 hours. Coagulation Profile:  Recent Labs  Lab 02/01/19 1638  INR 1.5*   Cardiac Enzymes: No results for input(s): CKTOTAL, CKMB, CKMBINDEX, TROPONINI in the last 168 hours. BNP (last 3 results) No results for input(s): PROBNP in the last 8760 hours. HbA1C: Recent Labs    02/02/19 0157  HGBA1C 5.1   CBG: No results for input(s): GLUCAP in the last 168 hours. Lipid Profile: No results for input(s): CHOL, HDL, LDLCALC, TRIG, CHOLHDL, LDLDIRECT in the last 72 hours. Thyroid Function Tests: No results for input(s): TSH, T4TOTAL, FREET4, T3FREE, THYROIDAB in the last 72 hours. Anemia Panel: No results for input(s): VITAMINB12, FOLATE, FERRITIN, TIBC, IRON, RETICCTPCT in the last 72 hours. Sepsis Labs: Recent Labs  Lab 02/01/19 1638  LATICACIDVEN 2.9*    Recent Results (from the past 240 hour(s))  Culture, blood (routine x 2)     Status: None (Preliminary result)   Collection Time: 02/01/19  4:35 PM   Specimen: BLOOD  Result Value Ref Range Status   Specimen Description   Final    BLOOD RIGHT WRIST Performed at [facility] [address], [place], [place] [address]    Special Requests   Final    BOTTLES DRAWN AEROBIC AND ANAEROBIC Blood Culture adequate volume Performed at [facility] [address], [place], [place] [address]    Culture   Final    NO GROWTH 3 DAYS Performed at [facility], [place] [address] [place], [place] [address]    Report Status PENDING  Incomplete   Respiratory Panel by RT PCR (Flu A&B, Covid) - Nasopharyngeal Swab     Status: None   Collection Time: 02/01/19  4:38 PM   Specimen: Nasopharyngeal Swab  Result Value Ref Range Status   SARS Coronavirus 2 by RT PCR NEGATIVE NEGATIVE Final    Comment: (NOTE) SARS-CoV-2 target nucleic acids are NOT DETECTED. The SARS-CoV-2 RNA is generally detectable in upper respiratoy specimens during the acute phase of infection. The lowest concentration of SARS-CoV-2 viral copies this assay can detect is 131 copies/mL. A negative result does not preclude SARS-Cov-2 infection and should not be used as the sole basis for treatment or other patient management decisions. A negative result may occur with  improper specimen collection/handling, submission of specimen other than nasopharyngeal swab, presence of viral mutation(s) within the areas targeted by this assay, and inadequate number of viral copies (<131 copies/mL). A negative result must be combined with clinical observations, patient history, and epidemiological information. The expected result is Negative. Fact Sheet for Patients:  PinkCheek.be Fact Sheet for Healthcare Providers:  GravelBags.it This test is not yet ap proved or cleared by the [place] FDA and  has been authorized for detection and/or diagnosis of SARS-CoV-2 by FDA under an Emergency Use Authorization (EUA). This EUA will remain  in effect (meaning this test can be used) for the duration of the COVID-19 declaration under Section 564(b)(1) of the Act, 21 U.S.C. section 360bbb-3(b)(1), unless the authorization is terminated or revoked sooner.    Influenza A by PCR NEGATIVE NEGATIVE Final   Influenza B by PCR NEGATIVE NEGATIVE Final    Comment: (NOTE) The Xpert Xpress SARS-CoV-2/FLU/RSV assay is intended as an aid in  the diagnosis of influenza from Nasopharyngeal swab specimens and  should not be used as a sole  basis for treatment. Nasal washings and  aspirates are unacceptable for Xpert Xpress SARS-CoV-2/FLU/RSV  testing. Fact Sheet for Patients: PinkCheek.be Fact Sheet for Healthcare Providers: GravelBags.it This test is  not yet approved or cleared by the [place] and  has been authorized for detection and/or diagnosis of SARS-CoV-2 by  FDA under an Emergency Use Authorization (EUA). This EUA will remain  in effect (meaning this test can be used) for the duration of the  Covid-19 declaration under Section 564(b)(1) of the Act, 21  U.S.C. section 360bbb-3(b)(1), unless the authorization is  terminated or revoked. Performed at [facility], [place] [address] [place], [place] [address]   Culture, blood (routine x 2)     Status: None (Preliminary result)   Collection Time: 02/01/19  4:51 PM   Specimen: BLOOD  Result Value Ref Range Status   Specimen Description   Final    BLOOD RIGHT ANTECUBITAL Performed at [facility] [address] [place], [place] [address]    Special Requests   Final    BOTTLES DRAWN AEROBIC AND ANAEROBIC Blood Culture adequate volume Performed at [facility] [address] [place], [place] [address]    Culture   Final    NO GROWTH 3 DAYS Performed at [facility], [place] [address], [place], [place] [address]    Report Status PENDING  Incomplete  MRSA PCR Screening     Status: None   Collection Time: 02/01/19 10:49 PM   Specimen: Nasal Mucosa; Nasopharyngeal  Result Value Ref Range Status   MRSA by PCR NEGATIVE NEGATIVE Final    Comment:        The GeneXpert MRSA Assay (FDA approved for NASAL specimens only), is one component of a comprehensive MRSA colonization surveillance program. It is not intended to diagnose MRSA infection nor to guide or monitor treatment for MRSA infections. Performed at [facility],  [place] [address] [place],  [address]    Radiology Studies: No results found.  Scheduled Meds: . bisacodyl  10 mg Rectal Daily  . Chlorhexidine Gluconate Cloth  6 each Topical Daily  . enoxaparin (LOVENOX) injection  40 mg Subcutaneous Daily  . lip balm  1 application Topical BID  . mouth rinse  15 mL Mouth Rinse BID  . metoprolol tartrate  5 mg Intravenous Q6H  . neomycin-polymyxin b-dexamethasone  1 application Right Eye QHS  . polyvinyl alcohol   Right Eye BID  . sodium chloride flush  3 mL Intravenous Once   Continuous Infusions: . amiodarone 30 mg/hr (02/04/19 0604)  . famotidine (PEPCID) IV Stopped (02/03/19 2206)  . lactated ringers Stopped (02/04/19 0534)  . methocarbamol (ROBAXIN) IV    . ondansetron (ZOFRAN) IV       LOS: 3 days   Kerney Elbe, DO [facility] PAGER is on [place]  If 7PM-7AM, please contact night-coverage [email or web address]

## 2019-02-04 NOTE — Progress Notes (Signed)
ANTICOAGULATION CONSULT NOTE - Consult  Pharmacy Consult for IV heparin Indication: atrial fibrillation  Allergies  Allergen Reactions  . Shellfish Allergy Hives and Swelling    Shrimp and lobster only  . Atropine     Causes AFIB  . Codeine     Leg pain  . Doxycycline     unknown  . Fenoprofen Calcium Swelling  . Iodinated Diagnostic Agents Hives and Other (See Comments)  . Isopto Hyoscine [Scopolamine]     Causes AFIB  . Levaquin [Levofloxacin]     Rash & causes AFIB  . Other     Other reaction(s): Bleeding (intolerance) Nutrasweet  Naphon - swelling  . Oxycodone     Depression - pt tolerates as needed   . Penicillins     Has patient had a PCN reaction causing immediate rash, facial/tongue/throat swelling, SOB or lightheadedness with hypotension: unknown Has patient had a PCN reaction causing severe rash involving mucus membranes or skin necrosis: unknown Has patient had a PCN reaction that required hospitalization unknown Has patient had a PCN reaction occurring within the last 10 years: childhood If all of the above answers are "NO", then may proceed with Cephalosporin use. unknown  . Sulfa Drugs Cross Reactors     Ankle swelling   . Warfarin     Other reaction(s): Bleeding (intolerance) From eyes  . Ceclor [Cefaclor] Hives  . Tape Rash    Use paper tape    Patient Measurements: Height: 5\' 4"  (162.6 cm) Weight: 141 lb 8.6 oz (64.2 kg) IBW/kg (Calculated) : 54.7 Heparin Dosing Weight:   Vital Signs: Temp: 98.6 F (37 C) (12/25 0347) Temp Source: Axillary (12/25 0347) BP: 143/71 (12/25 0800) Pulse Rate: 67 (12/25 0800)  Labs: Recent Labs    02/01/19 1056 02/01/19 1638 02/02/19 0157 02/03/19 0900 02/04/19 0203  HGB 14.0  --  13.4 10.1*  --   HCT 42.7  --  41.4 31.4*  --   PLT 358  --  185 163  --   LABPROT  --  18.1*  --   --   --   INR  --  1.5*  --   --   --   CREATININE 1.40*  --  1.32* 0.86 0.74    Estimated Creatinine Clearance: 43.6  mL/min (by C-G formula based on SCr of 0.74 mg/dL).   Medical History: Past Medical History:  Diagnosis Date  . Arthritis    "all over my body" (02/25/2016)  . 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 GERD (gastroesophageal reflux disease)    hx; "when I was working"  . Hypertension   . Macular degeneration   . Paroxysmal atrial fibrillation (HCC)   . Presence of permanent cardiac pacemaker   . 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    Medications:  Scheduled:  . bisacodyl  10 mg Rectal Daily  . Chlorhexidine Gluconate Cloth  6 each Topical Daily  . lip balm  1 application Topical BID  . mouth rinse  15 mL Mouth Rinse BID  . metoprolol tartrate  5 mg Intravenous Q6H  . neomycin-polymyxin b-dexamethasone  1 application Right Eye QHS  . polyvinyl alcohol   Right Eye BID  . sodium chloride flush  3 mL Intravenous Once    Assessment: Pharmacy is consulted to start heparin IV for 85 yo female with atrial fibrillation.  Pt with recent bowel resection on 12/22. On Xarelto PTA, LD on 12/20. Pt was  subsequently changed to prophylactic Lovenox, of which last dose was on 12/24.   Today, 02/04/19   Hgb 10.1, plt 163 ( on 12/24)   Scr 0.74 mg/dl, CrCl 44 ml/min  Last INR was on 12/22 (1.5) and PT was 18.1    Goal of Therapy:  Heparin level 0.3-0.7 units/ml Monitor platelets by anticoagulation protocol: Yes   Plan:   No bolus per surgery request due to recent bowel resection procedure  Start heparin at 950 units/hr   Daily CBC while on heparin  HL 8 hours after start of heparin drip   Monitor for signs and symptoms of bleeding   Royetta Asal, PharmD, BCPS 02/04/2019 8:43 AM

## 2019-02-05 DIAGNOSIS — Z7901 Long term (current) use of anticoagulants: Secondary | ICD-10-CM

## 2019-02-05 DIAGNOSIS — Z950 Presence of cardiac pacemaker: Secondary | ICD-10-CM

## 2019-02-05 DIAGNOSIS — K56609 Unspecified intestinal obstruction, unspecified as to partial versus complete obstruction: Secondary | ICD-10-CM

## 2019-02-05 DIAGNOSIS — K55011 Focal (segmental) acute (reversible) ischemia of small intestine: Secondary | ICD-10-CM

## 2019-02-05 LAB — COMPREHENSIVE METABOLIC PANEL
ALT: 15 U/L (ref 0–44)
AST: 21 U/L (ref 15–41)
Albumin: 2.6 g/dL — ABNORMAL LOW (ref 3.5–5.0)
Alkaline Phosphatase: 93 U/L (ref 38–126)
Anion gap: 10 (ref 5–15)
BUN: 28 mg/dL — ABNORMAL HIGH (ref 8–23)
CO2: 25 mmol/L (ref 22–32)
Calcium: 8.7 mg/dL — ABNORMAL LOW (ref 8.9–10.3)
Chloride: 109 mmol/L (ref 98–111)
Creatinine, Ser: 0.62 mg/dL (ref 0.44–1.00)
GFR calc Af Amer: >60 mL/min (ref >60)
GFR calc non Af Amer: >60 mL/min (ref >60)
Glucose, Bld: 87 mg/dL (ref 70–99)
Potassium: 3.4 mmol/L — ABNORMAL LOW (ref 3.5–5.1)
Sodium: 144 mmol/L (ref 135–145)
Total Bilirubin: 0.8 mg/dL (ref 0.3–1.2)
Total Protein: 5.2 g/dL — ABNORMAL LOW (ref 6.5–8.1)

## 2019-02-05 LAB — CBC
HCT: 33.7 % — ABNORMAL LOW (ref 36.0–46.0)
Hemoglobin: 10.5 g/dL — ABNORMAL LOW (ref 12.0–15.0)
MCH: 35.8 pg — ABNORMAL HIGH (ref 26.0–34.0)
MCHC: 31.2 g/dL (ref 30.0–36.0)
MCV: 115 fL — ABNORMAL HIGH (ref 80.0–100.0)
Platelets: 162 10*3/uL (ref 150–400)
RBC: 2.93 MIL/uL — ABNORMAL LOW (ref 3.87–5.11)
RDW: 14.9 % (ref 11.5–15.5)
WBC: 5.3 10*3/uL (ref 4.0–10.5)
nRBC: 0 % (ref 0.0–0.2)

## 2019-02-05 LAB — MAGNESIUM: Magnesium: 2 mg/dL (ref 1.7–2.4)

## 2019-02-05 LAB — HEPARIN LEVEL (UNFRACTIONATED)
Heparin Unfractionated: 0.37 IU/mL (ref 0.30–0.70)
Heparin Unfractionated: 0.19 IU/mL — ABNORMAL LOW (ref 0.30–0.70)
Heparin Unfractionated: 0.33 IU/mL (ref 0.30–0.70)

## 2019-02-05 LAB — PHOSPHORUS: Phosphorus: 2.3 mg/dL — ABNORMAL LOW (ref 2.5–4.6)

## 2019-02-05 MED ORDER — POTASSIUM PHOSPHATES 15 MMOLE/5ML IV SOLN
20.0000 mmol | Freq: Once | INTRAVENOUS | Status: AC
  Administered 2019-02-05: 20 mmol via INTRAVENOUS
  Filled 2019-02-05: qty 6.67

## 2019-02-05 MED ORDER — SODIUM CHLORIDE 0.9% FLUSH: 10.0000 mL | INTRAVENOUS | Status: DC | PRN

## 2019-02-05 MED ORDER — SODIUM CHLORIDE 0.9% FLUSH
10.0000 mL | Freq: Two times a day (BID) | INTRAVENOUS | Status: DC
  Administered 2019-02-05 – 2019-02-11 (×12): 10 mL
  Administered 2019-02-12: 40 mL
  Administered 2019-02-12 – 2019-02-13 (×2): 10 mL
  Administered 2019-02-13: 40 mL
  Administered 2019-02-14 – 2019-02-16 (×5): 10 mL

## 2019-02-05 NOTE — Progress Notes (Signed)
[facility] for IV heparin Indication: atrial fibrillation  Allergies  Allergen Reactions  . Shellfish Allergy Hives and Swelling    Shrimp and lobster only  . Atropine     Causes AFIB  . Codeine     Leg pain  . Doxycycline     unknown  . Fenoprofen Calcium Swelling  . Iodinated Diagnostic Agents Hives and Other (See Comments)  . Isopto Hyoscine [Scopolamine]     Causes AFIB  . Levaquin [Levofloxacin]     Rash & causes AFIB  . Other     Other reaction(s): Bleeding (intolerance) Nutrasweet  Naphon - swelling  . Oxycodone     Depression - pt tolerates as needed   . Penicillins     Has patient had a PCN reaction causing immediate rash, facial/tongue/throat swelling, SOB or lightheadedness with hypotension: unknown Has patient had a PCN reaction causing severe rash involving mucus membranes or skin necrosis: unknown Has patient had a PCN reaction that required hospitalization unknown Has patient had a PCN reaction occurring within the last 10 years: childhood If all of the above answers are "NO", then may proceed with Cephalosporin use. unknown  . Sulfa Drugs Cross Reactors     Ankle swelling   . Warfarin     Other reaction(s): Bleeding (intolerance) From eyes  . Ceclor [Cefaclor] Hives  . Tape Rash    Use paper tape    Patient Measurements: Height: 5\' 4"  (162.6 cm) Weight: 141 lb 8.6 oz (64.2 kg) IBW/kg (Calculated) : 54.7 Heparin Dosing Weight: 61.2  Vital Signs: Temp: 97.6 F (36.4 C) (12/26 1958) Temp Source: Oral (12/26 1958) BP: 129/85 (12/26 1958) Pulse Rate: 101 (12/26 1958)  Labs: Recent Labs    02/03/19 0900 02/03/19 0900 02/04/19 0203 02/04/19 0650 02/05/19 0407 02/05/19 0415 02/05/19 0841 02/05/19 1248 02/05/19 2043  HGB 10.1*  --   --  9.4*  9.2* 10.5*  --   --   --   --   HCT 31.4*  --   --  28.3*  28.1* 33.7*  --   --   --   --   PLT 163  --   --  99*  104* 162  --   --   --   --   HEPARINUNFRC  --     < >  --   --   --  0.37  --  0.19* 0.33  CREATININE 0.86  --  0.74  --   --   --  0.62  --   --    < > = values in this interval not displayed.    Estimated Creatinine Clearance: 43.6 mL/min (by C-G formula based on SCr of 0.62 mg/dL).  Medications:  Scheduled:  . bisacodyl  10 mg Rectal Daily  . Chlorhexidine Gluconate Cloth  6 each Topical Daily  . lip balm  1 application Topical BID  . mouth rinse  15 mL Mouth Rinse BID  . metoprolol tartrate  5 mg Intravenous Q6H  . neomycin-polymyxin b-dexamethasone  1 application Right Eye QHS  . polyvinyl alcohol   Right Eye BID  . sodium chloride flush  10-40 mL Intracatheter Q12H  . sodium chloride flush  3 mL Intravenous Once    Assessment: Pharmacy is consulted to start heparin IV for 85 yo female with atrial fibrillation.  Pt with recent bowel resection on 12/22. On Xarelto PTA, LD on 12/20. Pt was subsequently changed to prophylactic Lovenox, of which last dose  was on 12/24. Surgery requesting no bolus.  Today, 12/26  Noon heparin level low d/t heparin being paused for ~1 hr just before level drawn d/t loss of IV site. Heparin level retimed 8 hrs later and now therapeutic on 1150 units/hr   H/H low but stable, Plts WNL  No line or infusion issues per RN   Goal of Therapy:  Heparin level 0.3-0.7 units/ml Monitor platelets by anticoagulation protocol: Yes   Plan:   Continue heparin drip at 1150 units/hr.   no bolus per surgery request due to recent bowel resection procedure  Daily CBC and heparin level  Monitor for signs and symptoms of bleeding  Reuel Boom, PharmD, BCPS [phone] 02/05/2019, 10:04 PM

## 2019-02-05 NOTE — Progress Notes (Signed)
[facility] [place] [facility]:  [phone] General Surgery Progress Note   LOS: 4 days  POD -  4 Days Post-Op  Chief Complaint: Abdominal pain  Assessment and Plan: 1. LAPAROSCOPY DIAGNOSTIC, LYSIS OF ADHESION, LAPAROSCOPIC SMALL BOWEL RESECTION, APPLICATION OF WOUND VAC - 02/01/2019 - Gross  On Merrem  WBC - 5,300 - 02/05/2019  Wound with VAC - to change MWF  Removed NGT today - to give liquids from the floor.  If this goes well, well start diet tomorrow.  If she does well with liquids today, may start oral anticoagulation tomorrow.  2.  Sick sinus syndrome 3.  Atrial fibrillation-  on Xarelto/flecainide/Cardizem - Xarelto on hold for now  Seen by Dr. Gardiner Rhyme - placed on amiodarone, on IV heparin (started 12/25) 4.  Hypertension 5.  Hx right IN nuclear lesion 6.  DVT prophylaxis - on IV heparin 7.  Mildly confused yesterday, doing well this AM   Principal Problem:   Closed loop SBO (small bowel obstruction)  Active Problems:   A-fib (HCC)   Hypertension   Sick sinus syndrome (HCC)   Sepsis (HCC)   Chronic anticoagulation   Cardiac pacemaker in place - [employer] [id number] [employer] DR MRI   Rectocele   DM (diabetes mellitus) ([place])   History of adenomatous polyp of colon   Bowel obstruction ([place])   Acute focal ischemia of small intestine (HCC)  Subjective:  Looks good.  Alert and oriented.  Had BM.  Her contact is her son, Segen Titus.    Objective:   Vitals:   02/05/19 0652 02/05/19 0709  BP:  (!) 146/86  Pulse:  83  Resp:  (!) 21  Temp: (!) 97.4 F (36.3 C)   SpO2:  99%     Intake/Output from previous day:  12/25 0701 - 12/26 0700 In: 1849.9 [I.V.:1743.1; IV Piggyback:106.8] Out: 1300 [Urine:450; Emesis/NG output:850]  Intake/Output this shift:  No intake/output data recorded.   Physical Exam:   General: WN older WF who is alert and oriented.      HEENT: Normal. Pupils equal. .   Lungs: Clear.   Abdomen: Soft.  Has BS   Wound: Has VAC on  abdomen.   Lab Results:    Recent Labs    02/04/19 0650 02/05/19 0407  WBC 6.6  6.4 5.3  HGB 9.4*  9.2* 10.5*  HCT 28.3*  28.1* 33.7*  PLT 99*  104* 162    BMET   Recent Labs    02/03/19 0900 02/04/19 0203  NA 140 141  K 3.9 4.2  CL 106 109  CO2 25 22  GLUCOSE 95 81  BUN 35* 31*  CREATININE 0.86 0.74  CALCIUM 9.0 8.8*    PT/INR   No results for input(s): LABPROT, INR in the last 72 hours.  ABG  No results for input(s): PHART, HCO3 in the last 72 hours.  Invalid input(s): PCO2, PO2   Studies/Results:  No results found.   Anti-infectives:   Anti-infectives (From admission, onward)   Start     Dose/Rate Route Frequency Ordered Stop   02/02/19 0600  meropenem (MERREM) 1 g in sodium chloride 0.9 % 100 mL IVPB     1 g 200 mL/hr over 30 Minutes Intravenous Every 12 hours 02/01/19 2243 02/04/19 0604   02/01/19 1730  clindamycin (CLEOCIN) IVPB 900 mg     900 mg 100 mL/hr over 30 Minutes Intravenous On call to O.R. 02/01/19 1654 02/01/19 2009   02/01/19 1730  gentamicin (GARAMYCIN) 310 mg  in dextrose 5 % 100 mL IVPB     5 mg/kg  61.2 kg 107.8 mL/hr over 60 Minutes Intravenous On call to O.R. 02/01/19 1654 02/01/19 1957   02/01/19 1545  meropenem (MERREM) 1 g in sodium chloride 0.9 % 100 mL IVPB     1 g 200 mL/hr over 30 Minutes Intravenous STAT 02/01/19 1533 02/01/19 1724      Alphonsa Overall, MD, [facility] Office: [phone] 02/05/2019

## 2019-02-05 NOTE — Progress Notes (Signed)
ANTICOAGULATION CONSULT NOTE - Consult  Pharmacy Consult for IV heparin Indication: atrial fibrillation  Allergies  Allergen Reactions  . Shellfish Allergy Hives and Swelling    Shrimp and lobster only  . Atropine     Causes AFIB  . Codeine     Leg pain  . Doxycycline     unknown  . Fenoprofen Calcium Swelling  . Iodinated Diagnostic Agents Hives and Other (See Comments)  . Isopto Hyoscine [Scopolamine]     Causes AFIB  . Levaquin [Levofloxacin]     Rash & causes AFIB  . Other     Other reaction(s): Bleeding (intolerance) Nutrasweet  Naphon - swelling  . Oxycodone     Depression - pt tolerates as needed   . Penicillins     Has patient had a PCN reaction causing immediate rash, facial/tongue/throat swelling, SOB or lightheadedness with hypotension: unknown Has patient had a PCN reaction causing severe rash involving mucus membranes or skin necrosis: unknown Has patient had a PCN reaction that required hospitalization unknown Has patient had a PCN reaction occurring within the last 10 years: childhood If all of the above answers are "NO", then may proceed with Cephalosporin use. unknown  . Sulfa Drugs Cross Reactors     Ankle swelling   . Warfarin     Other reaction(s): Bleeding (intolerance) From eyes  . Ceclor [Cefaclor] Hives  . Tape Rash    Use paper tape    Patient Measurements: Height: 5\' 4"  (162.6 cm) Weight: 141 lb 8.6 oz (64.2 kg) IBW/kg (Calculated) : 54.7 Heparin Dosing Weight: 61.2  Vital Signs: Temp: 97.4 F (36.3 C) (12/26 0652) Temp Source: Axillary (12/26 0652) BP: 141/79 (12/26 1300) Pulse Rate: 79 (12/26 1100)  Labs: Recent Labs    02/03/19 0900 02/04/19 0203 02/04/19 0650 02/04/19 1718 02/05/19 0407 02/05/19 0415 02/05/19 0841 02/05/19 1248  HGB 10.1*  --  9.4*  9.2*  --  10.5*  --   --   --   HCT 31.4*  --  28.3*  28.1*  --  33.7*  --   --   --   PLT 163  --  99*  104*  --  162  --   --   --   HEPARINUNFRC  --   --   --   0.20*  --  0.37  --  0.19*  CREATININE 0.86 0.74  --   --   --   --  0.62  --     Estimated Creatinine Clearance: 43.6 mL/min (by C-G formula based on SCr of 0.62 mg/dL).  Medications:  Scheduled:  . bisacodyl  10 mg Rectal Daily  . Chlorhexidine Gluconate Cloth  6 each Topical Daily  . lip balm  1 application Topical BID  . mouth rinse  15 mL Mouth Rinse BID  . metoprolol tartrate  5 mg Intravenous Q6H  . neomycin-polymyxin b-dexamethasone  1 application Right Eye QHS  . polyvinyl alcohol   Right Eye BID  . sodium chloride flush  3 mL Intravenous Once    Assessment: Pharmacy is consulted to start heparin IV for 85 yo female with atrial fibrillation.  Pt with recent bowel resection on 12/22. On Xarelto PTA, LD on 12/20. Pt was subsequently changed to prophylactic Lovenox, of which last dose was on 12/24. Surgery requesting no bolus.  Today, 12/26  0415 HL = 0.37 at goal on 1150 units/hr, no bleeding or infusion issues reported by RN.   H/H low but stable, Plts WNL  1248 HL 0.19 subtherapeutic, per RN IV was leaking, heparin off when level drawn  Goal of Therapy:  Heparin level 0.3-0.7 units/ml Monitor platelets by anticoagulation protocol: Yes   Plan:   Continue heparin drip at 1150 units/hr.   no bolus per surgery request due to recent bowel resection procedure  Recheck heparin level at 2000  Daily CBC while on heparin  Monitor for signs and symptoms of bleeding  Dolly Rias RPh 02/05/2019, 2:01 PM

## 2019-02-05 NOTE — Progress Notes (Signed)
Called and spoke with pts son Remo Lipps to let them know pt had been transferred back down to the stepdown unit.

## 2019-02-05 NOTE — Progress Notes (Signed)
ANTICOAGULATION CONSULT NOTE - Consult  Pharmacy Consult for IV heparin Indication: atrial fibrillation  Allergies  Allergen Reactions  . Shellfish Allergy Hives and Swelling    Shrimp and lobster only  . Atropine     Causes AFIB  . Codeine     Leg pain  . Doxycycline     unknown  . Fenoprofen Calcium Swelling  . Iodinated Diagnostic Agents Hives and Other (See Comments)  . Isopto Hyoscine [Scopolamine]     Causes AFIB  . Levaquin [Levofloxacin]     Rash & causes AFIB  . Other     Other reaction(s): Bleeding (intolerance) Nutrasweet  Naphon - swelling  . Oxycodone     Depression - pt tolerates as needed   . Penicillins     Has patient had a PCN reaction causing immediate rash, facial/tongue/throat swelling, SOB or lightheadedness with hypotension: unknown Has patient had a PCN reaction causing severe rash involving mucus membranes or skin necrosis: unknown Has patient had a PCN reaction that required hospitalization unknown Has patient had a PCN reaction occurring within the last 10 years: childhood If all of the above answers are "NO", then may proceed with Cephalosporin use. unknown  . Sulfa Drugs Cross Reactors     Ankle swelling   . Warfarin     Other reaction(s): Bleeding (intolerance) From eyes  . Ceclor [Cefaclor] Hives  . Tape Rash    Use paper tape    Patient Measurements: Height: 5\' 4"  (162.6 cm) Weight: 141 lb 8.6 oz (64.2 kg) IBW/kg (Calculated) : 54.7 Heparin Dosing Weight: 61.2  Vital Signs: Temp: 97.6 F (36.4 C) (12/26 0300) Temp Source: Oral (12/26 0300) BP: 143/84 (12/26 0300) Pulse Rate: 58 (12/26 0300)  Labs: Recent Labs    02/03/19 0900 02/04/19 0203 02/04/19 0650 02/04/19 1718 02/05/19 0415  HGB 10.1*  --  9.4*  9.2*  --   --   HCT 31.4*  --  28.3*  28.1*  --   --   PLT 163  --  99*  104*  --   --   HEPARINUNFRC  --   --   --  0.20* 0.37  CREATININE 0.86 0.74  --   --   --     Estimated Creatinine Clearance: 43.6 mL/min  (by C-G formula based on SCr of 0.74 mg/dL).  Medications:  Scheduled:  . bisacodyl  10 mg Rectal Daily  . Chlorhexidine Gluconate Cloth  6 each Topical Daily  . lip balm  1 application Topical BID  . mouth rinse  15 mL Mouth Rinse BID  . metoprolol tartrate  5 mg Intravenous Q6H  . neomycin-polymyxin b-dexamethasone  1 application Right Eye QHS  . polyvinyl alcohol   Right Eye BID  . sodium chloride flush  3 mL Intravenous Once    Assessment: Pharmacy is consulted to start heparin IV for 85 yo female with atrial fibrillation.  Pt with recent bowel resection on 12/22. On Xarelto PTA, LD on 12/20. Pt was subsequently changed to prophylactic Lovenox, of which last dose was on 12/24. Surgery requesting no bolus.  12/25  Hgb 10.1, plt 163 ( on 12/24)   Scr 0.74 mg/dl, CrCl 44 ml/min  Last INR was on 12/22 (1.5) and PT was 18.1  Heparin level SUBtherapeutic on 950 units/hr Today, 12/26  0415 HL = 0.37 at goal on 1150 units/hr, no bleeding or infusion issues reported by RN.   H/H, plts pending  Goal of Therapy:  Heparin level 0.3-0.7 units/ml  Monitor platelets by anticoagulation protocol: Yes   Plan:   Continue heparin drip at 1150 units/hr.   no bolus per surgery request due to recent bowel resection procedure  Recheck confirmatory  heparin level today.   Daily CBC while on heparin  Monitor for signs and symptoms of bleeding  Dorrene German [id number] 02/05/2019, 5:04 AM

## 2019-02-05 NOTE — Progress Notes (Signed)
Progress Note  Patient Name: Megan Hester Date of Encounter: 02/05/2019  Primary Cardiologist: No primary care provider on file. [place]  Subjective   Feels better, no palps.   Inpatient Medications    Scheduled Meds: . bisacodyl  10 mg Rectal Daily  . Chlorhexidine Gluconate Cloth  6 each Topical Daily  . lip balm  1 application Topical BID  . mouth rinse  15 mL Mouth Rinse BID  . metoprolol tartrate  5 mg Intravenous Q6H  . neomycin-polymyxin b-dexamethasone  1 application Right Eye QHS  . polyvinyl alcohol   Right Eye BID  . sodium chloride flush  3 mL Intravenous Once   Continuous Infusions: . amiodarone 30 mg/hr (02/05/19 0733)  . famotidine (PEPCID) IV Stopped (02/04/19 2235)  . heparin 1,150 Units/hr (02/05/19 0733)  . lactated ringers Stopped (02/04/19 2159)  . methocarbamol (ROBAXIN) IV    . ondansetron (ZOFRAN) IV     PRN Meds: alum & mag hydroxide-simeth, clobetasol cream, diphenhydrAMINE, enalaprilat, haloperidol lactate, labetalol, magic mouthwash, menthol-cetylpyridinium, methocarbamol (ROBAXIN) IV, morphine injection, naphazoline-glycerin, OLANZapine, ondansetron (ZOFRAN) IV **OR** ondansetron (ZOFRAN) IV, phenol, prochlorperazine, simethicone   Vital Signs    Vitals:   02/05/19 0600 02/05/19 0640 02/05/19 0652 02/05/19 0709  BP: (!) 143/62 (!) 113/56  (!) 146/86  Pulse: 70 75  83  Resp: 17 15  (!) 21  Temp:   (!) 97.4 F (36.3 C)   TempSrc:   Axillary   SpO2: 96% 97%  99%  Weight:      Height:        Intake/Output Summary (Last 24 hours) at 02/05/2019 0854 Last data filed at 02/05/2019 0733 Gross per 24 hour  Intake 1728.46 ml  Output 1500 ml  Net 228.46 ml   Last 3 Weights 02/04/2019 02/03/2019 02/02/2019  Weight (lbs) 141 lb 8.6 oz 141 lb 8.6 oz 144 lb 10 oz  Weight (kg) 64.2 kg 64.2 kg 65.6 kg      Telemetry    AFIB 70's. Occasional paced beat - Personally Reviewed  ECG    02/03/2019-right bundle branch block 104 atrial  fibrillation- Personally Reviewed  Physical Exam   GEN: No acute distress.  Elderly, right eye closed. Neck: No JVD Cardiac: IRRR, no murmurs, rubs, or gallops.  Respiratory: Clear to auscultation bilaterally. GI: Soft, nontender, non-distended  MS: No edema; No deformity. Neuro:  Nonfocal  Psych: Normal affect   Labs    High Sensitivity Troponin:  No results for input(s): TROPONINIHS in the last 720 hours.    Chemistry Recent Labs  Lab 02/01/19 1056 02/02/19 0157 02/03/19 0900 02/04/19 0203  NA 138 138 140 141  K 4.4 4.6 3.9 4.2  CL 99 108 106 109  CO2 22 20* 25 22  GLUCOSE 211* 160* 95 81  BUN 27* 33* 35* 31*  CREATININE 1.40* 1.32* 0.86 0.74  CALCIUM 10.1 8.6* 9.0 8.8*  PROT 7.1 5.6* 5.6*  --   ALBUMIN 4.0 3.0* 2.8*  --   AST 24 20 23   --   ALT 16 11 13   --   ALKPHOS 146* 98 96  --   BILITOT 1.7* 1.1 1.0  --   GFRNONAA 34* 36* >60 >60  GFRAA 39* 42* >60 >60  ANIONGAP 17* 10 9 10      Hematology Recent Labs  Lab 02/03/19 0900 02/04/19 0650 02/05/19 0407  WBC 10.0 6.6  6.4 5.3  RBC 2.75* 2.58*  2.51* 2.93*  HGB 10.1* 9.4*  9.2* 10.5*  HCT 31.4* 28.3*  28.1* 33.7*  MCV 114.2* 109.7*  112.0* 115.0*  MCH 36.7* 36.4*  36.7* 35.8*  MCHC 32.2 33.2  32.7 31.2  RDW 15.3 15.2  15.1 14.9  PLT 163 99*  104* 162    BNPNo results for input(s): BNP, PROBNP in the last 168 hours.   DDimer No results for input(s): DDIMER in the last 168 hours.   Radiology    No results found.  Cardiac Studies   2016 echocardiogram EF 60 with aortic root 40 mm  Patient Profile     85 y.o. female postop laparoscopic diagnostic lysis of adhesions and small bowel resection with paroxysmal atrial fibrillation  Assessment & Plan    Paroxysmal atrial fibrillation -On IV amiodarone currently.  When able to take p.o., tomorrow for instance, lets place her back on her home flecainide. -Anticoagulation currently with IV heparin, place her back on Xarelto tomorrow if able  to advance diet per surgery. -Her overall burden of atrial fibrillation was 13.8%, prior to that 45% after interrogating pacemaker.  At this time, she was not interested in any further antiarrhythmic therapy.  If burden increases, may need to pursue rate control strategy only and stop flecainide.  This can be handled as outpatient.  Reviewed [place] note.  Sick sinus syndrome Pacemaker for backup  Essential hypertension -Stable  Small bowel resection lysis of adhesions -Surgery notes reviewed.  AKI  - improved. Labs reviewed.   For questions or updates, please contact [place] Please consult [email or web address] for contact info under        Signed, Candee Furbish, MD  02/05/2019, 8:54 AM

## 2019-02-05 NOTE — Progress Notes (Addendum)
PROGRESS NOTE    Megan Hester  [id number] DOB: November 22, 1930 DOA: 02/01/2019 PCP: [name], MD   Brief Narrative: Patient is an 85 year old Caucasian female with a past medical history significant for but not limited to sick sinus syndrome status post permanent pacemaker, atrial fibrillation on chronic anticoagulation with Xarelto, hypertension, GERD as well as other comorbidities who presented to the ED with 1 day of abdominal discomfort as well as nausea and vomiting.  In the ED she was noted to be hypotensive and was given fluid resuscitation and blood pressure improved.  Creatinine was elevated and she had a CT scan that was noted for closed-loop obstruction in the lower abdomen with possible early ischemia.  She underwent surgical intervention with a laparoscopic and diagnostic lysis of adhesions overall laparoscopic small bowel resection and application of the wound VAC.  Postoperatively she had some delirium which is now improved.  Subsequently her heart rate went into atrial fibrillation with RVR and cardiology was consulted and she is in place on amiodarone drip as well as a heparin drip.  She was n.p.o. and still had an NG tube in but General Surgery removed NGT this AM and placed the patient on Liquids from the floor.  General surgery if she tolerates liquids today she can have a diet tomorrow and may transition to oral anticoagulation in the AM.  For now we will continue her on the amiodarone and heparin drip and cardiology states when she is able to take p.o. tomorrow (to place her back on flecainide as well as Xarelto.  Assessment & Plan:   Principal Problem:   Closed loop SBO (small bowel obstruction)  Active Problems:   A-fib (HCC)   Hypertension   Sick sinus syndrome (HCC)   Sepsis (HCC)   Chronic anticoagulation   Cardiac pacemaker in place - [employer]onic [id number] Advisa DR MRI   Rectocele   DM (diabetes mellitus) ([place])   History of adenomatous polyp of colon    Bowel obstruction (HCC)   Acute focal ischemia of small intestine (HCC)  Closed SBO s/p LAPAROSCOPY DIAGNOSTIC, LYSIS OF ADHESION, LAPAROSCOPIC SMALL BOWEL RESECTION, APPLICATION OF WOUND VAC, POD 4 -Appreciate Surgical evaluation and Assistance  -C/w IV Meropenem for now -Initially was NPO; now NG tube is removed this morning by general surgery and she has been put on a liquid diet from the floor and if she does well with liquids today per general surgery they can start oral anticoagulation tomorrow and start a diet tomorrow -Ambulate  -She has a wound VAC in place and appreciate wound nurse evaluation; patient is to have her wound VAC changed every Monday Wednesday and Friday -Continue all medications to IV if possible given that she is n.p.o.  -Pain control per general surgery and she is on morphine 1 mg every 4 hours as needed severe pain along with methocarbamol 1000 g IV every 6 hours as needed muscle spasms -Continue with antiemetics with Compazine IV every 4 hours as needed for refractory nausea vomiting as well as ondansetron 4 mg po/IV every 6 hours as needed nausea vomiting or any   HTN but became hypotensive in the setting of her RVR  -Fluid boluses were ordered yesterday followed by maintenance dose to be continued with LR but will reduce rate to 50 mL/h from 25 MLS per hour -Monitor closely -Hold antihypertensive medications.  Continue with Lopressor with parameter. -Consulted cardiology and they recommend holding her p.o. medications as she is still n.p.o. and resuming them once she  is able; Dr. Marlou Porch is to place her back on the flecainide tomorrow if she is able to tolerate p.o. -She is on amiodarone drip and this will be continued -Blood pressure has remained stable and was 142/97 this a.m. -Patient has Enalaprilat 0.625-1.25 mg IV every 6 hours as needed for systolic blood pressure greater than [id number] or diastolic blood pressure greater than 100, as well as labetalol 5 mg IV every  2 as needed for heart rate greater than 120  -Also has metoprolol tartrate IV 5 mg every 6 hours scheduled -May be able to change to oral antihypertensives once he tolerates a diet more and likely in the AM -Bowel regimen per general surgery -Diet advancement per general surgery and she is to be given liquids from the floor today and that will start the diet tomorrow -Continue to monitor and follow clinical response to intervention  Paroxysmal Afib and SSS s/p PPM -Followed by Dr. Rayann Heman in the outpatient setting  -Patient was rate controlled but went into a burst of RVR few days ago and is now improved -Pt had been on Xarelto prior to admit, hold prior to surgery on 02/01/2019. Resuming anticoagulation as ok with General Surgery but recommending a Heparin gtt as she is NPO except Ice Chips as she has a NGT in Place  -Home medication Cardizem, flecainide and Lopressor on hold as patient is unable to have p.o. may resume when allowed to have p.o. diet -Consulted Cardiology on 02/03/2019. Started amio gtt and she is on metoprolol tartrate 5 mg IV every 6 hours scheduled -Will follow cardiology recommendations and appreciate further assistance -Now on a heparin drip for anticoagulation and heart rates have been in 70s 80s with A. Fib; cardiology recommends continuing amiodarone GTT while she is n.p.o. and transition to p.o. meds once able to take p.o  Sepsis from SBO/?Intraabdominal Infection, improved -Resolved as sepsis physiology is improved -Presented with leukocytosis with tachypnea; WBC was 24.6 and now improved to 5.3 -Likely secondary to presenting obstruction -Negative Blood Cx x2 at 4 Days and UA as well as CXR so far -Will cont current broad spec abx Meropenem as started in ED given pt's numerous allergies -May de-escalate in the AM but that will be in regards to Clinical Response -Repeat CBC in AM   AKI: Slowly improving Metabolic Acidosis  -Likely secondary to  dehydration -Patient's BUN/Cr went from 33/1.32 -> 35/0.86 -> 31/0.74 -> 28/0.62 -Patient's CO2 is now 22, Chloride is 109, and AG is 10  -Will continue with IVF as tolerated reduced rate to 50 MLS per hour -Repeat bmet in AM  Hypokalemia -Patient potassium is now 3.4 -Replete with IV K-Phos 20 mmol -Continue monitor replete as necessary  -Repeat CMP in a.m.  Hypophosphatemia -Patient's Phos Level was 2.3 -Replete with IV K Phos 20 mmol -Continue to Monitor and Replete as Necessary -Repeat Phos Level in AM   Agitation and Confusion, improved significantly -Appeared to be having ICU delirium -Needed four-point restraints initially but now removed -Added Olanzapine and Haldol IM as needed for extreme agitation -Frequent reorientation and family member or family of face to visit the patient -Avoid benzodiazepine for this age group  Macrocytic Anemia -Patient's Hgb/Hct Went from 13.4/41.4 -> 10.1/31.4 -> 10.5/33.7 -Check Anemia Panel in the AM -Continue to Monitor for S/Sx of Bleeding; Currently no overt bleeding noted -Repeat CBC in AM   Hx of Right Breast Cancer -Follow up with Oncology in the outpatient setting   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 -Continue with Maxitrol 1 application the right eye nightly along with the following-glycerin 1 to 2 drops both eyes 4 times a day as needed for eye irritation as well as polyvinyl alcohol 1.4% ophthalmic solution right eye twice daily  DVT prophylaxis: Anticoagulated with Heparin gtt Code Status: FULL CODE  Family Communication: No family present at bedside  Disposition Plan: Pending Surgical Clearance but for now Transfer to Telemetry and out of SDU  Consultants:   General Surgery  Cardiology    Procedures:  LAPAROSCOPY DIAGNOSTIC WITH LYSIS OF ADHESION SMALL BOWEL RESECTION TAP BLOCK PRIMARY UMBILICAL HERNIA REPAIR APPLICATION OF WOUND VAC -Done by Dr. [name] on 02/01/2019     Antimicrobials:  Anti-infectives (From admission, onward)   Start     Dose/Rate Route Frequency Ordered Stop   02/02/19 0600  meropenem (MERREM) 1 g in sodium chloride 0.9 % 100 mL IVPB     1 g 200 mL/hr over 30 Minutes Intravenous Every 12 hours 02/01/19 2243 02/04/19 0604   02/01/19 1730  clindamycin (CLEOCIN) IVPB 900 mg     900 mg 100 mL/hr over 30 Minutes Intravenous On call to O.R. 02/01/19 1654 02/01/19 2009   02/01/19 1730  gentamicin (GARAMYCIN) 310 mg in dextrose 5 % 100 mL IVPB     5 mg/kg  61.2 kg 107.8 mL/hr over 60 Minutes Intravenous On call to O.R. 02/01/19 1654 02/01/19 1957   02/01/19 1545  meropenem (MERREM) 1 g in sodium chloride 0.9 % 100 mL IVPB     1 g 200 mL/hr over 30 Minutes Intravenous STAT 02/01/19 1533 02/01/19 1724     Subjective: Seen and examined in bedside and she is much more awake and alert and in NAD.  Happy that her NG tube was removed and states that she had a sip of ginger ale and she enjoyed that.  No nausea or vomiting.  Still some abdominal tenderness and soreness.  No other concerns or complaints this time  Objective: Vitals:   02/05/19 0600 02/05/19 0640 02/05/19 0652 02/05/19 0709  BP: (!) 143/62 (!) 113/56  (!) 146/86  Pulse: 70 75  83  Resp: 17 15  (!) 21  Temp:   (!) 97.4 F (36.3 C)   TempSrc:   Axillary   SpO2: 96% 97%  99%  Weight:      Height:        Intake/Output Summary (Last 24 hours) at 02/05/2019 [id number] Last data filed at 02/05/2019 [id number] Gross per 24 hour  Intake 1910.48 ml  Output 1600 ml  Net 310.48 ml   Filed Weights   02/02/19 0500 02/03/19 0500 02/04/19 0405  Weight: 65.6 kg 64.2 kg 64.2 kg   Examination: Physical Exam:  Constitutional: WN/WD elderly Caucasian female in NAD and appears calm and comfortable Eyes: Has a Right Eye Enucleation and blindness ENMT: External Ears, Nose appear normal. Grossly normal hearing.  Neck: Appears normal, supple, no cervical masses, normal ROM, no appreciable  thyromegaly; no JVD Respiratory: Diminished to auscultation bilaterally, no wheezing, rales, rhonchi or crackles. Normal respiratory effort and patient is not tachypenic. No accessory muscle use.  Cardiovascular: Irregularly Irregular, no murmurs / rubs / gallops. S1 and S2 auscultated. Trace extremity edema. Abdomen: Soft, non-tender, Distended. Has a Midline Abdominal incisional wound with a Wound Vac in Place Bowel sounds positive but are a little diminished .  GU: Deferred. Musculoskeletal: No clubbing / cyanosis of digits/nails. No joint deformity upper and lower extremities.   Skin: No  rashes, lesions, ulcers on a limited skin evaluation. No induration; Warm and dry.  Neurologic: CN 2-12 grossly intact with no focal deficits. Romberg sign and cerebellar reflexes not assessed.  Psychiatric: Normal judgment and insight. Alert and oriented x 3. Normal mood and appropriate affect.   Data Reviewed: I have personally reviewed following labs and imaging studies  CBC: Recent Labs  Lab 02/01/19 1056 02/02/19 0157 02/03/19 0900 02/04/19 0650 02/05/19 0407  WBC 24.6* 8.8 10.0 6.6  6.4 5.3  NEUTROABS  --   --  8.4* 5.0  4.9  --   HGB 14.0 13.4 10.1* 9.4*  9.2* 10.5*  HCT 42.7 41.4 31.4* 28.3*  28.1* 33.7*  MCV 111.5* 110.1* 114.2* 109.7*  112.0* 115.0*  PLT 358 185 163 99*  104* 0000000   Basic Metabolic Panel: Recent Labs  Lab 02/01/19 1056 02/02/19 0157 02/03/19 0900 02/04/19 0203  NA 138 138 140 141  K 4.4 4.6 3.9 4.2  CL 99 108 106 109  CO2 22 20* 25 22  GLUCOSE 211* 160* 95 81  BUN 27* 33* 35* 31*  CREATININE 1.40* 1.32* 0.86 0.74  CALCIUM 10.1 8.6* 9.0 8.8*  MG  --  2.1  --  2.2   GFR: Estimated Creatinine Clearance: 43.6 mL/min (by C-G formula based on SCr of 0.74 mg/dL). Liver Function Tests: Recent Labs  Lab 02/01/19 1056 02/02/19 0157 02/03/19 0900  AST 24 20 23   ALT 16 11 13   ALKPHOS 146* 98 96  BILITOT 1.7* 1.1 1.0  PROT 7.1 5.6* 5.6*  ALBUMIN 4.0 3.0*  2.8*   Recent Labs  Lab 02/01/19 1056  LIPASE 26   No results for input(s): AMMONIA in the last 168 hours. Coagulation Profile: Recent Labs  Lab 02/01/19 1638  INR 1.5*   Cardiac Enzymes: No results for input(s): CKTOTAL, CKMB, CKMBINDEX, TROPONINI in the last 168 hours. BNP (last 3 results) No results for input(s): PROBNP in the last 8760 hours. HbA1C: No results for input(s): HGBA1C in the last 72 hours. CBG: No results for input(s): GLUCAP in the last 168 hours. Lipid Profile: No results for input(s): CHOL, HDL, LDLCALC, TRIG, CHOLHDL, LDLDIRECT in the last 72 hours. Thyroid Function Tests: No results for input(s): TSH, T4TOTAL, FREET4, T3FREE, THYROIDAB in the last 72 hours. Anemia Panel: No results for input(s): VITAMINB12, FOLATE, FERRITIN, TIBC, IRON, RETICCTPCT in the last 72 hours. Sepsis Labs: Recent Labs  Lab 02/01/19 1638  LATICACIDVEN 2.9*    Recent Results (from the past 240 hour(s))  Culture, blood (routine x 2)     Status: None (Preliminary result)   Collection Time: 02/01/19  4:35 PM   Specimen: BLOOD  Result Value Ref Range Status   Specimen Description   Final    BLOOD RIGHT WRIST Performed at [facility] [address], [place], [place] [address]    Special Requests   Final    BOTTLES DRAWN AEROBIC AND ANAEROBIC Blood Culture adequate volume Performed at [facility] [address], [place], [place] [address]    Culture   Final    NO GROWTH 4 DAYS Performed at [facility], [place] [address] [place], [place] [address]    Report Status PENDING  Incomplete  Respiratory Panel by RT PCR (Flu A&B, Covid) - Nasopharyngeal Swab     Status: None   Collection Time: 02/01/19  4:38 PM   Specimen: Nasopharyngeal Swab  Result Value Ref Range Status   SARS Coronavirus 2 by RT  PCR NEGATIVE NEGATIVE Final    Comment: (NOTE) SARS-CoV-2 target nucleic acids are NOT DETECTED. The SARS-CoV-2 RNA is generally  detectable in upper respiratoy specimens during the acute phase of infection. The lowest concentration of SARS-CoV-2 viral copies this assay can detect is 131 copies/mL. A negative result does not preclude SARS-Cov-2 infection and should not be used as the sole basis for treatment or other patient management decisions. A negative result may occur with  improper specimen collection/handling, submission of specimen other than nasopharyngeal swab, presence of viral mutation(s) within the areas targeted by this assay, and inadequate number of viral copies (<131 copies/mL). A negative result must be combined with clinical observations, patient history, and epidemiological information. The expected result is Negative. Fact Sheet for Patients:  PinkCheek.be Fact Sheet for Healthcare Providers:  GravelBags.it This test is not yet ap proved or cleared by the [place] FDA and  has been authorized for detection and/or diagnosis of SARS-CoV-2 by FDA under an Emergency Use Authorization (EUA). This EUA will remain  in effect (meaning this test can be used) for the duration of the COVID-19 declaration under Section 564(b)(1) of the Act, 21 U.S.C. section 360bbb-3(b)(1), unless the authorization is terminated or revoked sooner.    Influenza A by PCR NEGATIVE NEGATIVE Final   Influenza B by PCR NEGATIVE NEGATIVE Final    Comment: (NOTE) The Xpert Xpress SARS-CoV-2/FLU/RSV assay is intended as an aid in  the diagnosis of influenza from Nasopharyngeal swab specimens and  should not be used as a sole basis for treatment. Nasal washings and  aspirates are unacceptable for Xpert Xpress SARS-CoV-2/FLU/RSV  testing. Fact Sheet for Patients: PinkCheek.be Fact Sheet for Healthcare Providers: GravelBags.it This test is not yet approved or cleared by the [place] FDA and  has been  authorized for detection and/or diagnosis of SARS-CoV-2 by  FDA under an Emergency Use Authorization (EUA). This EUA will remain  in effect (meaning this test can be used) for the duration of the  Covid-19 declaration under Section 564(b)(1) of the Act, 21  U.S.C. section 360bbb-3(b)(1), unless the authorization is  terminated or revoked. Performed at [facility], [place] [address], [place], [place] [address]   Culture, blood (routine x 2)     Status: None (Preliminary result)   Collection Time: 02/01/19  4:51 PM   Specimen: BLOOD  Result Value Ref Range Status   Specimen Description   Final    BLOOD RIGHT ANTECUBITAL Performed at [facility] [address], [place], [place] [address]    Special Requests   Final    BOTTLES DRAWN AEROBIC AND ANAEROBIC Blood Culture adequate volume Performed at [facility] [address], [place], [place] [address]    Culture   Final    NO GROWTH 4 DAYS Performed at [facility], [place] [address],  [place], [place] [address]    Report Status PENDING  Incomplete  MRSA PCR Screening     Status: None   Collection Time: 02/01/19 10:49 PM   Specimen: Nasal Mucosa; Nasopharyngeal  Result Value Ref Range Status   MRSA by PCR NEGATIVE NEGATIVE Final    Comment:        The GeneXpert MRSA Assay (FDA approved for NASAL specimens only), is one component of a comprehensive MRSA colonization surveillance program. It is not intended to diagnose MRSA infection nor to guide or monitor treatment for MRSA infections. Performed at [facility], [address] [place],  [place] [address]    Radiology Studies: No results found.  Scheduled Meds: . bisacodyl  10 mg Rectal Daily  . Chlorhexidine Gluconate Cloth  6 each Topical Daily  . lip balm  1 application Topical BID  . mouth rinse  15 mL Mouth Rinse BID  . metoprolol tartrate  5 mg Intravenous Q6H  . neomycin-polymyxin  b-dexamethasone  1 application Right Eye QHS  . polyvinyl alcohol   Right Eye BID  . sodium chloride flush  3 mL Intravenous Once   Continuous Infusions: . amiodarone 30 mg/hr (02/05/19 0733)  . famotidine (PEPCID) IV Stopped (02/04/19 2235)  . heparin 1,150 Units/hr (02/05/19 0733)  . lactated ringers Stopped (02/04/19 2159)  . methocarbamol (ROBAXIN) IV    . ondansetron (ZOFRAN) IV      LOS: 4 days   Kerney Elbe, DO [facility] PAGER is on [place]  If 7PM-7AM, please contact night-coverage [email or web address]

## 2019-02-05 NOTE — Progress Notes (Signed)
Patient to transfer to [facility] report given to receiving nurse Beth, all questions answered at this time.  Pt. VSS with no s/s of distress noted.  Patient stable for transfer.

## 2019-02-05 NOTE — Progress Notes (Signed)
Patient arrived to 1302 via wheelchair. Alert and without signs of distress.Donne Hazel, RN

## 2019-02-06 LAB — COMPREHENSIVE METABOLIC PANEL
ALT: 17 U/L (ref 0–44)
AST: 23 U/L (ref 15–41)
Albumin: 2.5 g/dL — ABNORMAL LOW (ref 3.5–5.0)
Alkaline Phosphatase: 90 U/L (ref 38–126)
Anion gap: 9 (ref 5–15)
BUN: 19 mg/dL (ref 8–23)
CO2: 24 mmol/L (ref 22–32)
Calcium: 8.2 mg/dL — ABNORMAL LOW (ref 8.9–10.3)
Chloride: 106 mmol/L (ref 98–111)
Creatinine, Ser: 0.63 mg/dL (ref 0.44–1.00)
GFR calc Af Amer: >60 mL/min (ref >60)
GFR calc non Af Amer: >60 mL/min (ref >60)
Glucose, Bld: 169 mg/dL — ABNORMAL HIGH (ref 70–99)
Potassium: 3 mmol/L — ABNORMAL LOW (ref 3.5–5.1)
Sodium: 139 mmol/L (ref 135–145)
Total Bilirubin: 1 mg/dL (ref 0.3–1.2)
Total Protein: 5 g/dL — ABNORMAL LOW (ref 6.5–8.1)

## 2019-02-06 LAB — CBC WITH DIFFERENTIAL/PLATELET
Abs Immature Granulocytes: 0.03 10*3/uL (ref 0.00–0.07)
Basophils Absolute: 0 10*3/uL (ref 0.0–0.1)
Basophils Relative: 0 %
Eosinophils Absolute: 0.2 10*3/uL (ref 0.0–0.5)
Eosinophils Relative: 3 %
HCT: 28.2 % — ABNORMAL LOW (ref 36.0–46.0)
Hemoglobin: 9.1 g/dL — ABNORMAL LOW (ref 12.0–15.0)
Immature Granulocytes: 1 %
Lymphocytes Relative: 21 %
Lymphs Abs: 1.4 10*3/uL (ref 0.7–4.0)
MCH: 35.5 pg — ABNORMAL HIGH (ref 26.0–34.0)
MCHC: 32.3 g/dL (ref 30.0–36.0)
MCV: 110.2 fL — ABNORMAL HIGH (ref 80.0–100.0)
Monocytes Absolute: 0.5 10*3/uL (ref 0.1–1.0)
Monocytes Relative: 8 %
Neutro Abs: 4.4 10*3/uL (ref 1.7–7.7)
Neutrophils Relative %: 67 %
Platelets: 181 10*3/uL (ref 150–400)
RBC: 2.56 MIL/uL — ABNORMAL LOW (ref 3.87–5.11)
RDW: 14.7 % (ref 11.5–15.5)
WBC: 6.5 10*3/uL (ref 4.0–10.5)
nRBC: 0 % (ref 0.0–0.2)

## 2019-02-06 LAB — IRON AND TIBC
Iron: 51 ug/dL (ref 28–170)
Saturation Ratios: 34 % — ABNORMAL HIGH (ref 10.4–31.8)
TIBC: 151 ug/dL — ABNORMAL LOW (ref 250–450)
UIBC: 100 ug/dL

## 2019-02-06 LAB — RETICULOCYTES
Immature Retic Fract: 11.7 % (ref 2.3–15.9)
RBC.: 2.55 MIL/uL — ABNORMAL LOW (ref 3.87–5.11)
Retic Count, Absolute: 24.2 10*3/uL (ref 19.0–186.0)
Retic Ct Pct: 1 % (ref 0.4–3.1)

## 2019-02-06 LAB — CULTURE, BLOOD (ROUTINE X 2)
Culture: NO GROWTH
Culture: NO GROWTH
Special Requests: ADEQUATE
Special Requests: ADEQUATE

## 2019-02-06 LAB — FOLATE: Folate: 16.5 ng/mL (ref >5.9)

## 2019-02-06 LAB — VITAMIN B12: Vitamin B-12: 813 pg/mL (ref 180–914)

## 2019-02-06 LAB — PHOSPHORUS: Phosphorus: 3 mg/dL (ref 2.5–4.6)

## 2019-02-06 LAB — FERRITIN: Ferritin: 453 ng/mL — ABNORMAL HIGH (ref 11–307)

## 2019-02-06 LAB — HEPARIN LEVEL (UNFRACTIONATED): Heparin Unfractionated: 0.44 IU/mL (ref 0.30–0.70)

## 2019-02-06 LAB — MAGNESIUM: Magnesium: 1.8 mg/dL (ref 1.7–2.4)

## 2019-02-06 MED ORDER — METOPROLOL TARTRATE 5 MG/5ML IV SOLN
5.0000 mg | Freq: Once | INTRAVENOUS | Status: AC
  Administered 2019-02-06: 14:00:00 5 mg via INTRAVENOUS
  Filled 2019-02-06: qty 5

## 2019-02-06 MED ORDER — MAGNESIUM SULFATE IN D5W 1-5 GM/100ML-% IV SOLN
1.0000 g | Freq: Once | INTRAVENOUS | Status: AC
  Administered 2019-02-06: 1 g via INTRAVENOUS
  Filled 2019-02-06: qty 100

## 2019-02-06 MED ORDER — METOPROLOL TARTRATE 5 MG/5ML IV SOLN
INTRAVENOUS | Status: AC
  Filled 2019-02-06: qty 5

## 2019-02-06 MED ORDER — POTASSIUM CHLORIDE 10 MEQ/100ML IV SOLN
10.0000 meq | INTRAVENOUS | Status: AC
  Administered 2019-02-06 (×6): 10 meq via INTRAVENOUS
  Filled 2019-02-06 (×4): qty 100

## 2019-02-06 MED ORDER — RIVAROXABAN 20 MG PO TABS
20.0000 mg | ORAL_TABLET | Freq: Every day | ORAL | Status: DC
  Administered 2019-02-06 – 2019-02-08 (×3): 20 mg via ORAL
  Filled 2019-02-06 (×3): qty 1

## 2019-02-06 MED ORDER — METOPROLOL TARTRATE 5 MG/5ML IV SOLN
5.0000 mg | INTRAVENOUS | Status: DC | PRN
  Administered 2019-02-06: 5 mg via INTRAVENOUS

## 2019-02-06 MED ORDER — METOPROLOL TARTRATE 25 MG PO TABS
25.0000 mg | ORAL_TABLET | Freq: Four times a day (QID) | ORAL | Status: DC
  Administered 2019-02-06 (×3): 25 mg via ORAL
  Filled 2019-02-06 (×4): qty 1

## 2019-02-06 MED ORDER — FLECAINIDE ACETATE 100 MG PO TABS
100.0000 mg | ORAL_TABLET | Freq: Two times a day (BID) | ORAL | Status: DC
  Administered 2019-02-06: 100 mg via ORAL
  Filled 2019-02-06: qty 1

## 2019-02-06 MED ORDER — AMIODARONE HCL 200 MG PO TABS: 200.0000 mg | ORAL_TABLET | Freq: Every day | ORAL | Status: DC

## 2019-02-06 NOTE — Progress Notes (Signed)
Patient very confused thinks she is at home and someone is under the bed. She states "maybe a dog?". I reoriented her to hospital and assured her we don't have any dogs.She has attempted to get out of bed multiple times.

## 2019-02-06 NOTE — Plan of Care (Signed)
  Problem: Education: Goal: Knowledge of General Education information will improve Description Including pain rating scale, medication(s)/side effects and non-pharmacologic comfort measures Outcome: Progressing   

## 2019-02-06 NOTE — Progress Notes (Addendum)
Progress Note  Patient Name: Megan Hester Date of Encounter: 02/06/2019  Primary Cardiologist: No primary care provider on file. [place]  Subjective   Confusion overnight. No CP.   Inpatient Medications    Scheduled Meds: . bisacodyl  10 mg Rectal Daily  . Chlorhexidine Gluconate Cloth  6 each Topical Daily  . lip balm  1 application Topical BID  . mouth rinse  15 mL Mouth Rinse BID  . metoprolol tartrate  5 mg Intravenous Q6H  . neomycin-polymyxin b-dexamethasone  1 application Right Eye QHS  . polyvinyl alcohol   Right Eye BID  . rivaroxaban  20 mg Oral Q supper  . sodium chloride flush  10-40 mL Intracatheter Q12H  . sodium chloride flush  3 mL Intravenous Once   Continuous Infusions: . famotidine (PEPCID) IV Stopped (02/05/19 2323)  . lactated ringers 50 mL/hr at 02/05/19 1205  . magnesium sulfate bolus IVPB 100 mL/hr at 02/06/19 0915  . methocarbamol (ROBAXIN) IV    . ondansetron (ZOFRAN) IV    . potassium chloride 100 mL/hr at 02/06/19 0915   PRN Meds: alum & mag hydroxide-simeth, clobetasol cream, diphenhydrAMINE, enalaprilat, haloperidol lactate, labetalol, magic mouthwash, menthol-cetylpyridinium, methocarbamol (ROBAXIN) IV, morphine injection, naphazoline-glycerin, OLANZapine, ondansetron (ZOFRAN) IV **OR** ondansetron (ZOFRAN) IV, phenol, prochlorperazine, simethicone, sodium chloride flush   Vital Signs    Vitals:   02/06/19 0700 02/06/19 0800 02/06/19 0900 02/06/19 0901  BP: (!) 148/114 (!) 170/97 136/83   Pulse: 91  (!) 124 (!) 108  Resp: 19 (!) 21 19 (!) 24  Temp:    98.1 F (36.7 C)  TempSrc:    Oral  SpO2: 99%  99% 100%  Weight:      Height:        Intake/Output Summary (Last 24 hours) at 02/06/2019 0926 Last data filed at 02/06/2019 0915 Gross per 24 hour  Intake 1783.92 ml  Output 875 ml  Net 908.92 ml   Last 3 Weights 02/06/2019 02/04/2019 02/03/2019  Weight (lbs) 144 lb 2.9 oz 141 lb 8.6 oz 141 lb 8.6 oz  Weight (kg) 65.4 kg  64.2 kg 64.2 kg      Telemetry    AFIB 70's- 100, no change. Occasional paced beat - Personally Reviewed  ECG    02/03/2019-right bundle branch block 104 atrial fibrillation- Personally Reviewed  Physical Exam   GEN: Elderly, well developed, in no acute distress  HEENT: normal  Neck: no JVD, carotid bruits, or masses Cardiac: IRRR; no murmurs, rubs, or gallops,no edema  Respiratory:  clear to auscultation bilaterally, normal work of breathing GI: soft, nontender, nondistended, + BS MS: no deformity or atrophy  Skin: warm and dry, no rash Neuro:  Alert and Oriented x 3, Strength and sensation are intact Psych: euthymic mood, full affect   Labs    High Sensitivity Troponin:  No results for input(s): TROPONINIHS in the last 720 hours.    Chemistry Recent Labs  Lab 02/03/19 0900 02/04/19 0203 02/05/19 0841 02/06/19 0214  NA 140 141 144 139  K 3.9 4.2 3.4* 3.0*  CL 106 109 109 106  CO2 25 22 25 24   GLUCOSE 95 81 87 169*  BUN 35* 31* 28* 19  CREATININE 0.86 0.74 0.62 0.63  CALCIUM 9.0 8.8* 8.7* 8.2*  PROT 5.6*  --  5.2* 5.0*  ALBUMIN 2.8*  --  2.6* 2.5*  AST 23  --  21 23  ALT 13  --  15 17  ALKPHOS 96  --  93 90  BILITOT 1.0  --  0.8 1.0  GFRNONAA >60 >60 >60 >60  GFRAA >60 >60 >60 >60  ANIONGAP 9 10 10 9      Hematology Recent Labs  Lab 02/04/19 0650 02/05/19 0407 02/06/19 0214  WBC 6.6  6.4 5.3 6.5  RBC 2.58*  2.51* 2.93* 2.56*  2.55*  HGB 9.4*  9.2* 10.5* 9.1*  HCT 28.3*  28.1* 33.7* 28.2*  MCV 109.7*  112.0* 115.0* 110.2*  MCH 36.4*  36.7* 35.8* 35.5*  MCHC 33.2  32.7 31.2 32.3  RDW 15.2  15.1 14.9 14.7  PLT 99*  104* 162 181    BNPNo results for input(s): BNP, PROBNP in the last 168 hours.   DDimer No results for input(s): DDIMER in the last 168 hours.   Radiology    No results found.  Cardiac Studies   2016 echocardiogram EF 60 with aortic root 40 mm  Patient Profile     85 y.o. female postop laparoscopic diagnostic lysis  of adhesions and small bowel resection with paroxysmal atrial fibrillation  Assessment & Plan    Paroxysmal atrial fibrillation -Stop IV amiodarone.  Since she received IV amiodarone from 12/24-12/27, and in discussion with pharmacy, it would likely be a good idea to at least hold resuming the p.o. flecanide for several days to allow for amiodarone washout.  Would likely be a good idea to resume flecainide in about 2 weeks flecainide.  We will have her follow-up with Oda Kilts in EP.  We want to try to avoid undue QT prolongation.  (has pacer for backup). -Stop IV heparin, place her back on Xarelto, clear diet per surgery. -Her overall burden of atrial fibrillation was 13.8%, prior to that 45% after interrogating pacemaker.  At this time, she was not interested in any further antiarrhythmic therapy.  If burden increases, may need to pursue rate control strategy only and stop flecainide.  This can be handled as outpatient.  Reviewed [place] note. -We will give her metoprolol tartrate p.o. 25 mg 4 times daily.  Sick sinus syndrome Pacemaker for backup  Essential hypertension -Stable  Small bowel resection lysis of adhesions -Surgery notes reviewed.  AKI  - improved. Labs reviewed.   For questions or updates, please contact [place] Please consult [email or web address] for contact info under        Signed, Candee Furbish, MD  02/06/2019, 9:26 AM

## 2019-02-06 NOTE — Progress Notes (Signed)
PROGRESS NOTE    Megan Hester  [id number] DOB: May 29, 1930 DOA: 02/01/2019 PCP: [name], MD   Brief Narrative:  85 year old Caucasian female with a past medical history significant for but not limited to sick sinus syndrome status post permanent pacemaker, atrial fibrillation on chronic anticoagulation with Xarelto, hypertension, GERD as well as other comorbidities who presented to the ED with 1 day of abdominal discomfort as well as nausea and vomiting.  In the ED she was noted to be hypotensive and was given fluid resuscitation and blood pressure improved.  Creatinine was elevated and she had a CT scan that was noted for closed-loop obstruction in the lower abdomen with possible early ischemia.  She underwent surgical intervention with a laparoscopic and diagnostic lysis of adhesions overall laparoscopic small bowel resection and application of the wound VAC.  Postoperatively she had some delirium which is now improved.  Subsequently her heart rate went into atrial fibrillation with RVR and cardiology was consulted and she is in place on amiodarone drip as well as a heparin drip.    NG tube has been removed, tolerating orals therefore transitioning cardiac meds to p.o.   Assessment & Plan:   Principal Problem:   Closed loop SBO (small bowel obstruction)  Active Problems:   A-fib (HCC)   Hypertension   Sick sinus syndrome (HCC)   Sepsis (HCC)   Chronic anticoagulation   Cardiac pacemaker in place - Medtronic [id number] Advisa DR MRI   Rectocele   DM (diabetes mellitus) ([place])   History of adenomatous polyp of colon   Bowel obstruction (HCC)   Acute focal ischemia of small intestine (HCC)  Small bowel obstruction status post laparoscopic lysis of adhesion and small bowel resection Sepsis, present prior to admission.  Sepsis physiology resolved. -Postop day 5.  IV meropenem -NG tube removed.  Diet as tolerated -Wound VAC management per general surgery-Monday Wednesday  Friday -Pain control.  Atrial fibrillation with RVR Sick sinus syndrome status post pacemaker -Originally on amiodarone drip, transitioning to p.o flecainide after amiodarone washout per cardiology.  Can get IV and p.o. Lopressor as needed -IV heparin drip, will be transitioned to oral Xarelto  Acute kidney injury -Prerenal.  Resolved with IV fluids  Hypokalemia -Aggressive repletion as needed  Intermittent delirium -ICU delirium along with advanced age -On olanzapine and Haldol as needed -Supportive care  Macrocytic anemia -Iron studies reviewed.  B12 normal. 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  Consultants:   Cardiology  Surgery  Procedures:   Laparoscopic abdominal surgery performed 12/22  Antimicrobials:   Meropenem   Subjective: Pleasantly confused this morning, sitting out in the chair.  Heart rate fluctuating between 100-120.  NG tube removed.  Denies any complaints  Review of Systems Otherwise negative except as per HPI, including: General: Denies fever, chills, night sweats or unintended weight loss. Resp: Denies cough, wheezing, shortness of breath. Cardiac: Denies chest pain, palpitations, orthopnea, paroxysmal nocturnal dyspnea. GI: Denies abdominal pain, nausea, vomiting, diarrhea or constipation GU: Denies dysuria, frequency, hesitancy or incontinence MS: Denies muscle aches, joint pain or swelling Neuro: Denies headache, neurologic deficits (focal weakness, numbness, tingling), abnormal gait Psych: Denies anxiety, depression, SI/HI/AVH Skin: Denies new rashes or lesions  ID: Denies sick contacts, exotic  exposures, travel  Objective: Vitals:   02/06/19 0700 02/06/19 0800 02/06/19 0900 02/06/19 0901  BP: (!) 148/114 (!) 170/97 136/83   Pulse: 91  (!) 124 (!) 108  Resp: 19 (!) 21 19 (!) 24  Temp:    98.1 F (36.7 C)  TempSrc:    Oral  SpO2: 99%  99% 100%  Weight:      Height:        Intake/Output Summary (Last 24 hours) at 02/06/2019 1056 Last data filed at 02/06/2019 1034 Gross per 24 hour  Intake 1911.1 ml  Output 875 ml  Net 1036.1 ml   Filed Weights   02/03/19 0500 02/04/19 0405 02/06/19 0500  Weight: 64.2 kg 64.2 kg 65.4 kg    Examination:  General exam: Pleasantly confused but not in acute distress Respiratory system: Clear to auscultation. Respiratory effort normal. Cardiovascular system: Atrial fibrillation with RVR Gastrointestinal system: Surgical scar on abdomen noted.  Nontender nondistended, soft.  Wound VAC in place Central nervous system: Alert and oriented. No focal neurological deficits. Extremities: Symmetric 4 x 5 power. Skin: Surgical scar on the abdomen noted Psychiatry: Judgement and insight appear to be poor.  Alert to name only    Data Reviewed:   CBC: Recent Labs  Lab 02/02/19 0157 02/03/19 0900 02/04/19 0650 02/05/19 0407 02/06/19 0214  WBC 8.8 10.0 6.6  6.4 5.3 6.5  NEUTROABS  --  8.4* 5.0  4.9  --  4.4  HGB 13.4 10.1* 9.4*  9.2* 10.5* 9.1*  HCT 41.4 31.4* 28.3*  28.1* 33.7* 28.2*  MCV 110.1* 114.2* 109.7*  112.0* 115.0* 110.2*  PLT 185 163 99*  104* 162 0000000   Basic Metabolic Panel: Recent Labs  Lab 02/02/19 0157 02/03/19 0900 02/04/19 0203 02/05/19 0841 02/06/19 0214  NA 138 140 141 144 139  K 4.6 3.9 4.2 3.4* 3.0*  CL 108 106 109 109 106  CO2 20* 25 22 25 24   GLUCOSE 160* 95 81 87 169*  BUN 33* 35* 31* 28* 19  CREATININE 1.32* 0.86 0.74 0.62 0.63  CALCIUM 8.6* 9.0 8.8* 8.7* 8.2*  MG 2.1  --  2.2 2.0 1.8  PHOS  --   --   --  2.3* 3.0   GFR: Estimated Creatinine Clearance: 43.6 mL/min (by C-G formula based on  SCr of 0.63 mg/dL). Liver Function Tests: Recent Labs  Lab 02/01/19 1056 02/02/19 0157 02/03/19 0900 02/05/19 0841 02/06/19 0214  AST 24 20 23 21 23   ALT 16 11 13 15 17   ALKPHOS 146* 98 96 93 90  BILITOT 1.7* 1.1 1.0 0.8 1.0  PROT 7.1 5.6* 5.6* 5.2* 5.0*  ALBUMIN 4.0 3.0* 2.8* 2.6* 2.5*   Recent Labs  Lab 02/01/19 1056  LIPASE 26   No results for input(s): AMMONIA in the last 168 hours. Coagulation Profile: Recent Labs  Lab 02/01/19 1638  INR 1.5*   Cardiac Enzymes: No results for input(s): CKTOTAL, CKMB, CKMBINDEX, TROPONINI in the last 168 hours. BNP (last 3 results) No results for input(s): PROBNP in the last 8760 hours. HbA1C: No results for input(s): HGBA1C in the last 72 hours. CBG: No results for input(s): GLUCAP in the last 168 hours. Lipid Profile: No results for input(s): CHOL, HDL, LDLCALC, TRIG, CHOLHDL, LDLDIRECT in the last 72 hours. Thyroid Function Tests: No results for input(s): TSH, T4TOTAL, FREET4, T3FREE, THYROIDAB in the last 72 hours. Anemia Panel: Recent Labs    02/06/19 0214  VITAMINB12 813  FOLATE  16.5  FERRITIN 453*  TIBC 151*  IRON 51  RETICCTPCT 1.0   Sepsis Labs: Recent Labs  Lab 02/01/19 1638  LATICACIDVEN 2.9*    Recent Results (from the past 240 hour(s))  Culture, blood (routine x 2)     Status: None   Collection Time: 02/01/19  4:35 PM   Specimen: BLOOD  Result Value Ref Range Status   Specimen Description   Final    BLOOD RIGHT WRIST Performed at [facility] [address], [place], [place] [address]    Special Requests   Final    BOTTLES DRAWN AEROBIC AND ANAEROBIC Blood Culture adequate volume Performed at [facility] [address], [place], [place] [address]    Culture   Final    NO GROWTH 5 DAYS Performed at [facility], [place] [address], [place], [place] [address]    Report Status 02/06/2019 FINAL  Final  Respiratory Panel by RT PCR (Flu A&B, Covid) -  Nasopharyngeal Swab     Status: None   Collection Time: 02/01/19  4:38 PM   Specimen: Nasopharyngeal Swab  Result Value Ref Range Status   SARS Coronavirus 2 by RT PCR NEGATIVE NEGATIVE Final    Comment: (NOTE) SARS-CoV-2 target nucleic acids are NOT DETECTED. The SARS-CoV-2 RNA is generally detectable in upper respiratoy specimens during the acute phase of infection. The lowest concentration of SARS-CoV-2 viral copies this assay can detect is 131 copies/mL. A negative result does not preclude SARS-Cov-2 infection and should not be used as the sole basis for treatment or other patient management decisions. A negative result may occur with  improper specimen collection/handling, submission of specimen other than nasopharyngeal swab, presence of viral mutation(s) within the areas targeted by this assay, and inadequate number of viral copies (<131 copies/mL). A negative result must be combined with clinical observations, patient history, and epidemiological information. The expected result is Negative. Fact Sheet for Patients:  PinkCheek.be Fact Sheet for Healthcare Providers:  GravelBags.it This test is not yet ap proved or cleared by the [place] FDA and  has been authorized for detection and/or diagnosis of SARS-CoV-2 by FDA under an Emergency Use Authorization (EUA). This EUA will remain  in effect (meaning this test can be used) for the duration of the COVID-19 declaration under Section 564(b)(1) of the Act, 21 U.S.C. section 360bbb-3(b)(1), unless the authorization is terminated or revoked sooner.    Influenza A by PCR NEGATIVE NEGATIVE Final   Influenza B by PCR NEGATIVE NEGATIVE Final    Comment: (NOTE) The Xpert Xpress SARS-CoV-2/FLU/RSV assay is intended as an aid in  the diagnosis of influenza from Nasopharyngeal swab specimens and  should not be used as a sole basis for treatment. Nasal washings and  aspirates  are unacceptable for Xpert Xpress SARS-CoV-2/FLU/RSV  testing. Fact Sheet for Patients: PinkCheek.be Fact Sheet for Healthcare Providers: GravelBags.it This test is not yet approved or cleared by the [place] FDA and  has been authorized for detection and/or diagnosis of SARS-CoV-2 by  FDA under an Emergency Use Authorization (EUA). This EUA will remain  in effect (meaning this test can be used) for the duration of the  Covid-19 declaration under Section 564(b)(1) of the Act, 21  U.S.C. section 360bbb-3(b)(1), unless the authorization is  terminated or revoked. Performed at [facility], [place] [address], [place], [place] [address]   Culture, blood (routine x 2)     Status: None   Collection Time: 02/01/19  4:51 PM   Specimen: BLOOD  Result Value Ref Range Status   Specimen Description   Final    BLOOD RIGHT ANTECUBITAL Performed at [facility] [address], [place], [place] [address]    Special Requests   Final    BOTTLES DRAWN AEROBIC AND ANAEROBIC Blood Culture adequate volume Performed at [facility] [address], [place], [place] [address]    Culture   Final    NO GROWTH 5 DAYS Performed at [facility], [place] [address], [place], [place] [address]    Report Status 02/06/2019 FINAL  Final  MRSA PCR Screening     Status: None   Collection Time: 02/01/19 10:49 PM   Specimen: Nasal Mucosa; Nasopharyngeal  Result Value Ref Range Status   MRSA by PCR NEGATIVE NEGATIVE Final    Comment:        The GeneXpert MRSA Assay (FDA approved for NASAL specimens only), is one component of a comprehensive MRSA colonization surveillance program. It is not intended to diagnose MRSA infection nor to guide or monitor treatment for MRSA infections. Performed at [facility], [place] [address], [place], [place] [address]           Radiology Studies: No results found.      Scheduled Meds: . bisacodyl  10 mg Rectal Daily  . Chlorhexidine Gluconate Cloth  6 each Topical Daily  . lip balm  1 application Topical BID  . mouth rinse  15 mL Mouth Rinse BID  . metoprolol tartrate  25 mg Oral QID  . neomycin-polymyxin b-dexamethasone  1 application Right Eye QHS  . polyvinyl alcohol   Right Eye BID  . rivaroxaban  20 mg Oral Q supper  . sodium chloride flush  10-40 mL Intracatheter Q12H  . sodium chloride flush  3 mL Intravenous Once   Continuous Infusions: . famotidine (PEPCID) IV Stopped (02/06/19 1029)  . lactated ringers 50 mL/hr at 02/05/19 1205  . methocarbamol (ROBAXIN) IV    . ondansetron (ZOFRAN) IV    . potassium chloride 100 mL/hr at 02/06/19 1034     LOS: 5 days   Time spent= 35 mins    Rukiya Hodgkins Arsenio Loader, MD [facility]  If 7PM-7AM, please contact night-coverage  02/06/2019, 10:56 AM

## 2019-02-06 NOTE — Progress Notes (Addendum)
[facility] for IV heparin Indication: atrial fibrillation  Allergies  Allergen Reactions  . Shellfish Allergy Hives and Swelling    Shrimp and lobster only  . Atropine     Causes AFIB  . Codeine     Leg pain  . Doxycycline     unknown  . Fenoprofen Calcium Swelling  . Iodinated Diagnostic Agents Hives and Other (See Comments)  . Isopto Hyoscine [Scopolamine]     Causes AFIB  . Levaquin [Levofloxacin]     Rash & causes AFIB  . Other     Other reaction(s): Bleeding (intolerance) Nutrasweet  Naphon - swelling  . Oxycodone     Depression - pt tolerates as needed   . Penicillins     Has patient had a PCN reaction causing immediate rash, facial/tongue/throat swelling, SOB or lightheadedness with hypotension: unknown Has patient had a PCN reaction causing severe rash involving mucus membranes or skin necrosis: unknown Has patient had a PCN reaction that required hospitalization unknown Has patient had a PCN reaction occurring within the last 10 years: childhood If all of the above answers are "NO", then may proceed with Cephalosporin use. unknown  . Sulfa Drugs Cross Reactors     Ankle swelling   . Warfarin     Other reaction(s): Bleeding (intolerance) From eyes  . Ceclor [Cefaclor] Hives  . Tape Rash    Use paper tape    Patient Measurements: Height: 5\' 4"  (162.6 cm) Weight: 144 lb 2.9 oz (65.4 kg) IBW/kg (Calculated) : 54.7 Heparin Dosing Weight: 61.2  Vital Signs: Temp: 98.3 F (36.8 C) (12/27 0355) Temp Source: Oral (12/27 0355) BP: 148/114 (12/27 0700) Pulse Rate: 91 (12/27 0700)  Labs: Recent Labs    02/03/19 0900 02/04/19 0203 02/04/19 0650 02/05/19 0407 02/05/19 0841 02/05/19 1248 02/05/19 2043 02/06/19 0214  HGB  --   --  9.4*  9.2* 10.5*  --   --   --  9.1*  HCT  --   --  28.3*  28.1* 33.7*  --   --   --  28.2*  PLT  --   --  99*  104* 162  --   --   --  181  HEPARINUNFRC   < >  --   --   --   --  0.19*  0.33 0.44  CREATININE  --  0.74  --   --  0.62  --   --  0.63   < > = values in this interval not displayed.    Estimated Creatinine Clearance: 43.6 mL/min (by C-G formula based on SCr of 0.63 mg/dL).  Medications:  Scheduled:  . bisacodyl  10 mg Rectal Daily  . Chlorhexidine Gluconate Cloth  6 each Topical Daily  . lip balm  1 application Topical BID  . mouth rinse  15 mL Mouth Rinse BID  . metoprolol tartrate  5 mg Intravenous Q6H  . neomycin-polymyxin b-dexamethasone  1 application Right Eye QHS  . polyvinyl alcohol   Right Eye BID  . sodium chloride flush  10-40 mL Intracatheter Q12H  . sodium chloride flush  3 mL Intravenous Once    Assessment: Pharmacy is consulted to start heparin IV for 85 yo female with atrial fibrillation.  Pt with recent bowel resection on 12/22. On Xarelto PTA, LD on 12/20. Pt was subsequently changed to prophylactic Lovenox, of which last dose was on 12/24. Surgery requesting no bolus.  Today, 12/26  HL 0.44, therapeutic  Hgb decreased  to 9.1, Plts WNL  No bleeding, No line or infusion issues per RN   Goal of Therapy:  Heparin level 0.3-0.7 units/ml Monitor platelets by anticoagulation protocol: Yes   Plan:   Continue heparin drip at 1150 units/hr.   no bolus per surgery request due to recent bowel resection procedure  Daily CBC and heparin level  Monitor for signs and symptoms of bleeding  Dolly Rias RPh 02/06/2019, 7:25 AM  Pharmacy consulted to resume home xarelto Plan: Turn off heparin drip and stop heparin labs Give xarelto 20mg  po once daily Pharmacy will sign off and follow peripherally  Dolly Rias RPh 02/06/2019, 8:28 AM

## 2019-02-06 NOTE — Progress Notes (Addendum)
[facility] Office:  [phone] General Surgery Progress Note   LOS: 5 days  POD -  5 Days Post-Op  Chief Complaint: Abdominal pain  Assessment and Plan: 1. LAPAROSCOPY DIAGNOSTIC, LYSIS OF ADHESION, LAPAROSCOPIC SMALL BOWEL RESECTION, APPLICATION OF WOUND VAC - 02/01/2019 - Gross  On Merrem  WBC - 6,500 - 02/06/2019  Wound with VAC - to change MWF  To start clear liquid tray today.  She went to [facility] briefly, but bounced back to step down because of IV amiodarone.  She is a little more confused this AM.  2.  Sick sinus syndrome 3.  Atrial fibrillation-  on Xarelto/flecainide/Cardizem  Discussed with Dr. Marlou Porch - can switch meds to po for A Fib 4.  Hypertension 5.  Hx right IN nuclear lesion 6.  DVT prophylaxis - on IV heparin 7.  Anemia   Hgb - 9.1 - 02/06/2019 8.  Mildly confused - seems to get worse at night. 9.  K+ - 3.0 - 02/06/2019   Principal Problem:   Closed loop SBO (small bowel obstruction)  Active Problems:   A-fib (HCC)   Hypertension   Sick sinus syndrome (HCC)   Sepsis (HCC)   Chronic anticoagulation   Cardiac pacemaker in place - Medtronic [id number] Advisa DR MRI   Rectocele   DM (diabetes mellitus) ([place])   History of adenomatous polyp of colon   Bowel obstruction (HCC)   Acute focal ischemia of small intestine (HCC)  Subjective:  Tolerating liquids from floor - she wants more.  Mildly confused.  Her contact is her son, Mariha Bettendorf.    Objective:   Vitals:   02/06/19 0600 02/06/19 0700  BP: (!) 142/92 (!) 148/114  Pulse: 85 91  Resp: 17 19  Temp:    SpO2: 99% 99%     Intake/Output from previous day:  12/26 0701 - 12/27 0700 In: 1631.1 [I.V.:1415.2; IV Piggyback:215.9] Out: 875 [Urine:775; Emesis/NG output:100]  Intake/Output this shift:  No intake/output data recorded.   Physical Exam:   General: WN older WF who is alert   HEENT: Normal. Pupils equal. .   Lungs: Clear.   Abdomen: Soft.  Has BS   Wound: Has VAC on  abdomen.   Lab Results:    Recent Labs    02/05/19 0407 02/06/19 0214  WBC 5.3 6.5  HGB 10.5* 9.1*  HCT 33.7* 28.2*  PLT 162 181    BMET   Recent Labs    02/05/19 0841 02/06/19 0214  NA 144 139  K 3.4* 3.0*  CL 109 106  CO2 25 24  GLUCOSE 87 169*  BUN 28* 19  CREATININE 0.62 0.63  CALCIUM 8.7* 8.2*    PT/INR   No results for input(s): LABPROT, INR in the last 72 hours.  ABG  No results for input(s): PHART, HCO3 in the last 72 hours.  Invalid input(s): PCO2, PO2   Studies/Results:  No results found.   Anti-infectives:   Anti-infectives (From admission, onward)   Start     Dose/Rate Route Frequency Ordered Stop   02/02/19 0600  meropenem (MERREM) 1 g in sodium chloride 0.9 % 100 mL IVPB     1 g 200 mL/hr over 30 Minutes Intravenous Every 12 hours 02/01/19 2243 02/04/19 0604   02/01/19 1730  clindamycin (CLEOCIN) IVPB 900 mg     900 mg 100 mL/hr over 30 Minutes Intravenous On call to O.R. 02/01/19 1654 02/01/19 2009   02/01/19 1730  gentamicin (GARAMYCIN) 310 mg in dextrose 5 %  100 mL IVPB     5 mg/kg  61.2 kg 107.8 mL/hr over 60 Minutes Intravenous On call to O.R. 02/01/19 1654 02/01/19 1957   02/01/19 1545  meropenem (MERREM) 1 g in sodium chloride 0.9 % 100 mL IVPB     1 g 200 mL/hr over 30 Minutes Intravenous STAT 02/01/19 1533 02/01/19 1724      Alphonsa Overall, MD, [facility] Surgery Office: [phone] 02/06/2019

## 2019-02-07 LAB — COMPREHENSIVE METABOLIC PANEL
ALT: 19 U/L (ref 0–44)
AST: 24 U/L (ref 15–41)
Albumin: 2.5 g/dL — ABNORMAL LOW (ref 3.5–5.0)
Alkaline Phosphatase: 87 U/L (ref 38–126)
Anion gap: 9 (ref 5–15)
BUN: 19 mg/dL (ref 8–23)
CO2: 22 mmol/L (ref 22–32)
Calcium: 8.1 mg/dL — ABNORMAL LOW (ref 8.9–10.3)
Chloride: 109 mmol/L (ref 98–111)
Creatinine, Ser: 0.54 mg/dL (ref 0.44–1.00)
GFR calc Af Amer: >60 mL/min (ref >60)
GFR calc non Af Amer: >60 mL/min (ref >60)
Glucose, Bld: 103 mg/dL — ABNORMAL HIGH (ref 70–99)
Potassium: 4 mmol/L (ref 3.5–5.1)
Sodium: 140 mmol/L (ref 135–145)
Total Bilirubin: 2 mg/dL — ABNORMAL HIGH (ref 0.3–1.2)
Total Protein: 4.8 g/dL — ABNORMAL LOW (ref 6.5–8.1)

## 2019-02-07 LAB — TSH: TSH: 1.551 u[IU]/mL (ref 0.350–4.500)

## 2019-02-07 LAB — CBC
HCT: 28.1 % — ABNORMAL LOW (ref 36.0–46.0)
Hemoglobin: 9.3 g/dL — ABNORMAL LOW (ref 12.0–15.0)
MCH: 36.9 pg — ABNORMAL HIGH (ref 26.0–34.0)
MCHC: 33.1 g/dL (ref 30.0–36.0)
MCV: 111.5 fL — ABNORMAL HIGH (ref 80.0–100.0)
Platelets: 209 10*3/uL (ref 150–400)
RBC: 2.52 MIL/uL — ABNORMAL LOW (ref 3.87–5.11)
RDW: 15 % (ref 11.5–15.5)
WBC: 10.1 10*3/uL (ref 4.0–10.5)
nRBC: 0.2 % (ref 0.0–0.2)

## 2019-02-07 LAB — T4, FREE: Free T4: 1.4 ng/dL — ABNORMAL HIGH (ref 0.61–1.12)

## 2019-02-07 LAB — MAGNESIUM: Magnesium: 1.9 mg/dL (ref 1.7–2.4)

## 2019-02-07 MED ORDER — METOPROLOL TARTRATE 25 MG PO TABS
37.5000 mg | ORAL_TABLET | Freq: Four times a day (QID) | ORAL | Status: DC
  Administered 2019-02-07 – 2019-02-08 (×3): 37.5 mg via ORAL
  Filled 2019-02-07 (×3): qty 1

## 2019-02-07 MED ORDER — METOPROLOL TARTRATE 5 MG/5ML IV SOLN
5.0000 mg | INTRAVENOUS | Status: DC | PRN
  Administered 2019-02-07 – 2019-02-13 (×7): 5 mg via INTRAVENOUS
  Filled 2019-02-07 (×8): qty 5

## 2019-02-07 MED ORDER — METOPROLOL TARTRATE 25 MG PO TABS: 25.0000 mg | ORAL_TABLET | Freq: Once | ORAL | Status: DC

## 2019-02-07 MED ORDER — METOPROLOL TARTRATE 5 MG/5ML IV SOLN
5.0000 mg | INTRAVENOUS | Status: DC | PRN
  Administered 2019-02-07: 11:00:00 5 mg via INTRAVENOUS
  Filled 2019-02-07: qty 5

## 2019-02-07 MED ORDER — METOPROLOL TARTRATE 25 MG PO TABS
25.0000 mg | ORAL_TABLET | Freq: Four times a day (QID) | ORAL | Status: DC
  Administered 2019-02-07 (×3): 25 mg via ORAL
  Filled 2019-02-07 (×3): qty 1

## 2019-02-07 NOTE — Progress Notes (Signed)
Patients HR remaining elevated in 120-130s despite prn and scheduled lopressor. Cardiology NP on call as well as Dr. Reesa Chew notified. Hospitalist order 25mg  metoprolol PO once now. Awaiting call back from cardiology.

## 2019-02-07 NOTE — Progress Notes (Signed)
PROGRESS NOTE    Megan Hester  [id number] DOB: [birth year] DOA: 02/01/2019 PCP: [name], MD   Brief Narrative:  85 year old Caucasian female with a past medical history significant for but not limited to sick sinus syndrome status post permanent pacemaker, atrial fibrillation on chronic anticoagulation with Xarelto, hypertension, GERD as well as other comorbidities who presented to the ED with 1 day of abdominal discomfort as well as nausea and vomiting.  In the ED she was noted to be hypotensive and was given fluid resuscitation and blood pressure improved.  Creatinine was elevated and she had a CT scan that was noted for closed-loop obstruction in the lower abdomen with possible early ischemia.  She underwent surgical intervention with a laparoscopic and diagnostic lysis of adhesions overall laparoscopic small bowel resection and application of the wound VAC.  Postoperatively she had some delirium which is now improved.  Subsequently her heart rate went into atrial fibrillation with RVR and cardiology was consulted and she is in place on amiodarone drip as well as a heparin drip.    NG tube has been removed, tolerating orals therefore transitioning cardiac meds to p.o.   Assessment & Plan:   Principal Problem:   Closed loop SBO (small bowel obstruction)  Active Problems:   A-fib (HCC)   Hypertension   Sick sinus syndrome (HCC)   Sepsis (HCC)   Chronic anticoagulation   Cardiac pacemaker in place - Med[employer]onic [id number] Advisa DR MRI   Rectocele   DM (diabetes mellitus) ([place])   History of adenomatous polyp of colon   Bowel obstruction (HCC)   Acute focal ischemia of small intestine (HCC)  Small bowel obstruction status post laparoscopic lysis of adhesion and small bowel resection Sepsis, present prior to admission.  Sepsis physiology resolved. -Postop day 6.  IV meropenem -NG tube removed.  Diet as tolerated -Wound VAC management per general surgery-MWF -Pain  control.  Atrial fibrillation with RVR, persist Sick sinus syndrome status post pacemaker -Amiodarone drip stopped 12/27, allow for washout then start flecainide outpatient.  Currently on p.o. metoprolol, should be uptitrated -Oral Xarelto -Cardiology following  Acute kidney injury -Prerenal.  Resolved with IV fluids  Hypokalemia -Aggressive repletion as needed  Intermittent delirium, intermittent -ICU delirium along with advanced age -On olanzapine and Haldol as needed -Supportive care, sitter in place  Macrocytic anemia -Iron studies reviewed.  B12 normal. 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  Heart rate remains in 120s in atrial fibrillation. Sitter at bedside  Review of Systems Otherwise negative except as per HPI, including: General = no fevers, chills, dizziness, malaise, fatigue HEENT/EYES = negative for pain, redness, loss of vision, double vision, blurred vision, loss of hearing, sore throat, hoarseness, dysphagia Cardiovascular= negative for chest pain, palpitation, murmurs, lower extremity swelling Respiratory/lungs= negative for shortness of breath, cough, hemoptysis, wheezing, mucus production Gastrointestinal= negative for nausea, vomiting,, abdominal pain, melena, hematemesis Genitourinary= negative for Dysuria, Hematuria, Change in Urinary Frequency MSK = Negative for arthralgia, myalgias, Back Pain, Joint swelling  Neurology= Negative  for headache, seizures, numbness,  tingling  Psychiatry= Negative for anxiety, depression, suicidal and homocidal ideation Allergy/Immunology= Medication/Food allergy as listed  Skin= Negative for Rash, lesions, ulcers, itching   Objective: Vitals:   02/07/19 0600 02/07/19 0744 02/07/19 0911 02/07/19 1037  BP: 109/70     Pulse: (!) 131  (!) 132 (!) 132  Resp: 20  (!) 23 (!) 25  Temp:  98 F (36.7 C)    TempSrc:  Oral    SpO2: 97%  100% 98%  Weight:      Height:        Intake/Output Summary (Last 24 hours) at 02/07/2019 1051 Last data filed at 02/07/2019 0000 Gross per 24 hour  Intake 1513.71 ml  Output 1 ml  Net 1512.71 ml   Filed Weights   02/04/19 0405 02/06/19 0500 02/07/19 0415  Weight: 64.2 kg 65.4 kg 80.3 kg    Examination: Constitutional: Pleasantly confused but not any acute distress Respiratory: Clear to auscultation bilaterally Cardiovascular: Irregularly irregular rhythm, tachycardia Abdomen: Surgical scar on the abdomen noted with wound VAC in place Musculoskeletal: No edema noted Skin: No rashes seen Neurologic: CN 2-12 grossly intact.  And nonfocal, alert to name and place only. Psychiatric: Poor judgment and insight   Data Reviewed:   CBC: Recent Labs  Lab 02/03/19 0900 02/04/19 0650 02/05/19 0407 02/06/19 0214 02/07/19 0404  WBC 10.0 6.6  6.4 5.3 6.5 10.1  NEUTROABS 8.4* 5.0  4.9  --  4.4  --   HGB 10.1* 9.4*  9.2* 10.5* 9.1* 9.3*  HCT 31.4* 28.3*  28.1* 33.7* 28.2* 28.1*  MCV 114.2* 109.7*  112.0* 115.0* 110.2* 111.5*  PLT 163 99*  104* 162 181 [id number]   Basic Metabolic Panel: Recent Labs  Lab 02/02/19 0157 02/03/19 0900 02/04/19 0203 02/05/19 0841 02/06/19 0214 02/07/19 0404  NA 138 140 141 144 139 140  K 4.6 3.9 4.2 3.4* 3.0* 4.0  CL 108 106 109 109 106 109  CO2 20* 25 22 25 24 22   GLUCOSE 160* 95 81 87 169* 103*  BUN 33* 35* 31* 28* 19 19  CREATININE 1.32* 0.86 0.74 0.62 0.63 0.54  CALCIUM 8.6* 9.0 8.8* 8.7* 8.2* 8.1*   MG 2.1  --  2.2 2.0 1.8 1.9  PHOS  --   --   --  2.3* 3.0  --    GFR: Estimated Creatinine Clearance: 51.7 mL/min (by C-G formula based on SCr of 0.54 mg/dL). Liver Function Tests: Recent Labs  Lab 02/02/19 0157 02/03/19 0900 02/05/19 0841 02/06/19 0214 02/07/19 0404  AST 20 23 21 23 24   ALT 11 13 15 17 19   ALKPHOS 98 96 93 90 87  BILITOT 1.1 1.0 0.8 1.0 2.0*  PROT 5.6* 5.6* 5.2* 5.0* 4.8*  ALBUMIN 3.0* 2.8* 2.6* 2.5* 2.5*   Recent Labs  Lab 02/01/19 1056  LIPASE 26   No results for input(s): AMMONIA in the last 168 hours. Coagulation Profile: Recent Labs  Lab 02/01/19 1638  INR 1.5*   Cardiac Enzymes: No results for input(s): CKTOTAL, CKMB, CKMBINDEX, TROPONINI in the last 168 hours. BNP (last 3 results) No results for input(s): PROBNP in the last 8760 hours. HbA1C: No results for input(s): HGBA1C in the last 72 hours. CBG: No results for input(s): GLUCAP in the last 168 hours. Lipid Profile: No results for input(s): CHOL, HDL, LDLCALC, TRIG, CHOLHDL, LDLDIRECT in the last 72 hours. Thyroid Function Tests: Recent Labs    02/07/19 0404  TSH 1.551  FREET4 1.40*   Anemia Panel: Recent  Labs    02/06/19 0214  VITAMINB12 813  FOLATE 16.5  FERRITIN 453*  TIBC 151*  IRON 51  RETICCTPCT 1.0   Sepsis Labs: Recent Labs  Lab 02/01/19 1638  LATICACIDVEN 2.9*    Recent Results (from the past 240 hour(s))  Culture, blood (routine x 2)     Status: None   Collection Time: 02/01/19  4:35 PM   Specimen: BLOOD  Result Value Ref Range Status   Specimen Description   Final    BLOOD RIGHT WRIST Performed at [facility] [address], [place], [place] [address]    Special Requests   Final    BOTTLES DRAWN AEROBIC AND ANAEROBIC Blood Culture adequate volume Performed at [facility] [address], [place], [place] [address]    Culture   Final    NO GROWTH 5 DAYS Performed at [facility], [place] [address], [place], [place] [address]    Report Status 02/06/2019 FINAL  Final  Respiratory Panel by RT PCR (Flu A&B, Covid) - Nasopharyngeal Swab     Status: None   Collection Time: 02/01/19  4:38 PM   Specimen: Nasopharyngeal Swab  Result Value Ref Range Status   SARS Coronavirus 2 by RT PCR NEGATIVE NEGATIVE Final    Comment: (NOTE) SARS-CoV-2 target nucleic acids are NOT DETECTED. The SARS-CoV-2 RNA is generally detectable in upper respiratoy specimens during the acute phase of infection. The lowest concentration of SARS-CoV-2 viral copies this assay can detect is 131 copies/mL. A negative result does not preclude SARS-Cov-2 infection and should not be used as the sole basis for treatment or other patient management decisions. A negative result may occur with  improper specimen collection/handling, submission of specimen other than nasopharyngeal swab, presence of viral mutation(s) within the areas targeted by this assay, and inadequate number of viral copies (<131 copies/mL). A negative result must be combined with clinical observations, patient history, and epidemiological information. The expected result is Negative. Fact Sheet for Patients:  PinkCheek.be Fact Sheet for Healthcare Providers:  GravelBags.it This test is not yet ap proved or cleared by the [place] FDA and  has been authorized for detection and/or diagnosis of SARS-CoV-2 by FDA under an Emergency Use Authorization (EUA). This EUA will remain  in effect (meaning this test can be used) for the duration of the COVID-19 declaration under Section 564(b)(1) of the Act, 21 U.S.C. section 360bbb-3(b)(1), unless the authorization is terminated or revoked sooner.    Influenza A by PCR NEGATIVE NEGATIVE Final   Influenza B by PCR NEGATIVE NEGATIVE Final    Comment: (NOTE) The Xpert Xpress SARS-CoV-2/FLU/RSV assay is intended as an aid in  the diagnosis of influenza  from Nasopharyngeal swab specimens and  should not be used as a sole basis for treatment. Nasal washings and  aspirates are unacceptable for Xpert Xpress SARS-CoV-2/FLU/RSV  testing. Fact Sheet for Patients: PinkCheek.be Fact Sheet for Healthcare Providers: GravelBags.it This test is not yet approved or cleared by the [place] FDA and  has been authorized for detection and/or diagnosis of SARS-CoV-2 by  FDA under an Emergency Use Authorization (EUA). This EUA will remain  in effect (meaning this test can be used) for the duration of the  Covid-19 declaration under Section 564(b)(1) of the Act, 21  U.S.C. section 360bbb-3(b)(1), unless the authorization is  terminated or revoked. Performed at [facility], [place] [address] [place], [place] [address]   Culture, blood (routine x 2)  Status: None   Collection Time: 02/01/19  4:51 PM   Specimen: BLOOD  Result Value Ref Range Status   Specimen Description   Final    BLOOD RIGHT ANTECUBITAL Performed at [facility] [address] [place], [place] [address]    Special Requests   Final    BOTTLES DRAWN AEROBIC AND ANAEROBIC Blood Culture adequate volume Performed at [facility] [address], [place], [place] [address]    Culture   Final    NO GROWTH 5 DAYS Performed at [facility], [place] [address], [place], [place] [address]    Report Status 02/06/2019 FINAL  Final  MRSA PCR Screening     Status: None   Collection Time: 02/01/19 10:49 PM   Specimen: Nasal Mucosa; Nasopharyngeal  Result Value Ref Range Status   MRSA by PCR NEGATIVE NEGATIVE Final    Comment:        The GeneXpert MRSA Assay (FDA approved for NASAL specimens only), is one component of a comprehensive MRSA colonization surveillance program. It is not intended to diagnose MRSA infection nor to guide or monitor treatment for MRSA  infections. Performed at [facility], [place] [address], [place], [place] [address]          Radiology Studies: No results found.      Scheduled Meds: . bisacodyl  10 mg Rectal Daily  . Chlorhexidine Gluconate Cloth  6 each Topical Daily  . lip balm  1 application Topical BID  . mouth rinse  15 mL Mouth Rinse BID  . metoprolol tartrate  25 mg Oral Q6H  . neomycin-polymyxin b-dexamethasone  1 application Right Eye QHS  . polyvinyl alcohol   Right Eye BID  . rivaroxaban  20 mg Oral Q supper  . sodium chloride flush  10-40 mL Intracatheter Q12H  . sodium chloride flush  3 mL Intravenous Once   Continuous Infusions: . famotidine (PEPCID) IV Stopped (02/07/19 1036)  . lactated ringers 50 mL/hr at 02/07/19 0000  . methocarbamol (ROBAXIN) IV    . ondansetron (ZOFRAN) IV       LOS: 6 days   Time spent= 35 mins    Maddilyn Campus Arsenio Loader, MD [facility]  If 7PM-7AM, please contact night-coverage  02/07/2019, 10:51 AM

## 2019-02-07 NOTE — Progress Notes (Addendum)
Progress Note  Patient Name: Megan Hester Date of Encounter: 02/07/2019  Primary Cardiologist: Donato Heinz, MD  Electrophysiologist: [name], MD   Subjective   Currently, patient is oriented to name, place.  She is able to tell me why she is in the hospital.  She is not having much in the way of abdominal pain.  She had some nausea and belly pain when she tried to take p.o.'s, so is reluctant to do that.    No awareness of the atrial fibrillation.  No shortness of breath or chest pain.  Inpatient Medications    Scheduled Meds: . bisacodyl  10 mg Rectal Daily  . Chlorhexidine Gluconate Cloth  6 each Topical Daily  . lip balm  1 application Topical BID  . mouth rinse  15 mL Mouth Rinse BID  . metoprolol tartrate  25 mg Oral QID  . neomycin-polymyxin b-dexamethasone  1 application Right Eye QHS  . polyvinyl alcohol   Right Eye BID  . rivaroxaban  20 mg Oral Q supper  . sodium chloride flush  10-40 mL Intracatheter Q12H  . sodium chloride flush  3 mL Intravenous Once   Continuous Infusions: . famotidine (PEPCID) IV Stopped (02/06/19 2230)  . lactated ringers 50 mL/hr at 02/07/19 0000  . methocarbamol (ROBAXIN) IV    . ondansetron (ZOFRAN) IV     PRN Meds: alum & mag hydroxide-simeth, clobetasol cream, diphenhydrAMINE, enalaprilat, haloperidol lactate, labetalol, magic mouthwash, menthol-cetylpyridinium, methocarbamol (ROBAXIN) IV, morphine injection, naphazoline-glycerin, OLANZapine, ondansetron (ZOFRAN) IV **OR** ondansetron (ZOFRAN) IV, phenol, prochlorperazine, simethicone, sodium chloride flush   Vital Signs    Vitals:   02/07/19 0347 02/07/19 0415 02/07/19 0500 02/07/19 0600  BP:   (!) 153/73 109/70  Pulse:   (!) 125 (!) 131  Resp:   (!) 24 20  Temp: 98.3 F (36.8 C)     TempSrc: Oral     SpO2:   98% 97%  Weight:  80.3 kg    Height:        Intake/Output Summary (Last 24 hours) at 02/07/2019 0750 Last data filed at 02/07/2019  0000 Gross per 24 hour  Intake 1967.43 ml  Output 301 ml  Net 1666.43 ml   Last 3 Weights 02/07/2019 02/06/2019 02/04/2019  Weight (lbs) 177 lb 144 lb 2.9 oz 141 lb 8.6 oz  Weight (kg) 80.287 kg 65.4 kg 64.2 kg      Telemetry    Atrial fib, rate 100+, no paced beats seen- Personally Reviewed  ECG    02/03/2019-right bundle branch block 104 atrial fibrillation- Personally Reviewed  Physical Exam   General: Well developed, well nourished, elderly female in no acute distress Head: Head normocephalic and atraumatic Lungs: Rales bases bilaterally to auscultation.  Likely atelectasis Heart: Irregular rate and rhythm, S1 S2, without rub or gallop. No murmur. 4/4 extremity pulses are 2+ & equal. No JVD. Abdomen: Bowel sounds are decreased, abdomen soft and tender without masses or  hernias noted. Msk: A little weak strength and tone for age. Extremities: No clubbing, cyanosis or edema.    Skin:  No rashes or lesions noted. Neuro: Alert and oriented X 2. Psych:  Good affect, responds appropriately  Labs    High Sensitivity Troponin:  No results for input(s): TROPONINIHS in the last 720 hours.    Chemistry Recent Labs  Lab 02/05/19 0841 02/06/19 0214 02/07/19 0404  NA 144 139 140  K 3.4* 3.0* 4.0  CL 109 106 109  CO2 25 24 22  GLUCOSE 87 169* 103*  BUN 28* 19 19  CREATININE 0.62 0.63 0.54  CALCIUM 8.7* 8.2* 8.1*  PROT 5.2* 5.0* 4.8*  ALBUMIN 2.6* 2.5* 2.5*  AST 21 23 24   ALT 15 17 19   ALKPHOS 93 90 87  BILITOT 0.8 1.0 2.0*  GFRNONAA >60 >60 >60  GFRAA >60 >60 >60  ANIONGAP 10 9 9      Hematology Recent Labs  Lab 02/05/19 0407 02/06/19 0214 02/07/19 0404  WBC 5.3 6.5 10.1  RBC 2.93* 2.56*  2.55* 2.52*  HGB 10.5* 9.1* 9.3*  HCT 33.7* 28.2* 28.1*  MCV 115.0* 110.2* 111.5*  MCH 35.8* 35.5* 36.9*  MCHC 31.2 32.3 33.1  RDW 14.9 14.7 15.0  PLT 162 181 209    BNPNo results for input(s): BNP, PROBNP in the last 168 hours.   DDimer No results for input(s):  DDIMER in the last 168 hours.   Radiology    No results found.  Cardiac Studies   May 19, 2014 echocardiogram EF 19-May-2058 with aortic root 40 mm  Patient Profile     85 y.o. female postop laparoscopic diagnostic lysis of adhesions and small bowel resection with paroxysmal atrial fibrillation  Assessment & Plan    Paroxysmal atrial fibrillation - was on Flecainide pta - here, was on IV amio 12/24-12/27, never got po amio.  Amnio DC'd so she could go back on flecainide - Pharm recs resume po Flecainide in about 2 weeks - f/u w/ Oda Kilts in EP - try to avoid QT prolongation, but has PPM  - heparin>>Xarelto resumed 12/27 - prev in Afib 45% of the time, was 13.8% of the time at most recent check - for now, continue antiarrhythmic therapy - at some point, may change to rate control only - was started on metoprolol 25 mg qid on 12/27, only got 3 doses that day, will change to q 6 hr and see if tolerates better. If she does, change to 50 mg bid  Sick sinus syndrome - has MDT PPM  Essential hypertension - SBP 108-142 last 24 hr - she was only on Cardizem CD 120 mg qd pta, now on BB  Small bowel resection lysis of adhesions - wound VAC in place  AKI - BUN/Cr now normal  For questions or updates, please contact [facility] Please consult [email or web address] for contact info under        Signed, Rosaria Ferries, PA-C  02/07/2019, 7:50 AM    Patient seen and examined with Rosaria Ferries, PA-C.  Agree as above, with the following exceptions and changes as noted below.  Patient is pleasant and in no acute distress.  I observed her ambulating with assistance out of her hospital room. Gen: NAD sitting in bedside chair, CV: Irregular rhythm, tachycardic, no murmurs, Lungs: clear, Abd: soft, Extrem: Warm, well perfused, no edema, Neuro/Psych: alert and oriented x 2, pleasant affect. All available labs, radiology testing, previous records reviewed.  Agree as above with increasing beta-blockade for  better rate control, blood pressure will likely tolerate this.  Holding amiodarone and plan to reinitiate flecainide which patient was taking prior to hospital admission.  Megan Hester 02/07/19 12:56 PM

## 2019-02-07 NOTE — Progress Notes (Signed)
Physical Therapy Treatment Patient Details Name: Lanasha Sylve MRN: [id number] DOB: [birth year] Today's Date: 02/07/2019    History of Present Illness 85 y.o. female with medical history significant of sick sinus syndrome, afib on chronic xarelto, HTN, GERD who presented to the ED with 1 day of abd discomfort and nausea/vomiting.  Pt s/p dx lap with LOA and SBR for closed loop bowel obstruction with early perforation and ischemia with primary umbilical hernia repair by Dr. Johney Maine on 02/01/19    PT Comments    Per RN, pt is more confused today. Pt oriented to self and location.  HR 110s at rest, 140 max with ambulation. Today she ambulated 30' with RW, which is a significant decrease in distance compared to prior PT session during which she ambulated 300'. Ambulation distance limited by nausea today. She also reported dizziness with supine to sit but was not orthostatic (sitting BP was 142/97).  If family is unable to provide 24* care, then pt will need ST-SNF.    Follow Up Recommendations  Supervision/Assistance - 24 hour;SNF     Equipment Recommendations  Rolling walker with 5" wheels    Recommendations for Other Services       Precautions / Restrictions Precautions Precautions: Fall Precaution Comments: monitor HR Restrictions Weight Bearing Restrictions: No    Mobility  Bed Mobility Overal bed mobility: Needs Assistance Bed Mobility: Rolling;Sidelying to Sit Rolling: Min assist Sidelying to sit: Min assist       General bed mobility comments: verbal and tactile cues for rolling, assist for trunk upright and LEs onto bed  Transfers Overall transfer level: Needs assistance Equipment used: Rolling walker (2 wheeled) Transfers: Sit to/from Stand Sit to Stand: Min assist         General transfer comment: verbal cues for hand placement, light assist to rise, also assist for multiple lines; pt reported dizziness with supine to sit, vital signs stable (no  orthostasis)  Ambulation/Gait Ambulation/Gait assistance: Min guard Gait Distance (Feet): 25 Feet Assistive device: Rolling walker (2 wheeled) Gait Pattern/deviations: Step-through pattern;Decreased stride length Gait velocity: decr   General Gait Details: distance limited by nausea, HR 140 max walking, HR 110s at rest   Stairs             Wheelchair Mobility    Modified Rankin (Stroke Patients Only)       Balance Overall balance assessment: Needs assistance         Standing balance support: Bilateral upper extremity supported Standing balance-Leahy Scale: Poor                              Cognition Arousal/Alertness: Awake/alert Behavior During Therapy: WFL for tasks assessed/performed Overall Cognitive Status: Impaired/Different from baseline Area of Impairment: Memory                               General Comments: oriented to self and location      Exercises      General Comments        Pertinent Vitals/Pain Pain Assessment: No/denies pain    Home Living                      Prior Function            PT Goals (current goals can now be found in the care plan section) Acute Rehab PT Goals Patient Stated  Goal: to go home PT Goal Formulation: With patient Time For Goal Achievement: 02/16/19 Potential to Achieve Goals: Fair Progress towards PT goals: Progressing toward goals    Frequency    Min 3X/week      PT Plan Discharge plan needs to be updated    Co-evaluation              AM-PAC PT "6 Clicks" Mobility   Outcome Measure  Help needed turning from your back to your side while in a flat bed without using bedrails?: A Lot Help needed moving from lying on your back to sitting on the side of a flat bed without using bedrails?: A Lot Help needed moving to and from a bed to a chair (including a wheelchair)?: A Little Help needed standing up from a chair using your arms (e.g., wheelchair or  bedside chair)?: A Little Help needed to walk in hospital room?: A Little Help needed climbing 3-5 steps with a railing? : A Lot 6 Click Score: 15    End of Session Equipment Utilized During Treatment: Gait belt Activity Tolerance: Treatment limited secondary to medical complications (Comment)(nausea, HR 140 while ambulating) Patient left: in chair;with chair alarm set;with call bell/phone within reach Nurse Communication: Mobility status(elevated HR with walking) PT Visit Diagnosis: Difficulty in walking, not elsewhere classified (R26.2)     Time: [id number] PT Time Calculation (min) (ACUTE ONLY): 21 min  Charges:  $Gait Training: 8-22 mins                     Blondell Reveal Kistler PT 02/07/2019  Acute Rehabilitation Services Pager [phone] Office [phone]

## 2019-02-07 NOTE — Progress Notes (Signed)
6 Days Post-Op   Subjective/Chief Complaint: Pt pleasant but confused  Denies BM or flatus  Sitter at bedside    Objective: Vital signs in last 24 hours: Temp:  [97.6 F (36.4 C)-98.4 F (36.9 C)] 98 F (36.7 C) (12/28 0744) Pulse Rate:  [111-134] 132 (12/28 0911) Resp:  [16-25] 23 (12/28 0911) BP: (108-160)/(70-108) 109/70 (12/28 0600) SpO2:  [96 %-100 %] 100 % (12/28 0911) Weight:  [80.3 kg] 80.3 kg (12/28 0415) Last BM Date: 02/06/19  Intake/Output from previous day: 12/27 0701 - 12/28 0700 In: 1967.4 [I.V.:1185.8; IV Piggyback:781.7] Out: 301 [Urine:300; Emesis/NG output:1] Intake/Output this shift: No intake/output data recorded. General: WN older WF who is alert                         HEENT: Normal. Pupils equal. .                       Lungs: Clear.                         Abdomen: Soft.  Has BS                         Wound: Has VAC on abdomen   Lab Results:  Recent Labs    02/06/19 0214 02/07/19 0404  WBC 6.5 10.1  HGB 9.1* 9.3*  HCT 28.2* 28.1*  PLT 181 209   BMET Recent Labs    02/06/19 0214 02/07/19 0404  NA 139 140  K 3.0* 4.0  CL 106 109  CO2 24 22  GLUCOSE 169* 103*  BUN 19 19  CREATININE 0.63 0.54  CALCIUM 8.2* 8.1*   PT/INR No results for input(s): LABPROT, INR in the last 72 hours. ABG No results for input(s): PHART, HCO3 in the last 72 hours.  Invalid input(s): PCO2, PO2  Studies/Results: No results found.  Anti-infectives: Anti-infectives (From admission, onward)   Start     Dose/Rate Route Frequency Ordered Stop   02/02/19 0600  meropenem (MERREM) 1 g in sodium chloride 0.9 % 100 mL IVPB     1 g 200 mL/hr over 30 Minutes Intravenous Every 12 hours 02/01/19 2243 02/04/19 0604   02/01/19 1730  clindamycin (CLEOCIN) IVPB 900 mg     900 mg 100 mL/hr over 30 Minutes Intravenous On call to O.R. 02/01/19 1654 02/01/19 2009   02/01/19 1730  gentamicin (GARAMYCIN) 310 mg in dextrose 5 % 100 mL IVPB     5 mg/kg  61.2 kg 107.8  mL/hr over 60 Minutes Intravenous On call to O.R. 02/01/19 1654 02/01/19 1957   02/01/19 1545  meropenem (MERREM) 1 g in sodium chloride 0.9 % 100 mL IVPB     1 g 200 mL/hr over 30 Minutes Intravenous STAT 02/01/19 1533 02/01/19 1724      Assessment/Plan: s/p Procedure(s): LAPAROSCOPY DIAGNOSTIC (N/A) LYSIS OF ADHESION (N/A) LAPAROSCOPIC SMALL BOWEL RESECTION WITH TAP BLOCK (N/A) APPLICATION OF WOUND VAC (Bilateral) 1. LAPAROSCOPY DIAGNOSTIC, LYSIS OF ADHESION, LAPAROSCOPIC SMALL BOWEL RESECTION, APPLICATION OF WOUND VAC - 02/01/2019 - Gross             On Merrem             WBC - 10,100             Wound with VAC - to change MWF  clears but no appetite  Ok to advance diet if she desires but pt not interested  PT /OT ? OK TO PARTICIPATE FROM SURGERY STANDPOINT WHEN ABLE  2.  Sick sinus syndrome 3.  Atrial fibrillation-             on Xarelto/flecainide/Cardizem             Discussed with Dr. Marlou Porch - can switch meds to po for A Fib 4.  Hypertension 5.  Hx right IN nuclear lesion 6.  DVT prophylaxis - on IV heparin 7.  Anemia              Hgb - 9.1 - 02/06/2019 8.  Mildly confused - seems to get worse at night.              Principal Problem:   Closed loop SBO (small bowel obstruction)  Active Problems:   A-fib (HCC)   Hypertension   Sick sinus syndrome (HCC)   Sepsis (HCC)   Chronic anticoagulation   Cardiac pacemaker in place - Medtronic A2DR01 Advisa DR MRI   Rectocele   DM (diabetes mellitus) ([place])   History of adenomatous polyp of colon   Bowel obstruction ([place])   Acute focal ischemia of small intestine ([place])  LOS: 6 days    Marcello Moores A Mariann Palo 02/07/2019

## 2019-02-08 ENCOUNTER — Inpatient Hospital Stay (HOSPITAL_COMMUNITY): Payer: Medicare Other

## 2019-02-08 DIAGNOSIS — E1169 Type 2 diabetes mellitus with other specified complication: Secondary | ICD-10-CM

## 2019-02-08 DIAGNOSIS — I361 Nonrheumatic tricuspid (valve) insufficiency: Secondary | ICD-10-CM

## 2019-02-08 LAB — ECHOCARDIOGRAM COMPLETE
Height: 64 in
Weight: 2276.8 oz

## 2019-02-08 LAB — COMPREHENSIVE METABOLIC PANEL
ALT: 24 U/L (ref 0–44)
AST: 27 U/L (ref 15–41)
Albumin: 2.6 g/dL — ABNORMAL LOW (ref 3.5–5.0)
Alkaline Phosphatase: 81 U/L (ref 38–126)
Anion gap: 11 (ref 5–15)
BUN: 22 mg/dL (ref 8–23)
CO2: 23 mmol/L (ref 22–32)
Calcium: 8.4 mg/dL — ABNORMAL LOW (ref 8.9–10.3)
Chloride: 107 mmol/L (ref 98–111)
Creatinine, Ser: 0.63 mg/dL (ref 0.44–1.00)
GFR calc Af Amer: >60 mL/min (ref >60)
GFR calc non Af Amer: >60 mL/min (ref >60)
Glucose, Bld: 101 mg/dL — ABNORMAL HIGH (ref 70–99)
Potassium: 3.8 mmol/L (ref 3.5–5.1)
Sodium: 141 mmol/L (ref 135–145)
Total Bilirubin: 1.7 mg/dL — ABNORMAL HIGH (ref 0.3–1.2)
Total Protein: 5 g/dL — ABNORMAL LOW (ref 6.5–8.1)

## 2019-02-08 LAB — CBC
HCT: 28.4 % — ABNORMAL LOW (ref 36.0–46.0)
Hemoglobin: 8.9 g/dL — ABNORMAL LOW (ref 12.0–15.0)
MCH: 35.3 pg — ABNORMAL HIGH (ref 26.0–34.0)
MCHC: 31.3 g/dL (ref 30.0–36.0)
MCV: 112.7 fL — ABNORMAL HIGH (ref 80.0–100.0)
Platelets: 224 10*3/uL (ref 150–400)
RBC: 2.52 MIL/uL — ABNORMAL LOW (ref 3.87–5.11)
RDW: 15.1 % (ref 11.5–15.5)
WBC: 7.7 10*3/uL (ref 4.0–10.5)
nRBC: 0.7 % — ABNORMAL HIGH (ref 0.0–0.2)

## 2019-02-08 LAB — MAGNESIUM: Magnesium: 2 mg/dL (ref 1.7–2.4)

## 2019-02-08 MED ORDER — METOCLOPRAMIDE HCL 5 MG/ML IJ SOLN
5.0000 mg | Freq: Three times a day (TID) | INTRAMUSCULAR | Status: AC | PRN
  Administered 2019-02-08 (×2): 5 mg via INTRAVENOUS
  Filled 2019-02-08 (×2): qty 2

## 2019-02-08 MED ORDER — DILTIAZEM HCL 25 MG/5ML IV SOLN
10.0000 mg | Freq: Once | INTRAVENOUS | Status: AC
  Administered 2019-02-08: 10:00:00 10 mg via INTRAVENOUS
  Filled 2019-02-08: qty 5

## 2019-02-08 MED ORDER — DILTIAZEM LOAD VIA INFUSION: 10.0000 mg | Freq: Once | INTRAVENOUS | Status: DC

## 2019-02-08 MED ORDER — METOPROLOL TARTRATE 25 MG PO TABS
25.0000 mg | ORAL_TABLET | Freq: Four times a day (QID) | ORAL | Status: DC
  Administered 2019-02-08: 25 mg via ORAL
  Filled 2019-02-08: qty 1

## 2019-02-08 MED ORDER — DILTIAZEM HCL 30 MG PO TABS
30.0000 mg | ORAL_TABLET | Freq: Four times a day (QID) | ORAL | Status: DC
  Administered 2019-02-08 (×2): 30 mg via ORAL
  Filled 2019-02-08 (×2): qty 1

## 2019-02-08 NOTE — TOC Initial Note (Signed)
Transition of Care [facility]) - Initial/Assessment Note    Patient Details  Name: Megan Hester MRN: [id number] Date of Birth: 26-Aug-1930  Transition of Care [facility]) CM/SW Contact:    Lynnell Catalan, RN Phone Number: 02/08/2019, 1:40 PM  Clinical Narrative:                 This CM spoke with son Richardson Landry for DC planning. Pt lives with son and daughter in law. PT recommendations gone over with Richardson Landry. Richardson Landry states that they would like pt to come back home with them with home health services instead of to SNF. Richardson Landry states that pt husband is using [facility] and they would like to use [facility] as well. CM will contact [facility] to see if they can provide services to pt. Richardson Landry is also requesting a RW and a 3in1. Will need MD orders for DME and HH prior to dc.  Expected Discharge Plan: [place] Barriers to Discharge: Continued Medical Work up   Patient Goals and CMS Choice     Choice offered to / list presented to : Adult Children  Expected Discharge Plan and Services Expected Discharge Plan: [place]   Discharge Planning Services: CM Consult Post Acute Care Choice: [place] arrangements for the past 2 months: Single Family Home                                      Prior Living Arrangements/Services Living arrangements for the past 2 months: Single Family Home Lives with:: Adult Children                   Activities of Daily Living Home Assistive Devices/Equipment: Eyeglasses ADL Screening (condition at time of admission) Patient's cognitive ability adequate to safely complete daily activities?: Yes Is the patient deaf or have difficulty hearing?: No Does the patient have difficulty seeing, even when wearing glasses/contacts?: Yes(difficulty seeing) Does the patient have difficulty concentrating, remembering, or making decisions?: No Patient able to express need for assistance with ADLs?: Yes Does the patient have difficulty  dressing or bathing?: No Independently performs ADLs?: Yes (appropriate for developmental age) Does the patient have difficulty walking or climbing stairs?: No Weakness of Legs: None Weakness of Arms/Hands: None  Permission Sought/Granted                  Emotional Assessment              Admission diagnosis:  Bowel obstruction ([place]) [K56.609] SIRS (systemic inflammatory response syndrome) ([place]) [R65.10] Sepsis ([place]) [A41.9] Intestinal obstruction, unspecified cause, unspecified whether partial or complete ([place]) [K56.609] Acute focal ischemia of small intestine ([place]) [K55.011] Patient Active Problem List   Diagnosis Date Noted  . Closed loop SBO (small bowel obstruction)  02/01/2019  . Sepsis ([place]) 02/01/2019  . Chronic anticoagulation 02/01/2019  . Cardiac pacemaker in place - Medtronic A2DR01 Advisa DR MRI 02/01/2019  . Rectocele 02/01/2019  . DM (diabetes mellitus) ([place]) 02/01/2019  . Bowel obstruction ([place]) 02/01/2019  . Acute focal ischemia of small intestine ([place]) 02/01/2019  . Pseudophakia of left eye 04/03/2016  . Sick sinus syndrome ([place]) 02/25/2016  . Cancer ([place])   . Shortness of breath 11/17/2011  . 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 Nuclear cataract 04/10/2011  . Age-related macular degeneration 12/03/2010  . 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  History of enucleation of right eyeball 12/03/2010  . Hypertension 09/12/2010  . A-fib ([place]) 07/16/2010  . History of adenomatous polyp of colon 01/31/2001   PCP:  [name], MD Pharmacy:   [id number], [place]. [place] [address] Phone: [phone] Fax: [place], [place] [address] Phone: [phone] Fax: [phone]     Social Determinants of  Health (SDOH) Interventions    Readmission Risk Interventions Readmission Risk Prevention Plan 02/08/2019  Transportation Screening Complete  PCP or Specialist Appt within 3-5 Days Complete  HRI or [place] Complete  Social Work Consult for [place] Planning/Counseling Complete  Palliative Care Screening Complete  Medication Review Press photographer) Complete  Some recent data might be hidden

## 2019-02-08 NOTE — Progress Notes (Signed)
Patient ID: Megan Hester, female   DOB: 1930/08/16, 85 y.o.   MRN: [id number]  7 Days Post-Op  Subjective: Complains of nausea  Objective: Vital signs in last 24 hours: Temp:  [97.2 F (36.2 C)-98.3 F (36.8 C)] 98.3 F (36.8 C) (12/29 0400) Pulse Rate:  [116-140] 131 (12/29 0700) Resp:  [17-25] 23 (12/29 0700) BP: (113-142)/(81-110) 121/89 (12/29 0600) SpO2:  [95 %-100 %] 97 % (12/29 0700) Weight:  [85.7 kg] 85.7 kg (12/29 0500) Last BM Date: 02/06/19  Intake/Output from previous day: No intake/output data recorded. Intake/Output this shift: No intake/output data recorded.  General appearance: alert and cooperative Resp: clear to auscultation bilaterally Cardio: regular rate and rhythm and tachy GI: soft, nontender. wound clean  Lab Results:  Recent Labs    02/07/19 0404 02/08/19 0342  WBC 10.1 7.7  HGB 9.3* 8.9*  HCT 28.1* 28.4*  PLT 209 224   BMET Recent Labs    02/07/19 0404 02/08/19 0342  NA 140 141  K 4.0 3.8  CL 109 107  CO2 22 23  GLUCOSE 103* 101*  BUN 19 22  CREATININE 0.54 0.63  CALCIUM 8.1* 8.4*   PT/INR No results for input(s): LABPROT, INR in the last 72 hours. ABG No results for input(s): PHART, HCO3 in the last 72 hours.  Invalid input(s): PCO2, PO2  Studies/Results: No results found.  Anti-infectives: Anti-infectives (From admission, onward)   Start     Dose/Rate Route Frequency Ordered Stop   02/02/19 0600  meropenem (MERREM) 1 g in sodium chloride 0.9 % 100 mL IVPB     1 g 200 mL/hr over 30 Minutes Intravenous Every 12 hours 02/01/19 2243 02/04/19 0604   02/01/19 1730  clindamycin (CLEOCIN) IVPB 900 mg     900 mg 100 mL/hr over 30 Minutes Intravenous On call to O.R. 02/01/19 1654 02/01/19 2009   02/01/19 1730  gentamicin (GARAMYCIN) 310 mg in dextrose 5 % 100 mL IVPB     5 mg/kg  61.2 kg 107.8 mL/hr over 60 Minutes Intravenous On call to O.R. 02/01/19 1654 02/01/19 1957   02/01/19 1545  meropenem (MERREM) 1 g in  sodium chloride 0.9 % 100 mL IVPB     1 g 200 mL/hr over 30 Minutes Intravenous STAT 02/01/19 1533 02/01/19 1724      Assessment/Plan: s/p Procedure(s): LAPAROSCOPY DIAGNOSTIC LYSIS OF ADHESION LAPAROSCOPIC SMALL BOWEL RESECTION WITH TAP BLOCK APPLICATION OF WOUND VAC Continue clears until nausea resolves  Add reglan prn for possible ileus 1. LAPAROSCOPY DIAGNOSTIC, LYSIS OF ADHESION, LAPAROSCOPIC SMALL BOWEL RESECTION, APPLICATION OF WOUND VAC - 02/01/2019 - Gross On Merrem WBC - 7,700 Wound with VAC - to change MWF clears but no appetite  Ok to advance diet if she desires but pt not interested  PT /OT ? OK TO PARTICIPATE FROM SURGERY STANDPOINT WHEN ABLE  2. Sick sinus syndrome 3. Atrial fibrillation- on Xarelto/flecainide/Cardizem Discussed with Dr. Marlou Porch- can switch meds to po for A Fib 4. Hypertension 5. Hx right IN nuclear lesion 6. DVT prophylaxis - on IV heparin 7. Anemia  Hgb - 9.1 - 02/06/2019 8. Mildly confused - seems to get worse at night.  Principal Problem: Closed loop SBO (small bowel obstruction)  Active Problems: A-fib (HCC) Hypertension Sick sinus syndrome (HCC) Sepsis (HCC) Chronic anticoagulation Cardiac pacemaker in place - Medtronic A2DR01 Advisa DR MRI Rectocele DM (diabetes mellitus) ([place]) History of adenomatous polyp of colon Bowel obstruction ([place]) Acute focal ischemia of small intestine (HCC)  LOS: 7 days  Autumn Messing III 02/08/2019

## 2019-02-08 NOTE — Progress Notes (Signed)
  Echocardiogram 2D Echocardiogram has been performed.  Burnett Kanaris 02/08/2019, 11:45 AM

## 2019-02-08 NOTE — Progress Notes (Addendum)
Progress Note  Patient Name: Megan Hester Date of Encounter: 02/08/2019  Primary Cardiologist: Donato Heinz, MD  Electrophysiologist: [name], MD   Subjective   Pt has to think about her name, location, month, but gets them right.  Says she was very busy yesterday, cannot tell me what she did.  No palpitations, no CP or SOB  Inpatient Medications    Scheduled Meds: . bisacodyl  10 mg Rectal Daily  . Chlorhexidine Gluconate Cloth  6 each Topical Daily  . lip balm  1 application Topical BID  . mouth rinse  15 mL Mouth Rinse BID  . metoprolol tartrate  25 mg Oral Once  . metoprolol tartrate  37.5 mg Oral Q6H  . neomycin-polymyxin b-dexamethasone  1 application Right Eye QHS  . polyvinyl alcohol   Right Eye BID  . rivaroxaban  20 mg Oral Q supper  . sodium chloride flush  10-40 mL Intracatheter Q12H  . sodium chloride flush  3 mL Intravenous Once   Continuous Infusions: . famotidine (PEPCID) IV Stopped (02/07/19 2201)  . lactated ringers 50 mL/hr at 02/08/19 0328  . methocarbamol (ROBAXIN) IV    . ondansetron (ZOFRAN) IV     PRN Meds: alum & mag hydroxide-simeth, clobetasol cream, diphenhydrAMINE, enalaprilat, haloperidol lactate, magic mouthwash, menthol-cetylpyridinium, methocarbamol (ROBAXIN) IV, metoprolol tartrate, morphine injection, naphazoline-glycerin, OLANZapine, ondansetron (ZOFRAN) IV **OR** ondansetron (ZOFRAN) IV, phenol, prochlorperazine, simethicone, sodium chloride flush   Vital Signs    Vitals:   02/08/19 0400 02/08/19 0500 02/08/19 0600 02/08/19 0700  BP: (!) 121/91  121/89   Pulse: (!) 131 (!) 116 (!) 128 (!) 131  Resp: (!) 24  19 (!) 23  Temp: 98.3 F (36.8 C)     TempSrc: Axillary     SpO2: 96%  97% 97%  Weight:  85.7 kg    Height:       No intake or output data in the 24 hours ending 02/08/19 0746 Last 3 Weights 02/08/2019 02/07/2019 02/06/2019  Weight (lbs) 189 lb 177 lb 144 lb 2.9 oz  Weight (kg) 85.73 kg 80.287  kg 65.4 kg      Telemetry    Atrial fib, RVR - Personally Reviewed  ECG    02/03/2019-right bundle branch block 104 atrial fibrillation- Personally Reviewed  Physical Exam   General: Well developed, well nourished, elderly female in no acute distress Head: Head normocephalic and atraumatic Lungs: rales bases bilaterally to auscultation, less than yesterday. Heart: Irreg R&R, S1 S2, without rub or gallop. No murmur. 4/4 extremity pulses are 2+ & equal. No JVD. Abdomen: Bowel sounds are present, abdomen soft and non-tender without masses or  hernias noted. Msk: Normal strength and tone for age. Extremities: No clubbing, cyanosis or edema.    Skin:  No rashes or lesions noted. Neuro: Alert and oriented X 3, with effort. Psych:  Good affect, responds appropriately  Labs    High Sensitivity Troponin:  No results for input(s): TROPONINIHS in the last 720 hours.    Chemistry Recent Labs  Lab 02/06/19 0214 02/07/19 0404 02/08/19 0342  NA 139 140 141  K 3.0* 4.0 3.8  CL 106 109 107  CO2 24 22 23   GLUCOSE 169* 103* 101*  BUN 19 19 22   CREATININE 0.63 0.54 0.63  CALCIUM 8.2* 8.1* 8.4*  PROT 5.0* 4.8* 5.0*  ALBUMIN 2.5* 2.5* 2.6*  AST 23 24 27   ALT 17 19 24   ALKPHOS 90 87 81  BILITOT 1.0 2.0* 1.7*  GFRNONAA >  60 >60 >60  GFRAA >60 >60 >60  ANIONGAP 9 9 11      Hematology Recent Labs  Lab 02/06/19 0214 02/07/19 0404 02/08/19 0342  WBC 6.5 10.1 7.7  RBC 2.56*  2.55* 2.52* 2.52*  HGB 9.1* 9.3* 8.9*  HCT 28.2* 28.1* 28.4*  MCV 110.2* 111.5* 112.7*  MCH 35.5* 36.9* 35.3*  MCHC 32.3 33.1 31.3  RDW 14.7 15.0 15.1  PLT 181 209 224   Lab Results  Component Value Date   TSH 1.551 02/07/2019     BNPNo results for input(s): BNP, PROBNP in the last 168 hours.   DDimer No results for input(s): DDIMER in the last 168 hours.   Radiology    No results found.  Cardiac Studies   May 15, 2014 echocardiogram EF 2058/05/15 with aortic root 40 mm  Patient Profile     85 y.o.  female postop laparoscopic diagnostic lysis of adhesions and small bowel resection with paroxysmal atrial fibrillation  Assessment & Plan    Paroxysmal atrial fibrillation - PTA was on Flecainide, stopped because npo - amio d/c'd 12/27, restart Flecainide in 2 weeks - f/u in Afib clinic Jonni Sanger will be on paternity leave) - now back on Xarelto - rate not well controlled>>metoprolol increased to 37.5 mg q 6 hr 12/28 pm, but HR not any lower - BB has not worked well for rate control. - will try Cardizem IV, if successful, will use this - will ck echo to make sure EF is still ok and ck atrial size. - per Dr Rayann Heman, "She is on flecainide.  We will stop this if her arrhythmia burden increases.  She is not interested in additional AADs or ablation." - consider not resuming Flecainide and discussing continuing amio w/ patient. She may be agreeable since the high HR seems to make her tired.  Sick sinus syndrome - PPM PRN  Essential hypertension - pta rx Cardizem CD 120 mg - BP tolerating metoprolol, dose now 37.5 mg q 6 hr  Small bowel resection lysis of adhesions - pod #7 - has wound VAC   For questions or updates, please contact [facility] Please consult [email or web address] for contact info under       Signed, Rosaria Ferries, PA-C  02/08/2019, 7:46 AM    Addendum:  Patient given 10 mg IV of Cardizem.  Her heart rate dropped to less than 100.  Her blood pressure tolerated it. I will start Cardizem at 30 mg every 6 hours, change to her home dose of Cardizem CD 120 mg daily in a.m. if tolerates well.  However, I suspect she will need a higher dose. -Since Cardizem is being used instead of beta-blocker, will check echo to make sure her EF is okay  Rosaria Ferries, PA-C 02/08/2019 10:05 AM Beeper [phone]  Patient seen and examined with Rosaria Ferries PA-C.  Agree as above, with the following exceptions and changes as noted below.  She feels tired today Gen: NAD, tired, CV: Irregular  rhythm and tachycardic, no murmurs, Lungs: clear, Abd: soft, Extrem: Warm, well perfused, no edema, Neuro/Psych: alert and oriented x 3, normal mood and affect. All available labs, radiology testing, previous records reviewed.   Rates are still suboptimally controlled, however after Cardizem this morning they have improved, will continue oral diltiazem 4 times daily and titrate dose as needed.  Agree we will obtain an echocardiogram to evaluate LV function in the setting of diltiazem use.  Continue metoprolol but will reduce the dose to 25 mg every 6 hours, can  likely reduce the dose to twice daily as diltiazem was uptitrated.  We discussed that for now we will continue the plan of reinitiating flecainide however if rates continue to be suboptimally controlled, will discuss amiodarone.  She tells me that when she initially declined ablation this was in the setting of her husband needing additional assistance, however that situation is well handled by her family who is able to help, and if ablation were necessary she would be amenable to this going forward.  Cardiology will continue to follow. [name] 02/08/19 12:16 PM

## 2019-02-08 NOTE — Progress Notes (Signed)
PROGRESS NOTE    Megan Hester  [id number] DOB: 1930-05-13 DOA: 02/01/2019 PCP: [name], MD      Brief Narrative:  Megan Hester is a 85 y.o. F with A. fib, SSS with pacer, on Xarelto, HTN, history DCIS, and enucleation of the right eye who presented with 1 day abdominal pain nausea and vomiting.  In the ER, she was hypotensive, had AKI, and CT of the abdomen and pelvis showed closed-loop obstruction with possible early ischemia.  She underwent urgent surgical intervention with LOA, small bowel resection, and application of wound VAC.    Interim course: Postoperative day she has had some delirium. She is also developed A. fib with RVR, for which cardiology was consulted she was started on amiodarone drip, but this was stopped due to elevated LFTs.  NG tube removed, but poor advancement of diet so far.         Assessment & Plan:  Small bowel obstruction S/p laparoscopic LOA, small bowel resection 12/22 by Dr. Johney Maine Postop day 7, NG tube removed. In the last 24 hours, she has been unable to tolerate oral intake, and has small-volume greenish emesis after oral intake.  There was concern for ileus by general surgery this morning, although I hear bowel sounds. -Wound VAC per general surgery -Advance diet as tolerated -Consult general surgery, appreciate recommendations -Continue IV fluids, close monitoring electrolytes   Chronic atrial fibrillation with RVR Sick sinus syndrome with pacer Had been on flecainide, cannot restart for 2 weeks.  Has been on amiodarone, but LFTs were rising, so we would like to avoid this.  HR has not responded well to beta blockade.   HR 120s overnight -Continue Xarelto -Consult cardiology, patient recommendations -Continue metoprolol for now -Cardiology will attempt diltiazem for now -Hold flecainide  Delirium Post-op delirium expected in 84 yo. Delirium precautions:   -Lights and TV off, minimize interruptions at  night  -Blinds open and lights on during day  -Glasses/hearing aid with patient  -Frequent reorientation  -PT/OT when able  -Avoid sedation medications/Beers list medications   Hypertension BP soft, but stable at present -Continue metoprolol -Trial diltiazem  AKI Cr 1.9 at admission, resolved to 0.6 today, baseline.   Sepsis ruled out Patient presented with SIRS physiology and elevated lactate. There was initial concern for sepsis, She was treated with 3 days of meropenem, but this resolved after small bowel resection, and she has had no further signs of infection, and likely her SIRS was from bowel ischemia.  Acute blood loss anemia Baseline Hgb normal, post-op has been down to ~9 g/dL, stable.  No clinical bleeding.  Macrocytic, B12, TSH and folate normal.   Other medications -Continue eye drops -Resume sertraline at discharge       MDM and disposition: The below labs and imaging reports were reviewed and summarized above.  Medication management as above.  The patient was admitted with SBO, had small bowel resection due to ischemic bowel.  Post-op course complicated be delirium and Afib with RVR.    She remains unable to tolerate PO intake, on IV fluids.  Also with HRs still in 120s all night.  Barriers to discharge: clinical (HR still >120, still unable to take PO) and placement (patient at present recommended for SNF)       DVT prophylaxis: N/A on Xarelto Code Status: FULL Family Communication: Son by phone    Consultants:   Cardiology  General Surgery  Procedures:   12/22 Lap LOA and small bowel resection  Antimicrobials:   Meropenem 12/22 > 12/24   Subjective: Feeling well.  Oriented.  No chest pain, dyspnea.  Has small-volume emesis with food.  Very tired, weak generally.  Objective: Vitals:   02/08/19 0400 02/08/19 0500 02/08/19 0600 02/08/19 0700  BP: (!) 121/91  121/89   Pulse: (!) 131 (!) 116 (!) 128 (!) 131  Resp: (!) 24  19 (!)  23  Temp: 98.3 F (36.8 C)     TempSrc: Axillary     SpO2: 96%  97% 97%  Weight:  85.7 kg    Height:       No intake or output data in the 24 hours ending 02/08/19 0845 Filed Weights   02/06/19 0500 02/07/19 0415 02/08/19 0500  Weight: 65.4 kg 80.3 kg 85.7 kg    Examination: General appearance: Thin elderly adult female, alert and in no acute distress.   HEENT: Anicteric, right eye has been enucleated, left eye conjunctiva pink, lids and lashes normal. No nasal deformity, discharge, epistaxis.  Lips moist, dentition in good repair, oropharynx tacky dry, no oral lesions, hearing normal.   Skin: Warm and dry.  No jaundice.  No suspicious rashes or lesions. Cardiac: Tachycardic, irregular, nl S1-S2, no murmurs appreciated.  Capillary refill is brisk.  JVP normal.  No LE edema.  Radial pulses 2+ and symmetric. Respiratory: Normal respiratory rate and rhythm.  CTAB without rales or wheezes. Abdomen: Abdomen soft.  Mild nonfocal TTP without guarding or rigidity. No ascites, distension, hepatosplenomegaly.  Bowel sounds present. MSK: No deformities or effusions. Neuro: Awake and alert.  EOMI, moves all extremities. Speech fluent.    Psych: Sensorium intact and responding to questions, attention normal. Affect normal.  Judgment and insight appear normal.  Oriented to person, place, time, and situation.  Mild psychomotor slowing noted.  Very minimal.    Data Reviewed: I have personally reviewed following labs and imaging studies:  CBC: Recent Labs  Lab 02/03/19 0900 02/04/19 0650 02/05/19 0407 02/06/19 0214 02/07/19 0404 02/08/19 0342  WBC 10.0 6.6  6.4 5.3 6.5 10.1 7.7  NEUTROABS 8.4* 5.0  4.9  --  4.4  --   --   HGB 10.1* 9.4*  9.2* 10.5* 9.1* 9.3* 8.9*  HCT 31.4* 28.3*  28.1* 33.7* 28.2* 28.1* 28.4*  MCV 114.2* 109.7*  112.0* 115.0* 110.2* 111.5* 112.7*  PLT 163 99*  104* 162 181 209 [id number]   Basic Metabolic Panel: Recent Labs  Lab 02/04/19 0203 02/05/19 0841  02/06/19 0214 02/07/19 0404 02/08/19 0342  NA 141 144 139 140 141  K 4.2 3.4* 3.0* 4.0 3.8  CL 109 109 106 109 107  CO2 22 25 24 22 23   GLUCOSE 81 87 169* 103* 101*  BUN 31* 28* 19 19 22   CREATININE 0.74 0.62 0.63 0.54 0.63  CALCIUM 8.8* 8.7* 8.2* 8.1* 8.4*  MG 2.2 2.0 1.8 1.9 2.0  PHOS  --  2.3* 3.0  --   --    GFR: Estimated Creatinine Clearance: 53.5 mL/min (by C-G formula based on SCr of 0.63 mg/dL). Liver Function Tests: Recent Labs  Lab 02/03/19 0900 02/05/19 0841 02/06/19 0214 02/07/19 0404 02/08/19 0342  AST 23 21 23 24 27   ALT 13 15 17 19 24   ALKPHOS 96 93 90 87 81  BILITOT 1.0 0.8 1.0 2.0* 1.7*  PROT 5.6* 5.2* 5.0* 4.8* 5.0*  ALBUMIN 2.8* 2.6* 2.5* 2.5* 2.6*   Recent Labs  Lab 02/01/19 1056  LIPASE 26   No results for input(s): AMMONIA  in the last 168 hours. Coagulation Profile: Recent Labs  Lab 02/01/19 1638  INR 1.5*   Cardiac Enzymes: No results for input(s): CKTOTAL, CKMB, CKMBINDEX, TROPONINI in the last 168 hours. BNP (last 3 results) No results for input(s): PROBNP in the last 8760 hours. HbA1C: No results for input(s): HGBA1C in the last 72 hours. CBG: No results for input(s): GLUCAP in the last 168 hours. Lipid Profile: No results for input(s): CHOL, HDL, LDLCALC, TRIG, CHOLHDL, LDLDIRECT in the last 72 hours. Thyroid Function Tests: Recent Labs    02/07/19 0404  TSH 1.551  FREET4 1.40*   Anemia Panel: Recent Labs    02/06/19 0214  VITAMINB12 813  FOLATE 16.5  FERRITIN 453*  TIBC 151*  IRON 51  RETICCTPCT 1.0   Urine analysis:    Component Value Date/Time   COLORURINE YELLOW 02/03/2019 0637   APPEARANCEUR CLEAR 02/03/2019 0637   LABSPEC 1.028 02/03/2019 0637   PHURINE 6.0 02/03/2019 0637   GLUCOSEU NEGATIVE 02/03/2019 0637   HGBUR SMALL (A) 02/03/2019 0637   BILIRUBINUR NEGATIVE 02/03/2019 0637   KETONESUR NEGATIVE 02/03/2019 0637   PROTEINUR 30 (A) 02/03/2019 0637   UROBILINOGEN 0.2 08/23/2014 0951   NITRITE  NEGATIVE 02/03/2019 0637   LEUKOCYTESUR NEGATIVE 02/03/2019 0637   Sepsis Labs: @LABRCNTIP (procalcitonin:4,lacticacidven:4)  ) Recent Results (from the past 240 hour(s))  Culture, blood (routine x 2)     Status: None   Collection Time: 02/01/19  4:35 PM   Specimen: BLOOD  Result Value Ref Range Status   Specimen Description   Final    BLOOD RIGHT WRIST Performed at [facility] [address], [place], [place] [address]    Special Requests   Final    BOTTLES DRAWN AEROBIC AND ANAEROBIC Blood Culture adequate volume Performed at [facility] [address], [place], [place] [address]    Culture   Final    NO GROWTH 5 DAYS Performed at [facility], [place] [address], [place], [place] [address]    Report Status 02/06/2019 FINAL  Final  Respiratory Panel by RT PCR (Flu A&B, Covid) - Nasopharyngeal Swab     Status: None   Collection Time: 02/01/19  4:38 PM   Specimen: Nasopharyngeal Swab  Result Value Ref Range Status   SARS Coronavirus 2 by RT PCR NEGATIVE NEGATIVE Final    Comment: (NOTE) SARS-CoV-2 target nucleic acids are NOT DETECTED. The SARS-CoV-2 RNA is generally detectable in upper respiratoy specimens during the acute phase of infection. The lowest concentration of SARS-CoV-2 viral copies this assay can detect is 131 copies/mL. A negative result does not preclude SARS-Cov-2 infection and should not be used as the sole basis for treatment or other patient management decisions. A negative result may occur with  improper specimen collection/handling, submission of specimen other than nasopharyngeal swab, presence of viral mutation(s) within the areas targeted by this assay, and inadequate number of viral copies (<131 copies/mL). A negative result must be combined with clinical observations, patient history, and epidemiological information. The expected result is Negative. Fact Sheet for Patients:   PinkCheek.be Fact Sheet for Healthcare Providers:  GravelBags.it This test is not yet ap proved or cleared by the [place] FDA and  has been authorized for detection and/or diagnosis of SARS-CoV-2 by FDA under an Emergency Use Authorization (EUA). This EUA will remain  in effect (meaning this test can be used) for the duration of the COVID-19 declaration under Section 564(b)(1) of the Act, 21 U.S.C. section  360bbb-3(b)(1), unless the authorization is terminated or revoked sooner.    Influenza A by PCR NEGATIVE NEGATIVE Final   Influenza B by PCR NEGATIVE NEGATIVE Final    Comment: (NOTE) The Xpert Xpress SARS-CoV-2/FLU/RSV assay is intended as an aid in  the diagnosis of influenza from Nasopharyngeal swab specimens and  should not be used as a sole basis for treatment. Nasal washings and  aspirates are unacceptable for Xpert Xpress SARS-CoV-2/FLU/RSV  testing. Fact Sheet for Patients: PinkCheek.be Fact Sheet for Healthcare Providers: GravelBags.it This test is not yet approved or cleared by the [place] FDA and  has been authorized for detection and/or diagnosis of SARS-CoV-2 by  FDA under an Emergency Use Authorization (EUA). This EUA will remain  in effect (meaning this test can be used) for the duration of the  Covid-19 declaration under Section 564(b)(1) of the Act, 21  U.S.C. section 360bbb-3(b)(1), unless the authorization is  terminated or revoked. Performed at [facility], [place] [address] [place], [place] [address]   Culture, blood (routine x 2)     Status: None   Collection Time: 02/01/19  4:51 PM   Specimen: BLOOD  Result Value Ref Range Status   Specimen Description   Final    BLOOD RIGHT ANTECUBITAL Performed at [facility] [address], [place], [place] [address]    Special Requests   Final     BOTTLES DRAWN AEROBIC AND ANAEROBIC Blood Culture adequate volume Performed at [facility] [address], [place], [place] [address]    Culture   Final    NO GROWTH 5 DAYS Performed at [facility], [place] [address], [place], [place] [address]    Report Status 02/06/2019 FINAL  Final  MRSA PCR Screening     Status: None   Collection Time: 02/01/19 10:49 PM   Specimen: Nasal Mucosa; Nasopharyngeal  Result Value Ref Range Status   MRSA by PCR NEGATIVE NEGATIVE Final    Comment:        The GeneXpert MRSA Assay (FDA approved for NASAL specimens only), is one component of a comprehensive MRSA colonization surveillance program. It is not intended to diagnose MRSA infection nor to guide or monitor treatment for MRSA infections. Performed at [facility], [place] [address], [place], [place] [address]          Radiology Studies: No results found.      Scheduled Meds: . bisacodyl  10 mg Rectal Daily  . Chlorhexidine Gluconate Cloth  6 each Topical Daily  . lip balm  1 application Topical BID  . mouth rinse  15 mL Mouth Rinse BID  . metoprolol tartrate  25 mg Oral Once  . metoprolol tartrate  37.5 mg Oral Q6H  . neomycin-polymyxin b-dexamethasone  1 application Right Eye QHS  . polyvinyl alcohol   Right Eye BID  . rivaroxaban  20 mg Oral Q supper  . sodium chloride flush  10-40 mL Intracatheter Q12H  . sodium chloride flush  3 mL Intravenous Once   Continuous Infusions: . famotidine (PEPCID) IV Stopped (02/07/19 2201)  . lactated ringers 50 mL/hr at 02/08/19 0328  . methocarbamol (ROBAXIN) IV    . ondansetron (ZOFRAN) IV       LOS: 7 days    Time spent: 25 minutes    Edwin Dada, MD [facility] 02/08/2019, 8:45 AM     Please page though [facility] or Epic secure chat:  For [employer], contact  charge nurse

## 2019-02-08 NOTE — Progress Notes (Signed)
Physical Therapy Treatment Patient Details Name: Megan Hester MRN: [id number] DOB: 1930-04-25 Today's Date: 02/08/2019    History of Present Illness 85 y.o. female with medical history significant of sick sinus syndrome, afib on chronic xarelto, HTN, GERD who presented to the ED with 1 day of abd discomfort and nausea/vomiting.  Pt s/p dx lap with LOA and SBR for closed loop bowel obstruction with early perforation and ischemia with primary umbilical hernia repair by Dr. Johney Maine on 02/01/19    PT Comments    Assisted pt OOB to [facility] then amb in hallway.  Pt required increased time and rest breaks between each activity. General bed mobility comments: assist OOB and back to bed due to recent ABD surgery.   General transfer comment: assisted from elevated bed to [facility] then from [facility] to walker to amb with 25% VC's on proper hand placement and safety with turns.  General Gait Details: tolerated an increased distance but with MAX c/o weakness/fatigue "I'm so tired" unsteady gait.  Assisted back to bed per pt request to rest.     Follow Up Recommendations  Supervision/Assistance - 24 hour;SNF     Equipment Recommendations  Rolling walker with 5" wheels    Recommendations for Other Services       Precautions / Restrictions Precautions Precautions: Fall Precaution Comments: recent ABD surgery Restrictions Weight Bearing Restrictions: No    Mobility  Bed Mobility Overal bed mobility: Needs Assistance Bed Mobility: Supine to Sit;Sit to Supine   Sidelying to sit: Min assist   Sit to supine: Min assist   General bed mobility comments: assist OOB and back to bed due to recent ABD surgery  Transfers Overall transfer level: Needs assistance Equipment used: Rolling walker (2 wheeled) Transfers: Sit to/from [facility] Sit to Stand: Min assist Stand pivot transfers: Min assist       General transfer comment: assisted from elevated bed to [facility] then from [facility] to walker  to amb with 25% VC's on proper hand placement and safety with turns  Ambulation/Gait Ambulation/Gait assistance: Min guard;Min assist Gait Distance (Feet): 34 Feet Assistive device: Rolling walker (2 wheeled) Gait Pattern/deviations: Step-through pattern;Decreased stride length Gait velocity: decr   General Gait Details: tolerated an increased distance but with MAX c/o weakness/fatigue "I'm so tired" unsteady gait.   Stairs             Wheelchair Mobility    Modified Rankin (Stroke Patients Only)       Balance                                            Cognition Arousal/Alertness: Awake/alert Behavior During Therapy: WFL for tasks assessed/performed Overall Cognitive Status: Impaired/Different from baseline                                 General Comments: AxO x 2 with safety sitter in room      Exercises      General Comments        Pertinent Vitals/Pain Pain Assessment: Faces Faces Pain Scale: Hurts a little bit Pain Location: incision/abdomen Pain Descriptors / Indicators: Sore;Guarding;Discomfort Pain Intervention(s): Monitored during session;Repositioned    Home Living                      Prior Function  PT Goals (current goals can now be found in the care plan section) Progress towards PT goals: Progressing toward goals    Frequency    Min 3X/week      PT Plan Current plan remains appropriate    Co-evaluation              AM-PAC PT "6 Clicks" Mobility   Outcome Measure  Help needed turning from your back to your side while in a flat bed without using bedrails?: A Lot Help needed moving from lying on your back to sitting on the side of a flat bed without using bedrails?: A Lot Help needed moving to and from a bed to a chair (including a wheelchair)?: A Lot Help needed standing up from a chair using your arms (e.g., wheelchair or bedside chair)?: A Lot Help needed to walk in  hospital room?: A Lot Help needed climbing 3-5 steps with a railing? : Total 6 Click Score: 11    End of Session Equipment Utilized During Treatment: Gait belt Activity Tolerance: Patient limited by fatigue Patient left: in bed;with call bell/phone within reach;with bed alarm set;with nursing/sitter in room Nurse Communication: Mobility status PT Visit Diagnosis: Difficulty in walking, not elsewhere classified (R26.2)     Time: 1000-1040 PT Time Calculation (min) (ACUTE ONLY): 40 min  Charges:  $Gait Training: 8-22 mins $Therapeutic Activity: 23-37 mins                     Rica Koyanagi  PTA Acute  Rehabilitation Services Pager      [phone] Office      [phone]

## 2019-02-08 NOTE — Progress Notes (Signed)
Pt has vomited about 224ml of green bile today. More nauseous today than yesterday.

## 2019-02-09 ENCOUNTER — Inpatient Hospital Stay (HOSPITAL_COMMUNITY): Payer: Medicare Other

## 2019-02-09 ENCOUNTER — Inpatient Hospital Stay: Payer: Self-pay

## 2019-02-09 DIAGNOSIS — K565 Intestinal adhesions [bands], unspecified as to partial versus complete obstruction: Secondary | ICD-10-CM

## 2019-02-09 LAB — CBC
HCT: 25.5 % — ABNORMAL LOW (ref 36.0–46.0)
Hemoglobin: 8.1 g/dL — ABNORMAL LOW (ref 12.0–15.0)
MCH: 36 pg — ABNORMAL HIGH (ref 26.0–34.0)
MCHC: 31.8 g/dL (ref 30.0–36.0)
MCV: 113.3 fL — ABNORMAL HIGH (ref 80.0–100.0)
Platelets: UNDETERMINED 10*3/uL (ref 150–400)
RBC: 2.25 MIL/uL — ABNORMAL LOW (ref 3.87–5.11)
RDW: 15.2 % (ref 11.5–15.5)
WBC: 7.1 10*3/uL (ref 4.0–10.5)
nRBC: 0.4 % — ABNORMAL HIGH (ref 0.0–0.2)

## 2019-02-09 LAB — COMPREHENSIVE METABOLIC PANEL
ALT: 20 U/L (ref 0–44)
AST: 22 U/L (ref 15–41)
Albumin: 2.6 g/dL — ABNORMAL LOW (ref 3.5–5.0)
Alkaline Phosphatase: 71 U/L (ref 38–126)
Anion gap: 13 (ref 5–15)
BUN: 27 mg/dL — ABNORMAL HIGH (ref 8–23)
CO2: 22 mmol/L (ref 22–32)
Calcium: 8.2 mg/dL — ABNORMAL LOW (ref 8.9–10.3)
Chloride: 106 mmol/L (ref 98–111)
Creatinine, Ser: 0.57 mg/dL (ref 0.44–1.00)
GFR calc Af Amer: >60 mL/min (ref >60)
GFR calc non Af Amer: >60 mL/min (ref >60)
Glucose, Bld: 98 mg/dL (ref 70–99)
Potassium: 3.6 mmol/L (ref 3.5–5.1)
Sodium: 141 mmol/L (ref 135–145)
Total Bilirubin: 1.7 mg/dL — ABNORMAL HIGH (ref 0.3–1.2)
Total Protein: 4.9 g/dL — ABNORMAL LOW (ref 6.5–8.1)

## 2019-02-09 LAB — GLUCOSE, CAPILLARY
Glucose-Capillary: 104 mg/dL — ABNORMAL HIGH (ref 70–99)
Glucose-Capillary: 110 mg/dL — ABNORMAL HIGH (ref 70–99)

## 2019-02-09 LAB — MAGNESIUM: Magnesium: 2 mg/dL (ref 1.7–2.4)

## 2019-02-09 MED ORDER — RIVAROXABAN 20 MG PO TABS
20.0000 mg | ORAL_TABLET | Freq: Every day | ORAL | Status: DC
  Administered 2019-02-09 – 2019-02-13 (×5): 20 mg
  Filled 2019-02-09 (×5): qty 1

## 2019-02-09 MED ORDER — SODIUM CHLORIDE 0.9% FLUSH: 10.0000 mL | INTRAVENOUS | Status: DC | PRN

## 2019-02-09 MED ORDER — ALUM & MAG HYDROXIDE-SIMETH 200-200-20 MG/5ML PO SUSP
30.0000 mL | Freq: Four times a day (QID) | ORAL | Status: DC | PRN
  Administered 2019-02-13: 22:00:00 30 mL
  Filled 2019-02-09: qty 30

## 2019-02-09 MED ORDER — INSULIN ASPART 100 UNIT/ML ~~LOC~~ SOLN
0.0000 [IU] | SUBCUTANEOUS | Status: DC
  Administered 2019-02-10 (×4): 1 [IU] via SUBCUTANEOUS
  Administered 2019-02-10 – 2019-02-11 (×2): 2 [IU] via SUBCUTANEOUS
  Administered 2019-02-11 – 2019-02-12 (×6): 1 [IU] via SUBCUTANEOUS

## 2019-02-09 MED ORDER — LACTATED RINGERS IV SOLN: INTRAVENOUS | Status: DC

## 2019-02-09 MED ORDER — TRAVASOL 10 % IV SOLN
INTRAVENOUS | Status: AC
  Filled 2019-02-09: qty 480

## 2019-02-09 MED ORDER — DILTIAZEM HCL 25 MG/5ML IV SOLN
5.0000 mg | INTRAVENOUS | Status: AC | PRN
  Administered 2019-02-10 (×2): 5 mg via INTRAVENOUS
  Filled 2019-02-09 (×3): qty 5

## 2019-02-09 MED ORDER — DILTIAZEM HCL 25 MG/5ML IV SOLN
10.0000 mg | Freq: Once | INTRAVENOUS | Status: AC
  Administered 2019-02-09: 01:00:00 10 mg via INTRAVENOUS
  Filled 2019-02-09: qty 5

## 2019-02-09 NOTE — Progress Notes (Addendum)
Progress Note  Patient Name: Megan Hester Date of Encounter: 02/09/2019  Primary Cardiologist: Donato Heinz, MD   Subjective   Continues to be lethargic, having mild abdominal pain. HRs in the 90-100's. Denies palpitations. No SOB. RN notes 3L out of NG tube to suction  Inpatient Medications    Scheduled Meds: . bisacodyl  10 mg Rectal Daily  . Chlorhexidine Gluconate Cloth  6 each Topical Daily  . lip balm  1 application Topical BID  . mouth rinse  15 mL Mouth Rinse BID  . neomycin-polymyxin b-dexamethasone  1 application Right Eye QHS  . polyvinyl alcohol   Right Eye BID  . rivaroxaban  20 mg Per Tube Q supper  . sodium chloride flush  10-40 mL Intracatheter Q12H  . sodium chloride flush  3 mL Intravenous Once   Continuous Infusions: . famotidine (PEPCID) IV Stopped (02/08/19 2130)  . lactated ringers 50 mL/hr at 02/08/19 0328  . methocarbamol (ROBAXIN) IV    . ondansetron (ZOFRAN) IV     PRN Meds: alum & mag hydroxide-simeth, clobetasol cream, diphenhydrAMINE, enalaprilat, haloperidol lactate, magic mouthwash, methocarbamol (ROBAXIN) IV, metoCLOPramide (REGLAN) injection, metoprolol tartrate, morphine injection, naphazoline-glycerin, OLANZapine, ondansetron (ZOFRAN) IV **OR** ondansetron (ZOFRAN) IV, phenol, prochlorperazine, simethicone, sodium chloride flush   Vital Signs    Vitals:   02/09/19 0300 02/09/19 0400 02/09/19 0500 02/09/19 0700  BP: (!) 93/48  (!) 83/52 (!) 97/50  Pulse: 88  99 77  Resp: 17  18 (!) 26  Temp:  98.5 F (36.9 C)    TempSrc:  Axillary    SpO2: 99%  98% 97%  Weight:   65.5 kg   Height:        Intake/Output Summary (Last 24 hours) at 02/09/2019 0732 Last data filed at 02/09/2019 0400 Gross per 24 hour  Intake -  Output 1700 ml  Net -1700 ml   Filed Weights   02/08/19 0500 02/08/19 0913 02/09/19 0500  Weight: 85.7 kg 64.5 kg 65.5 kg   Physical Exam   General: Ill appearing, NAD Neck: No JVD Lungs:Clear  to ausculation bilaterally. No wheezes. Breathing is unlabored. Cardiovascular: Irregularly irregular with S1 S2. No murmur Abdomen: Soft, tender. No obvious abdominal masses. Extremities: No edema. DP pulses 1+ bilaterally Neuro: Alert to person.  No facial asymmetry. MAE spontaneously. Psych: Responds to questions appropriately with normal affect.    Labs    Chemistry Recent Labs  Lab 02/07/19 0404 02/08/19 0342 02/09/19 0500  NA 140 141 141  K 4.0 3.8 3.6  CL 109 107 106  CO2 22 23 22   GLUCOSE 103* 101* 98  BUN 19 22 27*  CREATININE 0.54 0.63 0.57  CALCIUM 8.1* 8.4* 8.2*  PROT 4.8* 5.0* 4.9*  ALBUMIN 2.5* 2.6* 2.6*  AST 24 27 22   ALT 19 24 20   ALKPHOS 87 81 71  BILITOT 2.0* 1.7* 1.7*  GFRNONAA >60 >60 >60  GFRAA >60 >60 >60  ANIONGAP 9 11 13      Hematology Recent Labs  Lab 02/07/19 0404 02/08/19 0342 02/09/19 0500  WBC 10.1 7.7 7.1  RBC 2.52* 2.52* 2.25*  HGB 9.3* 8.9* 8.1*  HCT 28.1* 28.4* 25.5*  MCV 111.5* 112.7* 113.3*  MCH 36.9* 35.3* 36.0*  MCHC 33.1 31.3 31.8  RDW 15.0 15.1 15.2  PLT 209 224 PLATELET CLUMPS NOTED ON SMEAR, UNABLE TO ESTIMATE    Cardiac EnzymesNo results for input(s): TROPONINI in the last 168 hours. No results for input(s): TROPIPOC in the  last 168 hours.   BNPNo results for input(s): BNP, PROBNP in the last 168 hours.   DDimer No results for input(s): DDIMER in the last 168 hours.   Radiology    DG Abd 1 View  Result Date: 02/08/2019 CLINICAL DATA:  Abdominal pain, nausea and vomiting. EXAM: ABDOMEN - 1 VIEW COMPARISON:  CT scan 01/22/2019 FINDINGS: Moderate gaseous distention of the stomach and small bowel is demonstrated consistent with a small-bowel obstruction. Some scattered air is noted in the right colon. IMPRESSION: Persistent small-bowel obstruction bowel gas pattern. No definite free air. Electronically Signed   By: Marijo Sanes M.D.   On: 02/08/2019 16:25   ECHOCARDIOGRAM COMPLETE  Result Date: 02/08/2019    ECHOCARDIOGRAM REPORT   Patient Name:   Megan Hester [facility] Date of Exam: 02/08/2019 Medical Rec #:  [id number]           Height:       64.0 in Accession #:    [id number]          Weight:       142.3 lb Date of Birth:  06-25-1930          BSA:          1.69 m Patient Age:    85 years            BP:           151/84 mmHg Patient Gender: F                   HR:           115 bpm. Exam Location:  Inpatient Procedure: 2D Echo, Cardiac Doppler and Color Doppler Indications:    Atrial Fibrillation 427.31  History:        Patient has prior history of Echocardiogram examinations, most                 recent 01/08/2015. Pacemaker, Arrythmias:Atrial Fibrillation,                 Signs/Symptoms:Shortness of Breath; Risk Factors:Hypertension,                 Diabetes and Non-Smoker. Sepsis.  Sonographer:    Paulita Fujita RDCS Referring Phys: [place]  1. Left ventricular ejection fraction, by visual estimation, is 50 to 55%. The left ventricle has low normal function. Left ventricular septal wall thickness was normal. Normal left ventricular posterior wall thickness. There is no left ventricular hypertrophy.  2. Left ventricular diastolic parameters are indeterminate.  3. The left ventricle has no regional wall motion abnormalities.  4. Global right ventricle has normal systolic function.The right ventricular size is normal. No increase in right ventricular wall thickness.  5. Left atrial size was normal.  6. Right atrial size was normal.  7. The mitral valve is normal in structure. Trivial mitral valve regurgitation. No evidence of mitral stenosis.  8. The tricuspid valve is normal in structure.  9. The aortic valve is tricuspid. Aortic valve regurgitation is not visualized. No evidence of aortic valve sclerosis or stenosis. 10. The pulmonic valve was normal in structure. Pulmonic valve regurgitation is not visualized. 11. Mildly elevated pulmonary artery systolic pressure. 12. A pacer wire is  visualized. 13. The inferior vena cava is normal in size with greater than 50% respiratory variability, suggesting right atrial pressure of 3 mmHg. FINDINGS  Left Ventricle: Left ventricular ejection fraction, by visual estimation, is 50 to 55%. The left ventricle has  low normal function. The left ventricle has no regional wall motion abnormalities. The left ventricular internal cavity size was the left ventricle is normal in size. Normal left ventricular posterior wall thickness. There is no left ventricular hypertrophy. Left ventricular diastolic parameters are indeterminate. Normal left atrial pressure. Right Ventricle: The right ventricular size is normal. No increase in right ventricular wall thickness. Global RV systolic function is has normal systolic function. The tricuspid regurgitant velocity is 2.73 m/s, and with an assumed right atrial pressure  of 3 mmHg, the estimated right ventricular systolic pressure is mildly elevated at 32.8 mmHg. Left Atrium: Left atrial size was normal in size. Right Atrium: Right atrial size was normal in size Pericardium: There is no evidence of pericardial effusion. Mitral Valve: The mitral valve is normal in structure. Trivial mitral valve regurgitation. No evidence of mitral valve stenosis by observation. Tricuspid Valve: The tricuspid valve is normal in structure. Tricuspid valve regurgitation is mild. Aortic Valve: The aortic valve is tricuspid. . There is mild thickening and mild calcification of the aortic valve. Aortic valve regurgitation is not visualized. The aortic valve is structurally normal, with no evidence of sclerosis or stenosis. There is  mild thickening of the aortic valve. There is mild calcification of the aortic valve. Pulmonic Valve: The pulmonic valve was normal in structure. Pulmonic valve regurgitation is not visualized. Pulmonic regurgitation is not visualized. Aorta: The aortic root, ascending aorta and aortic arch are all structurally normal, with  no evidence of dilitation or obstruction. Venous: The inferior vena cava is normal in size with greater than 50% respiratory variability, suggesting right atrial pressure of 3 mmHg. IAS/Shunts: No atrial level shunt detected by color flow Doppler. There is no evidence of a patent foramen ovale. No ventricular septal defect is seen or detected. There is no evidence of an atrial septal defect. Additional Comments: A pacer wire is visualized.  LEFT VENTRICLE PLAX 2D LVIDd:         3.70 cm LVIDs:         3.00 cm LV PW:         0.80 cm LV IVS:        0.80 cm LVOT diam:     1.80 cm LV SV:         23 ml LV SV Index:   13.47 LVOT Area:     2.54 cm  LV Volumes (MOD) LV area d, A2C:    21.60 cm LV area d, A4C:    23.90 cm LV area s, A2C:    14.70 cm LV area s, A4C:    17.30 cm LV major d, A2C:   6.14 cm LV major d, A4C:   7.28 cm LV major s, A2C:   5.04 cm LV major s, A4C:   6.30 cm LV vol d, MOD A2C: 63.0 ml LV vol d, MOD A4C: 66.5 ml LV vol s, MOD A2C: 35.6 ml LV vol s, MOD A4C: 40.6 ml LV SV MOD A2C:     27.4 ml LV SV MOD A4C:     66.5 ml LV SV MOD BP:      27.8 ml RIGHT VENTRICLE TAPSE (M-mode): 2.0 cm LEFT ATRIUM             Index       RIGHT ATRIUM           Index LA diam:        4.60 cm 2.72 cm/m  RA Area:     12.30  cm LA Vol (A2C):   47.0 ml 27.76 ml/m RA Volume:   26.80 ml  15.83 ml/m LA Vol (A4C):   35.4 ml 20.91 ml/m LA Biplane Vol: 41.1 ml 24.28 ml/m  AORTIC VALVE LVOT Vmax:   97.50 cm/s LVOT Vmean:  63.600 cm/s LVOT VTI:    0.164 m  AORTA Ao Root diam: 3.30 cm TRICUSPID VALVE TR Peak grad:   29.8 mmHg TR Vmax:        273.00 cm/s  SHUNTS Systemic VTI:  0.16 m Systemic Diam: 1.80 cm  Skeet Latch MD Electronically signed by Skeet Latch MD Signature Date/Time: 02/08/2019/3:48:15 PM    Final    [place] EKG SITE RITE  Result Date: 02/09/2019 If Site Rite image not attached, placement could not be confirmed due to current cardiac rhythm.  Telemetry    AF with HR 100's- Personally Reviewed  ECG     No new tracing as of 02/09/2019 - Personally Reviewed  Cardiac Studies   Echocardiogram 02/08/2019:   1. Left ventricular ejection fraction, by visual estimation, is 50 to 55%. The left ventricle has low normal function. Left ventricular septal wall thickness was normal. Normal left ventricular posterior wall thickness. There is no left ventricular  hypertrophy.  2. Left ventricular diastolic parameters are indeterminate.  3. The left ventricle has no regional wall motion abnormalities.  4. Global right ventricle has normal systolic function.The right ventricular size is normal. No increase in right ventricular wall thickness.  5. Left atrial size was normal.  6. Right atrial size was normal.  7. The mitral valve is normal in structure. Trivial mitral valve regurgitation. No evidence of mitral stenosis.  8. The tricuspid valve is normal in structure.  9. The aortic valve is tricuspid. Aortic valve regurgitation is not visualized. No evidence of aortic valve sclerosis or stenosis. 10. The pulmonic valve was normal in structure. Pulmonic valve regurgitation is not visualized. 11. Mildly elevated pulmonary artery systolic pressure. 12. A pacer wire is visualized. 13. The inferior vena cava is normal in size with greater than 50% respiratory variability, suggesting right atrial pressure of 3 mmHg  Patient Profile     85 y.o. female with sick sinus syndromes/p PPM, atrial fibrillation on Xarelto, hypertension, GERD who presents with small bowel obstructions/p small bowel resection.She is postop day 2 from her procedure. We are consulted by Dr. Jerry Caras evaluation of atrial fibrillation with RVR.  Assessment & Plan    1.  Paroxysmal atrial fibrillation: -Previously on flecainide with recommendations for holding for 2 weeks therefore this was discontinued>>Heart rates noted to be suboptimal 02/08/2019 therefore she was given diltiazem 10 mg IV. Plan was to start p.o. diltiazem 30 mg  every 6 hours>>however it appears that this was not started in the setting of hypotension  -Echocardiogram performed yesterday with LVEF at 50 to 55%, no regional wall motion abnormalities and no valvular disease -Anticoagulated with Xarelto 20 mg daily -Ablation discussed in the past and patient initially declined however is open to this option if a candidate. -HRs seem to be more controlled today so will defer addition of diltiazem and BB to rounding MD for final decision   2.  Sick sinus syndrome: -PPM in place   3.  Essential hypertension: -Soft BPs today  4.  Small bowel resection with lysis of adhesions: -Postop day 8, NG tube removed -Per primary/surgical teams -3L out of NG tube to suction 12/29  Signed, Kathyrn Drown NP-C [facility] Pager: [phone] 02/09/2019, 7:32 AM  For questions or updates, please contact   Please consult [email or web address] for contact info under Cardiology/STEMI.   Patient seen and examined with Kathyrn Drown NP-C.  Agree as above, with the following exceptions and changes as noted below.  Patient has had significant NG tube output today, and is sleeping on my exam.  Rates are slightly better controlled today but still in the 110s.  Blood pressure stable. Gen: NAD, CV: Irregular rhythm tachycardic, no murmurs, Lungs: clear, Abd: soft, Extrem: Warm, well perfused, no edema, Neuro/Psych: alert and oriented x 3, normal mood and affect. All available labs, radiology testing, previous records reviewed.  Ms. Sklar is still recovering from small bowel resection, now with NG tube.  Her nidus for atrial fibrillation is still present, and she will continue to need IV medications most likely for rate control.  Fortunately she is overall chest pain-free, and primarily is experiencing fatigue.  At this time as needed diltiazem IV is likely the best strategy for rate control.  We will consider reinitiating flecainide when able.  If needed for hemodynamically  significant atrial fibrillation with rapid ventricular response, consider reinitiation of IV amiodarone.  Cardiology will continue to follow along.  Megan Hester 02/09/19 3:59 PM

## 2019-02-09 NOTE — Progress Notes (Signed)
PHARMACY - TOTAL PARENTERAL NUTRITION CONSULT NOTE   Indication: Post-operative ileus  Patient Measurements: Height: 5\' 4"  (162.6 cm) Weight: 144 lb 6.4 oz (65.5 kg) IBW/kg (Calculated) : 54.7 TPN AdjBW (KG): 61.2 Body mass index is 24.79 kg/m.  Recent Labs    02/08/19 0342 02/09/19 0500  NA 141 141  K 3.8 3.6  CL 107 106  CO2 23 22  GLUCOSE 101* 98  BUN 22 27*  CREATININE 0.63 0.57  CALCIUM 8.4* 8.2*  MG 2.0 2.0  ALBUMIN 2.6* 2.6*  ALKPHOS 81 71  AST 27 22  ALT 24 20  BILITOT 1.7* 1.7*  Corr Ca 12/30: 9.3   Intake/Output Summary (Last 24 hours) at 02/09/2019 0824 Last data filed at 02/09/2019 0400 Gross per 24 hour  Intake --  Output 1700 ml  Net -1700 ml    Assessment:   Glucose / Insulin: blood glucose 98 this AM; no dextrose in mIVF  Electrolytes: WNL Renal: SCr WNL, BUN slightly elevated LFTs / TGs: TBili elevated, TG ordered for tomorrow Prealbumin / albumin: Albumin low, prealbumin ordered for tomorrow Intake / Output; MIVF: I/O incomplete; mIVF LR at 50 mL/hr GI Imaging: Abd XR 12/29: "persistent SBO gas pattern" Surgeries / Procedures:  12/22: LAPAROSCOPY DIAGNOSTIC WITH LYSIS OF ADHESION SMALL BOWEL RESECTION TAP BLOCK PRIMARY UMBILICAL HERNIA REPAIR APPLICATION OF WOUND VAC  Central access: PICC line placement ordered by MD for 12/30 TPN start date:  12/30 after PICC placed  Nutritional Goals (RD recommendations pending) 1650 KCal/d, 85 grams protein/d  Goal TPN rate is 70 mL/hr (provides 84 g of protein and 1640 kcals per day)  Current Nutrition:  None (NPO)  Plan:  Start TPN at 40 mL/hr at 1800 if PICC in place Electrolytes in TPN: 31mEq/L of Na, 55mEq/L of K, 67mEq/L of Ca, 32mEq/L of Mg, and 74mmol/L of Phos. Cl:Ac ratio 1:1 Add standard MVI and trace elements to TPN Initiate Sensitive q4h SSI starting at 2000 and adjust as needed  Reduce MIVF to 10 mL/hr at 1800 CMet, Mg, Phos, TG, prealbumin tomorrow Monitor TPN labs on  Mon/Thurs  Clayburn Pert, PharmD, BCPS 02/09/2019  9:13 AM

## 2019-02-09 NOTE — Progress Notes (Signed)
Peripherally Inserted Central Catheter/Midline Placement  The IV Nurse has discussed with the patient and/or persons authorized to consent for the patient, the purpose of this procedure and the potential benefits and risks involved with this procedure.  The benefits include less needle sticks, lab draws from the catheter, and the patient may be discharged home with the catheter. Risks include, but not limited to, infection, bleeding, blood clot (thrombus formation), and puncture of an artery; nerve damage and irregular heartbeat and possibility to perform a PICC exchange if needed/ordered by physician.  Alternatives to this procedure were also discussed.  Bard Power PICC patient education guide, fact sheet on infection prevention and patient information card has been provided to patient /or left at bedside.    PICC/Midline Placement Documentation  PICC Double Lumen [id number] PICC Right Basilic 34 cm 0 cm (Active)  Indication for Insertion or Continuance of Line Administration of hyperosmolar/irritating solutions (i.e. TPN, Vancomycin, etc.) 02/09/19 1050  Exposed Catheter (cm) 0 cm 02/09/19 1050  Site Assessment Clean;Dry;Intact 02/09/19 1050  Lumen #1 Status Flushed;Saline locked;Blood return noted 02/09/19 1050  Lumen #2 Status Flushed;Saline locked;Blood return noted 02/09/19 1050  Dressing Type Transparent;Securing device 02/09/19 1050  Dressing Status Clean;Dry;Intact;Antimicrobial disc in place 02/09/19 1050  Dressing Intervention New dressing 02/09/19 1050  Dressing Change Due 02/16/19 02/09/19 [place] 02/09/2019, 10:52 AM

## 2019-02-09 NOTE — Progress Notes (Signed)
PROGRESS NOTE    Megan Hester  [id number] DOB: March 28, 1930 DOA: 02/01/2019 PCP: [name], MD    Brief Narrative:  85 y.o. F with A. fib, SSS with pacer, on Xarelto, HTN, history DCIS, and enucleation of the right eye who presented with 1 day abdominal pain nausea and vomiting.  In the ER, she was hypotensive, had AKI, and CT of the abdomen and pelvis showed closed-loop obstruction with possible early ischemia.  She underwent urgent surgical intervention with LOA, small bowel resection, and application of wound VAC.  Interim course: Postoperative day she has had some delirium. She is also developed A. fib with RVR, for which cardiology was consulted she was started on amiodarone drip, but this was stopped due to elevated LFTs.   Assessment & Plan:   Principal Problem:   Closed loop SBO (small bowel obstruction)  Active Problems:   A-fib (HCC)   Hypertension   Sick sinus syndrome (HCC)   Sepsis (HCC)   Chronic anticoagulation   Cardiac pacemaker in place - Med[employer]onic [id number] Advisa DR MRI   Rectocele   DM (diabetes mellitus) ([place])   History of adenomatous polyp of colon   Bowel obstruction (HCC)   Acute focal ischemia of small intestine (HCC)  Small bowel obstruction S/p laparoscopic LOA, small bowel resection 12/22 by Dr. Johney Maine Postop day 8, NG tube removed. In the last 24 hours, she has been unable to tolerate oral intake, and has small-volume greenish emesis after oral intake.  There was concern for ileus by general surgery, now with NG in place as of 12/29 -Wound VAC per general surgery -Started TNA per General Surgery -Repeat bmet in AM  Chronic atrial fibrillation with RVR Sick sinus syndrome with pacer Had been on flecainide, cannot restart for 2 weeks.  Has been on amiodarone, but LFTs were rising, so we would like to avoid this.  HR has not responded well to beta blockade.   HR 120s overnight -Continue Xarelto. Per Surgery, pt can take PO  cardiac meds -Cardiology following. Issues with hypotension thus for now, recommendations for PRN cardizem IV for rate control for the time being and consider reinitiating flecainide when able to  -for hemodynamically significant afib with RVR, then consider starting amiodarone IV  Delirium -Post-op delirium expected in 85 yo. -continue with delirium precautions:              -Lights and TV off, minimize interruptions at night             -Blinds open and lights on during day             -Glasses/hearing aid with patient             -Frequent reorientation             -PT/OT when able             -Avoid sedation medications/Beers list medications   Hypertension -BP remains soft -Continue metoprolol PRN -see above, plan for PRN IV cardizem for rate control if needed  AKI -Cr 1.9 at admission -Now resolved, repeat bmet in AM  Sepsis ruled out -Patient presented with SIRS physiology and elevated lactate. There was initial concern for sepsis, She was treated with 3 days of meropenem, but this resolved after small bowel resection, and she has had no further signs of infection, and likely her SIRS was from bowel ischemia. -Stable at this time  Acute blood loss anemia Baseline Hgb normal, post-op has been down to ~  9 g/dL, stable.  No clinical bleeding.  Macrocytic, B12, TSH and folate normal. -Recheck CBC in AM   Other medications -Continue eye drops -Anticipate resuming sertraline at time of discharge   DVT prophylaxis: Xarelto Code Status: Full Family Communication: Pt in room, family not at bedside Disposition Plan: Uncertain at this time  Consultants:   Cardiology  General Surgery  Procedures:   12/22 Lap LOA and small bowel resection  Antimicrobials: Anti-infectives (From admission, onward)   Start     Dose/Rate Route Frequency Ordered Stop   02/02/19 0600  meropenem (MERREM) 1 g in sodium chloride 0.9 % 100 mL IVPB     1 g 200 mL/hr over 30 Minutes  Intravenous Every 12 hours 02/01/19 2243 02/04/19 0604   02/01/19 1730  clindamycin (CLEOCIN) IVPB 900 mg     900 mg 100 mL/hr over 30 Minutes Intravenous On call to O.R. 02/01/19 1654 02/01/19 2009   02/01/19 1730  gentamicin (GARAMYCIN) 310 mg in dextrose 5 % 100 mL IVPB     5 mg/kg  61.2 kg 107.8 mL/hr over 60 Minutes Intravenous On call to O.R. 02/01/19 1654 02/01/19 1957   02/01/19 1545  meropenem (MERREM) 1 g in sodium chloride 0.9 % 100 mL IVPB     1 g 200 mL/hr over 30 Minutes Intravenous STAT 02/01/19 1533 02/01/19 1724       Subjective: Unable to assess given current mentation  Objective: Vitals:   02/09/19 1436 02/09/19 1500 02/09/19 1600 02/09/19 1700  BP: 103/60 (!) 115/57 106/67 (!) 97/58  Pulse:  (!) 104 (!) 113 (!) 123  Resp: (!) 23 17 20 16   Temp:   98.1 F (36.7 C)   TempSrc:   Oral   SpO2:  96% 96% 97%  Weight:      Height:        Intake/Output Summary (Last 24 hours) at 02/09/2019 1754 Last data filed at 02/09/2019 1500 Gross per 24 hour  Intake 3014.84 ml  Output 1700 ml  Net 1314.84 ml   Filed Weights   02/08/19 0500 02/08/19 0913 02/09/19 0500  Weight: 85.7 kg 64.5 kg 65.5 kg    Examination:  General exam: Appears calm and comfortable  Respiratory system: Clear to auscultation. Respiratory effort normal. Cardiovascular system: S1 & S2 heard, Regular Gastrointestinal system: Abdomen is nondistended, soft and nontender. No organomegaly or masses felt. Normal bowel sounds heard. Central nervous system: No tremors. No focal neurological deficits. Extremities: Symmetric 5 x 5 power. Skin: No rashes, lesions Psychiatry: Unable to assess given current mentation   Data Reviewed: I have personally reviewed following labs and imaging studies  CBC: Recent Labs  Lab 02/03/19 0900 02/04/19 0650 02/05/19 0407 02/06/19 0214 02/07/19 0404 02/08/19 0342 02/09/19 0500  WBC 10.0 6.6  6.4 5.3 6.5 10.1 7.7 7.1  NEUTROABS 8.4* 5.0  4.9  --  4.4   --   --   --   HGB 10.1* 9.4*  9.2* 10.5* 9.1* 9.3* 8.9* 8.1*  HCT 31.4* 28.3*  28.1* 33.7* 28.2* 28.1* 28.4* 25.5*  MCV 114.2* 109.7*  112.0* 115.0* 110.2* 111.5* 112.7* 113.3*  PLT 163 99*  104* 162 181 209 224 PLATELET CLUMPS NOTED ON SMEAR, UNABLE TO ESTIMATE   Basic Metabolic Panel: Recent Labs  Lab 02/05/19 0841 02/06/19 0214 02/07/19 0404 02/08/19 0342 02/09/19 0500  NA 144 139 140 141 141  K 3.4* 3.0* 4.0 3.8 3.6  CL 109 106 109 107 106  CO2 25 24 22 23  22  GLUCOSE 87 169* 103* 101* 98  BUN 28* 19 19 22  27*  CREATININE 0.62 0.63 0.54 0.63 0.57  CALCIUM 8.7* 8.2* 8.1* 8.4* 8.2*  MG 2.0 1.8 1.9 2.0 2.0  PHOS 2.3* 3.0  --   --   --    GFR: Estimated Creatinine Clearance: 43.6 mL/min (by C-G formula based on SCr of 0.57 mg/dL). Liver Function Tests: Recent Labs  Lab 02/05/19 0841 02/06/19 0214 02/07/19 0404 02/08/19 0342 02/09/19 0500  AST 21 23 24 27 22   ALT 15 17 19 24 20   ALKPHOS 93 90 87 81 71  BILITOT 0.8 1.0 2.0* 1.7* 1.7*  PROT 5.2* 5.0* 4.8* 5.0* 4.9*  ALBUMIN 2.6* 2.5* 2.5* 2.6* 2.6*   No results for input(s): LIPASE, AMYLASE in the last 168 hours. No results for input(s): AMMONIA in the last 168 hours. Coagulation Profile: No results for input(s): INR, PROTIME in the last 168 hours. Cardiac Enzymes: No results for input(s): CKTOTAL, CKMB, CKMBINDEX, TROPONINI in the last 168 hours. BNP (last 3 results) No results for input(s): PROBNP in the last 8760 hours. HbA1C: No results for input(s): HGBA1C in the last 72 hours. CBG: No results for input(s): GLUCAP in the last 168 hours. Lipid Profile: No results for input(s): CHOL, HDL, LDLCALC, TRIG, CHOLHDL, LDLDIRECT in the last 72 hours. Thyroid Function Tests: Recent Labs    02/07/19 0404  TSH 1.551  FREET4 1.40*   Anemia Panel: No results for input(s): VITAMINB12, FOLATE, FERRITIN, TIBC, IRON, RETICCTPCT in the last 72 hours. Sepsis Labs: No results for input(s): PROCALCITON, LATICACIDVEN  in the last 168 hours.  Recent Results (from the past 240 hour(s))  Culture, blood (routine x 2)     Status: None   Collection Time: 02/01/19  4:35 PM   Specimen: BLOOD  Result Value Ref Range Status   Specimen Description   Final    BLOOD RIGHT WRIST Performed at [facility] [address], [place], [place] [address]    Special Requests   Final    BOTTLES DRAWN AEROBIC AND ANAEROBIC Blood Culture adequate volume Performed at [facility] [address] [place], [place] [address]    Culture   Final    NO GROWTH 5 DAYS Performed at [facility], [place] [address] [place], [place] [address]    Report Status 02/06/2019 FINAL  Final  Respiratory Panel by RT PCR (Flu A&B, Covid) - Nasopharyngeal Swab     Status: None   Collection Time: 02/01/19  4:38 PM   Specimen: Nasopharyngeal Swab  Result Value Ref Range Status   SARS Coronavirus 2 by RT PCR NEGATIVE NEGATIVE Final    Comment: (NOTE) SARS-CoV-2 target nucleic acids are NOT DETECTED. The SARS-CoV-2 RNA is generally detectable in upper respiratoy specimens during the acute phase of infection. The lowest concentration of SARS-CoV-2 viral copies this assay can detect is 131 copies/mL. A negative result does not preclude SARS-Cov-2 infection and should not be used as the sole basis for treatment or other patient management decisions. A negative result may occur with  improper specimen collection/handling, submission of specimen other than nasopharyngeal swab, presence of viral mutation(s) within the areas targeted by this assay, and inadequate number of viral copies (<131 copies/mL). A negative result must be combined with clinical observations, patient history, and epidemiological information. The expected result is Negative. Fact Sheet for Patients:  PinkCheek.be Fact Sheet for Healthcare Providers:  GravelBags.it This test is  not yet ap proved or  cleared by the [place] and  has been authorized for detection and/or diagnosis of SARS-CoV-2 by FDA under an Emergency Use Authorization (EUA). This EUA will remain  in effect (meaning this test can be used) for the duration of the COVID-19 declaration under Section 564(b)(1) of the Act, 21 U.S.C. section 360bbb-3(b)(1), unless the authorization is terminated or revoked sooner.    Influenza A by PCR NEGATIVE NEGATIVE Final   Influenza B by PCR NEGATIVE NEGATIVE Final    Comment: (NOTE) The Xpert Xpress SARS-CoV-2/FLU/RSV assay is intended as an aid in  the diagnosis of influenza from Nasopharyngeal swab specimens and  should not be used as a sole basis for treatment. Nasal washings and  aspirates are unacceptable for Xpert Xpress SARS-CoV-2/FLU/RSV  testing. Fact Sheet for Patients: PinkCheek.be Fact Sheet for Healthcare Providers: GravelBags.it This test is not yet approved or cleared by the [place] FDA and  has been authorized for detection and/or diagnosis of SARS-CoV-2 by  FDA under an Emergency Use Authorization (EUA). This EUA will remain  in effect (meaning this test can be used) for the duration of the  Covid-19 declaration under Section 564(b)(1) of the Act, 21  U.S.C. section 360bbb-3(b)(1), unless the authorization is  terminated or revoked. Performed at [facility], [place] [address] [place], [place] [address]   Culture, blood (routine x 2)     Status: None   Collection Time: 02/01/19  4:51 PM   Specimen: BLOOD  Result Value Ref Range Status   Specimen Description   Final    BLOOD RIGHT ANTECUBITAL Performed at [facility] [address] [place], [place] [address]    Special Requests   Final    BOTTLES DRAWN AEROBIC AND ANAEROBIC Blood Culture adequate volume Performed at [facility] [address],  [place], [place] [address]    Culture   Final    NO GROWTH 5 DAYS Performed at [facility], [place] [address], [place], [place] [address]    Report Status 02/06/2019 FINAL  Final  MRSA PCR Screening     Status: None   Collection Time: 02/01/19 10:49 PM   Specimen: Nasal Mucosa; Nasopharyngeal  Result Value Ref Range Status   MRSA by PCR NEGATIVE NEGATIVE Final    Comment:        The GeneXpert MRSA Assay (FDA approved for NASAL specimens only), is one component of a comprehensive MRSA colonization surveillance program. It is not intended to diagnose MRSA infection nor to guide or monitor treatment for MRSA infections. Performed at [facility], [place] [address], [place],  [address]      Radiology Studies: DG Abd 1 View  Result Date: 02/08/2019 CLINICAL DATA:  Abdominal pain, nausea and vomiting. EXAM: ABDOMEN - 1 VIEW COMPARISON:  CT scan 01/22/2019 FINDINGS: Moderate gaseous distention of the stomach and small bowel is demonstrated consistent with a small-bowel obstruction. Some scattered air is noted in the right colon. IMPRESSION: Persistent small-bowel obstruction bowel gas pattern. No definite free air. Electronically Signed   By: Marijo Sanes M.D.   On: 02/08/2019 16:25   DG CHEST PORT 1 VIEW  Result Date: 02/09/2019 CLINICAL DATA:  Status post PICC line insertion. EXAM: PORTABLE CHEST 1 VIEW COMPARISON:  02/01/2019 FINDINGS: There is a left chest wall pacer device with lead in the right atrial appendage and right ventricle. An NG tube is identified with tip below the field of view. Right arm PICC line  has been placed the tip terminates over the distal SVC. Heart size appears normal. Small right pleural effusion identified. No airspace opacities. Chronic deformity involving the proximal right humerus noted. Advanced degenerative changes are noted at both glenohumeral joints. IMPRESSION: 1. Satisfactory position of right arm PICC line. 2. Small right  pleural effusion. Electronically Signed   By: Kerby Moors M.D.   On: 02/09/2019 11:41   ECHOCARDIOGRAM COMPLETE  Result Date: 02/08/2019   ECHOCARDIOGRAM REPORT   Patient Name:   Megan Hester [facility] Date of Exam: 02/08/2019 Medical Rec #:  [id number]           Height:       64.0 in Accession #:    [id number]          Weight:       142.3 lb Date of Birth:  03-Dec-1930          BSA:          1.69 m Patient Age:    86 years            BP:           151/84 mmHg Patient Gender: F                   HR:           115 bpm. Exam Location:  Inpatient Procedure: 2D Echo, Cardiac Doppler and Color Doppler Indications:    Atrial Fibrillation 427.31  History:        Patient has prior history of Echocardiogram examinations, most                 recent 01/08/2015. Pacemaker, Arrythmias:Atrial Fibrillation,                 Signs/Symptoms:Shortness of Breath; Risk Factors:Hypertension,                 Diabetes and Non-Smoker. Sepsis.  Sonographer:    Paulita Fujita RDCS Referring Phys: [place]  1. Left ventricular ejection fraction, by visual estimation, is 50 to 55%. The left ventricle has low normal function. Left ventricular septal wall thickness was normal. Normal left ventricular posterior wall thickness. There is no left ventricular hypertrophy.  2. Left ventricular diastolic parameters are indeterminate.  3. The left ventricle has no regional wall motion abnormalities.  4. Global right ventricle has normal systolic function.The right ventricular size is normal. No increase in right ventricular wall thickness.  5. Left atrial size was normal.  6. Right atrial size was normal.  7. The mitral valve is normal in structure. Trivial mitral valve regurgitation. No evidence of mitral stenosis.  8. The tricuspid valve is normal in structure.  9. The aortic valve is tricuspid. Aortic valve regurgitation is not visualized. No evidence of aortic valve sclerosis or stenosis. 10. The pulmonic valve was  normal in structure. Pulmonic valve regurgitation is not visualized. 11. Mildly elevated pulmonary artery systolic pressure. 12. A pacer wire is visualized. 13. The inferior vena cava is normal in size with greater than 50% respiratory variability, suggesting right atrial pressure of 3 mmHg. FINDINGS  Left Ventricle: Left ventricular ejection fraction, by visual estimation, is 50 to 55%. The left ventricle has low normal function. The left ventricle has no regional wall motion abnormalities. The left ventricular internal cavity size was the left ventricle is normal in size. Normal left ventricular posterior wall thickness. There is no left ventricular hypertrophy. Left ventricular diastolic parameters are indeterminate. Normal  left atrial pressure. Right Ventricle: The right ventricular size is normal. No increase in right ventricular wall thickness. Global RV systolic function is has normal systolic function. The tricuspid regurgitant velocity is 2.73 m/s, and with an assumed right atrial pressure  of 3 mmHg, the estimated right ventricular systolic pressure is mildly elevated at 32.8 mmHg. Left Atrium: Left atrial size was normal in size. Right Atrium: Right atrial size was normal in size Pericardium: There is no evidence of pericardial effusion. Mitral Valve: The mitral valve is normal in structure. Trivial mitral valve regurgitation. No evidence of mitral valve stenosis by observation. Tricuspid Valve: The tricuspid valve is normal in structure. Tricuspid valve regurgitation is mild. Aortic Valve: The aortic valve is tricuspid. . There is mild thickening and mild calcification of the aortic valve. Aortic valve regurgitation is not visualized. The aortic valve is structurally normal, with no evidence of sclerosis or stenosis. There is  mild thickening of the aortic valve. There is mild calcification of the aortic valve. Pulmonic Valve: The pulmonic valve was normal in structure. Pulmonic valve regurgitation is  not visualized. Pulmonic regurgitation is not visualized. Aorta: The aortic root, ascending aorta and aortic arch are all structurally normal, with no evidence of dilitation or obstruction. Venous: The inferior vena cava is normal in size with greater than 50% respiratory variability, suggesting right atrial pressure of 3 mmHg. IAS/Shunts: No atrial level shunt detected by color flow Doppler. There is no evidence of a patent foramen ovale. No ventricular septal defect is seen or detected. There is no evidence of an atrial septal defect. Additional Comments: A pacer wire is visualized.  LEFT VENTRICLE PLAX 2D LVIDd:         3.70 cm LVIDs:         3.00 cm LV PW:         0.80 cm LV IVS:        0.80 cm LVOT diam:     1.80 cm LV SV:         23 ml LV SV Index:   13.47 LVOT Area:     2.54 cm  LV Volumes (MOD) LV area d, A2C:    21.60 cm LV area d, A4C:    23.90 cm LV area s, A2C:    14.70 cm LV area s, A4C:    17.30 cm LV major d, A2C:   6.14 cm LV major d, A4C:   7.28 cm LV major s, A2C:   5.04 cm LV major s, A4C:   6.30 cm LV vol d, MOD A2C: 63.0 ml LV vol d, MOD A4C: 66.5 ml LV vol s, MOD A2C: 35.6 ml LV vol s, MOD A4C: 40.6 ml LV SV MOD A2C:     27.4 ml LV SV MOD A4C:     66.5 ml LV SV MOD BP:      27.8 ml RIGHT VENTRICLE TAPSE (M-mode): 2.0 cm LEFT ATRIUM             Index       RIGHT ATRIUM           Index LA diam:        4.60 cm 2.72 cm/m  RA Area:     12.30 cm LA Vol (A2C):   47.0 ml 27.76 ml/m RA Volume:   26.80 ml  15.83 ml/m LA Vol (A4C):   35.4 ml 20.91 ml/m LA Biplane Vol: 41.1 ml 24.28 ml/m  AORTIC VALVE LVOT Vmax:   97.50 cm/s LVOT  Vmean:  63.600 cm/s LVOT VTI:    0.164 m  AORTA Ao Root diam: 3.30 cm TRICUSPID VALVE TR Peak grad:   29.8 mmHg TR Vmax:        273.00 cm/s  SHUNTS Systemic VTI:  0.16 m Systemic Diam: 1.80 cm  Skeet Latch MD Electronically signed by Skeet Latch MD Signature Date/Time: 02/08/2019/3:48:15 PM    Final    [place] EKG SITE RITE  Result Date: 02/09/2019 If Site  Rite image not attached, placement could not be confirmed due to current cardiac rhythm.   Scheduled Meds: . bisacodyl  10 mg Rectal Daily  . Chlorhexidine Gluconate Cloth  6 each Topical Daily  . insulin aspart  0-9 Units Subcutaneous Q4H  . lip balm  1 application Topical BID  . mouth rinse  15 mL Mouth Rinse BID  . neomycin-polymyxin b-dexamethasone  1 application Right Eye QHS  . polyvinyl alcohol   Right Eye BID  . rivaroxaban  20 mg Per Tube Q supper  . sodium chloride flush  10-40 mL Intracatheter Q12H  . sodium chloride flush  3 mL Intravenous Once   Continuous Infusions: . famotidine (PEPCID) IV Stopped (02/09/19 1228)  . lactated ringers Stopped (02/09/19 1037)  . lactated ringers    . methocarbamol (ROBAXIN) IV    . ondansetron (ZOFRAN) IV    . TPN ADULT (ION) 40 mL/hr at 02/09/19 1717     LOS: 8 days   Marylu Lund, MD [facility] Pager On Amion  If 7PM-7AM, please contact night-coverage 02/09/2019, 5:54 PM

## 2019-02-09 NOTE — Progress Notes (Signed)
OT Cancellation Note  Patient Details Name: Megan Hester MRN: [id number] DOB: 05-31-1930   Cancelled Treatment:    Reason Eval/Treat Not Completed: Other (comment).  Spoke to RN; pt not feeling good and finally sleeping.  Will check back later today if schedule permits  Skylynne Schlechter 02/09/2019, 9:08 AM  Karsten Ro, OTR/L Acute Rehabilitation Services 02/09/2019

## 2019-02-09 NOTE — Progress Notes (Signed)
8 Days Post-Op   Subjective/Chief Complaint: Developed N/V yesterday NGT placed  Sleepy this am    Objective: Vital signs in last 24 hours: Temp:  [97.4 F (36.3 C)-98.9 F (37.2 C)] 98.5 F (36.9 C) (12/30 0400) Pulse Rate:  [54-137] 77 (12/30 0700) Resp:  [17-30] 26 (12/30 0700) BP: (83-151)/(48-99) 97/50 (12/30 0700) SpO2:  [94 %-99 %] 97 % (12/30 0700) Weight:  [64.5 kg-65.5 kg] 65.5 kg (12/30 0500) Last BM Date: 02/07/19  Intake/Output from previous day: 12/29 0701 - 12/30 0700 In: -  Out: 1700 [Emesis/NG output:1700] Intake/Output this shift: No intake/output data recorded.  General appearance: sleepy sedated  Resp: clear to auscultation bilaterally Cardio: regular rate and rhythm and tachy GI: soft, open clean soft NT ND   Lab Results:  Recent Labs    02/08/19 0342 02/09/19 0500  WBC 7.7 7.1  HGB 8.9* 8.1*  HCT 28.4* 25.5*  PLT 224 PLATELET CLUMPS NOTED ON SMEAR, UNABLE TO ESTIMATE   BMET Recent Labs    02/08/19 0342 02/09/19 0500  NA 141 141  K 3.8 3.6  CL 107 106  CO2 23 22  GLUCOSE 101* 98  BUN 22 27*  CREATININE 0.63 0.57  CALCIUM 8.4* 8.2*   PT/INR No results for input(s): LABPROT, INR in the last 72 hours. ABG No results for input(s): PHART, HCO3 in the last 72 hours.  Invalid input(s): PCO2, PO2  Studies/Results: DG Abd 1 View  Result Date: 02/08/2019 CLINICAL DATA:  Abdominal pain, nausea and vomiting. EXAM: ABDOMEN - 1 VIEW COMPARISON:  CT scan 01/22/2019 FINDINGS: Moderate gaseous distention of the stomach and small bowel is demonstrated consistent with a small-bowel obstruction. Some scattered air is noted in the right colon. IMPRESSION: Persistent small-bowel obstruction bowel gas pattern. No definite free air. Electronically Signed   By: Marijo Sanes M.D.   On: 02/08/2019 16:25   ECHOCARDIOGRAM COMPLETE  Result Date: 02/08/2019   ECHOCARDIOGRAM REPORT   Patient Name:   Megan Hester [facility] Date of Exam: 02/08/2019 Medical  Rec #:  [id number]           Height:       64.0 in Accession #:    [id number]          Weight:       142.3 lb Date of Birth:  1930-08-12          BSA:          1.69 m Patient Age:    85 years            BP:           151/84 mmHg Patient Gender: F                   HR:           115 bpm. Exam Location:  Inpatient Procedure: 2D Echo, Cardiac Doppler and Color Doppler Indications:    Atrial Fibrillation 427.31  History:        Patient has prior history of Echocardiogram examinations, most                 recent 01/08/2015. Pacemaker, Arrythmias:Atrial Fibrillation,                 Signs/Symptoms:Shortness of Breath; Risk Factors:Hypertension,                 Diabetes and Non-Smoker. Sepsis.  Sonographer:    Paulita Fujita RDCS Referring Phys: [place]  1. Left ventricular ejection fraction, by visual estimation, is 50 to 55%. The left ventricle has low normal function. Left ventricular septal wall thickness was normal. Normal left ventricular posterior wall thickness. There is no left ventricular hypertrophy.  2. Left ventricular diastolic parameters are indeterminate.  3. The left ventricle has no regional wall motion abnormalities.  4. Global right ventricle has normal systolic function.The right ventricular size is normal. No increase in right ventricular wall thickness.  5. Left atrial size was normal.  6. Right atrial size was normal.  7. The mitral valve is normal in structure. Trivial mitral valve regurgitation. No evidence of mitral stenosis.  8. The tricuspid valve is normal in structure.  9. The aortic valve is tricuspid. Aortic valve regurgitation is not visualized. No evidence of aortic valve sclerosis or stenosis. 10. The pulmonic valve was normal in structure. Pulmonic valve regurgitation is not visualized. 11. Mildly elevated pulmonary artery systolic pressure. 12. A pacer wire is visualized. 13. The inferior vena cava is normal in size with greater than 50% respiratory  variability, suggesting right atrial pressure of 3 mmHg. FINDINGS  Left Ventricle: Left ventricular ejection fraction, by visual estimation, is 50 to 55%. The left ventricle has low normal function. The left ventricle has no regional wall motion abnormalities. The left ventricular internal cavity size was the left ventricle is normal in size. Normal left ventricular posterior wall thickness. There is no left ventricular hypertrophy. Left ventricular diastolic parameters are indeterminate. Normal left atrial pressure. Right Ventricle: The right ventricular size is normal. No increase in right ventricular wall thickness. Global RV systolic function is has normal systolic function. The tricuspid regurgitant velocity is 2.73 m/s, and with an assumed right atrial pressure  of 3 mmHg, the estimated right ventricular systolic pressure is mildly elevated at 32.8 mmHg. Left Atrium: Left atrial size was normal in size. Right Atrium: Right atrial size was normal in size Pericardium: There is no evidence of pericardial effusion. Mitral Valve: The mitral valve is normal in structure. Trivial mitral valve regurgitation. No evidence of mitral valve stenosis by observation. Tricuspid Valve: The tricuspid valve is normal in structure. Tricuspid valve regurgitation is mild. Aortic Valve: The aortic valve is tricuspid. . There is mild thickening and mild calcification of the aortic valve. Aortic valve regurgitation is not visualized. The aortic valve is structurally normal, with no evidence of sclerosis or stenosis. There is  mild thickening of the aortic valve. There is mild calcification of the aortic valve. Pulmonic Valve: The pulmonic valve was normal in structure. Pulmonic valve regurgitation is not visualized. Pulmonic regurgitation is not visualized. Aorta: The aortic root, ascending aorta and aortic arch are all structurally normal, with no evidence of dilitation or obstruction. Venous: The inferior vena cava is normal in size  with greater than 50% respiratory variability, suggesting right atrial pressure of 3 mmHg. IAS/Shunts: No atrial level shunt detected by color flow Doppler. There is no evidence of a patent foramen ovale. No ventricular septal defect is seen or detected. There is no evidence of an atrial septal defect. Additional Comments: A pacer wire is visualized.  LEFT VENTRICLE PLAX 2D LVIDd:         3.70 cm LVIDs:         3.00 cm LV PW:         0.80 cm LV IVS:        0.80 cm LVOT diam:     1.80 cm LV SV:  23 ml LV SV Index:   13.47 LVOT Area:     2.54 cm  LV Volumes (MOD) LV area d, A2C:    21.60 cm LV area d, A4C:    23.90 cm LV area s, A2C:    14.70 cm LV area s, A4C:    17.30 cm LV major d, A2C:   6.14 cm LV major d, A4C:   7.28 cm LV major s, A2C:   5.04 cm LV major s, A4C:   6.30 cm LV vol d, MOD A2C: 63.0 ml LV vol d, MOD A4C: 66.5 ml LV vol s, MOD A2C: 35.6 ml LV vol s, MOD A4C: 40.6 ml LV SV MOD A2C:     27.4 ml LV SV MOD A4C:     66.5 ml LV SV MOD BP:      27.8 ml RIGHT VENTRICLE TAPSE (M-mode): 2.0 cm LEFT ATRIUM             Index       RIGHT ATRIUM           Index LA diam:        4.60 cm 2.72 cm/m  RA Area:     12.30 cm LA Vol (A2C):   47.0 ml 27.76 ml/m RA Volume:   26.80 ml  15.83 ml/m LA Vol (A4C):   35.4 ml 20.91 ml/m LA Biplane Vol: 41.1 ml 24.28 ml/m  AORTIC VALVE LVOT Vmax:   97.50 cm/s LVOT Vmean:  63.600 cm/s LVOT VTI:    0.164 m  AORTA Ao Root diam: 3.30 cm TRICUSPID VALVE TR Peak grad:   29.8 mmHg TR Vmax:        273.00 cm/s  SHUNTS Systemic VTI:  0.16 m Systemic Diam: 1.80 cm  Skeet Latch MD Electronically signed by Skeet Latch MD Signature Date/Time: 02/08/2019/3:48:15 PM    Final    [place] EKG SITE RITE  Result Date: 02/09/2019 If Site Rite image not attached, placement could not be confirmed due to current cardiac rhythm.   Anti-infectives: Anti-infectives (From admission, onward)   Start     Dose/Rate Route Frequency Ordered Stop   02/02/19 0600  meropenem (MERREM)  1 g in sodium chloride 0.9 % 100 mL IVPB     1 g 200 mL/hr over 30 Minutes Intravenous Every 12 hours 02/01/19 2243 02/04/19 0604   02/01/19 1730  clindamycin (CLEOCIN) IVPB 900 mg     900 mg 100 mL/hr over 30 Minutes Intravenous On call to O.R. 02/01/19 1654 02/01/19 2009   02/01/19 1730  gentamicin (GARAMYCIN) 310 mg in dextrose 5 % 100 mL IVPB     5 mg/kg  61.2 kg 107.8 mL/hr over 60 Minutes Intravenous On call to O.R. 02/01/19 1654 02/01/19 1957   02/01/19 1545  meropenem (MERREM) 1 g in sodium chloride 0.9 % 100 mL IVPB     1 g 200 mL/hr over 30 Minutes Intravenous STAT 02/01/19 1533 02/01/19 1724      Assessment/Plan: /p Procedure(s): LAPAROSCOPY DIAGNOSTIC LYSIS OF ADHESION LAPAROSCOPIC SMALL BOWEL RESECTION WITH TAP BLOCK APPLICATION OF WOUND VAC Continue clears until nausea resolves  Add reglan prn for possible ileus 1. LAPAROSCOPY DIAGNOSTIC, LYSIS OF ADHESION, LAPAROSCOPIC SMALL BOWEL RESECTION, APPLICATION OF WOUND VAC - 02/01/2019 - Gross  Ileus on plain films   Insert NGT and start TNA   2. Sick sinus syndrome 3. Atrial fibrillation- on Xarelto/flecainide/Cardizem Discussed with Dr. Marlou Porch- can switch meds to po for A Fib 4. Hypertension 5. Hx right  IN nuclear lesion 6. DVT prophylaxis - on IV heparin 7. Anemia  Hgb - 9.1 - 02/06/2019 8. Mildly confused - seems to get worse at night.  Principal Problem: Closed loop SBO (small bowel obstruction)  Active Problems: A-fib (HCC) Hypertension Sick sinus syndrome (HCC) Sepsis (HCC) Chronic anticoagulation Cardiac pacemaker in place - [employer] [id number] Advisa DR MRI Rectocele DM (diabetes mellitus) ([place]) History of adenomatous polyp of colon Bowel obstruction ([place]) Acute focal ischemia of small intestine ([place])   LOS: 8 days    Marcello Moores A Keigo Whalley 02/09/2019

## 2019-02-09 NOTE — Progress Notes (Signed)
PT Cancellation Note  Patient Details Name: Megan Hester MRN: [id number] DOB: [birth year]   Cancelled Treatment:     pt looks and feels worse.  NG tube now in place and yield 3 lts.  Will attempt to see another day as schedule permits.     Rica Koyanagi  PTA Acute  Rehabilitation Services Pager      [phone] Office      [phone]

## 2019-02-09 NOTE — Progress Notes (Signed)
Initial Nutrition Assessment  DOCUMENTATION CODES:   Non-severe (moderate) malnutrition in context of chronic illness  INTERVENTION:  - TPN initiation and advancement per Pharmacist.  - diet advancement as medically feasible.   Monitor magnesium, potassium, and phosphorus daily for at least 3 days, MD to replete as needed, as pt is at risk for refeeding syndrome given moderate malnutrition, unknown PO intakes PTA.    NUTRITION DIAGNOSIS:   Moderate Malnutrition related to chronic illness as evidenced by mild fat depletion, mild muscle depletion.  GOAL:   Patient will meet greater than or equal to 90% of their needs  MONITOR:   Diet advancement, Labs, Weight trends, I & O's, Other (Comment)(TPN regimen)  REASON FOR ASSESSMENT:   Consult New TPN/TNA  ASSESSMENT:   85 y.o. female with A.fib, SSS with pacer on Xarelto, HTN, history DCIS, and enucleation of the R eye. She presented to the ED on 12/22 with 1 day of abdominal pain, N/V. In the ED she was hypotensive, found to have AKI, and CT abdomen/pelvis showed closed-loop obstruction with possible early ischemia. She underwent emergent surgical intervention with LOA, small bowel resection, and wound vac placement.  Significant Events: 12/22- admission; NGT placement; laparoscopy with LOA, small bowel resection, primary umbilical hernia repair, wound vac placement 12/26- NGT removed 12/27- diet advanced from NPO to CLD 12/29- NGT replacement; changed back to NPO   Patient has mainly been NPO since admission. No intakes documented while she was on CLD. Patient resting in bed with no family/visitors present. NGT in place to LIS with ~200 ml output. Flow sheet documentation indicates 1700 ml output during night shift. Notes state patient with N/V yesterday (12/29). She was found to have ileus.   Notes state that patient has mild confusion which worsens at night. Patient was able to be awoken to name call x1, but she was unable to  provide any nutrition-related history. Pillow placed behind her head, per her request. Patient requested water but before RD could even leave the room to page MD to ask about ice chips, she had fallen back to sleep. Did not page MD at this time.   Per chart review, weight has been stable throughout hospitalization. Weight on 9/21 was 148 lb and current weight is 144 lb indicating 4 lb weight loss (2.7% body weight) in the past 3 months; not significant for time frame.    Labs reviewed; BUN: 27 mg/dl, Ca: 8.2 mg/dl. Medications reviewed; 20 mg IV pepcid BID, sliding scale novolog. IVF; LR @ 50 ml/hr.    NUTRITION - FOCUSED PHYSICAL EXAM:    Most Recent Value  Orbital Region  No depletion  Upper Arm Region  Mild depletion  Thoracic and Lumbar Region  Mild depletion  Buccal Region  Mild depletion  Temple Region  Mild depletion  Clavicle Bone Region  Mild depletion  Clavicle and Acromion Bone Region  Mild depletion  Scapular Bone Region  Unable to assess  Dorsal Hand  Mild depletion  Patellar Region  Unable to assess  Anterior Thigh Region  Unable to assess  Posterior Calf Region  Unable to assess  Edema (RD Assessment)  Unable to assess  Hair  Reviewed  Eyes  Reviewed  Mouth  Reviewed  Skin  Reviewed  Nails  Reviewed       Diet Order:   Diet Order    None      EDUCATION NEEDS:   No education needs have been identified at this time  Skin:  Skin Assessment:  Skin Integrity Issues: Skin Integrity Issues:: Incisions Incisions: abdomen (12/22)  Last BM:  12/28  Height:   Ht Readings from Last 1 Encounters:  02/01/19 5\' 4"  (1.626 m)    Weight:   Wt Readings from Last 1 Encounters:  02/09/19 65.5 kg    Ideal Body Weight:  54.5 kg  BMI:  Body mass index is 24.79 kg/m.  Estimated Nutritional Needs:   Kcal:  1845-2030 kcal  Protein:  90-105 grams  Fluid:  >/= 1.8 L/day     Jarome Matin, MS, RD, LDN, [facility] Inpatient Clinical Dietitian Pager #  [phone] After hours/weekend pager # [phone]

## 2019-02-10 LAB — COMPREHENSIVE METABOLIC PANEL
ALT: 21 U/L (ref 0–44)
AST: 19 U/L (ref 15–41)
Albumin: 2.6 g/dL — ABNORMAL LOW (ref 3.5–5.0)
Alkaline Phosphatase: 71 U/L (ref 38–126)
Anion gap: 11 (ref 5–15)
BUN: 26 mg/dL — ABNORMAL HIGH (ref 8–23)
CO2: 27 mmol/L (ref 22–32)
Calcium: 8.1 mg/dL — ABNORMAL LOW (ref 8.9–10.3)
Chloride: 107 mmol/L (ref 98–111)
Creatinine, Ser: 0.57 mg/dL (ref 0.44–1.00)
GFR calc Af Amer: >60 mL/min (ref >60)
GFR calc non Af Amer: >60 mL/min (ref >60)
Glucose, Bld: 159 mg/dL — ABNORMAL HIGH (ref 70–99)
Potassium: 3.1 mmol/L — ABNORMAL LOW (ref 3.5–5.1)
Sodium: 145 mmol/L (ref 135–145)
Total Bilirubin: 1.4 mg/dL — ABNORMAL HIGH (ref 0.3–1.2)
Total Protein: 4.8 g/dL — ABNORMAL LOW (ref 6.5–8.1)

## 2019-02-10 LAB — CBC
HCT: 26.6 % — ABNORMAL LOW (ref 36.0–46.0)
Hemoglobin: 8.3 g/dL — ABNORMAL LOW (ref 12.0–15.0)
MCH: 35.8 pg — ABNORMAL HIGH (ref 26.0–34.0)
MCHC: 31.2 g/dL (ref 30.0–36.0)
MCV: 114.7 fL — ABNORMAL HIGH (ref 80.0–100.0)
Platelets: 227 10*3/uL (ref 150–400)
RBC: 2.32 MIL/uL — ABNORMAL LOW (ref 3.87–5.11)
RDW: 15 % (ref 11.5–15.5)
WBC: 8.6 10*3/uL (ref 4.0–10.5)
nRBC: 0.4 % — ABNORMAL HIGH (ref 0.0–0.2)

## 2019-02-10 LAB — GLUCOSE, CAPILLARY
Glucose-Capillary: 130 mg/dL — ABNORMAL HIGH (ref 70–99)
Glucose-Capillary: 130 mg/dL — ABNORMAL HIGH (ref 70–99)
Glucose-Capillary: 136 mg/dL — ABNORMAL HIGH (ref 70–99)
Glucose-Capillary: 138 mg/dL — ABNORMAL HIGH (ref 70–99)
Glucose-Capillary: 161 mg/dL — ABNORMAL HIGH (ref 70–99)
Glucose-Capillary: 227 mg/dL — ABNORMAL HIGH (ref 70–99)
Glucose-Capillary: 55 mg/dL — ABNORMAL LOW (ref 70–99)

## 2019-02-10 LAB — DIFFERENTIAL
Abs Immature Granulocytes: 0.04 10*3/uL (ref 0.00–0.07)
Basophils Absolute: 0 10*3/uL (ref 0.0–0.1)
Basophils Relative: 0 %
Eosinophils Absolute: 0.2 10*3/uL (ref 0.0–0.5)
Eosinophils Relative: 2 %
Immature Granulocytes: 1 %
Lymphocytes Relative: 13 %
Lymphs Abs: 1.1 10*3/uL (ref 0.7–4.0)
Monocytes Absolute: 0.6 10*3/uL (ref 0.1–1.0)
Monocytes Relative: 7 %
Neutro Abs: 6.6 10*3/uL (ref 1.7–7.7)
Neutrophils Relative %: 77 %

## 2019-02-10 LAB — PREALBUMIN: Prealbumin: 8.8 mg/dL — ABNORMAL LOW (ref 18–38)

## 2019-02-10 LAB — PHOSPHORUS: Phosphorus: 2 mg/dL — ABNORMAL LOW (ref 2.5–4.6)

## 2019-02-10 LAB — MAGNESIUM: Magnesium: 2.1 mg/dL (ref 1.7–2.4)

## 2019-02-10 LAB — TRIGLYCERIDES: Triglycerides: 99 mg/dL (ref <150)

## 2019-02-10 MED ORDER — DEXTROSE 50 % IV SOLN
INTRAVENOUS | Status: AC
  Administered 2019-02-10: 25 g via INTRAVENOUS
  Filled 2019-02-10: qty 50

## 2019-02-10 MED ORDER — AMIODARONE HCL IN DEXTROSE 360-4.14 MG/200ML-% IV SOLN
30.0000 mg/h | INTRAVENOUS | Status: DC
  Administered 2019-02-10 – 2019-02-12 (×6): 30 mg/h via INTRAVENOUS
  Filled 2019-02-10 (×7): qty 200

## 2019-02-10 MED ORDER — DILTIAZEM HCL 25 MG/5ML IV SOLN
10.0000 mg | Freq: Once | INTRAVENOUS | Status: AC
  Administered 2019-02-10: 10 mg via INTRAVENOUS
  Filled 2019-02-10: qty 5

## 2019-02-10 MED ORDER — AMIODARONE HCL IN DEXTROSE 360-4.14 MG/200ML-% IV SOLN
60.0000 mg/h | INTRAVENOUS | Status: AC
  Administered 2019-02-10: 60 mg/h via INTRAVENOUS
  Filled 2019-02-10: qty 200

## 2019-02-10 MED ORDER — LACTATED RINGERS IV SOLN: INTRAVENOUS | Status: AC

## 2019-02-10 MED ORDER — AMIODARONE LOAD VIA INFUSION
150.0000 mg | Freq: Once | INTRAVENOUS | Status: AC
  Administered 2019-02-10: 150 mg via INTRAVENOUS
  Filled 2019-02-10: qty 83.34

## 2019-02-10 MED ORDER — DEXTROSE 50 % IV SOLN: 25.0000 g | Freq: Once | INTRAVENOUS | Status: AC

## 2019-02-10 MED ORDER — POTASSIUM PHOSPHATES 15 MMOLE/5ML IV SOLN
15.0000 mmol | Freq: Once | INTRAVENOUS | Status: AC
  Administered 2019-02-10: 15 mmol via INTRAVENOUS
  Filled 2019-02-10: qty 5

## 2019-02-10 MED ORDER — TRAVASOL 10 % IV SOLN
INTRAVENOUS | Status: AC
  Filled 2019-02-10: qty 686.4

## 2019-02-10 MED ORDER — POTASSIUM CHLORIDE 10 MEQ/100ML IV SOLN: 10.0000 meq | INTRAVENOUS | Status: DC

## 2019-02-10 MED ORDER — POTASSIUM CHLORIDE 20 MEQ PO PACK
40.0000 meq | PACK | Freq: Two times a day (BID) | ORAL | Status: DC
  Administered 2019-02-10: 40 meq
  Filled 2019-02-10: qty 2

## 2019-02-10 MED ORDER — POTASSIUM CHLORIDE 10 MEQ/100ML IV SOLN
10.0000 meq | INTRAVENOUS | Status: AC
  Administered 2019-02-10 (×2): 10 meq via INTRAVENOUS
  Filled 2019-02-10: qty 100

## 2019-02-10 NOTE — Progress Notes (Signed)
PHARMACY - TOTAL PARENTERAL NUTRITION CONSULT NOTE   Indication: Post-operative ileus  Patient Measurements: Height: 5\' 4"  (162.6 cm) Weight: (bed giving innacurate weight) IBW/kg (Calculated) : 54.7 TPN AdjBW (KG): 61.2 Body mass index is 24.79 kg/m.  Recent Labs    02/09/19 0500 02/10/19 0805  NA 141 145  K 3.6 3.1*  CL 106 107  CO2 22 27  GLUCOSE 98 159*  BUN 27* 26*  CREATININE 0.57 0.57  CALCIUM 8.2* 8.1*  PHOS  --  2.0*  MG 2.0 2.1  ALBUMIN 2.6* 2.6*  ALKPHOS 71 71  AST 22 19  ALT 20 21  BILITOT 1.7* 1.4*  TRIG  --  99  PREALBUMIN  --  8.8*  Corr Ca 12/31: 9.2   Intake/Output Summary (Last 24 hours) at 02/10/2019 0941 Last data filed at 02/10/2019 0800 Gross per 24 hour  Intake 3841.04 ml  Output 1550 ml  Net 2291.04 ml    Assessment:   Glucose / Insulin: CBGs 104-138 since TPN started yesterday PM. VBG 159.  2 units SSI used in past 12 hrs Electrolytes: K low, Phos low; Na at ULN Renal: SCr WNL, BUN elevated LFTs / TGs: T.Bili elevated but improving; other LFTs below ULN; TG WNL Prealbumin / albumin: Prealbumin 8.8 today; albumin low Intake / Output; MIVF: I/O (+) 2.3 L; mIVF LR at 10 mL/hr GI Imaging: Abd XR 12/29: "persistent SBO gas pattern" Surgeries / Procedures:  12/22: LAPAROSCOPY DIAGNOSTIC WITH LYSIS OF ADHESION SMALL BOWEL RESECTION TAP BLOCK PRIMARY UMBILICAL HERNIA REPAIR APPLICATION OF WOUND VAC  Central access: PICC line placement ordered by MD for 12/30 TPN start date:  12/30 after PICC placed  Nutritional Goals per RD recommendation 12/30: 90-105 grams protein/d 1845-2030 KCal/d  Goal TPN rate (using formula of amino acids 52 g/L, dextrose 14%, and lipids 30 g/L)  is 80 mL/hr (provides 100 g of protein and 1889 kcals per day)  Current Nutrition:  TPN at 40 mL/hr  Plan:  KCl 10 mEq IV q1h x 2 KPhos 15 mM IV x 1 At 1800: -Increase TPN to 55 mL/hr -Reduce Na to 20 mEq/L in TPN -Increase K to 55 mEq/L in TPN Other  electrolytes in TPN: 99mEq/L of Ca, 36mEq/L of Mg, and 18mmol/L of Phos, Cl:Ac ratio 1:1 Add standard MVI to TPN MWF and trace elements to TPN daily Continue SSI Sensitive q4h   CMet, Mg, Phos tomorrow Monitor TPN labs on Mon/Thurs  Clayburn Pert, PharmD, BCPS 02/10/2019  9:41 AM

## 2019-02-10 NOTE — Progress Notes (Addendum)
Progress Note  Patient Name: Megan Hester Date of Encounter: 02/10/2019  Primary Cardiologist: Donato Heinz, MD  Subjective   More awake today. HRs more elevated than yesterday. Episodes of RVR overnight. Rates this AM 120's, asymptomatic. Denies chest pain, SOB. No abdominal pain   Inpatient Medications    Scheduled Meds: . bisacodyl  10 mg Rectal Daily  . Chlorhexidine Gluconate Cloth  6 each Topical Daily  . insulin aspart  0-9 Units Subcutaneous Q4H  . lip balm  1 application Topical BID  . mouth rinse  15 mL Mouth Rinse BID  . neomycin-polymyxin b-dexamethasone  1 application Right Eye QHS  . polyvinyl alcohol   Right Eye BID  . rivaroxaban  20 mg Per Tube Q supper  . sodium chloride flush  10-40 mL Intracatheter Q12H   Continuous Infusions: . famotidine (PEPCID) IV Stopped (02/09/19 2205)  . lactated ringers 10 mL/hr at 02/10/19 0300  . methocarbamol (ROBAXIN) IV    . ondansetron (ZOFRAN) IV    . TPN ADULT (ION) 40 mL/hr at 02/09/19 1815   PRN Meds: alum & mag hydroxide-simeth, clobetasol cream, diltiazem, diphenhydrAMINE, enalaprilat, haloperidol lactate, magic mouthwash, methocarbamol (ROBAXIN) IV, metoCLOPramide (REGLAN) injection, metoprolol tartrate, morphine injection, naphazoline-glycerin, OLANZapine, ondansetron (ZOFRAN) IV **OR** ondansetron (ZOFRAN) IV, phenol, prochlorperazine, simethicone, sodium chloride flush, sodium chloride flush   Vital Signs    Vitals:   02/10/19 0000 02/10/19 0347 02/10/19 0400 02/10/19 0500  BP: 117/70  (!) 97/44 102/83  Pulse: (!) 111  (!) 146 (!) 109  Resp: (!) 21  (!) 21 19  Temp:  98.2 F (36.8 C)    TempSrc:  Oral    SpO2: 96%  96% 96%  Weight:      Height:        Intake/Output Summary (Last 24 hours) at 02/10/2019 0733 Last data filed at 02/10/2019 0600 Gross per 24 hour  Intake 3732.11 ml  Output 1550 ml  Net 2182.11 ml   Filed Weights   02/08/19 0500 02/08/19 0913 02/09/19 0500   Weight: 85.7 kg 64.5 kg 65.5 kg    Physical Exam   GEN: Well nourished, well developed, in no acute distress.  HEENT: Grossly normal.  Neck: Supple, no JVD Cardiac: Irregularly irregular, no murmurs. Radials/DP 2+ and equal bilaterally.  Respiratory:  Respirations regular and unlabored, clear to auscultation bilaterally. Neuro:  Strength and sensation are intact. Psych: AAOx3.  Normal affect.  Labs    Chemistry Recent Labs  Lab 02/07/19 0404 02/08/19 0342 02/09/19 0500  NA 140 141 141  K 4.0 3.8 3.6  CL 109 107 106  CO2 22 23 22   GLUCOSE 103* 101* 98  BUN 19 22 27*  CREATININE 0.54 0.63 0.57  CALCIUM 8.1* 8.4* 8.2*  PROT 4.8* 5.0* 4.9*  ALBUMIN 2.5* 2.6* 2.6*  AST 24 27 22   ALT 19 24 20   ALKPHOS 87 81 71  BILITOT 2.0* 1.7* 1.7*  GFRNONAA >60 >60 >60  GFRAA >60 >60 >60  ANIONGAP 9 11 13      Hematology Recent Labs  Lab 02/07/19 0404 02/08/19 0342 02/09/19 0500  WBC 10.1 7.7 7.1  RBC 2.52* 2.52* 2.25*  HGB 9.3* 8.9* 8.1*  HCT 28.1* 28.4* 25.5*  MCV 111.5* 112.7* 113.3*  MCH 36.9* 35.3* 36.0*  MCHC 33.1 31.3 31.8  RDW 15.0 15.1 15.2  PLT 209 224 PLATELET CLUMPS NOTED ON SMEAR, UNABLE TO ESTIMATE    Cardiac EnzymesNo results for input(s): TROPONINI in the last 168  hours. No results for input(s): TROPIPOC in the last 168 hours.   BNPNo results for input(s): BNP, PROBNP in the last 168 hours.   DDimer No results for input(s): DDIMER in the last 168 hours.   Radiology    DG Abd 1 View  Result Date: 02/08/2019 CLINICAL DATA:  Abdominal pain, nausea and vomiting. EXAM: ABDOMEN - 1 VIEW COMPARISON:  CT scan 01/22/2019 FINDINGS: Moderate gaseous distention of the stomach and small bowel is demonstrated consistent with a small-bowel obstruction. Some scattered air is noted in the right colon. IMPRESSION: Persistent small-bowel obstruction bowel gas pattern. No definite free air. Electronically Signed   By: Marijo Sanes M.D.   On: 02/08/2019 16:25   DG CHEST  PORT 1 VIEW  Result Date: 02/09/2019 CLINICAL DATA:  Status post PICC line insertion. EXAM: PORTABLE CHEST 1 VIEW COMPARISON:  02/01/2019 FINDINGS: There is a left chest wall pacer device with lead in the right atrial appendage and right ventricle. An NG tube is identified with tip below the field of view. Right arm PICC line has been placed the tip terminates over the distal SVC. Heart size appears normal. Small right pleural effusion identified. No airspace opacities. Chronic deformity involving the proximal right humerus noted. Advanced degenerative changes are noted at both glenohumeral joints. IMPRESSION: 1. Satisfactory position of right arm PICC line. 2. Small right pleural effusion. Electronically Signed   By: Kerby Moors M.D.   On: 02/09/2019 11:41   ECHOCARDIOGRAM COMPLETE  Result Date: 02/08/2019   ECHOCARDIOGRAM REPORT   Patient Name:   Megan Hester [facility] Date of Exam: 02/08/2019 Medical Rec #:  [id number]           Height:       64.0 in Accession #:    [id number]          Weight:       142.3 lb Date of Birth:  1930/11/30          BSA:          1.69 m Patient Age:    50 years            BP:           151/84 mmHg Patient Gender: F                   HR:           115 bpm. Exam Location:  Inpatient Procedure: 2D Echo, Cardiac Doppler and Color Doppler Indications:    Atrial Fibrillation 427.31  History:        Patient has prior history of Echocardiogram examinations, most                 recent 01/08/2015. Pacemaker, Arrythmias:Atrial Fibrillation,                 Signs/Symptoms:Shortness of Breath; Risk Factors:Hypertension,                 Diabetes and Non-Smoker. Sepsis.  Sonographer:    Paulita Fujita RDCS Referring Phys: [place]  1. Left ventricular ejection fraction, by visual estimation, is 50 to 55%. The left ventricle has low normal function. Left ventricular septal wall thickness was normal. Normal left ventricular posterior wall thickness. There is no left  ventricular hypertrophy.  2. Left ventricular diastolic parameters are indeterminate.  3. The left ventricle has no regional wall motion abnormalities.  4. Global right ventricle has normal systolic function.The right ventricular size is normal.  No increase in right ventricular wall thickness.  5. Left atrial size was normal.  6. Right atrial size was normal.  7. The mitral valve is normal in structure. Trivial mitral valve regurgitation. No evidence of mitral stenosis.  8. The tricuspid valve is normal in structure.  9. The aortic valve is tricuspid. Aortic valve regurgitation is not visualized. No evidence of aortic valve sclerosis or stenosis. 10. The pulmonic valve was normal in structure. Pulmonic valve regurgitation is not visualized. 11. Mildly elevated pulmonary artery systolic pressure. 12. A pacer wire is visualized. 13. The inferior vena cava is normal in size with greater than 50% respiratory variability, suggesting right atrial pressure of 3 mmHg. FINDINGS  Left Ventricle: Left ventricular ejection fraction, by visual estimation, is 50 to 55%. The left ventricle has low normal function. The left ventricle has no regional wall motion abnormalities. The left ventricular internal cavity size was the left ventricle is normal in size. Normal left ventricular posterior wall thickness. There is no left ventricular hypertrophy. Left ventricular diastolic parameters are indeterminate. Normal left atrial pressure. Right Ventricle: The right ventricular size is normal. No increase in right ventricular wall thickness. Global RV systolic function is has normal systolic function. The tricuspid regurgitant velocity is 2.73 m/s, and with an assumed right atrial pressure  of 3 mmHg, the estimated right ventricular systolic pressure is mildly elevated at 32.8 mmHg. Left Atrium: Left atrial size was normal in size. Right Atrium: Right atrial size was normal in size Pericardium: There is no evidence of pericardial  effusion. Mitral Valve: The mitral valve is normal in structure. Trivial mitral valve regurgitation. No evidence of mitral valve stenosis by observation. Tricuspid Valve: The tricuspid valve is normal in structure. Tricuspid valve regurgitation is mild. Aortic Valve: The aortic valve is tricuspid. . There is mild thickening and mild calcification of the aortic valve. Aortic valve regurgitation is not visualized. The aortic valve is structurally normal, with no evidence of sclerosis or stenosis. There is  mild thickening of the aortic valve. There is mild calcification of the aortic valve. Pulmonic Valve: The pulmonic valve was normal in structure. Pulmonic valve regurgitation is not visualized. Pulmonic regurgitation is not visualized. Aorta: The aortic root, ascending aorta and aortic arch are all structurally normal, with no evidence of dilitation or obstruction. Venous: The inferior vena cava is normal in size with greater than 50% respiratory variability, suggesting right atrial pressure of 3 mmHg. IAS/Shunts: No atrial level shunt detected by color flow Doppler. There is no evidence of a patent foramen ovale. No ventricular septal defect is seen or detected. There is no evidence of an atrial septal defect. Additional Comments: A pacer wire is visualized.  LEFT VENTRICLE PLAX 2D LVIDd:         3.70 cm LVIDs:         3.00 cm LV PW:         0.80 cm LV IVS:        0.80 cm LVOT diam:     1.80 cm LV SV:         23 ml LV SV Index:   13.47 LVOT Area:     2.54 cm  LV Volumes (MOD) LV area d, A2C:    21.60 cm LV area d, A4C:    23.90 cm LV area s, A2C:    14.70 cm LV area s, A4C:    17.30 cm LV major d, A2C:   6.14 cm LV major d, A4C:   7.28  cm LV major s, A2C:   5.04 cm LV major s, A4C:   6.30 cm LV vol d, MOD A2C: 63.0 ml LV vol d, MOD A4C: 66.5 ml LV vol s, MOD A2C: 35.6 ml LV vol s, MOD A4C: 40.6 ml LV SV MOD A2C:     27.4 ml LV SV MOD A4C:     66.5 ml LV SV MOD BP:      27.8 ml RIGHT VENTRICLE TAPSE (M-mode):  2.0 cm LEFT ATRIUM             Index       RIGHT ATRIUM           Index LA diam:        4.60 cm 2.72 cm/m  RA Area:     12.30 cm LA Vol (A2C):   47.0 ml 27.76 ml/m RA Volume:   26.80 ml  15.83 ml/m LA Vol (A4C):   35.4 ml 20.91 ml/m LA Biplane Vol: 41.1 ml 24.28 ml/m  AORTIC VALVE LVOT Vmax:   97.50 cm/s LVOT Vmean:  63.600 cm/s LVOT VTI:    0.164 m  AORTA Ao Root diam: 3.30 cm TRICUSPID VALVE TR Peak grad:   29.8 mmHg TR Vmax:        273.00 cm/s  SHUNTS Systemic VTI:  0.16 m Systemic Diam: 1.80 cm  Skeet Latch MD Electronically signed by Skeet Latch MD Signature Date/Time: 02/08/2019/3:48:15 PM    Final    [place] EKG SITE RITE  Result Date: 02/09/2019 If Site Rite image not attached, placement could not be confirmed due to current cardiac rhythm.  Telemetry    02/10/2019 AF with HRs in the 120 range - Personally Reviewed  ECG    No new tracing as of 02/10/2019 - Personally Reviewed  Cardiac Studies   Echocardiogram 02/08/2019:  1. Left ventricular ejection fraction, by visual estimation, is 50 to 55%. The left ventricle has low normal function. Left ventricular septal wall thickness was normal. Normal left ventricular posterior wall thickness. There is no left ventricular  hypertrophy. 2. Left ventricular diastolic parameters are indeterminate. 3. The left ventricle has no regional wall motion abnormalities. 4. Global right ventricle has normal systolic function.The right ventricular size is normal. No increase in right ventricular wall thickness. 5. Left atrial size was normal. 6. Right atrial size was normal. 7. The mitral valve is normal in structure. Trivial mitral valve regurgitation. No evidence of mitral stenosis. 8. The tricuspid valve is normal in structure. 9. The aortic valve is tricuspid. Aortic valve regurgitation is not visualized. No evidence of aortic valve sclerosis or stenosis. 10. The pulmonic valve was normal in structure. Pulmonic valve  regurgitation is not visualized. 11. Mildly elevated pulmonary artery systolic pressure. 12. A pacer wire is visualized. 13. The inferior vena cava is normal in size with greater than 50% respiratory variability, suggesting right atrial pressure of 3 mmHg  Patient Profile     85 y.o. female with a hx of sick sinus syndromes/p PPM, atrial fibrillation on Xarelto, hypertension, GERD who presents with small bowel obstructions/p small bowel resection.She is postop day 2 from her procedure. We are consulted by Dr. Jerry Caras the evaluation of atrial fibrillation with RVR.  Assessment & Plan    1.  Paroxysmal atrial fibrillation: -Previously on flecainide with recommendations for holding for 2 weeks therefore this was discontinued>>Heart rates noted to be suboptimal 02/08/2019 and she was given diltiazem 10 mg IV. Plan was to start p.o. diltiazem 30 mg every  6 hours however it appears that this was not started in the setting of hypotension and poor gut function -HR more elevated today>>>plan for Amio gtt secondary to above>>likley not long term. -Unable to further titrate BB given soft BPs  -Anticoagulated with Xarelto 20 mg daily  2.  Sick sinus syndrome: -PPM in place   3.  Essential hypertension: -Soft BPs today  4.  Small bowel resection with lysis of adhesions: -Postop day 8, NG tube in place>>450 output today  -Per primary/surgical teams   Signed, Kathyrn Drown NP-C [facility] Pager: [phone] 02/10/2019, 7:33 AM     For questions or updates, please contact   Please consult [email or web address] for contact info under Cardiology/STEMI.   Patient seen and examined with Kathyrn Drown, NP-C.  Agree as above, with the following exceptions and changes as noted below.  Patient remains tachycardic today in atrial fibrillation, initially was responsive to IV diltiazem 2 days ago, however had hypotension with oral diltiazem and did not respond today to IV diltiazem.  We have initiated an  amiodarone infusion. Gen: NAD, CV: Irregular and tachycardic, no murmurs, Lungs: clear, Abd: soft, Extrem: Warm, well perfused, no edema, Neuro/Psych: alert and oriented x 3, normal mood and affect. All available labs, radiology testing, previous records reviewed.   Though the plan initially was to washout amiodarone and restart flecainide, the patient has had poorly controlled rates during her hospitalization.  She did not experience hypotension until yesterday, and with hypotension again today and with rates unresponsive to IV diltiazem, we have chosen to restart the amiodarone infusion while she recovers from her GI process.  I have asked her nurse Megan Hester to rebolus her amiodarone so long as she is not hypotensive 1 time and continue the amiodarone infusion overnight.  I will plan to follow-up with the patient in the morning.  Hopefully as she recovers from her small bowel resection with lysis of adhesions she will have less inciting factors for atrial fibrillation and we can work towards an oral antiarrhythmic goal.  The patient initially had said that she did not want to have an ablation but that she would not of to spend time away from her husband, however her husband is being cared for by family members, and if further antiarrhythmic therapy or ablation were on the table she would consider it.  Megan Hester 02/10/19 6:51 PM

## 2019-02-10 NOTE — Progress Notes (Signed)
PROGRESS NOTE    Megan Hester  [id number] DOB: 13-Jan-1931 DOA: 02/01/2019 PCP: [name], MD    Brief Narrative:  85 y.o. F with A. fib, SSS with pacer, on Xarelto, HTN, history DCIS, and enucleation of the right eye who presented with 1 day abdominal pain nausea and vomiting.  In the ER, she was hypotensive, had AKI, and CT of the abdomen and pelvis showed closed-loop obstruction with possible early ischemia.  She underwent urgent surgical intervention with LOA, small bowel resection, and application of wound VAC.  Interim course: Postoperative day she has had some delirium. She is also developed A. fib with RVR, for which cardiology was consulted she was started on amiodarone drip, but this was stopped due to elevated LFTs.   Assessment & Plan:   Principal Problem:   Closed loop SBO (small bowel obstruction)  Active Problems:   A-fib (HCC)   Hypertension   Sick sinus syndrome (HCC)   Sepsis (HCC)   Chronic anticoagulation   Cardiac pacemaker in place - Medtronic [id number] Advisa DR MRI   Rectocele   DM (diabetes mellitus) ([place])   History of adenomatous polyp of colon   Bowel obstruction (HCC)   Acute focal ischemia of small intestine (HCC)  Small bowel obstruction S/p laparoscopic LOA, small bowel resection 12/22 by Dr. Johney Maine Postop day 9,  -Recent concerns of post-op ileus with NG placed. Now improving with Surgery planning on clamping NG and possible remove -Wound VAC per general surgery -Started TNA per General Surgery -Recheck  Chronic atrial fibrillation with RVR Sick sinus syndrome with pacer Had been on flecainide, cannot restart for 2 weeks.  Has been on amiodarone, but LFTs were rising, so we would like to avoid this.  HR has not responded well to beta blockade.   -Continue Xarelto. Per Surgery, pt can take PO cardiac meds -Cardiology following. Issues with hypotension when attempting to control rate --Now with amiodarone gtt and PRN IV  cardizem -HR currently remains in the 150's, pt asymptomatic. Will follow up on Cardiology recommendations  Delirium -Post-op delirium expected in 85 yo. -continue with delirium precautions:              -Lights and TV off, minimize interruptions at night             -Blinds open and lights on during day             -Glasses/hearing aid with patient             -Frequent reorientation             -PT/OT when able             -Cont to avoid sedation medications/[place] list medications   Hypertension -BP remains soft -Continue on metoprolol PRN -see above, plan for PRN IV cardizem for rate control if needed. This afternoon, given 5mg  IV cardizem with little effect. Will follow up on Cardiology recs  AKI -Cr 1.9 at admission -Now resolved, recheck bmet in AM  Sepsis ruled out -Patient presented with SIRS physiology and elevated lactate. There was initial concern for sepsis, She was treated with 3 days of meropenem, but this resolved after small bowel resection, and she has had no further signs of infection, and likely her SIRS was from bowel ischemia. -remains stable at this time  Acute blood loss anemia Baseline Hgb normal, post-op has been down to ~9 g/dL, stable.  No clinical bleeding.  Macrocytic, B12, TSH and folate normal. -repeat  CBC in AM   Other medications -Continue eye drops -Anticipate resuming sertraline at time of discharge   DVT prophylaxis: Xarelto Code Status: Full Family Communication: Pt in room, family not at bedside Disposition Plan: Uncertain at this time  Consultants:   Cardiology  General Surgery  Procedures:   12/22 Lap LOA and small bowel resection  Antimicrobials: Anti-infectives (From admission, onward)   Start     Dose/Rate Route Frequency Ordered Stop   02/02/19 0600  meropenem (MERREM) 1 g in sodium chloride 0.9 % 100 mL IVPB     1 g 200 mL/hr over 30 Minutes Intravenous Every 12 hours 02/01/19 2243 02/04/19 0604   02/01/19 1730   clindamycin (CLEOCIN) IVPB 900 mg     900 mg 100 mL/hr over 30 Minutes Intravenous On call to O.R. 02/01/19 1654 02/01/19 2009   02/01/19 1730  gentamicin (GARAMYCIN) 310 mg in dextrose 5 % 100 mL IVPB     5 mg/kg  61.2 kg 107.8 mL/hr over 60 Minutes Intravenous On call to O.R. 02/01/19 1654 02/01/19 1957   02/01/19 1545  meropenem (MERREM) 1 g in sodium chloride 0.9 % 100 mL IVPB     1 g 200 mL/hr over 30 Minutes Intravenous STAT 02/01/19 1533 02/01/19 1724      Subjective: Reports feeling better this AM. In good spirits  Objective: Vitals:   02/10/19 1200 02/10/19 1317 02/10/19 1400 02/10/19 1605  BP:  (!) 148/125 (!) 111/98 92/74  Pulse:  (!) 144 (!) 118 (!) 124  Resp:  18 (!) 24 (!) 31  Temp: 98.2 F (36.8 C)     TempSrc: Oral     SpO2:  97% 97% 99%  Weight:      Height:        Intake/Output Summary (Last 24 hours) at 02/10/2019 1613 Last data filed at 02/10/2019 1504 Gross per 24 hour  Intake 1850.03 ml  Output 1550 ml  Net 300.03 ml   Filed Weights   02/08/19 0500 02/08/19 0913 02/09/19 0500  Weight: 85.7 kg 64.5 kg 65.5 kg    Examination: General exam: Awake, laying in bed, in nad Respiratory system: Normal respiratory effort, no wheezing Cardiovascular system: tachycardic, s1, s2 Gastrointestinal system: Soft, nondistended, positive BS Central nervous system: CN2-12 grossly intact, strength intact Extremities: Perfused, no clubbing Skin: Normal skin turgor, no notable skin lesions seen Psychiatry: Mood normal // no visual hallucinations   Data Reviewed: I have personally reviewed following labs and imaging studies  CBC: Recent Labs  Lab 02/04/19 0650 02/06/19 0214 02/07/19 0404 02/08/19 0342 02/09/19 0500 02/10/19 0805  WBC 6.6  6.4 6.5 10.1 7.7 7.1 8.6  NEUTROABS 5.0  4.9 4.4  --   --   --  6.6  HGB 9.4*  9.2* 9.1* 9.3* 8.9* 8.1* 8.3*  HCT 28.3*  28.1* 28.2* 28.1* 28.4* 25.5* 26.6*  MCV 109.7*  112.0* 110.2* 111.5* 112.7* 113.3* 114.7*   PLT 99*  104* 181 209 224 PLATELET CLUMPS NOTED ON SMEAR, UNABLE TO ESTIMATE [id number]   Basic Metabolic Panel: Recent Labs  Lab 02/05/19 0841 02/06/19 0214 02/07/19 0404 02/08/19 0342 02/09/19 0500 02/10/19 0805  NA 144 139 140 141 141 145  K 3.4* 3.0* 4.0 3.8 3.6 3.1*  CL 109 106 109 107 106 107  CO2 25 24 22 23 22 27   GLUCOSE 87 169* 103* 101* 98 159*  BUN 28* 19 19 22  27* 26*  CREATININE 0.62 0.63 0.54 0.63 0.57 0.57  CALCIUM 8.7* 8.2* 8.1*  8.4* 8.2* 8.1*  MG 2.0 1.8 1.9 2.0 2.0 2.1  PHOS 2.3* 3.0  --   --   --  2.0*   GFR: Estimated Creatinine Clearance: 43.6 mL/min (by C-G formula based on SCr of 0.57 mg/dL). Liver Function Tests: Recent Labs  Lab 02/06/19 0214 02/07/19 0404 02/08/19 0342 02/09/19 0500 02/10/19 0805  AST 23 24 27 22 19   ALT 17 19 24 20 21   ALKPHOS 90 87 81 71 71  BILITOT 1.0 2.0* 1.7* 1.7* 1.4*  PROT 5.0* 4.8* 5.0* 4.9* 4.8*  ALBUMIN 2.5* 2.5* 2.6* 2.6* 2.6*   No results for input(s): LIPASE, AMYLASE in the last 168 hours. No results for input(s): AMMONIA in the last 168 hours. Coagulation Profile: No results for input(s): INR, PROTIME in the last 168 hours. Cardiac Enzymes: No results for input(s): CKTOTAL, CKMB, CKMBINDEX, TROPONINI in the last 168 hours. BNP (last 3 results) No results for input(s): PROBNP in the last 8760 hours. HbA1C: No results for input(s): HGBA1C in the last 72 hours. CBG: Recent Labs  Lab 02/09/19 2318 02/10/19 0304 02/10/19 0754 02/10/19 1143 02/10/19 1521  GLUCAP 110* 138* 136* 161* 130*   Lipid Profile: Recent Labs    02/10/19 0805  TRIG 99   Thyroid Function Tests: No results for input(s): TSH, T4TOTAL, FREET4, T3FREE, THYROIDAB in the last 72 hours. Anemia Panel: No results for input(s): VITAMINB12, FOLATE, FERRITIN, TIBC, IRON, RETICCTPCT in the last 72 hours. Sepsis Labs: No results for input(s): PROCALCITON, LATICACIDVEN in the last 168 hours.  Recent Results (from the past 240 hour(s))   Culture, blood (routine x 2)     Status: None   Collection Time: 02/01/19  4:35 PM   Specimen: BLOOD  Result Value Ref Range Status   Specimen Description   Final    BLOOD RIGHT WRIST Performed at [facility] [address] [place], [place] [address]    Special Requests   Final    BOTTLES DRAWN AEROBIC AND ANAEROBIC Blood Culture adequate volume Performed at [facility] [address] [place], [place] [address]    Culture   Final    NO GROWTH 5 DAYS Performed at [facility], [place] [address], [place], [place] [address]    Report Status 02/06/2019 FINAL  Final  Respiratory Panel by RT PCR (Flu A&B, Covid) - Nasopharyngeal Swab     Status: None   Collection Time: 02/01/19  4:38 PM   Specimen: Nasopharyngeal Swab  Result Value Ref Range Status   SARS Coronavirus 2 by RT PCR NEGATIVE NEGATIVE Final    Comment: (NOTE) SARS-CoV-2 target nucleic acids are NOT DETECTED. The SARS-CoV-2 RNA is generally detectable in upper respiratoy specimens during the acute phase of infection. The lowest concentration of SARS-CoV-2 viral copies this assay can detect is 131 copies/mL. A negative result does not preclude SARS-Cov-2 infection and should not be used as the sole basis for treatment or other patient management decisions. A negative result may occur with  improper specimen collection/handling, submission of specimen other than nasopharyngeal swab, presence of viral mutation(s) within the areas targeted by this assay, and inadequate number of viral copies (<131 copies/mL). A negative result must be combined with clinical observations, patient history, and epidemiological information. The expected result is Negative. Fact Sheet for Patients:  PinkCheek.be Fact Sheet for Healthcare Providers:  GravelBags.it This test is not yet ap proved or cleared by the [place] FDA and  has been  authorized for detection  and/or diagnosis of SARS-CoV-2 by FDA under an Emergency Use Authorization (EUA). This EUA will remain  in effect (meaning this test can be used) for the duration of the COVID-19 declaration under Section 564(b)(1) of the Act, 21 U.S.C. section 360bbb-3(b)(1), unless the authorization is terminated or revoked sooner.    Influenza A by PCR NEGATIVE NEGATIVE Final   Influenza B by PCR NEGATIVE NEGATIVE Final    Comment: (NOTE) The Xpert Xpress SARS-CoV-2/FLU/RSV assay is intended as an aid in  the diagnosis of influenza from Nasopharyngeal swab specimens and  should not be used as a sole basis for treatment. Nasal washings and  aspirates are unacceptable for Xpert Xpress SARS-CoV-2/FLU/RSV  testing. Fact Sheet for Patients: PinkCheek.be Fact Sheet for Healthcare Providers: GravelBags.it This test is not yet approved or cleared by the [place] FDA and  has been authorized for detection and/or diagnosis of SARS-CoV-2 by  FDA under an Emergency Use Authorization (EUA). This EUA will remain  in effect (meaning this test can be used) for the duration of the  Covid-19 declaration under Section 564(b)(1) of the Act, 21  U.S.C. section 360bbb-3(b)(1), unless the authorization is  terminated or revoked. Performed at [facility], [place] [address], [place] [address]   Culture, blood (routine x 2)     Status: None   Collection Time: 02/01/19  4:51 PM   Specimen: BLOOD  Result Value Ref Range Status   Specimen Description   Final    BLOOD RIGHT ANTECUBITAL Performed at [facility] [address] [place], [place] [address]    Special Requests   Final    BOTTLES DRAWN AEROBIC AND ANAEROBIC Blood Culture adequate volume Performed at [facility] [address], [place], [place] [address]    Culture   Final    NO GROWTH 5 DAYS Performed at  [facility], [place] [address] [place], [place] [address]    Report Status 02/06/2019 FINAL  Final  MRSA PCR Screening     Status: None   Collection Time: 02/01/19 10:49 PM   Specimen: Nasal Mucosa; Nasopharyngeal  Result Value Ref Range Status   MRSA by PCR NEGATIVE NEGATIVE Final    Comment:        The GeneXpert MRSA Assay (FDA approved for NASAL specimens only), is one component of a comprehensive MRSA colonization surveillance program. It is not intended to diagnose MRSA infection nor to guide or monitor treatment for MRSA infections. Performed at [facility], [place] [address] [place], [place] [address]      Radiology Studies: DG Abd 1 View  Result Date: 02/08/2019 CLINICAL DATA:  Abdominal pain, nausea and vomiting. EXAM: ABDOMEN - 1 VIEW COMPARISON:  CT scan 01/22/2019 FINDINGS: Moderate gaseous distention of the stomach and small bowel is demonstrated consistent with a small-bowel obstruction. Some scattered air is noted in the right colon. IMPRESSION: Persistent small-bowel obstruction bowel gas pattern. No definite free air. Electronically Signed   By: Marijo Sanes M.D.   On: 02/08/2019 16:25   DG CHEST PORT 1 VIEW  Result Date: 02/09/2019 CLINICAL DATA:  Status post PICC line insertion. EXAM: PORTABLE CHEST 1 VIEW COMPARISON:  02/01/2019 FINDINGS: There is a left chest wall pacer device with lead in the right atrial appendage and right ventricle. An NG tube is identified with tip below the field of view. Right arm PICC line has been placed the tip terminates over the distal SVC. Heart size appears  normal. Small right pleural effusion identified. No airspace opacities. Chronic deformity involving the proximal right humerus noted. Advanced degenerative changes are noted at both glenohumeral joints. IMPRESSION: 1. Satisfactory position of right arm PICC line. 2. Small right pleural effusion. Electronically Signed   By: Kerby Moors M.D.   On:  02/09/2019 11:41   [place] EKG SITE RITE  Result Date: 02/09/2019 If Site Rite image not attached, placement could not be confirmed due to current cardiac rhythm.   Scheduled Meds: . bisacodyl  10 mg Rectal Daily  . Chlorhexidine Gluconate Cloth  6 each Topical Daily  . insulin aspart  0-9 Units Subcutaneous Q4H  . lip balm  1 application Topical BID  . mouth rinse  15 mL Mouth Rinse BID  . neomycin-polymyxin b-dexamethasone  1 application Right Eye QHS  . polyvinyl alcohol   Right Eye BID  . rivaroxaban  20 mg Per Tube Q supper  . sodium chloride flush  10-40 mL Intracatheter Q12H   Continuous Infusions: . amiodarone 30 mg/hr (02/10/19 1505)  . famotidine (PEPCID) IV Stopped (02/10/19 [id number])  . lactated ringers 75 mL/hr (02/10/19 1030)  . methocarbamol (ROBAXIN) IV    . ondansetron (ZOFRAN) IV    . potassium PHOSPHATE IVPB (in mmol) 43 mL/hr at 02/10/19 1504  . TPN ADULT (ION) 40 mL/hr at 02/10/19 1504  . TPN ADULT (ION)       LOS: 9 days   Marylu Lund, MD [facility] Pager On Amion  If 7PM-7AM, please contact night-coverage 02/10/2019, 4:13 PM

## 2019-02-10 NOTE — Progress Notes (Signed)
Spoke with Dr. Margaretann Loveless about HR in 140-150s s/p IV Amio gtt and 2 rounds of IV Card 5mg  today with no significant change. Patient not symptomatic and states she feels fine. Orders received to continue Amio gtt tonight and give bolus now.

## 2019-02-10 NOTE — Progress Notes (Signed)
Contacted on call provider about increase in HR and decrease in BP. Medication ordered and given as prescribed. Will continue to assess.

## 2019-02-10 NOTE — Progress Notes (Signed)
9 Days Post-Op   Subjective/Chief Complaint: Some confusion overnight, has passed some flatus and minimal out ng overnight   Objective: Vital signs in last 24 hours: Temp:  [97.5 F (36.4 C)-98.7 F (37.1 C)] 98.2 F (36.8 C) (12/31 0347) Pulse Rate:  [97-146] 109 (12/31 0500) Resp:  [9-23] 19 (12/31 0500) BP: (86-128)/(44-83) 102/83 (12/31 0500) SpO2:  [92 %-97 %] 96 % (12/31 0500) Last BM Date: 02/07/19  Intake/Output from previous day: 12/30 0701 - 12/31 0700 In: 3732.1 [I.V.:3400.7; IV Piggyback:331.4] Out: 1550 [Urine:1100; Emesis/NG output:450] Intake/Output this shift: No intake/output data recorded.  Resp: clear to auscultation bilaterally Cardio: irregularly irregular rhythm GI: wound clean with granulation tissue, some bs present, soft, approp tender  Lab Results:  Recent Labs    02/08/19 0342 02/09/19 0500  WBC 7.7 7.1  HGB 8.9* 8.1*  HCT 28.4* 25.5*  PLT 224 PLATELET CLUMPS NOTED ON SMEAR, UNABLE TO ESTIMATE   BMET Recent Labs    02/08/19 0342 02/09/19 0500  NA 141 141  K 3.8 3.6  CL 107 106  CO2 23 22  GLUCOSE 101* 98  BUN 22 27*  CREATININE 0.63 0.57  CALCIUM 8.4* 8.2*   PT/INR No results for input(s): LABPROT, INR in the last 72 hours. ABG No results for input(s): PHART, HCO3 in the last 72 hours.  Invalid input(s): PCO2, PO2  Studies/Results: DG Abd 1 View  Result Date: 02/08/2019 CLINICAL DATA:  Abdominal pain, nausea and vomiting. EXAM: ABDOMEN - 1 VIEW COMPARISON:  CT scan 01/22/2019 FINDINGS: Moderate gaseous distention of the stomach and small bowel is demonstrated consistent with a small-bowel obstruction. Some scattered air is noted in the right colon. IMPRESSION: Persistent small-bowel obstruction bowel gas pattern. No definite free air. Electronically Signed   By: Marijo Sanes M.D.   On: 02/08/2019 16:25   DG CHEST PORT 1 VIEW  Result Date: 02/09/2019 CLINICAL DATA:  Status post PICC line insertion. EXAM: PORTABLE CHEST 1  VIEW COMPARISON:  02/01/2019 FINDINGS: There is a left chest wall pacer device with lead in the right atrial appendage and right ventricle. An NG tube is identified with tip below the field of view. Right arm PICC line has been placed the tip terminates over the distal SVC. Heart size appears normal. Small right pleural effusion identified. No airspace opacities. Chronic deformity involving the proximal right humerus noted. Advanced degenerative changes are noted at both glenohumeral joints. IMPRESSION: 1. Satisfactory position of right arm PICC line. 2. Small right pleural effusion. Electronically Signed   By: Kerby Moors M.D.   On: 02/09/2019 11:41   ECHOCARDIOGRAM COMPLETE  Result Date: 02/08/2019   ECHOCARDIOGRAM REPORT   Patient Name:   Megan Hester [facility] Date of Exam: 02/08/2019 Medical Rec #:  [id number]           Height:       64.0 in Accession #:    [id number]          Weight:       142.3 lb Date of Birth:  [birth year]          BSA:          1.69 m Patient Age:    85 years            BP:           151/84 mmHg Patient Gender: F                   HR:  115 bpm. Exam Location:  Inpatient Procedure: 2D Echo, Cardiac Doppler and Color Doppler Indications:    Atrial Fibrillation 427.31  History:        Patient has prior history of Echocardiogram examinations, most                 recent 01/08/2015. Pacemaker, Arrythmias:Atrial Fibrillation,                 Signs/Symptoms:Shortness of Breath; Risk Factors:Hypertension,                 Diabetes and Non-Smoker. Sepsis.  Sonographer:    Paulita Fujita RDCS Referring Phys: [place]  1. Left ventricular ejection fraction, by visual estimation, is 50 to 55%. The left ventricle has low normal function. Left ventricular septal wall thickness was normal. Normal left ventricular posterior wall thickness. There is no left ventricular hypertrophy.  2. Left ventricular diastolic parameters are indeterminate.  3. The left ventricle has  no regional wall motion abnormalities.  4. Global right ventricle has normal systolic function.The right ventricular size is normal. No increase in right ventricular wall thickness.  5. Left atrial size was normal.  6. Right atrial size was normal.  7. The mitral valve is normal in structure. Trivial mitral valve regurgitation. No evidence of mitral stenosis.  8. The tricuspid valve is normal in structure.  9. The aortic valve is tricuspid. Aortic valve regurgitation is not visualized. No evidence of aortic valve sclerosis or stenosis. 10. The pulmonic valve was normal in structure. Pulmonic valve regurgitation is not visualized. 11. Mildly elevated pulmonary artery systolic pressure. 12. A pacer wire is visualized. 13. The inferior vena cava is normal in size with greater than 50% respiratory variability, suggesting right atrial pressure of 3 mmHg. FINDINGS  Left Ventricle: Left ventricular ejection fraction, by visual estimation, is 50 to 55%. The left ventricle has low normal function. The left ventricle has no regional wall motion abnormalities. The left ventricular internal cavity size was the left ventricle is normal in size. Normal left ventricular posterior wall thickness. There is no left ventricular hypertrophy. Left ventricular diastolic parameters are indeterminate. Normal left atrial pressure. Right Ventricle: The right ventricular size is normal. No increase in right ventricular wall thickness. Global RV systolic function is has normal systolic function. The tricuspid regurgitant velocity is 2.73 m/s, and with an assumed right atrial pressure  of 3 mmHg, the estimated right ventricular systolic pressure is mildly elevated at 32.8 mmHg. Left Atrium: Left atrial size was normal in size. Right Atrium: Right atrial size was normal in size Pericardium: There is no evidence of pericardial effusion. Mitral Valve: The mitral valve is normal in structure. Trivial mitral valve regurgitation. No evidence of  mitral valve stenosis by observation. Tricuspid Valve: The tricuspid valve is normal in structure. Tricuspid valve regurgitation is mild. Aortic Valve: The aortic valve is tricuspid. . There is mild thickening and mild calcification of the aortic valve. Aortic valve regurgitation is not visualized. The aortic valve is structurally normal, with no evidence of sclerosis or stenosis. There is  mild thickening of the aortic valve. There is mild calcification of the aortic valve. Pulmonic Valve: The pulmonic valve was normal in structure. Pulmonic valve regurgitation is not visualized. Pulmonic regurgitation is not visualized. Aorta: The aortic root, ascending aorta and aortic arch are all structurally normal, with no evidence of dilitation or obstruction. Venous: The inferior vena cava is normal in size with greater than 50% respiratory variability, suggesting  right atrial pressure of 3 mmHg. IAS/Shunts: No atrial level shunt detected by color flow Doppler. There is no evidence of a patent foramen ovale. No ventricular septal defect is seen or detected. There is no evidence of an atrial septal defect. Additional Comments: A pacer wire is visualized.  LEFT VENTRICLE PLAX 2D LVIDd:         3.70 cm LVIDs:         3.00 cm LV PW:         0.80 cm LV IVS:        0.80 cm LVOT diam:     1.80 cm LV SV:         23 ml LV SV Index:   13.47 LVOT Area:     2.54 cm  LV Volumes (MOD) LV area d, A2C:    21.60 cm LV area d, A4C:    23.90 cm LV area s, A2C:    14.70 cm LV area s, A4C:    17.30 cm LV major d, A2C:   6.14 cm LV major d, A4C:   7.28 cm LV major s, A2C:   5.04 cm LV major s, A4C:   6.30 cm LV vol d, MOD A2C: 63.0 ml LV vol d, MOD A4C: 66.5 ml LV vol s, MOD A2C: 35.6 ml LV vol s, MOD A4C: 40.6 ml LV SV MOD A2C:     27.4 ml LV SV MOD A4C:     66.5 ml LV SV MOD BP:      27.8 ml RIGHT VENTRICLE TAPSE (M-mode): 2.0 cm LEFT ATRIUM             Index       RIGHT ATRIUM           Index LA diam:        4.60 cm 2.72 cm/m  RA Area:      12.30 cm LA Vol (A2C):   47.0 ml 27.76 ml/m RA Volume:   26.80 ml  15.83 ml/m LA Vol (A4C):   35.4 ml 20.91 ml/m LA Biplane Vol: 41.1 ml 24.28 ml/m  AORTIC VALVE LVOT Vmax:   97.50 cm/s LVOT Vmean:  63.600 cm/s LVOT VTI:    0.164 m  AORTA Ao Root diam: 3.30 cm TRICUSPID VALVE TR Peak grad:   29.8 mmHg TR Vmax:        273.00 cm/s  SHUNTS Systemic VTI:  0.16 m Systemic Diam: 1.80 cm  Skeet Latch MD Electronically signed by Skeet Latch MD Signature Date/Time: 02/08/2019/3:48:15 PM    Final    [place] EKG SITE RITE  Result Date: 02/09/2019 If Site Rite image not attached, placement could not be confirmed due to current cardiac rhythm.   Anti-infectives: Anti-infectives (From admission, onward)   Start     Dose/Rate Route Frequency Ordered Stop   02/02/19 0600  meropenem (MERREM) 1 g in sodium chloride 0.9 % 100 mL IVPB     1 g 200 mL/hr over 30 Minutes Intravenous Every 12 hours 02/01/19 2243 02/04/19 0604   02/01/19 1730  clindamycin (CLEOCIN) IVPB 900 mg     900 mg 100 mL/hr over 30 Minutes Intravenous On call to O.R. 02/01/19 1654 02/01/19 2009   02/01/19 1730  gentamicin (GARAMYCIN) 310 mg in dextrose 5 % 100 mL IVPB     5 mg/kg  61.2 kg 107.8 mL/hr over 60 Minutes Intravenous On call to O.R. 02/01/19 1654 02/01/19 1957   02/01/19 1545  meropenem (MERREM) 1 g in sodium  chloride 0.9 % 100 mL IVPB     1 g 200 mL/hr over 30 Minutes Intravenous STAT 02/01/19 1533 02/01/19 1724      Assessment/Plan  POD 9 elap, sbr- Gross -appears to be resolving ileus clinically -will try to clamp ng and remove later today if tolerates -oob as much as possible, pulm toilet -continue tpn -bid dressing changes Atrial fibrillation -on Xarelto/flecainide/Cardizem DVT prophylaxis - on xarelto for afib Anemia-hct 25.5 from 28.4 yesterday, no evidence bleeding, on xarelto, will follow Mildly confused - seems to get worse at night. Not surprising given age/surgery    Rolm Bookbinder 02/10/2019

## 2019-02-10 NOTE — Progress Notes (Signed)
Spoke to on call provider about increase in BUN and other lab values. Will continue to assess.

## 2019-02-11 DIAGNOSIS — Z86010 Personal history of colonic polyps: Secondary | ICD-10-CM

## 2019-02-11 LAB — COMPREHENSIVE METABOLIC PANEL
ALT: 22 U/L (ref 0–44)
AST: 22 U/L (ref 15–41)
Albumin: 2.2 g/dL — ABNORMAL LOW (ref 3.5–5.0)
Alkaline Phosphatase: 70 U/L (ref 38–126)
Anion gap: 5 (ref 5–15)
BUN: 21 mg/dL (ref 8–23)
CO2: 27 mmol/L (ref 22–32)
Calcium: 8 mg/dL — ABNORMAL LOW (ref 8.9–10.3)
Chloride: 110 mmol/L (ref 98–111)
Creatinine, Ser: 0.52 mg/dL (ref 0.44–1.00)
GFR calc Af Amer: >60 mL/min (ref >60)
GFR calc non Af Amer: >60 mL/min (ref >60)
Glucose, Bld: 155 mg/dL — ABNORMAL HIGH (ref 70–99)
Potassium: 3.6 mmol/L (ref 3.5–5.1)
Sodium: 142 mmol/L (ref 135–145)
Total Bilirubin: 1 mg/dL (ref 0.3–1.2)
Total Protein: 4.5 g/dL — ABNORMAL LOW (ref 6.5–8.1)

## 2019-02-11 LAB — GLUCOSE, CAPILLARY
Glucose-Capillary: 152 mg/dL — ABNORMAL HIGH (ref 70–99)
Glucose-Capillary: 128 mg/dL — ABNORMAL HIGH (ref 70–99)
Glucose-Capillary: 138 mg/dL — ABNORMAL HIGH (ref 70–99)
Glucose-Capillary: 125 mg/dL — ABNORMAL HIGH (ref 70–99)
Glucose-Capillary: 100 mg/dL — ABNORMAL HIGH (ref 70–99)
Glucose-Capillary: 120 mg/dL — ABNORMAL HIGH (ref 70–99)

## 2019-02-11 LAB — CBC
HCT: 22.8 % — ABNORMAL LOW (ref 36.0–46.0)
HCT: 25.2 % — ABNORMAL LOW (ref 36.0–46.0)
Hemoglobin: 7.3 g/dL — ABNORMAL LOW (ref 12.0–15.0)
Hemoglobin: 8.1 g/dL — ABNORMAL LOW (ref 12.0–15.0)
MCH: 36.5 pg — ABNORMAL HIGH (ref 26.0–34.0)
MCH: 36.7 pg — ABNORMAL HIGH (ref 26.0–34.0)
MCHC: 32 g/dL (ref 30.0–36.0)
MCHC: 32.1 g/dL (ref 30.0–36.0)
MCV: 114 fL — ABNORMAL HIGH (ref 80.0–100.0)
MCV: 114 fL — ABNORMAL HIGH (ref 80.0–100.0)
Platelets: 221 10*3/uL (ref 150–400)
Platelets: 266 10*3/uL (ref 150–400)
RBC: 2 MIL/uL — ABNORMAL LOW (ref 3.87–5.11)
RBC: 2.21 MIL/uL — ABNORMAL LOW (ref 3.87–5.11)
RDW: 15.2 % (ref 11.5–15.5)
RDW: 15.6 % — ABNORMAL HIGH (ref 11.5–15.5)
WBC: 9.6 10*3/uL (ref 4.0–10.5)
WBC: 13.8 10*3/uL — ABNORMAL HIGH (ref 4.0–10.5)
nRBC: 0.2 % (ref 0.0–0.2)
nRBC: 0.1 % (ref 0.0–0.2)

## 2019-02-11 LAB — PHOSPHORUS: Phosphorus: 2.2 mg/dL — ABNORMAL LOW (ref 2.5–4.6)

## 2019-02-11 LAB — TYPE AND SCREEN
ABO/RH(D): O NEG
Antibody Screen: NEGATIVE

## 2019-02-11 LAB — MAGNESIUM: Magnesium: 1.8 mg/dL (ref 1.7–2.4)

## 2019-02-11 MED ORDER — TRAVASOL 10 % IV SOLN
INTRAVENOUS | Status: AC
  Filled 2019-02-11: qty 811.2

## 2019-02-11 MED ORDER — MIDAZOLAM HCL 2 MG/2ML IJ SOLN
INTRAMUSCULAR | Status: AC
  Filled 2019-02-11: qty 2

## 2019-02-11 MED ORDER — POTASSIUM PHOSPHATES 15 MMOLE/5ML IV SOLN
15.0000 mmol | Freq: Once | INTRAVENOUS | Status: AC
  Administered 2019-02-11: 15 mmol via INTRAVENOUS
  Filled 2019-02-11: qty 5

## 2019-02-11 MED ORDER — LACTATED RINGERS IV SOLN: INTRAVENOUS | Status: AC

## 2019-02-11 NOTE — Progress Notes (Signed)
PHARMACY - TOTAL PARENTERAL NUTRITION CONSULT NOTE   Indication: Post-operative ileus  Patient Measurements: Height: 5\' 4"  (162.6 cm) Weight: 143 lb 8.3 oz (65.1 kg) IBW/kg (Calculated) : 54.7 TPN AdjBW (KG): 61.2 Body mass index is 24.64 kg/m.  Recent Labs    02/10/19 0805 02/11/19 0500  NA 145 142  K 3.1* 3.6  CL 107 110  CO2 27 27  GLUCOSE 159* 155*  BUN 26* 21  CREATININE 0.57 0.52  CALCIUM 8.1* 8.0*  PHOS 2.0* 2.2*  MG 2.1 1.8  ALBUMIN 2.6* 2.2*  ALKPHOS 71 70  AST 19 22  ALT 21 22  BILITOT 1.4* 1.0  TRIG 99  --   PREALBUMIN 8.8*  --      Intake/Output Summary (Last 24 hours) at 02/11/2019 0851 Last data filed at 02/11/2019 [id number] Gross per 24 hour  Intake 2246.85 ml  Output 1100 ml  Net 1146.85 ml    Assessment:   Glucose / Insulin: CBGs 128-152 with 1 reading of 55 around 11pm and then 227 30 min later after correction of low CBG since TPN started yesterday PM. VBG 155.  4 units SSI used in past 12 hrs Electrolytes: stable except for Phos low but improved at 2.2 Renal: SCr WNL, BUN improved LFTs / TGs: all WNL Prealbumin / albumin: Prealbumin 8.8 today; albumin low Intake / Output; MIVF: I/O (+) 1.1 L; mIVF LR at 75 mL/hr GI Imaging: Abd XR 12/29: "persistent SBO gas pattern" Surgeries / Procedures:  12/22: LAPAROSCOPY DIAGNOSTIC WITH LYSIS OF ADHESION SMALL BOWEL RESECTION TAP BLOCK PRIMARY UMBILICAL HERNIA REPAIR APPLICATION OF WOUND VAC  Central access: PICC line placement ordered by MD for 12/30 TPN start date:  12/30 after PICC placed  Nutritional Goals per RD recommendation 12/30: 90-105 grams protein/d 1845-2030 KCal/d  Goal TPN rate (using formula of amino acids 52 g/L, dextrose 14%, and lipids 30 g/L)  is 80 mL/hr (provides 100 g of protein and 1889 kcals per day)  Current Nutrition:  TPN at 55 mL/hr  Plan:  Now: KPhos 15 mM IV x 1  At 1800: -Increase TPN to 65 mL/hr -Continue Na at reduced 20 mEq/L in TPN -Continue K at 55  mEq/L in TPN - Increase phos to 20 mMol/L in TPN Other electrolytes in TPN: 94mEq/L of Ca, 59mEq/L of Mg, Cl:Ac ratio 1:1 Add standard MVI to TPN MWF and trace elements to TPN daily Reduce mIVF from 75 to 50 ml/hr Continue SSI Sensitive q4h   CMet, Mg, Phos tomorrow Monitor TPN labs on Mon/Thurs   Adrian Saran, PharmD, BCPS 02/11/2019 8:52 AM

## 2019-02-11 NOTE — Progress Notes (Signed)
PROGRESS NOTE    Megan Hester  [id number] DOB: March 10, 1930 DOA: 02/01/2019 PCP: [name], MD    Brief Narrative:  86 y.o. F with A. fib, SSS with pacer, on Xarelto, HTN, history DCIS, and enucleation of the right eye who presented with 1 day abdominal pain nausea and vomiting.  In the ER, she was hypotensive, had AKI, and CT of the abdomen and pelvis showed closed-loop obstruction with possible early ischemia.  She underwent urgent surgical intervention with LOA, small bowel resection, and application of wound VAC.  Interim course: Postoperative day she has had some delirium. She is also developed A. fib with RVR, for which cardiology was consulted she was started on amiodarone drip, but this was stopped due to elevated LFTs.   Assessment & Plan:   Principal Problem:   Closed loop SBO (small bowel obstruction)  Active Problems:   A-fib (HCC)   Hypertension   Sick sinus syndrome (HCC)   Sepsis (HCC)   Chronic anticoagulation   Cardiac pacemaker in place - [employer] [id number] Advisa DR MRI   Rectocele   DM (diabetes mellitus) ([place])   History of adenomatous polyp of colon   Bowel obstruction (HCC)   Acute focal ischemia of small intestine (HCC)  Small bowel obstruction S/p laparoscopic LOA, small bowel resection 12/22 by Dr. Johney Maine Postop day 10 -Recent concerns of post-op ileus with NG placed. Now improving -Planning for removing NG 1/1 per surgery -Wound VAC per general surgery -Started TNA per General Surgery -Some brbpr noted while on xarelto -Repeat CBC pending this afternoon, if trends down, then would hold xarelto. Per General Surgery. OK to hold Xarelto per Cardiology  Chronic atrial fibrillation with RVR Sick sinus syndrome with pacer Had been on flecainide, cannot restart for 2 weeks.  Has been on amiodarone, but LFTs were rising, so we would like to avoid this.  HR has not responded well to beta blockade.   -Currently continued on Xarelto. Per  Surgery, pt can take PO cardiac meds -Cardiology following. Issues with hypotension when attempting to control rate --Now with amiodarone gtt and PRN IV cardizem -HR currently remains in the low-100's, pt asymptomatic. Will follow up on Cardiology recommendations  Delirium -Post-op delirium expected in 86 yo. -Seems stable this AM -continue with delirium precautions:              -Lights and TV off, minimize interruptions at night             -Blinds open and lights on during day             -Glasses/hearing aid with patient             -Frequent reorientation             -PT/OT when able             -Cont to avoid sedation medications/[place] list medications   Hypertension -BP remains soft -Continue on metoprolol PRN -see above, plan for PRN IV cardizem for rate control if needed. This afternoon, given 5mg  IV cardizem with little effect. Will follow up on Cardiology recs  AKI -Cr 1.9 at admission -Now resolved, recheck bmet in AM  Sepsis ruled out -Patient presented with SIRS physiology and elevated lactate. There was initial concern for sepsis, She was treated with 3 days of meropenem, but this resolved after small bowel resection, and she has had no further signs of infection, and likely her SIRS was from bowel ischemia. -remains stable at this  time  Acute blood loss anemia  Macrocytic, B12, TSH and folate normal. -some BRBPR noted overnight -Hgb this AM 7.3 from 8.3 yesterday -Repeat CBC ordered for 3pm today. Will follow. If cont to trend down, would hold xarelto per above -repeat CBC in AM   Other medications -Continue eye drops -Anticipate resuming sertraline at time of discharge   DVT prophylaxis: Xarelto Code Status: Full Family Communication: Pt in room, family not at bedside Disposition Plan: Uncertain at this time  Consultants:   Cardiology  General Surgery  Procedures:   12/22 Lap LOA and small bowel resection  Antimicrobials: Anti-infectives  (From admission, onward)   Start     Dose/Rate Route Frequency Ordered Stop   02/02/19 0600  meropenem (MERREM) 1 g in sodium chloride 0.9 % 100 mL IVPB     1 g 200 mL/hr over 30 Minutes Intravenous Every 12 hours 02/01/19 2243 02/04/19 0604   02/01/19 1730  clindamycin (CLEOCIN) IVPB 900 mg     900 mg 100 mL/hr over 30 Minutes Intravenous On call to O.R. 02/01/19 1654 02/01/19 2009   02/01/19 1730  gentamicin (GARAMYCIN) 310 mg in dextrose 5 % 100 mL IVPB     5 mg/kg  61.2 kg 107.8 mL/hr over 60 Minutes Intravenous On call to O.R. 02/01/19 1654 02/01/19 1957   02/01/19 1545  meropenem (MERREM) 1 g in sodium chloride 0.9 % 100 mL IVPB     1 g 200 mL/hr over 30 Minutes Intravenous STAT 02/01/19 1533 02/01/19 1724      Subjective: Without complaints this AM. Denies chest pain or sob  Objective: Vitals:   02/11/19 0807 02/11/19 0900 02/11/19 1000 02/11/19 1200  BP: 130/76 (!) 140/96 101/68   Pulse: (!) 138 94 (!) 101   Resp: (!) 24 (!) 24 (!) 23   Temp: 97.6 F (36.4 C)   97.7 F (36.5 C)  TempSrc: Oral   Oral  SpO2: 100% 97% 97%   Weight:      Height:        Intake/Output Summary (Last 24 hours) at 02/11/2019 1410 Last data filed at 02/11/2019 0800 Gross per 24 hour  Intake 3120.05 ml  Output 1100 ml  Net 2020.05 ml   Filed Weights   02/08/19 0913 02/09/19 0500 02/11/19 0436  Weight: 64.5 kg 65.5 kg 65.1 kg    Examination: General exam: Conversant, in no acute distress Respiratory system: normal chest rise, clear, no audible wheezing Cardiovascular system: tachycardic, s1-s2 Gastrointestinal system: Nondistended, nontender, pos BS Central nervous system: No seizures, no tremors Extremities: No cyanosis, no joint deformities Skin: No rashes, no pallor Psychiatry: Affect normal // no auditory hallucinations   Data Reviewed: I have personally reviewed following labs and imaging studies  CBC: Recent Labs  Lab 02/06/19 0214 02/07/19 0404 02/08/19 0342  02/09/19 0500 02/10/19 0805 02/11/19 0500  WBC 6.5 10.1 7.7 7.1 8.6 9.6  NEUTROABS 4.4  --   --   --  6.6  --   HGB 9.1* 9.3* 8.9* 8.1* 8.3* 7.3*  HCT 28.2* 28.1* 28.4* 25.5* 26.6* 22.8*  MCV 110.2* 111.5* 112.7* 113.3* 114.7* 114.0*  PLT 181 209 224 PLATELET CLUMPS NOTED ON SMEAR, UNABLE TO ESTIMATE 227 [id number]   Basic Metabolic Panel: Recent Labs  Lab 02/05/19 0841 02/06/19 0214 02/07/19 0404 02/08/19 0342 02/09/19 0500 02/10/19 0805 02/11/19 0500  NA 144 139 140 141 141 145 142  K 3.4* 3.0* 4.0 3.8 3.6 3.1* 3.6  CL 109 106 109 107 106 107  110  CO2 25 24 22 23 22 27 27   GLUCOSE 87 169* 103* 101* 98 159* 155*  BUN 28* 19 19 22  27* 26* 21  CREATININE 0.62 0.63 0.54 0.63 0.57 0.57 0.52  CALCIUM 8.7* 8.2* 8.1* 8.4* 8.2* 8.1* 8.0*  MG 2.0 1.8 1.9 2.0 2.0 2.1 1.8  PHOS 2.3* 3.0  --   --   --  2.0* 2.2*   GFR: Estimated Creatinine Clearance: 43.6 mL/min (by C-G formula based on SCr of 0.52 mg/dL). Liver Function Tests: Recent Labs  Lab 02/07/19 0404 02/08/19 0342 02/09/19 0500 02/10/19 0805 02/11/19 0500  AST 24 27 22 19 22   ALT 19 24 20 21 22   ALKPHOS 87 81 71 71 70  BILITOT 2.0* 1.7* 1.7* 1.4* 1.0  PROT 4.8* 5.0* 4.9* 4.8* 4.5*  ALBUMIN 2.5* 2.6* 2.6* 2.6* 2.2*   No results for input(s): LIPASE, AMYLASE in the last 168 hours. No results for input(s): AMMONIA in the last 168 hours. Coagulation Profile: No results for input(s): INR, PROTIME in the last 168 hours. Cardiac Enzymes: No results for input(s): CKTOTAL, CKMB, CKMBINDEX, TROPONINI in the last 168 hours. BNP (last 3 results) No results for input(s): PROBNP in the last 8760 hours. HbA1C: No results for input(s): HGBA1C in the last 72 hours. CBG: Recent Labs  Lab 02/10/19 2307 02/10/19 2340 02/11/19 0323 02/11/19 0744 02/11/19 1123  GLUCAP 55* 227* 152* 128* 138*   Lipid Profile: Recent Labs    02/10/19 0805  TRIG 99   Thyroid Function Tests: No results for input(s): TSH, T4TOTAL, FREET4,  T3FREE, THYROIDAB in the last 72 hours. Anemia Panel: No results for input(s): VITAMINB12, FOLATE, FERRITIN, TIBC, IRON, RETICCTPCT in the last 72 hours. Sepsis Labs: No results for input(s): PROCALCITON, LATICACIDVEN in the last 168 hours.  Recent Results (from the past 240 hour(s))  Culture, blood (routine x 2)     Status: None   Collection Time: 02/01/19  4:35 PM   Specimen: BLOOD  Result Value Ref Range Status   Specimen Description   Final    BLOOD RIGHT WRIST Performed at [facility] [address] [place], [place] [address]    Special Requests   Final    BOTTLES DRAWN AEROBIC AND ANAEROBIC Blood Culture adequate volume Performed at [facility] [address], [place], [place] [address]    Culture   Final    NO GROWTH 5 DAYS Performed at [facility], [place] [address] [place], [place] [address]    Report Status 02/06/2019 FINAL  Final  Respiratory Panel by RT PCR (Flu A&B, Covid) - Nasopharyngeal Swab     Status: None   Collection Time: 02/01/19  4:38 PM   Specimen: Nasopharyngeal Swab  Result Value Ref Range Status   SARS Coronavirus 2 by RT PCR NEGATIVE NEGATIVE Final    Comment: (NOTE) SARS-CoV-2 target nucleic acids are NOT DETECTED. The SARS-CoV-2 RNA is generally detectable in upper respiratoy specimens during the acute phase of infection. The lowest concentration of SARS-CoV-2 viral copies this assay can detect is 131 copies/mL. A negative result does not preclude SARS-Cov-2 infection and should not be used as the sole basis for treatment or other patient management decisions. A negative result may occur with  improper specimen collection/handling, submission of specimen other than nasopharyngeal swab, presence of viral mutation(s) within the areas targeted by this assay, and inadequate number of viral copies (<131 copies/mL). A negative result must be combined with clinical observations,  patient history, and  epidemiological information. The expected result is Negative. Fact Sheet for Patients:  PinkCheek.be Fact Sheet for Healthcare Providers:  GravelBags.it This test is not yet ap proved or cleared by the [place] FDA and  has been authorized for detection and/or diagnosis of SARS-CoV-2 by FDA under an Emergency Use Authorization (EUA). This EUA will remain  in effect (meaning this test can be used) for the duration of the COVID-19 declaration under Section 564(b)(1) of the Act, 21 U.S.C. section 360bbb-3(b)(1), unless the authorization is terminated or revoked sooner.    Influenza A by PCR NEGATIVE NEGATIVE Final   Influenza B by PCR NEGATIVE NEGATIVE Final    Comment: (NOTE) The Xpert Xpress SARS-CoV-2/FLU/RSV assay is intended as an aid in  the diagnosis of influenza from Nasopharyngeal swab specimens and  should not be used as a sole basis for treatment. Nasal washings and  aspirates are unacceptable for Xpert Xpress SARS-CoV-2/FLU/RSV  testing. Fact Sheet for Patients: PinkCheek.be Fact Sheet for Healthcare Providers: GravelBags.it This test is not yet approved or cleared by the [place] FDA and  has been authorized for detection and/or diagnosis of SARS-CoV-2 by  FDA under an Emergency Use Authorization (EUA). This EUA will remain  in effect (meaning this test can be used) for the duration of the  Covid-19 declaration under Section 564(b)(1) of the Act, 21  U.S.C. section 360bbb-3(b)(1), unless the authorization is  terminated or revoked. Performed at [facility], [place] [address], [place], [place] [address]   Culture, blood (routine x 2)     Status: None   Collection Time: 02/01/19  4:51 PM   Specimen: BLOOD  Result Value Ref Range Status   Specimen Description   Final    BLOOD RIGHT ANTECUBITAL Performed at [facility] [address] [place], [place] [address]    Special Requests   Final    BOTTLES DRAWN AEROBIC AND ANAEROBIC Blood Culture adequate volume Performed at [facility] [address] [place], [place] [address]    Culture   Final    NO GROWTH 5 DAYS Performed at [facility], [place] [address], [place], [place] [address]    Report Status 02/06/2019 FINAL  Final  MRSA PCR Screening     Status: None   Collection Time: 02/01/19 10:49 PM   Specimen: Nasal Mucosa; Nasopharyngeal  Result Value Ref Range Status   MRSA by PCR NEGATIVE NEGATIVE Final    Comment:        The GeneXpert MRSA Assay (FDA approved for NASAL specimens only), is one component of a comprehensive MRSA colonization surveillance program. It is not intended to diagnose MRSA infection nor to guide or monitor treatment for MRSA infections. Performed at [facility], [place] [address] [place], [place] [address]      Radiology Studies: No results found.  Scheduled Meds: . bisacodyl  10 mg Rectal Daily  . Chlorhexidine Gluconate Cloth  6 each Topical Daily  . insulin aspart  0-9 Units Subcutaneous Q4H  . lip balm  1 application Topical BID  . mouth rinse  15 mL Mouth Rinse BID  . midazolam      . neomycin-polymyxin b-dexamethasone  1 application Right Eye QHS  . polyvinyl alcohol   Right Eye BID  . rivaroxaban  20 mg Per Tube Q supper  . sodium chloride flush  10-40 mL Intracatheter Q12H   Continuous Infusions: . amiodarone 30 mg/hr (02/11/19 1144)  .  famotidine (PEPCID) IV Stopped (02/11/19 1018)  . lactated ringers Stopped (02/11/19 1052)  . lactated ringers    . methocarbamol (ROBAXIN) IV    . ondansetron (ZOFRAN) IV    . potassium PHOSPHATE IVPB (in mmol) 15 mmol (02/11/19 1052)  . TPN ADULT (ION) 55 mL/hr at 02/11/19 0800  . TPN ADULT (ION)       LOS: 10 days   Marylu Lund, MD [facility] Pager On Amion  If 7PM-7AM, please contact  night-coverage 02/11/2019, 2:10 PM

## 2019-02-11 NOTE — Progress Notes (Signed)
Progress Note  Patient Name: Megan Hester Date of Encounter: 02/11/2019  Primary Cardiologist: Donato Heinz, MD   Subjective   Anemia overnight and BRBPR.  Overall no CP or SOB. No signif palpitations  Inpatient Medications    Scheduled Meds: . bisacodyl  10 mg Rectal Daily  . Chlorhexidine Gluconate Cloth  6 each Topical Daily  . insulin aspart  0-9 Units Subcutaneous Q4H  . lip balm  1 application Topical BID  . mouth rinse  15 mL Mouth Rinse BID  . neomycin-polymyxin b-dexamethasone  1 application Right Eye QHS  . polyvinyl alcohol   Right Eye BID  . rivaroxaban  20 mg Per Tube Q supper  . sodium chloride flush  10-40 mL Intracatheter Q12H   Continuous Infusions: . amiodarone 30 mg/hr (02/11/19 0800)  . famotidine (PEPCID) IV Stopped (02/10/19 2211)  . lactated ringers 75 mL/hr at 02/11/19 0800  . lactated ringers    . methocarbamol (ROBAXIN) IV    . ondansetron (ZOFRAN) IV    . potassium PHOSPHATE IVPB (in mmol)    . TPN ADULT (ION) 55 mL/hr at 02/11/19 0800  . TPN ADULT (ION)     PRN Meds: alum & mag hydroxide-simeth, clobetasol cream, diltiazem, diphenhydrAMINE, enalaprilat, haloperidol lactate, magic mouthwash, methocarbamol (ROBAXIN) IV, metoprolol tartrate, morphine injection, naphazoline-glycerin, OLANZapine, ondansetron (ZOFRAN) IV **OR** ondansetron (ZOFRAN) IV, phenol, prochlorperazine, simethicone, sodium chloride flush, sodium chloride flush   Vital Signs    Vitals:   02/11/19 0530 02/11/19 0715 02/11/19 0807 02/11/19 0900  BP: 119/68 (!) 130/113 130/76 (!) 140/96  Pulse: (!) 129 (!) 120 (!) 138 94  Resp: 20 (!) 28 (!) 24 (!) 24  Temp:   97.6 F (36.4 C)   TempSrc:   Oral   SpO2: 100% 98% 100% 97%  Weight:      Height:        Intake/Output Summary (Last 24 hours) at 02/11/2019 0937 Last data filed at 02/11/2019 0800 Gross per 24 hour  Intake 3974.32 ml  Output 1100 ml  Net 2874.32 ml   Last 3 Weights 02/11/2019 02/10/2019  02/09/2019  Weight (lbs) 143 lb 8.3 oz (No Data) 144 lb 6.4 oz  Weight (kg) 65.1 kg (No Data) 65.5 kg      Telemetry    afib rates 110-130 - Personally Reviewed  ECG    No new - Personally Reviewed  Physical Exam   GEN: No acute distress.   Neck: No JVD Cardiac: irregular, tachycardic, no murmurs, rubs, or gallops.  Respiratory: Clear to auscultation bilaterally. GI: Soft, nontender, non-distended  MS: No edema; No deformity. Neuro:  Nonfocal  Psych: Normal affect   Labs    High Sensitivity Troponin:  No results for input(s): TROPONINIHS in the last 720 hours.    Chemistry Recent Labs  Lab 02/09/19 0500 02/10/19 0805 02/11/19 0500  NA 141 145 142  K 3.6 3.1* 3.6  CL 106 107 110  CO2 22 27 27   GLUCOSE 98 159* 155*  BUN 27* 26* 21  CREATININE 0.57 0.57 0.52  CALCIUM 8.2* 8.1* 8.0*  PROT 4.9* 4.8* 4.5*  ALBUMIN 2.6* 2.6* 2.2*  AST 22 19 22   ALT 20 21 22   ALKPHOS 71 71 70  BILITOT 1.7* 1.4* 1.0  GFRNONAA >60 >60 >60  GFRAA >60 >60 >60  ANIONGAP 13 11 5      Hematology Recent Labs  Lab 02/09/19 0500 02/10/19 0805 02/11/19 0500  WBC 7.1 8.6 9.6  RBC 2.25* 2.32* 2.00*  HGB 8.1* 8.3* 7.3*  HCT 25.5* 26.6* 22.8*  MCV 113.3* 114.7* 114.0*  MCH 36.0* 35.8* 36.5*  MCHC 31.8 31.2 32.0  RDW 15.2 15.0 15.2  PLT PLATELET CLUMPS NOTED ON SMEAR, UNABLE TO ESTIMATE 227 221    BNPNo results for input(s): BNP, PROBNP in the last 168 hours.   DDimer No results for input(s): DDIMER in the last 168 hours.   Radiology    DG CHEST PORT 1 VIEW  Result Date: 02/09/2019 CLINICAL DATA:  Status post PICC line insertion. EXAM: PORTABLE CHEST 1 VIEW COMPARISON:  02/01/2019 FINDINGS: There is a left chest wall pacer device with lead in the right atrial appendage and right ventricle. An NG tube is identified with tip below the field of view. Right arm PICC line has been placed the tip terminates over the distal SVC. Heart size appears normal. Small right pleural effusion  identified. No airspace opacities. Chronic deformity involving the proximal right humerus noted. Advanced degenerative changes are noted at both glenohumeral joints. IMPRESSION: 1. Satisfactory position of right arm PICC line. 2. Small right pleural effusion. Electronically Signed   By: Kerby Moors M.D.   On: 02/09/2019 11:41    Cardiac Studies   Echocardiogram 02/08/2019:  1. Left ventricular ejection fraction, by visual estimation, is 50 to 55%. The left ventricle has low normal function. Left ventricular septal wall thickness was normal. Normal left ventricular posterior wall thickness. There is no left ventricular  hypertrophy. 2. Left ventricular diastolic parameters are indeterminate. 3. The left ventricle has no regional wall motion abnormalities. 4. Global right ventricle has normal systolic function.The right ventricular size is normal. No increase in right ventricular wall thickness. 5. Left atrial size was normal. 6. Right atrial size was normal. 7. The mitral valve is normal in structure. Trivial mitral valve regurgitation. No evidence of mitral stenosis. 8. The tricuspid valve is normal in structure. 9. The aortic valve is tricuspid. Aortic valve regurgitation is not visualized. No evidence of aortic valve sclerosis or stenosis. 10. The pulmonic valve was normal in structure. Pulmonic valve regurgitation is not visualized. 11. Mildly elevated pulmonary artery systolic pressure. 12. A pacer wire is visualized. 13. The inferior vena cava is normal in size with greater than 50% respiratory variability, suggesting right atrial pressure of 3 mmHg  Patient Profile     86 y.o. female with a hx of sick sinus syndromes/p PPM, atrial fibrillation on Xarelto, hypertension, GERD who presents with small bowel obstructions/p small bowel resection.She is postop day 2 from her procedure. We are consulted by Dr. Jerry Caras the evaluation of atrial fibrillation with  RVR.   Assessment & Plan    1. Paroxysmal atrial fibrillation: -Previously on flecainide with recommendations for holding for 2 weekstherefore this was discontinued>>Heart rates noted to be suboptimal 02/08/2019 and she was given diltiazem 10 mg IV. Plan was to startp.o. diltiazem 30 mg every 6 hours however it appears that this was not started in the setting of hypotensionand poor gut function -on IV amiodarone now to help rate control while recovering from GI process. -Unable to further titrate BB given soft BPs  -Anticoagulated with Xarelto 20 mg daily - this may need to be held in the setting of BRBPR and acute anemia. OK to hold, and resume when safe from surgical standpoint.  This patients CHA2DS2-VASc Score and unadjusted Ischemic Stroke Rate (% per year) is equal to 4.8 % stroke rate/year from a score of 4 Above score calculated as 1 point each if present [  CHF, HTN, DM, Vascular=MI/PAD/Aortic Plaque, Age if 30-74, or Female] Above score calculated as 2 points each if present [Age > 75, or Stroke/TIA/TE]  2. Sick sinus syndrome: -PPM in place  3. Essential hypertension: -Soft BPs today  4. Small bowel resection with lysis of adhesions: -Per primary/surgical teams  For questions or updates, please contact [place] Please consult [email or web address] for contact info under        Signed, [name], MD  02/11/2019, 9:37 AM

## 2019-02-11 NOTE — Progress Notes (Signed)
10 Days Post-Op   Subjective/Chief Complaint: Had bm with bloody streaks, no n/v with tube clamped   Objective: Vital signs in last 24 hours: Temp:  [97.6 F (36.4 C)-98.2 F (36.8 C)] 97.9 F (36.6 C) (01/01 0328) Pulse Rate:  [67-144] 120 (01/01 0715) Resp:  [14-31] 28 (01/01 0715) BP: (81-148)/(51-125) 130/113 (01/01 0715) SpO2:  [96 %-100 %] 98 % (01/01 0715) Weight:  [65.1 kg] 65.1 kg (01/01 0436) Last BM Date: 02/10/19  Intake/Output from previous day: 12/31 0701 - 01/01 0700 In: 2355.8 [P.O.:120; I.V.:1618.5; NG/GT:60; IV Piggyback:557.3] Out: 1100 [Urine:1100] Intake/Output this shift: No intake/output data recorded.  Resp: clear to auscultation bilaterally Cardio: irregularly irregular rhythm GI: wound clean with granulation tissue, some bs present, soft, approp tender  Lab Results:  Recent Labs    02/10/19 0805 02/11/19 0500  WBC 8.6 9.6  HGB 8.3* 7.3*  HCT 26.6* 22.8*  PLT 227 221   BMET Recent Labs    02/10/19 0805 02/11/19 0500  NA 145 142  K 3.1* 3.6  CL 107 110  CO2 27 27  GLUCOSE 159* 155*  BUN 26* 21  CREATININE 0.57 0.52  CALCIUM 8.1* 8.0*   PT/INR No results for input(s): LABPROT, INR in the last 72 hours. ABG No results for input(s): PHART, HCO3 in the last 72 hours.  Invalid input(s): PCO2, PO2  Studies/Results: DG CHEST PORT 1 VIEW  Result Date: 02/09/2019 CLINICAL DATA:  Status post PICC line insertion. EXAM: PORTABLE CHEST 1 VIEW COMPARISON:  02/01/2019 FINDINGS: There is a left chest wall pacer device with lead in the right atrial appendage and right ventricle. An NG tube is identified with tip below the field of view. Right arm PICC line has been placed the tip terminates over the distal SVC. Heart size appears normal. Small right pleural effusion identified. No airspace opacities. Chronic deformity involving the proximal right humerus noted. Advanced degenerative changes are noted at both glenohumeral joints. IMPRESSION: 1.  Satisfactory position of right arm PICC line. 2. Small right pleural effusion. Electronically Signed   By: Kerby Moors M.D.   On: 02/09/2019 11:41    Anti-infectives: Anti-infectives (From admission, onward)   Start     Dose/Rate Route Frequency Ordered Stop   02/02/19 0600  meropenem (MERREM) 1 g in sodium chloride 0.9 % 100 mL IVPB     1 g 200 mL/hr over 30 Minutes Intravenous Every 12 hours 02/01/19 2243 02/04/19 0604   02/01/19 1730  clindamycin (CLEOCIN) IVPB 900 mg     900 mg 100 mL/hr over 30 Minutes Intravenous On call to O.R. 02/01/19 1654 02/01/19 2009   02/01/19 1730  gentamicin (GARAMYCIN) 310 mg in dextrose 5 % 100 mL IVPB     5 mg/kg  61.2 kg 107.8 mL/hr over 60 Minutes Intravenous On call to O.R. 02/01/19 1654 02/01/19 1957   02/01/19 1545  meropenem (MERREM) 1 g in sodium chloride 0.9 % 100 mL IVPB     1 g 200 mL/hr over 30 Minutes Intravenous STAT 02/01/19 1533 02/01/19 1724      Assessment/Plan: POD 10 elap, sbr- Gross -appears to be resolving ileus clinically -will remove ng today, leave on sips/chips, will continue tpn until tolerating po -oob as much as possible, pulm toilet -bid dressing changes Atrial fibrillation -on Xarelto/flecainide/Cardizem DVT prophylaxis - on xarelto for afib Anemia-hct 22.8 from 25.5  yesterday, ? Staple line bleed not very significant it appears, I think reasonable to continue xarelto and follow her, will write to  recheck cbc later today, if continues to drop will need xarelto stopped and possible transfusion, If this is source the vast majority of these will cease on their own Mildly confused - seems to get worse at night. Not surprising given age/surgery  Rolm Bookbinder 02/11/2019

## 2019-02-12 DIAGNOSIS — I4821 Permanent atrial fibrillation: Secondary | ICD-10-CM

## 2019-02-12 DIAGNOSIS — E440 Moderate protein-calorie malnutrition: Secondary | ICD-10-CM | POA: Insufficient documentation

## 2019-02-12 DIAGNOSIS — D72829 Elevated white blood cell count, unspecified: Secondary | ICD-10-CM

## 2019-02-12 LAB — BASIC METABOLIC PANEL
Anion gap: 7 (ref 5–15)
BUN: 18 mg/dL (ref 8–23)
CO2: 24 mmol/L (ref 22–32)
Calcium: 7.9 mg/dL — ABNORMAL LOW (ref 8.9–10.3)
Chloride: 107 mmol/L (ref 98–111)
Creatinine, Ser: 0.52 mg/dL (ref 0.44–1.00)
GFR calc Af Amer: >60 mL/min (ref >60)
GFR calc non Af Amer: >60 mL/min (ref >60)
Glucose, Bld: 166 mg/dL — ABNORMAL HIGH (ref 70–99)
Potassium: 3.6 mmol/L (ref 3.5–5.1)
Sodium: 138 mmol/L (ref 135–145)

## 2019-02-12 LAB — GLUCOSE, CAPILLARY
Glucose-Capillary: 136 mg/dL — ABNORMAL HIGH (ref 70–99)
Glucose-Capillary: 131 mg/dL — ABNORMAL HIGH (ref 70–99)
Glucose-Capillary: 129 mg/dL — ABNORMAL HIGH (ref 70–99)
Glucose-Capillary: 107 mg/dL — ABNORMAL HIGH (ref 70–99)
Glucose-Capillary: 128 mg/dL — ABNORMAL HIGH (ref 70–99)
Glucose-Capillary: 116 mg/dL — ABNORMAL HIGH (ref 70–99)

## 2019-02-12 LAB — PHOSPHORUS: Phosphorus: 3 mg/dL (ref 2.5–4.6)

## 2019-02-12 LAB — CBC
HCT: 23.8 % — ABNORMAL LOW (ref 36.0–46.0)
Hemoglobin: 7.6 g/dL — ABNORMAL LOW (ref 12.0–15.0)
MCH: 36.4 pg — ABNORMAL HIGH (ref 26.0–34.0)
MCHC: 31.9 g/dL (ref 30.0–36.0)
MCV: 113.9 fL — ABNORMAL HIGH (ref 80.0–100.0)
Platelets: 243 10*3/uL (ref 150–400)
RBC: 2.09 MIL/uL — ABNORMAL LOW (ref 3.87–5.11)
RDW: 15.1 % (ref 11.5–15.5)
WBC: 12.6 10*3/uL — ABNORMAL HIGH (ref 4.0–10.5)
nRBC: 0.2 % (ref 0.0–0.2)

## 2019-02-12 MED ORDER — METOPROLOL TARTRATE 25 MG PO TABS
25.0000 mg | ORAL_TABLET | Freq: Two times a day (BID) | ORAL | Status: DC
  Administered 2019-02-12 – 2019-02-13 (×3): 25 mg via ORAL
  Filled 2019-02-12 (×2): qty 1

## 2019-02-12 MED ORDER — TRAVASOL 10 % IV SOLN
INTRAVENOUS | Status: AC
  Filled 2019-02-12: qty 998.4

## 2019-02-12 MED ORDER — MELATONIN 5 MG PO TABS
5.0000 mg | ORAL_TABLET | Freq: Every day | ORAL | Status: DC
  Administered 2019-02-12 – 2019-02-15 (×4): 5 mg via ORAL
  Filled 2019-02-12 (×4): qty 1

## 2019-02-12 NOTE — Progress Notes (Signed)
Patient becomes increasingly confused at night. Patient has pulled her PICC out. PICC cathter intacted and site covered with gauze. IVT has been consulted.

## 2019-02-12 NOTE — Progress Notes (Signed)
11 Days Post-Op   Subjective/Chief Complaint: No n/v states having flatus and bm, f/u hct yesterday was higher   Objective: Vital signs in last 24 hours: Temp:  [97.6 F (36.4 C)-98.1 F (36.7 C)] 97.6 F (36.4 C) (01/02 0742) Pulse Rate:  [94-146] 125 (01/02 0700) Resp:  [20-34] 24 (01/02 0700) BP: (101-151)/(68-99) 107/77 (01/02 0700) SpO2:  [97 %-100 %] 97 % (01/02 0700) Weight:  [65.1 kg] 65.1 kg (01/02 0449) Last BM Date: 02/11/19  Intake/Output from previous day: 01/01 0701 - 01/02 0700 In: 4171.1 [I.V.:4071.4; IV Piggyback:99.7] Out: 200 [Urine:200] Intake/Output this shift: No intake/output data recorded.  Resp:clear to auscultation bilaterally Cardio:irregularly irregular rhythm [id number] clean with granulation tissue, some bs present, soft, approp tender   Lab Results:  Recent Labs    02/11/19 0500 02/11/19 1530  WBC 9.6 13.8*  HGB 7.3* 8.1*  HCT 22.8* 25.2*  PLT 221 266   BMET Recent Labs    02/11/19 0500 02/12/19 0430  NA 142 138  K 3.6 3.6  CL 110 107  CO2 27 24  GLUCOSE 155* 166*  BUN 21 18  CREATININE 0.52 0.52  CALCIUM 8.0* 7.9*   PT/INR No results for input(s): LABPROT, INR in the last 72 hours. ABG No results for input(s): PHART, HCO3 in the last 72 hours.  Invalid input(s): PCO2, PO2  Studies/Results: No results found.  Anti-infectives: Anti-infectives (From admission, onward)   Start     Dose/Rate Route Frequency Ordered Stop   02/02/19 0600  meropenem (MERREM) 1 g in sodium chloride 0.9 % 100 mL IVPB     1 g 200 mL/hr over 30 Minutes Intravenous Every 12 hours 02/01/19 2243 02/04/19 0604   02/01/19 1730  clindamycin (CLEOCIN) IVPB 900 mg     900 mg 100 mL/hr over 30 Minutes Intravenous On call to O.R. 02/01/19 1654 02/01/19 2009   02/01/19 1730  gentamicin (GARAMYCIN) 310 mg in dextrose 5 % 100 mL IVPB     5 mg/kg  61.2 kg 107.8 mL/hr over 60 Minutes Intravenous On call to O.R. 02/01/19 1654 02/01/19 1957   02/01/19  1545  meropenem (MERREM) 1 g in sodium chloride 0.9 % 100 mL IVPB     1 g 200 mL/hr over 30 Minutes Intravenous STAT 02/01/19 1533 02/01/19 1724      Assessment/Plan: POD 11 elap, sbr- Gross -appears to be resolving ileus clinically -will give clears today,  will continue tpn until tolerating po -oob as much as possible, pulm toilet -bid dressing changes Atrial fibrillation -on Xarelto/flecainide/Cardizem DVT prophylaxis - onxarelto for afib Anemia-repeat hct yesterday 25, today pending, will follow this can continue xarelto I think still Mildly confused- seems to get worse at night. Not surprising given age/surgery ID: wbc up yesterday on cbc, will follow, afebrile, abdomen benign and ileus getting better, if persists without other source will consider ct scan  Rolm Bookbinder 02/12/2019

## 2019-02-12 NOTE — Progress Notes (Signed)
Physical Therapy Treatment Patient Details Name: Megan Hester MRN: [id number] DOB: 01/13/1931 Today's Date: 02/12/2019    History of Present Illness 86 y.o. female with medical history significant of sick sinus syndrome, afib on chronic xarelto, HTN, GERD who presented to the ED with 1 day of abd discomfort and nausea/vomiting.  Pt s/p dx lap with LOA and SBR for closed loop bowel obstruction with early perforation and ischemia with primary umbilical hernia repair by Dr. Johney Maine on 02/01/19    PT Comments    Pt eager to attempt ambulation and with marked improvement in activity tolerance.  Pt continues to need assist with all mobility tasks.   Follow Up Recommendations  Supervision/Assistance - 24 hour;SNF     Equipment Recommendations  Rolling walker with 5" wheels    Recommendations for Other Services       Precautions / Restrictions Precautions Precautions: Fall Precaution Comments: recent ABD surgery Restrictions Weight Bearing Restrictions: No    Mobility  Bed Mobility Overal bed mobility: Needs Assistance Bed Mobility: Supine to Sit Rolling: Min assist Sidelying to sit: Min assist          Transfers Overall transfer level: Needs assistance Equipment used: Rolling walker (2 wheeled) Transfers: Sit to/from [facility] Sit to Stand: Min assist Stand pivot transfers: Min assist       General transfer comment: assisted from EOB to ambulate and recliner<>BSC at end of session  Ambulation/Gait Ambulation/Gait assistance: Min guard;Min assist Gait Distance (Feet): 75 Feet(75' twice with seated rest break) Assistive device: Rolling walker (2 wheeled) Gait Pattern/deviations: Step-through pattern;Decreased stride length;Trunk flexed Gait velocity: decr   General Gait Details: cues for posture and position from [employer]             Wheelchair Mobility    Modified Rankin (Stroke Patients Only)       Balance Overall balance  assessment: Needs assistance Sitting-balance support: No upper extremity supported;Feet supported Sitting balance-Leahy Scale: Good     Standing balance support: Bilateral upper extremity supported Standing balance-Leahy Scale: Poor                              Cognition Arousal/Alertness: Awake/alert Behavior During Therapy: WFL for tasks assessed/performed Overall Cognitive Status: Impaired/Different from baseline Area of Impairment: Memory                                      Exercises      General Comments        Pertinent Vitals/Pain Pain Assessment: Faces Faces Pain Scale: Hurts a little bit Pain Location: incision/abdomen Pain Descriptors / Indicators: Sore;Guarding;Discomfort Pain Intervention(s): Limited activity within patient's tolerance;Monitored during session    Home Living                      Prior Function            PT Goals (current goals can now be found in the care plan section) Acute Rehab PT Goals Patient Stated Goal: to go home PT Goal Formulation: With patient Time For Goal Achievement: 02/16/19 Potential to Achieve Goals: Fair Progress towards PT goals: Progressing toward goals    Frequency    Min 3X/week      PT Plan Current plan remains appropriate    Co-evaluation  AM-PAC PT "6 Clicks" Mobility   Outcome Measure  Help needed turning from your back to your side while in a flat bed without using bedrails?: A Little Help needed moving from lying on your back to sitting on the side of a flat bed without using bedrails?: A Little Help needed moving to and from a bed to a chair (including a wheelchair)?: A Little Help needed standing up from a chair using your arms (e.g., wheelchair or bedside chair)?: A Little Help needed to walk in hospital room?: A Little Help needed climbing 3-5 steps with a railing? : A Lot 6 Click Score: 17    End of Session Equipment Utilized During  Treatment: Gait belt Activity Tolerance: Patient limited by fatigue Patient left: in chair;with call bell/phone within reach;with chair alarm set Nurse Communication: Mobility status PT Visit Diagnosis: Difficulty in walking, not elsewhere classified (R26.2)     Time: [id number] PT Time Calculation (min) (ACUTE ONLY): 28 min  Charges:  $Gait Training: 23-37 mins                     [place] Pager [phone] Office [phone]    Leaha Cuervo 02/12/2019, 3:56 PM

## 2019-02-12 NOTE — Progress Notes (Signed)
PROGRESS NOTE    Megan Hester  [id number] DOB: 1930/06/10 DOA: 02/01/2019 PCP: [name], MD    Brief Narrative:  86 y.o. F with A. fib, SSS with pacer, on Xarelto, HTN, history DCIS, and enucleation of the right eye who presented with 1 day abdominal pain nausea and vomiting.  In the ER, she was hypotensive, had AKI, and CT of the abdomen and pelvis showed closed-loop obstruction with possible early ischemia.  She underwent urgent surgical intervention with LOA, small bowel resection, and application of wound VAC.  Interim course: Postoperative day she has had some delirium. She is also developed A. fib with RVR, for which cardiology was consulted she was started on amiodarone drip, but this was stopped due to elevated LFTs.   Assessment & Plan:   Principal Problem:   Closed loop SBO (small bowel obstruction)  Active Problems:   A-fib (HCC)   Hypertension   Sick sinus syndrome (HCC)   Sepsis (HCC)   Chronic anticoagulation   Cardiac pacemaker in place - Med[employer]onic [id number] Advisa DR MRI   Rectocele   DM (diabetes mellitus) ([place])   History of adenomatous polyp of colon   Bowel obstruction (HCC)   Acute focal ischemia of small intestine (HCC)   Malnutrition of moderate degree  Small bowel obstruction S/p laparoscopic LOA, small bowel resection 12/22 by Dr. Johney Maine Postop day 10 -Recent concerns of post-op ileus with NG placed. Now improving -Planning for removing NG 1/1 per surgery -Wound VAC per general surgery -Started TNA per General Surgery, plan to continue until pt is reliably tolerating PO intake -Some brbpr noted recently while on xarelto, hgb trends have remained stable  Chronic atrial fibrillation with RVR Sick sinus syndrome with pacer Had been on flecainide, cannot restart for 2 weeks.  Has been on amiodarone, but LFTs were rising, so we would like to avoid this.   -Currently continued on Xarelto. -Cardiology following. Continued on IV  amiodarone.  --BP improved, thus scheduled beta blocker added -Thus far, HR seems improved  Delirium -Post-op delirium expected in 86 yo. -Seems stable this AM and improved   Hypertension -BP had been soft, now improved -Metoprolol added per Cardiology  AKI -Cr 1.9 at admission -Now resolved, recheck bmet in AM  Sepsis ruled out -Patient presented with SIRS physiology and elevated lactate. There was initial concern for sepsis, She was treated with 3 days of meropenem, but this resolved after small bowel resection, and she has had no further signs of infection, and likely her SIRS was from bowel ischemia. -remains stable at this time  Acute blood loss anemia  Macrocytic, B12, TSH and folate normal. -some BRBPR noted overnight -Hgb this AM 7.3 from 8.3 yesterday -Repeat CBC ordered for 3pm today. Will follow. If cont to trend down, would hold xarelto per above -Will repeat CBC in AM   Other medications -Continue eye drops -Anticipate resuming sertraline at time of discharge   DVT prophylaxis: Xarelto Code Status: Full Family Communication: Pt in room, family not at bedside Disposition Plan: Uncertain at this time  Consultants:   Cardiology  General Surgery  Procedures:   12/22 Lap LOA and small bowel resection  Antimicrobials: Anti-infectives (From admission, onward)   Start     Dose/Rate Route Frequency Ordered Stop   02/02/19 0600  meropenem (MERREM) 1 g in sodium chloride 0.9 % 100 mL IVPB     1 g 200 mL/hr over 30 Minutes Intravenous Every 12 hours 02/01/19 2243 02/04/19 0604  02/01/19 1730  clindamycin (CLEOCIN) IVPB 900 mg     900 mg 100 mL/hr over 30 Minutes Intravenous On call to O.R. 02/01/19 1654 02/01/19 2009   02/01/19 1730  gentamicin (GARAMYCIN) 310 mg in dextrose 5 % 100 mL IVPB     5 mg/kg  61.2 kg 107.8 mL/hr over 60 Minutes Intravenous On call to O.R. 02/01/19 1654 02/01/19 1957   02/01/19 1545  meropenem (MERREM) 1 g in sodium  chloride 0.9 % 100 mL IVPB     1 g 200 mL/hr over 30 Minutes Intravenous STAT 02/01/19 1533 02/01/19 1724      Subjective: Reports feeling better. Tolerating sips  Objective: Vitals:   02/12/19 0900 02/12/19 1000 02/12/19 1145 02/12/19 1214  BP: 128/78 119/76 137/87   Pulse: (!) 130 (!) 136 (!) 119   Resp: 20 (!) 29 (!) 27   Temp:    98.3 F (36.8 C)  TempSrc:    Oral  SpO2: 99% 96% 100%   Weight:      Height:        Intake/Output Summary (Last 24 hours) at 02/12/2019 1501 Last data filed at 02/12/2019 1200 Gross per 24 hour  Intake 2866.81 ml  Output 200 ml  Net 2666.81 ml   Filed Weights   02/09/19 0500 02/11/19 0436 02/12/19 0449  Weight: 65.5 kg 65.1 kg 65.1 kg    Examination: General exam: Awake, laying in bed, in nad Respiratory system: Normal respiratory effort, no wheezing Cardiovascular system: regular rate, s1, s2 Gastrointestinal system: Soft, nondistended, positive BS Central nervous system: CN2-12 grossly intact, strength intact Extremities: Perfused, no clubbing Skin: Normal skin turgor, no notable skin lesions seen Psychiatry: Mood normal // no visual hallucinations   Data Reviewed: I have personally reviewed following labs and imaging studies  CBC: Recent Labs  Lab 02/06/19 0214 02/09/19 0500 02/10/19 0805 02/11/19 0500 02/11/19 1530 02/12/19 1258  WBC 6.5 7.1 8.6 9.6 13.8* 12.6*  NEUTROABS 4.4  --  6.6  --   --   --   HGB 9.1* 8.1* 8.3* 7.3* 8.1* 7.6*  HCT 28.2* 25.5* 26.6* 22.8* 25.2* 23.8*  MCV 110.2* 113.3* 114.7* 114.0* 114.0* 113.9*  PLT 181 PLATELET CLUMPS NOTED ON SMEAR, UNABLE TO ESTIMATE 227 221 266 0000000   Basic Metabolic Panel: Recent Labs  Lab 02/06/19 0214 02/07/19 0404 02/08/19 0342 02/09/19 0500 02/10/19 0805 02/11/19 0500 02/12/19 0430  NA 139 140 141 141 145 142 138  K 3.0* 4.0 3.8 3.6 3.1* 3.6 3.6  CL 106 109 107 106 107 110 107  CO2 24 22 23 22 27 27 24   GLUCOSE 169* 103* 101* 98 159* 155* 166*  BUN 19 19 22   27* 26* 21 18  CREATININE 0.63 0.54 0.63 0.57 0.57 0.52 0.52  CALCIUM 8.2* 8.1* 8.4* 8.2* 8.1* 8.0* 7.9*  MG 1.8 1.9 2.0 2.0 2.1 1.8  --   PHOS 3.0  --   --   --  2.0* 2.2* 3.0   GFR: Estimated Creatinine Clearance: 43.6 mL/min (by C-G formula based on SCr of 0.52 mg/dL). Liver Function Tests: Recent Labs  Lab 02/07/19 0404 02/08/19 0342 02/09/19 0500 02/10/19 0805 02/11/19 0500  AST 24 27 22 19 22   ALT 19 24 20 21 22   ALKPHOS 87 81 71 71 70  BILITOT 2.0* 1.7* 1.7* 1.4* 1.0  PROT 4.8* 5.0* 4.9* 4.8* 4.5*  ALBUMIN 2.5* 2.6* 2.6* 2.6* 2.2*   No results for input(s): LIPASE, AMYLASE in the last 168 hours. No  results for input(s): AMMONIA in the last 168 hours. Coagulation Profile: No results for input(s): INR, PROTIME in the last 168 hours. Cardiac Enzymes: No results for input(s): CKTOTAL, CKMB, CKMBINDEX, TROPONINI in the last 168 hours. BNP (last 3 results) No results for input(s): PROBNP in the last 8760 hours. HbA1C: No results for input(s): HGBA1C in the last 72 hours. CBG: Recent Labs  Lab 02/11/19 1914 02/11/19 2324 02/12/19 0317 02/12/19 0719 02/12/19 1155  GLUCAP 100* 120* 136* 131* 129*   Lipid Profile: Recent Labs    02/10/19 0805  TRIG 99   Thyroid Function Tests: No results for input(s): TSH, T4TOTAL, FREET4, T3FREE, THYROIDAB in the last 72 hours. Anemia Panel: No results for input(s): VITAMINB12, FOLATE, FERRITIN, TIBC, IRON, RETICCTPCT in the last 72 hours. Sepsis Labs: No results for input(s): PROCALCITON, LATICACIDVEN in the last 168 hours.  No results found for this or any previous visit (from the past 240 hour(s)).   Radiology Studies: No results found.  Scheduled Meds: . bisacodyl  10 mg Rectal Daily  . Chlorhexidine Gluconate Cloth  6 each Topical Daily  . insulin aspart  0-9 Units Subcutaneous Q4H  . lip balm  1 application Topical BID  . mouth rinse  15 mL Mouth Rinse BID  . metoprolol tartrate  25 mg Oral BID  .  neomycin-polymyxin b-dexamethasone  1 application Right Eye QHS  . polyvinyl alcohol   Right Eye BID  . rivaroxaban  20 mg Per Tube Q supper  . sodium chloride flush  10-40 mL Intracatheter Q12H   Continuous Infusions: . amiodarone 30 mg/hr (02/12/19 1058)  . lactated ringers Stopped (02/12/19 0406)  . methocarbamol (ROBAXIN) IV    . ondansetron (ZOFRAN) IV    . TPN ADULT (ION) 65 mL/hr at 02/12/19 1022  . TPN ADULT (ION)       LOS: 11 days   Marylu Lund, MD [facility] Pager On Amion  If 7PM-7AM, please contact night-coverage 02/12/2019, 3:01 PM

## 2019-02-12 NOTE — Consult Note (Signed)
Progress Note  Patient Name: Megan Hester Date of Encounter: 02/12/2019  Primary Cardiologist: Donato Heinz, MD   Subjective   Feeling well.  She denies pain and breathing is unlabored.  She has occasional palpitations.    Inpatient Medications    Scheduled Meds: . bisacodyl  10 mg Rectal Daily  . Chlorhexidine Gluconate Cloth  6 each Topical Daily  . insulin aspart  0-9 Units Subcutaneous Q4H  . lip balm  1 application Topical BID  . mouth rinse  15 mL Mouth Rinse BID  . neomycin-polymyxin b-dexamethasone  1 application Right Eye QHS  . polyvinyl alcohol   Right Eye BID  . rivaroxaban  20 mg Per Tube Q supper  . sodium chloride flush  10-40 mL Intracatheter Q12H   Continuous Infusions: . amiodarone 30 mg/hr (02/12/19 0600)  . famotidine (PEPCID) IV Stopped (02/11/19 2138)  . lactated ringers Stopped (02/12/19 0406)  . methocarbamol (ROBAXIN) IV    . ondansetron (ZOFRAN) IV    . TPN ADULT (ION) 65 mL/hr at 02/12/19 0600  . TPN ADULT (ION)     PRN Meds: alum & mag hydroxide-simeth, clobetasol cream, diphenhydrAMINE, enalaprilat, haloperidol lactate, magic mouthwash, methocarbamol (ROBAXIN) IV, metoprolol tartrate, morphine injection, naphazoline-glycerin, OLANZapine, ondansetron (ZOFRAN) IV **OR** ondansetron (ZOFRAN) IV, phenol, prochlorperazine, simethicone, sodium chloride flush, sodium chloride flush   Vital Signs    Vitals:   02/12/19 0400 02/12/19 0449 02/12/19 0700 02/12/19 0742  BP: 121/79  107/77   Pulse: (!) 124  (!) 125   Resp: (!) 25  (!) 24   Temp:    97.6 F (36.4 C)  TempSrc:    Oral  SpO2: 99%  97%   Weight:  65.1 kg    Height:        Intake/Output Summary (Last 24 hours) at 02/12/2019 0824 Last data filed at 02/12/2019 0600 Gross per 24 hour  Intake 2443.58 ml  Output 200 ml  Net 2243.58 ml   Last 3 Weights 02/12/2019 02/11/2019 02/10/2019  Weight (lbs) 143 lb 8.3 oz 143 lb 8.3 oz (No Data)  Weight (kg) 65.1 kg 65.1 kg (No Data)       Telemetry    Atrial fibrillation.  Rate 110s-120s - Personally Reviewed  ECG    n/a - Personally Reviewed  Physical Exam   VS:  BP 107/77   Pulse (!) 125   Temp 97.6 F (36.4 C) (Oral)   Resp (!) 24   Ht 5\' 4"  (1.626 m)   Wt 65.1 kg   SpO2 97%   BMI 24.64 kg/m  , BMI Body mass index is 24.64 kg/m. GENERAL:  Well appearing.  No acute distress HEENT: R eye enucleated. Oral mucosa unremarkable NECK:  No jugular venous distention, waveform within normal limits, carotid upstroke brisk and symmetric, no bruits LUNGS: R base crackles HEART:  Tachycardic.  Irregularly irregular  PMI not displaced or sustained,S1 and S2 within normal limits, no S3, no S4, no clicks, no rubs, no murmurs ABD:  Flat, positive bowel sounds normal in frequency in pitch, no bruits, no rebound, no guarding, no midline pulsatile mass, no hepatomegaly, no splenomegaly EXT:  2 plus pulses throughout, no edema, no cyanosis no clubbing SKIN:  No rashes no nodules NEURO:  Cranial nerves II through XII grossly intact, motor grossly intact throughout PSYCH:  Cognitively intact, oriented to person place and time   Labs    High Sensitivity Troponin:  No results for input(s): TROPONINIHS in the last 720  hours.    Chemistry Recent Labs  Lab 02/09/19 0500 02/10/19 0805 02/11/19 0500 02/12/19 0430  NA 141 145 142 138  K 3.6 3.1* 3.6 3.6  CL 106 107 110 107  CO2 22 27 27 24   GLUCOSE 98 159* 155* 166*  BUN 27* 26* 21 18  CREATININE 0.57 0.57 0.52 0.52  CALCIUM 8.2* 8.1* 8.0* 7.9*  PROT 4.9* 4.8* 4.5*  --   ALBUMIN 2.6* 2.6* 2.2*  --   AST 22 19 22   --   ALT 20 21 22   --   ALKPHOS 71 71 70  --   BILITOT 1.7* 1.4* 1.0  --   GFRNONAA >60 >60 >60 >60  GFRAA >60 >60 >60 >60  ANIONGAP 13 11 5 7      Hematology Recent Labs  Lab 02/10/19 0805 02/11/19 0500 02/11/19 1530  WBC 8.6 9.6 13.8*  RBC 2.32* 2.00* 2.21*  HGB 8.3* 7.3* 8.1*  HCT 26.6* 22.8* 25.2*  MCV 114.7* 114.0* 114.0*  MCH 35.8*  36.5* 36.7*  MCHC 31.2 32.0 32.1  RDW 15.0 15.2 15.6*  PLT 227 221 266    BNPNo results for input(s): BNP, PROBNP in the last 168 hours.   DDimer No results for input(s): DDIMER in the last 168 hours.   Radiology    No results found.  Cardiac Studies   Echo 02/08/19: IMPRESSIONS   1. Left ventricular ejection fraction, by visual estimation, is 50 to 55%. The left ventricle has low normal function. Left ventricular septal wall thickness was normal. Normal left ventricular posterior wall thickness. There is no left ventricular  hypertrophy.  2. Left ventricular diastolic parameters are indeterminate.  3. The left ventricle has no regional wall motion abnormalities.  4. Global right ventricle has normal systolic function.The right ventricular size is normal. No increase in right ventricular wall thickness.  5. Left atrial size was normal.  6. Right atrial size was normal.  7. The mitral valve is normal in structure. Trivial mitral valve regurgitation. No evidence of mitral stenosis.  8. The tricuspid valve is normal in structure.  9. The aortic valve is tricuspid. Aortic valve regurgitation is not visualized. No evidence of aortic valve sclerosis or stenosis. 10. The pulmonic valve was normal in structure. Pulmonic valve regurgitation is not visualized. 11. Mildly elevated pulmonary artery systolic pressure. 12. A pacer wire is visualized. 13. The inferior vena cava is normal in size with greater than 50% respiratory variability, suggesting right atrial pressure of 3 mmHg.  Patient Profile     86 y.o. female with persistent atrial fibrillation, SSS s/p PPM, and hypertension admited with bowel obstruction now s/p urgent small bowel resection and wound vac.  Assessment & Plan    # PAF:  Ms. Laviolette remains in atrial fibrillation with poorly controlled rates.  BP has been higher.  Most recent readings in the room are [id number] systolic. We will add metoprolol 25mg  bid. She  remains on IV amiodarone.  She continues to receive Xarelto despite BRBPR.  H/H stable.  OK to hold Xarelto if necessary from a cardiac standpoint.  # SSS: S/p PPM.  # Hypertension: Her home diltiazem was on hold due to hypotension.  BP remains soft at times.  Starting metoprolol as above.  # Leukocytosis: Noted yesterday.  She seems asymptomatic.  Will add CBC to today's labs.       For questions or updates, please contact [place] Please consult [email or web address] for contact info under  Signed, Skeet Latch, MD  02/12/2019, 8:24 AM

## 2019-02-12 NOTE — Progress Notes (Signed)
PHARMACY - TOTAL PARENTERAL NUTRITION CONSULT NOTE   Indication: Post-operative ileus  Patient Measurements: Height: 5\' 4"  (162.6 cm) Weight: 143 lb 8.3 oz (65.1 kg) IBW/kg (Calculated) : 54.7 TPN AdjBW (KG): 61.2 Body mass index is 24.64 kg/m.  Recent Labs    02/10/19 0805 02/11/19 0500 02/12/19 0430  NA 145 142 138  K 3.1* 3.6 3.6  CL 107 110 107  CO2 27 27 24   GLUCOSE 159* 155* 166*  BUN 26* 21 18  CREATININE 0.57 0.52 0.52  CALCIUM 8.1* 8.0* 7.9*  PHOS 2.0* 2.2* 3.0  MG 2.1 1.8  --   ALBUMIN 2.6* 2.2*  --   ALKPHOS 71 70  --   AST 19 22  --   ALT 21 22  --   BILITOT 1.4* 1.0  --   TRIG 99  --   --   PREALBUMIN 8.8*  --   --      Intake/Output Summary (Last 24 hours) at 02/12/2019 0711 Last data filed at 02/12/2019 0600 Gross per 24 hour  Intake 4171.05 ml  Output 200 ml  Net 3971.05 ml    Assessment:   Glucose / Insulin: CBGs < 180. 3 units of SSI used in past 24 hrs Electrolytes: stable except for Phos improved to 3 Renal: SCr WNL, BUN improved LFTs / TGs: all WNL Prealbumin / albumin: Prealbumin 8.8 today; albumin low Intake / Output; MIVF: I/O (+) 1.1 L; mIVF LR at 75 mL/hr. Now having BMs. GI Imaging: Abd XR 12/29: "persistent SBO gas pattern" Surgeries / Procedures:  12/22: LAPAROSCOPY DIAGNOSTIC WITH LYSIS OF ADHESION SMALL BOWEL RESECTION TAP BLOCK PRIMARY UMBILICAL HERNIA REPAIR APPLICATION OF WOUND VAC  Central access: PICC line placement ordered by MD for 12/30 TPN start date:  12/30 after PICC placed  Nutritional Goals per RD recommendation 12/30: 90-105 grams protein/d 1845-2030 KCal/d  Goal TPN rate (using formula of amino acids 52 g/L, dextrose 15%, and lipids 30 g/L)  is 80 mL/hr (provides 100 g of protein and 1,955 kcals per day)  Current Nutrition:  TPN at 55 mL/hr NPO  Plan:  At 1800: Increase TPN to goal at 80 mL/hr Increase Na to 50 mEq/L, continue K at 55 mEq/L, phos at 20 mMol/L, 65mEq/L of Ca, 41mEq/L of Mg, Cl:Ac ratio  1:1 Add standard MVI to TPN MWF and trace elements to TPN daily Stop IV famotidine and add to TPN Reduce mIVF to 10 ml/hr tonight Continue SSI Sensitive q4h   Monitor TPN labs on Mon/Thurs  Elenor Quinones, PharmD, BCPS, BCIDP Clinical Pharmacist 02/12/2019 7:16 AM

## 2019-02-13 DIAGNOSIS — I10 Essential (primary) hypertension: Secondary | ICD-10-CM

## 2019-02-13 DIAGNOSIS — D62 Acute posthemorrhagic anemia: Secondary | ICD-10-CM

## 2019-02-13 DIAGNOSIS — R6510 Systemic inflammatory response syndrome (SIRS) of non-infectious origin without acute organ dysfunction: Secondary | ICD-10-CM

## 2019-02-13 LAB — GLUCOSE, CAPILLARY
Glucose-Capillary: 99 mg/dL (ref 70–99)
Glucose-Capillary: 96 mg/dL (ref 70–99)
Glucose-Capillary: 120 mg/dL — ABNORMAL HIGH (ref 70–99)

## 2019-02-13 LAB — CBC
HCT: 27.8 % — ABNORMAL LOW (ref 36.0–46.0)
Hemoglobin: 9 g/dL — ABNORMAL LOW (ref 12.0–15.0)
MCH: 36.4 pg — ABNORMAL HIGH (ref 26.0–34.0)
MCHC: 32.4 g/dL (ref 30.0–36.0)
MCV: 112.6 fL — ABNORMAL HIGH (ref 80.0–100.0)
Platelets: 246 10*3/uL (ref 150–400)
RBC: 2.47 MIL/uL — ABNORMAL LOW (ref 3.87–5.11)
RDW: 15.7 % — ABNORMAL HIGH (ref 11.5–15.5)
WBC: 11.6 10*3/uL — ABNORMAL HIGH (ref 4.0–10.5)
nRBC: 0.2 % (ref 0.0–0.2)

## 2019-02-13 MED ORDER — METOPROLOL TARTRATE 25 MG PO TABS
50.0000 mg | ORAL_TABLET | Freq: Two times a day (BID) | ORAL | Status: DC
  Administered 2019-02-13 – 2019-02-14 (×2): 50 mg via ORAL
  Filled 2019-02-13 (×3): qty 2

## 2019-02-13 MED ORDER — FAMOTIDINE 20 MG PO TABS
20.0000 mg | ORAL_TABLET | Freq: Every day | ORAL | Status: DC
  Administered 2019-02-13 – 2019-02-16 (×4): 20 mg via ORAL
  Filled 2019-02-13 (×4): qty 1

## 2019-02-13 MED ORDER — ENSURE ENLIVE PO LIQD
237.0000 mL | Freq: Two times a day (BID) | ORAL | Status: DC
  Administered 2019-02-13 – 2019-02-15 (×3): 237 mL via ORAL

## 2019-02-13 MED ORDER — INSULIN ASPART 100 UNIT/ML ~~LOC~~ SOLN: 0.0000 [IU] | Freq: Three times a day (TID) | SUBCUTANEOUS | Status: DC

## 2019-02-13 MED ORDER — AMIODARONE HCL 200 MG PO TABS
200.0000 mg | ORAL_TABLET | Freq: Two times a day (BID) | ORAL | Status: DC
  Administered 2019-02-13 – 2019-02-14 (×3): 200 mg via ORAL
  Filled 2019-02-13 (×3): qty 1

## 2019-02-13 NOTE — Progress Notes (Signed)
PROGRESS NOTE    Megan Hester  [id number] DOB: [birth year] DOA: 02/01/2019 PCP: [name], MD    Brief Narrative:  86 y.o. F with A. fib, SSS with pacer, on Xarelto, HTN, history DCIS, and enucleation of the right eye who presented with 1 day abdominal pain nausea and vomiting.  In the ER, she was hypotensive, had AKI, and CT of the abdomen and pelvis showed closed-loop obstruction with possible early ischemia.  She underwent urgent surgical intervention with LOA, small bowel resection, and application of wound VAC.  Interim course: Postoperative day she has had some delirium. She is also developed A. fib with RVR, for which cardiology was consulted she was started on amiodarone drip, but this was stopped due to elevated LFTs.   Assessment & Plan:   Principal Problem:   Closed loop SBO (small bowel obstruction)  Active Problems:   A-fib (HCC)   Hypertension   Sick sinus syndrome (HCC)   Sepsis (HCC)   Chronic anticoagulation   Cardiac pacemaker in place - [employer] [id number] Advisa DR MRI   Rectocele   DM (diabetes mellitus) ([place])   History of adenomatous polyp of colon   Bowel obstruction (HCC)   Acute focal ischemia of small intestine (HCC)   Malnutrition of moderate degree   SIRS (systemic inflammatory response syndrome) (HCC)   Acute blood loss anemia  Small bowel obstruction S/p laparoscopic LOA, small bowel resection 12/22 by Dr. Johney Maine Postop day 12 -Recent concerns of post-op ileus with NG placed. Now improving -Planning for removing NG 1/1 per surgery -Wound VAC per general surgery -Started TNA per General Surgery, pt lost IV access, thus TNA stopped -Advanced diet to fulls per surgery  Chronic atrial fibrillation with RVR Sick sinus syndrome with pacer -Had been on flecainide, cannot restart for 2 weeks.  Has been on amiodarone, but LFTs were rising, so we would like to avoid this.   -Currently continued on Xarelto. -Cardiology following.  Transitioned to PO amiodarone --Pt tolerating beta blocker, dose increased to 50mg  bid per Cardiology  Delirium -Post-op delirium expected in 86 yo. -Seems stable this AM and overall improved   Hypertension -BP had been soft, now improved -Metoprolol increased to 50mg  bid  AKI -Cr 1.9 at admission -Normalized with IVF  Sepsis ruled out -Patient presented with SIRS physiology and elevated lactate. There was initial concern for sepsis, She was treated with 3 days of meropenem, but this resolved after small bowel resection, and she has had no further signs of infection, and likely her SIRS was from bowel ischemia. -remains stable at this time  Acute blood loss anemia  Macrocytic, B12, TSH and folate normal. -some BRBPR noted recently -Serial Hgb  unremarkable   Other medications -Continue eye drops -Anticipate resuming sertraline at time of discharge   DVT prophylaxis: Xarelto Code Status: Full Family Communication: Pt in room, family not at bedside Disposition Plan: Uncertain at this time  Consultants:   Cardiology  General Surgery  Procedures:   12/22 Lap LOA and small bowel resection  Antimicrobials: Anti-infectives (From admission, onward)   Start     Dose/Rate Route Frequency Ordered Stop   02/02/19 0600  meropenem (MERREM) 1 g in sodium chloride 0.9 % 100 mL IVPB     1 g 200 mL/hr over 30 Minutes Intravenous Every 12 hours 02/01/19 2243 02/04/19 0604   02/01/19 1730  clindamycin (CLEOCIN) IVPB 900 mg     900 mg 100 mL/hr over 30 Minutes Intravenous On call to O.R.  02/01/19 1654 02/01/19 2009   02/01/19 1730  gentamicin (GARAMYCIN) 310 mg in dextrose 5 % 100 mL IVPB     5 mg/kg  61.2 kg 107.8 mL/hr over 60 Minutes Intravenous On call to O.R. 02/01/19 1654 02/01/19 1957   02/01/19 1545  meropenem (MERREM) 1 g in sodium chloride 0.9 % 100 mL IVPB     1 g 200 mL/hr over 30 Minutes Intravenous STAT 02/01/19 1533 02/01/19 1724      Subjective: States  feeling better today  Objective: Vitals:   02/13/19 0809 02/13/19 0900 02/13/19 1000 02/13/19 1157  BP:      Pulse:  (!) 110 (!) 112   Resp:  20 (!) 26   Temp: (!) 97.4 F (36.3 C)   (!) 97.5 F (36.4 C)  TempSrc: Oral   Oral  SpO2:  99% 100%   Weight:      Height:        Intake/Output Summary (Last 24 hours) at 02/13/2019 1432 Last data filed at 02/13/2019 1000 Gross per 24 hour  Intake 2375.27 ml  Output 700 ml  Net 1675.27 ml   Filed Weights   02/11/19 0436 02/12/19 0449 02/13/19 0319  Weight: 65.1 kg 65.1 kg 65.1 kg    Examination: General exam: Awake, laying in bed, in nad Respiratory system: Normal respiratory effort, no wheezing Cardiovascular system: regular rate, s1, s2 Gastrointestinal system: Soft, nondistended, positive BS Central nervous system: CN2-12 grossly intact, strength intact Extremities: Perfused, no clubbing Skin: Normal skin turgor, no notable skin lesions seen Psychiatry: Mood normal // no visual hallucinations   Data Reviewed: I have personally reviewed following labs and imaging studies  CBC: Recent Labs  Lab 02/10/19 0805 02/11/19 0500 02/11/19 1530 02/12/19 1258 02/13/19 0841  WBC 8.6 9.6 13.8* 12.6* 11.6*  NEUTROABS 6.6  --   --   --   --   HGB 8.3* 7.3* 8.1* 7.6* 9.0*  HCT 26.6* 22.8* 25.2* 23.8* 27.8*  MCV 114.7* 114.0* 114.0* 113.9* 112.6*  PLT 227 221 266 243 0000000   Basic Metabolic Panel: Recent Labs  Lab 02/07/19 0404 02/08/19 0342 02/09/19 0500 02/10/19 0805 02/11/19 0500 02/12/19 0430  NA 140 141 141 145 142 138  K 4.0 3.8 3.6 3.1* 3.6 3.6  CL 109 107 106 107 110 107  CO2 22 23 22 27 27 24   GLUCOSE 103* 101* 98 159* 155* 166*  BUN 19 22 27* 26* 21 18  CREATININE 0.54 0.63 0.57 0.57 0.52 0.52  CALCIUM 8.1* 8.4* 8.2* 8.1* 8.0* 7.9*  MG 1.9 2.0 2.0 2.1 1.8  --   PHOS  --   --   --  2.0* 2.2* 3.0   GFR: Estimated Creatinine Clearance: 43.6 mL/min (by C-G formula based on SCr of 0.52 mg/dL). Liver Function  Tests: Recent Labs  Lab 02/07/19 0404 02/08/19 0342 02/09/19 0500 02/10/19 0805 02/11/19 0500  AST 24 27 22 19 22   ALT 19 24 20 21 22   ALKPHOS 87 81 71 71 70  BILITOT 2.0* 1.7* 1.7* 1.4* 1.0  PROT 4.8* 5.0* 4.9* 4.8* 4.5*  ALBUMIN 2.5* 2.6* 2.6* 2.6* 2.2*   No results for input(s): LIPASE, AMYLASE in the last 168 hours. No results for input(s): AMMONIA in the last 168 hours. Coagulation Profile: No results for input(s): INR, PROTIME in the last 168 hours. Cardiac Enzymes: No results for input(s): CKTOTAL, CKMB, CKMBINDEX, TROPONINI in the last 168 hours. BNP (last 3 results) No results for input(s): PROBNP in the last  8760 hours. HbA1C: No results for input(s): HGBA1C in the last 72 hours. CBG: Recent Labs  Lab 02/12/19 1932 02/12/19 2310 02/13/19 0331 02/13/19 0729 02/13/19 1127  GLUCAP 128* 116* 99 96 120*   Lipid Profile: No results for input(s): CHOL, HDL, LDLCALC, TRIG, CHOLHDL, LDLDIRECT in the last 72 hours. Thyroid Function Tests: No results for input(s): TSH, T4TOTAL, FREET4, T3FREE, THYROIDAB in the last 72 hours. Anemia Panel: No results for input(s): VITAMINB12, FOLATE, FERRITIN, TIBC, IRON, RETICCTPCT in the last 72 hours. Sepsis Labs: No results for input(s): PROCALCITON, LATICACIDVEN in the last 168 hours.  No results found for this or any previous visit (from the past 240 hour(s)).   Radiology Studies: No results found.  Scheduled Meds: . amiodarone  200 mg Oral BID  . bisacodyl  10 mg Rectal Daily  . Chlorhexidine Gluconate Cloth  6 each Topical Daily  . famotidine  20 mg Oral Daily  . feeding supplement (ENSURE ENLIVE)  237 mL Oral BID BM  . insulin aspart  0-9 Units Subcutaneous TID WC  . lip balm  1 application Topical BID  . mouth rinse  15 mL Mouth Rinse BID  . Melatonin  5 mg Oral QHS  . metoprolol tartrate  50 mg Oral BID  . neomycin-polymyxin b-dexamethasone  1 application Right Eye QHS  . polyvinyl alcohol   Right Eye BID  .  rivaroxaban  20 mg Per Tube Q supper  . sodium chloride flush  10-40 mL Intracatheter Q12H   Continuous Infusions: . methocarbamol (ROBAXIN) IV    . ondansetron (ZOFRAN) IV    . TPN ADULT (ION) Stopped (02/12/19 2308)     LOS: 12 days   Marylu Lund, MD [facility] Pager On Amion  If 7PM-7AM, please contact night-coverage 02/13/2019, 2:32 PM

## 2019-02-13 NOTE — Progress Notes (Signed)
Progress Note  Patient Name: Megan Hester Date of Encounter: 02/13/2019  Primary Cardiologist: Donato Heinz, MD   Subjective   Feeling well.  No chest pain or palpitations  Inpatient Medications    Scheduled Meds: . bisacodyl  10 mg Rectal Daily  . Chlorhexidine Gluconate Cloth  6 each Topical Daily  . feeding supplement (ENSURE ENLIVE)  237 mL Oral BID BM  . insulin aspart  0-9 Units Subcutaneous Q4H  . lip balm  1 application Topical BID  . mouth rinse  15 mL Mouth Rinse BID  . Melatonin  5 mg Oral QHS  . metoprolol tartrate  25 mg Oral BID  . neomycin-polymyxin b-dexamethasone  1 application Right Eye QHS  . polyvinyl alcohol   Right Eye BID  . rivaroxaban  20 mg Per Tube Q supper  . sodium chloride flush  10-40 mL Intracatheter Q12H   Continuous Infusions: . amiodarone 30 mg/hr (02/13/19 0600)  . methocarbamol (ROBAXIN) IV    . ondansetron (ZOFRAN) IV    . TPN ADULT (ION) Stopped (02/12/19 2308)   PRN Meds: alum & mag hydroxide-simeth, clobetasol cream, diphenhydrAMINE, enalaprilat, haloperidol lactate, magic mouthwash, methocarbamol (ROBAXIN) IV, metoprolol tartrate, morphine injection, naphazoline-glycerin, OLANZapine, ondansetron (ZOFRAN) IV **OR** ondansetron (ZOFRAN) IV, phenol, prochlorperazine, simethicone, sodium chloride flush, sodium chloride flush   Vital Signs    Vitals:   02/12/19 2358 02/13/19 0319 02/13/19 0400 02/13/19 0809  BP:   125/81   Pulse:   95   Resp:   18   Temp: (!) 97.5 F (36.4 C) (!) 97.4 F (36.3 C)  (!) 97.4 F (36.3 C)  TempSrc: Oral Oral  Oral  SpO2:   99%   Weight:  65.1 kg    Height:        Intake/Output Summary (Last 24 hours) at 02/13/2019 0843 Last data filed at 02/13/2019 0600 Gross per 24 hour  Intake 2802.81 ml  Output 500 ml  Net 2302.81 ml   Last 3 Weights 02/13/2019 02/12/2019 02/11/2019  Weight (lbs) 143 lb 8.3 oz 143 lb 8.3 oz 143 lb 8.3 oz  Weight (kg) 65.1 kg 65.1 kg 65.1 kg      Telemetry     Atrial fibrillation. PVCs.  Rate 80s-100s.  - Personally Reviewed  ECG    n/a - Personally Reviewed  Physical Exam   VS:  BP 125/81 (BP Location: Left Arm)   Pulse 95   Temp (!) 97.4 F (36.3 C) (Oral)   Resp 18   Ht 5\' 4"  (1.626 m)   Wt 65.1 kg   SpO2 99%   BMI 24.64 kg/m  , BMI Body mass index is 24.64 kg/m. GENERAL:  Well appearing.  No acute distress.  Frail, elderly woman HEENT: Pupils equal round and reactive, fundi not visualized, oral mucosa unremarkable NECK:  No jugular venous distention, waveform within normal limits, carotid upstroke brisk and symmetric, no bruits LUNGS:  Clear to auscultation bilaterally HEART:  Tachycardic.  Irregularly irregular PMI not displaced or sustained,S1 and S2 within normal limits, no S3, no S4, no clicks, no rubs, no murmurs ABD:  Flat, positive bowel sounds normal in frequency in pitch, no bruits, no rebound, no guarding, no midline pulsatile mass, no hepatomegaly, no splenomegaly EXT:  2 plus pulses throughout, no edema, no cyanosis no clubbing SKIN:  No rashes no nodules NEURO:  Cranial nerves II through XII grossly intact, motor grossly intact throughout PSYCH:  Cognitively intact, oriented to person place and time  Labs    High Sensitivity Troponin:  No results for input(s): TROPONINIHS in the last 720 hours.    Chemistry Recent Labs  Lab 02/09/19 0500 02/10/19 0805 02/11/19 0500 02/12/19 0430  NA 141 145 142 138  K 3.6 3.1* 3.6 3.6  CL 106 107 110 107  CO2 22 27 27 24   GLUCOSE 98 159* 155* 166*  BUN 27* 26* 21 18  CREATININE 0.57 0.57 0.52 0.52  CALCIUM 8.2* 8.1* 8.0* 7.9*  PROT 4.9* 4.8* 4.5*  --   ALBUMIN 2.6* 2.6* 2.2*  --   AST 22 19 22   --   ALT 20 21 22   --   ALKPHOS 71 71 70  --   BILITOT 1.7* 1.4* 1.0  --   GFRNONAA >60 >60 >60 >60  GFRAA >60 >60 >60 >60  ANIONGAP 13 11 5 7      Hematology Recent Labs  Lab 02/11/19 0500 02/11/19 1530 02/12/19 1258  WBC 9.6 13.8* 12.6*  RBC 2.00* 2.21* 2.09*   HGB 7.3* 8.1* 7.6*  HCT 22.8* 25.2* 23.8*  MCV 114.0* 114.0* 113.9*  MCH 36.5* 36.7* 36.4*  MCHC 32.0 32.1 31.9  RDW 15.2 15.6* 15.1  PLT 221 266 243    BNPNo results for input(s): BNP, PROBNP in the last 168 hours.   DDimer No results for input(s): DDIMER in the last 168 hours.   Radiology    No results found.  Cardiac Studies   Echo 02/08/19: IMPRESSIONS  1. Left ventricular ejection fraction, by visual estimation, is 50 to 55%. The left ventricle has low normal function. Left ventricular septal wall thickness was normal. Normal left ventricular posterior wall thickness. There is no left ventricular  hypertrophy. 2. Left ventricular diastolic parameters are indeterminate. 3. The left ventricle has no regional wall motion abnormalities. 4. Global right ventricle has normal systolic function.The right ventricular size is normal. No increase in right ventricular wall thickness. 5. Left atrial size was normal. 6. Right atrial size was normal. 7. The mitral valve is normal in structure. Trivial mitral valve regurgitation. No evidence of mitral stenosis. 8. The tricuspid valve is normal in structure. 9. The aortic valve is tricuspid. Aortic valve regurgitation is not visualized. No evidence of aortic valve sclerosis or stenosis. 10. The pulmonic valve was normal in structure. Pulmonic valve regurgitation is not visualized. 11. Mildly elevated pulmonary artery systolic pressure. 12. A pacer wire is visualized. 13. The inferior vena cava is normal in size with greater than 50% respiratory variability, suggesting right atrial pressure of 3 mmHg.  Patient Profile     86 y.o. female with persistent atrial fibrillation, SSS s/p PPM, and hypertension admited with bowel obstruction now s/p urgent small bowel resection and wound vac.  Assessment & Plan    # PAF:  Megan Hester remains in atrial fibrillation.  Rate are a little better since starting metoprolol.  Will increase  to 50mg  bid.  Switch amiodarone to 200mg  po bid. She remains on Xarelto.  Hemoglobin up to 9 this am.  She had BRBPR earlier this hospitalization.  # SSS: S/p PPM.  # Hypertension: Her home diltiazem was on hold due to hypotension.  Increase metoprolol as above.   # Leukocytosis: Improving.  No clear source.  # Delirium: Megan Hester continues to have confusion overnight.  She pulled out her PICC line. ?sundowning.  # Acute blood loss anemia: Stable      For questions or updates, please contact [place] Please consult [email or web address] for contact info  under        Signed, Skeet Latch, MD  02/13/2019, 8:43 AM

## 2019-02-13 NOTE — Progress Notes (Signed)
PHARMACY - TOTAL PARENTERAL NUTRITION CONSULT NOTE   Indication: Post-operative ileus (resolved)  Patient Measurements: Height: 5\' 4"  (162.6 cm) Weight: 143 lb 8.3 oz (65.1 kg) IBW/kg (Calculated) : 54.7 TPN AdjBW (KG): 61.2 Body mass index is 24.64 kg/m.  Recent Labs    02/10/19 0805 02/11/19 0500 02/12/19 0430  NA 145 142 138  K 3.1* 3.6 3.6  CL 107 110 107  CO2 27 27 24   GLUCOSE 159* 155* 166*  BUN 26* 21 18  CREATININE 0.57 0.52 0.52  CALCIUM 8.1* 8.0* 7.9*  PHOS 2.0* 2.2* 3.0  MG 2.1 1.8  --   ALBUMIN 2.6* 2.2*  --   ALKPHOS 71 70  --   AST 19 22  --   ALT 21 22  --   BILITOT 1.4* 1.0  --   TRIG 99  --   --   PREALBUMIN 8.8*  --   --      Intake/Output Summary (Last 24 hours) at 02/13/2019 0710 Last data filed at 02/13/2019 0600 Gross per 24 hour  Intake 2712.12 ml  Output 500 ml  Net 2212.12 ml    Assessment:   Glucose / Insulin: CBGs < 180. 3 units of SSI used in past 24 hrs Electrolytes: stable except for Phos improved to 3 Renal: SCr WNL, BUN improved LFTs / TGs: all WNL Prealbumin / albumin: Prealbumin 8.8 today; albumin low Intake / Output; MIVF: I/O (+) 1.1 L; mIVF LR at 75 mL/hr. Now having BMs. GI Imaging: Abd XR 12/29: "persistent SBO gas pattern" Surgeries / Procedures:  12/22: LAPAROSCOPY DIAGNOSTIC WITH LYSIS OF ADHESION SMALL BOWEL RESECTION TAP BLOCK PRIMARY UMBILICAL HERNIA REPAIR APPLICATION OF WOUND VAC  Central access: 12/30 >> 1/2 (pulled PICC out) TPN start date:  12/30 >> 1/2  Nutritional Goals per RD recommendation 12/30: 90-105 grams protein/d 1845-2030 KCal/d  Goal TPN rate (using formula of amino acids 52 g/L, dextrose 15%, and lipids 30 g/L)  is 80 mL/hr (provides 100 g of protein and 1,955 kcals per day)  Current Nutrition:  TPN at 55 mL/hr NPO  Plan:  Hold off on replacing PICC Trial of full liquid diet and supplements per surgery Change SSI and CBG checks to TID with meals Restart Pepcid 20mg  daily Will  continue to follow for need to restart TPN tomorrow vs d/c consult and labs  Elenor Quinones, PharmD, BCPS, BCIDP Clinical Pharmacist 02/13/2019 7:10 AM

## 2019-02-13 NOTE — Progress Notes (Signed)
12 Days Post-Op   Subjective/Chief Complaint: Appropriate this am, remembers her surgeon and my name, confused overnight pulled picc out, having flatus and stool, no more blood   Objective: Vital signs in last 24 hours: Temp:  [97.4 F (36.3 C)-98.3 F (36.8 C)] 97.4 F (36.3 C) (01/03 0809) Pulse Rate:  [95-136] 95 (01/03 0400) Resp:  [18-29] 18 (01/03 0400) BP: (112-137)/(76-102) 125/81 (01/03 0400) SpO2:  [96 %-100 %] 99 % (01/03 0400) Weight:  [65.1 kg] 65.1 kg (01/03 0319) Last BM Date: 02/12/19  Intake/Output from previous day: 01/02 0701 - 01/03 0700 In: 2712.1 [P.O.:460; I.V.:2206.3; IV Piggyback:45.8] Out: 500 [Urine:500] Intake/Output this shift: No intake/output data recorded.  Resp:clear to auscultation bilaterally Cardio:irregularly irregular rhythm [id number] clean with granulation tissue, bs present, soft, approp tender  Lab Results:  Recent Labs    02/11/19 1530 02/12/19 1258  WBC 13.8* 12.6*  HGB 8.1* 7.6*  HCT 25.2* 23.8*  PLT 266 243   BMET Recent Labs    02/11/19 0500 02/12/19 0430  NA 142 138  K 3.6 3.6  CL 110 107  CO2 27 24  GLUCOSE 155* 166*  BUN 21 18  CREATININE 0.52 0.52  CALCIUM 8.0* 7.9*   PT/INR No results for input(s): LABPROT, INR in the last 72 hours. ABG No results for input(s): PHART, HCO3 in the last 72 hours.  Invalid input(s): PCO2, PO2  Studies/Results: No results found.  Anti-infectives: Anti-infectives (From admission, onward)   Start     Dose/Rate Route Frequency Ordered Stop   02/02/19 0600  meropenem (MERREM) 1 g in sodium chloride 0.9 % 100 mL IVPB     1 g 200 mL/hr over 30 Minutes Intravenous Every 12 hours 02/01/19 2243 02/04/19 0604   02/01/19 1730  clindamycin (CLEOCIN) IVPB 900 mg     900 mg 100 mL/hr over 30 Minutes Intravenous On call to O.R. 02/01/19 1654 02/01/19 2009   02/01/19 1730  gentamicin (GARAMYCIN) 310 mg in dextrose 5 % 100 mL IVPB     5 mg/kg  61.2 kg 107.8 mL/hr over 60  Minutes Intravenous On call to O.R. 02/01/19 1654 02/01/19 1957   02/01/19 1545  meropenem (MERREM) 1 g in sodium chloride 0.9 % 100 mL IVPB     1 g 200 mL/hr over 30 Minutes Intravenous STAT 02/01/19 1533 02/01/19 1724      Assessment/Plan: POD12elap, sbr- Gross -appears to be resolving ileus clinically -tol clears, not a lot of intake and has bowel function, pulled picc, we can try to leave out off tpn and go to fulls with supplements today -oob as much as possible, pulm toilet -bid dressing changes Atrial fibrillation -on Xarelto/flecainide/Cardizem DVT prophylaxis - onxarelto for afib Anemia-hct pending this am, will follow this can continue xarelto I think still Mildly confused- seems to get worse at night. Not surprising given age/surgery ID: wbc up on cbc, will follow, afebrile, abdomen benign and ileus getting better, if persists without other source will consider ct scan  Rolm Bookbinder 02/13/2019

## 2019-02-14 LAB — CBC
HCT: 25.7 % — ABNORMAL LOW (ref 36.0–46.0)
Hemoglobin: 8.2 g/dL — ABNORMAL LOW (ref 12.0–15.0)
MCH: 35.5 pg — ABNORMAL HIGH (ref 26.0–34.0)
MCHC: 31.9 g/dL (ref 30.0–36.0)
MCV: 111.3 fL — ABNORMAL HIGH (ref 80.0–100.0)
Platelets: 279 10*3/uL (ref 150–400)
RBC: 2.31 MIL/uL — ABNORMAL LOW (ref 3.87–5.11)
RDW: 15.9 % — ABNORMAL HIGH (ref 11.5–15.5)
WBC: 8.9 10*3/uL (ref 4.0–10.5)
nRBC: 0 % (ref 0.0–0.2)

## 2019-02-14 LAB — COMPREHENSIVE METABOLIC PANEL
ALT: 20 U/L (ref 0–44)
AST: 17 U/L (ref 15–41)
Albumin: 2.7 g/dL — ABNORMAL LOW (ref 3.5–5.0)
Alkaline Phosphatase: 97 U/L (ref 38–126)
Anion gap: 8 (ref 5–15)
BUN: 16 mg/dL (ref 8–23)
CO2: 24 mmol/L (ref 22–32)
Calcium: 8.3 mg/dL — ABNORMAL LOW (ref 8.9–10.3)
Chloride: 109 mmol/L (ref 98–111)
Creatinine, Ser: 0.62 mg/dL (ref 0.44–1.00)
GFR calc Af Amer: >60 mL/min (ref >60)
GFR calc non Af Amer: >60 mL/min (ref >60)
Glucose, Bld: 91 mg/dL (ref 70–99)
Potassium: 3.5 mmol/L (ref 3.5–5.1)
Sodium: 141 mmol/L (ref 135–145)
Total Bilirubin: 1.5 mg/dL — ABNORMAL HIGH (ref 0.3–1.2)
Total Protein: 5.1 g/dL — ABNORMAL LOW (ref 6.5–8.1)

## 2019-02-14 LAB — DIFFERENTIAL
Abs Immature Granulocytes: 0.05 10*3/uL (ref 0.00–0.07)
Basophils Absolute: 0.1 10*3/uL (ref 0.0–0.1)
Basophils Relative: 1 %
Eosinophils Absolute: 0.2 10*3/uL (ref 0.0–0.5)
Eosinophils Relative: 2 %
Immature Granulocytes: 1 %
Lymphocytes Relative: 20 %
Lymphs Abs: 1.8 10*3/uL (ref 0.7–4.0)
Monocytes Absolute: 0.8 10*3/uL (ref 0.1–1.0)
Monocytes Relative: 9 %
Neutro Abs: 6.1 10*3/uL (ref 1.7–7.7)
Neutrophils Relative %: 67 %

## 2019-02-14 LAB — PHOSPHORUS: Phosphorus: 3.3 mg/dL (ref 2.5–4.6)

## 2019-02-14 LAB — PREALBUMIN: Prealbumin: 8.3 mg/dL — ABNORMAL LOW (ref 18–38)

## 2019-02-14 LAB — TRIGLYCERIDES: Triglycerides: 95 mg/dL (ref <150)

## 2019-02-14 LAB — MAGNESIUM: Magnesium: 2 mg/dL (ref 1.7–2.4)

## 2019-02-14 MED ORDER — RIVAROXABAN 20 MG PO TABS
20.0000 mg | ORAL_TABLET | Freq: Every day | ORAL | Status: DC
  Administered 2019-02-14 – 2019-02-15 (×2): 20 mg via ORAL
  Filled 2019-02-14 (×2): qty 1

## 2019-02-14 MED ORDER — METOPROLOL TARTRATE 50 MG PO TABS
100.0000 mg | ORAL_TABLET | Freq: Two times a day (BID) | ORAL | Status: DC
  Administered 2019-02-14 – 2019-02-16 (×3): 100 mg via ORAL
  Filled 2019-02-14: qty 4
  Filled 2019-02-14: qty 2
  Filled 2019-02-14: qty 8
  Filled 2019-02-14: qty 2

## 2019-02-14 MED ORDER — DIGOXIN 125 MCG PO TABS
0.1250 mg | ORAL_TABLET | Freq: Three times a day (TID) | ORAL | Status: AC
  Administered 2019-02-14 – 2019-02-15 (×3): 0.125 mg via ORAL
  Filled 2019-02-14 (×3): qty 1

## 2019-02-14 MED ORDER — METOPROLOL TARTRATE 25 MG PO TABS
50.0000 mg | ORAL_TABLET | Freq: Once | ORAL | Status: AC
  Administered 2019-02-14: 50 mg via ORAL
  Filled 2019-02-14: qty 2

## 2019-02-14 MED ORDER — CHLORHEXIDINE GLUCONATE CLOTH 2 % EX PADS: 6.0000 | MEDICATED_PAD | Freq: Every day | CUTANEOUS | Status: DC

## 2019-02-14 MED ORDER — POTASSIUM CHLORIDE CRYS ER 20 MEQ PO TBCR
40.0000 meq | EXTENDED_RELEASE_TABLET | Freq: Once | ORAL | Status: AC
  Administered 2019-02-14: 09:00:00 40 meq via ORAL
  Filled 2019-02-14: qty 2

## 2019-02-14 NOTE — Progress Notes (Addendum)
     Assessment & Plan: POD#13 -ex lap with small bowel resection for closed loop obstruction - 12/22 - Dr. Johney Maine -resolving ileus with BM's last night and this AM -full liquid diet today -oob as much as possible, pulm toilet -bid dressing changes Atrial fibrillation -on Xarelto/flecainide/Cardizem DVT prophylaxis - onxarelto for afib Anemia - Hgb 8.2, stable ID: WBC 8.9 this AM  Good progress from surgical standpoint.  May transfer to floor when medically stable.  Continue BID dressing changes to abdominal wound.        Armandina Gemma, MD       [facility]       Office: [phone]   Chief Complaint: Closed loop obstruction, post op ileus  Subjective: Patient up at bedside, comfortable.  Tolerating full liquid diet, slight nausea last PM.  BM's last night and this AM.  Nurse at bedside.  Objective: Vital signs in last 24 hours: Temp:  [97.4 F (36.3 C)-98.5 F (36.9 C)] 98.4 F (36.9 C) (01/04 0400) Pulse Rate:  [102-129] 115 (01/04 0400) Resp:  [20-31] 21 (01/04 0400) BP: (115-134)/(81-93) 134/91 (01/03 1955) SpO2:  [92 %-100 %] 99 % (01/04 0400) Last BM Date: 02/13/19  Intake/Output from previous day: 01/03 0701 - 01/04 0700 In: 432.3 [P.O.:350; I.V.:82.3] Out: 1250 [Urine:1250] Intake/Output this shift: No intake/output data recorded.  Physical Exam: HEENT - sclerae clear, mucous membranes moist Neck - soft Chest - clear bilaterally Cor - irreg, 120's Abdomen - soft, dressing intact; BS present Neuro - alert & oriented, no focal deficits  Lab Results:  Recent Labs    02/13/19 0841 02/14/19 0326  WBC 11.6* 8.9  HGB 9.0* 8.2*  HCT 27.8* 25.7*  PLT 246 279   BMET Recent Labs    02/12/19 0430 02/14/19 0326  NA 138 141  K 3.6 3.5  CL 107 109  CO2 24 24  GLUCOSE 166* 91  BUN 18 16  CREATININE 0.52 0.62  CALCIUM 7.9* 8.3*   PT/INR No results for input(s): LABPROT, INR in the last 72 hours. Comprehensive Metabolic  Panel:    Component Value Date/Time   NA 141 02/14/2019 0326   NA 138 02/12/2019 0430   NA 141 11/01/2018 1312   NA 141 02/18/2016 1554   K 3.5 02/14/2019 0326   K 3.6 02/12/2019 0430   CL 109 02/14/2019 0326   CL 107 02/12/2019 0430   CO2 24 02/14/2019 0326   CO2 24 02/12/2019 0430   BUN 16 02/14/2019 0326   BUN 18 02/12/2019 0430   BUN 12 11/01/2018 1312   BUN 18 02/18/2016 1554   CREATININE 0.62 02/14/2019 0326   CREATININE 0.52 02/12/2019 0430   GLUCOSE 91 02/14/2019 0326   GLUCOSE 166 (H) 02/12/2019 0430   CALCIUM 8.3 (L) 02/14/2019 0326   CALCIUM 7.9 (L) 02/12/2019 0430   AST 17 02/14/2019 0326   AST 22 02/11/2019 0500   ALT 20 02/14/2019 0326   ALT 22 02/11/2019 0500   ALKPHOS 97 02/14/2019 0326   ALKPHOS 70 02/11/2019 0500   BILITOT 1.5 (H) 02/14/2019 0326   BILITOT 1.0 02/11/2019 0500   PROT 5.1 (L) 02/14/2019 0326   PROT 4.5 (L) 02/11/2019 0500   ALBUMIN 2.7 (L) 02/14/2019 0326   ALBUMIN 2.2 (L) 02/11/2019 0500    Studies/Results: No results found.    Armandina Gemma 02/14/2019  Patient ID: Megan Hester, female   DOB: 1931/01/01, 86 y.o.   MRN: [id number]

## 2019-02-14 NOTE — Progress Notes (Signed)
PROGRESS NOTE    Megan Hester  [id number] DOB: Feb 05, 1931 DOA: 02/01/2019 PCP: [name], MD    Brief Narrative:  86 y.o. F with A. fib, SSS with pacer, on Xarelto, HTN, history DCIS, and enucleation of the right eye who presented with 1 day abdominal pain nausea and vomiting.  In the ER, she was hypotensive, had AKI, and CT of the abdomen and pelvis showed closed-loop obstruction with possible early ischemia.  She underwent urgent surgical intervention with LOA, small bowel resection, and application of wound VAC.  Interim course: Postoperative day she has had some delirium. She is also developed A. fib with RVR, for which cardiology was consulted she was started on amiodarone drip, but this was stopped due to elevated LFTs.   Assessment & Plan:   Principal Problem:   Closed loop SBO (small bowel obstruction)  Active Problems:   A-fib (HCC)   Hypertension   Sick sinus syndrome (HCC)   Sepsis (HCC)   Chronic anticoagulation   Cardiac pacemaker in place - [employer] [id number] Advisa DR MRI   Rectocele   DM (diabetes mellitus) ([place])   History of adenomatous polyp of colon   Bowel obstruction (HCC)   Acute focal ischemia of small intestine (HCC)   Malnutrition of moderate degree   SIRS (systemic inflammatory response syndrome) (HCC)   Acute blood loss anemia  Small bowel obstruction S/p laparoscopic LOA, small bowel resection 12/22 by Dr. Johney Maine Postop day 12 -Recent concerns of post-op ileus with NG placed. Now improving -Planning for removing NG 1/1 per surgery -Wound VAC per general surgery -Started TNA per General Surgery, pt lost IV access, thus TNA stopped -diet advanced to fulls per general surgery. OK for transfer to floor from surgical standpoint. Pending improvement of RVR per Cardiology  Chronic atrial fibrillation with RVR Sick sinus syndrome with pacer -Had been on flecainide, cannot restart for 2 weeks.  Has been on amiodarone, but LFTs  were rising, so we would like to avoid this.   -Pt is continued on Xarelto. -Cardiology following. Currently on oral amiodarone and metoprolol 50mg  bid --HR remains tachycardic. Flecainide on hold, plans to resume as outpatient per Cardiology  Delirium -Post-op delirium expected in 86 yo. -Seems stable this AM   Hypertension -Metoprolol increased to 50mg  bid -BP now stable  AKI -Cr 1.9 at admission -Normalized with IVF -Repeat bmet in AM  Sepsis ruled out -Patient presented with SIRS physiology and elevated lactate. There was initial concern for sepsis, She was treated with 3 days of meropenem, but this resolved after small bowel resection, and she has had no further signs of infection, and likely her SIRS was from bowel ischemia. -Currently stable  Acute blood loss anemia  Macrocytic, B12, TSH and folate normal. -some BRBPR noted recently -Serial Hgb unremarkable   Other medications -Continue eye drops -Anticipate resuming sertraline at time of discharge   DVT prophylaxis: Xarelto Code Status: Full Family Communication: Pt in room, family not at bedside Disposition Plan: Uncertain at this time  Consultants:   Cardiology  General Surgery  Procedures:   12/22 Lap LOA and small bowel resection  Antimicrobials: Anti-infectives (From admission, onward)   Start     Dose/Rate Route Frequency Ordered Stop   02/02/19 0600  meropenem (MERREM) 1 g in sodium chloride 0.9 % 100 mL IVPB     1 g 200 mL/hr over 30 Minutes Intravenous Every 12 hours 02/01/19 2243 02/04/19 0604   02/01/19 1730  clindamycin (CLEOCIN) IVPB 900 mg  900 mg 100 mL/hr over 30 Minutes Intravenous On call to O.R. 02/01/19 1654 02/01/19 2009   02/01/19 1730  gentamicin (GARAMYCIN) 310 mg in dextrose 5 % 100 mL IVPB     5 mg/kg  61.2 kg 107.8 mL/hr over 60 Minutes Intravenous On call to O.R. 02/01/19 1654 02/01/19 1957   02/01/19 1545  meropenem (MERREM) 1 g in sodium chloride 0.9 % 100 mL  IVPB     1 g 200 mL/hr over 30 Minutes Intravenous STAT 02/01/19 1533 02/01/19 1724      Subjective: Complaining about finger sticks.  Objective: Vitals:   02/14/19 0400 02/14/19 0800 02/14/19 1200 02/14/19 1222  BP:  (!) 129/98  120/71  Pulse: (!) 115 (!) 114  (!) 111  Resp: (!) 21 (!) 22  20  Temp: 98.4 F (36.9 C) 97.8 F (36.6 C) 97.9 F (36.6 C)   TempSrc: Oral Oral Oral   SpO2: 99% 100%  100%  Weight:      Height:        Intake/Output Summary (Last 24 hours) at 02/14/2019 1322 Last data filed at 02/14/2019 0730 Gross per 24 hour  Intake 200 ml  Output 1350 ml  Net -1150 ml   Filed Weights   02/11/19 0436 02/12/19 0449 02/13/19 0319  Weight: 65.1 kg 65.1 kg 65.1 kg    Examination: General exam: Conversant, in no acute distress Respiratory system: normal chest rise, clear, no audible wheezing Cardiovascular system: regular rhythm, s1-s2 Gastrointestinal system: Nondistended, nontender, pos BS Central nervous system: No seizures, no tremors Extremities: No cyanosis, no joint deformities Skin: No rashes, no pallor Psychiatry: Affect normal // no auditory hallucinations   Data Reviewed: I have personally reviewed following labs and imaging studies  CBC: Recent Labs  Lab 02/10/19 0805 02/11/19 0500 02/11/19 1530 02/12/19 1258 02/13/19 0841 02/14/19 0326  WBC 8.6 9.6 13.8* 12.6* 11.6* 8.9  NEUTROABS 6.6  --   --   --   --  6.1  HGB 8.3* 7.3* 8.1* 7.6* 9.0* 8.2*  HCT 26.6* 22.8* 25.2* 23.8* 27.8* 25.7*  MCV 114.7* 114.0* 114.0* 113.9* 112.6* 111.3*  PLT 227 221 266 243 246 [id number]   Basic Metabolic Panel: Recent Labs  Lab 02/08/19 0342 02/09/19 0500 02/10/19 0805 02/11/19 0500 02/12/19 0430 02/14/19 0326  NA 141 141 145 142 138 141  K 3.8 3.6 3.1* 3.6 3.6 3.5  CL 107 106 107 110 107 109  CO2 23 22 27 27 24 24   GLUCOSE 101* 98 159* 155* 166* 91  BUN 22 27* 26* 21 18 16   CREATININE 0.63 0.57 0.57 0.52 0.52 0.62  CALCIUM 8.4* 8.2* 8.1* 8.0* 7.9*  8.3*  MG 2.0 2.0 2.1 1.8  --  2.0  PHOS  --   --  2.0* 2.2* 3.0 3.3   GFR: Estimated Creatinine Clearance: 43.6 mL/min (by C-G formula based on SCr of 0.62 mg/dL). Liver Function Tests: Recent Labs  Lab 02/08/19 0342 02/09/19 0500 02/10/19 0805 02/11/19 0500 02/14/19 0326  AST 27 22 19 22 17   ALT 24 20 21 22 20   ALKPHOS 81 71 71 70 97  BILITOT 1.7* 1.7* 1.4* 1.0 1.5*  PROT 5.0* 4.9* 4.8* 4.5* 5.1*  ALBUMIN 2.6* 2.6* 2.6* 2.2* 2.7*   No results for input(s): LIPASE, AMYLASE in the last 168 hours. No results for input(s): AMMONIA in the last 168 hours. Coagulation Profile: No results for input(s): INR, PROTIME in the last 168 hours. Cardiac Enzymes: No results for input(s): CKTOTAL, CKMB,  CKMBINDEX, TROPONINI in the last 168 hours. BNP (last 3 results) No results for input(s): PROBNP in the last 8760 hours. HbA1C: No results for input(s): HGBA1C in the last 72 hours. CBG: Recent Labs  Lab 02/12/19 1932 02/12/19 2310 02/13/19 0331 02/13/19 0729 02/13/19 1127  GLUCAP 128* 116* 99 96 120*   Lipid Profile: Recent Labs    02/14/19 0326  TRIG 95   Thyroid Function Tests: No results for input(s): TSH, T4TOTAL, FREET4, T3FREE, THYROIDAB in the last 72 hours. Anemia Panel: No results for input(s): VITAMINB12, FOLATE, FERRITIN, TIBC, IRON, RETICCTPCT in the last 72 hours. Sepsis Labs: No results for input(s): PROCALCITON, LATICACIDVEN in the last 168 hours.  No results found for this or any previous visit (from the past 240 hour(s)).   Radiology Studies: No results found.  Scheduled Meds: . amiodarone  200 mg Oral BID  . bisacodyl  10 mg Rectal Daily  . Chlorhexidine Gluconate Cloth  6 each Topical Daily  . famotidine  20 mg Oral Daily  . feeding supplement (ENSURE ENLIVE)  237 mL Oral BID BM  . lip balm  1 application Topical BID  . mouth rinse  15 mL Mouth Rinse BID  . Melatonin  5 mg Oral QHS  . metoprolol tartrate  50 mg Oral BID  . neomycin-polymyxin  b-dexamethasone  1 application Right Eye QHS  . polyvinyl alcohol   Right Eye BID  . rivaroxaban  20 mg Oral Q supper  . sodium chloride flush  10-40 mL Intracatheter Q12H   Continuous Infusions: . methocarbamol (ROBAXIN) IV    . ondansetron (ZOFRAN) IV       LOS: 13 days   Marylu Lund, MD [facility] Pager On Amion  If 7PM-7AM, please contact night-coverage 02/14/2019, 1:22 PM

## 2019-02-14 NOTE — Progress Notes (Signed)
Physical Therapy Treatment Patient Details Name: Vicy Brana MRN: [id number] DOB: 03/19/1930 Today's Date: 02/14/2019    History of Present Illness 86 y.o. female with medical history significant of sick sinus syndrome, afib on chronic xarelto, HTN, GERD who presented to the ED with 1 day of abd discomfort and nausea/vomiting.  Pt s/p dx lap with LOA and SBR for closed loop bowel obstruction with early perforation and ischemia with primary umbilical hernia repair by Dr. Johney Maine on 02/01/19    PT Comments    Pt assisted with ambulating and able to tolerate improved distance today.  Recommend 24/7 assist available upon d/c for safety.    Follow Up Recommendations  Supervision/Assistance - 24 hour;SNF     Equipment Recommendations  Rolling walker with 5" wheels    Recommendations for Other Services       Precautions / Restrictions Precautions Precautions: Fall Precaution Comments: recent ABD surgery Restrictions Weight Bearing Restrictions: No    Mobility  Bed Mobility Overal bed mobility: Needs Assistance Bed Mobility: Sit to Supine       Sit to supine: Min assist   General bed mobility comments: slight assist for LEs onto bed  Transfers Overall transfer level: Needs assistance Equipment used: Rolling walker (2 wheeled) Transfers: Sit to/from Stand Sit to Stand: Min assist         General transfer comment: verbal cues for hand placement, steadying assist  Ambulation/Gait Ambulation/Gait assistance: Min guard Gait Distance (Feet): 240 Feet Assistive device: Rolling walker (2 wheeled) Gait Pattern/deviations: Step-through pattern;Decreased stride length;Trunk flexed Gait velocity: decr   General Gait Details: cues for posture and position from [employer]             Wheelchair Mobility    Modified Rankin (Stroke Patients Only)       Balance                                            Cognition Arousal/Alertness:  Awake/alert Behavior During Therapy: WFL for tasks assessed/performed Overall Cognitive Status: Impaired/Different from baseline Area of Impairment: Memory                                      Exercises      General Comments        Pertinent Vitals/Pain Pain Assessment: Faces Faces Pain Scale: Hurts a little bit Pain Location: incision/abdomen Pain Descriptors / Indicators: Sore;Guarding;Discomfort Pain Intervention(s): Monitored during session;Repositioned    Home Living                      Prior Function            PT Goals (current goals can now be found in the care plan section) Progress towards PT goals: Progressing toward goals    Frequency    Min 3X/week      PT Plan Current plan remains appropriate    Co-evaluation              AM-PAC PT "6 Clicks" Mobility   Outcome Measure  Help needed turning from your back to your side while in a flat bed without using bedrails?: A Little Help needed moving from lying on your back to sitting on the side of a flat bed without using bedrails?: A  Little Help needed moving to and from a bed to a chair (including a wheelchair)?: A Little Help needed standing up from a chair using your arms (e.g., wheelchair or bedside chair)?: A Little Help needed to walk in hospital room?: A Little Help needed climbing 3-5 steps with a railing? : A Lot 6 Click Score: 17    End of Session Equipment Utilized During Treatment: Gait belt Activity Tolerance: Patient limited by fatigue Patient left: with call bell/phone within reach;in bed;with bed alarm set Nurse Communication: Mobility status PT Visit Diagnosis: Difficulty in walking, not elsewhere classified (R26.2)     Time: [id number] PT Time Calculation (min) (ACUTE ONLY): 13 min  Charges:  $Gait Training: 8-22 mins             Arlyce Dice, DPT Acute Rehabilitation Services Office: [phone]  Trena Platt 02/14/2019, 12:46 PM

## 2019-02-14 NOTE — Progress Notes (Signed)
Progress Note  Patient Name: Megan Hester Date of Encounter: 02/14/2019  Primary Cardiologist: Donato Heinz, MD   Subjective   Ms. Soltis is seen sitting up in her chair.  She says that she feels so good she cannot stand it.  She is very pleasant and oriented but has trouble remembering some things.  She remains in A. fib with rates 100s-110s but she has no awareness of this.  She says that she felt flutters just after her surgery but has not been feeling them lately.  She denies any chest discomfort, shortness of breath.  She has some mild weakness and lightheadedness when she first gets up.  Her abdomen is slightly tender but not bothering her much.  She reportedly had a good bowel movement last night.  Inpatient Medications    Scheduled Meds: . amiodarone  200 mg Oral BID  . bisacodyl  10 mg Rectal Daily  . Chlorhexidine Gluconate Cloth  6 each Topical Daily  . famotidine  20 mg Oral Daily  . feeding supplement (ENSURE ENLIVE)  237 mL Oral BID BM  . insulin aspart  0-9 Units Subcutaneous TID WC  . lip balm  1 application Topical BID  . mouth rinse  15 mL Mouth Rinse BID  . Melatonin  5 mg Oral QHS  . metoprolol tartrate  50 mg Oral BID  . neomycin-polymyxin b-dexamethasone  1 application Right Eye QHS  . polyvinyl alcohol   Right Eye BID  . potassium chloride  40 mEq Oral Once  . rivaroxaban  20 mg Oral Q supper  . sodium chloride flush  10-40 mL Intracatheter Q12H   Continuous Infusions: . methocarbamol (ROBAXIN) IV    . ondansetron (ZOFRAN) IV     PRN Meds: alum & mag hydroxide-simeth, clobetasol cream, diphenhydrAMINE, enalaprilat, haloperidol lactate, magic mouthwash, methocarbamol (ROBAXIN) IV, metoprolol tartrate, morphine injection, naphazoline-glycerin, OLANZapine, ondansetron (ZOFRAN) IV **OR** ondansetron (ZOFRAN) IV, phenol, prochlorperazine, simethicone, sodium chloride flush, sodium chloride flush   Vital Signs    Vitals:   02/13/19 1955  02/13/19 2332 02/14/19 0313 02/14/19 0400  BP: (!) 134/91     Pulse: (!) 121   (!) 115  Resp: 20   (!) 21  Temp:  98.2 F (36.8 C) 98.4 F (36.9 C) 98.4 F (36.9 C)  TempSrc:  Axillary Oral Oral  SpO2: 99%   99%  Weight:      Height:        Intake/Output Summary (Last 24 hours) at 02/14/2019 0805 Last data filed at 02/14/2019 [id number] Gross per 24 hour  Intake 432.31 ml  Output 1250 ml  Net -817.69 ml   Last 3 Weights 02/13/2019 02/12/2019 02/11/2019  Weight (lbs) 143 lb 8.3 oz 143 lb 8.3 oz 143 lb 8.3 oz  Weight (kg) 65.1 kg 65.1 kg 65.1 kg      Telemetry    Atrial fibrillation in the 100s-110s- Personally Reviewed  ECG    No new tracings for review  Physical Exam   GEN: No acute distress.   Neck: No JVD Cardiac: Irregularly irregular rhythm, no murmurs, rubs, or gallops.  Respiratory: Clear to auscultation bilaterally. GI: Soft, non-distended, mild tenderness MS: No edema; No deformity. Neuro:  Nonfocal  Psych: Normal affect   Labs    High Sensitivity Troponin:  No results for input(s): TROPONINIHS in the last 720 hours.    Chemistry Recent Labs  Lab 02/10/19 0805 02/11/19 0500 02/12/19 0430 02/14/19 0326  NA 145 142 138 141  K 3.1* 3.6 3.6 3.5  CL 107 110 107 109  CO2 27 27 24 24   GLUCOSE 159* 155* 166* 91  BUN 26* 21 18 16   CREATININE 0.57 0.52 0.52 0.62  CALCIUM 8.1* 8.0* 7.9* 8.3*  PROT 4.8* 4.5*  --  5.1*  ALBUMIN 2.6* 2.2*  --  2.7*  AST 19 22  --  17  ALT 21 22  --  20  ALKPHOS 71 70  --  97  BILITOT 1.4* 1.0  --  1.5*  GFRNONAA >60 >60 >60 >60  GFRAA >60 >60 >60 >60  ANIONGAP 11 5 7 8      Hematology Recent Labs  Lab 02/12/19 1258 02/13/19 0841 02/14/19 0326  WBC 12.6* 11.6* 8.9  RBC 2.09* 2.47* 2.31*  HGB 7.6* 9.0* 8.2*  HCT 23.8* 27.8* 25.7*  MCV 113.9* 112.6* 111.3*  MCH 36.4* 36.4* 35.5*  MCHC 31.9 32.4 31.9  RDW 15.1 15.7* 15.9*  PLT 243 246 279    BNPNo results for input(s): BNP, PROBNP in the last 168 hours.   DDimer No  results for input(s): DDIMER in the last 168 hours.   Radiology    No results found.  Cardiac Studies   Echo 02/08/19: IMPRESSIONS  1. Left ventricular ejection fraction, by visual estimation, is 50 to 55%. The left ventricle has low normal function. Left ventricular septal wall thickness was normal. Normal left ventricular posterior wall thickness. There is no left ventricular  hypertrophy. 2. Left ventricular diastolic parameters are indeterminate. 3. The left ventricle has no regional wall motion abnormalities. 4. Global right ventricle has normal systolic function.The right ventricular size is normal. No increase in right ventricular wall thickness. 5. Left atrial size was normal. 6. Right atrial size was normal. 7. The mitral valve is normal in structure. Trivial mitral valve regurgitation. No evidence of mitral stenosis. 8. The tricuspid valve is normal in structure. 9. The aortic valve is tricuspid. Aortic valve regurgitation is not visualized. No evidence of aortic valve sclerosis or stenosis. 10. The pulmonic valve was normal in structure. Pulmonic valve regurgitation is not visualized. 11. Mildly elevated pulmonary artery systolic pressure. 12. A pacer wire is visualized. 13. The inferior vena cava is normal in size with greater than 50% respiratory variability, suggesting right atrial pressure of 3 mmHg.  Patient Profile     86 y.o. female with persistent atrial fibrillation, SSS s/p PPM, and hypertension admited with bowel obstruction now s/p urgent small bowel resection and wound vac.  Assessment & Plan    #PAF -Initially loaded with IV amiodarone, Now on amiodarone 200 mg BID, with metoprolol to help with rate control. Metoprolol increased to 50 mg BID yesterday, first dose last night.  -Continues in atrial fibrillation with rates 100s-110s but patient is asymptomatic.  Continue to monitor on increased metoprolol dose. -On Xarelto for stroke risk reduction.    #SSS -Has PPM.   #Hypertension -Home diltiazem discontinued due to hypotension. Now on metoprolol.  -BP fairly well controlled. DBP is still on the higher side. Continue watch with increase in metoprolol.   #Leukocytosis -No clear source, now resolved. WBCs 8.9 today.   #Delirium -Patient had some postoperative delirium.  Today she is very pleasant, alert and oriented, still has some trouble remembering certain things.  #Acute blood loss anemia -Stable. Hgb 8.2 today, 9.0 yesterday. -Continuing to monitor.       For questions or updates, please contact [place] Please consult [email or web address] for contact info under  Signed, Daune Perch, NP  02/14/2019, 8:05 AM

## 2019-02-15 ENCOUNTER — Telehealth: Payer: Self-pay | Admitting: Cardiology

## 2019-02-15 LAB — CREATININE, SERUM
Creatinine, Ser: 0.63 mg/dL (ref 0.44–1.00)
GFR calc Af Amer: >60 mL/min (ref >60)
GFR calc non Af Amer: >60 mL/min (ref >60)

## 2019-02-15 MED ORDER — DIGOXIN 125 MCG PO TABS
0.1250 mg | ORAL_TABLET | Freq: Every day | ORAL | Status: DC
  Administered 2019-02-16: 0.125 mg via ORAL
  Filled 2019-02-15: qty 1

## 2019-02-15 NOTE — Telephone Encounter (Signed)
New Message   Scheduled TOC visit per Pecolia Ades NP with Dr. Gardiner Rhyme on 02/24/2019 at 9:20 am.

## 2019-02-15 NOTE — Progress Notes (Signed)
Occupational Therapy Treatment and goal update Patient Details Name: Megan Hester MRN: [id number] DOB: 09/17/1930 Today's Date: 02/15/2019    History of present illness 86 y.o. female with medical history significant of sick sinus syndrome, afib on chronic xarelto, HTN, GERD who presented to the ED with 1 day of abd discomfort and nausea/vomiting.  Pt s/p dx lap with LOA and SBR for closed loop bowel obstruction with early perforation and ischemia with primary umbilical hernia repair by Dr. Johney Maine on 02/01/19   OT comments  Pt is making slow progress in OT. She has improved on following commands. Endurance is limited and she was in/out of [facility] today.  Updated goals today.  Pt continues to be motivated and will benefit from continued OT   Follow Up Recommendations  Supervision/Assistance - 24 hour;Home health OT    Equipment Recommendations  3 in 1 bedside commode    Recommendations for Other Services      Precautions / Restrictions Precautions Precautions: Fall Precaution Comments: recent ABD surgery Restrictions Weight Bearing Restrictions: No       Mobility Bed Mobility             Sit to sidelying: Mod assist General bed mobility comments: assist for legs  Transfers       Sit to Stand: Min assist              Balance                                           ADL either performed or assessed with clinical judgement   ADL       Grooming: Minimal assistance;Oral care   Upper Body Bathing: Minimal assistance   Lower Body Bathing: Maximal assistance   Upper Body Dressing : Minimal assistance                     General ADL Comments: Pt had just used BSC with NT. Sat EOB for bathing/new gown and brushing teeth     Vision   Additional Comments: prosthetic eye on R   Perception     Praxis      Cognition Arousal/Alertness: Awake/alert Behavior During Therapy: WFL for tasks assessed/performed Overall Cognitive  Status: Impaired/Different from baseline Area of Impairment: Memory                               General Comments: reinforced abdominal sx precautions        Exercises     Shoulder Instructions       General Comments pt in/out of VTACH.  HR 104-132    Pertinent Vitals/ Pain       Pain Assessment: Faces Faces Pain Scale: Hurts little more Pain Location: incision/abdomen Pain Descriptors / Indicators: Sore Pain Intervention(s): Limited activity within patient's tolerance;Monitored during session;Repositioned  Home Living                                          Prior Functioning/Environment              Frequency  Min 2X/week        Progress Toward Goals  OT Goals(current goals can now be found in the care plan section)  Progress towards OT goals: Progressing toward goals(slowly)  Acute Rehab OT Goals Time For Goal Achievement: 03/01/19 ADL Goals Pt Will Transfer to Toilet: ambulating;bedside commode;with min guard assist Pt Will Perform Toileting - Clothing Manipulation and hygiene: with mod assist;sit to/from stand Additional ADL Goal #1: pt will perform LB bathing and donning pnats with reacher with min A Additional ADL Goal #2: pt will tolerate 30 minutes activity with 3 rest breaks to increase endurance for adls  Plan      Co-evaluation                 AM-PAC OT "6 Clicks" Daily Activity     Outcome Measure   Help from another person eating meals?: None Help from another person taking care of personal grooming?: A Little Help from another person toileting, which includes using toliet, bedpan, or urinal?: Total Help from another person bathing (including washing, rinsing, drying)?: A Lot Help from another person to put on and taking off regular upper body clothing?: A Little Help from another person to put on and taking off regular lower body clothing?: Total 6 Click Score: 14    End of Session    OT Visit  Diagnosis: Muscle weakness (generalized) (M62.81);Unsteadiness on feet (R26.81)   Activity Tolerance Patient limited by fatigue   Patient Left in bed;with call bell/phone within reach;with bed alarm set   Nurse Communication          Time: [phone] OT Time Calculation (min): 22 min  Charges: OT General Charges $OT Visit: 1 Visit OT Treatments $Self Care/Home Management : 8-22 mins  Brayn Eckstein S, OTR/L Acute Rehabilitation Services 02/15/2019   Kaylin Schellenberg 02/15/2019, 10:35 AM

## 2019-02-15 NOTE — Progress Notes (Signed)
Progress Note  Patient Name: Megan Hester Date of Encounter: 02/15/2019  Primary Cardiologist: Donato Heinz, MD   Subjective   Feeling well.  No chest pain or palpitations.  No complaints.  Inpatient Medications    Scheduled Meds: . bisacodyl  10 mg Rectal Daily  . Chlorhexidine Gluconate Cloth  6 each Topical Daily  . famotidine  20 mg Oral Daily  . feeding supplement (ENSURE ENLIVE)  237 mL Oral BID BM  . lip balm  1 application Topical BID  . mouth rinse  15 mL Mouth Rinse BID  . Melatonin  5 mg Oral QHS  . metoprolol tartrate  100 mg Oral BID  . neomycin-polymyxin b-dexamethasone  1 application Right Eye QHS  . polyvinyl alcohol   Right Eye BID  . rivaroxaban  20 mg Oral Q supper  . sodium chloride flush  10-40 mL Intracatheter Q12H   Continuous Infusions: . methocarbamol (ROBAXIN) IV    . ondansetron (ZOFRAN) IV     PRN Meds: alum & mag hydroxide-simeth, clobetasol cream, diphenhydrAMINE, enalaprilat, haloperidol lactate, magic mouthwash, methocarbamol (ROBAXIN) IV, metoprolol tartrate, morphine injection, naphazoline-glycerin, OLANZapine, ondansetron (ZOFRAN) IV **OR** ondansetron (ZOFRAN) IV, phenol, prochlorperazine, simethicone, sodium chloride flush, sodium chloride flush   Vital Signs    Vitals:   02/15/19 0800 02/15/19 0942 02/15/19 1000 02/15/19 1038  BP:      Pulse: (!) 56 (!) 112  (!) 108  Resp: (!) 25  (!) 23 (!) 25  Temp: 98.1 F (36.7 C)     TempSrc: Oral     SpO2: 97%  100% 99%  Weight:      Height:        Intake/Output Summary (Last 24 hours) at 02/15/2019 1441 Last data filed at 02/15/2019 0800 Gross per 24 hour  Intake 200 ml  Output 1000 ml  Net -800 ml   Last 3 Weights 02/15/2019 02/13/2019 02/12/2019  Weight (lbs) 139 lb 1.8 oz 143 lb 8.3 oz 143 lb 8.3 oz  Weight (kg) 63.1 kg 65.1 kg 65.1 kg      Telemetry    Atrial fibrillation. PVCs.  Rate 80s-100s.  - Personally Reviewed  ECG    n/a - Personally  Reviewed  Physical Exam   VS:  BP 115/71   Pulse (!) 108   Temp 98.1 F (36.7 C) (Oral)   Resp (!) 25   Ht 5\' 4"  (1.626 m)   Wt 63.1 kg   SpO2 99%   BMI 23.88 kg/m  , BMI Body mass index is 23.88 kg/m. GENERAL:  Well appearing.  No acute distress.  Frail, elderly woman HEENT: R eye enucleation. Fundus not visualized.  Oral mucosa unremarkable NECK:  No jugular venous distention, waveform within normal limits, carotid upstroke brisk and symmetric, no bruits LUNGS:  Clear to auscultation bilaterally HEART:  Irregularly irregular PMI not displaced or sustained,S1 and S2 within normal limits, no S3, no S4, no clicks, no rubs, no murmurs ABD:  Flat, positive bowel sounds normal in frequency in pitch, no bruits, no rebound, no guarding, no midline pulsatile mass, no hepatomegaly, no splenomegaly EXT:  2 plus pulses throughout, no edema, no cyanosis no clubbing SKIN:  No rashes no nodules NEURO:  Cranial nerves II through XII grossly intact, motor grossly intact throughout PSYCH:  Cognitively intact, oriented to person place and time   Labs    High Sensitivity Troponin:  No results for input(s): TROPONINIHS in the last 720 hours.    Chemistry Recent  Labs  Lab 02/10/19 0805 02/11/19 0500 02/12/19 0430 02/14/19 0326 02/15/19 0144  NA 145 142 138 141  --   K 3.1* 3.6 3.6 3.5  --   CL 107 110 107 109  --   CO2 27 27 24 24   --   GLUCOSE 159* 155* 166* 91  --   BUN 26* 21 18 16   --   CREATININE 0.57 0.52 0.52 0.62 0.63  CALCIUM 8.1* 8.0* 7.9* 8.3*  --   PROT 4.8* 4.5*  --  5.1*  --   ALBUMIN 2.6* 2.2*  --  2.7*  --   AST 19 22  --  17  --   ALT 21 22  --  20  --   ALKPHOS 71 70  --  97  --   BILITOT 1.4* 1.0  --  1.5*  --   GFRNONAA >60 >60 >60 >60 >60  GFRAA >60 >60 >60 >60 >60  ANIONGAP 11 5 7 8   --      Hematology Recent Labs  Lab 02/12/19 1258 02/13/19 0841 02/14/19 0326  WBC 12.6* 11.6* 8.9  RBC 2.09* 2.47* 2.31*  HGB 7.6* 9.0* 8.2*  HCT 23.8* 27.8* 25.7*   MCV 113.9* 112.6* 111.3*  MCH 36.4* 36.4* 35.5*  MCHC 31.9 32.4 31.9  RDW 15.1 15.7* 15.9*  PLT 243 246 279    BNPNo results for input(s): BNP, PROBNP in the last 168 hours.   DDimer No results for input(s): DDIMER in the last 168 hours.   Radiology    No results found.  Cardiac Studies   Echo 02/08/19: IMPRESSIONS  1. Left ventricular ejection fraction, by visual estimation, is 50 to 55%. The left ventricle has low normal function. Left ventricular septal wall thickness was normal. Normal left ventricular posterior wall thickness. There is no left ventricular  hypertrophy. 2. Left ventricular diastolic parameters are indeterminate. 3. The left ventricle has no regional wall motion abnormalities. 4. Global right ventricle has normal systolic function.The right ventricular size is normal. No increase in right ventricular wall thickness. 5. Left atrial size was normal. 6. Right atrial size was normal. 7. The mitral valve is normal in structure. Trivial mitral valve regurgitation. No evidence of mitral stenosis. 8. The tricuspid valve is normal in structure. 9. The aortic valve is tricuspid. Aortic valve regurgitation is not visualized. No evidence of aortic valve sclerosis or stenosis. 10. The pulmonic valve was normal in structure. Pulmonic valve regurgitation is not visualized. 11. Mildly elevated pulmonary artery systolic pressure. 12. A pacer wire is visualized. 13. The inferior vena cava is normal in size with greater than 50% respiratory variability, suggesting right atrial pressure of 3 mmHg.  Patient Profile     86 y.o. female with persistent atrial fibrillation, SSS s/p PPM, and hypertension admited with bowel obstruction now s/p urgent small bowel resection and wound vac.  Assessment & Plan    # PAF:  Ms. Paratore remains in atrial fibrillation. Rates are better controlled.  Metoprolol and amiodarone was discontinued due to ineffectiveness and history of  elevated LFTs.  Last dose was 1/4.  She was started on digoxin for rate control.  Plan to resume her home flecainide as an outpatient in 2 weeks.  She remains on Xarelto.  Hemoglobin up to 9 this am.   # SSS: S/p PPM.  # Hypertension: Her home diltiazem was on hold due to hypotension.  BP stable on metoprolol.  # Leukocytosis: Improving.  No clear source.  # Delirium:  Resolved.  # Acute blood loss anemia: Stable  [facility] will sign off.   Medication Recommendations:  Continue digoxin for 2 weeks.  Then transition back to flecainide Other recommendations (labs, testing, etc):  If she needs to remain on digoxin, she will need a level checked Follow up as an outpatient:  We will arrange  For questions or updates, please contact [place] Please consult [email or web address] for contact info under        Signed, Skeet Latch, MD  02/15/2019, 2:41 PM

## 2019-02-15 NOTE — Progress Notes (Signed)
Transferred to 1503 via [place] in stable condition.

## 2019-02-15 NOTE — Progress Notes (Signed)
PROGRESS NOTE    Megan Hester  [id number] DOB: 06/10/1930 DOA: 02/01/2019 PCP: [name], MD    Brief Narrative:  86 y.o. F with A. fib, SSS with pacer, on Xarelto, HTN, history DCIS, and enucleation of the right eye who presented with 1 day abdominal pain nausea and vomiting.  In the ER, she was hypotensive, had AKI, and CT of the abdomen and pelvis showed closed-loop obstruction with possible early ischemia.  She underwent urgent surgical intervention with LOA, small bowel resection, and application of wound VAC.  Interim course: Postoperative day she has had some delirium. She is also developed A. fib with RVR, for which cardiology was consulted she was started on amiodarone drip, but this was stopped due to elevated LFTs.   Assessment & Plan:   Principal Problem:   Closed loop SBO (small bowel obstruction)  Active Problems:   A-fib (HCC)   Hypertension   Sick sinus syndrome (HCC)   Sepsis (HCC)   Chronic anticoagulation   Cardiac pacemaker in place - [employer] [id number] Advisa DR MRI   Rectocele   DM (diabetes mellitus) ([place])   History of adenomatous polyp of colon   Bowel obstruction (HCC)   Acute focal ischemia of small intestine (HCC)   Malnutrition of moderate degree   SIRS (systemic inflammatory response syndrome) (HCC)   Acute blood loss anemia  Small bowel obstruction S/p laparoscopic LOA, small bowel resection 12/22 by Dr. Johney Maine Postop day 12 -Recent concerns of post-op ileus with NG placed. Now improving -Planning for removing NG 1/1 per surgery -Wound VAC per general surgery -diet has since been advanced to regular diet per general surgery. OK for transfer to floor from surgical standpoint. Pending improvement of RVR per Cardiology  Chronic atrial fibrillation with RVR Sick sinus syndrome with pacer -Had been on flecainide, cannot restart for 2 weeks.  Has been on amiodarone, but LFTs were rising, so we would like to avoid this.   -Pt  is continued on Xarelto. -Cardiology following. Currently on oral amiodarone and metoprolol 50mg  bid --HR remains tachycardic. Flecainide on hold, plans to resume as outpatient per Cardiology -Digoxin started by Cardiology, HR seems improved this AM  Delirium -Post-op delirium expected in 86 yo. -Seems stable at this time   Hypertension -Metoprolol increased to 50mg  bid -BP currently stable  AKI -Cr 1.9 at admission -Normalized with IVF -Recheck bmet in AM  Sepsis ruled out -Patient presented with SIRS physiology and elevated lactate. There was initial concern for sepsis, She was treated with 3 days of meropenem, but this resolved after small bowel resection, and she has had no further signs of infection, and likely her SIRS was from bowel ischemia. -Pt presently hemodynamically stable  Acute blood loss anemia  Macrocytic, B12, TSH and folate normal. -some BRBPR noted recently -Serial Hgb reviewed, unremarkable   Other medications -Continue eye drops -Anticipate resuming sertraline at time of discharge   DVT prophylaxis: Xarelto Code Status: Full Family Communication: Pt in room, family not at bedside Disposition Plan: Uncertain at this time  Consultants:   Cardiology  General Surgery  Procedures:   12/22 Lap LOA and small bowel resection  Antimicrobials: Anti-infectives (From admission, onward)   Start     Dose/Rate Route Frequency Ordered Stop   02/02/19 0600  meropenem (MERREM) 1 g in sodium chloride 0.9 % 100 mL IVPB     1 g 200 mL/hr over 30 Minutes Intravenous Every 12 hours 02/01/19 2243 02/04/19 0604   02/01/19 1730  clindamycin (CLEOCIN) IVPB 900 mg     900 mg 100 mL/hr over 30 Minutes Intravenous On call to O.R. 02/01/19 1654 02/01/19 2009   02/01/19 1730  gentamicin (GARAMYCIN) 310 mg in dextrose 5 % 100 mL IVPB     5 mg/kg  61.2 kg 107.8 mL/hr over 60 Minutes Intravenous On call to O.R. 02/01/19 1654 02/01/19 1957   02/01/19 1545  meropenem  (MERREM) 1 g in sodium chloride 0.9 % 100 mL IVPB     1 g 200 mL/hr over 30 Minutes Intravenous STAT 02/01/19 1533 02/01/19 1724      Subjective: Without complaints. Eager to go home  Objective: Vitals:   02/15/19 0800 02/15/19 0942 02/15/19 1000 02/15/19 1038  BP:      Pulse: (!) 56 (!) 112  (!) 108  Resp: (!) 25  (!) 23 (!) 25  Temp: 98.1 F (36.7 C)     TempSrc: Oral     SpO2: 97%  100% 99%  Weight:      Height:        Intake/Output Summary (Last 24 hours) at 02/15/2019 1415 Last data filed at 02/15/2019 0800 Gross per 24 hour  Intake 200 ml  Output 1000 ml  Net -800 ml   Filed Weights   02/12/19 0449 02/13/19 0319 02/15/19 0500  Weight: 65.1 kg 65.1 kg 63.1 kg    Examination: General exam: Awake, laying in bed, in nad Respiratory system: Normal respiratory effort, no wheezing Cardiovascular system: regular rate, s1, s2 Gastrointestinal system: Soft, nondistended, positive BS Central nervous system: CN2-12 grossly intact, strength intact Extremities: Perfused, no clubbing Skin: Normal skin turgor, no notable skin lesions seen Psychiatry: Mood normal // no visual hallucinations   Data Reviewed: I have personally reviewed following labs and imaging studies  CBC: Recent Labs  Lab 02/10/19 0805 02/11/19 0500 02/11/19 1530 02/12/19 1258 02/13/19 0841 02/14/19 0326  WBC 8.6 9.6 13.8* 12.6* 11.6* 8.9  NEUTROABS 6.6  --   --   --   --  6.1  HGB 8.3* 7.3* 8.1* 7.6* 9.0* 8.2*  HCT 26.6* 22.8* 25.2* 23.8* 27.8* 25.7*  MCV 114.7* 114.0* 114.0* 113.9* 112.6* 111.3*  PLT 227 221 266 243 246 [id number]   Basic Metabolic Panel: Recent Labs  Lab 02/09/19 0500 02/10/19 0805 02/11/19 0500 02/12/19 0430 02/14/19 0326 02/15/19 0144  NA 141 145 142 138 141  --   K 3.6 3.1* 3.6 3.6 3.5  --   CL 106 107 110 107 109  --   CO2 22 27 27 24 24   --   GLUCOSE 98 159* 155* 166* 91  --   BUN 27* 26* 21 18 16   --   CREATININE 0.57 0.57 0.52 0.52 0.62 0.63  CALCIUM 8.2* 8.1* 8.0*  7.9* 8.3*  --   MG 2.0 2.1 1.8  --  2.0  --   PHOS  --  2.0* 2.2* 3.0 3.3  --    GFR: Estimated Creatinine Clearance: 43.6 mL/min (by C-G formula based on SCr of 0.63 mg/dL). Liver Function Tests: Recent Labs  Lab 02/09/19 0500 02/10/19 0805 02/11/19 0500 02/14/19 0326  AST 22 19 22 17   ALT 20 21 22 20   ALKPHOS 71 71 70 97  BILITOT 1.7* 1.4* 1.0 1.5*  PROT 4.9* 4.8* 4.5* 5.1*  ALBUMIN 2.6* 2.6* 2.2* 2.7*   No results for input(s): LIPASE, AMYLASE in the last 168 hours. No results for input(s): AMMONIA in the last 168 hours. Coagulation Profile: No results for  input(s): INR, PROTIME in the last 168 hours. Cardiac Enzymes: No results for input(s): CKTOTAL, CKMB, CKMBINDEX, TROPONINI in the last 168 hours. BNP (last 3 results) No results for input(s): PROBNP in the last 8760 hours. HbA1C: No results for input(s): HGBA1C in the last 72 hours. CBG: Recent Labs  Lab 02/12/19 1932 02/12/19 2310 02/13/19 0331 02/13/19 0729 02/13/19 1127  GLUCAP 128* 116* 99 96 120*   Lipid Profile: Recent Labs    02/14/19 0326  TRIG 95   Thyroid Function Tests: No results for input(s): TSH, T4TOTAL, FREET4, T3FREE, THYROIDAB in the last 72 hours. Anemia Panel: No results for input(s): VITAMINB12, FOLATE, FERRITIN, TIBC, IRON, RETICCTPCT in the last 72 hours. Sepsis Labs: No results for input(s): PROCALCITON, LATICACIDVEN in the last 168 hours.  No results found for this or any previous visit (from the past 240 hour(s)).   Radiology Studies: No results found.  Scheduled Meds: . bisacodyl  10 mg Rectal Daily  . Chlorhexidine Gluconate Cloth  6 each Topical Daily  . famotidine  20 mg Oral Daily  . feeding supplement (ENSURE ENLIVE)  237 mL Oral BID BM  . lip balm  1 application Topical BID  . mouth rinse  15 mL Mouth Rinse BID  . Melatonin  5 mg Oral QHS  . metoprolol tartrate  100 mg Oral BID  . neomycin-polymyxin b-dexamethasone  1 application Right Eye QHS  . polyvinyl  alcohol   Right Eye BID  . rivaroxaban  20 mg Oral Q supper  . sodium chloride flush  10-40 mL Intracatheter Q12H   Continuous Infusions: . methocarbamol (ROBAXIN) IV    . ondansetron (ZOFRAN) IV       LOS: 14 days   Marylu Lund, MD [facility] Pager On Amion  If 7PM-7AM, please contact night-coverage 02/15/2019, 2:15 PM

## 2019-02-15 NOTE — Progress Notes (Addendum)
Assessment & Plan: POD#14 -ex lap with small bowel resection for closed loop obstruction - 12/22 - Dr. Johney Maine -continuing with BM's last night and this AM -regular diet today -discontinue TNA -oob as much as possible, pulm toilet -bid dressing changes Atrial fibrillation -per Cardiology - starting digoxin for rate control DVT prophylaxis -onxarelto for afib Anemia -Hgb 8.2, stable  Good progress from surgical standpoint.  May transfer to floor when medically stable.  Continue BID dressing changes to abdominal wound.        Armandina Gemma, MD       [facility]       Office: [phone]   Chief Complaint: Closed loop obstruction, small bowel resection  Subjective: Patient up in chair, wanted regular food this AM.  Nurse at bedside.  Dressing changed earlier this AM.  Objective: Vital signs in last 24 hours: Temp:  [97.5 F (36.4 C)-98.2 F (36.8 C)] 97.6 F (36.4 C) (01/05 0500) Pulse Rate:  [101-115] 101 (01/05 0400) Resp:  [17-22] 18 (01/05 0400) BP: (115-134)/(67-98) 115/71 (01/05 0400) SpO2:  [98 %-100 %] 98 % (01/05 0400) Weight:  [63.1 kg] 63.1 kg (01/05 0500) Last BM Date: 02/14/19  Intake/Output from previous day: 01/04 0701 - 01/05 0700 In: 200 [P.O.:200] Out: 1100 [Urine:1100] Intake/Output this shift: No intake/output data recorded.  Physical Exam: HEENT - sclerae clear, mucous membranes moist Neck - soft Abdomen - soft without distension; dressing dry and intact; non-tender Ext - no edema, non-tender Neuro - alert & oriented, no focal deficits  Lab Results:  Recent Labs    02/13/19 0841 02/14/19 0326  WBC 11.6* 8.9  HGB 9.0* 8.2*  HCT 27.8* 25.7*  PLT 246 279   BMET Recent Labs    02/14/19 0326 02/15/19 0144  NA 141  --   K 3.5  --   CL 109  --   CO2 24  --   GLUCOSE 91  --   BUN 16  --   CREATININE 0.62 0.63  CALCIUM 8.3*  --    PT/INR No results for input(s): LABPROT, INR in the last 72  hours. Comprehensive Metabolic Panel:    Component Value Date/Time   NA 141 02/14/2019 0326   NA 138 02/12/2019 0430   NA 141 11/01/2018 1312   NA 141 02/18/2016 1554   K 3.5 02/14/2019 0326   K 3.6 02/12/2019 0430   CL 109 02/14/2019 0326   CL 107 02/12/2019 0430   CO2 24 02/14/2019 0326   CO2 24 02/12/2019 0430   BUN 16 02/14/2019 0326   BUN 18 02/12/2019 0430   BUN 12 11/01/2018 1312   BUN 18 02/18/2016 1554   CREATININE 0.63 02/15/2019 0144   CREATININE 0.62 02/14/2019 0326   GLUCOSE 91 02/14/2019 0326   GLUCOSE 166 (H) 02/12/2019 0430   CALCIUM 8.3 (L) 02/14/2019 0326   CALCIUM 7.9 (L) 02/12/2019 0430   AST 17 02/14/2019 0326   AST 22 02/11/2019 0500   ALT 20 02/14/2019 0326   ALT 22 02/11/2019 0500   ALKPHOS 97 02/14/2019 0326   ALKPHOS 70 02/11/2019 0500   BILITOT 1.5 (H) 02/14/2019 0326   BILITOT 1.0 02/11/2019 0500   PROT 5.1 (L) 02/14/2019 0326   PROT 4.5 (L) 02/11/2019 0500   ALBUMIN 2.7 (L) 02/14/2019 0326   ALBUMIN 2.2 (L) 02/11/2019 0500    Studies/Results: No results found.    Armandina Gemma 02/15/2019  Patient ID: Kandra Nicolas, female   DOB:  03-21-1930, 86 y.o.   MRN: [id number]

## 2019-02-15 NOTE — TOC Progression Note (Addendum)
Transition of Care [facility]) - Progression Note    Patient Details  Name: Megan Hester MRN: [id number] Date of Birth: 1930-10-15  [facility] CM/SW Contact  Bethsaida Siegenthaler, Marjie Skiff, RN Phone Number: 02/15/2019, 9:54 AM  Clinical Narrative:    This CM was able to give referral to [facility] for home health services. Per son, pt husband is active with them.  She will need orders for HHPT/OT/RN/Aide RW and 3in1. TOC will continue to follow.   Expected Discharge Plan: [place] Barriers to Discharge: Continued Medical Work up  Expected Discharge Plan and Services Expected Discharge Plan: [place]   Discharge Planning Services: CM Consult Post Acute Care Choice: [place] arrangements for the past 2 months: Single Family Home                                       Social Determinants of Health (SDOH) Interventions    Readmission Risk Interventions Readmission Risk Prevention Plan 02/08/2019  Transportation Screening Complete  PCP or Specialist Appt within 3-5 Days Complete  HRI or [place] Complete  Social Work Consult for [place] Planning/Counseling Complete  Palliative Care Screening Complete  Medication Review [employer]) Complete  Some recent data might be hidden

## 2019-02-16 LAB — DIGOXIN LEVEL: Digoxin Level: 0.4 ng/mL — ABNORMAL LOW (ref 0.8–2.0)

## 2019-02-16 MED ORDER — FLECAINIDE ACETATE 100 MG PO TABS: 100.0000 mg | ORAL_TABLET | Freq: Two times a day (BID) | ORAL | 0 refills | Status: DC

## 2019-02-16 MED ORDER — METOPROLOL TARTRATE 100 MG PO TABS: 100.0000 mg | ORAL_TABLET | Freq: Two times a day (BID) | ORAL | 1 refills | Status: DC

## 2019-02-16 MED ORDER — ACETAMINOPHEN 325 MG PO TABS: 650.0000 mg | ORAL_TABLET | Freq: Four times a day (QID) | ORAL | Status: DC | PRN

## 2019-02-16 MED ORDER — DIGOXIN 125 MCG PO TABS: 0.1250 mg | ORAL_TABLET | Freq: Every day | ORAL | 1 refills | Status: DC

## 2019-02-16 NOTE — Discharge Summary (Signed)
Physician Discharge Summary  Megan Hester [id number] DOB: July 19, 1930 DOA: 02/01/2019  PCP: [name], MD  Admit date: 02/01/2019 Discharge date: 02/16/2019  Admitted From: Home Disposition:  Home   Recommendations for Outpatient Follow-up:  1. Follow up with PCP in 1-2 weeks 2. Please obtain BMP/CBC in one week   Home Health: YES Equipment/Devices: HHPT, RN, Aide  Discharge Condition: Stable CODE STATUS: FULL Diet recommendation: Heart Healthy /   Brief/Interim Summary: 86 y.o.Fwith A. fib, SSS with pacer, on Xarelto, HTN, history DCIS, and enucleation of the right eye who presented with 1 day abdominal pain nausea and vomiting.  In the ER, she was hypotensive, had AKI, and CT of the abdomen and pelvis showed closed-loop obstruction with possible early ischemia. She underwent urgent surgical intervention with LOA, small bowel resection, and application of wound VAC.  Interim course: Postoperative day she has had some delirium. She is also developed A. fib with RVR, for which cardiology was consultedshe was started on amiodarone drip, but this was stopped due to elevated LFTs.  She was started on metoprolol and digoxin with improvement.  Her diet was gradually advanced which she tolerated.  Discharge Diagnoses:  Principal Problem:   Closed loop SBO (small bowel obstruction)  Active Problems:   A-fib (HCC)   Hypertension   Sick sinus syndrome (HCC)   Sepsis (HCC)   Chronic anticoagulation   Cardiac pacemaker in place - Medtronic [id number] Advisa DR MRI   Rectocele   DM (diabetes mellitus) ([place])   History of adenomatous polyp of colon   Bowel obstruction (HCC)   Acute focal ischemia of small intestine (HCC)   Malnutrition of moderate degree   SIRS (systemic inflammatory response syndrome) (HCC)   Acute blood loss anemia  Small bowel obstruction S/plaparoscopic LOA, small bowel resection 12/22 by Dr.Gross Postop day 12 -Recent concerns of  post-op ileus with NG placed. Now improving -Planning for removing NG 1/1 per surgery -Wound VAC per general surgery -diet has since been advanced to regular diet per general surgery. OK for transfer to floor from surgical standpoint.  -discussed with general surgery on 1/6--ok for d/c with bid wet to dry dressing changes  Chronic atrial fibrillation with RVR Sick sinus syndrome with pacer -Had been on flecainide, cannot restart for 2 weeks. Has been on amiodarone, but LFTs were rising, so we would like to avoid this.  -Pt is continued on Xarelto. -Cardiology following. Currently on oral amiodarone and metoprolol 100mg  bid --HR remains tachycardic. Flecainide on hold, plans to resume as outpatient per Cardiology.  Resume flecainide on 02/28/19 per cards -Digoxin started by Cardiology, HR seems improved this AM  Delirium -Post-op delirium expected in 86 yo. -improved, back to baseline at time of d/c  Hypertension -Metoprolol increased to 100mg  bid -BP currently stable  AKI -Cr 1.9 at admission -Normalized with IVF  Sepsis ruled out -Patient presented with SIRS physiologyand elevatedlactate.There was initial concern for sepsis,She was treated with 3 days of meropenem, but this resolved after small bowel resection, and she has had no further signs of infection, and likely her SIRS was from bowel ischemia. -Pt presently hemodynamically stable  Acute blood loss anemia Macrocytic, B12, TSH and folate normal. -some BRBPR noted recently -Serial Hgb reviewed, unremarkable  Other medications -Continueeye drops -Anticipate resuming sertraline at time of discharge   Discharge Instructions   Allergies as of 02/16/2019      Reactions   Shellfish Allergy Hives, Swelling   Shrimp and lobster only   Atropine  Causes AFIB   Codeine    Leg pain   Doxycycline    unknown   Fenoprofen Calcium Swelling   Iodinated Diagnostic Agents Hives, Other (See Comments)   Isopto  Hyoscine [scopolamine]    Causes AFIB   Levaquin [levofloxacin]    Rash & causes AFIB   Other    Other reaction(s): Bleeding (intolerance) Nutrasweet  Naphon - swelling   Oxycodone    Depression - pt tolerates as needed    Penicillins    Has patient had a PCN reaction causing immediate rash, facial/tongue/throat swelling, SOB or lightheadedness with hypotension: unknown Has patient had a PCN reaction causing severe rash involving mucus membranes or skin necrosis: unknown Has patient had a PCN reaction that required hospitalization unknown Has patient had a PCN reaction occurring within the last 10 years: childhood If all of the above answers are "NO", then may proceed with Cephalosporin use. unknown   Sulfa Drugs Cross Reactors    Ankle swelling    Warfarin    Other reaction(s): Bleeding (intolerance) From eyes   Ceclor [cefaclor] Hives   Tape Rash   Use paper tape      Medication List    STOP taking these medications   diltiazem 120 MG 24 hr capsule Commonly known as: CARDIZEM CD     TAKE these medications   acetaminophen 650 MG CR tablet Commonly known as: TYLENOL Take 650 mg by mouth daily as needed for pain.   ARTIFICIAL TEARS OP Place 1 drop into the left eye 2 (two) times daily.   CENTRUM SILVER PO Take 1 capsule by mouth daily.   PRESERVISION/LUTEIN PO Take 1 capsule by mouth 2 (two) times daily.   cetirizine 10 MG tablet Commonly known as: ZYRTEC Take 10 mg by mouth at bedtime.   clobetasol cream 0.05 % Commonly known as: TEMOVATE Apply 1 application topically daily as needed (dermatitis).   digoxin 0.125 MG tablet Commonly known as: LANOXIN Take 1 tablet (0.125 mg total) by mouth daily. Start taking on: February 17, 2019   flecainide 100 MG tablet Commonly known as: TAMBOCOR Take 1 tablet (100 mg total) by mouth 2 (two) times daily. Restart flecainide on 02/28/2019 Start taking on: February 28, 2019 What changed:   additional  instructions  These instructions start on February 28, 2019. If you are unsure what to do until then, ask your doctor or other care provider.   GLUCOSAMINE CHONDR COMPLEX PO Take 1 capsule by mouth daily.   Maxitrol 0.1 % Oint Generic drug: neomycin-polymyxin-dexameth Place 1 application into the right eye at bedtime.   metoprolol tartrate 100 MG tablet Commonly known as: LOPRESSOR Take 1 tablet (100 mg total) by mouth 2 (two) times daily.   polyethylene glycol 17 g packet Commonly known as: MIRALAX / GLYCOLAX Take 17 g by mouth daily as needed for moderate constipation.   sertraline 100 MG tablet Commonly known as: ZOLOFT Take 100 mg by mouth at bedtime.   SYSTANE OP Place 1 drop into the right eye 2 (two) times daily.   Vitamin D 1000 units capsule Take 1,000 Units by mouth at bedtime.   Xarelto 20 MG Tabs tablet Generic drug: rivaroxaban TAKE 1 TABLET ONCE DAILY WITH SUPPER. What changed: See the new instructions.      Follow-up Information    Donato Heinz, MD Follow up.   Specialty: Cardiology Why: Cardiology hospital follow up on 02/24/19 at 9:20. Please arrive 15 minutes early for check in.  Contact information:  [address] [place] [address] [phone]        [name], MD Follow up on 03/01/2019.   Specialty: General Surgery Why: You have an appointment at 9 AM.Be at the office 30 minutes early for check in, bring photo ID and insurance information.You should have someone from family or facility come with you to the appointment. Reschedule if this is not possible.   Contact information: [address] [place] [address] [phone]          Allergies  Allergen Reactions  . Shellfish Allergy Hives and Swelling    Shrimp and lobster only  . Atropine     Causes AFIB  . Codeine     Leg pain  . Doxycycline     unknown  . Fenoprofen Calcium Swelling  . Iodinated Diagnostic Agents Hives and Other  (See Comments)  . Isopto Hyoscine [Scopolamine]     Causes AFIB  . Levaquin [Levofloxacin]     Rash & causes AFIB  . Other     Other reaction(s): Bleeding (intolerance) Nutrasweet  Naphon - swelling  . Oxycodone     Depression - pt tolerates as needed   . Penicillins     Has patient had a PCN reaction causing immediate rash, facial/tongue/throat swelling, SOB or lightheadedness with hypotension: unknown Has patient had a PCN reaction causing severe rash involving mucus membranes or skin necrosis: unknown Has patient had a PCN reaction that required hospitalization unknown Has patient had a PCN reaction occurring within the last 10 years: childhood If all of the above answers are "NO", then may proceed with Cephalosporin use. unknown  . Sulfa Drugs Cross Reactors     Ankle swelling   . Warfarin     Other reaction(s): Bleeding (intolerance) From eyes  . Ceclor [Cefaclor] Hives  . Tape Rash    Use paper tape    Consultations:  General surgery  cardiology   Procedures/Studies: CT Abdomen Pelvis Wo Contrast  Result Date: 02/01/2019 CLINICAL DATA:  86 year old female with nausea and vomiting and abdominal pain. EXAM: CT ABDOMEN AND PELVIS WITHOUT CONTRAST TECHNIQUE: Multidetector CT imaging of the abdomen and pelvis was performed following the standard protocol without IV contrast. COMPARISON:  CT of the abdomen pelvis dated 05/21/2016. FINDINGS: Evaluation of this exam is limited in the absence of intravenous contrast. Lower chest: There is mild cardiomegaly. Partially visualized calcified mitral annulus. Cardiac pacemaker leads noted. The visualized lung bases are clear. No intra-abdominal free air. There is a small ascites. Hepatobiliary: The liver is grossly unremarkable. No intrahepatic biliary ductal dilatation. Minimal sludge may be present within the gallbladder. No calcified stone or pericholecystic fluid. Pancreas: The pancreas is grossly unremarkable. Spleen: Normal in  size without focal abnormality. Adrenals/Urinary Tract: The adrenal glands are unremarkable. There is no hydronephrosis or nephrolithiasis on either side. Multiple bilateral renal hypodense lesions noted which are suboptimally characterized but the larger lesions demonstrate fluid attenuation and present on the prior CT most consistent with cysts. The visualized ureters appear unremarkable. The urinary bladder is minimally distended and grossly unremarkable. Stomach/Bowel: Multiple top-normal caliber and severely inflamed loops of small bowel in the lower abdomen and pelvis measure up to 2.8 cm in diameter. There is severe inflammatory changes of the mesentery associated with this bowel as well as inflammatory changes of the bowel wall. Findings are concerning for a closed loop obstruction with probable point of obstruction in the left hemipelvis (series 2, image 66 and 67 and  coronal series 5, image 63). There is an area of slight mesenteric twisting in the left hemiabdomen (series 5 images 34-53). And underlying omental defect with associated internal hernia is not excluded. Correlation with surgical history recommended. Several foci of air within the inflamed small bowel loops appear to be intraluminal. However, early ischemia is not excluded given the degree of inflammatory changes. Correlation with clinical exam and lactic acid levels recommended. There are scattered colonic diverticula without active inflammatory changes. The appendix is unremarkable as visualized. Vascular/Lymphatic: There is moderate aortoiliac atherosclerotic disease. The IVC is unremarkable. No portal venous gas. There is no adenopathy. Reproductive: The uterus is grossly unremarkable. No adnexal masses. Other: Small fat containing umbilical hernia. Musculoskeletal: Degenerative changes of the spine. No acute osseous pathology. IMPRESSION: 1. Findings concerning for closed loop obstruction in the lower abdomen/pelvis with possible early  ischemia. No definite pneumatosis, portal venous gas, or free air at this time. Correlation with clinical exam, lactic acid levels, and surgical consult is advised. 2. Small ascites. 3. Colonic diverticulosis. 4. Aortic Atherosclerosis (ICD10-I70.0). These results were called by telephone at the time of interpretation on 02/01/2019 at 3:36 pm to physician assistant Alecia Lemming, who verbally acknowledged these results. Electronically Signed   By: Anner Crete M.D.   On: 02/01/2019 15:41   DG Abd 1 View  Result Date: 02/08/2019 CLINICAL DATA:  Abdominal pain, nausea and vomiting. EXAM: ABDOMEN - 1 VIEW COMPARISON:  CT scan 01/22/2019 FINDINGS: Moderate gaseous distention of the stomach and small bowel is demonstrated consistent with a small-bowel obstruction. Some scattered air is noted in the right colon. IMPRESSION: Persistent small-bowel obstruction bowel gas pattern. No definite free air. Electronically Signed   By: Marijo Sanes M.D.   On: 02/08/2019 16:25   DG CHEST PORT 1 VIEW  Result Date: 02/09/2019 CLINICAL DATA:  Status post PICC line insertion. EXAM: PORTABLE CHEST 1 VIEW COMPARISON:  02/01/2019 FINDINGS: There is a left chest wall pacer device with lead in the right atrial appendage and right ventricle. An NG tube is identified with tip below the field of view. Right arm PICC line has been placed the tip terminates over the distal SVC. Heart size appears normal. Small right pleural effusion identified. No airspace opacities. Chronic deformity involving the proximal right humerus noted. Advanced degenerative changes are noted at both glenohumeral joints. IMPRESSION: 1. Satisfactory position of right arm PICC line. 2. Small right pleural effusion. Electronically Signed   By: Kerby Moors M.D.   On: 02/09/2019 11:41   DG Chest Port 1 View  Result Date: 02/01/2019 CLINICAL DATA:  Nausea vomiting diarrhea since last night EXAM: PORTABLE CHEST 1 VIEW COMPARISON:  02/25/2016 FINDINGS:  Cardiomediastinal contours are enlarged. Dual lead pacer device in place. Pack over left chest. Lungs are clear. Postoperative changes over the right breast as before. Visualized skeletal structures without acute finding. IMPRESSION: 1. No active cardiopulmonary disease. 2. Stable enlargement of the cardiomediastinal contours. Electronically Signed   By: Zetta Bills M.D.   On: 02/01/2019 18:30   ECHOCARDIOGRAM COMPLETE  Result Date: 02/08/2019   ECHOCARDIOGRAM REPORT   Patient Name:   KYANA GHIO [facility] Date of Exam: 02/08/2019 Medical Rec #:  [id number]           Height:       64.0 in Accession #:    [id number]          Weight:       142.3 lb Date of Birth:  08/05/1930  BSA:          1.69 m Patient Age:    25 years            BP:           151/84 mmHg Patient Gender: F                   HR:           115 bpm. Exam Location:  Inpatient Procedure: 2D Echo, Cardiac Doppler and Color Doppler Indications:    Atrial Fibrillation 427.31  History:        Patient has prior history of Echocardiogram examinations, most                 recent 01/08/2015. Pacemaker, Arrythmias:Atrial Fibrillation,                 Signs/Symptoms:Shortness of Breath; Risk Factors:Hypertension,                 Diabetes and Non-Smoker. Sepsis.  Sonographer:    Paulita Fujita RDCS Referring Phys: [place]  1. Left ventricular ejection fraction, by visual estimation, is 50 to 55%. The left ventricle has low normal function. Left ventricular septal wall thickness was normal. Normal left ventricular posterior wall thickness. There is no left ventricular hypertrophy.  2. Left ventricular diastolic parameters are indeterminate.  3. The left ventricle has no regional wall motion abnormalities.  4. Global right ventricle has normal systolic function.The right ventricular size is normal. No increase in right ventricular wall thickness.  5. Left atrial size was normal.  6. Right atrial size was normal.  7. The  mitral valve is normal in structure. Trivial mitral valve regurgitation. No evidence of mitral stenosis.  8. The tricuspid valve is normal in structure.  9. The aortic valve is tricuspid. Aortic valve regurgitation is not visualized. No evidence of aortic valve sclerosis or stenosis. 10. The pulmonic valve was normal in structure. Pulmonic valve regurgitation is not visualized. 11. Mildly elevated pulmonary artery systolic pressure. 12. A pacer wire is visualized. 13. The inferior vena cava is normal in size with greater than 50% respiratory variability, suggesting right atrial pressure of 3 mmHg. FINDINGS  Left Ventricle: Left ventricular ejection fraction, by visual estimation, is 50 to 55%. The left ventricle has low normal function. The left ventricle has no regional wall motion abnormalities. The left ventricular internal cavity size was the left ventricle is normal in size. Normal left ventricular posterior wall thickness. There is no left ventricular hypertrophy. Left ventricular diastolic parameters are indeterminate. Normal left atrial pressure. Right Ventricle: The right ventricular size is normal. No increase in right ventricular wall thickness. Global RV systolic function is has normal systolic function. The tricuspid regurgitant velocity is 2.73 m/s, and with an assumed right atrial pressure  of 3 mmHg, the estimated right ventricular systolic pressure is mildly elevated at 32.8 mmHg. Left Atrium: Left atrial size was normal in size. Right Atrium: Right atrial size was normal in size Pericardium: There is no evidence of pericardial effusion. Mitral Valve: The mitral valve is normal in structure. Trivial mitral valve regurgitation. No evidence of mitral valve stenosis by observation. Tricuspid Valve: The tricuspid valve is normal in structure. Tricuspid valve regurgitation is mild. Aortic Valve: The aortic valve is tricuspid. . There is mild thickening and mild calcification of the aortic valve. Aortic  valve regurgitation is not visualized. The aortic valve is structurally normal, with no evidence of sclerosis  or stenosis. There is  mild thickening of the aortic valve. There is mild calcification of the aortic valve. Pulmonic Valve: The pulmonic valve was normal in structure. Pulmonic valve regurgitation is not visualized. Pulmonic regurgitation is not visualized. Aorta: The aortic root, ascending aorta and aortic arch are all structurally normal, with no evidence of dilitation or obstruction. Venous: The inferior vena cava is normal in size with greater than 50% respiratory variability, suggesting right atrial pressure of 3 mmHg. IAS/Shunts: No atrial level shunt detected by color flow Doppler. There is no evidence of a patent foramen ovale. No ventricular septal defect is seen or detected. There is no evidence of an atrial septal defect. Additional Comments: A pacer wire is visualized.  LEFT VENTRICLE PLAX 2D LVIDd:         3.70 cm LVIDs:         3.00 cm LV PW:         0.80 cm LV IVS:        0.80 cm LVOT diam:     1.80 cm LV SV:         23 ml LV SV Index:   13.47 LVOT Area:     2.54 cm  LV Volumes (MOD) LV area d, A2C:    21.60 cm LV area d, A4C:    23.90 cm LV area s, A2C:    14.70 cm LV area s, A4C:    17.30 cm LV major d, A2C:   6.14 cm LV major d, A4C:   7.28 cm LV major s, A2C:   5.04 cm LV major s, A4C:   6.30 cm LV vol d, MOD A2C: 63.0 ml LV vol d, MOD A4C: 66.5 ml LV vol s, MOD A2C: 35.6 ml LV vol s, MOD A4C: 40.6 ml LV SV MOD A2C:     27.4 ml LV SV MOD A4C:     66.5 ml LV SV MOD BP:      27.8 ml RIGHT VENTRICLE TAPSE (M-mode): 2.0 cm LEFT ATRIUM             Index       RIGHT ATRIUM           Index LA diam:        4.60 cm 2.72 cm/m  RA Area:     12.30 cm LA Vol (A2C):   47.0 ml 27.76 ml/m RA Volume:   26.80 ml  15.83 ml/m LA Vol (A4C):   35.4 ml 20.91 ml/m LA Biplane Vol: 41.1 ml 24.28 ml/m  AORTIC VALVE LVOT Vmax:   97.50 cm/s LVOT Vmean:  63.600 cm/s LVOT VTI:    0.164 m  AORTA Ao Root diam:  3.30 cm TRICUSPID VALVE TR Peak grad:   29.8 mmHg TR Vmax:        273.00 cm/s  SHUNTS Systemic VTI:  0.16 m Systemic Diam: 1.80 cm  Skeet Latch MD Electronically signed by Skeet Latch MD Signature Date/Time: 02/08/2019/3:48:15 PM    Final    [place] EKG SITE RITE  Result Date: 02/09/2019 If Site Rite image not attached, placement could not be confirmed due to current cardiac rhythm.        Discharge Exam: Vitals:   02/15/19 1952 02/16/19 1000  BP: (!) 88/64 (!) 89/67  Pulse: 98 88  Resp: 20 16  Temp: 97.9 F (36.6 C)   SpO2: 99% 100%   Vitals:   02/15/19 1600 02/15/19 1746 02/15/19 1952 02/16/19 1000  BP:  100/85 (!) 88/64 [name] Kitchen)  89/67  Pulse:  81 98 88  Resp:  18 20 16   Temp: 97.9 F (36.6 C) 97.6 F (36.4 C) 97.9 F (36.6 C)   TempSrc: Oral Oral    SpO2:  100% 99% 100%  Weight:      Height:        General: Pt is alert, awake, not in acute distress Cardiovascular: RRR, S1/S2 +, no rubs, no gallops Respiratory: CTA bilaterally, no wheezing, no rhonchi Abdominal: Soft, NT, ND, bowel sounds + Extremities: no edema, no cyanosis   The results of significant diagnostics from this hospitalization (including imaging, microbiology, ancillary and laboratory) are listed below for reference.    Significant Diagnostic Studies: CT Abdomen Pelvis Wo Contrast  Result Date: 02/01/2019 CLINICAL DATA:  86 year old female with nausea and vomiting and abdominal pain. EXAM: CT ABDOMEN AND PELVIS WITHOUT CONTRAST TECHNIQUE: Multidetector CT imaging of the abdomen and pelvis was performed following the standard protocol without IV contrast. COMPARISON:  CT of the abdomen pelvis dated 05/21/2016. FINDINGS: Evaluation of this exam is limited in the absence of intravenous contrast. Lower chest: There is mild cardiomegaly. Partially visualized calcified mitral annulus. Cardiac pacemaker leads noted. The visualized lung bases are clear. No intra-abdominal free air. There is a small ascites.  Hepatobiliary: The liver is grossly unremarkable. No intrahepatic biliary ductal dilatation. Minimal sludge may be present within the gallbladder. No calcified stone or pericholecystic fluid. Pancreas: The pancreas is grossly unremarkable. Spleen: Normal in size without focal abnormality. Adrenals/Urinary Tract: The adrenal glands are unremarkable. There is no hydronephrosis or nephrolithiasis on either side. Multiple bilateral renal hypodense lesions noted which are suboptimally characterized but the larger lesions demonstrate fluid attenuation and present on the prior CT most consistent with cysts. The visualized ureters appear unremarkable. The urinary bladder is minimally distended and grossly unremarkable. Stomach/Bowel: Multiple top-normal caliber and severely inflamed loops of small bowel in the lower abdomen and pelvis measure up to 2.8 cm in diameter. There is severe inflammatory changes of the mesentery associated with this bowel as well as inflammatory changes of the bowel wall. Findings are concerning for a closed loop obstruction with probable point of obstruction in the left hemipelvis (series 2, image 66 and 67 and coronal series 5, image 63). There is an area of slight mesenteric twisting in the left hemiabdomen (series 5 images 34-53). And underlying omental defect with associated internal hernia is not excluded. Correlation with surgical history recommended. Several foci of air within the inflamed small bowel loops appear to be intraluminal. However, early ischemia is not excluded given the degree of inflammatory changes. Correlation with clinical exam and lactic acid levels recommended. There are scattered colonic diverticula without active inflammatory changes. The appendix is unremarkable as visualized. Vascular/Lymphatic: There is moderate aortoiliac atherosclerotic disease. The IVC is unremarkable. No portal venous gas. There is no adenopathy. Reproductive: The uterus is grossly unremarkable.  No adnexal masses. Other: Small fat containing umbilical hernia. Musculoskeletal: Degenerative changes of the spine. No acute osseous pathology. IMPRESSION: 1. Findings concerning for closed loop obstruction in the lower abdomen/pelvis with possible early ischemia. No definite pneumatosis, portal venous gas, or free air at this time. Correlation with clinical exam, lactic acid levels, and surgical consult is advised. 2. Small ascites. 3. Colonic diverticulosis. 4. Aortic Atherosclerosis (ICD10-I70.0). These results were called by telephone at the time of interpretation on 02/01/2019 at 3:36 pm to physician assistant Alecia Lemming, who verbally acknowledged these results. Electronically Signed   By: Laren Everts.D.  On: 02/01/2019 15:41   DG Abd 1 View  Result Date: 02/08/2019 CLINICAL DATA:  Abdominal pain, nausea and vomiting. EXAM: ABDOMEN - 1 VIEW COMPARISON:  CT scan 01/22/2019 FINDINGS: Moderate gaseous distention of the stomach and small bowel is demonstrated consistent with a small-bowel obstruction. Some scattered air is noted in the right colon. IMPRESSION: Persistent small-bowel obstruction bowel gas pattern. No definite free air. Electronically Signed   By: Marijo Sanes M.D.   On: 02/08/2019 16:25   DG CHEST PORT 1 VIEW  Result Date: 02/09/2019 CLINICAL DATA:  Status post PICC line insertion. EXAM: PORTABLE CHEST 1 VIEW COMPARISON:  02/01/2019 FINDINGS: There is a left chest wall pacer device with lead in the right atrial appendage and right ventricle. An NG tube is identified with tip below the field of view. Right arm PICC line has been placed the tip terminates over the distal SVC. Heart size appears normal. Small right pleural effusion identified. No airspace opacities. Chronic deformity involving the proximal right humerus noted. Advanced degenerative changes are noted at both glenohumeral joints. IMPRESSION: 1. Satisfactory position of right arm PICC line. 2. Small right pleural  effusion. Electronically Signed   By: Kerby Moors M.D.   On: 02/09/2019 11:41   DG Chest Port 1 View  Result Date: 02/01/2019 CLINICAL DATA:  Nausea vomiting diarrhea since last night EXAM: PORTABLE CHEST 1 VIEW COMPARISON:  02/25/2016 FINDINGS: Cardiomediastinal contours are enlarged. Dual lead pacer device in place. Pack over left chest. Lungs are clear. Postoperative changes over the right breast as before. Visualized skeletal structures without acute finding. IMPRESSION: 1. No active cardiopulmonary disease. 2. Stable enlargement of the cardiomediastinal contours. Electronically Signed   By: Zetta Bills M.D.   On: 02/01/2019 18:30   ECHOCARDIOGRAM COMPLETE  Result Date: 02/08/2019   ECHOCARDIOGRAM REPORT   Patient Name:   KEZARIA JURKOVICH [facility] Date of Exam: 02/08/2019 Medical Rec #:  [id number]           Height:       64.0 in Accession #:    [id number]          Weight:       142.3 lb Date of Birth:  December 14, 1930          BSA:          1.69 m Patient Age:    71 years            BP:           151/84 mmHg Patient Gender: F                   HR:           115 bpm. Exam Location:  Inpatient Procedure: 2D Echo, Cardiac Doppler and Color Doppler Indications:    Atrial Fibrillation 427.31  History:        Patient has prior history of Echocardiogram examinations, most                 recent 01/08/2015. Pacemaker, Arrythmias:Atrial Fibrillation,                 Signs/Symptoms:Shortness of Breath; Risk Factors:Hypertension,                 Diabetes and Non-Smoker. Sepsis.  Sonographer:    Paulita Fujita RDCS Referring Phys: [place]  1. Left ventricular ejection fraction, by visual estimation, is 50 to 55%. The left ventricle has low normal function. Left ventricular septal wall  thickness was normal. Normal left ventricular posterior wall thickness. There is no left ventricular hypertrophy.  2. Left ventricular diastolic parameters are indeterminate.  3. The left ventricle has no  regional wall motion abnormalities.  4. Global right ventricle has normal systolic function.The right ventricular size is normal. No increase in right ventricular wall thickness.  5. Left atrial size was normal.  6. Right atrial size was normal.  7. The mitral valve is normal in structure. Trivial mitral valve regurgitation. No evidence of mitral stenosis.  8. The tricuspid valve is normal in structure.  9. The aortic valve is tricuspid. Aortic valve regurgitation is not visualized. No evidence of aortic valve sclerosis or stenosis. 10. The pulmonic valve was normal in structure. Pulmonic valve regurgitation is not visualized. 11. Mildly elevated pulmonary artery systolic pressure. 12. A pacer wire is visualized. 13. The inferior vena cava is normal in size with greater than 50% respiratory variability, suggesting right atrial pressure of 3 mmHg. FINDINGS  Left Ventricle: Left ventricular ejection fraction, by visual estimation, is 50 to 55%. The left ventricle has low normal function. The left ventricle has no regional wall motion abnormalities. The left ventricular internal cavity size was the left ventricle is normal in size. Normal left ventricular posterior wall thickness. There is no left ventricular hypertrophy. Left ventricular diastolic parameters are indeterminate. Normal left atrial pressure. Right Ventricle: The right ventricular size is normal. No increase in right ventricular wall thickness. Global RV systolic function is has normal systolic function. The tricuspid regurgitant velocity is 2.73 m/s, and with an assumed right atrial pressure  of 3 mmHg, the estimated right ventricular systolic pressure is mildly elevated at 32.8 mmHg. Left Atrium: Left atrial size was normal in size. Right Atrium: Right atrial size was normal in size Pericardium: There is no evidence of pericardial effusion. Mitral Valve: The mitral valve is normal in structure. Trivial mitral valve regurgitation. No evidence of mitral  valve stenosis by observation. Tricuspid Valve: The tricuspid valve is normal in structure. Tricuspid valve regurgitation is mild. Aortic Valve: The aortic valve is tricuspid. . There is mild thickening and mild calcification of the aortic valve. Aortic valve regurgitation is not visualized. The aortic valve is structurally normal, with no evidence of sclerosis or stenosis. There is  mild thickening of the aortic valve. There is mild calcification of the aortic valve. Pulmonic Valve: The pulmonic valve was normal in structure. Pulmonic valve regurgitation is not visualized. Pulmonic regurgitation is not visualized. Aorta: The aortic root, ascending aorta and aortic arch are all structurally normal, with no evidence of dilitation or obstruction. Venous: The inferior vena cava is normal in size with greater than 50% respiratory variability, suggesting right atrial pressure of 3 mmHg. IAS/Shunts: No atrial level shunt detected by color flow Doppler. There is no evidence of a patent foramen ovale. No ventricular septal defect is seen or detected. There is no evidence of an atrial septal defect. Additional Comments: A pacer wire is visualized.  LEFT VENTRICLE PLAX 2D LVIDd:         3.70 cm LVIDs:         3.00 cm LV PW:         0.80 cm LV IVS:        0.80 cm LVOT diam:     1.80 cm LV SV:         23 ml LV SV Index:   13.47 LVOT Area:     2.54 cm  LV Volumes (MOD) LV area  d, A2C:    21.60 cm LV area d, A4C:    23.90 cm LV area s, A2C:    14.70 cm LV area s, A4C:    17.30 cm LV major d, A2C:   6.14 cm LV major d, A4C:   7.28 cm LV major s, A2C:   5.04 cm LV major s, A4C:   6.30 cm LV vol d, MOD A2C: 63.0 ml LV vol d, MOD A4C: 66.5 ml LV vol s, MOD A2C: 35.6 ml LV vol s, MOD A4C: 40.6 ml LV SV MOD A2C:     27.4 ml LV SV MOD A4C:     66.5 ml LV SV MOD BP:      27.8 ml RIGHT VENTRICLE TAPSE (M-mode): 2.0 cm LEFT ATRIUM             Index       RIGHT ATRIUM           Index LA diam:        4.60 cm 2.72 cm/m  RA Area:      12.30 cm LA Vol (A2C):   47.0 ml 27.76 ml/m RA Volume:   26.80 ml  15.83 ml/m LA Vol (A4C):   35.4 ml 20.91 ml/m LA Biplane Vol: 41.1 ml 24.28 ml/m  AORTIC VALVE LVOT Vmax:   97.50 cm/s LVOT Vmean:  63.600 cm/s LVOT VTI:    0.164 m  AORTA Ao Root diam: 3.30 cm TRICUSPID VALVE TR Peak grad:   29.8 mmHg TR Vmax:        273.00 cm/s  SHUNTS Systemic VTI:  0.16 m Systemic Diam: 1.80 cm  Skeet Latch MD Electronically signed by Skeet Latch MD Signature Date/Time: 02/08/2019/3:48:15 PM    Final    [place] EKG SITE RITE  Result Date: 02/09/2019 If Site Rite image not attached, placement could not be confirmed due to current cardiac rhythm.    Microbiology: No results found for this or any previous visit (from the past 240 hour(s)).   Labs: Basic Metabolic Panel: Recent Labs  Lab 02/10/19 0805 02/11/19 0500 02/12/19 0430 02/14/19 0326 02/15/19 0144  NA 145 142 138 141  --   K 3.1* 3.6 3.6 3.5  --   CL 107 110 107 109  --   CO2 27 27 24 24   --   GLUCOSE 159* 155* 166* 91  --   BUN 26* 21 18 16   --   CREATININE 0.57 0.52 0.52 0.62 0.63  CALCIUM 8.1* 8.0* 7.9* 8.3*  --   MG 2.1 1.8  --  2.0  --   PHOS 2.0* 2.2* 3.0 3.3  --    Liver Function Tests: Recent Labs  Lab 02/10/19 0805 02/11/19 0500 02/14/19 0326  AST 19 22 17   ALT 21 22 20   ALKPHOS 71 70 97  BILITOT 1.4* 1.0 1.5*  PROT 4.8* 4.5* 5.1*  ALBUMIN 2.6* 2.2* 2.7*   No results for input(s): LIPASE, AMYLASE in the last 168 hours. No results for input(s): AMMONIA in the last 168 hours. CBC: Recent Labs  Lab 02/10/19 0805 02/11/19 0500 02/11/19 1530 02/12/19 1258 02/13/19 0841 02/14/19 0326  WBC 8.6 9.6 13.8* 12.6* 11.6* 8.9  NEUTROABS 6.6  --   --   --   --  6.1  HGB 8.3* 7.3* 8.1* 7.6* 9.0* 8.2*  HCT 26.6* 22.8* 25.2* 23.8* 27.8* 25.7*  MCV 114.7* 114.0* 114.0* 113.9* 112.6* 111.3*  PLT 227 221 266 243 246 279   Cardiac Enzymes: No  results for input(s): CKTOTAL, CKMB, CKMBINDEX, TROPONINI in the last 168  hours. BNP: Invalid input(s): POCBNP CBG: Recent Labs  Lab 02/12/19 1932 02/12/19 2310 02/13/19 0331 02/13/19 0729 02/13/19 1127  GLUCAP 128* 116* 99 96 120*    Time coordinating discharge:  36 minutes  Signed:  Orson Eva, DO [facility] Pager: [phone] 02/16/2019, 11:21 AM

## 2019-02-16 NOTE — Telephone Encounter (Signed)
Still admitted 02/01/2019 - present (15 days) [facility]

## 2019-02-16 NOTE — Discharge Instructions (Signed)
SURGERY: POST OP INSTRUCTIONS (Surgery for small bowel obstruction, colon resection, etc)   ######################################################################  EAT Gradually transition to a high fiber diet with a fiber supplement over the next few days after discharge  WALK Walk an hour a day.  Control your pain to do that.    CONTROL PAIN Control pain so that you can walk, sleep, tolerate sneezing/coughing, go up/down stairs.  HAVE A BOWEL MOVEMENT DAILY Keep your bowels regular to avoid problems.  OK to try a laxative to override constipation.  OK to use an antidairrheal to slow down diarrhea.  Call if not better after 2 tries  CALL IF YOU HAVE PROBLEMS/CONCERNS Call if you are still struggling despite following these instructions. Call if you have concerns not answered by these instructions  ######################################################################   DIET Follow a light diet the first few days at home.  Start with a bland diet such as soups, liquids, starchy foods, low fat foods, etc.  If you feel full, bloated, or constipated, stay on a ful liquid or pureed/blenderized diet for a few days until you feel better and no longer constipated. Be sure to drink plenty of fluids every day to avoid getting dehydrated (feeling dizzy, not urinating, etc.). Gradually add a fiber supplement to your diet over the next week.  Gradually get back to a regular solid diet.  Avoid fast food or heavy meals the first week as you are more likely to get nauseated. It is expected for your digestive tract to need a few months to get back to normal.  It is common for your bowel movements and stools to be irregular.  You will have occasional bloating and cramping that should eventually fade away.  Until you are eating solid food normally, off all pain medications, and back to regular activities; your bowels will not be normal. Focus on eating a low-fat, high fiber diet the rest of your life  (See Getting to Good Bowel Health, below).  CARE of your INCISION or WOUND It is good for closed incision and even open wounds to be washed every day.  Shower every day.  Short baths are fine.  Wash the incisions and wounds clean with soap & water.    If you have a closed incision(s), wash the incision with soap & water every day.  You may leave closed incisions open to air if it is dry.   You may cover the incision with clean gauze & replace it after your daily shower for comfort. If you have skin tapes (Steristrips) or skin glue (Dermabond) on your incision, leave them in place.  They will fall off on their own like a scab.  You may trim any edges that curl up with clean scissors.  If you have staples, set up an appointment for them to be removed in the office in 10 days after surgery.  If you have a drain, wash around the skin exit site with soap & water and place a new dressing of gauze or band aid around the skin every day.  Keep the drain site clean & dry.    If you have an open wound with packing, see wound care instructions.  In general, it is encouraged that you remove your dressing and packing, shower with soap & water, and replace your dressing once a day.  Pack the wound with clean gauze moistened with normal (0.9%) saline to keep the wound moist & uninfected.  Pressure on the dressing for 30 minutes will stop most wound   bleeding.  Eventually your body will heal & pull the open wound closed over the next few months.  Raw open wounds will occasionally bleed or secrete yellow drainage until it heals closed.  Drain sites will drain a little until the drain is removed.  Even closed incisions can have mild bleeding or drainage the first few days until the skin edges scab over & seal.   If you have an open wound with a wound vac, see wound vac care instructions.     ACTIVITIES as tolerated Start light daily activities --- self-care, walking, climbing stairs-- beginning the day after surgery.   Gradually increase activities as tolerated.  Control your pain to be active.  Stop when you are tired.  Ideally, walk several times a day, eventually an hour a day.   Most people are back to most day-to-day activities in a few weeks.  It takes 4-8 weeks to get back to unrestricted, intense activity. If you can walk 30 minutes without difficulty, it is safe to try more intense activity such as jogging, treadmill, bicycling, low-impact aerobics, swimming, etc. Save the most intensive and strenuous activity for last (Usually 4-8 weeks after surgery) such as sit-ups, heavy lifting, contact sports, etc.  Refrain from any intense heavy lifting or straining until you are off narcotics for pain control.  You will have off days, but things should improve week-by-week. DO NOT PUSH THROUGH PAIN.  Let pain be your guide: If it hurts to do something, don't do it.  Pain is your body warning you to avoid that activity for another week until the pain goes down. You may drive when you are no longer taking narcotic prescription pain medication, you can comfortably wear a seatbelt, and you can safely make sudden turns/stops to protect yourself without hesitating due to pain. You may have sexual intercourse when it is comfortable. If it hurts to do something, stop.  MEDICATIONS Take your usually prescribed home medications unless otherwise directed.   Blood thinners:  Usually you can restart any strong blood thinners after the second postoperative day.  It is OK to take aspirin right away.     If you are on strong blood thinners (warfarin/Coumadin, Plavix, Xerelto, Eliquis, Pradaxa, etc), discuss with your surgeon, medicine PCP, and/or cardiologist for instructions on when to restart the blood thinner & if blood monitoring is needed (PT/INR blood check, etc).     PAIN CONTROL Pain after surgery or related to activity is often due to strain/injury to muscle, tendon, nerves and/or incisions.  This pain is usually  short-term and will improve in a few months.  To help speed the process of healing and to get back to regular activity more quickly, DO THE FOLLOWING THINGS TOGETHER: 1. Increase activity gradually.  DO NOT PUSH THROUGH PAIN 2. Use Ice and/or Heat 3. Try Gentle Massage and/or Stretching 4. Take over the counter pain medication 5. Take Narcotic prescription pain medication for more severe pain  Good pain control = faster recovery.  It is better to take more medicine to be more active than to stay in bed all day to avoid medications. 1.  Increase activity gradually Avoid heavy lifting at first, then increase to lifting as tolerated over the next 6 weeks. Do not "push through" the pain.  Listen to your body and avoid positions and maneuvers than reproduce the pain.  Wait a few days before trying something more intense Walking an hour a day is encouraged to help your body recover faster   and more safely.  Start slowly and stop when getting sore.  If you can walk 30 minutes without stopping or pain, you can try more intense activity (running, jogging, aerobics, cycling, swimming, treadmill, sex, sports, weightlifting, etc.) Remember: If it hurts to do it, then don't do it! 2. Use Ice and/or Heat You will have swelling and bruising around the incisions.  This will take several weeks to resolve. Ice packs or heating pads (6-8 times a day, 30-60 minutes at a time) will help sooth soreness & bruising. Some people prefer to use ice alone, heat alone, or alternate between ice & heat.  Experiment and see what works best for you.  Consider trying ice for the first few days to help decrease swelling and bruising; then, switch to heat to help relax sore spots and speed recovery. Shower every day.  Short baths are fine.  It feels good!  Keep the incisions and wounds clean with soap & water.   3. Try Gentle Massage and/or Stretching Massage at the area of pain many times a day Stop if you feel pain - do not  overdo it 4. Take over the counter pain medication This helps the muscle and nerve tissues become less irritable and calm down faster Choose ONE of the following over-the-counter anti-inflammatory medications: Acetaminophen 500mg tabs (Tylenol) 1-2 pills with every meal and just before bedtime (avoid if you have liver problems or if you have acetaminophen in you narcotic prescription) Naproxen 220mg tabs (ex. Aleve, Naprosyn) 1-2 pills twice a day (avoid if you have kidney, stomach, IBD, or bleeding problems) Ibuprofen 200mg tabs (ex. Advil, Motrin) 3-4 pills with every meal and just before bedtime (avoid if you have kidney, stomach, IBD, or bleeding problems) Take with food/snack several times a day as directed for at least 2 weeks to help keep pain / soreness down & more manageable. 5. Take Narcotic prescription pain medication for more severe pain A prescription for strong pain control is often given to you upon discharge (for example: oxycodone/Percocet, hydrocodone/Norco/Vicodin, or tramadol/Ultram) Take your pain medication as prescribed. Be mindful that most narcotic prescriptions contain Tylenol (acetaminophen) as well - avoid taking too much Tylenol. If you are having problems/concerns with the prescription medicine (does not control pain, nausea, vomiting, rash, itching, etc.), please call us [phone] to see if we need to switch you to a different pain medicine that will work better for you and/or control your side effects better. If you need a refill on your pain medication, you must call the office before 4 pm and on weekdays only.  By federal law, prescriptions for narcotics cannot be called into a pharmacy.  They must be filled out on paper & picked up from our office by the patient or authorized caretaker.  Prescriptions cannot be filled after 4 pm nor on weekends.    WHEN TO CALL US [phone] Severe uncontrolled or worsening pain  Fever over 101 F (38.5 C) Concerns with  the incision: Worsening pain, redness, rash/hives, swelling, bleeding, or drainage Reactions / problems with new medications (itching, rash, hives, nausea, etc.) Nausea and/or vomiting Difficulty urinating Difficulty breathing Worsening fatigue, dizziness, lightheadedness, blurred vision Other concerns If you are not getting better after two weeks or are noticing you are getting worse, contact our office [phone] for further advice.  We may need to adjust your medications, re-evaluate you in the office, send you to the emergency room, or see what other things we can do to help. The   clinic staff is available to answer your questions during regular business hours (8:30am-5pm).  Please don't hesitate to call and ask to speak to one of our nurses for clinical concerns.    A surgeon from [facility] is always on call at the hospitals 24 hours/day If you have a medical emergency, go to the nearest emergency room or call 911.  FOLLOW UP in our office One the day of your discharge from the hospital (or the next business weekday), please call [facility] to set up or confirm an appointment to see your surgeon in the office for a follow-up appointment.  Usually it is 2-3 weeks after your surgery.   If you have skin staples at your incision(s), let the office know so we can set up a time in the office for the nurse to remove them (usually around 10 days after surgery). Make sure that you call for appointments the day of discharge (or the next business weekday) from the hospital to ensure a convenient appointment time. IF YOU HAVE DISABILITY OR FAMILY LEAVE FORMS, BRING THEM TO THE OFFICE FOR PROCESSING.  DO NOT GIVE THEM TO YOUR DOCTOR.  [facility] [address], [address], , [place]  [address] ? [phone] - Main [phone] - Toll Free,  [phone] - Fax [email or web address]  GETTING TO GOOD BOWEL HEALTH. It is  expected for your digestive tract to need a few months to get back to normal.  It is common for your bowel movements and stools to be irregular.  You will have occasional bloating and cramping that should eventually fade away.  Until you are eating solid food normally, off all pain medications, and back to regular activities; your bowels will not be normal.   Avoiding constipation The goal: ONE SOFT BOWEL MOVEMENT A DAY!    Drink plenty of fluids.  Choose water first. TAKE A FIBER SUPPLEMENT EVERY DAY THE REST OF YOUR LIFE During your first week back home, gradually add back a fiber supplement every day Experiment which form you can tolerate.   There are many forms such as powders, tablets, wafers, gummies, etc Psyllium bran (Metamucil), methylcellulose (Citrucel), Miralax or Glycolax, Benefiber, Flax Seed.  Adjust the dose week-by-week (1/2 dose/day to 6 doses a day) until you are moving your bowels 1-2 times a day.  Cut back the dose or try a different fiber product if it is giving you problems such as diarrhea or bloating. Sometimes a laxative is needed to help jump-start bowels if constipated until the fiber supplement can help regulate your bowels.  If you are tolerating eating & you are farting, it is okay to try a gentle laxative such as double dose MiraLax, prune juice, or Milk of Magnesia.  Avoid using laxatives too often. Stool softeners can sometimes help counteract the constipating effects of narcotic pain medicines.  It can also cause diarrhea, so avoid using for too long. If you are still constipated despite taking fiber daily, eating solids, and a few doses of laxatives, call our office. Controlling diarrhea Try drinking liquids and eating bland foods for a few days to avoid stressing your intestines further. Avoid dairy products (especially milk & ice cream) for a short time.  The intestines often can lose the ability to digest lactose when stressed. Avoid foods that cause gassiness or  bloating.  Typical foods include beans and other legumes, cabbage, broccoli, and dairy foods.  Avoid greasy, spicy, fast foods.  Every person has   some sensitivity to other foods, so listen to your body and avoid those foods that trigger problems for you. Probiotics (such as active yogurt, Align, etc) may help repopulate the intestines and colon with normal bacteria and calm down a sensitive digestive tract Adding a fiber supplement gradually can help thicken stools by absorbing excess fluid and retrain the intestines to act more normally.  Slowly increase the dose over a few weeks.  Too much fiber too soon can backfire and cause cramping & bloating. It is okay to try and slow down diarrhea with a few doses of antidiarrheal medicines.   Bismuth subsalicylate (ex. Kayopectate, Pepto Bismol) for a few doses can help control diarrhea.  Avoid if pregnant.   Loperamide (Imodium) can slow down diarrhea.  Start with one tablet (2mg ) first.  Avoid if you are having fevers or severe pain.  ILEOSTOMY PATIENTS WILL HAVE CHRONIC DIARRHEA since their colon is not in use.    Drink plenty of liquids.  You will need to drink even more glasses of water/liquid a day to avoid getting dehydrated. Record output from your ileostomy.  Expect to empty the bag every 3-4 hours at first.  Most people with a permanent ileostomy empty their bag 4-6 times at the least.   Use antidiarrheal medicine (especially Imodium) several times a day to avoid getting dehydrated.  Start with a dose at bedtime & breakfast.  Adjust up or down as needed.  Increase antidiarrheal medications as directed to avoid emptying the bag more than 8 times a day (every 3 hours). Work with your wound ostomy nurse to learn care for your ostomy.  See ostomy care instructions. TROUBLESHOOTING IRREGULAR BOWELS 1) Start with a soft & bland diet. No spicy, greasy, or fried foods.  2) Avoid gluten/wheat or dairy products from diet to see if symptoms improve. 3) Miralax  17gm or flax seed mixed in [place]. water or juice-daily. May use 2-4 times a day as needed. 4) Gas-X, Phazyme, etc. as needed for gas & bloating.  5) Prilosec (omeprazole) over-the-counter as needed 6)  Consider probiotics (Align, Activa, etc) to help calm the bowels down  Call your doctor if you are getting worse or not getting better.  Sometimes further testing (cultures, endoscopy, X-ray studies, CT scans, bloodwork, etc.) may be needed to help diagnose and treat the cause of the diarrhea. [facility], [place], [place], [place], [place]  [address] [phone] - Main.    [phone]  - Toll Free.   [phone] - Fax [email or web address]   [facility] Stay Proper nutrition can help your body recover from illness and injury.   Foods and beverages high in protein, vitamins, and minerals help rebuild muscle loss, promote healing, & reduce fall risk.   .In addition to eating healthy foods, a nutrition shake is an easy, delicious way to get the nutrition you need during and after your hospital stay  It is recommended that you continue to drink 2 bottles per day of:       Ensure or Boost for at least 1 month (30 days) after your hospital stay   Tips for adding a nutrition shake into your routine: As allowed, drink one with vitamins or medications instead of water or juice Enjoy one as a tasty mid-morning or afternoon snack Drink cold or make a milkshake out of it Drink one instead of milk with cereal or snacks Use as a coffee creamer   Available at the following grocery  stores and pharmacies:           * [place] [phone]            For COUPONS visit: www.ensure.com/join or [email or web address]-[email or web address]   Suggested Substitutions Ensure Plus = Boost Plus =  [place] Breakfast Essentials = Boost Compact Ensure Active Clear = Boost Breeze Glucerna Shake = Boost Glucose Control = [place] Breakfast Essentials SUGAR FREE

## 2019-02-16 NOTE — Progress Notes (Signed)
Went over discharge instructions w/ pt. She verbalized understanding.

## 2019-02-16 NOTE — Progress Notes (Signed)
Nutrition Follow-up  DOCUMENTATION CODES:   Non-severe (moderate) malnutrition in context of chronic illness  INTERVENTION:   -Ensure Enlive po BID, each supplement provides 350 kcal and 20 grams of protein  NUTRITION DIAGNOSIS:   Moderate Malnutrition related to chronic illness as evidenced by mild fat depletion, mild muscle depletion.  Ongoing.  GOAL:   Patient will meet greater than or equal to 90% of their needs  Progressing.  MONITOR:   Diet advancement, Labs, Weight trends, I & O's, Other (Comment)(TPN regimen)  ASSESSMENT:   86 y.o. female with A.fib, SSS with pacer on Xarelto, HTN, history DCIS, and enucleation of the R eye. She presented to the ED on 12/22 with 1 day of abdominal pain, N/V. In the ED she was hypotensive, found to have AKI, and CT abdomen/pelvis showed closed-loop obstruction with possible early ischemia. She underwent emergent surgical intervention with LOA, small bowel resection, and wound vac placement.  Significant Events: 12/22- admission; NGT placement; laparoscopy with LOA, small bowel resection, primary umbilical hernia repair, wound vac placement 12/26- NGT removed 12/27- diet advanced from NPO to CLD 12/29- NGT replacement; changed back to NPO 1/1: NGT removed 1/2: CLD 1/3: FLD, TPN weaned off 1/5: Regular diet  Patient now on regular diet as of 1/5. Pt is tolerating with her diet without N/V. Pt is drinking Ensure supplements now for additional kcals and protein.  Admission weight: 135 lbs. Current weight: 139 lbs.  I/Os: +10.1L since 12/23.  UOP: 450 ml x 24 hrs  Labs reviewed. Medications reviewed.  Diet Order:   Diet Order            Diet regular Room service appropriate? Yes; Fluid consistency: Thin  Diet effective now              EDUCATION NEEDS:   No education needs have been identified at this time  Skin:  Skin Assessment: Skin Integrity Issues: Skin Integrity Issues:: Incisions Incisions: abdomen  (12/22)  Last BM:  1/5 -type 6  Height:   Ht Readings from Last 1 Encounters:  02/01/19 5\' 4"  (1.626 m)    Weight:   Wt Readings from Last 1 Encounters:  02/15/19 63.1 kg    Ideal Body Weight:  54.5 kg  BMI:  Body mass index is 23.88 kg/m.  Estimated Nutritional Needs:   Kcal:  1845-2030 kcal  Protein:  90-105 grams  Fluid:  >/= 1.8 L/day  Clayton Bibles, MS, RD, LDN Inpatient Clinical Dietitian Pager: [phone] After Hours Pager: [phone] b

## 2019-02-16 NOTE — Care Management Important Message (Signed)
Important Message  Patient Details IM Letter given to [facility] Case Manager to present to the Patient Name: Megan Hester MRN: [id number] Date of Birth: 03-Aug-1930   Medicare Important Message Given:  Yes     Kerin Salen 02/16/2019, 11:52 AM

## 2019-02-16 NOTE — Progress Notes (Addendum)
15 Days Post-Op    CC: Nausea, vomiting, diarrhea and hypertension  Subjective: Patient looks good this a.m.  She is sitting up at side of bed.  She says she  is tolerating her diet, she had a small bowel movement.  They are working on ambulation with her.  Her midline incision is clean and looks good.  She is not taking anything for pain and I actually added Tylenol.  As far as she knows no one is talked to her about wound care.  It is apparently looking for SNF, but I will have them teach the family how to do dressing changes just a little be aware.  Objective: Vital signs in last 24 hours: Temp:  [97.6 F (36.4 C)-98.1 F (36.7 C)] 97.9 F (36.6 C) (01/05 1952) Pulse Rate:  [56-112] 98 (01/05 1952) Resp:  [18-25] 20 (01/05 1952) BP: (88-100)/(64-85) 88/64 (01/05 1952) SpO2:  [97 %-100 %] 99 % (01/05 1952) Last BM Date: 02/15/19 Nothing p.o. recorded  3 cc IV recorded BM x2 Urine 450 Afebrile heart rate not recorded since 11 PM No labs this a.m.  intake/Output from previous day: 01/05 0701 - 01/06 0700 In: 3 [I.V.:3] Out: 450 [Urine:450] Intake/Output this shift: No intake/output data recorded.  General appearance: alert, cooperative and no distress Resp: clear to auscultation bilaterally GI: Soft, sore, midline incision looks fine.  She is tolerating a regular diet and having bowel movements.  Lab Results:  Recent Labs    02/13/19 0841 02/14/19 0326  WBC 11.6* 8.9  HGB 9.0* 8.2*  HCT 27.8* 25.7*  PLT 246 279    BMET Recent Labs    02/14/19 0326 02/15/19 0144  NA 141  --   K 3.5  --   CL 109  --   CO2 24  --   GLUCOSE 91  --   BUN 16  --   CREATININE 0.62 0.63  CALCIUM 8.3*  --    PT/INR No results for input(s): LABPROT, INR in the last 72 hours.  Recent Labs  Lab 02/10/19 0805 02/11/19 0500 02/14/19 0326  AST 19 22 17   ALT 21 22 20   ALKPHOS 71 70 97  BILITOT 1.4* 1.0 1.5*  PROT 4.8* 4.5* 5.1*  ALBUMIN 2.6* 2.2* 2.7*     Lipase      Component Value Date/Time   LIPASE 26 02/01/2019 1056     Medications: . bisacodyl  10 mg Rectal Daily  . Chlorhexidine Gluconate Cloth  6 each Topical Daily  . digoxin  0.125 mg Oral Daily  . famotidine  20 mg Oral Daily  . feeding supplement (ENSURE ENLIVE)  237 mL Oral BID BM  . lip balm  1 application Topical BID  . mouth rinse  15 mL Mouth Rinse BID  . Melatonin  5 mg Oral QHS  . metoprolol tartrate  100 mg Oral BID  . neomycin-polymyxin b-dexamethasone  1 application Right Eye QHS  . polyvinyl alcohol   Right Eye BID  . rivaroxaban  20 mg Oral Q supper  . sodium chloride flush  10-40 mL Intracatheter Q12H   . methocarbamol (ROBAXIN) IV    . ondansetron (ZOFRAN) IV     Anti-infectives (From admission, onward)   Start     Dose/Rate Route Frequency Ordered Stop   02/02/19 0600  meropenem (MERREM) 1 g in sodium chloride 0.9 % 100 mL IVPB     1 g 200 mL/hr over 30 Minutes Intravenous Every 12 hours 02/01/19 2243 02/04/19 0604  02/01/19 1730  clindamycin (CLEOCIN) IVPB 900 mg     900 mg 100 mL/hr over 30 Minutes Intravenous On call to O.R. 02/01/19 1654 02/01/19 2009   02/01/19 1730  gentamicin (GARAMYCIN) 310 mg in dextrose 5 % 100 mL IVPB     5 mg/kg  61.2 kg 107.8 mL/hr over 60 Minutes Intravenous On call to O.R. 02/01/19 1654 02/01/19 1957   02/01/19 1545  meropenem (MERREM) 1 g in sodium chloride 0.9 % 100 mL IVPB     1 g 200 mL/hr over 30 Minutes Intravenous STAT 02/01/19 1533 02/01/19 1724      Assessment/Plan Sepsis Sick sinus syndrome Atrial fibrillation -Xarelto/flecainide/Cardizem  - -per Cardiology - starting digoxin for rate control Hypertension Hx chronic constipation Hx right IN nuclear lesion Anemia -Hgb 8.2 Postop delirium -resolved  Closed-loop bowel obstruction with ischemia and early perforation, umbilical hernia [id number] - diagnostic laparoscopy with lysis of adhesions, small bowel resection, tap block, primary umbilical hernia repair,  application of wound VAC 02/01/19 - Dr.Gross -continuingwith BM's last night and this AM -regular diet today -discontinue TNA -oob as much as possible, pulm toilet -bid dressing changes   FEN: Regular diet ID: Meropenem 12/22-12/25/20 DVT Xarelto Follow-up: Dr. Johney Maine  Plan: I think from a surgical standpoint she is ready to go home when medically stable.  She has a follow-up appointment with Dr. Johney Maine and discharge instructions in the AVS.   LOS: 15 days    Megan Hester 02/16/2019 Please see Amion

## 2019-02-17 NOTE — TOC Transition Note (Signed)
Transition of Care [facility]) - CM/SW Discharge Note   Patient Details  Name: Megan Hester MRN: [id number] Date of Birth: March 23, 1930  Transition of Care [facility]) CM/SW Contact:  Lia Hopping, [place] Phone Number: 02/16/2019, 8:27 AM   Clinical Narrative:    Son at bedside. DME provided. Home Health confirmed.    Final next level of care: [place] Barriers to Discharge: Barriers Resolved   Patient Goals and CMS Choice     Choice offered to / list presented to : Adult Children  Discharge Placement                  Name of family member notified: Son at bedside    Discharge Plan and Services   Discharge Planning Services: CM Consult Post Acute Care Choice: Home Health          DME Arranged: 3-N-1, [place] rolling DME Agency: [employer] Date DME Agency Contacted: 02/17/19   Representative spoke with at DME Agency: DME onsite at [place]: PT, OT, RN [facility] Agency: [place] Date [place]: 02/16/19 Time [place]: 52 Representative spoke with at [place]: [place] ([place]) Interventions     Readmission Risk Interventions Readmission Risk Prevention Plan 02/08/2019  Transportation Screening Complete  PCP or Specialist Appt within 3-5 Days Complete  HRI or [place] Complete  Social Work Consult for [place] Planning/Counseling Complete  Palliative Care Screening Complete  Medication Review Press [employer]) Complete  Some recent data might be hidden

## 2019-02-17 NOTE — Telephone Encounter (Signed)
Patient contacted regarding discharge from [facility] on 02/16/2019.  Patient understands to follow up with provider Gardiner Rhyme MD on 02/24/2019 at 9:20am at [facility]. Patient understands discharge instructions? YES Patient understands medications and regiment? YES Patient understands to bring all medications to this visit? YES  Patient is already signed up for MyChart

## 2019-02-24 ENCOUNTER — Ambulatory Visit: Payer: Medicare Other | Admitting: Cardiology

## 2019-03-05 ENCOUNTER — Telehealth: Payer: Self-pay | Admitting: Physician Assistant

## 2019-03-05 NOTE — Telephone Encounter (Signed)
Paged by answering service.  Patient is feeling weak, dizzy and somewhat disoriented with water retention in her leg as well as intermittent incontinent since starting flecainide 03/02/2019.  Spoke with son over phone. No symptoms of orthopnea, PND, chest pain, palpitation, nausea, vomiting, diarrhea or abdominal pain.  No fever or chills.  Seems she is constipated.  I do not think her symptoms due to flecainide.  I have advised him to hold 1 or 2 doses and see if her symptoms improves.  If it does not, advised to seek medical attention.  Son is in agreement with plan.

## 2019-03-08 ENCOUNTER — Ambulatory Visit (INDEPENDENT_AMBULATORY_CARE_PROVIDER_SITE_OTHER): Payer: Medicare Other | Admitting: *Deleted

## 2019-03-08 ENCOUNTER — Telehealth: Payer: Self-pay

## 2019-03-08 DIAGNOSIS — I495 Sick sinus syndrome: Secondary | ICD-10-CM

## 2019-03-08 NOTE — Telephone Encounter (Signed)
I help the pt son send a manual transmission. I told him if the nurse see anything he will get a call back. If everything looks normal he will not receive a call. I added the pt to the schedule.

## 2019-03-09 LAB — CUP PACEART REMOTE DEVICE CHECK
Battery Remaining Longevity: 68 mo
Battery Voltage: 3.01 V
Brady Statistic AP VP Percent: 3.78 %
Brady Statistic AP VS Percent: 69.45 %
Brady Statistic AS VP Percent: 6.88 %
Brady Statistic AS VS Percent: 19.9 %
Brady Statistic RA Percent Paced: 63.88 %
Brady Statistic RV Percent Paced: 9.44 %
Date Time Interrogation Session: 20210126192835
Implantable Lead Implant Date: 20180115
Implantable Lead Implant Date: 20180115
Implantable Lead Location: 753859
Implantable Lead Location: 753860
Implantable Lead Model: 5076
Implantable Lead Model: 5076
Implantable Pulse Generator Implant Date: 20180115
Lead Channel Impedance Value: 304 Ohm
Lead Channel Impedance Value: 380 Ohm
Lead Channel Impedance Value: 399 Ohm
Lead Channel Impedance Value: 456 Ohm
Lead Channel Pacing Threshold Amplitude: 0.75 V
Lead Channel Pacing Threshold Amplitude: 0.75 V
Lead Channel Pacing Threshold Pulse Width: 0.4 ms
Lead Channel Pacing Threshold Pulse Width: 0.4 ms
Lead Channel Sensing Intrinsic Amplitude: 0.375 mV
Lead Channel Sensing Intrinsic Amplitude: 0.375 mV
Lead Channel Sensing Intrinsic Amplitude: 10.25 mV
Lead Channel Sensing Intrinsic Amplitude: 10.25 mV
Lead Channel Setting Pacing Amplitude: 2 V
Lead Channel Setting Pacing Amplitude: 2.5 V
Lead Channel Setting Pacing Pulse Width: 0.4 ms
Lead Channel Setting Sensing Sensitivity: 2 mV

## 2019-03-15 NOTE — Progress Notes (Signed)
Cardiology Office Note   Date:  03/16/2019   ID:  Megan Hester, Megan Hester [birth year], MRN Megan Hester  PCP:  Megan Bunting, MD  Cardiologist:  Dr.Schumann  CC: Follow hospitalization    History of Present Illness: Megan Hester is a 86 y.o. female who presents for post hospital follow up after admission for SBO with small bowel resection on 02/01/2019, with atrial fib with RVR post operatively, (know hx of PAF), with hx of PPM ([employer] [id number] Advisa DR MRI), hypertension, acute blood loss anemia. Other history includes Type II diabetes.  On discharge metoprolol was increased to 100 mg BID, flecainide was on hold with plans to resume on 02/28/2019, digoxin was added, Xarelto resumed. Amiodarone, was discontinued due to elevated LFT's   Since returning home she did start the flecainide back but has not tolerated this. She had extensive LEE.  She was told to stop the flecainide, and symptoms were resolved once she stopped this within 48 hours.   Also since returning home, her husband died (10 days ago), she continues to grieve this. Good family support  Past Medical History:  Diagnosis Date  . Arthritis    "all over my body" (02/25/2016)  . 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 GERD (gastroesophageal reflux disease)    hx; "when I was working"  . Hypertension   . Macular degeneration   . Paroxysmal atrial fibrillation (HCC)   . Presence of permanent cardiac pacemaker   . 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 APPLICATION OF WOUND VAC Bilateral 02/01/2019   Procedure: APPLICATION OF WOUND VAC;  Surgeon: Megan Boston, MD;  Location: [facility] ORS;  Service: General;  Laterality: Bilateral;  . BREAST BIOPSY Right   . BREAST LUMPECTOMY Right 1999   RADIATION  . CARDIAC CATHETERIZATION  02/05/1999   HYPERDYNAMIC LEFT VENTRICLE WITH EF OF 75-80%  . CARDIOVERSION N/A 01/22/2015   Procedure:  CARDIOVERSION;  Surgeon: Megan Spark, MD;  Location[facility] ENDOSCOPY;  Service: Cardiovascular;  Laterality: N/A;  . CARDIOVERSION N/A 02/22/2015   Procedure: CARDIOVERSION;  Surgeon: Megan Perla, MD;  Location: [facility] ENDOSCOPY;  Service: Cardiovascular;  Laterality: N/A;  . DILATION AND CURETTAGE OF UTERUS    . ENUCLEATION Right 2012  . EP IMPLANTABLE DEVICE N/A 02/25/2016   Procedure: Pacemaker Implant;  Surgeon: Megan Grayer, MD;  Location: [place] CV LAB;  Service: Cardiovascular;  Laterality: N/A;  . INSERT / REPLACE / REMOVE PACEMAKER    . LAPAROSCOPIC SMALL BOWEL RESECTION N/A 02/01/2019   Procedure: LAPAROSCOPIC SMALL BOWEL RESECTION WITH TAP BLOCK;  Surgeon: Megan Boston, MD;  Location: [facility] ORS;  Service: General;  Laterality: N/A;  . LAPAROSCOPY N/A 02/01/2019   Procedure: LAPAROSCOPY DIAGNOSTIC;  Surgeon: Megan Boston, MD;  Location: [facility] ORS;  Service: General;  Laterality: N/A;  . LYSIS OF ADHESION N/A 02/01/2019   Procedure: LYSIS OF ADHESION;  Surgeon: Megan Boston, MD;  Location: [facility] ORS;  Service: General;  Laterality: N/A;  . TONSILLECTOMY       Current Outpatient Medications  Medication Sig Dispense Refill  . acetaminophen (TYLENOL) 650 MG CR tablet Take 650 mg by mouth daily as needed for pain.    . cetirizine (ZYRTEC) 10 MG tablet Take 10 mg by mouth at bedtime.     . Cholecalciferol (VITAMIN D) 1000 UNITS capsule Take 1,000 Units by mouth at bedtime.     [name] Kitchen  clobetasol cream (TEMOVATE) [id number] % Apply 1 application topically daily as needed (dermatitis).     [name] Kitchen digoxin (LANOXIN) 0.125 MG tablet Take 1 tablet (0.125 mg total) by mouth daily. 30 tablet 1  . flecainide (TAMBOCOR) 100 MG tablet Take 1 tablet (100 mg total) by mouth 2 (two) times daily. Restart flecainide on 02/28/2019 180 tablet 0  . Glucosamine-Chondroitin (GLUCOSAMINE CHONDR COMPLEX PO) Take 1 capsule by mouth daily.    . Hypromellose (ARTIFICIAL TEARS OP) Place 1 drop into the left eye 2 (two) times  daily.     . metoprolol tartrate (LOPRESSOR) 100 MG tablet Take 1 tablet (100 mg total) by mouth 2 (two) times daily. 60 tablet 1  . Multiple Vitamins-Minerals (CENTRUM SILVER PO) Take 1 capsule by mouth daily.     . Multiple Vitamins-Minerals (PRESERVISION/LUTEIN PO) Take 1 capsule by mouth 2 (two) times daily.     [name] Kitchen neomycin-polymyxin-dexameth (MAXITROL) 0.1 % OINT Place 1 application into the right eye at bedtime.    Megan Hester Glycol-Propyl Glycol (SYSTANE OP) Place 1 drop into the right eye 2 (two) times daily.     . polyethylene glycol (MIRALAX / GLYCOLAX) packet Take 17 g by mouth daily as needed for moderate constipation.     . sertraline (ZOLOFT) 100 MG tablet Take 100 mg by mouth at bedtime.     Megan Hester 20 MG TABS tablet TAKE 1 TABLET ONCE DAILY WITH SUPPER. (Patient taking differently: Take 20 mg by mouth daily with supper. ) 90 tablet 1   No current facility-administered medications for this visit.    Allergies:   Shellfish allergy, Atropine, Codeine, Doxycycline, Fenoprofen calcium, Iodinated diagnostic agents, Isopto hyoscine [scopolamine], Levaquin [levofloxacin], Other, Oxycodone, Penicillins, Sulfa drugs cross reactors, Warfarin, Ceclor [cefaclor], and Tape    Social History:  The patient  reports that she has never smoked. She has never used smokeless tobacco. She reports current alcohol use. She reports that she does not use drugs.   Family History:  The patient's family history includes Diabetes in her brother; Heart attack in her father; Heart disease in her brother; Hypertension in her father; Lung cancer in her brother; Parkinsonism in her father; Stroke in her mother.    ROS: All other systems are reviewed and negative. Unless otherwise mentioned in H&P    PHYSICAL EXAM: VS:  BP 125/73   Pulse 81   Temp 97.7 F (36.5 C)   Ht 5\' 4"  (1.626 m)   Wt 130 lb 9.6 oz (59.2 kg)   SpO2 99%   BMI 22.42 kg/m  , BMI Body mass index is 22.42 kg/m.  GEN: Well nourished,  well developed, in no acute distress, some frailty noted.  HEENT: normal Neck: no JVD, carotid bruits, or masses Cardiac: RRR; no murmurs, rubs, or gallops,no edema  Respiratory:  Clear to auscultation bilaterally, normal work of breathing GI: soft, nontender, nondistended, + BS MS: no deformity or atrophy Skin: warm and dry, no rash. Pale  Neuro:  Strength and sensation are intact Psych: euthymic mood, full affect   EKG: Not completed this office visit.   Recent Labs: 02/07/2019: TSH 1.551 02/14/2019: ALT 20; BUN 16; Hemoglobin 8.2; Magnesium 2.0; Platelets 279; Potassium 3.5; Sodium 141 02/15/2019: Creatinine, Ser 0.63    Lipid Panel    Component Value Date/Time   TRIG 95 02/14/2019 0326      Wt Readings from Last 3 Encounters:  03/16/19 130 lb 9.6 oz (59.2 kg)  02/16/19 135 lb 9.3 oz (61.5 kg)  11/01/18 148 lb (67.1 kg)      Other studies Reviewed: Echo 02/08/19: IMPRESSIONS  1. Left ventricular ejection fraction, by visual estimation, is 50 to 55%. The left ventricle has low normal function. Left ventricular septal wall thickness was normal. Normal left ventricular posterior wall thickness. There is no left ventricular  hypertrophy. 2. Left ventricular diastolic parameters are indeterminate. 3. The left ventricle has no regional wall motion abnormalities. 4. Global right ventricle has normal systolic function.The right ventricular size is normal. No increase in right ventricular wall thickness. 5. Left atrial size was normal. 6. Right atrial size was normal. 7. The mitral valve is normal in structure. Trivial mitral valve regurgitation. No evidence of mitral stenosis. 8. The tricuspid valve is normal in structure. 9. The aortic valve is tricuspid. Aortic valve regurgitation is not visualized. No evidence of aortic valve sclerosis or stenosis. 10. The pulmonic valve was normal in structure. Pulmonic valve regurgitation is not visualized. 11. Mildly elevated  pulmonary artery systolic pressure. 12. A pacer wire is visualized. 13. The inferior vena cava is normal in size with greater than 50% respiratory variability, suggesting right atrial pressure of 3 mmHg.    ASSESSMENT AND PLAN:  1.Atrial fib with RVR: She is well controlled currently. She DID NOT tolerate Flecainide causing significant edema. She has stopped taking this with resolution of symptoms. Due to her side effects, I will not restart this medication. She will continue metoprolol and digoxin, along with anticoagulation with Xarelto. She will have follow up BMET and CBC. She will see Dr. Gardiner Rhyme on follow up.   2. SBO: Resolved. Eating and drinking, able to take her medications, no melena on Xarelto.   3. Hypertension: Well controlled currently. No changes.  4. PPM in situ: Remote check on 03/08/2019, reviewed by Dr. Rayann Heman with AF burden 27%.    5. Situational depression: Lost husband 10 days ago. She is recovering from a lot of health stress and personal stressors, but appears to be well supported by family members.   Current medicines are reviewed at length with the patient today.    Labs/ tests ordered today include: CBC, BMET  Phill Myron. [place] Pugh, ANP, AACC   03/16/2019 5:18 PM    [address] Office [phone] Fax [phone]  Notice: This dictation was prepared with Dragon dictation along with smaller phrase technology. Any transcriptional errors that result from this process are unintentional and may not be corrected upon review.

## 2019-03-16 ENCOUNTER — Ambulatory Visit: Payer: Medicare Other | Admitting: Adult Health

## 2019-03-16 ENCOUNTER — Encounter: Payer: Self-pay | Admitting: Adult Health

## 2019-03-16 ENCOUNTER — Other Ambulatory Visit: Payer: Self-pay

## 2019-03-16 VITALS — BP 125/73 | HR 81 | Temp 97.7°F | Ht 64.0 in | Wt 130.6 lb

## 2019-03-16 DIAGNOSIS — Z79899 Other long term (current) drug therapy: Secondary | ICD-10-CM

## 2019-03-16 DIAGNOSIS — Z950 Presence of cardiac pacemaker: Secondary | ICD-10-CM

## 2019-03-16 DIAGNOSIS — I10 Essential (primary) hypertension: Secondary | ICD-10-CM | POA: Diagnosis not present

## 2019-03-16 DIAGNOSIS — I480 Paroxysmal atrial fibrillation: Secondary | ICD-10-CM | POA: Diagnosis not present

## 2019-03-16 NOTE — Patient Instructions (Signed)
Medication Instructions:  Continue current medications  *If you need a refill on your cardiac medications before your next appointment, please call your pharmacy*  Lab Work: CBC and BMP  Testing/Procedures: None Ordered  Follow-Up: At [facility], you and your health needs are our priority.  As part of our continuing mission to provide you with exceptional heart care, we have created designated Provider Care Teams.  These Care Teams include your primary Cardiologist (physician) and Advanced Practice Providers (APPs -  Physician Assistants and Nurse Practitioners) who all work together to provide you with the care you need, when you need it.  Your next appointment:   3 month(s)  The format for your next appointment:   In Person  Provider:   Oswaldo Milian, MD

## 2019-03-23 ENCOUNTER — Encounter: Payer: Self-pay | Admitting: *Deleted

## 2019-03-23 DIAGNOSIS — Z79899 Other long term (current) drug therapy: Secondary | ICD-10-CM

## 2019-03-23 LAB — CBC
Hematocrit: 27.3 % — ABNORMAL LOW (ref 34.0–46.6)
Hemoglobin: 8.6 g/dL — ABNORMAL LOW (ref 11.1–15.9)
MCH: 33.9 pg — ABNORMAL HIGH (ref 26.6–33.0)
MCHC: 31.5 g/dL (ref 31.5–35.7)
MCV: 108 fL — ABNORMAL HIGH (ref 79–97)
Platelets: 287 10*3/uL (ref 150–450)
RBC: 2.54 x10E6/uL — CL (ref 3.77–5.28)
RDW: 15.5 % — ABNORMAL HIGH (ref 11.7–15.4)
WBC: 6.8 10*3/uL (ref 3.4–10.8)

## 2019-03-23 LAB — BASIC METABOLIC PANEL
BUN/Creatinine Ratio: 18 (ref 12–28)
BUN: 15 mg/dL (ref 8–27)
CO2: 25 mmol/L (ref 20–29)
Calcium: 8.9 mg/dL (ref 8.7–10.3)
Chloride: 107 mmol/L — ABNORMAL HIGH (ref 96–106)
Creatinine, Ser: 0.85 mg/dL (ref 0.57–1.00)
GFR calc Af Amer: 72 mL/min/{1.73_m2} (ref >59)
GFR calc non Af Amer: 62 mL/min/{1.73_m2} (ref >59)
Glucose: 80 mg/dL (ref 65–99)
Potassium: 5.4 mmol/L — ABNORMAL HIGH (ref 3.5–5.2)
Sodium: 141 mmol/L (ref 134–144)

## 2019-03-23 NOTE — Progress Notes (Signed)
BMP ordered and mailed to pt, letter with result mailed to pt.

## 2019-04-03 ENCOUNTER — Other Ambulatory Visit: Payer: Self-pay | Admitting: Internal Medicine

## 2019-04-04 NOTE — Telephone Encounter (Addendum)
Prescription refill request for Xarelto received.   Last office visit: Purcell Nails, 03/16/2019 Weight: 59.2 kg Age: 86 y.o. Scr: 0.85, 03/22/2019 CrCl: 44 ml/min   Pt qualifies for a dose decrease. Reviewed dose reduction with Melissa Pharm D. Called and spoke to pt, she confirmed she has had weight loss this past year. Informed her that we would decrease her dosage of Xarelto to 15mg  daily.

## 2019-04-13 ENCOUNTER — Other Ambulatory Visit: Payer: Self-pay | Admitting: Internal Medicine

## 2019-04-25 ENCOUNTER — Encounter: Payer: Self-pay | Admitting: Cardiology

## 2019-06-07 ENCOUNTER — Ambulatory Visit (INDEPENDENT_AMBULATORY_CARE_PROVIDER_SITE_OTHER): Payer: Medicare Other | Admitting: *Deleted

## 2019-06-07 DIAGNOSIS — I495 Sick sinus syndrome: Secondary | ICD-10-CM | POA: Diagnosis not present

## 2019-06-07 LAB — CUP PACEART REMOTE DEVICE CHECK
Battery Remaining Longevity: 74 mo
Battery Voltage: 3.01 V
Brady Statistic AP VP Percent: 2.68 %
Brady Statistic AP VS Percent: 60.22 %
Brady Statistic AS VP Percent: 10.22 %
Brady Statistic AS VS Percent: 26.88 %
Brady Statistic RA Percent Paced: 57.05 %
Brady Statistic RV Percent Paced: 11.95 %
Date Time Interrogation Session: 20210427183325
Implantable Lead Implant Date: 20180115
Implantable Lead Implant Date: 20180115
Implantable Lead Location: 753859
Implantable Lead Location: 753860
Implantable Lead Model: 5076
Implantable Lead Model: 5076
Implantable Pulse Generator Implant Date: 20180115
Lead Channel Impedance Value: 342 Ohm
Lead Channel Impedance Value: 399 Ohm
Lead Channel Impedance Value: 418 Ohm
Lead Channel Impedance Value: 456 Ohm
Lead Channel Pacing Threshold Amplitude: 0.5 V
Lead Channel Pacing Threshold Amplitude: 0.625 V
Lead Channel Pacing Threshold Pulse Width: 0.4 ms
Lead Channel Pacing Threshold Pulse Width: 0.4 ms
Lead Channel Sensing Intrinsic Amplitude: 0.5 mV
Lead Channel Sensing Intrinsic Amplitude: 0.5 mV
Lead Channel Sensing Intrinsic Amplitude: 10.875 mV
Lead Channel Sensing Intrinsic Amplitude: 10.875 mV
Lead Channel Setting Pacing Amplitude: 2 V
Lead Channel Setting Pacing Amplitude: 2.5 V
Lead Channel Setting Pacing Pulse Width: 0.4 ms
Lead Channel Setting Sensing Sensitivity: 2 mV

## 2019-06-08 NOTE — Progress Notes (Signed)
PPM Remote  

## 2019-06-19 NOTE — Progress Notes (Signed)
Cardiology Office Note:    Date:  06/21/2019   ID:  Megan Hester, DOB [birth year], MRN [id number]  PCP:  [name], MD  Cardiologist:  Donato Heinz, MD  Electrophysiologist:  [name], MD   Referring MD: [name], MD   Chief complaint: Atrial fibrillation  History of Present Illness:    Megan Hester is a 86 y.o. female with a hx of SBO with small bowel resection 02/01/2019, paroxysmal atrial fibrillation, sick sinus syndrome status post PPM, hypertension, type 2 diabetes who presents for follow-up.  She was admitted to [facility] with SBO and underwent small bowel resection 02/01/2019.  Her postoperative course was complicated by AF with RVR.  Given her hypotension, she was initially started on amiodarone drip.  She was transitioned off amiodarone due to elevated LFTs and started on metoprolol and digoxin for rate control.  Plan on discharge on 02/16/2019 was for 2 weeks of digoxin and then restart flecainide.  She reported that when she restarted flecainide 2 weeks post discharge, she developed extensive lower extremity edema and weakness/dizziness.  Her symptoms resolved with stopping flecainide.  Most recent device check 06/07/2019 showed AF burden 39%, rates controlled.  TTE 02/08/2019 showed LVEF 50 to 55%, normal RV function, indeterminate diastolic function, no significant valvular disease.   Since last clinic visit, she reports that she is doing better. Denies any chest pain or dyspnea. Reports occasional lightheadedness. Daughter-in-law states that it seems to be correlated to dehydration, she become symptomatic when she is not eating and drinking enough. She is taking Xarelto. Denies any bleeding issues.     Wt Readings from Last 3 Encounters:  06/21/19 122 lb 9.6 oz (55.6 kg)  03/16/19 130 lb 9.6 oz (59.2 kg)  02/16/19 135 lb 9.3 oz (61.5 kg)      Past Medical History:  Diagnosis Date  . Arthritis    "all over my body" (02/25/2016)    . 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 GERD (gastroesophageal reflux disease)    hx; "when I was working"  . Hypertension   . Macular degeneration   . Paroxysmal atrial fibrillation (HCC)   . Presence of permanent cardiac pacemaker   . 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 APPLICATION OF WOUND VAC Bilateral 02/01/2019   Procedure: APPLICATION OF WOUND VAC;  Surgeon: [name], MD;  Location: [facility];  Service: General;  Laterality: Bilateral;  . BREAST BIOPSY Right   . BREAST LUMPECTOMY Right 1999   RADIATION  . CARDIAC CATHETERIZATION  02/05/1999   HYPERDYNAMIC LEFT VENTRICLE WITH EF OF 75-80%  . CARDIOVERSION N/A 01/22/2015   Procedure: CARDIOVERSION;  Surgeon: [name], MD;  Location: [facility] ENDOSCOPY;  Service: Cardiovascular;  Laterality: N/A;  . CARDIOVERSION N/A 02/22/2015   Procedure: CARDIOVERSION;  Surgeon: [name], MD;  Location[facility] END[facility]COPY;  Service: Cardiovascular;  Laterality: N/A;  . DILATION AND CURETTAGE OF UTERUS    . ENUCLEATION Right 2012  . EP IMPLANTABLE DEVICE N/A 02/25/2016   Procedure: Pacemaker Implant;  Surgeon: [name], MD;  Location: [facility] CV LAB;  Service: Cardiovascular;  Laterality: N/A;  . INSERT / REPLACE / REMOVE PACEMAKER    . LAPAROSCOPIC SMALL BOWEL RESECTION N/A 02/01/2019   Procedure: LAPAROSCOPIC SMALL BOWEL RESECTION WITH TAP BLOCK;  Surgeon: [name], MD;  [facility]: [facility];  Service: General;  Laterality: N/A;  .  LAPAROSCOPY N/A 02/01/2019   Procedure: LAPAROSCOPY DIAGNOSTIC;  Surgeon: [name], MD;  Location: [facility];  Service: General;  Laterality: N/A;  . LYSIS OF ADHESION N/A 02/01/2019   Procedure: LYSIS OF ADHESION;  Surgeon: [name], MD;  Location: [facility];  Service: General;  Laterality: N/A;  . TONSILLECTOMY      Current Medications: No outpatient medications have been marked as taking for the 06/21/19  encounter (Office Visit) with Donato Heinz, MD.     Allergies:   Shellfish allergy, Atropine, Codeine, Doxycycline, Fenoprofen calcium, Iodinated diagnostic agents, Isopto hyoscine [scopolamine], Levaquin [levofloxacin], Other, Oxycodone, Penicillins, Sulfa drugs cross reactors, Warfarin, Ceclor [cefaclor], and Tape   Social History   Socioeconomic History  . Marital status: Married    Spouse name: Not on file  . Number of children: 3  . Years of education: Not on file  . Highest education level: Not on file  Occupational History    Employer: RETIRED  Tobacco Use  . Smoking status: Never Smoker  . Smokeless tobacco: Never Used  Substance and Sexual Activity  . Alcohol use: Yes    Alcohol/week: 0.0 standard drinks    Comment: 02/25/2016 "glass of wine/month"  . Drug use: No  . Sexual activity: Yes    Birth control/protection: Post-menopausal    Comment: 1st intercourse 55 yo-1 partner  Other Topics Concern  . Not on file  Social History Narrative   Lives in [place].  Retired Solicitor.   Social Determinants of Health   Financial Resource Strain:   . Difficulty of Paying Living Expenses:   Food Insecurity:   . Worried About Charity fundraiser in the Last Year:   . Arboriculturist in the Last Year:   Transportation Needs:   . Film/video editor (Medical):   [name] Kitchen Lack of Transportation (Non-Medical):   Physical Activity:   . Days of Exercise per Week:   . Minutes of Exercise per Session:   Stress:   . Feeling of Stress :   Social Connections:   . Frequency of Communication with Friends and Family:   . Frequency of Social Gatherings with Friends and Family:   . Attends Religious Services:   . Active Member of Clubs or Organizations:   . Attends Archivist Meetings:   [name] Kitchen Marital Status:      Family History: The patient's family history includes Diabetes in her brother; Heart attack in her father; Heart disease in her brother;  Hypertension in her father; Lung cancer in her brother; Parkinsonism in her father; Stroke in her mother.  ROS:   Please see the history of present illness.    All other systems reviewed and are negative.  EKGs/Labs/Other Studies Reviewed:    The following studies were reviewed today:  EKG:  EKG is ordered today.  The ekg ordered today demonstrates atrial paced, ventricular sensed rhythm, PACs, poor R wave progression  Recent Labs: 02/07/2019: TSH 1.551 02/14/2019: ALT 20; Magnesium 2.0 03/22/2019: BUN 15; Creatinine, Ser 0.85; Hemoglobin 8.6; Platelets 287; Potassium 5.4; Sodium 141  Recent Lipid Panel    Component Value Date/Time   TRIG 95 02/14/2019 0326    Physical Exam:    VS:  BP 118/68   Pulse 66   Ht 5\' 4"  (1.626 m)   Wt 122 lb 9.6 oz (55.6 kg)   SpO2 97%   BMI 21.04 kg/m     Wt Readings from Last 3 Encounters:  06/21/19 122 lb 9.6 oz (  55.6 kg)  03/16/19 130 lb 9.6 oz (59.2 kg)  02/16/19 135 lb 9.3 oz (61.5 kg)     GEN:  in no acute distress HEENT: Normal NECK: No JVD CARDIAC: RRR, no murmurs, rubs, gallops RESPIRATORY:  Clear to auscultation without rales, wheezing or rhonchi  ABDOMEN: Soft, non-tender MUSCULOSKELETAL:  No edema SKIN: Warm and dry NEUROLOGIC:  Alert and oriented x 3 PSYCHIATRIC:  Normal affect   ASSESSMENT:    1. Paroxysmal atrial fibrillation (HCC)   2. Sick sinus syndrome ([place])   3. Pacemaker   4. Current use of anticoagulant therapy   5. Encounter for monitoring digoxin therapy    PLAN:    Atrial fibrillation: Had episodes of RVR after her small bowel resection in December.  Recent device interrogation shows AF burden 39%, with rates well controlled on metoprolol 100 mg twice daily and digoxin 0.125 mg daily.  She tried going back on flecainide but did not tolerate due to significant edema. -Continue metoprolol, will check digoxin level and consider stopping digoxin -Continue Xarelto  Hypertension: Appears well controlled.  Continue metoprolol  SSS: s/p PPM: Follows with EP  Hyperkalemia: Potassium 5.4 on last labs 03/22/2019. Will recheck BMP  Anemia: Hemoglobin 8.6 on 03/22/2019. Denies any bleeding issues. Will check CBC.  RTC in 6 months    Medication Adjustments/Labs and Tests Ordered: Current medicines are reviewed at length with the patient today.  Concerns regarding medicines are outlined above.  Orders Placed This Encounter  Procedures  . Digoxin level  . Basic metabolic panel  . CBC  . EKG 12-Lead   No orders of the defined types were placed in this encounter.   Patient Instructions  Medication Instructions:  Your physician recommends that you continue on your current medications as directed. Please refer to the Current Medication list given to you today.  *If you need a refill on your cardiac medications before your next appointment, please call your pharmacy*   Lab Work: Dig Level, BMET, CBC  If you have labs (blood work) drawn today and your tests are completely normal, you will receive your results only by: [name] Kitchen MyChart Message (if you have MyChart) OR . A paper copy in the mail If you have any lab test that is abnormal or we need to change your treatment, we will call you to review the results.  Follow-Up: At [facility], you and your health needs are our priority.  As part of our continuing mission to provide you with exceptional heart care, we have created designated Provider Care Teams.  These Care Teams include your primary Cardiologist (physician) and Advanced Practice Providers (APPs -  Physician Assistants and Nurse Practitioners) who all work together to provide you with the care you need, when you need it.  We recommend signing up for the patient portal called "MyChart".  Sign up information is provided on this After Visit Summary.  MyChart is used to connect with patients for Virtual Visits (Telemedicine).  Patients are able to view lab/test results, encounter notes,  upcoming appointments, etc.  Non-urgent messages can be sent to your provider as well.   To learn more about what you can do with MyChart, go to [email or web address].    Your next appointment:   6 month(s)  The format for your next appointment:   In Person  Provider:   Oswaldo Milian, MD         Signed, Donato Heinz, MD  06/21/2019 10:20 PM    [facility]

## 2019-06-21 ENCOUNTER — Other Ambulatory Visit: Payer: Self-pay

## 2019-06-21 ENCOUNTER — Ambulatory Visit: Payer: Medicare Other | Admitting: Cardiology

## 2019-06-21 ENCOUNTER — Encounter: Payer: Self-pay | Admitting: Cardiology

## 2019-06-21 VITALS — BP 118/68 | HR 66 | Ht 64.0 in | Wt 122.6 lb

## 2019-06-21 DIAGNOSIS — Z79899 Other long term (current) drug therapy: Secondary | ICD-10-CM

## 2019-06-21 DIAGNOSIS — I480 Paroxysmal atrial fibrillation: Secondary | ICD-10-CM | POA: Diagnosis not present

## 2019-06-21 DIAGNOSIS — D649 Anemia, unspecified: Secondary | ICD-10-CM

## 2019-06-21 DIAGNOSIS — I495 Sick sinus syndrome: Secondary | ICD-10-CM

## 2019-06-21 DIAGNOSIS — Z5181 Encounter for therapeutic drug level monitoring: Secondary | ICD-10-CM

## 2019-06-21 DIAGNOSIS — Z7901 Long term (current) use of anticoagulants: Secondary | ICD-10-CM | POA: Diagnosis not present

## 2019-06-21 DIAGNOSIS — Z950 Presence of cardiac pacemaker: Secondary | ICD-10-CM

## 2019-06-21 DIAGNOSIS — E875 Hyperkalemia: Secondary | ICD-10-CM

## 2019-06-21 NOTE — Patient Instructions (Signed)
Medication Instructions:  Your physician recommends that you continue on your current medications as directed. Please refer to the Current Medication list given to you today.  *If you need a refill on your cardiac medications before your next appointment, please call your pharmacy*   Lab Work: Dig Level, BMET, CBC  If you have labs (blood work) drawn today and your tests are completely normal, you will receive your results only by: [name] Kitchen MyChart Message (if you have MyChart) OR . A paper copy in the mail If you have any lab test that is abnormal or we need to change your treatment, we will call you to review the results.  Follow-Up: At [facility], you and your health needs are our priority.  As part of our continuing mission to provide you with exceptional heart care, we have created designated Provider Care Teams.  These Care Teams include your primary Cardiologist (physician) and Advanced Practice Providers (APPs -  Physician Assistants and Nurse Practitioners) who all work together to provide you with the care you need, when you need it.  We recommend signing up for the patient portal called "MyChart".  Sign up information is provided on this After Visit Summary.  MyChart is used to connect with patients for Virtual Visits (Telemedicine).  Patients are able to view lab/test results, encounter notes, upcoming appointments, etc.  Non-urgent messages can be sent to your provider as well.   To learn more about what you can do with MyChart, go to [email or web address].    Your next appointment:   6 month(s)  The format for your next appointment:   In Person  Provider:   Oswaldo Milian, MD

## 2019-06-22 LAB — CBC
Hematocrit: 29 % — ABNORMAL LOW (ref 34.0–46.6)
Hemoglobin: 9.8 g/dL — ABNORMAL LOW (ref 11.1–15.9)
MCH: 35.9 pg — ABNORMAL HIGH (ref 26.6–33.0)
MCHC: 33.8 g/dL (ref 31.5–35.7)
MCV: 106 fL — ABNORMAL HIGH (ref 79–97)
Platelets: 255 10*3/uL (ref 150–450)
RBC: 2.73 x10E6/uL — CL (ref 3.77–5.28)
RDW: 13.7 % (ref 11.7–15.4)
WBC: 7 10*3/uL (ref 3.4–10.8)

## 2019-06-22 LAB — BASIC METABOLIC PANEL
BUN/Creatinine Ratio: 23 (ref 12–28)
BUN: 15 mg/dL (ref 8–27)
CO2: 24 mmol/L (ref 20–29)
Calcium: 9.5 mg/dL (ref 8.7–10.3)
Chloride: 105 mmol/L (ref 96–106)
Creatinine, Ser: 0.66 mg/dL (ref 0.57–1.00)
GFR calc Af Amer: 93 mL/min/{1.73_m2} (ref >59)
GFR calc non Af Amer: 80 mL/min/{1.73_m2} (ref >59)
Glucose: 96 mg/dL (ref 65–99)
Potassium: 4.7 mmol/L (ref 3.5–5.2)
Sodium: 141 mmol/L (ref 134–144)

## 2019-06-22 LAB — DIGOXIN LEVEL: Digoxin, Serum: 0.9 ng/mL (ref 0.5–0.9)

## 2019-08-18 ENCOUNTER — Other Ambulatory Visit: Payer: Self-pay | Admitting: Internal Medicine

## 2019-08-18 DIAGNOSIS — Z1231 Encounter for screening mammogram for malignant neoplasm of breast: Secondary | ICD-10-CM

## 2019-08-30 ENCOUNTER — Other Ambulatory Visit: Payer: Self-pay

## 2019-08-30 ENCOUNTER — Ambulatory Visit
Admission: RE | Admit: 2019-08-30 | Discharge: 2019-08-30 | Disposition: A | Discharge status: Home / Self Care | Payer: Medicare Other | Source: Ambulatory Visit | Attending: Internal Medicine | Admitting: Internal Medicine

## 2019-08-30 DIAGNOSIS — Z1231 Encounter for screening mammogram for malignant neoplasm of breast: Secondary | ICD-10-CM

## 2019-08-30 IMAGING — MG DIGITAL SCREENING BILAT W/ TOMO W/ CAD
6 of 10 series · 6 of 30 positions shown · non-contrast
Comparison: Previous exam(s).

CLINICAL DATA: Screening.

EXAM:
DIGITAL SCREENING BILATERAL MAMMOGRAM WITH TOMO AND CAD

[L MLO synth-2D (1 of 2)]
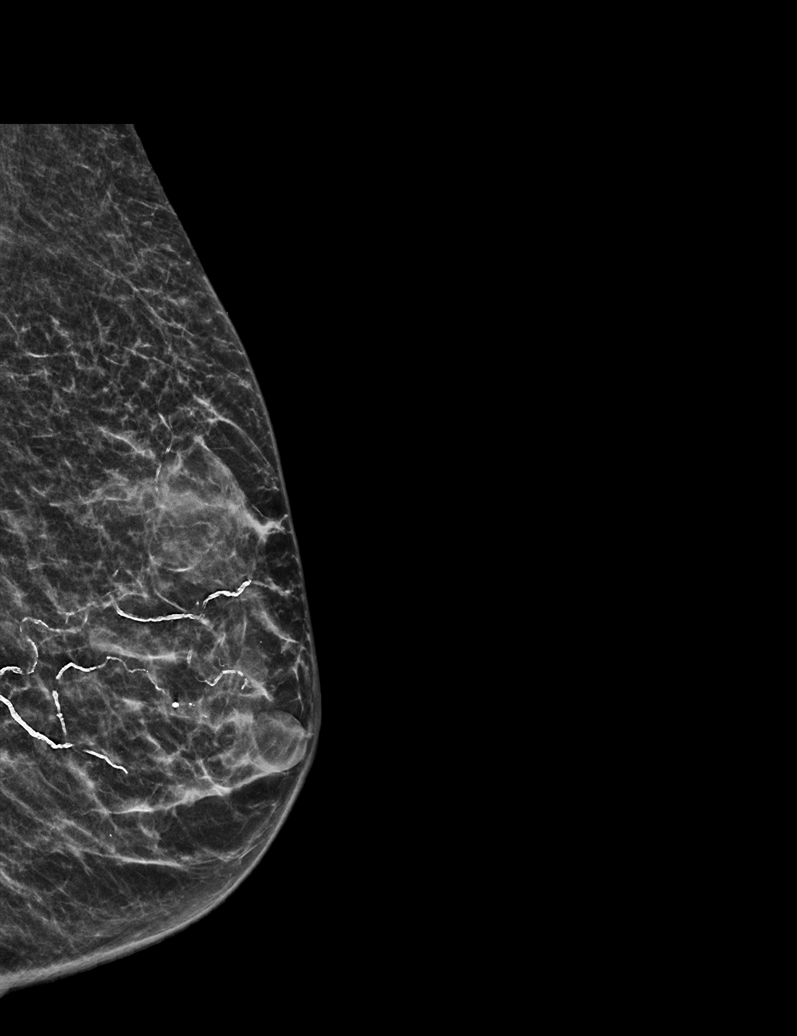

[L MLO synth-2D (2 of 2)]
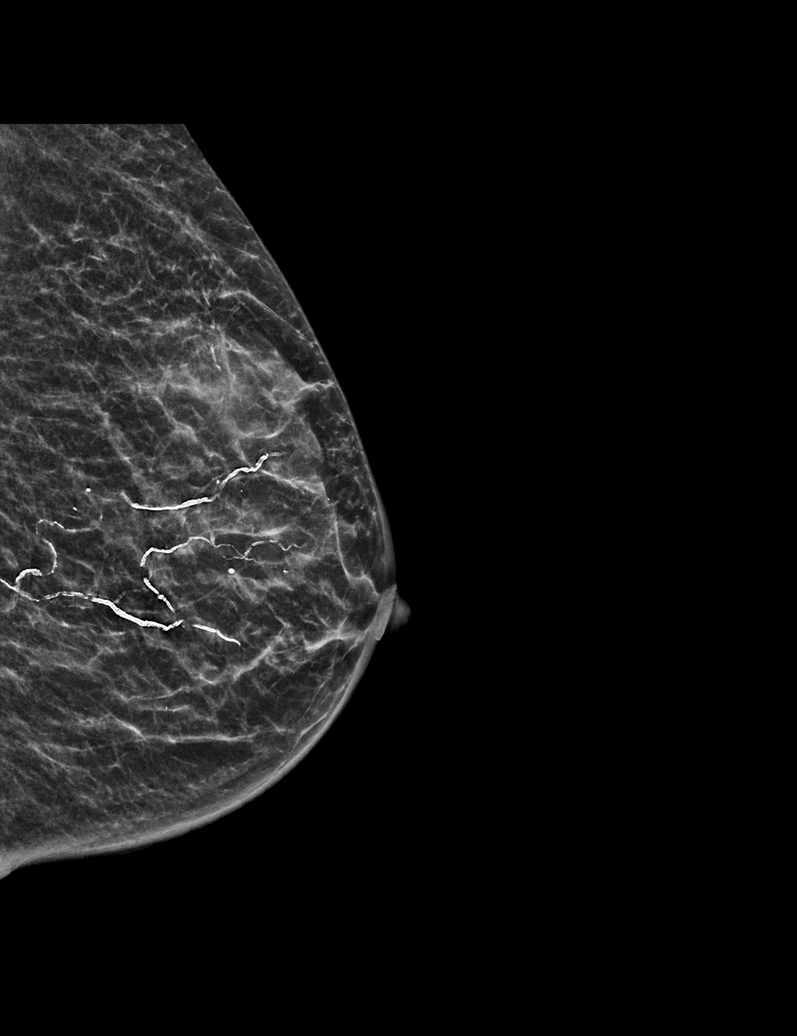

[R MLO synth-2D]
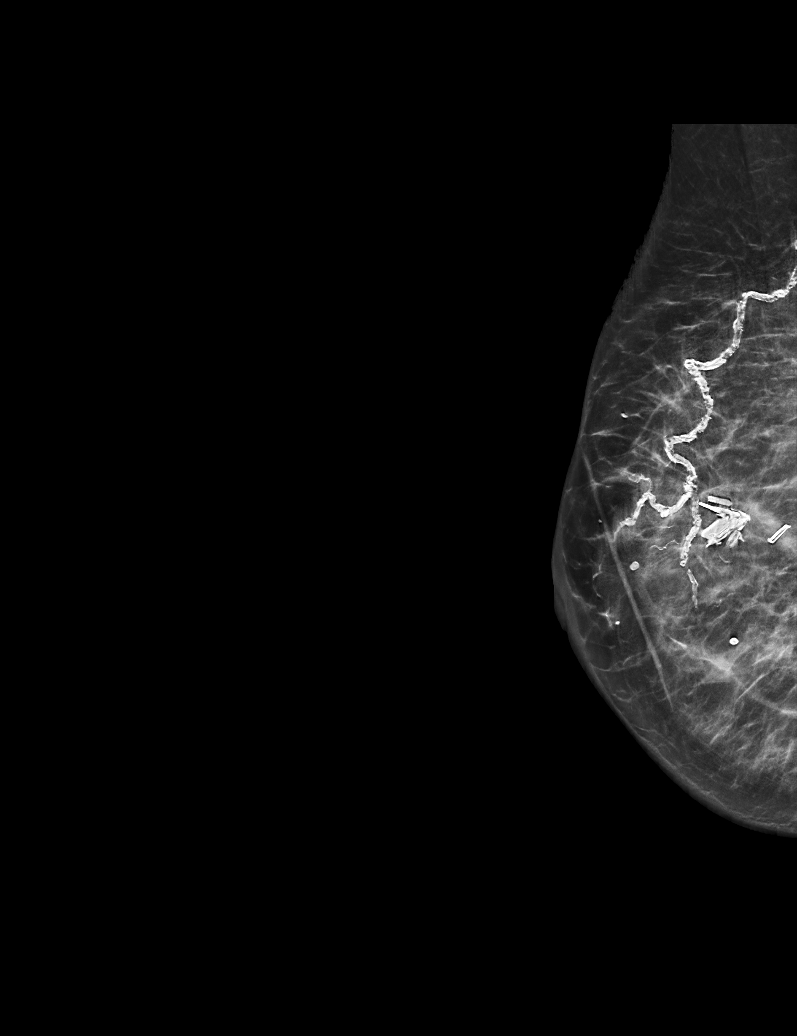

[L CC synth-2D]
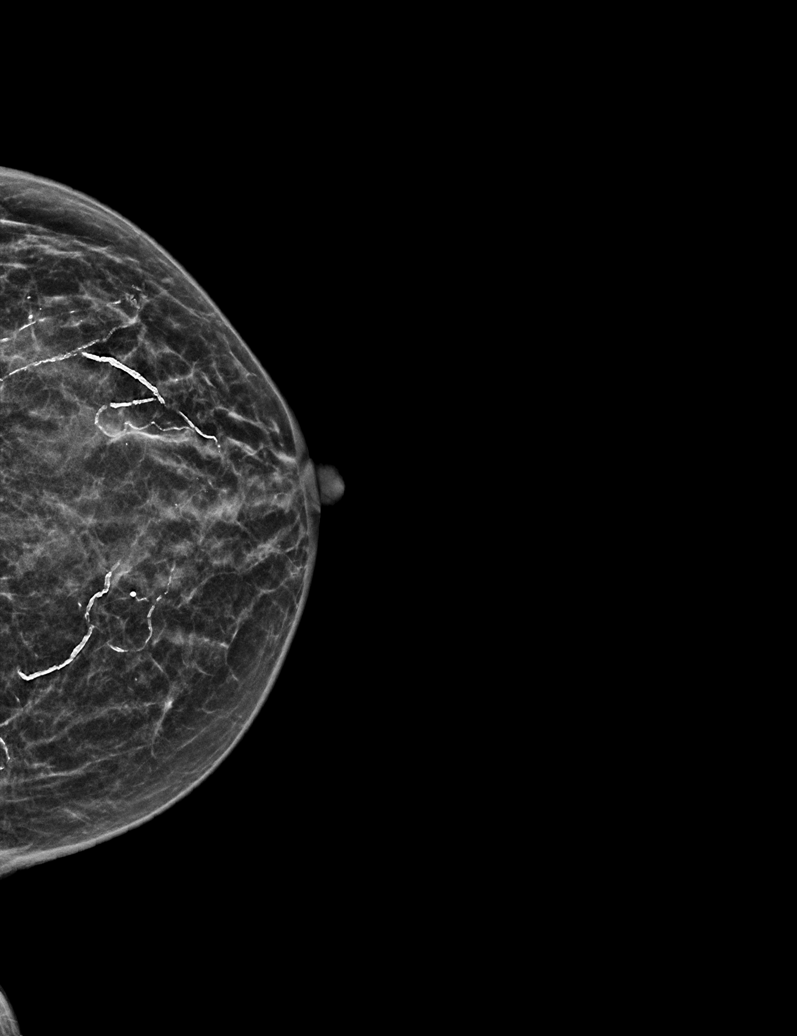

[R CC synth-2D]
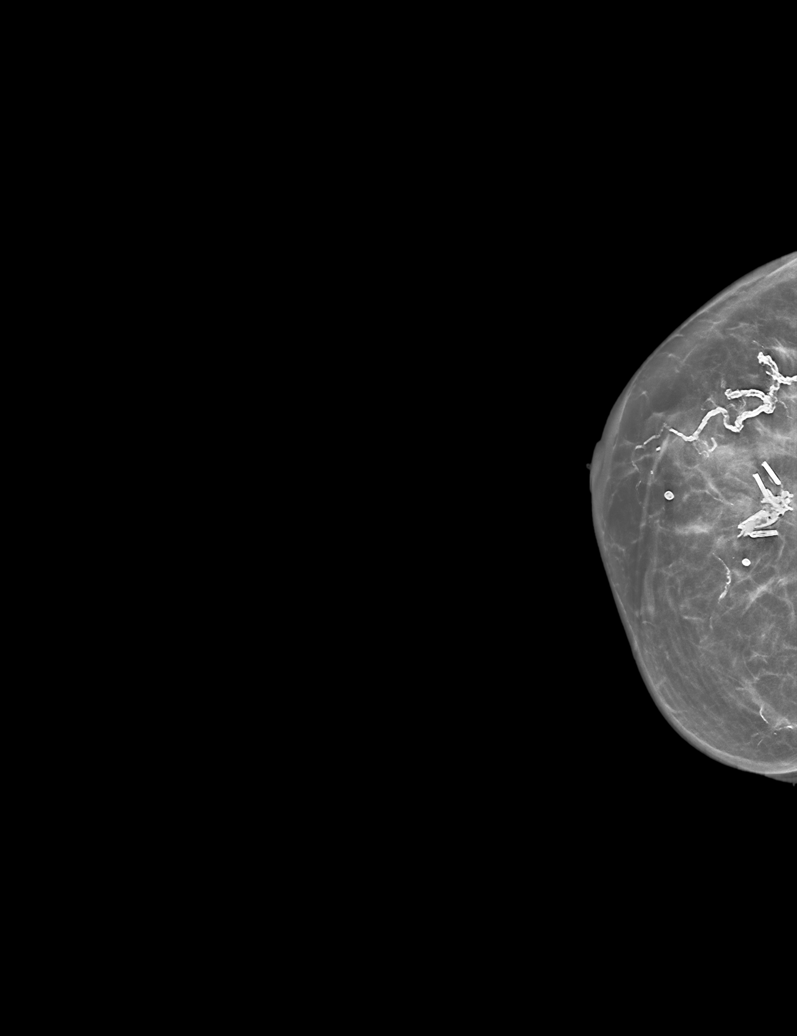

[R CC tomo · tomo slice 30/59.0]
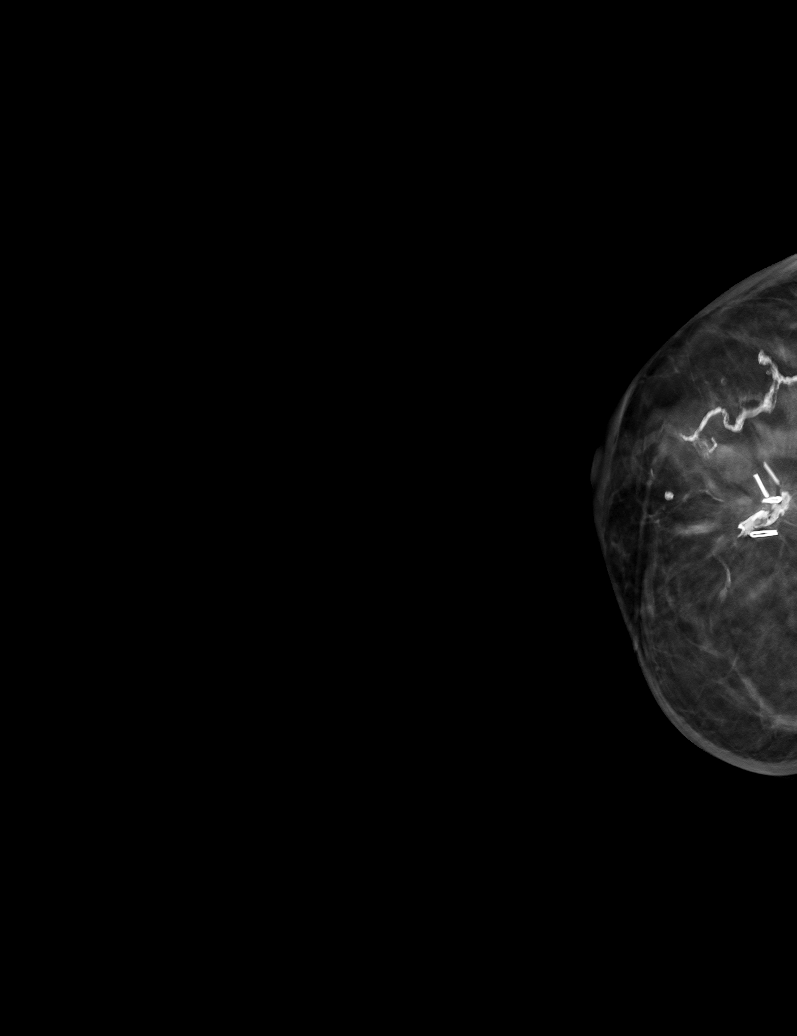

[6 of 30 positions shown; findings below may reference images not displayed]

ACR Breast Density Category c: The breast tissue is heterogeneously
dense, which may obscure small masses.
FINDINGS: There are no findings suspicious for malignancy. Images were
processed with CAD.
IMPRESSION: No mammographic evidence of malignancy. A result letter of this
screening mammogram will be mailed directly to the patient.

RECOMMENDATION:
Screening mammogram in one year. (Code:[5V])

BI-RADS CATEGORY  1: Negative.

## 2019-09-06 ENCOUNTER — Ambulatory Visit (INDEPENDENT_AMBULATORY_CARE_PROVIDER_SITE_OTHER): Payer: Medicare Other | Admitting: *Deleted

## 2019-09-06 DIAGNOSIS — I495 Sick sinus syndrome: Secondary | ICD-10-CM

## 2019-09-07 ENCOUNTER — Telehealth: Payer: Self-pay | Admitting: Emergency Medicine

## 2019-09-07 LAB — CUP PACEART REMOTE DEVICE CHECK
Battery Remaining Longevity: 73 mo
Battery Voltage: 3.01 V
Brady Statistic AP VP Percent: 3.02 %
Brady Statistic AP VS Percent: 84.87 %
Brady Statistic AS VP Percent: 1.87 %
Brady Statistic AS VS Percent: 10.24 %
Brady Statistic RA Percent Paced: 83.86 %
Brady Statistic RV Percent Paced: 4.45 %
Date Time Interrogation Session: 20210727182804
Implantable Lead Implant Date: 20180115
Implantable Lead Implant Date: 20180115
Implantable Lead Location: 753859
Implantable Lead Location: 753860
Implantable Lead Model: 5076
Implantable Lead Model: 5076
Implantable Pulse Generator Implant Date: 20180115
Lead Channel Impedance Value: 323 Ohm
Lead Channel Impedance Value: 380 Ohm
Lead Channel Impedance Value: 399 Ohm
Lead Channel Impedance Value: 437 Ohm
Lead Channel Pacing Threshold Amplitude: 0.625 V
Lead Channel Pacing Threshold Amplitude: 0.625 V
Lead Channel Pacing Threshold Pulse Width: 0.4 ms
Lead Channel Pacing Threshold Pulse Width: 0.4 ms
Lead Channel Sensing Intrinsic Amplitude: 1.25 mV
Lead Channel Sensing Intrinsic Amplitude: 1.25 mV
Lead Channel Sensing Intrinsic Amplitude: 9.625 mV
Lead Channel Sensing Intrinsic Amplitude: 9.625 mV
Lead Channel Setting Pacing Amplitude: 2 V
Lead Channel Setting Pacing Amplitude: 2.5 V
Lead Channel Setting Pacing Pulse Width: 0.4 ms
Lead Channel Setting Sensing Sensitivity: 2 mV

## 2019-09-07 NOTE — Telephone Encounter (Signed)
Alert due to AFL  That was ongoing at time of transmission 09/06/19 at [place]. Patient has not missed any doses of metoprolol 100 mg or Eliquis.Patient reports she is asymptomatic with AFL. She reports she is recovering from surgery and has had some pain r/t that. She has f/u with Dr Rayann Heman 09/14/19 at 1400 and wwas instructed to call the office if se has any change in condition.

## 2019-09-09 NOTE — Progress Notes (Signed)
Remote pacemaker transmission.   

## 2019-09-14 ENCOUNTER — Telehealth (INDEPENDENT_AMBULATORY_CARE_PROVIDER_SITE_OTHER): Payer: Medicare Other | Admitting: Internal Medicine

## 2019-09-14 ENCOUNTER — Encounter: Payer: Self-pay | Admitting: Internal Medicine

## 2019-09-14 ENCOUNTER — Other Ambulatory Visit: Payer: Self-pay

## 2019-09-14 VITALS — BP 94/62 | HR 99 | Ht 64.0 in | Wt 122.0 lb

## 2019-09-14 DIAGNOSIS — I4819 Other persistent atrial fibrillation: Secondary | ICD-10-CM

## 2019-09-14 DIAGNOSIS — D6869 Other thrombophilia: Secondary | ICD-10-CM

## 2019-09-14 DIAGNOSIS — I10 Essential (primary) hypertension: Secondary | ICD-10-CM

## 2019-09-14 DIAGNOSIS — I495 Sick sinus syndrome: Secondary | ICD-10-CM | POA: Diagnosis not present

## 2019-09-14 NOTE — Progress Notes (Signed)
Electrophysiology TeleHealth Note  Due to national recommendations of social distancing due to [place] 19, an audio telehealth visit is felt to be most appropriate for this patient at this time.  Verbal consent was obtained by me for the telehealth visit today.  The patient does not have capability for a virtual visit.  A phone visit is therefore required today.   Date:  09/14/2019   ID:  Megan Hester [place], DOB 1931/01/31, MRN [id number]  Location: patient's home  Provider location:  [place]  Evaluation Performed: Follow-up visit  PCP:  Megan Bunting, MD   Electrophysiologist:  Dr Megan Hester  Chief Complaint:  palpitations  History of Present Illness:    Megan Hester is a 86 y.o. female who presents via telehealth conferencing today.  Since last being seen in our clinic, the patient has declined.  She had small bowell resection in December.  She has lost a lot of weight.  Her husband died and she is grieving.  I have not seen her since 03/2018.  She continues to have persistent atrial arrhythmias for which she it not aware.  Today, she denies symptoms of palpitations, chest pain, shortness of breath,  lower extremity edema, dizziness, presyncope, or syncope.  The patient is otherwise without complaint today.     Past Medical History:  Diagnosis Date  . Arthritis    "all over my body" (02/25/2016)  . 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 GERD (gastroesophageal reflux disease)    hx; "when I was working"  . Hypertension   . Macular degeneration   . Paroxysmal atrial fibrillation (HCC)   . Presence of permanent cardiac pacemaker   . 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 APPLICATION OF WOUND VAC Bilateral 02/01/2019   Procedure: APPLICATION OF WOUND VAC;  Surgeon: Megan Boston, MD;  [facility]: [facility];  Service: General;  Laterality: Bilateral;  . BREAST BIOPSY Right   . BREAST  LUMPECTOMY Right 1999   RADIATION  . CARDIAC CATHETERIZATION  02/05/1999   HYPERDYNAMIC LEFT VENTRICLE WITH EF OF 75-80%  . CARDIOVERSION N/A 01/22/2015   Procedure: CARDIOVERSION;  Surgeon: Megan Spark, MD;  Location: [facility] ENDOSCOPY;  Service: Cardiovascular;  Laterality: N/A;  . CARDIOVERSION N/A 02/22/2015   Procedure: CARDIOVERSION;  Surgeon: Megan Perla, MD;  Location: [facility] ENDOSCOPY;  Service: Cardiovascular;  Laterality: N/A;  . DILATION AND CURETTAGE OF UTERUS    . ENUCLEATION Right 2012  . EP IMPLANTABLE DEVICE N/A 02/25/2016   Procedure: Pacemaker Implant;  Surgeon: Megan Grayer, MD;  Location: [facility] CV LAB;  Service: Cardiovascular;  Laterality: N/A;  . INSERT / REPLACE / REMOVE PACEMAKER    . LAPAROSCOPIC SMALL BOWEL RESECTION N/A 02/01/2019   Procedure: LAPAROSCOPIC SMALL BOWEL RESECTION WITH TAP BLOCK;  Surgeon: Megan Boston, MD;  [facility];  Service: General;  Laterality: N/A;  . LAPAROSCOPY N/A 02/01/2019   Procedure: LAPAROSCOPY DIAGNOSTIC;  Surgeon: Megan Boston, MD;  [facility];  Service: General;  Laterality: N/A;  . LYSIS OF ADHESION N/A 02/01/2019   Procedure: LYSIS OF ADHESION;  Surgeon: Megan Boston, MD;  [facility]: [facility];  Service: General;  Laterality: N/A;  . TONSILLECTOMY      Current Outpatient Medications  Medication Sig Dispense Refill  . acetaminophen (TYLENOL) 650 MG CR tablet Take 650 mg by mouth daily as needed for pain.    [name] Kitchen  cetirizine (ZYRTEC) 10 MG tablet Take 10 mg by mouth at bedtime.     . Cholecalciferol (VITAMIN D) 1000 UNITS capsule Take 1,000 Units by mouth at bedtime.     . clobetasol cream (TEMOVATE) 0.93 % Apply 1 application topically daily as needed (dermatitis).     [name] Kitchen digoxin (LANOXIN) 0.125 MG tablet TAKE 1 TABLET ONCE DAILY. 30 tablet 11  . Glucosamine-Chondroitin (GLUCOSAMINE CHONDR COMPLEX PO) Take 1 capsule by mouth daily.    . Hypromellose (ARTIFICIAL TEARS OP) Place 1 drop into the left eye 2 (two)  times daily.     . metoprolol tartrate (LOPRESSOR) 100 MG tablet TAKE 1 TABLET BY MOUTH TWICE DAILY. 60 tablet 11  . Multiple Vitamins-Minerals (CENTRUM SILVER PO) Take 1 capsule by mouth daily.     . Multiple Vitamins-Minerals (PRESERVISION/LUTEIN PO) Take 1 capsule by mouth 2 (two) times daily.     [name] Kitchen neomycin-polymyxin-dexameth (MAXITROL) 0.1 % OINT Place 1 application into the right eye at bedtime.    Vladimir Faster Glycol-Propyl Glycol (SYSTANE OP) Place 1 drop into the right eye 2 (two) times daily.     . polyethylene glycol (MIRALAX / GLYCOLAX) packet Take 17 g by mouth daily as needed for moderate constipation.     . Rivaroxaban (XARELTO) 15 MG TABS tablet Take 1 tablet (15 mg total) by mouth daily with supper. 30 tablet 5  . sertraline (ZOLOFT) 100 MG tablet Take 100 mg by mouth at bedtime.      No current facility-administered medications for this visit.    Allergies:   Shellfish allergy, Atropine, Codeine, Doxycycline, Fenoprofen calcium, Iodinated diagnostic agents, Isopto hyoscine [scopolamine], Levaquin [levofloxacin], Other, Oxycodone, Penicillins, Sulfa drugs cross reactors, Warfarin, Ceclor [cefaclor], and Tape   Social History:  The patient  reports that she has never smoked. She has never used smokeless tobacco. She reports current alcohol use. She reports that she does not use drugs.   ROS:  Please see the history of present illness.   All other systems are personally reviewed and negative.    Exam:    Vital Signs:  BP 94/62   Pulse 99   Ht 5\' 4"  (1.626 m)   Wt 122 lb (55.3 kg)   BMI 20.94 kg/m   Weak sounding, alert and conversant   Labs/Other Tests and Data Reviewed:    Recent Labs: 02/07/2019: TSH 1.551 02/14/2019: ALT 20; Magnesium 2.0 06/21/2019: BUN 15; Creatinine, Ser 0.66; Hemoglobin 9.8; Platelets 255; Potassium 4.7; Sodium 141   Wt Readings from Last 3 Encounters:  09/14/19 122 lb (55.3 kg)  06/21/19 122 lb 9.6 oz (55.6 kg)  03/16/19 130 lb 9.6 oz (59.2  kg)     Last device remote is reviewed from [facility] which reveals normal device function, atrial flutter is noted.  Histograms reveal that V rates are mostly controlled   ASSESSMENT & PLAN:    1.  Persistent afib/ atypical flutter/ atach Minimally symptomatic V rates are mostly controlled Dig level from 06/21/19 is reviewed.  Given her advanced age and fragility, I may consider stopping dig on return.  She requires close follow-up while on this medicine to avoid toxicity. We will avoid AADs and plan rate control long term. chads2vasc score is 4.  Continue xarelto  2. Sinus bradycardia Normal pacemaker function by remotes (reviewed today)  3. HTN Stable No change required today   Risks, benefits and potential toxicities for medications prescribed and/or refilled reviewed with patient today.   Follow-up:  3 months  Patient Risk:  after full review of this patients clinical status, I feel that they are at moderate risk at this time.  Today, I have spent 15 minutes with the patient with telehealth technology discussing arrhythmia management .    SignedThompson Grayer, MD  09/14/2019 2:15 PM     [place] [address] [phone] (office) [phone] (fax)

## 2019-09-27 ENCOUNTER — Telehealth: Payer: Self-pay | Admitting: *Deleted

## 2019-09-27 ENCOUNTER — Other Ambulatory Visit: Payer: Self-pay | Admitting: Internal Medicine

## 2019-09-27 NOTE — Telephone Encounter (Signed)
Xarelto 15mg  refill request received. Pt is 86 years old, weight-55.6kg on 06/21/2019, Crea-0.66 on 06/21/2019, last seen by Dr. Rayann Heman via Telemedicine on 09/14/2019, Diagnosis-Afib, CrCl-53.50ml/min; Dose is inappropriate based on dosing criteria. Dose was changed on 04/03/2019 from Xarelto 20mg  to 15mg  due to CrCl of  75ml/min.   Per Dr. Rayann Heman continue with Xarelto 15mg  daily. Refill sent from refill encounter.

## 2019-09-27 NOTE — Telephone Encounter (Addendum)
Xarelto 15mg  refill request received. Pt is 86 years old, weight-55.6kg on 06/21/2019, Crea-0.66 on 06/21/2019, last seen by Dr. Rayann Heman via Telemedicine on 09/14/2019, Diagnosis-Afib, CrCl-53.75ml/min; Dose is inappropriate based on dosing criteria. Dose was changed on 04/03/2019 from Xarelto 20mg  to 15mg  due to CrCl of  23ml/min. Will send a message to Dr. Rayann Heman regarding this. Please see the message below regarding continuing Xarelto 15mg  daily.      [name], MD  Mackinzee Roszak B, RN Given lability of her creatinine, I would keep at current dose of 15mg  daily.       Previous Messages   ----- Message -----  From: Marcos Eke, RN  Sent: 09/27/2019 10:57 AM EDT  To: [name], MD   Good Morning Dr. Rayann Heman,   Xarelto 15mg  refill request received for the patient. She is 86 years old, weight-55.6kg on 06/21/2019, Crea-0.66 on 06/21/2019,Diagnosis-Afib, CrCl-53.71ml/min; Dose is inappropriate based on dosing criteria. The dose was recently changed on 04/03/2019 from Xarelto 20mg  to 15mg  due to CrCl of 41ml/min. Please advise as patient is awaiting a refill.  Thank you.

## 2019-09-27 NOTE — Telephone Encounter (Deleted)
Xarelto 15mg  refill request received. Pt is 86 years old, weight-55.6kg on 06/21/2019, Crea-0.66 on 06/21/2019, last seen by Dr. Rayann Heman via Telemedicine on 09/14/2019, Diagnosis-Afib, CrCl-53.70ml/min; Dose is inappropriate based on dosing criteria. Dose was changed on 04/03/2019 from Xarelto 20mg  to 15mg  due to CrCl of  80ml/min. Will send a message to Dr. Rayann Heman regarding this.

## 2019-09-27 NOTE — Telephone Encounter (Signed)
-----   Message from [name], MD sent at 09/27/2019 12:31 PM EDT ----- Given lability of her creatinine, I would keep at current dose of 15mg  daily.    ----- Message ----- From: Marcos Eke, RN Sent: 09/27/2019  10:57 AM EDT To: [name], MD  Good Morning Dr. Rayann Heman,  Xarelto 15mg  refill request received for the patient. She is 86 years old, weight-55.6kg on 06/21/2019, Crea-0.66 on 06/21/2019,Diagnosis-Afib, CrCl-53.32ml/min; Dose is inappropriate based on dosing criteria. The dose was recently changed on 04/03/2019 from Xarelto 20mg  to 15mg  due to CrCl of  50ml/min. Please advise as patient is awaiting a refill.  Thank you.

## 2019-12-18 NOTE — Progress Notes (Signed)
Cardiology Office Note:    Date:  12/20/2019   ID:  Megan Hester [place], DOB [birth year], MRN [id number]  PCP:  Megan Bunting, MD  Cardiologist:  Megan Heinz, MD  Electrophysiologist:  Megan Grayer, MD   Referring MD: Megan Bunting, MD   Chief complaint: Atrial fibrillation  History of Present Illness:    Megan Hester is a 86 y.o. female with a hx of SBO with small bowel resection 02/01/2019, paroxysmal atrial fibrillation, sick sinus syndrome status post PPM, hypertension, type 2 diabetes who presents for follow-up.  She was admitted to [facility] with SBO and underwent small bowel resection 02/01/2019.  Her postoperative course was complicated by AF with RVR.  Given her hypotension, she was initially started on amiodarone drip.  She was transitioned off amiodarone due to elevated LFTs and started on metoprolol and digoxin for rate control.  Plan on discharge on 02/16/2019 was for 2 weeks of digoxin and then restart flecainide.  She reported that when she restarted flecainide 2 weeks post discharge, she developed extensive lower extremity edema and weakness/dizziness.  Her symptoms resolved with stopping flecainide.  Most recent device check 09/07/2019 showed AF burden 12%, rates controlled.  TTE 02/08/2019 showed LVEF 50 to 55%, normal RV function, indeterminate diastolic function, no significant valvular disease.   Since last clinic visit, she reports that she has been doing okay.  Denies any chest pain or dyspnea.  Reports did have some issues with palpitations last month but none recently.  Denies any lightheadedness or syncope.  Reports has been having issues with constipation recently but denies any nausea, vomiting, or diarrhea.  Reports had episode of epistaxis last week but otherwise denies any bleeding issues.     Wt Readings from Last 3 Encounters:  12/20/19 125 lb 6.4 oz (56.9 kg)  09/14/19 122 lb (55.3 kg)  06/21/19 122 lb 9.6 oz (55.6 kg)      Past  Medical History:  Diagnosis Date  . Arthritis    "all over my body" (02/25/2016)  . 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 GERD (gastroesophageal reflux disease)    hx; "when I was working"  . Hypertension   . Macular degeneration   . Paroxysmal atrial fibrillation (HCC)   . Presence of permanent cardiac pacemaker   . 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 APPLICATION OF WOUND VAC Bilateral 02/01/2019   Procedure: APPLICATION OF WOUND VAC;  Surgeon: [name], MD;  Location: [facility];  Service: General;  Laterality: Bilateral;  . BREAST BIOPSY Right   . BREAST LUMPECTOMY Right 1999   RADIATION  . CARDIAC CATHETERIZATION  02/05/1999   HYPERDYNAMIC LEFT VENTRICLE WITH EF OF 75-80%  . CARDIOVERSION N/A 01/22/2015   Procedure: CARDIOVERSION;  Surgeon: [name], MD;  Location: [facility] ENDOSCOPY;  Service: Cardiovascular;  Laterality: N/A;  . CARDIOVERSION N/A 02/22/2015   Procedure: CARDIOVERSION;  Surgeon: [name], MD;  Location: [facility] ENDOSCOPY;  Service: Cardiovascular;  Laterality: N/A;  . DILATION AND CURETTAGE OF UTERUS    . ENUCLEATION Right 2012  . EP IMPLANTABLE DEVICE N/A 02/25/2016   Procedure: Pacemaker Implant;  Surgeon: Megan Grayer, MD;  Location: [place] CV LAB;  Service: Cardiovascular;  Laterality: N/A;  . INSERT / REPLACE / REMOVE PACEMAKER    . LAPAROSCOPIC SMALL BOWEL RESECTION N/A 02/01/2019   Procedure: LAPAROSCOPIC SMALL BOWEL RESECTION WITH TAP  BLOCK;  Surgeon: [name], MD;  [facility];  Service: General;  Laterality: N/A;  . LAPAROSCOPY N/A 02/01/2019   Procedure: LAPAROSCOPY DIAGNOSTIC;  Surgeon: [name], MD;  [facility]: [facility];  Service: General;  Laterality: N/A;  . LYSIS OF ADHESION N/A 02/01/2019   Procedure: LYSIS OF ADHESION;  Surgeon: [name], MD;  [facility]: [facility];  Service: General;  Laterality: N/A;  . TONSILLECTOMY      Current  Medications: Current Meds  Medication Sig  . cetirizine (ZYRTEC) 10 MG tablet Take 10 mg by mouth at bedtime.   . Cholecalciferol (VITAMIN D) 1000 UNITS capsule Take 1,000 Units by mouth at bedtime.   . clobetasol cream (TEMOVATE) 6.96 % Apply 1 application topically daily as needed (dermatitis).   [name] Kitchen digoxin (LANOXIN) 0.125 MG tablet TAKE 1 TABLET ONCE DAILY.  [name] Kitchen Glucosamine-Chondroitin (GLUCOSAMINE CHONDR COMPLEX PO) Take 1 capsule by mouth daily.  . Hypromellose (ARTIFICIAL TEARS OP) Place 1 drop into the left eye 2 (two) times daily.   . metoprolol tartrate (LOPRESSOR) 100 MG tablet TAKE 1 TABLET BY MOUTH TWICE DAILY.  . Multiple Vitamins-Minerals (CENTRUM SILVER PO) Take 1 capsule by mouth daily.   . Multiple Vitamins-Minerals (PRESERVISION/LUTEIN PO) Take 1 capsule by mouth 2 (two) times daily.   [name] Kitchen neomycin-polymyxin-dexameth (MAXITROL) 0.1 % OINT Place 1 application into the right eye at bedtime.  Vladimir Faster Glycol-Propyl Glycol (SYSTANE OP) Place 1 drop into the right eye 2 (two) times daily.   . polyethylene glycol (MIRALAX / GLYCOLAX) packet Take 17 g by mouth daily as needed for moderate constipation.   . QUEtiapine (SEROQUEL) 25 MG tablet Take 25 mg by mouth at bedtime.  . sertraline (ZOLOFT) 100 MG tablet Take 100 mg by mouth at bedtime.   Alveda Reasons 15 MG TABS tablet TAKE 1 TABLET ONCE DAILY WITH SUPPER.     Allergies:   Shellfish allergy, Atropine, Codeine, Doxycycline, Fenoprofen calcium, Iodinated diagnostic agents, Isopto hyoscine [scopolamine], Levaquin [levofloxacin], Other, Oxycodone, Penicillins, Sulfa drugs cross reactors, Warfarin, Ceclor [cefaclor], and Tape   Social History   Socioeconomic History  . Marital status: Married    Spouse name: Not on file  . Number of children: 3  . Years of education: Not on file  . Highest education level: Not on file  Occupational History    Employer: RETIRED  Tobacco Use  . Smoking status: Never Smoker  . Smokeless tobacco:  Never Used  Vaping Use  . Vaping Use: Never used  Substance and Sexual Activity  . Alcohol use: Yes    Alcohol/week: 0.0 standard drinks    Comment: 02/25/2016 "glass of wine/month"  . Drug use: No  . Sexual activity: Yes    Birth control/protection: Post-menopausal    Comment: 1st intercourse 80 yo-1 partner  Other Topics Concern  . Not on file  Social History Narrative   Lives in [place].  Retired Solicitor.   Social Determinants of Health   Financial Resource Strain:   . Difficulty of Paying Living Expenses: Not on file  Food Insecurity:   . Worried About Charity fundraiser in the Last Year: Not on file  . Ran Out of Food in the Last Year: Not on file  Transportation Needs:   . Lack of Transportation (Medical): Not on file  . Lack of Transportation (Non-Medical): Not on file  Physical Activity:   . Days of Exercise per Week: Not on file  . Minutes of Exercise per Session: Not on file  Stress:   . Feeling of Stress : Not on file  Social Connections:   . Frequency of Communication with Friends and Family: Not on file  . Frequency of Social Gatherings with Friends and Family: Not on file  . Attends Religious Services: Not on file  . Active Member of Clubs or Organizations: Not on file  . Attends Archivist Meetings: Not on file  . Marital Status: Not on file     Family History: The patient's family history includes Diabetes in her brother; Heart attack in her father; Heart disease in her brother; Hypertension in her father; Lung cancer in her brother; Parkinsonism in her father; Stroke in her mother.  ROS:   Please see the history of present illness.    All other systems reviewed and are negative.  EKGs/Labs/Other Studies Reviewed:    The following studies were reviewed today:  EKG:  EKG is ordered today.  The ekg ordered today demonstrates atrial fibrillation, occasional ventricular paced complexes, rate 91  Recent Labs: 02/07/2019: TSH  1.551 02/14/2019: ALT 20; Magnesium 2.0 12/20/2019: BUN WILL FOLLOW; Creatinine, Ser WILL FOLLOW; Hemoglobin 9.1; Platelets 272; Potassium WILL FOLLOW; Sodium WILL FOLLOW  Recent Lipid Panel    Component Value Date/Time   TRIG 95 02/14/2019 0326    Physical Exam:    VS:  BP 108/66   Pulse 91   Ht 5\' 4"  (1.626 m)   Wt 125 lb 6.4 oz (56.9 kg)   SpO2 98%   BMI 21.52 kg/m     Wt Readings from Last 3 Encounters:  12/20/19 125 lb 6.4 oz (56.9 kg)  09/14/19 122 lb (55.3 kg)  06/21/19 122 lb 9.6 oz (55.6 kg)     GEN:  in no acute distress HEENT: Normal NECK: No JVD CARDIAC: Irregular, normal rate, no murmurs RESPIRATORY:  Clear to auscultation without rales, wheezing or rhonchi  ABDOMEN: Soft, non-tender MUSCULOSKELETAL:  No edema SKIN: Warm and dry NEUROLOGIC:  Alert and oriented x 3 PSYCHIATRIC:  Normal affect   ASSESSMENT:    1. Paroxysmal atrial fibrillation (HCC)   2. Current use of anticoagulant therapy   3. Encounter for monitoring digoxin therapy   4. Essential hypertension   5. Sick sinus syndrome [facility])    PLAN:    Atrial fibrillation: Had episodes of RVR after her small bowel resection in December.  Recent device interrogation shows AF burden 12%, with rates well controlled on metoprolol 100 mg twice daily and digoxin 0.125 mg daily.  She tried going back on flecainide but did not tolerate due to significant edema. -Continue metoprolol.  Currently on digoxin, may not be best long-term medication, will check digoxin level and BMET -Continue Xarelto  Hypertension: Appears well controlled. Continue metoprolol  SSS: s/p PPM: Follows with EP   RTC in 6 months    Medication Adjustments/Labs and Tests Ordered: Current medicines are reviewed at length with the patient today.  Concerns regarding medicines are outlined above.  Orders Placed This Encounter  Procedures  . CBC  . Basic metabolic panel  . Digoxin level  . EKG 12-Lead   No orders of the defined  types were placed in this encounter.   Patient Instructions  Medication Instructions:  Your physician recommends that you continue on your current medications as directed. Please refer to the Current Medication list given to you today.  *If you need a refill on your cardiac medications before your next appointment, please call your pharmacy*  Lab Work: BMET, CBC, Dig  level  If you have labs (blood work) drawn today and your tests are completely normal, you will receive your results only by: [name] Kitchen MyChart Message (if you have MyChart) OR . A paper copy in the mail If you have any lab test that is abnormal or we need to change your treatment, we will call you to review the results.  Follow-Up: At [facility], you and your health needs are our priority.  As part of our continuing mission to provide you with exceptional heart care, we have created designated Provider Care Teams.  These Care Teams include your primary Cardiologist (physician) and Advanced Practice Providers (APPs -  Physician Assistants and Nurse Practitioners) who all work together to provide you with the care you need, when you need it.  We recommend signing up for the patient portal called "MyChart".  Sign up information is provided on this After Visit Summary.  MyChart is used to connect with patients for Virtual Visits (Telemedicine).  Patients are able to view lab/test results, encounter notes, upcoming appointments, etc.  Non-urgent messages can be sent to your provider as well.   To learn more about what you can do with MyChart, go to [email or web address].    Your next appointment:   6 month(s)  The format for your next appointment:   In Person  Provider:   Oswaldo Milian, MD        Signed, Megan Heinz, MD  12/20/2019 11:30 PM    [place]

## 2019-12-20 ENCOUNTER — Ambulatory Visit: Payer: Medicare Other | Admitting: Cardiology

## 2019-12-20 ENCOUNTER — Encounter: Payer: Self-pay | Admitting: Cardiology

## 2019-12-20 ENCOUNTER — Other Ambulatory Visit: Payer: Self-pay

## 2019-12-20 VITALS — BP 108/66 | HR 91 | Ht 64.0 in | Wt 125.4 lb

## 2019-12-20 DIAGNOSIS — Z79899 Other long term (current) drug therapy: Secondary | ICD-10-CM

## 2019-12-20 DIAGNOSIS — Z7901 Long term (current) use of anticoagulants: Secondary | ICD-10-CM | POA: Diagnosis not present

## 2019-12-20 DIAGNOSIS — I10 Essential (primary) hypertension: Secondary | ICD-10-CM

## 2019-12-20 DIAGNOSIS — I480 Paroxysmal atrial fibrillation: Secondary | ICD-10-CM | POA: Diagnosis not present

## 2019-12-20 DIAGNOSIS — Z5181 Encounter for therapeutic drug level monitoring: Secondary | ICD-10-CM | POA: Diagnosis not present

## 2019-12-20 DIAGNOSIS — I495 Sick sinus syndrome: Secondary | ICD-10-CM

## 2019-12-20 NOTE — Patient Instructions (Signed)
Medication Instructions:  Your physician recommends that you continue on your current medications as directed. Please refer to the Current Medication list given to you today.  *If you need a refill on your cardiac medications before your next appointment, please call your pharmacy*  Lab Work: BMET, CBC, Dig level  If you have labs (blood work) drawn today and your tests are completely normal, you will receive your results only by: [name] Kitchen MyChart Message (if you have MyChart) OR . A paper copy in the mail If you have any lab test that is abnormal or we need to change your treatment, we will call you to review the results.  Follow-Up: At [facility], you and your health needs are our priority.  As part of our continuing mission to provide you with exceptional heart care, we have created designated Provider Care Teams.  These Care Teams include your primary Cardiologist (physician) and Advanced Practice Providers (APPs -  Physician Assistants and Nurse Practitioners) who all work together to provide you with the care you need, when you need it.  We recommend signing up for the patient portal called "MyChart".  Sign up information is provided on this After Visit Summary.  MyChart is used to connect with patients for Virtual Visits (Telemedicine).  Patients are able to view lab/test results, encounter notes, upcoming appointments, etc.  Non-urgent messages can be sent to your provider as well.   To learn more about what you can do with MyChart, go to [email or web address].    Your next appointment:   6 month(s)  The format for your next appointment:   In Person  Provider:   Oswaldo Milian, MD

## 2019-12-21 ENCOUNTER — Other Ambulatory Visit: Payer: Self-pay | Admitting: *Deleted

## 2019-12-21 DIAGNOSIS — R7989 Other specified abnormal findings of blood chemistry: Secondary | ICD-10-CM

## 2019-12-21 DIAGNOSIS — Z79899 Other long term (current) drug therapy: Secondary | ICD-10-CM

## 2019-12-21 DIAGNOSIS — E875 Hyperkalemia: Secondary | ICD-10-CM

## 2019-12-21 LAB — CBC
Hematocrit: 26.2 % — ABNORMAL LOW (ref 34.0–46.6)
Hemoglobin: 9.1 g/dL — ABNORMAL LOW (ref 11.1–15.9)
MCH: 37.3 pg — ABNORMAL HIGH (ref 26.6–33.0)
MCHC: 34.7 g/dL (ref 31.5–35.7)
MCV: 107 fL — ABNORMAL HIGH (ref 79–97)
Platelets: 272 10*3/uL (ref 150–450)
RBC: 2.44 x10E6/uL — CL (ref 3.77–5.28)
RDW: 13.8 % (ref 11.7–15.4)
WBC: 7 10*3/uL (ref 3.4–10.8)

## 2019-12-21 LAB — BASIC METABOLIC PANEL
BUN/Creatinine Ratio: 15 (ref 12–28)
BUN: 17 mg/dL (ref 8–27)
CO2: 25 mmol/L (ref 20–29)
Calcium: 9.4 mg/dL (ref 8.7–10.3)
Chloride: 107 mmol/L — ABNORMAL HIGH (ref 96–106)
Creatinine, Ser: 1.13 mg/dL — ABNORMAL HIGH (ref 0.57–1.00)
GFR calc Af Amer: 50 mL/min/{1.73_m2} — ABNORMAL LOW (ref >59)
GFR calc non Af Amer: 44 mL/min/{1.73_m2} — ABNORMAL LOW (ref >59)
Glucose: 113 mg/dL — ABNORMAL HIGH (ref 65–99)
Potassium: 5.5 mmol/L — ABNORMAL HIGH (ref 3.5–5.2)
Sodium: 144 mmol/L (ref 134–144)

## 2019-12-21 LAB — DIGOXIN LEVEL: Digoxin, Serum: 1.2 ng/mL — ABNORMAL HIGH (ref 0.5–0.9)

## 2019-12-22 ENCOUNTER — Ambulatory Visit: Payer: Medicare Other | Admitting: Internal Medicine

## 2019-12-22 ENCOUNTER — Encounter: Payer: Self-pay | Admitting: Internal Medicine

## 2019-12-22 ENCOUNTER — Other Ambulatory Visit: Payer: Self-pay

## 2019-12-22 VITALS — BP 110/64 | HR 76 | Ht 64.0 in | Wt 125.8 lb

## 2019-12-22 DIAGNOSIS — I495 Sick sinus syndrome: Secondary | ICD-10-CM | POA: Diagnosis not present

## 2019-12-22 DIAGNOSIS — I484 Atypical atrial flutter: Secondary | ICD-10-CM | POA: Diagnosis not present

## 2019-12-22 DIAGNOSIS — Z950 Presence of cardiac pacemaker: Secondary | ICD-10-CM

## 2019-12-22 DIAGNOSIS — I4819 Other persistent atrial fibrillation: Secondary | ICD-10-CM | POA: Diagnosis not present

## 2019-12-22 DIAGNOSIS — I10 Essential (primary) hypertension: Secondary | ICD-10-CM

## 2019-12-22 LAB — CUP PACEART INCLINIC DEVICE CHECK
Battery Remaining Longevity: 65 mo
Battery Voltage: 2.99 V
Brady Statistic AP VP Percent: 3.25 %
Brady Statistic AP VS Percent: 73.9 %
Brady Statistic AS VP Percent: 5.98 %
Brady Statistic AS VS Percent: 16.87 %
Brady Statistic RA Percent Paced: 71.14 %
Brady Statistic RV Percent Paced: 8.49 %
Date Time Interrogation Session: 20211111141851
Implantable Lead Implant Date: 20180115
Implantable Lead Implant Date: 20180115
Implantable Lead Location: 753859
Implantable Lead Location: 753860
Implantable Lead Model: 5076
Implantable Lead Model: 5076
Implantable Pulse Generator Implant Date: 20180115
Lead Channel Impedance Value: 323 Ohm
Lead Channel Impedance Value: 399 Ohm
Lead Channel Impedance Value: 437 Ohm
Lead Channel Impedance Value: 456 Ohm
Lead Channel Pacing Threshold Amplitude: 0.75 V
Lead Channel Pacing Threshold Pulse Width: 0.4 ms
Lead Channel Sensing Intrinsic Amplitude: 0.5 mV
Lead Channel Sensing Intrinsic Amplitude: 10.625 mV
Lead Channel Setting Pacing Amplitude: 2 V
Lead Channel Setting Pacing Amplitude: 2.5 V
Lead Channel Setting Pacing Pulse Width: 0.4 ms
Lead Channel Setting Sensing Sensitivity: 2 mV

## 2019-12-22 NOTE — Progress Notes (Signed)
PCP: [name], MD Primary Cardiologist: Dr Gilman Schmidt Primary EP: Dr Aurea Graff Megan Hester is a 86 y.o. female who presents today for routine electrophysiology followup.  Since last being seen in our clinic, the patient reports doing reasonably well.  Her husband died.  She is accompanied by her daughter (caregiver) today.  She is not very active.   Today, she denies symptoms of palpitations, chest pain, shortness of breath,  lower extremity edema, dizziness, presyncope, or syncope.  The patient is otherwise without complaint today.   Past Medical History:  Diagnosis Date  . Arthritis    "all over my body" (02/25/2016)  . 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 GERD (gastroesophageal reflux disease)    hx; "when I was working"  . Hypertension   . Macular degeneration   . Paroxysmal atrial fibrillation (HCC)   . Presence of permanent cardiac pacemaker   . 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 APPLICATION OF WOUND VAC Bilateral 02/01/2019   Procedure: APPLICATION OF WOUND VAC;  Surgeon: [name], MD;  Location: [facility];  Service: General;  Laterality: Bilateral;  . BREAST BIOPSY Right   . BREAST LUMPECTOMY Right 1999   RADIATION  . CARDIAC CATHETERIZATION  02/05/1999   HYPERDYNAMIC LEFT VENTRICLE WITH EF OF 75-80%  . CARDIOVERSION N/A 01/22/2015   Procedure: CARDIOVERSION;  Surgeon: [name], MD;  Location: [facility] ENDOSCOPY;  Service: Cardiovascular;  Laterality: N/A;  . CARDIOVERSION N/A 02/22/2015   Procedure: CARDIOVERSION;  Surgeon: [name], MD;  Location: [facility] ENDOSCOPY;  Service: Cardiovascular;  Laterality: N/A;  . DILATION AND CURETTAGE OF UTERUS    . ENUCLEATION Right 2012  . EP IMPLANTABLE DEVICE N/A 02/25/2016   Procedure: Pacemaker Implant;  Surgeon: [name], MD;  Location: [place] CV LAB;  Service: Cardiovascular;  Laterality: N/A;  . INSERT / REPLACE /  REMOVE PACEMAKER    . LAPAROSCOPIC SMALL BOWEL RESECTION N/A 02/01/2019   Procedure: LAPAROSCOPIC SMALL BOWEL RESECTION WITH TAP BLOCK;  Surgeon: [name], MD;  [facility];  Service: General;  Laterality: N/A;  . LAPAROSCOPY N/A 02/01/2019   Procedure: LAPAROSCOPY DIAGNOSTIC;  Surgeon: [name], MD;  [facility];  Service: General;  Laterality: N/A;  . LYSIS OF ADHESION N/A 02/01/2019   Procedure: LYSIS OF ADHESION;  Surgeon: [name], MD;  [facility];  Service: General;  Laterality: N/A;  . TONSILLECTOMY      ROS- all systems are reviewed and negatives except as per HPI above  Current Outpatient Medications  Medication Sig Dispense Refill  . cetirizine (ZYRTEC) 10 MG tablet Take 10 mg by mouth at bedtime.     . Cholecalciferol (VITAMIN D) 1000 UNITS capsule Take 1,000 Units by mouth at bedtime.     . clobetasol cream (TEMOVATE) 9.83 % Apply 1 application topically daily as needed (dermatitis).     . Glucosamine-Chondroitin (GLUCOSAMINE CHONDR COMPLEX PO) Take 1 capsule by mouth daily.    . Hypromellose (ARTIFICIAL TEARS OP) Place 1 drop into the left eye 2 (two) times daily.     . metoprolol tartrate (LOPRESSOR) 100 MG tablet TAKE 1 TABLET BY MOUTH TWICE DAILY. 60 tablet 11  . Multiple Vitamins-Minerals (CENTRUM SILVER PO) Take 1 capsule by mouth daily.     . Multiple Vitamins-Minerals (PRESERVISION/LUTEIN PO) Take 1 capsule by mouth 2 (two) times daily.     [name] Kitchen  neomycin-polymyxin-dexameth (MAXITROL) 0.1 % OINT Place 1 application into the right eye at bedtime.    Vladimir Faster Glycol-Propyl Glycol (SYSTANE OP) Place 1 drop into the right eye 2 (two) times daily.     . polyethylene glycol (MIRALAX / GLYCOLAX) packet Take 17 g by mouth daily as needed for moderate constipation.     . QUEtiapine (SEROQUEL) 25 MG tablet Take 25 mg by mouth at bedtime.    . sertraline (ZOLOFT) 100 MG tablet Take 100 mg by mouth at bedtime.     Alveda Reasons 15 MG TABS tablet TAKE 1  TABLET ONCE DAILY WITH SUPPER. 30 tablet 5   No current facility-administered medications for this visit.    Physical Exam: Vitals:   12/22/19 1359  BP: 110/64  Pulse: 76  SpO2: 97%  Weight: 125 lb 12.8 oz (57.1 kg)  Height: 5\' 4"  (1.626 m)    GEN- The patient is elderly and frail appearing, alert and oriented x 3 today.   Head- normocephalic, atraumatic Eyes-  R eye prosthesis in place Ears- hearing intact Oropharynx- clear Lungs- Clear to ausculation bilaterally, normal work of breathing Heart- irregular rate and rhythm, no murmurs, rubs or gallops, PMI not laterally displaced GI- soft, NT, ND, + BS Extremities- no clubbing, cyanosis, or edema  Wt Readings from Last 3 Encounters:  12/22/19 125 lb 12.8 oz (57.1 kg)  12/20/19 125 lb 6.4 oz (56.9 kg)  09/14/19 122 lb (55.3 kg)    EKG tracing ordered today is personally reviewed and shows afib, RBBB  Assessment and Plan:  1. Persistent afib/ atypical atrial flutter/ atach Doing well with rate controlled I do not plan to pursue sinus going forward. chads2vasc score is 4.  She is on xarelto Digoxin recently discontinued.  2. Sick sinus syndrome Normal pacemaker function No changes See paceart  3. HTN Stable No change required today  I spoke at length with patient and care giver today.  [name] MD, [facility] 12/22/2019 2:08 PM

## 2019-12-22 NOTE — Patient Instructions (Addendum)
Medication Instructions:  Stop digoxin   *If you need a refill on your cardiac medications before your next appointment, please call your pharmacy*  Lab Work: None ordered.  If you have labs (blood work) drawn today and your tests are completely normal, you will receive your results only by: [name] Kitchen MyChart Message (if you have MyChart) OR . A paper copy in the mail If you have any lab test that is abnormal or we need to change your treatment, we will call you to review the results.  Testing/Procedures: None ordered.  Follow-Up: At [facility], you and your health needs are our priority.  As part of our continuing mission to provide you with exceptional heart care, we have created designated Provider Care Teams.  These Care Teams include your primary Cardiologist (physician) and Advanced Practice Providers (APPs -  Physician Assistants and Nurse Practitioners) who all work together to provide you with the care you need, when you need it.  We recommend signing up for the patient portal called "MyChart".  Sign up information is provided on this After Visit Summary.  MyChart is used to connect with patients for Virtual Visits (Telemedicine).  Patients are able to view lab/test results, encounter notes, upcoming appointments, etc.  Non-urgent messages can be sent to your provider as well.   To learn more about what you can do with MyChart, go to [email or web address].    Your next appointment:   Your physician wants you to follow-up in: 1 year with Tommye Standard. You will receive a reminder letter in the mail two months in advance. If you don't receive a letter, please call our office to schedule the follow-up appointment.  Remote monitoring is used to monitor your Pacemaker from home. This monitoring reduces the number of office visits required to check your device to one time per year. It allows [place] to keep an eye on the functioning of your device to ensure it is working properly. You are  scheduled for a device check from home on 02/27/20. You may send your transmission at any time that day. If you have a wireless device, the transmission will be sent automatically. After your physician reviews your transmission, you will receive a postcard with your next transmission date.  Other Instructions:

## 2020-01-10 ENCOUNTER — Other Ambulatory Visit: Payer: Self-pay

## 2020-01-10 DIAGNOSIS — E875 Hyperkalemia: Secondary | ICD-10-CM

## 2020-01-10 DIAGNOSIS — Z79899 Other long term (current) drug therapy: Secondary | ICD-10-CM

## 2020-01-10 DIAGNOSIS — R7989 Other specified abnormal findings of blood chemistry: Secondary | ICD-10-CM

## 2020-01-11 LAB — BASIC METABOLIC PANEL
BUN/Creatinine Ratio: 22 (ref 12–28)
BUN: 19 mg/dL (ref 8–27)
CO2: 23 mmol/L (ref 20–29)
Calcium: 9.2 mg/dL (ref 8.7–10.3)
Chloride: 109 mmol/L — ABNORMAL HIGH (ref 96–106)
Creatinine, Ser: 0.85 mg/dL (ref 0.57–1.00)
GFR calc Af Amer: 71 mL/min/{1.73_m2} (ref >59)
GFR calc non Af Amer: 62 mL/min/{1.73_m2} (ref >59)
Glucose: 100 mg/dL — ABNORMAL HIGH (ref 65–99)
Potassium: 5.3 mmol/L — ABNORMAL HIGH (ref 3.5–5.2)
Sodium: 144 mmol/L (ref 134–144)

## 2020-01-12 ENCOUNTER — Other Ambulatory Visit: Payer: Self-pay

## 2020-01-12 DIAGNOSIS — Z79899 Other long term (current) drug therapy: Secondary | ICD-10-CM

## 2020-02-02 ENCOUNTER — Encounter: Payer: Self-pay | Admitting: *Deleted

## 2020-02-27 ENCOUNTER — Ambulatory Visit (INDEPENDENT_AMBULATORY_CARE_PROVIDER_SITE_OTHER): Payer: Medicare Other

## 2020-02-27 DIAGNOSIS — I495 Sick sinus syndrome: Secondary | ICD-10-CM | POA: Diagnosis not present

## 2020-03-01 LAB — CUP PACEART REMOTE DEVICE CHECK
Battery Remaining Longevity: 54 mo
Battery Voltage: 2.99 V
Brady Statistic AP VP Percent: 2.98 %
Brady Statistic AP VS Percent: 7.27 %
Brady Statistic AS VP Percent: 10.62 %
Brady Statistic AS VS Percent: 79.12 %
Brady Statistic RA Percent Paced: 7.33 %
Brady Statistic RV Percent Paced: 10 %
Date Time Interrogation Session: 20220117162107
Implantable Lead Implant Date: 20180115
Implantable Lead Implant Date: 20180115
Implantable Lead Location: 753859
Implantable Lead Location: 753860
Implantable Lead Model: 5076
Implantable Lead Model: 5076
Implantable Pulse Generator Implant Date: 20180115
Lead Channel Impedance Value: 323 Ohm
Lead Channel Impedance Value: 399 Ohm
Lead Channel Impedance Value: 418 Ohm
Lead Channel Impedance Value: 456 Ohm
Lead Channel Pacing Threshold Amplitude: 0.625 V
Lead Channel Pacing Threshold Amplitude: 0.75 V
Lead Channel Pacing Threshold Pulse Width: 0.4 ms
Lead Channel Pacing Threshold Pulse Width: 0.4 ms
Lead Channel Sensing Intrinsic Amplitude: 0.5 mV
Lead Channel Sensing Intrinsic Amplitude: 0.5 mV
Lead Channel Sensing Intrinsic Amplitude: 10.5 mV
Lead Channel Sensing Intrinsic Amplitude: 10.5 mV
Lead Channel Setting Pacing Amplitude: 2 V
Lead Channel Setting Pacing Amplitude: 2.5 V
Lead Channel Setting Pacing Pulse Width: 0.4 ms
Lead Channel Setting Sensing Sensitivity: 2 mV

## 2020-03-12 NOTE — Progress Notes (Signed)
Remote pacemaker transmission.   

## 2020-03-22 ENCOUNTER — Other Ambulatory Visit: Payer: Self-pay

## 2020-03-22 DIAGNOSIS — Z79899 Other long term (current) drug therapy: Secondary | ICD-10-CM

## 2020-03-23 LAB — BASIC METABOLIC PANEL
BUN/Creatinine Ratio: 16 (ref 12–28)
BUN: 15 mg/dL (ref 8–27)
CO2: 24 mmol/L (ref 20–29)
Calcium: 9.6 mg/dL (ref 8.7–10.3)
Chloride: 107 mmol/L — ABNORMAL HIGH (ref 96–106)
Creatinine, Ser: 0.95 mg/dL (ref 0.57–1.00)
GFR calc Af Amer: 62 mL/min/{1.73_m2} (ref >59)
GFR calc non Af Amer: 54 mL/min/{1.73_m2} — ABNORMAL LOW (ref >59)
Glucose: 83 mg/dL (ref 65–99)
Potassium: 5 mmol/L (ref 3.5–5.2)
Sodium: 143 mmol/L (ref 134–144)

## 2020-03-27 ENCOUNTER — Other Ambulatory Visit: Payer: Self-pay | Admitting: Internal Medicine

## 2020-03-27 NOTE — Telephone Encounter (Signed)
Pt last saw Dr Rayann Heman 12/22/19, last labs 03/22/20 Creat 0.95, age 87, weight 57.1kg, CrCl 37.61, based on CrCl pt is on appropriate dosage of Xarelto 15mg  QD for afib.  Will refill rx.

## 2020-04-04 ENCOUNTER — Other Ambulatory Visit: Payer: Self-pay | Admitting: Adult Health

## 2020-05-28 ENCOUNTER — Ambulatory Visit (INDEPENDENT_AMBULATORY_CARE_PROVIDER_SITE_OTHER): Payer: Medicare Other

## 2020-05-28 DIAGNOSIS — I495 Sick sinus syndrome: Secondary | ICD-10-CM

## 2020-05-30 LAB — CUP PACEART REMOTE DEVICE CHECK
Battery Remaining Longevity: 60 mo
Battery Voltage: 3 V
Brady Statistic AP VP Percent: 0.45 %
Brady Statistic AP VS Percent: 73.54 %
Brady Statistic AS VP Percent: 1.89 %
Brady Statistic AS VS Percent: 24.12 %
Brady Statistic RA Percent Paced: 56.88 %
Brady Statistic RV Percent Paced: 1.8 %
Date Time Interrogation Session: 20220418222807
Implantable Lead Implant Date: 20180115
Implantable Lead Implant Date: 20180115
Implantable Lead Location: 753859
Implantable Lead Location: 753860
Implantable Lead Model: 5076
Implantable Lead Model: 5076
Implantable Pulse Generator Implant Date: 20180115
Lead Channel Impedance Value: 323 Ohm
Lead Channel Impedance Value: 399 Ohm
Lead Channel Impedance Value: 399 Ohm
Lead Channel Impedance Value: 475 Ohm
Lead Channel Pacing Threshold Amplitude: 0.625 V
Lead Channel Pacing Threshold Amplitude: 0.625 V
Lead Channel Pacing Threshold Pulse Width: 0.4 ms
Lead Channel Pacing Threshold Pulse Width: 0.4 ms
Lead Channel Sensing Intrinsic Amplitude: 0.75 mV
Lead Channel Sensing Intrinsic Amplitude: 0.75 mV
Lead Channel Sensing Intrinsic Amplitude: 10.75 mV
Lead Channel Sensing Intrinsic Amplitude: 10.75 mV
Lead Channel Setting Pacing Amplitude: 2 V
Lead Channel Setting Pacing Amplitude: 2.5 V
Lead Channel Setting Pacing Pulse Width: 0.4 ms
Lead Channel Setting Sensing Sensitivity: 2 mV

## 2020-06-13 NOTE — Progress Notes (Signed)
Remote pacemaker transmission.   

## 2020-06-30 ENCOUNTER — Emergency Department (HOSPITAL_COMMUNITY)
Admission: EM | Admit: 2020-06-30 | Discharge: 2020-06-30 | Disposition: A | Discharge status: Home / Self Care | Payer: Medicare Other | Attending: Emergency Medicine | Admitting: Emergency Medicine

## 2020-06-30 ENCOUNTER — Emergency Department (HOSPITAL_COMMUNITY): Payer: Medicare Other

## 2020-06-30 ENCOUNTER — Other Ambulatory Visit: Payer: Self-pay

## 2020-06-30 DIAGNOSIS — Z9011 Acquired absence of right breast and nipple: Secondary | ICD-10-CM | POA: Diagnosis not present

## 2020-06-30 DIAGNOSIS — S0083XA Contusion of other part of head, initial encounter: Secondary | ICD-10-CM | POA: Insufficient documentation

## 2020-06-30 DIAGNOSIS — I10 Essential (primary) hypertension: Secondary | ICD-10-CM | POA: Insufficient documentation

## 2020-06-30 DIAGNOSIS — Z79899 Other long term (current) drug therapy: Secondary | ICD-10-CM | POA: Insufficient documentation

## 2020-06-30 DIAGNOSIS — Z950 Presence of cardiac pacemaker: Secondary | ICD-10-CM | POA: Diagnosis not present

## 2020-06-30 DIAGNOSIS — E119 Type 2 diabetes mellitus without complications: Secondary | ICD-10-CM | POA: Insufficient documentation

## 2020-06-30 DIAGNOSIS — I480 Paroxysmal atrial fibrillation: Secondary | ICD-10-CM | POA: Insufficient documentation

## 2020-06-30 DIAGNOSIS — Z7901 Long term (current) use of anticoagulants: Secondary | ICD-10-CM | POA: Diagnosis not present

## 2020-06-30 DIAGNOSIS — S51811A Laceration without foreign body of right forearm, initial encounter: Secondary | ICD-10-CM | POA: Insufficient documentation

## 2020-06-30 DIAGNOSIS — Z853 Personal history of malignant neoplasm of breast: Secondary | ICD-10-CM | POA: Insufficient documentation

## 2020-06-30 DIAGNOSIS — S0990XA Unspecified injury of head, initial encounter: Secondary | ICD-10-CM | POA: Diagnosis present

## 2020-06-30 DIAGNOSIS — W06XXXA Fall from bed, initial encounter: Secondary | ICD-10-CM | POA: Diagnosis not present

## 2020-06-30 DIAGNOSIS — W19XXXA Unspecified fall, initial encounter: Secondary | ICD-10-CM

## 2020-06-30 LAB — CBC
HCT: 26.1 % — ABNORMAL LOW (ref 36.0–46.0)
Hemoglobin: 8.6 g/dL — ABNORMAL LOW (ref 12.0–15.0)
MCH: 37.9 pg — ABNORMAL HIGH (ref 26.0–34.0)
MCHC: 33 g/dL (ref 30.0–36.0)
MCV: 115 fL — ABNORMAL HIGH (ref 80.0–100.0)
Platelets: 183 10*3/uL (ref 150–400)
RBC: 2.27 MIL/uL — ABNORMAL LOW (ref 3.87–5.11)
RDW: 16.4 % — ABNORMAL HIGH (ref 11.5–15.5)
WBC: 6.8 10*3/uL (ref 4.0–10.5)
nRBC: 0.3 % — ABNORMAL HIGH (ref 0.0–0.2)

## 2020-06-30 LAB — COMPREHENSIVE METABOLIC PANEL
ALT: 11 U/L (ref 0–44)
AST: 17 U/L (ref 15–41)
Albumin: 4.3 g/dL (ref 3.5–5.0)
Alkaline Phosphatase: 99 U/L (ref 38–126)
Anion gap: 9 (ref 5–15)
BUN: 18 mg/dL (ref 8–23)
CO2: 24 mmol/L (ref 22–32)
Calcium: 9.4 mg/dL (ref 8.9–10.3)
Chloride: 105 mmol/L (ref 98–111)
Creatinine, Ser: 0.79 mg/dL (ref 0.44–1.00)
GFR, Estimated: >60 mL/min (ref >60)
Glucose, Bld: 97 mg/dL (ref 70–99)
Potassium: 4.2 mmol/L (ref 3.5–5.1)
Sodium: 138 mmol/L (ref 135–145)
Total Bilirubin: 1.5 mg/dL — ABNORMAL HIGH (ref 0.3–1.2)
Total Protein: 7.1 g/dL (ref 6.5–8.1)

## 2020-06-30 LAB — TYPE AND SCREEN
ABO/RH(D): O NEG
Antibody Screen: NEGATIVE

## 2020-06-30 LAB — PROTIME-INR
INR: 1.5 — ABNORMAL HIGH (ref 0.8–1.2)
Prothrombin Time: 18.2 seconds — ABNORMAL HIGH (ref 11.4–15.2)

## 2020-06-30 MED ORDER — ACETAMINOPHEN 325 MG PO TABS
650.0000 mg | ORAL_TABLET | Freq: Once | ORAL | Status: AC
  Administered 2020-06-30: 650 mg via ORAL
  Filled 2020-06-30: qty 2

## 2020-06-30 NOTE — ED Notes (Signed)
Pt. In CT will get EKG when she returns to her assigned room.

## 2020-06-30 NOTE — ED Triage Notes (Addendum)
Patient reports she was in bed this morning and leaning over trying to reach an object, fell out of bed, hit head on floor, patient has large hematoma to back of head, , skin tear to right lower arm. Patient reports pain 5/10. Denies LOC, patient takes xarelto for Afib

## 2020-06-30 NOTE — ED Notes (Signed)
Pt aware urine sample needed. No urine in canister at this time.

## 2020-06-30 NOTE — ED Provider Notes (Signed)
[place] DEPT Provider Note   CSN: [id number] Arrival date & time: 06/30/20  3536     History Chief Complaint  Patient presents with  . Fall    Megan Hester is a 87 y.o. female with friend past medical history of arthritis, hypertension, paroxysmal A. fib on Xarelto, pacemaker that presents emerged department today for fall that happened an hour ago.  Patient states that she was trying to reach for her call bell, reached a little too far and fell over the rails of the bed.  Patient states that she fell onto the floor, primarily hitting her head.  Patient is primarily complaining of tailbone pain and slight headache.  Patient has been compliant with her Xarelto.  States that the pain is mild, also has skin tear to the right lower arm.  Patient states that she was in her normal health before this.  Denies any chest pain, extremity pain, shortness of breath, vision change, neck pain, abdominal pain, nausea, vomiting.  Remembers what happened, did not lose consciousness.  States that she was not on the floor for very long, son who was present in the room came to get her immediately when they heard her fall.  HPI     Past Medical History:  Diagnosis Date  . Arthritis    "all over my body" (02/25/2016)  . 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 GERD (gastroesophageal reflux disease)    hx; "when I was working"  . Hypertension   . Macular degeneration   . Paroxysmal atrial fibrillation (HCC)   . Presence of permanent cardiac pacemaker   . 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 SIRS (systemic inflammatory response syndrome) (HCC)   . Acute blood loss anemia   . Malnutrition of moderate degree 02/12/2019  . Closed loop SBO (small bowel obstruction)  02/01/2019  . Sepsis ([place]) 02/01/2019  . Chronic anticoagulation 02/01/2019  . Cardiac pacemaker in place - [employer]  [id number] Advisa DR MRI 02/01/2019  . Rectocele 02/01/2019  . DM (diabetes mellitus) ([place]) 02/01/2019  . Bowel obstruction ([place]) 02/01/2019  . Acute focal ischemia of small intestine ([place]) 02/01/2019  . Pseudophakia of left eye 04/03/2016  . Sick sinus syndrome ([place]) 02/25/2016  . Cancer ([place])   . Shortness of breath 11/17/2011  . 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 Nuclear cataract 04/10/2011  . Age-related macular degeneration 12/03/2010  . 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 History of enucleation of right eyeball 12/03/2010  . Hypertension 09/12/2010  . A-fib ([place]) 07/16/2010  . History of adenomatous polyp of colon 01/31/2001    Past Surgical History:  Procedure Laterality Date  . APPLICATION OF WOUND VAC Bilateral 02/01/2019   Procedure: APPLICATION OF WOUND VAC;  Surgeon: [name], MD;  Location: [facility] ORS;  Service: General;  Laterality: Bilateral;  . BREAST BIOPSY Right   . BREAST LUMPECTOMY Right 1999   RADIATION  . CARDIAC CATHETERIZATION  02/05/1999   HYPERDYNAMIC LEFT VENTRICLE WITH EF OF 75-80%  . CARDIOVERSION N/A 01/22/2015   Procedure: CARDIOVERSION;  Surgeon: [name], MD;  Location: [facility] ENDOSCOPY;  Service: Cardiovascular;  Laterality: N/A;  . CARDIOVERSION N/A 02/22/2015   Procedure: CARDIOVERSION;  Surgeon: [name], MD;  Location: [facility] ENDOSCOPY;  Service: Cardiovascular;  Laterality: N/A;  . DILATION AND CURETTAGE OF UTERUS    .  ENUCLEATION Right 2012  . EP IMPLANTABLE DEVICE N/A 02/25/2016   Procedure: Pacemaker Implant;  Surgeon: [name], MD;  Location: [place] CV LAB;  Service: Cardiovascular;  Laterality: N/A;  . INSERT / REPLACE / REMOVE PACEMAKER    . LAPAROSCOPIC SMALL BOWEL RESECTION N/A 02/01/2019   Procedure: LAPAROSCOPIC SMALL BOWEL RESECTION WITH TAP BLOCK;  Surgeon: [name], MD;  Location: [facility] ORS;  Service: General;  Laterality: N/A;  . LAPAROSCOPY N/A  02/01/2019   Procedure: LAPAROSCOPY DIAGNOSTIC;  Surgeon: [name], MD;  Location: [facility] ORS;  Service: General;  Laterality: N/A;  . LYSIS OF ADHESION N/A 02/01/2019   Procedure: LYSIS OF ADHESION;  Surgeon: [name], MD;  Location: [facility] ORS;  Service: General;  Laterality: N/A;  . TONSILLECTOMY       OB History    Gravida  3   Para  3   Term  3   Preterm      AB      Living  3     SAB      IAB      Ectopic      Multiple      Live Births              Family History  Problem Relation Age of Onset  . Hypertension Father   . Heart attack Father   . Parkinsonism Father   . Diabetes Brother   . Heart disease Brother   . Lung cancer Brother   . Stroke Mother     Social History   Tobacco Use  . Smoking status: Never Smoker  . Smokeless tobacco: Never Used  Vaping Use  . Vaping Use: Never used  Substance Use Topics  . Alcohol use: Yes    Alcohol/week: 0.0 standard drinks    Comment: 02/25/2016 "glass of wine/month"  . Drug use: No    Home Medications Prior to Admission medications   Medication Sig Start Date End Date Taking? Authorizing Provider  cetirizine (ZYRTEC) 10 MG tablet Take 10 mg by mouth at bedtime.     [provider]  Cholecalciferol (VITAMIN D) 1000 UNITS capsule Take 1,000 Units by mouth at bedtime.     [provider]  clobetasol cream (TEMOVATE) 3.78 % Apply 1 application topically daily as needed (dermatitis).     [provider]  Glucosamine-Chondroitin (GLUCOSAMINE CHONDR COMPLEX PO) Take 1 capsule by mouth daily.    [provider]  Hypromellose (ARTIFICIAL TEARS OP) Place 1 drop into the left eye 2 (two) times daily.     [provider]  metoprolol tartrate (LOPRESSOR) 100 MG tablet TAKE 1 TABLET BY MOUTH TWICE DAILY. 04/04/20   Donato Heinz, MD  Multiple Vitamins-Minerals (CENTRUM SILVER PO) Take 1 capsule by mouth daily.     [provider]  Multiple  Vitamins-Minerals (PRESERVISION/LUTEIN PO) Take 1 capsule by mouth 2 (two) times daily.     [provider]  neomycin-polymyxin-dexameth (MAXITROL) 0.1 % OINT Place 1 application into the right eye at bedtime.    [provider]  Polyethyl Glycol-Propyl Glycol (SYSTANE OP) Place 1 drop into the right eye 2 (two) times daily.     [provider]  polyethylene glycol (MIRALAX / GLYCOLAX) packet Take 17 g by mouth daily as needed for moderate constipation.     [provider]  QUEtiapine (SEROQUEL) 25 MG tablet Take 25 mg by mouth at bedtime. 12/11/19   [provider]  sertraline (ZOLOFT) 100 MG  tablet Take 100 mg by mouth at bedtime.  08/20/17   [provider]  XARELTO 15 MG TABS tablet TAKE 1 TABLET ONCE DAILY WITH SUPPER. 03/27/20   Allred, [name], MD    Allergies    Shellfish allergy, Atropine, Codeine, Doxycycline, Fenoprofen calcium, Iodinated diagnostic agents, Isopto hyoscine [scopolamine], Levaquin [levofloxacin], Other, Oxycodone, Penicillins, Sulfa drugs cross reactors, Warfarin, Ceclor [cefaclor], and Tape  Review of Systems   Review of Systems  Constitutional: Negative for chills, diaphoresis, fatigue and fever.  HENT: Negative for congestion, sore throat and trouble swallowing.   Eyes: Negative for pain and visual disturbance.  Respiratory: Negative for cough, shortness of breath and wheezing.   Cardiovascular: Negative for chest pain, palpitations and leg swelling.  Gastrointestinal: Negative for abdominal distention, abdominal pain, diarrhea, nausea and vomiting.  Genitourinary: Negative for difficulty urinating.  Musculoskeletal: Positive for arthralgias. Negative for back pain, neck pain and neck stiffness.  Skin: Negative for pallor.  Neurological: Positive for headaches. Negative for dizziness, speech difficulty and weakness.  Psychiatric/Behavioral: Negative for confusion.    Physical Exam Updated Vital Signs BP  119/66   Pulse 60   Temp 98.6 F (37 C) (Oral)   Resp 16   Ht 5\' 4"  (1.626 m)   Wt 59 kg   SpO2 97%   BMI 22.31 kg/m   Physical Exam Constitutional:      General: She is not in acute distress.    Appearance: Normal appearance. She is not ill-appearing, toxic-appearing or diaphoretic.  HENT:     Head: Normocephalic. Contusion present.      Comments: Patient with large hematoma to the back of head, no bleeding noted.  No open lacerations.    Mouth/Throat:     Mouth: Mucous membranes are moist.     Pharynx: Oropharynx is clear.  Eyes:     Extraocular Movements: Extraocular movements intact.     Pupils: Pupils are equal, round, and reactive to light.  Cardiovascular:     Rate and Rhythm: Normal rate and regular rhythm.  Pulmonary:     Effort: Pulmonary effort is normal. No respiratory distress.     Breath sounds: Normal breath sounds. No wheezing.     Comments: No ecchymosis on chest or chest tenderness Abdominal:     General: Abdomen is flat. There is no distension.     Tenderness: There is no abdominal tenderness.     Comments: No abdominal tenderness  Musculoskeletal:        General: No swelling, tenderness, deformity or signs of injury. Normal range of motion.     Cervical back: Normal range of motion. No rigidity or tenderness.       Back:     Comments: Normal range of motion to all extremities. No cervical, thoracic or lumbar midline tenderness.  There is midline tenderness to sacrum as seen in picture.  No objective numbness, no ecchymosis or bruising noted to that area.  Normal range of motion to hips, with normal leg raise bilaterally.  Normal range of motion to upper extremities with no tenderness to palpation of upper or lower extremities. Pelvis stable with no hip pain.  Lymphadenopathy:     Cervical: No cervical adenopathy.  Skin:    General: Skin is warm and dry.     Capillary Refill: Capillary refill takes less than 2 seconds.  Neurological:     General: No  focal deficit present.     Mental Status: She is alert and oriented to person, place, and time.  Comments: Alert. Clear speech. No facial droop. CNIII-XII grossly intact. Bilateral upper and lower extremities' sensation grossly intact. 5/5 symmetric strength with grip strength and with plantar and dorsi flexion bilaterally. Normal finger to nose bilaterally. Negative pronator drift.    Psychiatric:        Mood and Affect: Mood normal.        Behavior: Behavior normal.        Thought Content: Thought content normal.        Judgment: Judgment normal.     ED Results / Procedures / Treatments   Labs (all labs ordered are listed, but only abnormal results are displayed) Labs Reviewed  COMPREHENSIVE METABOLIC PANEL - Abnormal; Notable for the following components:      Result Value   Total Bilirubin 1.5 (*)    All other components within normal limits  CBC - Abnormal; Notable for the following components:   RBC 2.27 (*)    Hemoglobin 8.6 (*)    HCT 26.1 (*)    MCV 115.0 (*)    MCH 37.9 (*)    RDW 16.4 (*)    nRBC 0.3 (*)    All other components within normal limits  PROTIME-INR - Abnormal; Notable for the following components:   Prothrombin Time 18.2 (*)    INR 1.5 (*)    All other components within normal limits  TYPE AND SCREEN    EKG EKG Interpretation  Date/Time:  Saturday Jun 30 2020 10:38:43 EDT Ventricular Rate:  61 PR Interval:  162 QRS Duration: 130 QT Interval:  457 QTC Calculation: 461 R Axis:   34 Text Interpretation: Ectopic atrial rhythm Right bundle branch block since last tracing no significant change Confirmed by Daleen Bo [phone]) on 06/30/2020 1:15:48 PM   Radiology DG Lumbar Spine 2-3 Views  Result Date: 06/30/2020 CLINICAL DATA:  87 year old female status post fall from bed.  Pain. EXAM: LUMBAR SPINE - 2-3 VIEW COMPARISON:  CT Abdomen and Pelvis 02/01/2019. FINDINGS: Normal lumbar segmentation. Stable lumbar lordosis. Stable vertebral height  and alignment widespread advanced lumbar endplate degeneration, but relatively mild disc space loss. Lower lumbar facet degeneration. Visible sacrum and SI joints appear grossly intact. No acute osseous abnormality identified. Mild Calcified aortic atherosclerosis. Negative visible bowel gas pattern. IMPRESSION: No acute osseous abnormality identified in the lumbar spine. Electronically Signed   By: Genevie Ann M.D.   On: 06/30/2020 10:52   DG Sacrum/Coccyx  Result Date: 06/30/2020 CLINICAL DATA:  87 year old female status post fall from bed. Pain. EXAM: SACRUM AND COCCYX - 2+ VIEW COMPARISON:  Pelvis series today.  CT Abdomen and Pelvis 02/01/2019. FINDINGS: Visible pelvis appears intact. SI joints appear symmetric. On the lateral view the sacrum and coccygeal segments appear stable compared to the 2020 CT. Lumbar spine detailed separately. IMPRESSION: No acute fracture or dislocation identified about the sacrum and coccyx. Electronically Signed   By: Genevie Ann M.D.   On: 06/30/2020 10:53   CT Head Wo Contrast  Result Date: 06/30/2020 CLINICAL DATA:  87 year old female with head and neck injury following fall. EXAM: CT HEAD WITHOUT CONTRAST CT CERVICAL SPINE WITHOUT CONTRAST TECHNIQUE: Multidetector CT imaging of the head and cervical spine was performed following the standard protocol without intravenous contrast. Multiplanar CT image reconstructions of the cervical spine were also generated. COMPARISON:  04/29/2004 brain MR. FINDINGS: CT HEAD FINDINGS Brain: No evidence of acute infarction, hemorrhage, hydrocephalus, extra-axial collection or mass lesion/mass effect. Mild atrophy and mild chronic small-vessel white matter ischemic changes are  again noted. Vascular: Carotid atherosclerotic calcifications are noted. Skull: Normal. Negative for fracture or focal lesion. Sinuses/Orbits: Small amount of fluid in the sphenoid sinus noted. The remainder of the paranasal sinuses and mastoid air cells are clear. Other:  Posterior scalp soft tissue swelling is noted. CT CERVICAL SPINE FINDINGS Alignment: 2 mm anterolisthesis of C3 on C4 and C4 on C5 noted, likely degenerative. Skull base and vertebrae: No acute fracture. No primary bone lesion or focal pathologic process. Soft tissues and spinal canal: No prevertebral fluid or swelling. No visible canal hematoma. Disc levels: Moderate degenerative disc disease/spondylosis at C5-6 noted contributing to bony biforaminal narrowing. Mild to moderate multilevel facet arthropathy identified. Upper chest: No acute abnormality Other: None IMPRESSION: 1. No evidence of acute intracranial abnormality. Mild atrophy and chronic small-vessel white matter ischemic changes. 2. Posterior scalp soft tissue swelling without fracture. 3. No static evidence of acute injury to the cervical spine. Degenerative changes as described. Electronically Signed   By: Margarette Canada M.D.   On: 06/30/2020 10:39   CT Cervical Spine Wo Contrast  Result Date: 06/30/2020 CLINICAL DATA:  87 year old female with head and neck injury following fall. EXAM: CT HEAD WITHOUT CONTRAST CT CERVICAL SPINE WITHOUT CONTRAST TECHNIQUE: Multidetector CT imaging of the head and cervical spine was performed following the standard protocol without intravenous contrast. Multiplanar CT image reconstructions of the cervical spine were also generated. COMPARISON:  04/29/2004 brain MR. FINDINGS: CT HEAD FINDINGS Brain: No evidence of acute infarction, hemorrhage, hydrocephalus, extra-axial collection or mass lesion/mass effect. Mild atrophy and mild chronic small-vessel white matter ischemic changes are again noted. Vascular: Carotid atherosclerotic calcifications are noted. Skull: Normal. Negative for fracture or focal lesion. Sinuses/Orbits: Small amount of fluid in the sphenoid sinus noted. The remainder of the paranasal sinuses and mastoid air cells are clear. Other: Posterior scalp soft tissue swelling is noted. CT CERVICAL SPINE  FINDINGS Alignment: 2 mm anterolisthesis of C3 on C4 and C4 on C5 noted, likely degenerative. Skull base and vertebrae: No acute fracture. No primary bone lesion or focal pathologic process. Soft tissues and spinal canal: No prevertebral fluid or swelling. No visible canal hematoma. Disc levels: Moderate degenerative disc disease/spondylosis at C5-6 noted contributing to bony biforaminal narrowing. Mild to moderate multilevel facet arthropathy identified. Upper chest: No acute abnormality Other: None IMPRESSION: 1. No evidence of acute intracranial abnormality. Mild atrophy and chronic small-vessel white matter ischemic changes. 2. Posterior scalp soft tissue swelling without fracture. 3. No static evidence of acute injury to the cervical spine. Degenerative changes as described. Electronically Signed   By: Margarette Canada M.D.   On: 06/30/2020 10:39   DG Pelvis Portable  Result Date: 06/30/2020 CLINICAL DATA:  87 year old female status post fall out of bed. Pain. EXAM: PORTABLE PELVIS 1-2 VIEWS COMPARISON:  CT Abdomen and Pelvis 02/01/2019. FINDINGS: Femoral heads remain normally located. Pelvis appears stable and intact. Grossly stable and intact proximal femurs. Negative SI joints. Chronic lumbar disc and endplate degeneration. Chronic pelvic phleboliths. Negative visible bowel gas. IMPRESSION: No acute fracture or dislocation identified about the pelvis. If there is lateralizing hip pain then dedicated hip series is recommended. Electronically Signed   By: Genevie Ann M.D.   On: 06/30/2020 10:50   DG Chest Port 1 View  Result Date: 06/30/2020 CLINICAL DATA:  87 year old female status post fall from bed. Atrial fibrillation. EXAM: PORTABLE CHEST 1 VIEW COMPARISON:  Portable chest 02/09/2019 and earlier. FINDINGS: Portable AP semi upright view at 1030 hours. Stable cardiomegaly and  mediastinal contours. Chronic left chest AICD. Improved lung volumes since 2020. Chronic right chest or breast surgical clips.  Allowing for portable technique the lungs are clear. Visualized tracheal air column is within normal limits. Chronic proximal humerus deformities. No acute osseous abnormality identified. Visible bowel-gas pattern within normal limits. IMPRESSION: Stable cardiomegaly. No acute cardiopulmonary abnormality. Electronically Signed   By: Genevie Ann M.D.   On: 06/30/2020 10:59    Procedures Procedures   Medications Ordered in ED Medications  acetaminophen (TYLENOL) tablet 650 mg (650 mg Oral Given 06/30/20 1221)    ED Course  I have reviewed the triage vital signs and the nursing notes.  Pertinent labs & imaging results that were available during my care of the patient were reviewed by me and considered in my medical decision making (see chart for details).    MDM Rules/Calculators/A&P                          Warren Kugelman is a 87 y.o. female with friend past medical history of arthritis, hypertension, paroxysmal A. fib on Xarelto, pacemaker that presents emerged department today for mechanical fall that happened an hour ago.  Will obtain plain films, patient appears well, with normal neuro exam.  Patient normally ambulates with walker.  Lives at home with son who primarily takes care of her.  Work-up today unremarkable with plain films and CT head and neck without any acute abnormality.  Hemoglobin here today 8.6, this appears to be baseline for patient.  Patient is hemodynamically stable.  Patient has been observed for 5 hours, on repeat neuro exam there is no abnormality or acute changes.  Discussed with Dr. Eulis Foster about timeframe for observation, we decided that she has been observed sufficiently and incident happened 6 hours ago.  Discussed neuro checks with son in depth, and stricter precautions given, patient will follow up with PCP.  After Tylenol administration, patient states that she is much better, is able to ambulate without any difficulty.  Doubt need for further emergent work up at  this time. I explained the diagnosis and have given explicit precautions to return to the ER including for any other new or worsening symptoms. The patient understands and accepts the medical plan as it's been dictated and I have answered their questions. Discharge instructions concerning home care and prescriptions have been given. The patient is STABLE and is discharged to home in good condition.  I discussed this case with my attending physician who cosigned this note including patient's presenting symptoms, physical exam, and planned diagnostics and interventions. Attending physician stated agreement with plan or made changes to plan which were implemented.   Attending physician assessed patient at bedside.  Final Clinical Impression(s) / ED Diagnoses Final diagnoses:  None    Rx / DC Orders ED Discharge Orders    None       Alfredia Client, PA-C 06/30/20 1829    Daleen Bo, MD 07/01/20 [phone]

## 2020-06-30 NOTE — ED Notes (Signed)
Pt. Was able to ambulate with a walker and stated she felt fine.

## 2020-06-30 NOTE — Discharge Instructions (Signed)
  You were evaluated in the Emergency Department and after careful evaluation, we did not find any emergent condition requiring admission or further testing in the hospital.   Your exam/testing today was overall reassuring.  Please follow-up with your primary care in the next couple of days.  If she has any new worsening concerning symptoms that we spoke about such as numbness or tingling, weakness, facial droop, difficulty speaking, unresponsiveness need to connect to the ER soon as possible.  Please see the attached instructions.  She can take Tylenol as directed on the bottle for pain.  Please return to the Emergency Department if you experience any worsening of your condition.  Thank you for allowing [place] to be a part of your care. Please speak to your pharmacist about any new medications prescribed today in regards to side effects or interactions with other medications.   Get help right away if: You have: A very bad headache that is not helped by medicine. Trouble walking or weakness in your arms and legs. Clear or bloody fluid coming from your nose or ears. Changes in how you see (vision). A seizure. More confusion or more grumpy moods. Your symptoms get worse. You are sleepier than normal and have trouble staying awake. You lose your balance. The black centers of your eyes (pupils) change in size. Your speech is slurred. Your dizziness gets worse. You vomit.

## 2020-06-30 NOTE — ED Provider Notes (Signed)
  Face-to-face evaluation   History: She presents for evaluation of injuries from fall when she [facility] bed while trying to reach her call bell.  She landed on the floor and struck her head.  She complains of pain in head, as well as her lower back.  She denies weakness, dizziness, chest pain or abdominal pain.  Physical exam: Elderly patient, who is alert.  She is frail.  Contusion posterior head.  No large joint deformity.  She is communicative and follows commands.  Medical screening examination/treatment/procedure(s) were conducted as a shared visit with non-physician practitioner(s) and myself.  I personally evaluated the patient during the encounter    Daleen Bo, MD 07/01/20 [phone]

## 2020-07-31 ENCOUNTER — Encounter: Payer: Self-pay | Admitting: Physical Therapy

## 2020-07-31 ENCOUNTER — Ambulatory Visit: Payer: Medicare Other | Attending: Internal Medicine | Admitting: Physical Therapy

## 2020-07-31 ENCOUNTER — Other Ambulatory Visit: Payer: Self-pay

## 2020-07-31 DIAGNOSIS — M6281 Muscle weakness (generalized): Secondary | ICD-10-CM | POA: Diagnosis present

## 2020-07-31 DIAGNOSIS — R296 Repeated falls: Secondary | ICD-10-CM | POA: Diagnosis present

## 2020-07-31 DIAGNOSIS — R2689 Other abnormalities of gait and mobility: Secondary | ICD-10-CM | POA: Diagnosis present

## 2020-07-31 NOTE — Patient Instructions (Signed)
Access Code: [email or web address] URL: [email or web address] Date: 07/31/2020 Prepared by: Hilda Blades  Exercises Seated Knee Extension with Resistance - 1 x daily - 7 x weekly - 2 sets - 10 reps Seated Hamstring Curls with Resistance - 1 x daily - 7 x weekly - 2 sets - 10 reps Seated March with Resistance - 1 x daily - 7 x weekly - 2 sets - 10 reps Standing Hip Abduction with Resistance at Thighs - 1 x daily - 7 x weekly - 2 sets - 10 reps Heel rises with counter support - 1 x daily - 7 x weekly - 2 sets - 10 reps Standing Romberg to 1/2 Tandem Stance - 1 x daily - 7 x weekly - 3 reps - 30 hold Sit to Stand - 1 x daily - 7 x weekly - 2 sets - 10 reps

## 2020-07-31 NOTE — Therapy (Signed)
[place], [place], [address] Phone: [phone]   Fax:  [phone]  Physical Therapy Evaluation  Patient Details  Name: Megan Hester MRN: [id number] Date of Birth: [birth year] Referring Provider (PT): [name], MD   Encounter Date: 07/31/2020   PT End of Session - 07/31/20 1345     Visit Number 1    Number of Visits 8    Date for PT Re-Evaluation 09/25/20    Authorization Type UHC MCR    Authorization Time Period FOTO by 6th, KX by 15th    Progress Note Due on Visit 10    PT Start Time 1230   patient arrived late   PT Stop Time 1300    PT Time Calculation (min) 30 min    Equipment Utilized During Treatment Gait belt    Activity Tolerance Patient tolerated treatment well    Behavior During Therapy [facility] for tasks assessed/performed             Past Medical History:  Diagnosis Date   Arthritis    "all over my body" (02/25/2016)   Cancer of right breast ([place]) 1999   DCIS   GERD (gastroesophageal reflux disease)    hx; "when I was working"   Hypertension    Macular degeneration    Paroxysmal atrial fibrillation ([place])    Presence of permanent cardiac pacemaker    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    Past Surgical History:  Procedure Laterality Date   APPLICATION OF WOUND VAC Bilateral 02/01/2019   Procedure: APPLICATION OF WOUND VAC;  Surgeon: [name], MD;  [facility];  Service: General;  Laterality: Bilateral;   BREAST BIOPSY Right    BREAST LUMPECTOMY Right 1999   RADIATION   CARDIAC CATHETERIZATION  02/05/1999   HYPERDYNAMIC LEFT VENTRICLE WITH EF OF 75-80%   CARDIOVERSION N/A 01/22/2015   Procedure: CARDIOVERSION;  Surgeon: [name], MD;  Location: [place];  Service: Cardiovascular;  Laterality: N/A;   CARDIOVERSION N/A 02/22/2015   Procedure: CARDIOVERSION;  Surgeon: [name], MD;  Location: [facility];   Service: Cardiovascular;  Laterality: N/A;   DILATION AND CURETTAGE OF UTERUS     ENUCLEATION Right 2012   EP IMPLANTABLE DEVICE N/A 02/25/2016   Procedure: Pacemaker Implant;  Surgeon: [name], MD;  Location: [facility];  Service: Cardiovascular;  Laterality: N/A;   INSERT / REPLACE / REMOVE PACEMAKER     LAPAROSCOPIC SMALL BOWEL RESECTION N/A 02/01/2019   Procedure: LAPAROSCOPIC SMALL BOWEL RESECTION WITH TAP BLOCK;  Surgeon: [name], MD;  [facility];  Service: General;  Laterality: N/A;   LAPAROSCOPY N/A 02/01/2019   Procedure: LAPAROSCOPY DIAGNOSTIC;  Surgeon: [name], MD;  [facility];  Service: General;  Laterality: N/A;   LYSIS OF ADHESION N/A 02/01/2019   Procedure: LYSIS OF ADHESION;  Surgeon: [name], MD;  [facility];  Service: General;  Laterality: N/A;   TONSILLECTOMY      There were no vitals filed for this visit.    Subjective Assessment - 07/31/20 1233     Subjective Patient/daughter reports balance problems and weakness resulting falls that stems from previous hospital stays and injuries such a fracture arm as a result of falls. Patient also has visual deficits with only one real eye and visual deficits in the remaining eye. Patient was having HHPT and that was working on balance and strengthening, but was told  she could get more benfit from OPPT. Patient also notes a sipped disc at one point that is doing quite well. Recently she did have a fall out of bed when reach for bell, and daughter states that have placed a large pad around the bed to cushion any future falls. Daughter reports that when patient falls, she is unable to even attempt to catch herself.    Patient is accompained by: Family member   daughter who assists as historian   Pertinent History Visual deficits, pacemaker    Limitations Lifting;Standing;House hold activities;Walking    Patient Stated Goals Improve balance and strength to reduce fall risk    Currently in  Pain? No/denies                [facility] PT Assessment - 07/31/20 0001       Assessment   Medical Diagnosis Unsteadiness on feet    Referring Provider (PT) [name], MD    Onset Date/Surgical Date --   ongoing > 1 year   Next MD Visit Not scheduled    Prior Therapy HHPT      Precautions   Precautions Fall      Restrictions   Weight Bearing Restrictions No      Balance Screen   Has the patient fallen in the past 6 months Yes    How many times? Around 3 falls - 1 bad fall and 2 minor falls    Has the patient had a decrease in activity level because of a fear of falling?  Yes    Is the patient reluctant to leave their home because of a fear of falling?  Yes      [place] residence    Type of [place] Access Stairs to enter    Entrance Stairs-Rails Right;Left;Cannot reach both    Home Layout Two level    Alternate Level Stairs-Rails Right;Left;Can reach both      Prior Function   Level of Independence Independent with basic ADLs;Needs assistance with homemaking    Leisure None reported      Cognition   Overall Cognitive Status Within Functional Limits for tasks assessed      Observation/Other Assessments   Observations Patient appears in no apparent distress    Focus on Therapeutic Outcomes (FOTO)  NA due to time constraints      ROM / Strength   AROM / PROM / Strength Strength      Strength   Strength Assessment Site Hip;Knee    Right/Left Hip Right;Left    Right Hip Flexion 4/5    Right Hip Extension 4-/5    Right Hip ABduction 4-/5    Left Hip Flexion 4/5    Left Hip Extension 4-/5    Left Hip ABduction 4-/5    Right/Left Knee Right;Left    Right Knee Flexion 4+/5    Right Knee Extension 4+/5    Left Knee Flexion 4+/5    Left Knee Extension 4+/5      Ambulation/Gait   Ambulation/Gait Yes    Ambulation/Gait Assistance 6: Modified independent (Device/Increase time)    Gait velocity 5 meter gait  speed: 5 sec    Gait Comments Patient with unsteady gait      Standardized Balance Assessment   Standardized Balance Assessment Timed Up and Go Test;Five Times Sit to Stand    Five times sit to stand comments  19      Timed Up  and Go Test   TUG Normal TUG    Normal TUG (seconds) 20                        Objective measurements completed on examination: See above findings.       [facility] Adult PT Treatment/Exercise - 07/31/20 0001       Neuro Re-ed    Neuro Re-ed Details  1/2 tandem stance x 30 sec      Exercises   Exercises Knee/Hip      Knee/Hip Exercises: Standing   Heel Raises 10 reps    Hip Abduction 10 reps    Abduction Limitations yellow around knees      Knee/Hip Exercises: Seated   Long Arc Quad 10 reps    Long Arc Quad Limitations yellow    Marching 10 reps    Marching Limitations yellow    Hamstring Curl 10 reps    Hamstring Limitations yellow    Sit to [employer] 10 reps                    PT Education - 07/31/20 1317     Education Details Exam findings, POC, HEP, using assistive device for mobility    Person(s) Educated Patient;Child(ren)    Methods Explanation;Demonstration;Tactile cues;Verbal cues;Handout    Comprehension Verbalized understanding;Returned demonstration;Verbal cues required;Tactile cues required;Need further instruction              PT Short Term Goals - 07/31/20 1350       PT SHORT TERM GOAL #1   Title Patient will be I with initial HEP to progress with PT    Time 4    Period Weeks    Status New    Target Date 08/28/20      PT SHORT TERM GOAL #2   Title PT will assess FOTO by 2nd visit and review with patient by 3rd visit to understand extected progression    Time 4    Period Weeks    Status New    Target Date 08/28/20               PT Long Term Goals - 07/31/20 1352       PT LONG TERM GOAL #1   Title Patient will be I with final HEP to maintain progress from PT    Time 8    Period  Weeks    Status New    Target Date 09/25/20      PT LONG TERM GOAL #2   Title Patient will demonstrate bilateral knee strength = 5/5 MMT and hip strength >/= 4/5 MMT to improve steadiness with gait    Time 8    Period Weeks    Status New    Target Date 09/25/20      PT LONG TERM GOAL #3   Title Patient will be able to perform 5xSTS in </= 14 sec to indicate improve strength reduce fall risk    Time 8    Period Weeks    Status New    Target Date 09/25/20      PT LONG TERM GOAL #4   Title Patient will exhibit TUG </= 13.5 sec to indicate reduce risk of falls    Time 8    Period Weeks    Status New    Target Date 09/25/20      PT LONG TERM GOAL #5   Title Establish LTG for BERG and FOTO when  assessed                    Plan - 07/31/20 1340     Clinical Impression Statement Patient presents to PT with report of balance and strengthe deficits with frequent falls. Evaluation limited due to time constraints. Patient does exhibit generalized weakness of BLEs and tests such as 5xSTS and TUG indicate high fall risk, as well as patient exhibiting unsteady gait. The visual deficits patient presents with a likely playing a large role in balance and gait defcits resulting in falls. Patient provided exercises to initiate strengthening and balance training and she would benefit from continued skilled PT to progress her strength, mobility, and balance in order to reduce fall risk and maximize her functional ability.    Personal Factors and Comorbidities Age;Fitness;Past/Current Experience;Time since onset of injury/illness/exacerbation;Comorbidity 3+    Comorbidities visual deficits, HTN, pacemaker, history of cancer, DM    Examination-Activity Limitations Locomotion Level;Stand;Stairs;Lift;Dressing;Carry;Bathing    Examination-Participation Restrictions Cleaning;Community Activity;Shop;Laundry;Meal Prep    Stability/Clinical Decision Making Evolving/Moderate complexity    Clinical  Decision Making Moderate    Rehab Potential Good    PT Frequency 1x / week    PT Duration 8 weeks    PT Treatment/Interventions ADLs/Self Care Home Management;Aquatic Therapy;Cryotherapy;Electrical Stimulation;Iontophoresis 4mg /ml Dexamethasone;Moist Heat;Traction;Ultrasound;Neuromuscular re-education;Balance training;Therapeutic exercise;Therapeutic activities;Functional mobility training;Stair training;Gait training;Patient/family education;Manual techniques;Dry needling;Passive range of motion;Taping;Vasopneumatic Device;Spinal Manipulations;Joint Manipulations    PT Next Visit Plan Review HEP and progress PRN, assess FOTO and perform BERG, progress general LE strengthening, balance training moving outside base of support, practice getting up from floor    PT Home Exercise Plan [id number]    Consulted and Agree with Plan of Care Patient;Family member/caregiver    Family Member Consulted Daughter             Patient will benefit from skilled therapeutic intervention in order to improve the following deficits and impairments:  Abnormal gait, Decreased range of motion, Difficulty walking, Decreased activity tolerance, Decreased balance, Postural dysfunction, Decreased strength  Visit Diagnosis: Other abnormalities of gait and mobility  Muscle weakness (generalized)  Repeated falls     Problem List Patient Active Problem List   Diagnosis Date Noted   SIRS (systemic inflammatory response syndrome) (HCC)    Acute blood loss anemia    Malnutrition of moderate degree 02/12/2019   Closed loop SBO (small bowel obstruction)  02/01/2019   Sepsis ([place]) 02/01/2019   Chronic anticoagulation 02/01/2019   Cardiac pacemaker in place - [employer]onic [id number] Advisa DR MRI 02/01/2019   Rectocele 02/01/2019   DM (diabetes mellitus) ([place]) 02/01/2019   Bowel obstruction ([place]) 02/01/2019   Acute focal ischemia of small intestine ([place]) 02/01/2019   Pseudophakia of left eye 04/03/2016   Sick sinus  syndrome ([place]) 02/25/2016   Cancer ([place])    Shortness of breath 11/17/2011   Advanced nonexudative age-related macular degeneration of left eye without subfoveal involvement 04/10/2011   Nuclear cataract 04/10/2011   Age-related macular degeneration 12/03/2010   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 [place], [place], [address] Phone: [phone]   Fax:  [phone]  Name: Etrulia Zarr MRN: [id number] Date of Birth: 19-Feb-1930

## 2020-08-08 ENCOUNTER — Ambulatory Visit: Payer: Medicare Other

## 2020-08-13 NOTE — Progress Notes (Deleted)
Cardiology Office Note:    Date:  08/17/2020   ID:  Megan Hester [place], DOB March 20, 1930, MRN [id number]  PCP:  Megan Bunting, MD  Cardiologist:  Megan Heinz, MD  Electrophysiologist:  Megan Grayer, MD   Referring MD: Megan Bunting, MD   Chief complaint: Atrial fibrillation  History of Present Illness:    Megan Hester is a 87 y.o. female with a hx of SBO with small bowel resection 02/01/2019, paroxysmal atrial fibrillation, sick sinus syndrome status post PPM, hypertension, type 2 diabetes who presents for follow-up.  She was admitted to [facility] with SBO and underwent small bowel resection 02/01/2019.  Her postoperative course was complicated by AF with RVR.  Given her hypotension, she was initially started on amiodarone drip.  She was transitioned off amiodarone due to elevated LFTs and started on metoprolol and digoxin for rate control.  Plan on discharge on 02/16/2019 was for 2 weeks of digoxin and then restart flecainide.  She reported that when she restarted flecainide 2 weeks post discharge, she developed extensive lower extremity edema and weakness/dizziness.  Her symptoms resolved with stopping flecainide.  Most recent device check 09/07/2019 showed AF burden 12%, rates controlled.  TTE 02/08/2019 showed LVEF 50 to 55%, normal RV function, indeterminate diastolic function, no significant valvular disease.   Since last clinic visit, she present to the ED on 06/30/2020 after fall.  She did not hit her head.  Head CT was unremarkable.  she reports that she has been doing okay.  Denies any chest pain or dyspnea.  Reports did have some issues with palpitations last month but none recently.  Denies any lightheadedness or syncope.  Reports has been having issues with constipation recently but denies any nausea, vomiting, or diarrhea.  Reports had episode of epistaxis last week but otherwise denies any bleeding issues.     Wt Readings from Last 3 Encounters:  08/17/20  133 lb 9.6 oz (60.6 kg)  06/30/20 130 lb (59 kg)  12/22/19 125 lb 12.8 oz (57.1 kg)      Past Medical History:  Diagnosis Date   Arthritis    "all over my body" (02/25/2016)   Cancer of right breast ([place]) 1999   DCIS   GERD (gastroesophageal reflux disease)    hx; "when I was working"   Hypertension    Macular degeneration    Paroxysmal atrial fibrillation ([place])    Presence of permanent cardiac pacemaker    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    Past Surgical History:  Procedure Laterality Date   APPLICATION OF WOUND VAC Bilateral 02/01/2019   Procedure: APPLICATION OF WOUND VAC;  Surgeon: Megan Boston, MD;  [facility];  Service: General;  Laterality: Bilateral;   BREAST BIOPSY Right    BREAST LUMPECTOMY Right 1999   RADIATION   CARDIAC CATHETERIZATION  02/05/1999   HYPERDYNAMIC LEFT VENTRICLE WITH EF OF 75-80%   CARDIOVERSION N/A 01/22/2015   Procedure: CARDIOVERSION;  Surgeon: Megan Spark, MD;  Location: [facility];  Service: Cardiovascular;  Laterality: N/A;   CARDIOVERSION N/A 02/22/2015   Procedure: CARDIOVERSION;  Surgeon: Megan Perla, MD;  Location: [facility];  Service: Cardiovascular;  Laterality: N/A;   DILATION AND CURETTAGE OF UTERUS     ENUCLEATION Right 2012   EP IMPLANTABLE DEVICE N/A 02/25/2016   Procedure: Pacemaker Implant;  Surgeon: Megan Grayer, MD;  Location: [facility];  Service: Cardiovascular;  Laterality: N/A;   INSERT / REPLACE /  REMOVE PACEMAKER     LAPAROSCOPIC SMALL BOWEL RESECTION N/A 02/01/2019   Procedure: LAPAROSCOPIC SMALL BOWEL RESECTION WITH TAP BLOCK;  Surgeon: Megan Boston, MD;  [facility];  Service: General;  Laterality: N/A;   LAPAROSCOPY N/A 02/01/2019   Procedure: LAPAROSCOPY DIAGNOSTIC;  Surgeon: Megan Boston, MD;  [facility];  Service: General;  Laterality: N/A;   LYSIS OF ADHESION N/A 02/01/2019   Procedure: LYSIS OF ADHESION;  Surgeon: Megan Boston, MD;  [facility];  Service: General;  Laterality: N/A;   TONSILLECTOMY      Current Medications: Current Meds  Medication Sig   cetirizine (ZYRTEC) 10 MG tablet Take 10 mg by mouth at bedtime.   Cholecalciferol (VITAMIN D) 1000 UNITS capsule Take 1,000 Units by mouth at bedtime.    clobetasol cream (TEMOVATE) 4.31 % Apply 1 application topically daily as needed (dermatitis).    Glucosamine-Chondroitin (GLUCOSAMINE CHONDR COMPLEX PO) Take 1 capsule by mouth daily.   Hypromellose (ARTIFICIAL TEARS OP) Place 1 drop into the left eye 2 (two) times daily.    metoprolol tartrate (LOPRESSOR) 100 MG tablet TAKE 1 TABLET BY MOUTH TWICE DAILY.   Multiple Vitamins-Minerals (PRESERVISION/LUTEIN PO) Take 1 capsule by mouth 2 (two) times daily.    Polyethyl Glycol-Propyl Glycol (SYSTANE OP) Place 1 drop into the right eye 2 (two) times daily.    polyethylene glycol (MIRALAX / GLYCOLAX) packet Take 17 g by mouth daily as needed for moderate constipation.    QUEtiapine (SEROQUEL) 25 MG tablet Take 25 mg by mouth at bedtime.   sertraline (ZOLOFT) 100 MG tablet Take 100 mg by mouth at bedtime.    XARELTO 15 MG TABS tablet TAKE 1 TABLET ONCE DAILY WITH SUPPER.   [DISCONTINUED] Multiple Vitamins-Minerals (CENTRUM SILVER PO) Take 1 capsule by mouth daily.    [DISCONTINUED] neomycin-polymyxin-dexameth (MAXITROL) 0.1 % OINT Place 1 application into the right eye at bedtime.     Allergies:   Shellfish allergy, Atropine, Codeine, Doxycycline, Fenoprofen calcium, Iodinated diagnostic agents, Isopto hyoscine [scopolamine], Levaquin [levofloxacin], Other, Oxycodone, Penicillins, Sulfa drugs cross reactors, Warfarin, Ceclor [cefaclor], and Tape   Social History   Socioeconomic History   Marital status: Married    Spouse name: Not on file   Number of children: 3   Years of education: Not on file   Highest education level: Not on file  Occupational History    Employer: RETIRED  Tobacco Use   Smoking status: Never    Smokeless tobacco: Never  Vaping Use   Vaping Use: Never used  Substance and Sexual Activity   Alcohol use: Yes    Alcohol/week: 0.0 standard drinks    Comment: 02/25/2016 "glass of wine/month"   Drug use: No   Sexual activity: Yes    Birth control/protection: Post-menopausal    Comment: 1st intercourse 28 yo-1 partner  Other Topics Concern   Not on file  Social History Narrative   Lives in [place].  Retired Solicitor.   Social Determinants of Health   Financial Resource Strain: Not on file  Food Insecurity: Not on file  Transportation Needs: Not on file  Physical Activity: Not on file  Stress: Not on file  Social Connections: Not on file     Family History: The patient's family history includes Diabetes in her brother; Heart attack in her father; Heart disease in her brother; Hypertension in her father; Lung cancer in her brother; Parkinsonism in her father; Stroke in her mother.  ROS:   Please see the  history of present illness.    All other systems reviewed and are negative.  EKGs/Labs/Other Studies Reviewed:    The following studies were reviewed today:  EKG:  EKG is ordered today.  The ekg ordered today demonstrates atrial fibrillation, occasional ventricular paced complexes, rate 91  Recent Labs: 06/30/2020: ALT 11; BUN 18; Creatinine, Ser 0.79; Hemoglobin 8.6; Platelets 183; Potassium 4.2; Sodium 138  Recent Lipid Panel    Component Value Date/Time   TRIG 95 02/14/2019 0326    Physical Exam:    VS:  BP 104/60   Pulse 97   Ht 5\' 4"  (1.626 m)   Wt 133 lb 9.6 oz (60.6 kg)   SpO2 99%   BMI 22.93 kg/m     Wt Readings from Last 3 Encounters:  08/17/20 133 lb 9.6 oz (60.6 kg)  06/30/20 130 lb (59 kg)  12/22/19 125 lb 12.8 oz (57.1 kg)     GEN:  in no acute distress HEENT: Normal NECK: No JVD CARDIAC: Irregular, normal rate, no murmurs RESPIRATORY:  Clear to auscultation without rales, wheezing or rhonchi  ABDOMEN: Soft,  non-tender MUSCULOSKELETAL:  No edema SKIN: Warm and dry NEUROLOGIC:  Alert and oriented x 3 PSYCHIATRIC:  Normal affect   ASSESSMENT:    No diagnosis found.  PLAN:    Dyspnea: Reports dyspnea with minimal exertion.  Appears euvolemic on exam -Echocardiogram -Chest x-ray -Check CBC, CMP, TSH  Atrial fibrillation: Had episodes of RVR after her small bowel resection in December.  Follows with EP, currently pursuing rate control strategy.  Echo 02/08/2019 showed EF 50-55% -Continue metoprolol 100 mg twice daily -Continue Xarelto  Hypertension: Appears controlled. Continue metoprolol   SSS: s/p PPM: Follows with EP   RTC in 3 months     Medication Adjustments/Labs and Tests Ordered: Current medicines are reviewed at length with the patient today.  Concerns regarding medicines are outlined above.  No orders of the defined types were placed in this encounter.  No orders of the defined types were placed in this encounter.   There are no Patient Instructions on file for this visit.   Signed, Megan Heinz, MD  08/17/2020 8:44 AM    [place]

## 2020-08-15 ENCOUNTER — Other Ambulatory Visit: Payer: Self-pay

## 2020-08-15 ENCOUNTER — Ambulatory Visit: Payer: Medicare Other | Attending: Internal Medicine | Admitting: Physical Therapy

## 2020-08-15 ENCOUNTER — Encounter: Payer: Self-pay | Admitting: Physical Therapy

## 2020-08-15 DIAGNOSIS — R296 Repeated falls: Secondary | ICD-10-CM | POA: Insufficient documentation

## 2020-08-15 DIAGNOSIS — M6281 Muscle weakness (generalized): Secondary | ICD-10-CM | POA: Diagnosis present

## 2020-08-15 DIAGNOSIS — R2689 Other abnormalities of gait and mobility: Secondary | ICD-10-CM | POA: Diagnosis present

## 2020-08-15 NOTE — Therapy (Signed)
[place], [place], [address] Phone: [phone]   Fax:  [phone]  Physical Therapy Treatment  Patient Details  Name: Megan Hester MRN: [id number] Date of Birth: Apr 12, 1930 Referring Provider (PT): [name], MD   Encounter Date: 08/15/2020   PT End of Session - 08/15/20 1101     Visit Number 2    Number of Visits 8    Date for PT Re-Evaluation 09/25/20    Authorization Type [employer] MCR    Authorization Time Period FOTO by 6th, KX by 15th    Progress Note Due on Visit 10    PT Start Time 1054    PT Stop Time 1130    PT Time Calculation (min) 36 min    Equipment Utilized During Treatment Gait belt    Activity Tolerance Patient tolerated treatment well    Behavior During Therapy WFL for tasks assessed/performed             Past Medical History:  Diagnosis Date   Arthritis    "all over my body" (02/25/2016)   Cancer of right breast ([place]) 1999   DCIS   GERD (gastroesophageal reflux disease)    hx; "when I was working"   Hypertension    Macular degeneration    Paroxysmal atrial fibrillation ([place])    Presence of permanent cardiac pacemaker    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    Past Surgical History:  Procedure Laterality Date   APPLICATION OF WOUND VAC Bilateral 02/01/2019   Procedure: APPLICATION OF WOUND VAC;  Surgeon: [name], MD;  Location: [facility];  Service: General;  Laterality: Bilateral;   BREAST BIOPSY Right    BREAST LUMPECTOMY Right 1999   RADIATION   CARDIAC CATHETERIZATION  02/05/1999   HYPERDYNAMIC LEFT VENTRICLE WITH EF OF 75-80%   CARDIOVERSION N/A 01/22/2015   Procedure: CARDIOVERSION;  Surgeon: [name], MD;  Location: [facility];  Service: Cardiovascular;  Laterality: N/A;   CARDIOVERSION N/A 02/22/2015   Procedure: CARDIOVERSION;  Surgeon: [name], MD;  Location: [facility];  Service: Cardiovascular;   Laterality: N/A;   DILATION AND CURETTAGE OF UTERUS     ENUCLEATION Right 2012   EP IMPLANTABLE DEVICE N/A 02/25/2016   Procedure: Pacemaker Implant;  Surgeon: [name], MD;  Location: [facility];  Service: Cardiovascular;  Laterality: N/A;   INSERT / REPLACE / REMOVE PACEMAKER     LAPAROSCOPIC SMALL BOWEL RESECTION N/A 02/01/2019   Procedure: LAPAROSCOPIC SMALL BOWEL RESECTION WITH TAP BLOCK;  Surgeon: [name], MD;  Location: [facility];  Service: General;  Laterality: N/A;   LAPAROSCOPY N/A 02/01/2019   Procedure: LAPAROSCOPY DIAGNOSTIC;  Surgeon: [name], MD;  Location: [facility];  Service: General;  Laterality: N/A;   LYSIS OF ADHESION N/A 02/01/2019   Procedure: LYSIS OF ADHESION;  Surgeon: [name], MD;  Location: [facility];  Service: General;  Laterality: N/A;   TONSILLECTOMY      There were no vitals filed for this visit.   Subjective Assessment - 08/15/20 1100     Subjective Patient reports she has not been consistent with exercises. She did do some walking and a couple days of activity. No falls since last visit.    Patient Stated Goals Improve balance and strength to reduce fall risk    Currently in Pain? No/denies                [facility]  PT Assessment - 08/15/20 0001       Observation/Other Assessments   Focus on Therapeutic Outcomes (FOTO)  48% functional status                           [facility] Adult PT Treatment/Exercise - 08/15/20 0001       Neuro Re-ed    Neuro Re-ed Details  1/2 tandem stance 3 x 30 sec      Exercises   Exercises Knee/Hip      Knee/Hip Exercises: Aerobic   Nustep L5 x 5 min with LE/UE while taking subjective      Knee/Hip Exercises: Standing   Heel Raises 2 sets;10 reps    Heel Raises Limitations 3#    Knee Flexion 2 sets;10 reps    Knee Flexion Limitations 3#    Hip Flexion 2 sets;20 reps    Hip Flexion Limitations 3#    Hip Abduction 2 sets;10 reps    Abduction Limitations 3#    Hip Extension  2 sets;10 reps    Extension Limitations 3#      Knee/Hip Exercises: Seated   Long Arc Quad 2 sets;10 reps    Long Arc Quad Weight 3 lbs.    Sit to Sand 2 sets;10 reps      Knee/Hip Exercises: Supine   Bridges 2 sets;10 reps    Straight Leg Raises 2 sets;15 reps                    PT Education - 08/15/20 1101     Education Details HEP update    Person(s) Educated Patient;Child(ren)   daughter   Methods Explanation;Demonstration;Verbal cues;Tactile cues;Handout    Comprehension Verbalized understanding;Returned demonstration;Verbal cues required;Tactile cues required;Need further instruction              PT Short Term Goals - 08/15/20 1153       PT SHORT TERM GOAL #1   Title Patient will be I with initial HEP to progress with PT    Time 4    Period Weeks    Status On-going    Target Date 08/28/20      PT SHORT TERM GOAL #2   Title PT will assess FOTO by 2nd visit and review with patient by 3rd visit to understand extected progression    Time 4    Period Weeks    Status On-going    Target Date 08/28/20               PT Long Term Goals - 08/15/20 1153       PT LONG TERM GOAL #1   Title Patient will be I with final HEP to maintain progress from PT    Time 8    Period Weeks    Status New    Target Date 09/25/20      PT LONG TERM GOAL #2   Title Patient will demonstrate bilateral knee strength = 5/5 MMT and hip strength >/= 4/5 MMT to improve steadiness with gait    Time 8    Period Weeks    Status New    Target Date 09/25/20      PT LONG TERM GOAL #3   Title Patient will be able to perform 5xSTS in </= 14 sec to indicate improve strength reduce fall risk    Time 8    Period Weeks    Status New    Target Date 09/25/20  PT LONG TERM GOAL #4   Title Patient will exhibit TUG </= 13.5 sec to indicate reduce risk of falls    Time 8    Period Weeks    Status New    Target Date 09/25/20      PT LONG TERM GOAL #5   Title Patient will  report >/= 51% functional status on FOTO    Time 8    Period Weeks    Status New    Target Date 09/25/20                   Plan - 08/15/20 1128     Clinical Impression Statement Patient tolerated therapy well with no adverse effects. Patient and daughter reported that she feels unsafe while using bands at home so HEP was adjusted this visit so exercises do not incorporate bands, but patient was encouraged to get ankle weights to use at home because she did well with them in therapy this visit. Therapy focused on continued strengthening and balance training. Patient does continue to demonstrate increased sway with balance, but seemed to be improved with sit<>stand exercise. Patient would benefit from continued skilled PT to progress her strength, mobility, and balance in order to reduce fall risk and maximize her functional ability.    PT Treatment/Interventions ADLs/Self Care Home Management;Aquatic Therapy;Cryotherapy;Electrical Stimulation;Iontophoresis 4mg /ml Dexamethasone;Moist Heat;Traction;Ultrasound;Neuromuscular re-education;Balance training;Therapeutic exercise;Therapeutic activities;Functional mobility training;Stair training;Gait training;Patient/family education;Manual techniques;Dry needling;Passive range of motion;Taping;Vasopneumatic Device;Spinal Manipulations;Joint Manipulations    PT Next Visit Plan Review HEP and progress PRN, perform BERG PRN, progress general LE strengthening, balance training moving outside base of support, practice getting up from floor    PT Home Exercise Plan [id number]    Consulted and Agree with Plan of Care Patient;Family member/caregiver    Family Member Consulted Daughter             Patient will benefit from skilled therapeutic intervention in order to improve the following deficits and impairments:  Abnormal gait, Decreased range of motion, Difficulty walking, Decreased activity tolerance, Decreased balance, Postural dysfunction,  Decreased strength  Visit Diagnosis: Other abnormalities of gait and mobility  Muscle weakness (generalized)  Repeated falls     Problem List Patient Active Problem List   Diagnosis Date Noted   SIRS (systemic inflammatory response syndrome) (HCC)    Acute blood loss anemia    Malnutrition of moderate degree 02/12/2019   Closed loop SBO (small bowel obstruction)  02/01/2019   Sepsis ([place]) 02/01/2019   Chronic anticoagulation 02/01/2019   Cardiac pacemaker in place - [employer] [id number] [address] DR MRI 02/01/2019   Rectocele 02/01/2019   DM (diabetes mellitus) ([place]) 02/01/2019   Bowel obstruction ([place]) 02/01/2019   Acute focal ischemia of small intestine ([place]) 02/01/2019   Pseudophakia of left eye 04/03/2016   Sick sinus syndrome ([place]) 02/25/2016   Cancer ([place])    Shortness of breath 11/17/2011   Advanced nonexudative age-related macular degeneration of left eye without subfoveal involvement 04/10/2011   Nuclear cataract 04/10/2011   Age-related macular degeneration 12/03/2010   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    

## 2020-08-17 ENCOUNTER — Other Ambulatory Visit: Payer: Self-pay

## 2020-08-17 ENCOUNTER — Encounter (HOSPITAL_BASED_OUTPATIENT_CLINIC_OR_DEPARTMENT_OTHER): Payer: Self-pay | Admitting: Cardiology

## 2020-08-17 ENCOUNTER — Ambulatory Visit (HOSPITAL_BASED_OUTPATIENT_CLINIC_OR_DEPARTMENT_OTHER): Payer: Medicare Other | Admitting: Cardiology

## 2020-08-17 VITALS — BP 104/60 | HR 97 | Ht 64.0 in | Wt 133.6 lb

## 2020-08-17 DIAGNOSIS — R0602 Shortness of breath: Secondary | ICD-10-CM

## 2020-08-17 DIAGNOSIS — I10 Essential (primary) hypertension: Secondary | ICD-10-CM

## 2020-08-17 DIAGNOSIS — I4819 Other persistent atrial fibrillation: Secondary | ICD-10-CM

## 2020-08-17 LAB — COMPREHENSIVE METABOLIC PANEL
ALT: 7 IU/L (ref 0–32)
AST: 13 IU/L (ref 0–40)
Albumin/Globulin Ratio: 2.4 — ABNORMAL HIGH (ref 1.2–2.2)
Albumin: 4.4 g/dL (ref 3.6–4.6)
Alkaline Phosphatase: 121 IU/L (ref 44–121)
BUN/Creatinine Ratio: 21 (ref 12–28)
BUN: 21 mg/dL (ref 8–27)
Bilirubin Total: 0.6 mg/dL (ref 0.0–1.2)
CO2: 21 mmol/L (ref 20–29)
Calcium: 9.3 mg/dL (ref 8.7–10.3)
Chloride: 106 mmol/L (ref 96–106)
Creatinine, Ser: 1.01 mg/dL — ABNORMAL HIGH (ref 0.57–1.00)
Globulin, Total: 1.8 g/dL (ref 1.5–4.5)
Glucose: 90 mg/dL (ref 65–99)
Potassium: 5.1 mmol/L (ref 3.5–5.2)
Sodium: 140 mmol/L (ref 134–144)
Total Protein: 6.2 g/dL (ref 6.0–8.5)
eGFR: 54 mL/min/{1.73_m2} — ABNORMAL LOW (ref >59)

## 2020-08-17 LAB — CBC
Hematocrit: 24.4 % — ABNORMAL LOW (ref 34.0–46.6)
Hemoglobin: 8.6 g/dL — ABNORMAL LOW (ref 11.1–15.9)
MCH: 37.2 pg — ABNORMAL HIGH (ref 26.6–33.0)
MCHC: 35.2 g/dL (ref 31.5–35.7)
MCV: 106 fL — ABNORMAL HIGH (ref 79–97)
NRBC: 1 % — ABNORMAL HIGH (ref 0–0)
Platelets: 237 10*3/uL (ref 150–450)
RBC: 2.31 x10E6/uL — CL (ref 3.77–5.28)
RDW: 14.9 % (ref 11.7–15.4)
WBC: 5.6 10*3/uL (ref 3.4–10.8)

## 2020-08-17 LAB — TSH: TSH: 1.84 u[IU]/mL (ref 0.450–4.500)

## 2020-08-17 NOTE — Patient Instructions (Signed)
Medication Instructions:  Your physician recommends that you continue on your current medications as directed. Please refer to the Current Medication list given to you today.  *If you need a refill on your cardiac medications before your next appointment, please call your pharmacy*   Lab Work: CMET, CBC, Lipid at the [facility] or any [facility]  [facility] lab hours are Monday-Friday 8:00-4:00, closed for lunch 12:45-1:45 pm.  No appointment needed.  Testing/Procedures: Your physician has requested that you have an echocardiogram. Echocardiography is a painless test that uses sound waves to create images of your heart. It provides your doctor with information about the size and shape of your heart and how well your heart's chambers and valves are working. This procedure takes approximately one hour. There are no restrictions for this procedure.  A chest x-ray takes a picture of the organs and structures inside the chest, including the heart, lungs, and blood vessels. This test can show several things, including, whether the heart is enlarges; whether fluid is building up in the lungs; and whether pacemaker / defibrillator leads are still in place. --this will be completed at [facility]: [address]-no appointment needed 8-4 M-F  Follow-Up: At [facility], you and your health needs are our priority.  As part of our continuing mission to provide you with exceptional heart care, we have created designated Provider Care Teams.  These Care Teams include your primary Cardiologist (physician) and Advanced Practice Providers (APPs -  Physician Assistants and Nurse Practitioners) who all work together to provide you with the care you need, when you need it.  We recommend signing up for the patient portal called "MyChart".  Sign up information is provided on this After Visit Summary.  MyChart is used to connect with patients for Virtual Visits (Telemedicine).  Patients are able to  view lab/test results, encounter notes, upcoming appointments, etc.  Non-urgent messages can be sent to your provider as well.   To learn more about what you can do with MyChart, go to [email or web address].    Your next appointment:   3 month(s)  The format for your next appointment:   In Person  Provider:   Oswaldo Milian, MD

## 2020-08-17 NOTE — Progress Notes (Signed)
Cardiology Office Note:    Date:  08/18/2020   ID:  [name], DOB 28-Jan-1931, MRN [id number]  PCP:  [name], MD  Cardiologist:  Donato Heinz, MD  Electrophysiologist:  [name], MD   Referring MD: [name], MD   Chief complaint: Atrial fibrillation  History of Present Illness:    Megan Hester is a 87 y.o. female with a hx of SBO with small bowel resection 02/01/2019, paroxysmal atrial fibrillation, sick sinus syndrome status post PPM, hypertension, type 2 diabetes who presents for follow-up.  She was admitted to [facility] with SBO and underwent small bowel resection 02/01/2019.  Her postoperative course was complicated by AF with RVR.  Given her hypotension, she was initially started on amiodarone drip.  She was transitioned off amiodarone due to elevated LFTs and started on metoprolol and digoxin for rate control.  Plan on discharge on 02/16/2019 was for 2 weeks of digoxin and then restart flecainide.  She reported that when she restarted flecainide 2 weeks post discharge, she developed extensive lower extremity edema and weakness/dizziness.  Her symptoms resolved with stopping flecainide.  Most recent device check 09/07/2019 showed AF burden 12%, rates controlled.  TTE 02/08/2019 showed LVEF 50 to 55%, normal RV function, indeterminate diastolic function, no significant valvular disease.   Since last clinic visit, she present to the ED on 06/30/2020 after fall.  She did not hit her head.  Head CT was unremarkable.  Today, pt is accompanied with her daughter and helped provide history. Pt is currently in afib. Pt's daughter she is currently taking physical therapy, which is going well and pt is gaining strength, however she has been having dyspnea with minimal exertion.  Pt begins to feel dizzy while changing positions.Pt is currently taking Xarelto. Of note, she has occasional epistaxis.Patient denies chest pain, abdominal pain, diarrhea, palpations,  nausea, headaches, syncope,orthopnea or PND.     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    Past Surgical History:  Procedure Laterality Date   APPLICATION OF WOUND VAC Bilateral 02/01/2019   Procedure: APPLICATION OF WOUND VAC;  Surgeon: [name], MD;  [facility];  Service: General;  Laterality: Bilateral;   BREAST BIOPSY Right    BREAST LUMPECTOMY Right 1999   RADIATION   CARDIAC CATHETERIZATION  02/05/1999   HYPERDYNAMIC LEFT VENTRICLE WITH EF OF 75-80%   CARDIOVERSION N/A 01/22/2015   Procedure: CARDIOVERSION;  Surgeon: [name], MD;  Location: [facility];  Service: Cardiovascular;  Laterality: N/A;   CARDIOVERSION N/A 02/22/2015   Procedure: CARDIOVERSION;  Surgeon: [name], MD;  Location: [facility];  Service: Cardiovascular;  Laterality: N/A;   DILATION AND CURETTAGE OF UTERUS     ENUCLEATION Right 2012   EP IMPLANTABLE DEVICE N/A 02/25/2016   Procedure: Pacemaker Implant;  Surgeon: [name], MD;  Location: [place] [facility];  Service:  Cardiovascular;  Laterality: N/A;   INSERT / REPLACE / REMOVE PACEMAKER     LAPAROSCOPIC SMALL BOWEL RESECTION N/A 02/01/2019   Procedure: LAPAROSCOPIC SMALL BOWEL RESECTION WITH TAP BLOCK;  Surgeon: [name], MD;  [facility];  Service: General;  Laterality: N/A;   LAPAROSCOPY N/A 02/01/2019   Procedure: LAPAROSCOPY DIAGNOSTIC;  Surgeon: [name], MD;  [facility];  Service: General;  Laterality: N/A;   LYSIS  OF ADHESION N/A 02/01/2019   Procedure: LYSIS OF ADHESION;  Surgeon: [name], MD;  [facility];  Service: General;  Laterality: N/A;   TONSILLECTOMY      Current Medications: Current Meds  Medication Sig   cetirizine (ZYRTEC) 10 MG tablet Take 10 mg by mouth at bedtime.   Cholecalciferol (VITAMIN D) 1000 UNITS capsule Take 1,000 Units by mouth at bedtime.    clobetasol cream (TEMOVATE) 1.61 % Apply 1 application topically daily as needed (dermatitis).    Glucosamine-Chondroitin (GLUCOSAMINE CHONDR COMPLEX PO) Take 1 capsule by mouth daily.   Hypromellose (ARTIFICIAL TEARS OP) Place 1 drop into the left eye 2 (two) times daily.    metoprolol tartrate (LOPRESSOR) 100 MG tablet TAKE 1 TABLET BY MOUTH TWICE DAILY.   Multiple Vitamins-Minerals (PRESERVISION/LUTEIN PO) Take 1 capsule by mouth 2 (two) times daily.    Polyethyl Glycol-Propyl Glycol (SYSTANE OP) Place 1 drop into the right eye 2 (two) times daily.    polyethylene glycol (MIRALAX / GLYCOLAX) packet Take 17 g by mouth daily as needed for moderate constipation.    QUEtiapine (SEROQUEL) 25 MG tablet Take 25 mg by mouth at bedtime.   sertraline (ZOLOFT) 100 MG tablet Take 100 mg by mouth at bedtime.    XARELTO 15 MG TABS tablet TAKE 1 TABLET ONCE DAILY WITH SUPPER.   [DISCONTINUED] Multiple Vitamins-Minerals (CENTRUM SILVER PO) Take 1 capsule by mouth daily.    [DISCONTINUED] neomycin-polymyxin-dexameth (MAXITROL) 0.1 % OINT Place 1 application into the right eye at bedtime.     Allergies:   Shellfish allergy, Atropine, Codeine, Doxycycline, Fenoprofen calcium, Iodinated diagnostic agents, Isopto hyoscine [scopolamine], Levaquin [levofloxacin], Other, Oxycodone, Penicillins, Sulfa drugs cross reactors, Warfarin, Ceclor [cefaclor], and Tape   Social History   Socioeconomic History   Marital status: Married    Spouse name: Not on file   Number of children: 3   Years of education: Not on file   Highest education level: Not  on file  Occupational History    Employer: RETIRED  Tobacco Use   Smoking status: Never   Smokeless tobacco: Never  Vaping Use   Vaping Use: Never used  Substance and Sexual Activity   Alcohol use: Yes    Alcohol/week: 0.0 standard drinks    Comment: 02/25/2016 "glass of wine/month"   Drug use: No   Sexual activity: Yes    Birth control/protection: Post-menopausal    Comment: 1st intercourse 8 yo-1 partner  Other Topics Concern   Not on file  Social History Narrative   Lives in [place].  Retired Solicitor.   Social Determinants of Health   Financial Resource Strain: Not on file  Food Insecurity: Not on file  Transportation Needs: Not on file  Physical Activity: Not on file  Stress: Not on file  Social Connections: Not on file     Family History: The patient's family history includes Diabetes in her brother; Heart attack in her father; Heart disease in her brother; Hypertension in her father; Lung cancer in her brother; Parkinsonism in her father; Stroke  in her mother.  ROS:   Please see the history of present illness.    (+) LE Edema (+) SOB (+) epistaxis (+)lightheadedness All other systems reviewed and are negative.  EKGs/Labs/Other Studies Reviewed:    The following studies were reviewed today:  02/08/2019 Echo: 1. Left ventricular ejection fraction, by visual estimation, is 50 to  55%. The left ventricle has low normal function. Left ventricular septal  wall thickness was normal. Normal left ventricular posterior wall  thickness. There is no left ventricular  hypertrophy.   2. Left ventricular diastolic parameters are indeterminate.   3. The left ventricle has no regional wall motion abnormalities.   4. Global right ventricle has normal systolic function.The right  ventricular size is normal. No increase in right ventricular wall  thickness.   5. Left atrial size was normal.   6. Right atrial size was normal.   7. The mitral valve is normal in  structure. Trivial mitral valve  regurgitation. No evidence of mitral stenosis.   8. The tricuspid valve is normal in structure.   9. The aortic valve is tricuspid. Aortic valve regurgitation is not  visualized. No evidence of aortic valve sclerosis or stenosis.  10. The pulmonic valve was normal in structure. Pulmonic valve  regurgitation is not visualized.  11. Mildly elevated pulmonary artery systolic pressure.  12. A pacer wire is visualized.  13. The inferior vena cava is normal in size with greater than 50%  respiratory variability, suggesting right atrial pressure of 3 mmHg.   EKG:  08/17/2020: atrial fib, rate 97 incomplete RBBB 06/21/2019: atrial fibrillation, occasional ventricular paced complexes, rate 91  Recent Labs: 08/17/2020: ALT 7; BUN 21; Creatinine, Ser 1.01; Hemoglobin 8.6; Platelets 237; Potassium 5.1; Sodium 140; TSH 1.840  Recent Lipid Panel    Component Value Date/Time   TRIG 95 02/14/2019 0326    Physical Exam:    VS:  BP 104/60   Pulse 97   Ht 5\' 4"  (1.626 m)   Wt 133 lb 9.6 oz (60.6 kg)   SpO2 99%   BMI 22.93 kg/m     Wt Readings from Last 3 Encounters:  08/17/20 133 lb 9.6 oz (60.6 kg)  06/30/20 130 lb (59 kg)  12/22/19 125 lb 12.8 oz (57.1 kg)     GEN:  in no acute distress HEENT: Normal NECK: No JVD CARDIAC: Irregular, normal rate, no murmurs RESPIRATORY:  Clear to auscultation without rales, wheezing or rhonchi  ABDOMEN: Soft, non-tender MUSCULOSKELETAL:  No edema SKIN: Warm and dry NEUROLOGIC:  Alert and oriented x 3 PSYCHIATRIC:  Normal affect   ASSESSMENT:    1. Shortness of breath   2. Persistent atrial fibrillation ([place])   3. Essential hypertension     PLAN:    Dyspnea: Reports dyspnea with minimal exertion.  Appears euvolemic on exam -Echocardiogram -Chest x-ray -Check CBC, CMP, TSH  Atrial fibrillation: Had episodes of RVR after her small bowel resection in December.  Follows with EP, currently pursuing rate control  strategy.  Echo 02/08/2019 showed EF 50-55% -Continue metoprolol 100 mg twice daily -Continue Xarelto  Hypertension: Appears controlled. Continue metoprolol   SSS: s/p PPM: Follows with EP   RTC in 3 months       Medication Adjustments/Labs and Tests Ordered: Current medicines are reviewed at length with the patient today.  Concerns regarding medicines are outlined above.  Orders Placed This Encounter  Procedures   DG Chest 2 View   Comprehensive metabolic panel   CBC  TSH   EKG 12-Lead   ECHOCARDIOGRAM COMPLETE    No orders of the defined types were placed in this encounter.   Patient Instructions  Medication Instructions:  Your physician recommends that you continue on your current medications as directed. Please refer to the Current Medication list given to you today.  *If you need a refill on your cardiac medications before your next appointment, please call your pharmacy*   Lab Work: CMET, CBC, Lipid at the [facility] or any [facility]  [facility] lab hours are Monday-Friday 8:00-4:00, closed for lunch 12:45-1:45 pm.  No appointment needed.  Testing/Procedures: Your physician has requested that you have an echocardiogram. Echocardiography is a painless test that uses sound waves to create images of your heart. It provides your doctor with information about the size and shape of your heart and how well your heart's chambers and valves are working. This procedure takes approximately one hour. There are no restrictions for this procedure.  A chest x-ray takes a picture of the organs and structures inside the chest, including the heart, lungs, and blood vessels. This test can show several things, including, whether the heart is enlarges; whether fluid is building up in the lungs; and whether pacemaker / defibrillator leads are still in place. --this will be completed at [facility]: [address]-no appointment needed 8-4 M-F  Follow-Up: At [facility], you and your health needs are our priority.  As part of our continuing mission to provide you with exceptional heart care, we have created designated Provider Care Teams.  These Care Teams include your primary Cardiologist (physician) and Advanced Practice Providers (APPs -  Physician Assistants and Nurse Practitioners) who all work together to provide you with the care you need, when you need it.  We recommend signing up for the patient portal called "MyChart".  Sign up information is provided on this After Visit Summary.  MyChart is used to connect with patients for Virtual Visits (Telemedicine).  Patients are able to view lab/test results, encounter notes, upcoming appointments, etc.  Non-urgent messages can be sent to your provider as well.   To learn more about what you can do with MyChart, go to [email or web address].    Your next appointment:   3 month(s)  The format for your next appointment:   In Person  Provider:   Oswaldo Milian, MD       Ardell Isaacs as a scribe for Donato Heinz, MD.,have documented all relevant documentation on the behalf of Donato Heinz, MD,as directed by  Donato Heinz, MD while in the presence of Donato Heinz, MD.  I, Donato Heinz, MD, have reviewed all documentation for this visit. The documentation on 08/18/20 for the exam, diagnosis, procedures, and orders are all accurate and complete.   Signed, Donato Heinz, MD  08/18/2020 1:31 PM    [facility]

## 2020-08-21 ENCOUNTER — Other Ambulatory Visit: Payer: Self-pay | Admitting: *Deleted

## 2020-08-21 DIAGNOSIS — R0602 Shortness of breath: Secondary | ICD-10-CM

## 2020-08-21 DIAGNOSIS — D649 Anemia, unspecified: Secondary | ICD-10-CM

## 2020-08-22 ENCOUNTER — Telehealth: Payer: Self-pay | Admitting: Internal Medicine

## 2020-08-22 NOTE — Telephone Encounter (Signed)
Received a new hem referral from Dr. Gilman Schmidt for anemia. Megan Hester' son cld to schedule an appt w/Dr. Julien Nordmann on 7/19 at 11:45am w/labs at 11:15am. Aware to arrive 15 minutes early.

## 2020-08-24 ENCOUNTER — Ambulatory Visit: Payer: Medicare Other | Admitting: Physical Therapy

## 2020-08-24 ENCOUNTER — Other Ambulatory Visit: Payer: Self-pay

## 2020-08-24 ENCOUNTER — Encounter: Payer: Self-pay | Admitting: Physical Therapy

## 2020-08-24 DIAGNOSIS — R2689 Other abnormalities of gait and mobility: Secondary | ICD-10-CM | POA: Diagnosis not present

## 2020-08-24 DIAGNOSIS — R296 Repeated falls: Secondary | ICD-10-CM

## 2020-08-24 DIAGNOSIS — M6281 Muscle weakness (generalized): Secondary | ICD-10-CM

## 2020-08-24 NOTE — Therapy (Signed)
Megan Hester, [place], [address] Phone: [phone]   Fax:  [phone]  Physical Therapy Treatment  Patient Details  Name: Megan Hester MRN: [id number] Date of Birth: 01-21-1931 Referring Provider (PT): [name], MD   Encounter Date: 08/24/2020   PT End of Session - 08/24/20 1146     Visit Number 3    Number of Visits 8    Date for PT Re-Evaluation 09/25/20    Authorization Type UHC MCR    Authorization Time Period FOTO by 6th, KX by 15th    Progress Note Due on Visit 10    PT Start Time 1133    PT Stop Time 1213    PT Time Calculation (min) 40 min    Equipment Utilized During Treatment Gait belt    Activity Tolerance Patient tolerated treatment well    Behavior During Therapy [facility] for tasks assessed/performed             Past Medical History:  Diagnosis Date   Arthritis    "all over my body" (02/25/2016)   Cancer of right breast ([place]) 1999   DCIS   GERD (gastroesophageal reflux disease)    hx; "when I was working"   Hypertension    Macular degeneration    Paroxysmal atrial fibrillation ([place])    Presence of permanent cardiac pacemaker    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    Past Surgical History:  Procedure Laterality Date   APPLICATION OF WOUND VAC Bilateral 02/01/2019   Procedure: APPLICATION OF WOUND VAC;  Surgeon: [name], MD;  [facility];  Service: General;  Laterality: Bilateral;   BREAST BIOPSY Right    BREAST LUMPECTOMY Right 1999   RADIATION   CARDIAC CATHETERIZATION  02/05/1999   HYPERDYNAMIC LEFT VENTRICLE WITH EF OF 75-80%   CARDIOVERSION N/A 01/22/2015   Procedure: CARDIOVERSION;  Surgeon: [name], MD;  Location: [facility];  Service: Cardiovascular;  Laterality: N/A;   CARDIOVERSION N/A 02/22/2015   Procedure: CARDIOVERSION;  Surgeon: [name], MD;  Location: [facility];  Service: Cardiovascular;   Laterality: N/A;   DILATION AND CURETTAGE OF UTERUS     ENUCLEATION Right 2012   EP IMPLANTABLE DEVICE N/A 02/25/2016   Procedure: Pacemaker Implant;  Surgeon: [name], MD;  Location: [facility];  Service: Cardiovascular;  Laterality: N/A;   INSERT / REPLACE / REMOVE PACEMAKER     LAPAROSCOPIC SMALL BOWEL RESECTION N/A 02/01/2019   Procedure: LAPAROSCOPIC SMALL BOWEL RESECTION WITH TAP BLOCK;  Surgeon: [name], MD;  [facility];  Service: General;  Laterality: N/A;   LAPAROSCOPY N/A 02/01/2019   Procedure: LAPAROSCOPY DIAGNOSTIC;  Surgeon: [name], MD;  [facility];  Service: General;  Laterality: N/A;   LYSIS OF ADHESION N/A 02/01/2019   Procedure: LYSIS OF ADHESION;  Surgeon: [name], MD;  [facility];  Service: General;  Laterality: N/A;   TONSILLECTOMY      There were no vitals filed for this visit.   Subjective Assessment - 08/24/20 1138     Subjective Patient and daughter report that she saw the doctor after last visit and found out her iron was low so she did not do a lot of her exercises. She is currently feeling better now.    Patient is accompained by: Family member   daughter   Patient Stated Goals Improve balance and strength to reduce fall risk  Currently in Pain? No/denies                [facility] PT Assessment - 08/24/20 0001       Standardized Balance Assessment   Five times sit to stand comments  17                           [facility] Adult PT Treatment/Exercise - 08/24/20 0001       Neuro Re-ed    Neuro Re-ed Details  3/4 tandem stance 3 x 30 sec, alternating toe taps on 8" step 2 x 20      Exercises   Exercises Knee/Hip      Knee/Hip Exercises: Aerobic   Nustep L5 x 5 min with LE/UE while taking subjective      Knee/Hip Exercises: Standing   Heel Raises 2 sets;10 reps    Heel Raises Limitations 3#    Hip Flexion 2 sets;20 reps    Hip Flexion Limitations 3#    Hip Abduction 2 sets;10 reps     Abduction Limitations 3#    Hip Extension 2 sets;10 reps    Extension Limitations 3#      Knee/Hip Exercises: Seated   Long Arc Quad 2 sets;10 reps    Long Arc Quad Weight 3 lbs.    Sit to Sand 2 sets;10 reps      Knee/Hip Exercises: Supine   Bridges 2 sets;10 reps    Straight Leg Raises 2 sets;10 reps                    PT Education - 08/24/20 1139     Education Details HEP    Person(s) Educated Patient;Child(ren)   daughter   Methods Explanation;Demonstration;Verbal cues    Comprehension Verbalized understanding;Returned demonstration;Verbal cues required;Need further instruction              PT Short Term Goals - 08/15/20 1153       PT SHORT TERM GOAL #1   Title Patient will be I with initial HEP to progress with PT    Time 4    Period Weeks    Status On-going    Target Date 08/28/20      PT SHORT TERM GOAL #2   Title PT will assess FOTO by 2nd visit and review with patient by 3rd visit to understand extected progression    Time 4    Period Weeks    Status On-going    Target Date 08/28/20               PT Long Term Goals - 08/15/20 1153       PT LONG TERM GOAL #1   Title Patient will be I with final HEP to maintain progress from PT    Time 8    Period Weeks    Status New    Target Date 09/25/20      PT LONG TERM GOAL #2   Title Patient will demonstrate bilateral knee strength = 5/5 MMT and hip strength >/= 4/5 MMT to improve steadiness with gait    Time 8    Period Weeks    Status New    Target Date 09/25/20      PT LONG TERM GOAL #3   Title Patient will be able to perform 5xSTS in </= 14 sec to indicate improve strength reduce fall risk    Time 8    Period Weeks  Status New    Target Date 09/25/20      PT LONG TERM GOAL #4   Title Patient will exhibit TUG </= 13.5 sec to indicate reduce risk of falls    Time 8    Period Weeks    Status New    Target Date 09/25/20      PT LONG TERM GOAL #5   Title Patient will  report >/= 51% functional status on FOTO    Time 8    Period Weeks    Status New    Target Date 09/25/20                   Plan - 08/24/20 1147     Clinical Impression Statement Patient tolerated therapy well with no adverse effects. She exhibits improvement in her 5xSTS this visit and continues to tolerate progressions in strengthening and balance training well. She was able to progress tandem stance this visit but did require occasional touch down support with toe taps on step. No change made to HEP this visit. Patient would benefit from continued skilled PT to progress her strength, mobility, and balance in order to reduce fall risk and maximize her functional ability.    PT Treatment/Interventions ADLs/Self Care Home Management;Aquatic Therapy;Cryotherapy;Electrical Stimulation;Iontophoresis 4mg /ml Dexamethasone;Moist Heat;Traction;Ultrasound;Neuromuscular re-education;Balance training;Therapeutic exercise;Therapeutic activities;Functional mobility training;Stair training;Gait training;Patient/family education;Manual techniques;Dry needling;Passive range of motion;Taping;Vasopneumatic Device;Spinal Manipulations;Joint Manipulations    PT Next Visit Plan Address STGs, review HEP and progress PRN, perform BERG PRN, progress general LE strengthening, balance training moving outside base of support, practice getting up from floor    PT Home Exercise Plan [id number]    Consulted and Agree with Plan of Care Patient;Family member/caregiver    Family Member Consulted Daughter             Patient will benefit from skilled therapeutic intervention in order to improve the following deficits and impairments:  Abnormal gait, Decreased range of motion, Difficulty walking, Decreased activity tolerance, Decreased balance, Postural dysfunction, Decreased strength  Visit Diagnosis: Other abnormalities of gait and mobility  Muscle weakness (generalized)  Repeated falls     Problem  List Patient Active Problem List   Diagnosis Date Noted   SIRS (systemic inflammatory response syndrome) (HCC)    Acute blood loss anemia    Malnutrition of moderate degree 02/12/2019   Closed loop SBO (small bowel obstruction)  02/01/2019   Sepsis ([place]) 02/01/2019   Chronic anticoagulation 02/01/2019   Cardiac pacemaker in place - [employer]onic [id number] [address]visa DR MRI 02/01/2019   Rectocele 02/01/2019   DM (diabetes mellitus) ([place]) 02/01/2019   Bowel obstruction ([place]) 02/01/2019   Acute focal ischemia of small intestine ([place]) 02/01/2019   Pseudophakia of left eye 04/03/2016   Sick sinus syndrome ([place]) 02/25/2016   Cancer ([place])    Shortness of breath 11/17/2011   Advanced nonexudative age-related macular degeneration of left eye without subfoveal involvement 04/10/2011   Nuclear cataract 04/10/2011   Age-related macular degeneration 12/03/2010   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 [place], [place], [address] Phone: [phone]   Fax:  [phone]  Name: Estephani Popper MRN: [id number] Date of Birth: 1930-10-04

## 2020-08-27 ENCOUNTER — Other Ambulatory Visit: Payer: Self-pay | Admitting: Internal Medicine

## 2020-08-27 ENCOUNTER — Ambulatory Visit (INDEPENDENT_AMBULATORY_CARE_PROVIDER_SITE_OTHER): Payer: Medicare Other

## 2020-08-27 DIAGNOSIS — I495 Sick sinus syndrome: Secondary | ICD-10-CM | POA: Diagnosis not present

## 2020-08-27 DIAGNOSIS — D539 Nutritional anemia, unspecified: Secondary | ICD-10-CM

## 2020-08-28 ENCOUNTER — Inpatient Hospital Stay: Payer: Medicare Other | Attending: Internal Medicine | Admitting: Internal Medicine

## 2020-08-28 ENCOUNTER — Other Ambulatory Visit: Payer: Self-pay | Admitting: *Deleted

## 2020-08-28 ENCOUNTER — Other Ambulatory Visit: Payer: Self-pay | Admitting: Internal Medicine

## 2020-08-28 ENCOUNTER — Other Ambulatory Visit: Payer: Self-pay

## 2020-08-28 ENCOUNTER — Inpatient Hospital Stay: Payer: Medicare Other

## 2020-08-28 VITALS — BP 91/59 | HR 62 | Temp 97.5°F | Resp 18 | Ht 64.0 in | Wt 135.1 lb

## 2020-08-28 DIAGNOSIS — D62 Acute posthemorrhagic anemia: Secondary | ICD-10-CM

## 2020-08-28 DIAGNOSIS — Z903 Acquired absence of stomach [part of]: Secondary | ICD-10-CM

## 2020-08-28 DIAGNOSIS — R634 Abnormal weight loss: Secondary | ICD-10-CM | POA: Insufficient documentation

## 2020-08-28 DIAGNOSIS — R040 Epistaxis: Secondary | ICD-10-CM | POA: Diagnosis not present

## 2020-08-28 DIAGNOSIS — E538 Deficiency of other specified B group vitamins: Secondary | ICD-10-CM | POA: Insufficient documentation

## 2020-08-28 DIAGNOSIS — Z7901 Long term (current) use of anticoagulants: Secondary | ICD-10-CM

## 2020-08-28 DIAGNOSIS — D539 Nutritional anemia, unspecified: Secondary | ICD-10-CM

## 2020-08-28 DIAGNOSIS — Z800 Family history of malignant neoplasm of digestive organs: Secondary | ICD-10-CM

## 2020-08-28 DIAGNOSIS — Z801 Family history of malignant neoplasm of trachea, bronchus and lung: Secondary | ICD-10-CM

## 2020-08-28 LAB — CBC WITH DIFFERENTIAL (CANCER CENTER ONLY)
Abs Immature Granulocytes: 0.01 10*3/uL (ref 0.00–0.07)
Basophils Absolute: 0.1 10*3/uL (ref 0.0–0.1)
Basophils Relative: 1 %
Eosinophils Absolute: 0.2 10*3/uL (ref 0.0–0.5)
Eosinophils Relative: 4 %
HCT: 24.4 % — ABNORMAL LOW (ref 36.0–46.0)
Hemoglobin: 8.2 g/dL — ABNORMAL LOW (ref 12.0–15.0)
Immature Granulocytes: 0 %
Lymphocytes Relative: 34 %
Lymphs Abs: 1.7 10*3/uL (ref 0.7–4.0)
MCH: 37.8 pg — ABNORMAL HIGH (ref 26.0–34.0)
MCHC: 33.6 g/dL (ref 30.0–36.0)
MCV: 112.4 fL — ABNORMAL HIGH (ref 80.0–100.0)
Monocytes Absolute: 0.5 10*3/uL (ref 0.1–1.0)
Monocytes Relative: 10 %
Neutro Abs: 2.7 10*3/uL (ref 1.7–7.7)
Neutrophils Relative %: 51 %
Platelet Count: 261 10*3/uL (ref 150–400)
RBC: 2.17 MIL/uL — ABNORMAL LOW (ref 3.87–5.11)
RDW: 16.2 % — ABNORMAL HIGH (ref 11.5–15.5)
WBC Count: 5.2 10*3/uL (ref 4.0–10.5)
nRBC: 0.6 % — ABNORMAL HIGH (ref 0.0–0.2)

## 2020-08-28 LAB — CMP (CANCER CENTER ONLY)
ALT: 7 U/L (ref 0–44)
AST: 13 U/L — ABNORMAL LOW (ref 15–41)
Albumin: 3.7 g/dL (ref 3.5–5.0)
Alkaline Phosphatase: 107 U/L (ref 38–126)
Anion gap: 8 (ref 5–15)
BUN: 21 mg/dL (ref 8–23)
CO2: 24 mmol/L (ref 22–32)
Calcium: 8.9 mg/dL (ref 8.9–10.3)
Chloride: 108 mmol/L (ref 98–111)
Creatinine: 1.02 mg/dL — ABNORMAL HIGH (ref 0.44–1.00)
GFR, Estimated: 53 mL/min — ABNORMAL LOW (ref >60)
Glucose, Bld: 114 mg/dL — ABNORMAL HIGH (ref 70–99)
Potassium: 4.3 mmol/L (ref 3.5–5.1)
Sodium: 140 mmol/L (ref 135–145)
Total Bilirubin: 0.8 mg/dL (ref 0.3–1.2)
Total Protein: 6.4 g/dL — ABNORMAL LOW (ref 6.5–8.1)

## 2020-08-28 LAB — IRON AND TIBC
Iron: 241 ug/dL — ABNORMAL HIGH (ref 41–142)
Saturation Ratios: 102 % — ABNORMAL HIGH (ref 21–57)
TIBC: 236 ug/dL (ref 236–444)
UIBC: UNDETERMINED ug/dL (ref 120–384)

## 2020-08-28 LAB — CUP PACEART REMOTE DEVICE CHECK
Battery Remaining Longevity: 54 mo
Battery Voltage: 2.99 V
Brady Statistic AP VP Percent: 1.1 %
Brady Statistic AP VS Percent: 78.64 %
Brady Statistic AS VP Percent: 1.59 %
Brady Statistic AS VS Percent: 18.67 %
Brady Statistic RA Percent Paced: 67.02 %
Brady Statistic RV Percent Paced: 2.45 %
Date Time Interrogation Session: 20220718183136
Implantable Lead Implant Date: 20180115
Implantable Lead Implant Date: 20180115
Implantable Lead Location: 753859
Implantable Lead Location: 753860
Implantable Lead Model: 5076
Implantable Lead Model: 5076
Implantable Pulse Generator Implant Date: 20180115
Lead Channel Impedance Value: 323 Ohm
Lead Channel Impedance Value: 399 Ohm
Lead Channel Impedance Value: 399 Ohm
Lead Channel Impedance Value: 475 Ohm
Lead Channel Pacing Threshold Amplitude: 0.625 V
Lead Channel Pacing Threshold Amplitude: 0.75 V
Lead Channel Pacing Threshold Pulse Width: 0.4 ms
Lead Channel Pacing Threshold Pulse Width: 0.4 ms
Lead Channel Sensing Intrinsic Amplitude: 0.25 mV
Lead Channel Sensing Intrinsic Amplitude: 0.25 mV
Lead Channel Sensing Intrinsic Amplitude: 11.5 mV
Lead Channel Sensing Intrinsic Amplitude: 11.5 mV
Lead Channel Setting Pacing Amplitude: 2 V
Lead Channel Setting Pacing Amplitude: 2.5 V
Lead Channel Setting Pacing Pulse Width: 0.4 ms
Lead Channel Setting Sensing Sensitivity: 2 mV

## 2020-08-28 LAB — RETICULOCYTES
Immature Retic Fract: 10.6 % (ref 2.3–15.9)
RBC.: 2.13 MIL/uL — ABNORMAL LOW (ref 3.87–5.11)
Retic Count, Absolute: 33 10*3/uL (ref 19.0–186.0)
Retic Ct Pct: 1.6 % (ref 0.4–3.1)

## 2020-08-28 LAB — LACTATE DEHYDROGENASE: LDH: 108 U/L (ref 98–192)

## 2020-08-28 LAB — VITAMIN B12: Vitamin B-12: 187 pg/mL (ref 180–914)

## 2020-08-28 LAB — FOLATE: Folate: 52.6 ng/mL (ref >5.9)

## 2020-08-28 LAB — FERRITIN: Ferritin: 305 ng/mL (ref 11–307)

## 2020-08-28 NOTE — Progress Notes (Signed)
[place] [phone]   [phone]  CONSULT NOTE  REFERRING PHYSICIAN: Dr. Oswaldo Milian  REASON FOR CONSULTATION:  87 years old white female with persistent anemia.  HPI Megan Hester is a 87 y.o. female with past medical history significant for hypertension, GERD, history of right breast DCIS in 1999, macular degeneration, paroxysmal atrial fibrillation and currently on treatment with Xarelto as well as history of retinal hemorrhage.  The patient was noticed during physical therapy to have frequent falls and dizzy spells.  Seen by her cardiologist Dr. Gardiner Rhyme and had extensive cardiac work-up that was unremarkable.  During her evaluation she had CBC performed and it showed hemoglobin of 8.6 and hematocrit 26.1% with MCV of 115.  Repeat CBC 6 weeks later on August 17, 2020 showed persistent anemia with hemoglobin of 8.6, hematocrit 24.4% with MCV of 106.  The patient was referred to me today for evaluation and recommendation regarding her condition.  She is currently on multivitamins as well as vitamin D but no iron or vitamin B12 supplements.  She has recurrent nosebleed at least once a week likely from the treatment with Xarelto.  She denied having any blood transfusion in the past.  She has a history of hemorrhagic necrosis with mucosal surface exudate and acute serositis that was seen on the pathology report from resection of an ileum for small bowel obstruction on February 01, 2019.  She had anemia since July 2019 and prior to that her hemoglobin and hematocrit were normal. When seen today the patient continues to complain of the dizzy spells as well as generalized weakness and near falls.  She also has shortness of breath with exertion but no significant chest pain, cough or hemoptysis.  She denied having any nausea, vomiting, diarrhea or constipation.  She lost a lot of weight after her ileum resection for the small bowel obstruction. Family  history significant for mother with stroke.  Father had hypertension and heart disease as well as parkinsonism.  She had a brother who died from lung cancer and a niece with colon cancer. The patient is a widow and has 3 children.  She used to do clerical work.  She was accompanied today by her daughter-in-law Megan Hester.  The patient has no history for smoking, alcohol or drug abuse.  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    Past Surgical History:  Procedure Laterality Date   APPLICATION OF WOUND VAC Bilateral 02/01/2019   Procedure: APPLICATION OF WOUND VAC;  Surgeon: [name], MD;  Location: [facility];  Service: General;  Laterality: Bilateral;   BREAST BIOPSY Right    BREAST LUMPECTOMY Right 1999   RADIATION   CARDIAC CATHETERIZATION  02/05/1999   HYPERDYNAMIC LEFT VENTRICLE WITH EF OF 75-80%   CARDIOVERSION N/A 01/22/2015   Procedure: CARDIOVERSION;  Surgeon: [name], MD;  Location: [facility];  Service: Cardiovascular;  Laterality: N/A;   CARDIOVERSION N/A 02/22/2015   Procedure: CARDIOVERSION;  Surgeon: [name], MD;  Location: [facility];  Service: Cardiovascular;  Laterality: N/A;  DILATION AND CURETTAGE OF UTERUS     ENUCLEATION Right 2012   EP IMPLANTABLE DEVICE N/A 02/25/2016   Procedure: Pacemaker Implant;  Surgeon: [name], MD;  Location: [facility];  Service: Cardiovascular;  Laterality: N/A;   INSERT / REPLACE / REMOVE PACEMAKER     LAPAROSCOPIC SMALL BOWEL RESECTION N/A 02/01/2019   Procedure: LAPAROSCOPIC SMALL BOWEL RESECTION WITH TAP BLOCK;  Surgeon: [name], MD;  Location: [facility];   Service: General;  Laterality: N/A;   LAPAROSCOPY N/A 02/01/2019   Procedure: LAPAROSCOPY DIAGNOSTIC;  Surgeon: [name], MD;  Location: [facility];  Service: General;  Laterality: N/A;   LYSIS OF ADHESION N/A 02/01/2019   Procedure: LYSIS OF ADHESION;  Surgeon: [name], MD;  Location: [facility];  Service: General;  Laterality: N/A;   TONSILLECTOMY      Family History  Problem Relation Age of Onset   Hypertension Father    Heart attack Father    Parkinsonism Father    Diabetes Brother    Heart disease Brother    Lung cancer Brother    Stroke Mother     Social History Social History   Tobacco Use   Smoking status: Never   Smokeless tobacco: Never  Vaping Use   Vaping Use: Never used  Substance Use Topics   Alcohol use: Yes    Alcohol/week: 0.0 standard drinks    Comment: 02/25/2016 "glass of wine/month"   Drug use: No    Allergies  Allergen Reactions   Shellfish Allergy Hives and Swelling    Shrimp and lobster only   Atropine     Causes AFIB   Codeine     Leg pain   Doxycycline     unknown   Fenoprofen Calcium Swelling   Iodinated Diagnostic Agents Hives and Other (See Comments)   Isopto Hyoscine [Scopolamine]     Causes AFIB   Levaquin [Levofloxacin]     Rash & causes AFIB   Other     Other reaction(s): Bleeding (intolerance) Nutrasweet  Naphon - swelling   Oxycodone     Depression - pt tolerates as needed    Penicillins     Has patient had a PCN reaction causing immediate rash, facial/tongue/throat swelling, SOB or lightheadedness with hypotension: unknown Has patient had a PCN reaction causing severe rash involving mucus membranes or skin necrosis: unknown Has patient had a PCN reaction that required hospitalization unknown Has patient had a PCN reaction occurring within the last 10 years: childhood If all of the above answers are "NO", then may proceed with Cephalosporin use. unknown   Sulfa Drugs Cross Reactors     Ankle swelling    Warfarin      Other reaction(s): Bleeding (intolerance) From eyes   Ceclor [Cefaclor] Hives   Tape Rash    Use paper tape    Current Outpatient Medications  Medication Sig Dispense Refill   cetirizine (ZYRTEC) 10 MG tablet Take 10 mg by mouth at bedtime.     Cholecalciferol (VITAMIN D) 1000 UNITS capsule Take 1,000 Units by mouth at bedtime.      clobetasol cream (TEMOVATE) 8.18 % Apply 1 application topically daily as needed (dermatitis).      Glucosamine-Chondroitin (GLUCOSAMINE CHONDR COMPLEX PO) Take 1 capsule by mouth daily.     Hypromellose (ARTIFICIAL TEARS OP) Place 1 drop into the left eye 2 (two) times daily.      metoprolol tartrate (LOPRESSOR) 100 MG tablet TAKE 1 TABLET BY MOUTH TWICE DAILY. [place]  tablet 9   Multiple Vitamins-Minerals (PRESERVISION/LUTEIN PO) Take 1 capsule by mouth 2 (two) times daily.      Polyethyl Glycol-Propyl Glycol (SYSTANE OP) Place 1 drop into the right eye 2 (two) times daily.      polyethylene glycol (MIRALAX / GLYCOLAX) packet Take 17 g by mouth daily as needed for moderate constipation.      QUEtiapine (SEROQUEL) 25 MG tablet Take 25 mg by mouth at bedtime.     sertraline (ZOLOFT) 100 MG tablet Take 100 mg by mouth at bedtime.      XARELTO 15 MG TABS tablet TAKE 1 TABLET ONCE DAILY WITH SUPPER. 30 tablet 5   No current facility-administered medications for this visit.    Review of Systems  Constitutional: positive for fatigue and weight loss Eyes: negative Ears, nose, mouth, throat, and face: positive for epistaxis Respiratory: positive for dyspnea on exertion Cardiovascular: negative Gastrointestinal: negative Genitourinary:negative Integument/breast: negative Hematologic/lymphatic: negative Musculoskeletal:positive for arthralgias Neurological: negative Behavioral/Psych: negative Endocrine: negative Allergic/Immunologic: negative  Physical Exam  PFX:TKWIO, healthy, no distress, well nourished, and well developed SKIN: skin color, texture,  turgor are normal, no rashes or significant lesions HEAD: Normocephalic, No masses, lesions, tenderness or abnormalities EYES: normal, PERRLA, Conjunctiva are pink and non-injected EARS: External ears normal, Canals clear OROPHARYNX:no exudate, no erythema, and lips, buccal mucosa, and tongue normal  NECK: supple, no adenopathy, no JVD LYMPH:  no palpable lymphadenopathy, no hepatosplenomegaly BREAST:not examined LUNGS: clear to auscultation , and palpation HEART: regular rate & rhythm, no murmurs, and no gallops ABDOMEN:abdomen soft, non-tender, normal bowel sounds, and no masses or organomegaly BACK: Back symmetric, no curvature., No CVA tenderness EXTREMITIES:no joint deformities, effusion, or inflammation, no edema  NEURO: alert & oriented x 3 with fluent speech, no focal motor/sensory deficits  PERFORMANCE STATUS: ECOG 1  LABORATORY DATA: Lab Results  Component Value Date   WBC 5.2 08/28/2020   HGB 8.2 (L) 08/28/2020   HCT 24.4 (L) 08/28/2020   MCV 112.4 (H) 08/28/2020   PLT 261 08/28/2020      Chemistry      Component Value Date/Time   NA 140 08/28/2020 1123   NA 140 08/17/2020 1007   K 4.3 08/28/2020 1123   CL 108 08/28/2020 1123   CO2 24 08/28/2020 1123   BUN 21 08/28/2020 1123   BUN 21 08/17/2020 1007   CREATININE 1.02 (H) 08/28/2020 1123      Component Value Date/Time   CALCIUM 8.9 08/28/2020 1123   ALKPHOS 107 08/28/2020 1123   AST 13 (L) 08/28/2020 1123   ALT 7 08/28/2020 1123   BILITOT 0.8 08/28/2020 1123       RADIOGRAPHIC STUDIES: CUP PACEART REMOTE DEVICE CHECK  Result Date: 08/28/2020 Scheduled remote reviewed. Normal device function.  Multiple stored events for AF/Flutter, burden 26.9%.  Poorly controlled histogram, ~70% HR>100.  Pt. prescribed metoprolol, Xarelto.  Unable to tolerate Amiodarone, Flecainide.  Route to triage for further review.  Next remote 91 days. LR   ASSESSMENT: This is a very pleasant 87 years old white female with  persistent macrocytic anemia likely to be secondary to vitamin B12 deficiency after small bowel resection following an obstruction in December of 2020.  Other etiology including myelodysplastic syndrome could not be excluded at this point   PLAN: I had a lengthy discussion with the patient and her daughter-in-law today about her current condition and further investigation to confirm her diagnosis. I order several studies for evaluation of her anemia including repeat CBC which  showed the persistent anemia with hemoglobin of 8.1.  She had normal iron study and ferritin and low normal vitamin B12 but normal serum folate, LDH as well as comprehensive metabolic panel. I also order serum protein electrophoresis with immunofixation that still pending.  The patient also has frequent epistaxis which may be contributing to her anemia as well. I will arrange for the patient to receive vitamin B12 injection weekly for the next 4 weeks and then monthly after that. For the persistent anemia I will arrange for the patient to receive 1 unit of PRBCs transfusion in the next few days. I will see her back for follow-up visit in 2 weeks for reevaluation and repeat CBC and if needed I may consider The patient for a bone marrow biopsy and aspirate to rule out any underlying MDS. The patient was advised to call immediately if she has any other concerning symptoms in the interval. The patient voices understanding of current disease status and treatment options and is in agreement with the current care plan.  All questions were answered. The patient knows to call the clinic with any problems, questions or concerns. We can certainly see the patient much sooner if necessary.  Thank you so much for allowing me to participate in the care of [facility]. I will continue to follow up the patient with you and assist in her care. he total time spent in the appointment was 60 minutes.  Disclaimer: This note was dictated with  voice recognition software. Similar sounding words can inadvertently be transcribed and may not be corrected upon review.   Eilleen Kempf August 28, 2020, 12:04 PM

## 2020-08-29 ENCOUNTER — Ambulatory Visit: Payer: Medicare Other | Admitting: Physical Therapy

## 2020-08-29 ENCOUNTER — Telehealth: Payer: Self-pay | Admitting: Internal Medicine

## 2020-08-29 ENCOUNTER — Telehealth: Payer: Self-pay

## 2020-08-29 NOTE — Telephone Encounter (Signed)
Carelink alert received for "Multiple stored events for AF/Flutter, burden 26.9%.  Poorly controlled histogram, ~70% HR>100.  Pt. prescribed metoprolol, Xarelto.  Unable to tolerate Amiodarone, Flecainide.  Route to triage for further review."  Know history of AT/AF on [place]. Burden is less than previous transmission 05/28/20 (38.8%). Successful telephone encounter to patient and daughter-in-law to assess for s/s of RVR and medication compliance. Patient states she feels "OK". Denies previous s/s of RVR. Assisted patient with sending manual transmission to evaluate current EGM. Presenting EGM AP/VS@61  with intermittent A sensing. Confirmed with patient and daughter-in-law compliance with [place] and BB. Will continue to monitor for now.   Presenting 08/29/20@1230       Presenting 08/27/20

## 2020-08-29 NOTE — Telephone Encounter (Signed)
Scheduled appt per provider request. Called and spoke with patient. Confirmed appt

## 2020-08-30 ENCOUNTER — Other Ambulatory Visit: Payer: Self-pay

## 2020-08-30 ENCOUNTER — Other Ambulatory Visit: Payer: Self-pay | Admitting: *Deleted

## 2020-08-30 ENCOUNTER — Inpatient Hospital Stay: Payer: Medicare Other

## 2020-08-30 DIAGNOSIS — D539 Nutritional anemia, unspecified: Secondary | ICD-10-CM | POA: Diagnosis not present

## 2020-08-30 LAB — PREPARE RBC (CROSSMATCH)

## 2020-08-30 LAB — PROTEIN ELECTROPHORESIS, SERUM, WITH REFLEX
A/G Ratio: 1.8 — ABNORMAL HIGH (ref 0.7–1.7)
Albumin ELP: 4 g/dL (ref 2.9–4.4)
Alpha-1-Globulin: 0.2 g/dL (ref 0.0–0.4)
Alpha-2-Globulin: 0.5 g/dL (ref 0.4–1.0)
Beta Globulin: 0.7 g/dL (ref 0.7–1.3)
Gamma Globulin: 0.8 g/dL (ref 0.4–1.8)
Globulin, Total: 2.2 g/dL (ref 2.2–3.9)
Total Protein ELP: 6.2 g/dL (ref 6.0–8.5)

## 2020-08-30 MED ORDER — SODIUM CHLORIDE 0.9% IV SOLUTION
250.0000 mL | Freq: Once | INTRAVENOUS | Status: AC
  Administered 2020-08-30: 250 mL via INTRAVENOUS
  Filled 2020-08-30: qty 250

## 2020-08-30 MED ORDER — ACETAMINOPHEN 325 MG PO TABS
ORAL_TABLET | ORAL | Status: AC
  Filled 2020-08-30: qty 2

## 2020-08-30 MED ORDER — DIPHENHYDRAMINE HCL 25 MG PO CAPS
ORAL_CAPSULE | ORAL | Status: AC
  Filled 2020-08-30: qty 1

## 2020-08-30 MED ORDER — ACETAMINOPHEN 325 MG PO TABS
650.0000 mg | ORAL_TABLET | Freq: Once | ORAL | Status: AC
  Administered 2020-08-30: 650 mg via ORAL

## 2020-08-30 MED ORDER — DIPHENHYDRAMINE HCL 25 MG PO CAPS
25.0000 mg | ORAL_CAPSULE | Freq: Once | ORAL | Status: AC
  Administered 2020-08-30: 25 mg via ORAL

## 2020-08-30 NOTE — Patient Instructions (Signed)
Blood Transfusion, Adult, Care After This sheet gives you information about how to care for yourself after your procedure. Your doctor may also give you more specific instructions. If youhave problems or questions, contact your doctor. What can I expect after the procedure? After the procedure, it is common to have: Bruising and soreness at the IV site. A fever or chills on the day of the procedure. This may be your body's response to the new blood cells received. A headache. Follow these instructions at home: Insertion site care     Follow instructions from your doctor about how to take care of your insertion site. This is where an IV tube was put into your vein. Make sure you: Wash your hands with soap and water before and after you change your bandage (dressing). If you cannot use soap and water, use hand sanitizer. Change your bandage as told by your doctor. Check your insertion site every day for signs of infection. Check for: Redness, swelling, or pain. Bleeding from the site. Warmth. Pus or a bad smell. General instructions Take over-the-counter and prescription medicines only as told by your doctor. Rest as told by your doctor. Go back to your normal activities as told by your doctor. Keep all follow-up visits as told by your doctor. This is important. Contact a doctor if: You have itching or red, swollen areas of skin (hives). You feel worried or nervous (anxious). You feel weak after doing your normal activities. You have redness, swelling, warmth, or pain around the insertion site. You have blood coming from the insertion site, and the blood does not stop with pressure. You have pus or a bad smell coming from the insertion site. Get help right away if: You have signs of a serious reaction. This may be coming from an allergy or the body's defense system (immune system). Signs include: Trouble breathing or shortness of breath. Swelling of the face or feeling warm  (flushed). Fever or chills. Head, chest, or back pain. Dark pee (urine) or blood in the pee. Widespread rash. Fast heartbeat. Feeling dizzy or light-headed. You may receive your blood transfusion in an outpatient setting. If so, youwill be told whom to contact to report any reactions. These symptoms may be an emergency. Do not wait to see if the symptoms will go away. Get medical help right away. Call your local emergency services (911 in the [place] Do not drive yourself to the hospital. Summary Bruising and soreness at the IV site are common. Check your insertion site every day for signs of infection. Rest as told by your doctor. Go back to your normal activities as told by your doctor. Get help right away if you have signs of a serious reaction. This information is not intended to replace advice given to you by your health care provider. Make sure you discuss any questions you have with your healthcare provider. Document Revised: 07/22/2018 Document Reviewed: 07/22/2018 Elsevier Patient Education  2022 [employer]  

## 2020-08-31 ENCOUNTER — Telehealth: Payer: Self-pay | Admitting: Internal Medicine

## 2020-08-31 LAB — TYPE AND SCREEN
ABO/RH(D): O NEG
Antibody Screen: NEGATIVE
Unit division: 0

## 2020-08-31 LAB — BPAM RBC
Blood Product Expiration Date: 202208092359
ISSUE DATE / TIME: 202207211554
Unit Type and Rh: 9500

## 2020-08-31 NOTE — Telephone Encounter (Signed)
Scheduled per los. Called and spoke with patient. Patient will have son call to confirm appts.

## 2020-09-04 ENCOUNTER — Inpatient Hospital Stay: Payer: Medicare Other

## 2020-09-04 ENCOUNTER — Other Ambulatory Visit: Payer: Self-pay

## 2020-09-04 DIAGNOSIS — D539 Nutritional anemia, unspecified: Secondary | ICD-10-CM | POA: Diagnosis not present

## 2020-09-04 LAB — OCCULT BLOOD X 1 CARD TO LAB, STOOL
Fecal Occult Bld: NEGATIVE
Fecal Occult Bld: NEGATIVE
Fecal Occult Bld: NEGATIVE

## 2020-09-04 MED ORDER — CYANOCOBALAMIN 1000 MCG/ML IJ SOLN
1000.0000 ug | Freq: Once | INTRAMUSCULAR | Status: AC
  Administered 2020-09-04: 1000 ug via INTRAMUSCULAR

## 2020-09-04 MED ORDER — CYANOCOBALAMIN 1000 MCG/ML IJ SOLN
INTRAMUSCULAR | Status: AC
  Filled 2020-09-04: qty 1

## 2020-09-04 NOTE — Patient Instructions (Addendum)

## 2020-09-05 ENCOUNTER — Ambulatory Visit: Payer: Medicare Other | Admitting: Physical Therapy

## 2020-09-06 ENCOUNTER — Other Ambulatory Visit (HOSPITAL_COMMUNITY): Payer: Medicare Other

## 2020-09-11 ENCOUNTER — Inpatient Hospital Stay: Payer: Medicare Other

## 2020-09-11 ENCOUNTER — Inpatient Hospital Stay: Payer: Medicare Other | Attending: Internal Medicine | Admitting: Internal Medicine

## 2020-09-11 ENCOUNTER — Other Ambulatory Visit: Payer: Self-pay

## 2020-09-11 ENCOUNTER — Encounter: Payer: Self-pay | Admitting: Internal Medicine

## 2020-09-11 VITALS — BP 125/73 | HR 69 | Temp 97.7°F | Resp 19 | Ht 64.0 in | Wt 134.8 lb

## 2020-09-11 DIAGNOSIS — D539 Nutritional anemia, unspecified: Secondary | ICD-10-CM

## 2020-09-11 DIAGNOSIS — D62 Acute posthemorrhagic anemia: Secondary | ICD-10-CM

## 2020-09-11 DIAGNOSIS — E538 Deficiency of other specified B group vitamins: Secondary | ICD-10-CM | POA: Insufficient documentation

## 2020-09-11 LAB — CBC WITH DIFFERENTIAL (CANCER CENTER ONLY)
Abs Immature Granulocytes: 0.01 10*3/uL (ref 0.00–0.07)
Basophils Absolute: 0 10*3/uL (ref 0.0–0.1)
Basophils Relative: 0 %
Eosinophils Absolute: 0.2 10*3/uL (ref 0.0–0.5)
Eosinophils Relative: 5 %
HCT: 26.9 % — ABNORMAL LOW (ref 36.0–46.0)
Hemoglobin: 9 g/dL — ABNORMAL LOW (ref 12.0–15.0)
Immature Granulocytes: 0 %
Lymphocytes Relative: 42 %
Lymphs Abs: 1.6 10*3/uL (ref 0.7–4.0)
MCH: 36.1 pg — ABNORMAL HIGH (ref 26.0–34.0)
MCHC: 33.5 g/dL (ref 30.0–36.0)
MCV: 108 fL — ABNORMAL HIGH (ref 80.0–100.0)
Monocytes Absolute: 0.4 10*3/uL (ref 0.1–1.0)
Monocytes Relative: 11 %
Neutro Abs: 1.6 10*3/uL — ABNORMAL LOW (ref 1.7–7.7)
Neutrophils Relative %: 42 %
Platelet Count: 158 10*3/uL (ref 150–400)
RBC: 2.49 MIL/uL — ABNORMAL LOW (ref 3.87–5.11)
RDW: 17.8 % — ABNORMAL HIGH (ref 11.5–15.5)
WBC Count: 3.8 10*3/uL — ABNORMAL LOW (ref 4.0–10.5)
nRBC: 0 % (ref 0.0–0.2)

## 2020-09-11 MED ORDER — CYANOCOBALAMIN 1000 MCG/ML IJ SOLN
INTRAMUSCULAR | Status: AC
  Filled 2020-09-11: qty 1

## 2020-09-11 MED ORDER — CYANOCOBALAMIN 1000 MCG/ML IJ SOLN
1000.0000 ug | Freq: Once | INTRAMUSCULAR | Status: AC
  Administered 2020-09-11: 1000 ug via INTRAMUSCULAR

## 2020-09-11 NOTE — Progress Notes (Signed)
MD Mohamed confirmed pt gets B12 inj today.

## 2020-09-11 NOTE — Progress Notes (Signed)
[facility] [phone]   [phone]  OFFICE PROGRESS NOTE  [name], MD [place] [address]  DIAGNOSIS:  persistent macrocytic anemia likely to be secondary to vitamin B12 deficiency after small bowel resection following an obstruction in December of 2020.  PRIOR THERAPY:None  CURRENT THERAPY: Vitamin B12 injection initially weekly then every 2 weeks and then monthly.  INTERVAL HISTORY: Megan Hester 87 y.o. female returns to the clinic today for follow-up visit accompanied by her daughter-in-law Seth Bake.  The patient is feeling a little bit better after starting the vitamin B12 injection.  Her balance is improving and she resumed her physical therapy again.  She denied having any current chest pain but has shortness of breath with exertion with no cough or hemoptysis.  She denied having any fever or chills.  She has no nausea, vomiting, diarrhea or constipation.  She denied having any significant weight loss or night sweats.  She is here today for evaluation and repeat blood work.  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  MEDICATIONS:  Current Outpatient Medications  Medication Sig Dispense Refill   cetirizine (ZYRTEC) 10 MG tablet Take 10 mg by mouth at bedtime.      Cholecalciferol (VITAMIN D) 1000 UNITS capsule Take 1,000 Units by mouth at bedtime.      clobetasol cream (TEMOVATE) 9.47 % Apply 1 application topically daily as needed (dermatitis).      Glucosamine-Chondroitin (GLUCOSAMINE CHONDR COMPLEX PO) Take 1 capsule by mouth daily.     Hypromellose (ARTIFICIAL TEARS OP) Place 1 drop into the left eye 2 (two) times daily.      metoprolol tartrate (LOPRESSOR) 100 MG tablet TAKE 1 TABLET BY MOUTH TWICE DAILY. 60 tablet 9   Multiple Vitamins-Minerals (PRESERVISION/LUTEIN PO) Take 1 capsule by mouth 2 (two) times daily.      Polyethyl Glycol-Propyl Glycol (SYSTANE OP) Place 1 drop into the right eye 2 (two) times daily.      polyethylene glycol (MIRALAX / GLYCOLAX) packet Take 17 g by mouth daily as needed for moderate constipation.      QUEtiapine (SEROQUEL) 25 MG tablet Take 25 mg by mouth at bedtime.     sertraline (ZOLOFT) 100 MG tablet Take 100 mg by mouth at bedtime.      XARELTO 15 MG TABS tablet TAKE 1 TABLET ONCE DAILY WITH SUPPER. 30 tablet 5   No current facility-administered medications for this visit.    SURGICAL HISTORY:  Past Surgical History:  Procedure Laterality Date   APPLICATION OF WOUND VAC Bilateral 02/01/2019   Procedure: APPLICATION OF WOUND VAC;  Surgeon: [name], MD;  Location: [facility];  Service: General;  Laterality: Bilateral;   BREAST BIOPSY Right    BREAST LUMPECTOMY Right 1999   RADIATION   CARDIAC CATHETERIZATION  02/05/1999   HYPERDYNAMIC  LEFT VENTRICLE WITH EF OF 75-80%   CARDIOVERSION N/A 01/22/2015   Procedure: CARDIOVERSION;  Surgeon: [name], MD;  Location: [facility];  Service: Cardiovascular;  Laterality: N/A;   CARDIOVERSION N/A 02/22/2015   Procedure: CARDIOVERSION;  Surgeon: [name], MD;  Location[facility];  Service: Cardiovascular;  Laterality: N/A;   DILATION AND CURETTAGE OF UTERUS     ENUCLEATION Right 2012   EP IMPLANTABLE DEVICE N/A 02/25/2016   Procedure: Pacemaker  Implant;  Surgeon: [name], MD;  Location: [facility];  Service: Cardiovascular;  Laterality: N/A;   INSERT / REPLACE / REMOVE PACEMAKER     LAPAROSCOPIC SMALL BOWEL RESECTION N/A 02/01/2019   Procedure: LAPAROSCOPIC SMALL BOWEL RESECTION WITH TAP BLOCK;  Surgeon: [name], MD;  [facility];  Service: General;  Laterality: N/A;   LAPAROSCOPY N/A 02/01/2019   Procedure: LAPAROSCOPY DIAGNOSTIC;  Surgeon: [name], MD;  [facility];  Service: General;  Laterality: N/A;   LYSIS OF ADHESION N/A 02/01/2019   Procedure: LYSIS OF ADHESION;  Surgeon: [name], MD;  [facility];  Service: General;  Laterality: N/A;   TONSILLECTOMY      REVIEW OF SYSTEMS:  A comprehensive review of systems was negative except for: Constitutional: positive for fatigue Respiratory: positive for dyspnea on exertion   PHYSICAL EXAMINATION: General appearance: alert, cooperative, fatigued, and no distress Head: Normocephalic, without obvious abnormality, atraumatic Neck: no adenopathy, no JVD, supple, symmetrical, trachea midline, and thyroid not enlarged, symmetric, no tenderness/mass/nodules Lymph nodes: Cervical, supraclavicular, and axillary nodes normal. Resp: clear to auscultation bilaterally Back: symmetric, no curvature. ROM normal. No CVA tenderness. Cardio: regular rate and rhythm, S1, S2 normal, no murmur, click, rub or gallop GI: soft, non-tender; bowel sounds normal; no masses,  no organomegaly Extremities: extremities normal, atraumatic, no cyanosis or edema  ECOG PERFORMANCE STATUS: 1 - Symptomatic but completely ambulatory  Blood pressure 125/73, pulse 69, temperature 97.7 F (36.5 C), temperature source Tympanic, resp. rate 19, height 5' 4"  (1.626 m), weight 134 lb 12.8 oz (61.1 kg), SpO2 100 %.  LABORATORY DATA: Lab Results  Component Value Date   WBC 5.2 08/28/2020   HGB 8.2 (L) 08/28/2020   HCT 24.4 (L) 08/28/2020   MCV 112.4 (H) 08/28/2020   PLT 261  08/28/2020      Chemistry      Component Value Date/Time   NA 140 08/28/2020 1123   NA 140 08/17/2020 1007   K 4.3 08/28/2020 1123   CL 108 08/28/2020 1123   CO2 24 08/28/2020 1123   BUN 21 08/28/2020 1123   BUN 21 08/17/2020 1007   CREATININE 1.02 (H) 08/28/2020 1123      Component Value Date/Time   CALCIUM 8.9 08/28/2020 1123   ALKPHOS 107 08/28/2020 1123   AST 13 (L) 08/28/2020 1123   ALT 7 08/28/2020 1123   BILITOT 0.8 08/28/2020 1123       RADIOGRAPHIC STUDIES: CUP PACEART REMOTE DEVICE CHECK  Result Date: 08/28/2020 Scheduled remote reviewed. Normal device function.  Multiple stored events for AF/Flutter, burden 26.9%.  Poorly controlled histogram, ~70% HR>100.  Pt. prescribed metoprolol, Xarelto.  Unable to tolerate Amiodarone, Flecainide.  Route to triage for further review.  Next remote 91 days. LR   ASSESSMENT AND PLAN: This is a very pleasant 87 years old white female with persistent macrocytic anemia likely secondary to vitamin B12 deficiency but underlying MDS could not be completely excluded at this point. The patient is started treatment  with vitamin B 12 injection weekly for the last 2 weeks and repeat CBC today showed improvement of her hemoglobin and hematocrit but she still have significant anemia. I recommended for her to continue on the vitamin B12 injection monthly for the next 3 months and will repeat blood work at that time.  If she continues to have persistent anemia, we would consider her for a bone marrow biopsy and aspirate for further evaluation of her condition. The patient and her daughter-in-law are in agreement with the current plan. She was advised to call immediately if she has any concerning symptoms in the interval. The patient voices understanding of current disease status and treatment options and is in agreement with the current care plan.  All questions were answered. The patient knows to call the clinic with any problems, questions or  concerns. We can certainly see the patient much sooner if necessary.  The total time spent in the appointment was 20 minutes.  Disclaimer: This note was dictated with voice recognition software. Similar sounding words can inadvertently be transcribed and may not be corrected upon review.

## 2020-09-12 ENCOUNTER — Ambulatory Visit: Payer: Medicare Other | Attending: Internal Medicine | Admitting: Physical Therapy

## 2020-09-12 ENCOUNTER — Other Ambulatory Visit: Payer: Self-pay

## 2020-09-12 ENCOUNTER — Encounter: Payer: Self-pay | Admitting: Physical Therapy

## 2020-09-12 DIAGNOSIS — M6281 Muscle weakness (generalized): Secondary | ICD-10-CM | POA: Insufficient documentation

## 2020-09-12 DIAGNOSIS — R2689 Other abnormalities of gait and mobility: Secondary | ICD-10-CM | POA: Insufficient documentation

## 2020-09-12 DIAGNOSIS — R296 Repeated falls: Secondary | ICD-10-CM | POA: Insufficient documentation

## 2020-09-12 NOTE — Therapy (Signed)
Megan Hester, [place], [address] Phone: [phone]   Fax:  [phone]  Physical Therapy Treatment  Patient Details  Name: Megan Hester MRN: [id number] Date of Birth: December 21, 1930 Referring Provider (PT): [name], MD   Encounter Date: 09/12/2020   PT End of Session - 09/12/20 1152     Visit Number 4    Number of Visits 8    Date for PT Re-Evaluation 09/25/20    Authorization Type U[employer] MCR    Authorization Time Period FOTO by 6th, KX by 15th    Progress Note Due on Visit 10    PT Start Time 1132    PT Stop Time 1215    PT Time Calculation (min) 43 min    Activity Tolerance Patient tolerated treatment well    Behavior During Therapy [facility] for tasks assessed/performed             Past Medical History:  Diagnosis Date   Arthritis    "all over my body" (02/25/2016)   Cancer of right breast ([place]) 1999   DCIS   GERD (gastroesophageal reflux disease)    hx; "when I was working"   Hypertension    Macular degeneration    Paroxysmal atrial fibrillation ([place])    Presence of permanent cardiac pacemaker    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    Past Surgical History:  Procedure Laterality Date   APPLICATION OF WOUND VAC Bilateral 02/01/2019   Procedure: APPLICATION OF WOUND VAC;  Surgeon: [name], MD;  [facility];  Service: General;  Laterality: Bilateral;   BREAST BIOPSY Right    BREAST LUMPECTOMY Right 1999   RADIATION   CARDIAC CATHETERIZATION  02/05/1999   HYPERDYNAMIC LEFT VENTRICLE WITH EF OF 75-80%   CARDIOVERSION N/A 01/22/2015   Procedure: CARDIOVERSION;  Surgeon: [name], MD;  Location: [facility];  Service: Cardiovascular;  Laterality: N/A;   CARDIOVERSION N/A 02/22/2015   Procedure: CARDIOVERSION;  Surgeon: [name], MD;  Location: [facility];  Service: Cardiovascular;  Laterality: N/A;   DILATION AND CURETTAGE OF UTERUS      ENUCLEATION Right 2012   EP IMPLANTABLE DEVICE N/A 02/25/2016   Procedure: Pacemaker Implant;  Surgeon: [name], MD;  Location: [facility];  Service: Cardiovascular;  Laterality: N/A;   INSERT / REPLACE / REMOVE PACEMAKER     LAPAROSCOPIC SMALL BOWEL RESECTION N/A 02/01/2019   Procedure: LAPAROSCOPIC SMALL BOWEL RESECTION WITH TAP BLOCK;  Surgeon: [name], MD;  [facility];  Service: General;  Laterality: N/A;   LAPAROSCOPY N/A 02/01/2019   Procedure: LAPAROSCOPY DIAGNOSTIC;  Surgeon: [name], MD;  [facility];  Service: General;  Laterality: N/A;   LYSIS OF ADHESION N/A 02/01/2019   Procedure: LYSIS OF ADHESION;  Surgeon: [name], MD;  [facility];  Service: General;  Laterality: N/A;   TONSILLECTOMY      There were no vitals filed for this visit.   Subjective Assessment - 09/12/20 1135     Subjective Patient reports that she has been very dizzy due to anemia so has had to cancel past therapy visits and has not been able to do her exercises.    Patient Stated Goals Improve balance and strength to reduce fall risk    Currently in Pain? No/denies                [facility] PT Assessment - 09/12/20 0001  Functional Tests   Functional tests Sit to Stand      Sit to Stand   Comments Patient required BUE assist to stand, was able to control lowering without UE assist                           [facility] Adult PT Treatment/Exercise - 09/12/20 0001       Exercises   Exercises Knee/Hip      Knee/Hip Exercises: Aerobic   Nustep L5 x 5 min with LE      Knee/Hip Exercises: Standing   Heel Raises 2 sets;10 reps    Hip Flexion 2 sets;20 reps    Hip Flexion Limitations alternating march      Knee/Hip Exercises: Seated   Long Arc Quad 2 sets;10 reps    Long Arc Quad Weight 3 lbs.    Sit to Sand 3 sets;5 reps   patient required to use UE for support to stand then slow lowering without UE support     Knee/Hip  Exercises: Supine   Bridges 2 sets;10 reps    Straight Leg Raises 2 sets;10 reps    Other Supine Knee/Hip Exercises Clamshell with red 2 x 10      Knee/Hip Exercises: Sidelying   Hip ABduction 2 sets;10 reps                    PT Education - 09/12/20 1152     Education Details HEP, FOTO    Person(s) Educated Patient;Child(ren)   daughter   Methods Explanation;Demonstration;Verbal cues    Comprehension Verbalized understanding;Returned demonstration;Verbal cues required;Need further instruction              PT Short Term Goals - 09/12/20 1211       PT SHORT TERM GOAL #1   Title Patient will be I with initial HEP to progress with PT    Time 4    Period Weeks    Status On-going    Target Date 08/28/20      PT SHORT TERM GOAL #2   Title PT will assess FOTO by 2nd visit and review with patient by 3rd visit to understand extected progression    Time 4    Period Weeks    Status Achieved    Target Date 08/28/20               PT Long Term Goals - 08/15/20 1153       PT LONG TERM GOAL #1   Title Patient will be I with final HEP to maintain progress from PT    Time 8    Period Weeks    Status New    Target Date 09/25/20      PT LONG TERM GOAL #2   Title Patient will demonstrate bilateral knee strength = 5/5 MMT and hip strength >/= 4/5 MMT to improve steadiness with gait    Time 8    Period Weeks    Status New    Target Date 09/25/20      PT LONG TERM GOAL #3   Title Patient will be able to perform 5xSTS in </= 14 sec to indicate improve strength reduce fall risk    Time 8    Period Weeks    Status New    Target Date 09/25/20      PT LONG TERM GOAL #4   Title Patient will exhibit TUG </= 13.5 sec to indicate reduce  risk of falls    Time 8    Period Weeks    Status New    Target Date 09/25/20      PT LONG TERM GOAL #5   Title Patient will report >/= 51% functional status on FOTO    Time 8    Period Weeks    Status New    Target Date  09/25/20                   Plan - 09/12/20 1153     Clinical Impression Statement Patient tolerated therapy well with no adverse effects. Therapy focused on reviwing and progressing hip and LE strengthening. Patient did exhibit greater difficulty with sit<>stand this visit and states she has regressed with her strengthening due to a decrease in acitvity level over the recent weeks due to anemia. No changes made to HEP this visit, patient and her daughter encouraged to build back in to previous HEP to improve strength, endurance, and balance.  Patient would benefit from continued skilled PT to progress her strength, mobility, and balance in order to reduce fall risk and maximize her functional ability.    PT Treatment/Interventions ADLs/Self Care Home Management;Aquatic Therapy;Cryotherapy;Electrical Stimulation;Iontophoresis '4mg'$ /ml Dexamethasone;Moist Heat;Traction;Ultrasound;Neuromuscular re-education;Balance training;Therapeutic exercise;Therapeutic activities;Functional mobility training;Stair training;Gait training;Patient/family education;Manual techniques;Dry needling;Passive range of motion;Taping;Vasopneumatic Device;Spinal Manipulations;Joint Manipulations    PT Next Visit Plan Review HEP and progress PRN, perform BERG PRN, progress general LE strengthening, balance training moving outside base of support, practice getting up from floor    PT Home Exercise Plan [id number]    Consulted and Agree with Plan of Care Patient;Family member/caregiver    Family Member Consulted Daughter             Patient will benefit from skilled therapeutic intervention in order to improve the following deficits and impairments:  Abnormal gait, Decreased range of motion, Difficulty walking, Decreased activity tolerance, Decreased balance, Postural dysfunction, Decreased strength  Visit Diagnosis: Other abnormalities of gait and mobility  Muscle weakness (generalized)  Repeated  falls     Problem List Patient Active Problem List   Diagnosis Date Noted   Deficiency anemia 08/28/2020   SIRS (systemic inflammatory response syndrome) (HCC)    Acute blood loss anemia    Malnutrition of moderate degree 02/12/2019   Closed loop SBO (small bowel obstruction)  02/01/2019   Sepsis ([place]) 02/01/2019   Chronic anticoagulation 02/01/2019   Cardiac pacemaker in place - [employer] [id number] [address]visa DR MRI 02/01/2019   Rectocele 02/01/2019   DM (diabetes mellitus) ([place]) 02/01/2019   Bowel obstruction ([place]) 02/01/2019   Acute focal ischemia of small intestine ([place]) 02/01/2019   Pseudophakia of left eye 04/03/2016   Sick sinus syndrome ([place]) 02/25/2016   Cancer ([place])    Shortness of breath 11/17/2011   Advanced nonexudative age-related macular degeneration of left eye without subfoveal involvement 04/10/2011   Nuclear cataract 04/10/2011   Age-related macular degeneration 12/03/2010   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 [place], [place], [address] Phone: [phone]   Fax:  [phone]  Name: Megan Hester MRN: [id number] Date of Birth: Oct 10, 1930

## 2020-09-13 ENCOUNTER — Telehealth: Payer: Self-pay | Admitting: Internal Medicine

## 2020-09-13 NOTE — Telephone Encounter (Signed)
Scheduled per los. Called and left msg. Mailed printotu  

## 2020-09-18 ENCOUNTER — Other Ambulatory Visit: Payer: Self-pay

## 2020-09-18 ENCOUNTER — Inpatient Hospital Stay: Payer: Medicare Other

## 2020-09-18 DIAGNOSIS — D539 Nutritional anemia, unspecified: Secondary | ICD-10-CM

## 2020-09-18 DIAGNOSIS — E538 Deficiency of other specified B group vitamins: Secondary | ICD-10-CM | POA: Diagnosis not present

## 2020-09-18 MED ORDER — CYANOCOBALAMIN 1000 MCG/ML IJ SOLN
1000.0000 ug | Freq: Once | INTRAMUSCULAR | Status: AC
  Administered 2020-09-18: 1000 ug via INTRAMUSCULAR

## 2020-09-18 MED ORDER — CYANOCOBALAMIN 1000 MCG/ML IJ SOLN
INTRAMUSCULAR | Status: AC
  Filled 2020-09-18: qty 1

## 2020-09-18 NOTE — Progress Notes (Signed)
Pt arrived for B12 injection and Dr. Lucky Cowboy to give.  From now on, monthly injections. Pt aware.

## 2020-09-18 NOTE — Patient Instructions (Signed)

## 2020-09-19 ENCOUNTER — Ambulatory Visit (HOSPITAL_COMMUNITY): Payer: Medicare Other | Attending: Internal Medicine

## 2020-09-19 DIAGNOSIS — R0602 Shortness of breath: Secondary | ICD-10-CM | POA: Diagnosis present

## 2020-09-19 DIAGNOSIS — I4819 Other persistent atrial fibrillation: Secondary | ICD-10-CM | POA: Insufficient documentation

## 2020-09-19 LAB — ECHOCARDIOGRAM COMPLETE
Area-P 1/2: 3.87 cm2
P 1/2 time: 586 msec
S' Lateral: 3.3 cm

## 2020-09-19 NOTE — Progress Notes (Signed)
Remote pacemaker transmission.   

## 2020-09-20 ENCOUNTER — Other Ambulatory Visit: Payer: Self-pay | Admitting: Internal Medicine

## 2020-09-21 ENCOUNTER — Ambulatory Visit: Payer: Medicare Other | Admitting: Physical Therapy

## 2020-09-21 ENCOUNTER — Encounter: Payer: Self-pay | Admitting: Physical Therapy

## 2020-09-21 ENCOUNTER — Other Ambulatory Visit: Payer: Self-pay

## 2020-09-21 DIAGNOSIS — R296 Repeated falls: Secondary | ICD-10-CM

## 2020-09-21 DIAGNOSIS — M6281 Muscle weakness (generalized): Secondary | ICD-10-CM

## 2020-09-21 DIAGNOSIS — R2689 Other abnormalities of gait and mobility: Secondary | ICD-10-CM | POA: Diagnosis not present

## 2020-09-21 NOTE — Therapy (Signed)
[place], [place], [address] Phone: [phone]   Fax:  [phone]  Physical Therapy Treatment  Patient Details  Name: Megan Hester MRN: [id number] Date of Birth: 11/01/1930 Referring Provider (PT): [name], MD   Encounter Date: 09/21/2020   PT End of Session - 09/21/20 1138     Visit Number 5    Number of Visits 8    Date for PT Re-Evaluation 09/25/20    Authorization Type UHC MCR    Authorization Time Period FOTO by 6th, KX by 15th    Progress Note Due on Visit 10    PT Start Time 1131    PT Stop Time 1212    PT Time Calculation (min) 41 min    Activity Tolerance Patient tolerated treatment well    Behavior During Therapy [facility] for tasks assessed/performed             Past Medical History:  Diagnosis Date   Arthritis    "all over my body" (02/25/2016)   Cancer of right breast ([place]) 1999   DCIS   GERD (gastroesophageal reflux disease)    hx; "when I was working"   Hypertension    Macular degeneration    Paroxysmal atrial fibrillation ([place])    Presence of permanent cardiac pacemaker    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    Past Surgical History:  Procedure Laterality Date   APPLICATION OF WOUND VAC Bilateral 02/01/2019   Procedure: APPLICATION OF WOUND VAC;  Surgeon: [name], MD;  [facility];  Service: General;  Laterality: Bilateral;   BREAST BIOPSY Right    BREAST LUMPECTOMY Right 1999   RADIATION   CARDIAC CATHETERIZATION  02/05/1999   HYPERDYNAMIC LEFT VENTRICLE WITH EF OF 75-80%   CARDIOVERSION N/A 01/22/2015   Procedure: CARDIOVERSION;  Surgeon: [name], MD;  Location: [place];  Service: Cardiovascular;  Laterality: N/A;   CARDIOVERSION N/A 02/22/2015   Procedure: CARDIOVERSION;  Surgeon: [name], MD;  Location: [place];  Service: Cardiovascular;  Laterality: N/A;   DILATION AND CURETTAGE OF UTERUS      ENUCLEATION Right 2012   EP IMPLANTABLE DEVICE N/A 02/25/2016   Procedure: Pacemaker Implant;  Surgeon: [name], MD;  Location: [place] [facility];  Service: Cardiovascular;  Laterality: N/A;   INSERT / REPLACE / REMOVE PACEMAKER     LAPAROSCOPIC SMALL BOWEL RESECTION N/A 02/01/2019   Procedure: LAPAROSCOPIC SMALL BOWEL RESECTION WITH TAP BLOCK;  Surgeon: [name], MD;  [facility];  Service: General;  Laterality: N/A;   LAPAROSCOPY N/A 02/01/2019   Procedure: LAPAROSCOPY DIAGNOSTIC;  Surgeon: [name], MD;  [facility];  Service: General;  Laterality: N/A;   LYSIS OF ADHESION N/A 02/01/2019   Procedure: LYSIS OF ADHESION;  Surgeon: [name], MD;  [facility];  Service: General;  Laterality: N/A;   TONSILLECTOMY      There were no vitals filed for this visit.   Subjective Assessment - 09/21/20 1137     Subjective Patient reports that she is still having trouble with her anemia which limits her activity level and ability to do exercises. She also continues to have the dizziness occasionally.    Patient Stated Goals Improve balance and strength to reduce fall risk    Currently in Pain? No/denies                [facility] PT Assessment - 09/21/20 0001  Sit to Stand   Comments Patient able to perform sit<>stand from standard chair without UE support      Standardized Balance Assessment   Five times sit to stand comments  20                           [facility] Adult PT Treatment/Exercise - 09/21/20 0001       Neuro Re-ed    Neuro Re-ed Details  Romberg on Airex 3 x 30 seconds, 3/4 tandem stance 3 x 30 sec each      Exercises   Exercises Knee/Hip      Knee/Hip Exercises: Aerobic   Nustep L4 x 5 min with LE/UE while taking subjective      Knee/Hip Exercises: Standing   Hip Flexion 2 sets;20 reps    Hip Flexion Limitations alternating march    Hip Abduction 10 reps    Abduction Limitations BUE support    Hip Extension 10  reps    Extension Limitations BUE support      Knee/Hip Exercises: Seated   Long Arc Quad 2 sets;10 reps    Long Arc Quad Weight 3 lbs.    Sit to Sand 3 sets;5 reps;without UE support      Knee/Hip Exercises: Supine   Bridges 2 sets;5 reps    Bridges Limitations red band around knees for gluteal enagement    Straight Leg Raises 2 sets;10 reps    Other Supine Knee/Hip Exercises Clamshell with red 2 x 10      Knee/Hip Exercises: Sidelying   Hip ABduction 2 sets;10 reps                    PT Education - 09/21/20 1138     Education Details HEP    Person(s) Educated Patient    Methods Explanation;Demonstration;Verbal cues    Comprehension Verbalized understanding;Returned demonstration;Verbal cues required;Need further instruction              PT Short Term Goals - 09/12/20 1211       PT SHORT TERM GOAL #1   Title Patient will be I with initial HEP to progress with PT    Time 4    Period Weeks    Status On-going    Target Date 08/28/20      PT SHORT TERM GOAL #2   Title PT will assess FOTO by 2nd visit and review with patient by 3rd visit to understand extected progression    Time 4    Period Weeks    Status Achieved    Target Date 08/28/20               PT Long Term Goals - 08/15/20 1153       PT LONG TERM GOAL #1   Title Patient will be I with final HEP to maintain progress from PT    Time 8    Period Weeks    Status New    Target Date 09/25/20      PT LONG TERM GOAL #2   Title Patient will demonstrate bilateral knee strength = 5/5 MMT and hip strength >/= 4/5 MMT to improve steadiness with gait    Time 8    Period Weeks    Status New    Target Date 09/25/20      PT LONG TERM GOAL #3   Title Patient will be able to perform 5xSTS in </= 14 sec to indicate improve  strength reduce fall risk    Time 8    Period Weeks    Status New    Target Date 09/25/20      PT LONG TERM GOAL #4   Title Patient will exhibit TUG </= 13.5 sec to  indicate reduce risk of falls    Time 8    Period Weeks    Status New    Target Date 09/25/20      PT LONG TERM GOAL #5   Title Patient will report >/= 51% functional status on FOTO    Time 8    Period Weeks    Status New    Target Date 09/25/20                   Plan - 09/21/20 1145     Clinical Impression Statement Patient tolerated therapy well with no adverse effects. Patient able to perform sit<>stand without using UE for support but she did demonstrate a regression with 5xSTS likely due to anemia and recent reduction in acitivty and ability to perform exercises. She did report back pain with bridges so reduced reps and added band to encoruage gluteal engagement. Progressed standing balance to using unstable surface with Airex with good tolerance. She did require occasional UE support for balance. Patient would benefit from continued skilled PT to progress her strength, mobility, and balance in order to reduce fall risk and maximize her functional ability.    PT Treatment/Interventions ADLs/Self Care Home Management;Aquatic Therapy;Cryotherapy;Electrical Stimulation;Iontophoresis '4mg'$ /ml Dexamethasone;Moist Heat;Traction;Ultrasound;Neuromuscular re-education;Balance training;Therapeutic exercise;Therapeutic activities;Functional mobility training;Stair training;Gait training;Patient/family education;Manual techniques;Dry needling;Passive range of motion;Taping;Vasopneumatic Device;Spinal Manipulations;Joint Manipulations    PT Next Visit Plan Review HEP and progress PRN,  progress general LE strengthening, balance training moving outside base of support, practice getting up from floor as able    PT Home Exercise Plan [email or web address]    Consulted and Agree with Plan of Care Patient;Family member/caregiver    Family Member Consulted Daughter             Patient will benefit from skilled therapeutic intervention in order to improve the following deficits and impairments:  Abnormal  gait, Decreased range of motion, Difficulty walking, Decreased activity tolerance, Decreased balance, Postural dysfunction, Decreased strength  Visit Diagnosis: Other abnormalities of gait and mobility  Muscle weakness (generalized)  Repeated falls     Problem List Patient Active Problem List   Diagnosis Date Noted   Deficiency anemia 08/28/2020   SIRS (systemic inflammatory response syndrome) (HCC)    Acute blood loss anemia    Malnutrition of moderate degree 02/12/2019   Closed loop SBO (small bowel obstruction)  02/01/2019   Sepsis ([place]) 02/01/2019   Chronic anticoagulation 02/01/2019   Cardiac pacemaker in place - [employer] [id number] [place] DR MRI 02/01/2019   Rectocele 02/01/2019   DM (diabetes mellitus) ([place]) 02/01/2019   Bowel obstruction ([place]) 02/01/2019   Acute focal ischemia of small intestine ([place]) 02/01/2019   Pseudophakia of left eye 04/03/2016   Sick sinus syndrome ([place]) 02/25/2016   Cancer ([place])    Shortness of breath 11/17/2011   Advanced nonexudative age-related macular degeneration of left eye without subfoveal involvement 04/10/2011   Nuclear cataract 04/10/2011   Age-related macular degeneration 12/03/2010   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

## 2020-09-21 NOTE — Telephone Encounter (Signed)
Prescription refill request for Xarelto received.   Indication: afib  Last office visit: Gardiner Rhyme 08/17/2020 Weight: 61.1 kg  Age: 87 yo  Scr: 1.02, 08/28/2020 CrCl: 37 ml/min   Refill sent.

## 2020-09-25 ENCOUNTER — Ambulatory Visit: Payer: Medicare Other

## 2020-09-28 ENCOUNTER — Other Ambulatory Visit: Payer: Self-pay

## 2020-09-28 ENCOUNTER — Ambulatory Visit: Payer: Medicare Other | Admitting: Physical Therapy

## 2020-09-28 ENCOUNTER — Encounter: Payer: Self-pay | Admitting: Physical Therapy

## 2020-09-28 DIAGNOSIS — R2689 Other abnormalities of gait and mobility: Secondary | ICD-10-CM | POA: Diagnosis not present

## 2020-09-28 DIAGNOSIS — M6281 Muscle weakness (generalized): Secondary | ICD-10-CM

## 2020-09-28 DIAGNOSIS — R296 Repeated falls: Secondary | ICD-10-CM

## 2020-09-28 NOTE — Therapy (Signed)
[place], [place], [address] Phone: [phone]   Fax:  [phone]  Physical Therapy Treatment / ERO  Patient Details  Name: Megan Hester MRN: [id number] Date of Birth: [birth year] Referring Provider (PT): [name], MD   Encounter Date: 09/28/2020   PT End of Session - 09/28/20 1141     Visit Number 6    Number of Visits 12    Date for PT Re-Evaluation 11/09/20    Authorization Type UHC MCR    Authorization Time Period KX by 15th    Progress Note Due on Visit 10    PT Start Time 1130    PT Stop Time 1215    PT Time Calculation (min) 45 min    Activity Tolerance Patient tolerated treatment well    Behavior During Therapy [facility] for tasks assessed/performed             Past Medical History:  Diagnosis Date   Arthritis    "all over my body" (02/25/2016)   Cancer of right breast ([place]) 1999   DCIS   GERD (gastroesophageal reflux disease)    hx; "when I was working"   Hypertension    Macular degeneration    Paroxysmal atrial fibrillation ([place])    Presence of permanent cardiac pacemaker    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    Past Surgical History:  Procedure Laterality Date   APPLICATION OF WOUND VAC Bilateral 02/01/2019   Procedure: APPLICATION OF WOUND VAC;  Surgeon: [name], MD;  [facility];  Service: General;  Laterality: Bilateral;   BREAST BIOPSY Right    BREAST LUMPECTOMY Right 1999   RADIATION   CARDIAC CATHETERIZATION  02/05/1999   HYPERDYNAMIC LEFT VENTRICLE WITH EF OF 75-80%   CARDIOVERSION N/A 01/22/2015   Procedure: CARDIOVERSION;  Surgeon: [name], MD;  Location: [place];  Service: Cardiovascular;  Laterality: N/A;   CARDIOVERSION N/A 02/22/2015   Procedure: CARDIOVERSION;  Surgeon: [name], MD;  Location: [place];  Service: Cardiovascular;  Laterality: N/A;   DILATION AND CURETTAGE OF UTERUS      ENUCLEATION Right 2012   EP IMPLANTABLE DEVICE N/A 02/25/2016   Procedure: Pacemaker Implant;  Surgeon: [name], MD;  Location: [place] [facility];  Service: Cardiovascular;  Laterality: N/A;   INSERT / REPLACE / REMOVE PACEMAKER     LAPAROSCOPIC SMALL BOWEL RESECTION N/A 02/01/2019   Procedure: LAPAROSCOPIC SMALL BOWEL RESECTION WITH TAP BLOCK;  Surgeon: [name], MD;  [facility];  Service: General;  Laterality: N/A;   LAPAROSCOPY N/A 02/01/2019   Procedure: LAPAROSCOPY DIAGNOSTIC;  Surgeon: [name], MD;  [facility];  Service: General;  Laterality: N/A;   LYSIS OF ADHESION N/A 02/01/2019   Procedure: LYSIS OF ADHESION;  Surgeon: [name], MD;  [facility];  Service: General;  Laterality: N/A;   TONSILLECTOMY      There were no vitals filed for this visit.   Subjective Assessment - 09/28/20 1139     Subjective Patient reports she is a little tired today due to being out yesterday. She is doing well and has been working on her exercises. She feels these are helping. Overall she continues to have balance and walking difficulties but feels she is improving.    Patient Stated Goals Improve balance and strength to reduce fall risk    Currently in Pain? No/denies  [facility] PT Assessment - 09/28/20 0001       Assessment   Medical Diagnosis Unsteadiness on feet    Referring Provider (PT) [name], MD      Precautions   Precautions Fall      Restrictions   Weight Bearing Restrictions No      Balance Screen   How many times? No falls since beginning therapy      Prior Function   Level of Independence Independent with basic ADLs      Cognition   Overall Cognitive Status Within Functional Limits for tasks assessed      Observation/Other Assessments   Observations Patient appears in no apparent distress    Focus on Therapeutic Outcomes (FOTO)  43% functional status   daughter assisted with filling out questionnaire      Strength   Right Hip Flexion 4/5    Right Hip Extension 4-/5    Right Hip ABduction 4-/5    Left Hip Flexion 4/5    Left Hip Extension 4-/5    Left Hip ABduction 4-/5    Right Knee Flexion 4+/5    Right Knee Extension 4+/5    Left Knee Flexion 4+/5    Left Knee Extension 4+/5      Standardized Balance Assessment   Five times sit to stand comments  18      Timed Up and Go Test   Normal TUG (seconds) 18                           [facility] Adult PT Treatment/Exercise - 09/28/20 0001       Self-Care   Self-Care Other Self-Care Comments    Other Self-Care Comments  Exam findings and progress toward goals, FOTO, POC update      Neuro Re-ed    Neuro Re-ed Details  Romberg on Airex 3 x 45 seconds      Exercises   Exercises Knee/Hip      Knee/Hip Exercises: Aerobic   Nustep L4 x 5 min with LE/UE while taking subjective      Knee/Hip Exercises: Supine   Bridges 2 sets;5 reps    Straight Leg Raises 2 sets;10 reps      Knee/Hip Exercises: Sidelying   Hip ABduction 2 sets;10 reps                    PT Education - 09/28/20 1140     Education Details POC update, FOTO, HEP    Person(s) Educated Patient    Methods Explanation;Demonstration;Verbal cues    Comprehension Verbalized understanding;Returned demonstration;Verbal cues required;Need further instruction              PT Short Term Goals - 09/28/20 1142       PT SHORT TERM GOAL #1   Title Patient will be I with initial HEP to progress with PT    Baseline patient reports consistency with HEP    Time 4    Period Weeks    Status Achieved    Target Date 08/28/20      PT SHORT TERM GOAL #2   Title PT will assess FOTO by 2nd visit and review with patient by 3rd visit to understand extected progression    Time 4    Period Weeks    Status Achieved    Target Date 08/28/20               PT Long Term Goals -  09/28/20 1142       PT LONG TERM GOAL #1   Title Patient will be I with  final HEP to maintain progress from PT    Baseline progressing toward HEP    Time 6    Period Weeks    Status On-going    Target Date 11/09/20      PT LONG TERM GOAL #2   Title Patient will demonstrate bilateral knee strength = 5/5 MMT and hip strength >/= 4/5 MMT to improve steadiness with gait    Baseline Patient continues to demonstrate gross strength deficit    Time 6    Period Weeks    Status On-going    Target Date 11/09/20      PT LONG TERM GOAL #3   Title Patient will be able to perform 5xSTS in </= 14 sec to indicate improve strength reduce fall risk    Baseline 18 seconds    Time 6    Period Weeks    Status On-going    Target Date 11/09/20      PT LONG TERM GOAL #4   Title Patient will exhibit TUG </= 13.5 sec to indicate reduce risk of falls    Baseline 18 seconds    Time 6    Period Weeks    Status On-going    Target Date 11/09/20      PT LONG TERM GOAL #5   Title Patient will report >/= 51% functional status on FOTO    Baseline 43% functional status    Time 8    Period Weeks    Status On-going    Target Date 11/09/20                   Plan - 09/28/20 1141     Clinical Impression Statement Patient tolerated therapy well with no adverse effects. Overall patient is progressing toward her goals but does continue to demonstrate gross strength and balance deficits. She continues to deal with anemia which patient and her daughter report have limited her ability to participate with therapy and progress. Therapy this visit continued to progress strength and balance exercises and patient was encouraged to perform exercises as able but to focus her time on balance exercises. Patient would benefit from continued skilled PT to progress her strength, mobility, and balance in order to reduce fall risk and maximize her functional ability.    PT Frequency 1x / week    PT Duration 6 weeks    PT Treatment/Interventions ADLs/Self Care Home Management;Aquatic  Therapy;Cryotherapy;Electrical Stimulation;Iontophoresis '4mg'$ /ml Dexamethasone;Moist Heat;Traction;Ultrasound;Neuromuscular re-education;Balance training;Therapeutic exercise;Therapeutic activities;Functional mobility training;Stair training;Gait training;Patient/family education;Manual techniques;Dry needling;Passive range of motion;Taping;Vasopneumatic Device;Spinal Manipulations;Joint Manipulations    PT Next Visit Plan Review HEP and progress PRN,  progress general LE strengthening, balance training with unstable surface and moving outside base of support, practice getting up from floor as able    PT Home Exercise Plan [email or web address]    Consulted and Agree with Plan of Care Patient;Family member/caregiver    Family Member Consulted Daughter             Patient will benefit from skilled therapeutic intervention in order to improve the following deficits and impairments:  Abnormal gait, Decreased range of motion, Difficulty walking, Decreased activity tolerance, Decreased balance, Postural dysfunction, Decreased strength  Visit Diagnosis: Other abnormalities of gait and mobility  Muscle weakness (generalized)  Repeated falls     Problem List Patient Active Problem List   Diagnosis Date Noted   Deficiency  anemia 08/28/2020   SIRS (systemic inflammatory response syndrome) (HCC)    Acute blood loss anemia    Malnutrition of moderate degree 02/12/2019   Closed loop SBO (small bowel obstruction)  02/01/2019   Sepsis ([place]) 02/01/2019   Chronic anticoagulation 02/01/2019   Cardiac pacemaker in place - [place] DR MRI 02/01/2019   Rectocele 02/01/2019   DM (diabetes mellitus) ([place]) 02/01/2019   Bowel obstruction ([place]) 02/01/2019   Acute focal ischemia of small intestine ([place]) 02/01/2019   Pseudophakia of left eye 04/03/2016   Sick sinus syndrome ([place]) 02/25/2016   Cancer ([place])    Shortness of breath 11/17/2011   Advanced nonexudative age-related macular degeneration of  left eye without subfoveal involvement 04/10/2011   Nuclear cataract 04/10/2011   Age-related macular degeneration 12/03/2010   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

## 2020-10-03 ENCOUNTER — Encounter: Payer: Self-pay | Admitting: *Deleted

## 2020-10-03 NOTE — Progress Notes (Signed)
er

## 2020-10-05 ENCOUNTER — Other Ambulatory Visit: Payer: Self-pay

## 2020-10-05 ENCOUNTER — Ambulatory Visit: Payer: Medicare Other | Admitting: Physical Therapy

## 2020-10-05 ENCOUNTER — Encounter: Payer: Self-pay | Admitting: Physical Therapy

## 2020-10-05 VITALS — BP 144/77 | HR 77

## 2020-10-05 DIAGNOSIS — M6281 Muscle weakness (generalized): Secondary | ICD-10-CM

## 2020-10-05 DIAGNOSIS — R296 Repeated falls: Secondary | ICD-10-CM

## 2020-10-05 DIAGNOSIS — R2689 Other abnormalities of gait and mobility: Secondary | ICD-10-CM

## 2020-10-05 NOTE — Therapy (Signed)
[place], [place], [address] Phone: [phone]   Fax:  [phone]  Physical Therapy Treatment  Patient Details  Name: Megan Hester MRN: [id number] Date of Birth: [birth year] Referring Provider (PT): [name], MD   Encounter Date: 10/05/2020   PT End of Session - 10/05/20 1042     Visit Number 7    Number of Visits 12    Date for PT Re-Evaluation 11/09/20    Authorization Type UHC MCR    Authorization Time Period KX by 15th    Progress Note Due on Visit 10    PT Start Time 1045    PT Stop Time 1129    PT Time Calculation (min) 44 min    Activity Tolerance Patient tolerated treatment well    Behavior During Therapy [facility] for tasks assessed/performed             Past Medical History:  Diagnosis Date   Arthritis    "all over my body" (02/25/2016)   Cancer of right breast ([place]) 1999   DCIS   GERD (gastroesophageal reflux disease)    hx; "when I was working"   Hypertension    Macular degeneration    Paroxysmal atrial fibrillation ([place])    Presence of permanent cardiac pacemaker    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    Past Surgical History:  Procedure Laterality Date   APPLICATION OF WOUND VAC Bilateral 02/01/2019   Procedure: APPLICATION OF WOUND VAC;  Surgeon: [name], MD;  [facility];  Service: General;  Laterality: Bilateral;   BREAST BIOPSY Right    BREAST LUMPECTOMY Right 1999   RADIATION   CARDIAC CATHETERIZATION  02/05/1999   HYPERDYNAMIC LEFT VENTRICLE WITH EF OF 75-80%   CARDIOVERSION N/A 01/22/2015   Procedure: CARDIOVERSION;  Surgeon: [name], MD;  Location: [place] [facility];  Service: Cardiovascular;  Laterality: N/A;   CARDIOVERSION N/A 02/22/2015   Procedure: CARDIOVERSION;  Surgeon: [name], MD;  Location: [place];  Service: Cardiovascular;  Laterality: N/A;   DILATION AND CURETTAGE OF UTERUS      ENUCLEATION Right 2012   EP IMPLANTABLE DEVICE N/A 02/25/2016   Procedure: Pacemaker Implant;  Surgeon: [name], MD;  Location: [place] [facility];  Service: Cardiovascular;  Laterality: N/A;   INSERT / REPLACE / REMOVE PACEMAKER     LAPAROSCOPIC SMALL BOWEL RESECTION N/A 02/01/2019   Procedure: LAPAROSCOPIC SMALL BOWEL RESECTION WITH TAP BLOCK;  Surgeon: [name], MD;  [facility];  Service: General;  Laterality: N/A;   LAPAROSCOPY N/A 02/01/2019   Procedure: LAPAROSCOPY DIAGNOSTIC;  Surgeon: [name], MD;  [facility];  Service: General;  Laterality: N/A;   LYSIS OF ADHESION N/A 02/01/2019   Procedure: LYSIS OF ADHESION;  Surgeon: [name], MD;  [facility];  Service: General;  Laterality: N/A;   TONSILLECTOMY      Vitals:   10/05/20 1109  BP: (!) 144/77  Pulse: 77  SpO2: 97%     Subjective Assessment - 10/05/20 1058     Subjective Pt reports that she has been very busy and has not been able to complete her HEP every day.  She does feel more stable in gait since starting therapy. She has 0/10 pain today.    Patient Stated Goals Improve balance and strength to reduce fall risk             [facility] Adult PT Treatment/Exercise:  Therapeutic Exercise to improve core and LE to reduce fall risk.  Exercises selected to maximize therapeutic benefit while minimizing patient fatigue: - nustep L4 5 min while taking subjective (not today) - posterior cable walkout - 5x 10# - LAQ - alternating - 2# - 2x10 ea - Seated march - alternating - 2# - 2x10 ea - seated hip adduction squeeze - 5'' x10  Neuromuscular re-ed to improve balance and reduce fall risk (CGA throughout): - feet together on airex - 30'' bouts - semi tandem on airex - 30'' bouts - rocker board d/f p/f - 20x - with UE support    PT Short Term Goals - 09/28/20 1142       PT SHORT TERM GOAL #1   Title Patient will be I with initial HEP to progress with PT    Baseline patient reports  consistency with HEP    Time 4    Period Weeks    Status Achieved    Target Date 08/28/20      PT SHORT TERM GOAL #2   Title PT will assess FOTO by 2nd visit and review with patient by 3rd visit to understand extected progression    Time 4    Period Weeks    Status Achieved    Target Date 08/28/20               PT Long Term Goals - 09/28/20 1142       PT LONG TERM GOAL #1   Title Patient will be I with final HEP to maintain progress from PT    Baseline progressing toward HEP    Time 6    Period Weeks    Status On-going    Target Date 11/09/20      PT LONG TERM GOAL #2   Title Patient will demonstrate bilateral knee strength = 5/5 MMT and hip strength >/= 4/5 MMT to improve steadiness with gait    Baseline Patient continues to demonstrate gross strength deficit    Time 6    Period Weeks    Status On-going    Target Date 11/09/20      PT LONG TERM GOAL #3   Title Patient will be able to perform 5xSTS in </= 14 sec to indicate improve strength reduce fall risk    Baseline 18 seconds    Time 6    Period Weeks    Status On-going    Target Date 11/09/20      PT LONG TERM GOAL #4   Title Patient will exhibit TUG </= 13.5 sec to indicate reduce risk of falls    Baseline 18 seconds    Time 6    Period Weeks    Status On-going    Target Date 11/09/20      PT LONG TERM GOAL #5   Title Patient will report >/= 51% functional status on FOTO    Baseline 43% functional status    Time 8    Period Weeks    Status On-going    Target Date 11/09/20                   Plan - 10/05/20 1132     Clinical Impression Statement Megan Hester is progressing fair with therapy.  We were unable to complete mat exercises today because patient felt like she might be sick when lying down.  Her vitals were WNL.  Following this we finished the session with seated exercises which were tolerated well.  Will continue with core and hip strengthening and balance.  Continue per  POC.    PT Frequency 1x / week    PT Duration 6 weeks    PT Treatment/Interventions ADLs/Self Care Home Management;Aquatic Therapy;Cryotherapy;Electrical Stimulation;Iontophoresis '4mg'$ /ml Dexamethasone;Moist Heat;Traction;Ultrasound;Neuromuscular re-education;Balance training;Therapeutic exercise;Therapeutic activities;Functional mobility training;Stair training;Gait training;Patient/family education;Manual techniques;Dry needling;Passive range of motion;Taping;Vasopneumatic Device;Spinal Manipulations;Joint Manipulations    PT Next Visit Plan Review HEP and progress PRN,  progress general LE strengthening, balance training with unstable surface and moving outside base of support, practice getting up from floor as able    PT Home Exercise Plan [email or web address]    Consulted and Agree with Plan of Care Patient;Family member/caregiver    Family Member Consulted Daughter             Patient will benefit from skilled therapeutic intervention in order to improve the following deficits and impairments:  Abnormal gait, Decreased range of motion, Difficulty walking, Decreased activity tolerance, Decreased balance, Postural dysfunction, Decreased strength  Visit Diagnosis: Other abnormalities of gait and mobility  Muscle weakness (generalized)  Repeated falls     Problem List Patient Active Problem List   Diagnosis Date Noted   Deficiency anemia 08/28/2020   SIRS (systemic inflammatory response syndrome) (HCC)    Acute blood loss anemia    Malnutrition of moderate degree 02/12/2019   Closed loop SBO (small bowel obstruction)  02/01/2019   Sepsis ([employer]) 02/01/2019   Chronic anticoagulation 02/01/2019   Cardiac pacemaker in place - Medtronic [id number] Advisa DR MRI 02/01/2019   Rectocele 02/01/2019   DM (diabetes mellitus) ([place]) 02/01/2019   Bowel obstruction ([place]) 02/01/2019   Acute focal ischemia of small intestine ([place]) 02/01/2019   Pseudophakia of left eye 04/03/2016   Sick sinus syndrome ([place])  02/25/2016   Cancer ([place])    Shortness of breath 11/17/2011   Advanced nonexudative age-related macular degeneration of left eye without subfoveal involvement 04/10/2011   Nuclear cataract 04/10/2011   Age-related macular degeneration 12/03/2010   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 [place], [place], [address] Phone: [phone]   Fax:  [phone]  Name: Megan Hester MRN: [id number] Date of Birth: 1930/12/21

## 2020-10-12 ENCOUNTER — Ambulatory Visit: Payer: Medicare Other | Attending: Internal Medicine | Admitting: Physical Therapy

## 2020-10-12 ENCOUNTER — Other Ambulatory Visit: Payer: Self-pay

## 2020-10-12 ENCOUNTER — Encounter: Payer: Self-pay | Admitting: Physical Therapy

## 2020-10-12 DIAGNOSIS — R296 Repeated falls: Secondary | ICD-10-CM | POA: Diagnosis present

## 2020-10-12 DIAGNOSIS — M6281 Muscle weakness (generalized): Secondary | ICD-10-CM | POA: Insufficient documentation

## 2020-10-12 DIAGNOSIS — R2689 Other abnormalities of gait and mobility: Secondary | ICD-10-CM | POA: Insufficient documentation

## 2020-10-12 NOTE — Therapy (Signed)
[place], [place], [address] Phone: [phone]   Fax:  [phone]  Physical Therapy Treatment  Patient Details  Name: Megan Hester MRN: [id number] Date of Birth: 1930/12/03 Referring Provider (PT): [name], MD   Encounter Date: 10/12/2020   PT End of Session - 10/12/20 1044     Visit Number 8    Number of Visits 12    Date for PT Re-Evaluation 11/09/20    Authorization Type UHC MCR    Authorization Time Period KX by 15th    Progress Note Due on Visit 10    PT Start Time 1045    PT Stop Time 1128    PT Time Calculation (min) 43 min    Activity Tolerance Patient tolerated treatment well    Behavior During Therapy [facility] for tasks assessed/performed             Past Medical History:  Diagnosis Date   Arthritis    "all over my body" (02/25/2016)   Cancer of right breast ([place]) 1999   DCIS   GERD (gastroesophageal reflux disease)    hx; "when I was working"   Hypertension    Macular degeneration    Paroxysmal atrial fibrillation ([place])    Presence of permanent cardiac pacemaker    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    Past Surgical History:  Procedure Laterality Date   APPLICATION OF WOUND VAC Bilateral 02/01/2019   Procedure: APPLICATION OF WOUND VAC;  Surgeon: [name], MD;  [facility];  Service: General;  Laterality: Bilateral;   BREAST BIOPSY Right    BREAST LUMPECTOMY Right 1999   RADIATION   CARDIAC CATHETERIZATION  02/05/1999   HYPERDYNAMIC LEFT VENTRICLE WITH EF OF 75-80%   CARDIOVERSION N/A 01/22/2015   Procedure: CARDIOVERSION;  Surgeon: [name], MD;  Location: [place];  Service: Cardiovascular;  Laterality: N/A;   CARDIOVERSION N/A 02/22/2015   Procedure: CARDIOVERSION;  Surgeon: [name], MD;  Location: [facility];  Service: Cardiovascular;  Laterality: N/A;   DILATION AND CURETTAGE OF UTERUS      ENUCLEATION Right 2012   EP IMPLANTABLE DEVICE N/A 02/25/2016   Procedure: Pacemaker Implant;  Surgeon: [name], MD;  Location: [place] [facility];  Service: Cardiovascular;  Laterality: N/A;   INSERT / REPLACE / REMOVE PACEMAKER     LAPAROSCOPIC SMALL BOWEL RESECTION N/A 02/01/2019   Procedure: LAPAROSCOPIC SMALL BOWEL RESECTION WITH TAP BLOCK;  Surgeon: [name], MD;  [facility];  Service: General;  Laterality: N/A;   LAPAROSCOPY N/A 02/01/2019   Procedure: LAPAROSCOPY DIAGNOSTIC;  Surgeon: [name], MD;  [facility];  Service: General;  Laterality: N/A;   LYSIS OF ADHESION N/A 02/01/2019   Procedure: LYSIS OF ADHESION;  Surgeon: [name], MD;  [facility];  Service: General;  Laterality: N/A;   TONSILLECTOMY      There were no vitals filed for this visit.   Subjective Assessment - 10/12/20 1045     Subjective Pt  reports that her exercises have been going "slow" but she is able to complete them.  She reports she was very tired after last visit.  She feels more stable in her gait. She has 0/10 pain today.    Patient Stated Goals Improve balance and strength to reduce fall risk             [facility] Adult PT Treatment/Exercise:   Therapeutic Exercise  to improve core and LE to reduce fall risk.  Exercises selected to maximize therapeutic benefit while minimizing patient fatigue: - nustep L4 5 min while taking subjective - posterior cable walkout - 5x 13# - lateral walking with CGA - 2x ~56f - Heel raises on foam - 20x - UE support - marching on foam - 20x - UE support - sit to stand - 2x4  Neuromuscular re-ed to improve balance and reduce fall risk (CGA throughout): - tandem stance - 30'' bouts 3x ea - rocker board d/f p/f - 20x - with UE support    PT Short Term Goals - 09/28/20 1142       PT SHORT TERM GOAL #1   Title Patient will be I with initial HEP to progress with PT    Baseline patient reports consistency with HEP    Time 4     Period Weeks    Status Achieved    Target Date 08/28/20      PT SHORT TERM GOAL #2   Title PT will assess FOTO by 2nd visit and review with patient by 3rd visit to understand extected progression    Time 4    Period Weeks    Status Achieved    Target Date 08/28/20               PT Long Term Goals - 09/28/20 1142       PT LONG TERM GOAL #1   Title Patient will be I with final HEP to maintain progress from PT    Baseline progressing toward HEP    Time 6    Period Weeks    Status On-going    Target Date 11/09/20      PT LONG TERM GOAL #2   Title Patient will demonstrate bilateral knee strength = 5/5 MMT and hip strength >/= 4/5 MMT to improve steadiness with gait    Baseline Patient continues to demonstrate gross strength deficit    Time 6    Period Weeks    Status On-going    Target Date 11/09/20      PT LONG TERM GOAL #3   Title Patient will be able to perform 5xSTS in </= 14 sec to indicate improve strength reduce fall risk    Baseline 18 seconds    Time 6    Period Weeks    Status On-going    Target Date 11/09/20      PT LONG TERM GOAL #4   Title Patient will exhibit TUG </= 13.5 sec to indicate reduce risk of falls    Baseline 18 seconds    Time 6    Period Weeks    Status On-going    Target Date 11/09/20      PT LONG TERM GOAL #5   Title Patient will report >/= 51% functional status on FOTO    Baseline 43% functional status    Time 8    Period Weeks    Status On-going    Target Date 11/09/20                   Plan - 10/12/20 1109     Clinical Impression Statement Pt reports no increase in baseline pain following therapy  HEP was reviewed, but left unchanged    Overall, MJacobs Engineeringis progressing well with therapy.  Today we concentrated on lower extremity strengthening, balance, and increasing activity tolerance.  Pt tolerates session well.  She shows deficits L LE >  R in balance.  She is able to complete tandem stance with only  occasional LOB (requires CGA).  Pt will continue to benefit from skilled physical therapy to address remaining deficits and achieve listed goals.  Continue per POC.    PT Frequency 1x / week    PT Duration 6 weeks    PT Treatment/Interventions ADLs/Self Care Home Management;Aquatic Therapy;Cryotherapy;Electrical Stimulation;Iontophoresis '4mg'$ /ml Dexamethasone;Moist Heat;Traction;Ultrasound;Neuromuscular re-education;Balance training;Therapeutic exercise;Therapeutic activities;Functional mobility training;Stair training;Gait training;Patient/family education;Manual techniques;Dry needling;Passive range of motion;Taping;Vasopneumatic Device;Spinal Manipulations;Joint Manipulations    PT Next Visit Plan Review HEP and progress PRN,  progress general LE strengthening, balance training with unstable surface and moving outside base of support, practice getting up from floor as able    PT Home Exercise Plan [email or web address]    Consulted and Agree with Plan of Care Patient;Family member/caregiver    Family Member Consulted Daughter             Patient will benefit from skilled therapeutic intervention in order to improve the following deficits and impairments:  Abnormal gait, Decreased range of motion, Difficulty walking, Decreased activity tolerance, Decreased balance, Postural dysfunction, Decreased strength  Visit Diagnosis: Other abnormalities of gait and mobility  Muscle weakness (generalized)  Repeated falls     Problem List Patient Active Problem List   Diagnosis Date Noted   Deficiency anemia 08/28/2020   SIRS (systemic inflammatory response syndrome) (HCC)    Acute blood loss anemia    Malnutrition of moderate degree 02/12/2019   Closed loop SBO (small bowel obstruction)  02/01/2019   Sepsis ([place]) 02/01/2019   Chronic anticoagulation 02/01/2019   Cardiac pacemaker in place - Medtronic [id number] Advisa DR MRI 02/01/2019   Rectocele 02/01/2019   DM (diabetes mellitus) ([place]) 02/01/2019    Bowel obstruction ([place]) 02/01/2019   Acute focal ischemia of small intestine ([place]) 02/01/2019   Pseudophakia of left eye 04/03/2016   Sick sinus syndrome ([place]) 02/25/2016   Cancer ([place])    Shortness of breath 11/17/2011   Advanced nonexudative age-related macular degeneration of left eye without subfoveal involvement 04/10/2011   Nuclear cataract 04/10/2011   Age-related macular degeneration 12/03/2010   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 [place], [place], [address] Phone: [phone]   Fax:  [phone]  Name: Megan Hester MRN: [id number] Date of Birth: September 22, 1930

## 2020-10-19 ENCOUNTER — Ambulatory Visit: Payer: Medicare Other | Admitting: Physical Therapy

## 2020-10-23 ENCOUNTER — Other Ambulatory Visit: Payer: Self-pay

## 2020-10-23 ENCOUNTER — Inpatient Hospital Stay: Payer: Medicare Other | Attending: Internal Medicine

## 2020-10-23 DIAGNOSIS — E538 Deficiency of other specified B group vitamins: Secondary | ICD-10-CM | POA: Diagnosis not present

## 2020-10-23 DIAGNOSIS — D539 Nutritional anemia, unspecified: Secondary | ICD-10-CM

## 2020-10-23 MED ORDER — CYANOCOBALAMIN 1000 MCG/ML IJ SOLN
1000.0000 ug | Freq: Once | INTRAMUSCULAR | Status: AC
  Administered 2020-10-23: 1000 ug via INTRAMUSCULAR
  Filled 2020-10-23: qty 1

## 2020-10-23 NOTE — Patient Instructions (Signed)

## 2020-10-24 ENCOUNTER — Telehealth: Payer: Self-pay | Admitting: Internal Medicine

## 2020-10-24 NOTE — Telephone Encounter (Signed)
She takes Xarelto for afib, looks like we never received a clearance request to clear her to hold Xarelto in the first place for her procedure. Her CHADS2VASc score is 58 (age x2, sex, HTN, MD). Ok to hold Xarelto an extra 2 days or so to see if this helps with bleeding/healing.

## 2020-10-24 NOTE — Telephone Encounter (Signed)
Called and spoke with the son Richardson Landry (on [place]) Explained to him that I have called the patients number twice and someone is answering but I can't hear them, not sure if they can hear me. Wanted to give the advisement for his mother before the end of the day. Advised to hold Xarelto for 2 days to see if this helps with the bleeding. Verbalized understanding and thankful for the call. He will contact his mother and let her know this information and will call us back with any further questions.

## 2020-10-24 NOTE — Telephone Encounter (Signed)
Pt c/o medication issue:  1. Name of Medication:  XARELTO 15 MG TABS tablet  2. How are you currently taking this medication (dosage and times per day)?  As prescribed  3. Are you having a reaction (difficulty breathing--STAT)?  No   4. What is your medication issue?   Patient states on 10/19/20 she had 4-5 teeth pulled. She stopped Xarelto 2 days prior, but she went back on it afterwards. She states she is still bleeding today. She states she also saw the dentist again today and they advised her that she is still bleeding because her gums are healing slowly. She would like to know if it is alright for her to cut back on her Xarelto for a little longer. Please advise.

## 2020-10-26 ENCOUNTER — Ambulatory Visit: Payer: Medicare Other | Admitting: Physical Therapy

## 2020-11-02 ENCOUNTER — Encounter: Payer: Medicare Other | Admitting: Physical Therapy

## 2020-11-06 ENCOUNTER — Telehealth: Payer: Self-pay | Admitting: Internal Medicine

## 2020-11-06 NOTE — Telephone Encounter (Signed)
Pt c/o medication issue:  1. Name of Medication: XARELTO 15 MG TABS tablet  2. How are you currently taking this medication (dosage and times per day)? TAKE 1 TABLET ONCE DAILY WITH SUPPER.  3. Are you having a reaction (difficulty breathing--STAT)? no  4. What is your medication issue? When should she take her medicine after her procedure on Thursday 9/29.

## 2020-11-06 NOTE — Telephone Encounter (Signed)
The patient is having 4 teeth pulled. Advised that the dentist removing the teeth would advise post procedure.  Verbalized understanding.

## 2020-11-07 ENCOUNTER — Other Ambulatory Visit: Payer: Self-pay

## 2020-11-07 ENCOUNTER — Ambulatory Visit: Payer: Medicare Other | Admitting: Physical Therapy

## 2020-11-07 DIAGNOSIS — R2689 Other abnormalities of gait and mobility: Secondary | ICD-10-CM

## 2020-11-07 DIAGNOSIS — R296 Repeated falls: Secondary | ICD-10-CM

## 2020-11-07 DIAGNOSIS — M6281 Muscle weakness (generalized): Secondary | ICD-10-CM

## 2020-11-07 NOTE — Therapy (Signed)
[place], [place], [address] Phone: [phone]   Fax:  [phone]  Physical Therapy Treatment  Patient Details  Name: Megan Hester MRN: [id number] Date of Birth: 01/05/1931 Referring Provider (PT): [name], MD   Encounter Date: 11/07/2020   PT End of Session - 11/07/20 1418     Visit Number 9    Number of Visits 12    Date for PT Re-Evaluation 11/09/20    Authorization Type UHC MCR    Authorization Time Period KX by 15th    Progress Note Due on Visit 10    PT Start Time 1418    PT Stop Time 1458    PT Time Calculation (min) 40 min             Past Medical History:  Diagnosis Date   Arthritis    "all over my body" (02/25/2016)   Cancer of right breast ([place]) 1999   DCIS   GERD (gastroesophageal reflux disease)    hx; "when I was working"   Hypertension    Macular degeneration    Paroxysmal atrial fibrillation ([facility])    Presence of permanent cardiac pacemaker    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    Past Surgical History:  Procedure Laterality Date   APPLICATION OF WOUND VAC Bilateral 02/01/2019   Procedure: APPLICATION OF WOUND VAC;  Surgeon: [name], MD;  Location: [facility];  Service: General;  Laterality: Bilateral;   BREAST BIOPSY Right    BREAST LUMPECTOMY Right 1999   RADIATION   CARDIAC CATHETERIZATION  02/05/1999   HYPERDYNAMIC LEFT VENTRICLE WITH EF OF 75-80%   CARDIOVERSION N/A 01/22/2015   Procedure: CARDIOVERSION;  Surgeon: [name], MD;  Location: [facility];  Service: Cardiovascular;  Laterality: N/A;   CARDIOVERSION N/A 02/22/2015   Procedure: CARDIOVERSION;  Surgeon: [name], MD;  Location: [facility];  Service: Cardiovascular;  Laterality: N/A;   DILATION AND CURETTAGE OF UTERUS     ENUCLEATION Right 2012   EP IMPLANTABLE DEVICE N/A 02/25/2016   Procedure: Pacemaker Implant;  Surgeon: [name], MD;   Location: [facility];  Service: Cardiovascular;  Laterality: N/A;   INSERT / REPLACE / REMOVE PACEMAKER     LAPAROSCOPIC SMALL BOWEL RESECTION N/A 02/01/2019   Procedure: LAPAROSCOPIC SMALL BOWEL RESECTION WITH TAP BLOCK;  Surgeon: [name], MD;  [facility];  Service: General;  Laterality: N/A;   LAPAROSCOPY N/A 02/01/2019   Procedure: LAPAROSCOPY DIAGNOSTIC;  Surgeon: [name], MD;  [facility];  Service: General;  Laterality: N/A;   LYSIS OF ADHESION N/A 02/01/2019   Procedure: LYSIS OF ADHESION;  Surgeon: [name], MD;  [facility];  Service: General;  Laterality: N/A;   TONSILLECTOMY      There were no vitals filed for this visit.   Subjective Assessment - 11/07/20 1424     Subjective per pt's daugther there was a gap in treatment due to having a dental procedure, and she is having another on tomorrow which there will be another gap in treatment but would like to return back to PT after. pt continues to report no pain today and notes she has had no falls since she was last seen on 9/2.2022.    Currently in Pain? No/denies  [facility] Adult PT Treatment/Exercise:  Therapeutic Exercise: Nu-step L5 x 5 min LE only LAQ 2 x 12 with 3# Seated marching 2 x 20 with 3#, alternating L/R Sit to stand 2 x 10 Standing hip abduction 2 x 10  Manual Therapy:  N/A  Neuromuscular re-ed: Corner balance Rhomberg 2 x 30 sec with eyes open, 2 x 30 sec with eyes closed Rhomberg cross body reaching outside BOS 1 x 20 Tandem 1 x 30 second switching lead leg, 1 x 20 sec eyes closed (mod postural sway noted)  Therapeutic Activity: N/A  Self Care: N/A  ITEMS NOT PERFORMED TODAY:              PT Education - 11/07/20 1440     Education Details Reviewed HEp and updatedo today for corner balance.    Person(s) Educated Patient    Methods Explanation;Verbal cues;Handout    Comprehension Verbalized  understanding;Verbal cues required              PT Short Term Goals - 09/28/20 1142       PT SHORT TERM GOAL #1   Title Patient will be I with initial HEP to progress with PT    Baseline patient reports consistency with HEP    Time 4    Period Weeks    Status Achieved    Target Date 08/28/20      PT SHORT TERM GOAL #2   Title PT will assess FOTO by 2nd visit and review with patient by 3rd visit to understand extected progression    Time 4    Period Weeks    Status Achieved    Target Date 08/28/20               PT Long Term Goals - 09/28/20 1142       PT LONG TERM GOAL #1   Title Patient will be I with final HEP to maintain progress from PT    Baseline progressing toward HEP    Time 6    Period Weeks    Status On-going    Target Date 11/09/20      PT LONG TERM GOAL #2   Title Patient will demonstrate bilateral knee strength = 5/5 MMT and hip strength >/= 4/5 MMT to improve steadiness with gait    Baseline Patient continues to demonstrate gross strength deficit    Time 6    Period Weeks    Status On-going    Target Date 11/09/20      PT LONG TERM GOAL #3   Title Patient will be able to perform 5xSTS in </= 14 sec to indicate improve strength reduce fall risk    Baseline 18 seconds    Time 6    Period Weeks    Status On-going    Target Date 11/09/20      PT LONG TERM GOAL #4   Title Patient will exhibit TUG </= 13.5 sec to indicate reduce risk of falls    Baseline 18 seconds    Time 6    Period Weeks    Status On-going    Target Date 11/09/20      PT LONG TERM GOAL #5   Title Patient will report >/= 51% functional status on FOTO    Baseline 43% functional status    Time 8    Period Weeks    Status On-going    Target Date 11/09/20  Plan - 11/07/20 1429     Clinical Impression Statement pt arrives since her last session on 9/2 due to a dental procedure which per pt's daughter she is going to have another done soon  and will need some time to take a break. continued working on hip/ knee strengthening in both seated and standing positioning. worked on balance in corner so it can be reproduced at home with narrow BOS and reaching outside her BOS. Overall she is doing well, plan to see her for 1 more visit and determine if more PT is warranted.    Examination-Participation Restrictions Cleaning;Community Activity;Shop;Laundry;Meal Prep    PT Treatment/Interventions ADLs/Self Care Home Management;Aquatic Therapy;Cryotherapy;Electrical Stimulation;Iontophoresis 4mg /ml Dexamethasone;Moist Heat;Traction;Ultrasound;Neuromuscular re-education;Balance training;Therapeutic exercise;Therapeutic activities;Functional mobility training;Stair training;Gait training;Patient/family education;Manual techniques;Dry needling;Passive range of motion;Taping;Vasopneumatic Device;Spinal Manipulations;Joint Manipulations    PT Next Visit Plan ERO,  progress general LE strengthening, balance training with unstable surface and moving outside base of support, practice getting up from floor as able    PT Home Exercise Plan [email or web address]    Consulted and Agree with Plan of Care Patient;Family member/caregiver             Patient will benefit from skilled therapeutic intervention in order to improve the following deficits and impairments:  Abnormal gait, Decreased range of motion, Difficulty walking, Decreased activity tolerance, Decreased balance, Postural dysfunction, Decreased strength  Visit Diagnosis: Other abnormalities of gait and mobility  Muscle weakness (generalized)  Repeated falls     Problem List Patient Active Problem List   Diagnosis Date Noted   Deficiency anemia 08/28/2020   SIRS (systemic inflammatory response syndrome) (HCC)    Acute blood loss anemia    Malnutrition of moderate degree 02/12/2019   Closed loop SBO (small bowel obstruction)  02/01/2019   Sepsis ([facility]) 02/01/2019   Chronic anticoagulation  02/01/2019   Cardiac pacemaker in place - Medtronic [id number] Advisa DR MRI 02/01/2019   Rectocele 02/01/2019   DM (diabetes mellitus) ([place]) 02/01/2019   Bowel obstruction ([place]) 02/01/2019   Acute focal ischemia of small intestine ([place]) 02/01/2019   Pseudophakia of left eye 04/03/2016   Sick sinus syndrome ([facility]) 02/25/2016   Cancer ([facility])    Shortness of breath 11/17/2011   Advanced nonexudative age-related macular degeneration of left eye without subfoveal involvement 04/10/2011   Nuclear cataract 04/10/2011   Age-related macular degeneration 12/03/2010   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 [place], [place], [address] Phone: [phone]   Fax:  [phone]  Name: Megan Hester MRN: [id number] Date of Birth: 1930/04/05

## 2020-11-23 ENCOUNTER — Ambulatory Visit: Payer: Medicare Other | Attending: Internal Medicine | Admitting: Physical Therapy

## 2020-11-23 ENCOUNTER — Encounter: Payer: Self-pay | Admitting: Physical Therapy

## 2020-11-23 ENCOUNTER — Other Ambulatory Visit: Payer: Self-pay

## 2020-11-23 DIAGNOSIS — R2689 Other abnormalities of gait and mobility: Secondary | ICD-10-CM | POA: Diagnosis not present

## 2020-11-23 DIAGNOSIS — M6281 Muscle weakness (generalized): Secondary | ICD-10-CM | POA: Diagnosis present

## 2020-11-23 DIAGNOSIS — R296 Repeated falls: Secondary | ICD-10-CM | POA: Diagnosis present

## 2020-11-23 NOTE — Therapy (Addendum)
[place], [place], [address] Phone: [phone]   Fax:  [phone]  Physical Therapy Treatment / ERO / Discharge  Patient Details  Name: Megan Hester MRN: [id number] Date of Birth: 08-Nov-1930 Referring Provider (PT): [name], MD   Encounter Date: 11/23/2020   PT End of Session - 11/23/20 1105     Visit Number 10    Number of Visits 16    Date for PT Re-Evaluation 01/04/21    Authorization Type UHC MCR    Authorization Time Period KX by 15th    Progress Note Due on Visit 20    PT Start Time 1045    PT Stop Time 1130    PT Time Calculation (min) 45 min    Equipment Utilized During Treatment Gait belt    Activity Tolerance Patient tolerated treatment well    Behavior During Therapy WFL for tasks assessed/performed             Past Medical History:  Diagnosis Date   Arthritis    "all over my body" (02/25/2016)   Cancer of right breast ([place]) 1999   DCIS   GERD (gastroesophageal reflux disease)    hx; "when I was working"   Hypertension    Macular degeneration    Paroxysmal atrial fibrillation ([place])    Presence of permanent cardiac pacemaker    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    Past Surgical History:  Procedure Laterality Date   APPLICATION OF WOUND VAC Bilateral 02/01/2019   Procedure: APPLICATION OF WOUND VAC;  Surgeon: [name], MD;  [facility];  Service: General;  Laterality: Bilateral;   BREAST BIOPSY Right    BREAST LUMPECTOMY Right 1999   RADIATION   CARDIAC CATHETERIZATION  02/05/1999   HYPERDYNAMIC LEFT VENTRICLE WITH EF OF 75-80%   CARDIOVERSION N/A 01/22/2015   Procedure: CARDIOVERSION;  Surgeon: [name], MD;  Location: [facility];  Service: Cardiovascular;  Laterality: N/A;   CARDIOVERSION N/A 02/22/2015   Procedure: CARDIOVERSION;  Surgeon: [name], MD;  Location: [facility];  Service:  Cardiovascular;  Laterality: N/A;   DILATION AND CURETTAGE OF UTERUS     ENUCLEATION Right 2012   EP IMPLANTABLE DEVICE N/A 02/25/2016   Procedure: Pacemaker Implant;  Surgeon: [name], MD;  Location: [facility];  Service: Cardiovascular;  Laterality: N/A;   INSERT / REPLACE / REMOVE PACEMAKER     LAPAROSCOPIC SMALL BOWEL RESECTION N/A 02/01/2019   Procedure: LAPAROSCOPIC SMALL BOWEL RESECTION WITH TAP BLOCK;  Surgeon: [name], MD;  [facility];  Service: General;  Laterality: N/A;   LAPAROSCOPY N/A 02/01/2019   Procedure: LAPAROSCOPY DIAGNOSTIC;  Surgeon: [name], MD;  [facility];  Service: General;  Laterality: N/A;   LYSIS OF ADHESION N/A 02/01/2019   Procedure: LYSIS OF ADHESION;  Surgeon: [name], MD;  [facility];  Service: General;  Laterality: N/A;   TONSILLECTOMY      There were no vitals filed for this visit.   Subjective Assessment - 11/23/20 1053     Subjective Patient reports she is doing well, she has been able to work on her exercises a little bit but not a lot. She reports that she had dental procedure which caused gaps in therapy, states anemia has gotten a lot better. She feels like therapy has been beneficial so would like to continue.    Limitations Standing;Walking;House hold activities  Patient Stated Goals Improve balance and strength to reduce fall risk    Currently in Pain? No/denies                [facility] PT Assessment - 11/23/20 0001       Assessment   Medical Diagnosis Unsteadiness on feet    Referring Provider (PT) [name], MD      Precautions   Precautions Fall      Restrictions   Weight Bearing Restrictions No      Balance Screen   Has the patient fallen in the past 6 months No      Prior Function   Level of Independence Independent with basic ADLs      Observation/Other Assessments   Focus on Therapeutic Outcomes (FOTO)  46% functional status      Strength   Right Hip Flexion  4/5    Right Hip Extension 4-/5    Right Hip ABduction 4-/5    Left Hip Flexion 4/5    Left Hip Extension 4-/5    Left Hip ABduction 4-/5    Right Knee Flexion 4+/5    Right Knee Extension 4+/5    Left Knee Flexion 4+/5    Left Knee Extension 4+/5      Standardized Balance Assessment   Five times sit to stand comments  16      Timed Up and Go Test   Normal TUG (seconds) 14                           [facility] Adult PT Treatment/Exercise - 11/23/20 0001       Neuro Re-ed    Neuro Re-ed Details  Tandem 2 x 30 sec each      Exercises   Exercises Knee/Hip      Knee/Hip Exercises: Standing   Heel Raises Limitations reviewed for HEP    Knee Flexion Limitations reviewed for HEP    Hip Flexion Limitations reviewed for HEP    Abduction Limitations reviewed for HEP    Extension Limitations reviewed for HEP      Knee/Hip Exercises: Seated   Long Arc Quad 15 reps    Sit to [employer] 10 reps      Knee/Hip Exercises: Supine   Bridges 10 reps    Straight Leg Raises 10 reps      Knee/Hip Exercises: Sidelying   Hip ABduction 10 reps                     PT Education - 11/23/20 1100     Education Details POC update, HEP update, FOTO    Person(s) Educated Patient    Methods Explanation;Demonstration;Verbal cues;Handout;Tactile cues    Comprehension Verbalized understanding;Returned demonstration;Verbal cues required;Tactile cues required;Need further instruction              PT Short Term Goals - 09/28/20 1142       PT SHORT TERM GOAL #1   Title Patient will be I with initial HEP to progress with PT    Baseline patient reports consistency with HEP    Time 4    Period Weeks    Status Achieved    Target Date 08/28/20      PT SHORT TERM GOAL #2   Title PT will assess FOTO by 2nd visit and review with patient by 3rd visit to understand extected progression    Time 4    Period Weeks  Status Achieved    Target Date 08/28/20                PT Long Term Goals - 11/23/20 1107       PT LONG TERM GOAL #1   Title Patient will be I with final HEP to maintain progress from PT    Baseline progressing toward final HEP, updated this visit    Time 6    Period Weeks    Status On-going    Target Date 01/04/21      PT LONG TERM GOAL #2   Title Patient will demonstrate bilateral knee strength = 5/5 MMT and hip strength >/= 4/5 MMT to improve steadiness with gait    Baseline Patient continues to demonstrate gross strength deficits    Time 6    Period Weeks    Status On-going    Target Date 01/04/21      PT LONG TERM GOAL #3   Title Patient will be able to perform 5xSTS in </= 14 sec to indicate improve strength reduce fall risk    Baseline 16 seconds    Time 6    Period Weeks    Status On-going    Target Date 01/04/21      PT LONG TERM GOAL #4   Title Patient will exhibit TUG </= 13.5 sec to indicate reduce risk of falls    Baseline 14 seconds    Time 6    Period Weeks    Status On-going    Target Date 01/04/21      PT LONG TERM GOAL #5   Title Patient will report >/= 51% functional status on FOTO    Baseline 46% functional status    Time 8    Period Weeks    Status On-going    Target Date 01/04/21                   Plan - 11/23/20 1113     Clinical Impression Statement Patient tolerated therapy well with no adverse effects. She is progressing well toward LTGs, demonstrating improvement in her strength, 5xSTS, TUG, and balance training ability. She has still not achieved her LTGs indicating safety and reduced risk of falls, but is motivated to continue with therapy and perform HEP consistently. Overall she seems to be doing much better and has recovered from recent medical issues that have delayed therapy. HEP was progressed this visit to further progress strength and balance. She does continue to exhibit increased sway with standing balance training and some unsteadiness with gait. Patient would benefit from  continued skilled PT to progress her strength, mobility, and balance in order to reduce fall risk and maximize her functional ability.    PT Frequency 1x / week    PT Duration 6 weeks    PT Treatment/Interventions ADLs/Self Care Home Management;Aquatic Therapy;Cryotherapy;Electrical Stimulation;Iontophoresis 76m/ml Dexamethasone;Moist Heat;Traction;Ultrasound;Neuromuscular re-education;Balance training;Therapeutic exercise;Therapeutic activities;Functional mobility training;Stair training;Gait training;Patient/family education;Manual techniques;Dry needling;Passive range of motion;Taping;Vasopneumatic Device;Spinal Manipulations;Joint Manipulations    PT Next Visit Plan Review HEP and progress PRN, progress general LE strengthening, balance training with unstable surface and moving outside base of support, practice getting up from floor as able    PT Home Exercise Plan [email or web address]    Consulted and Agree with Plan of Care Patient;Family member/caregiver    Family Member Consulted Daughter             Patient will benefit from skilled therapeutic intervention in order to improve the following deficits and impairments:  Abnormal gait, Decreased range of motion, Difficulty walking, Decreased activity tolerance, Decreased balance, Postural dysfunction, Decreased strength  Visit Diagnosis: Other abnormalities of gait and mobility  Muscle weakness (generalized)  Repeated falls     Problem List Patient Active Problem List   Diagnosis Date Noted   Deficiency anemia 08/28/2020   SIRS (systemic inflammatory response syndrome) (HCC)    Acute blood loss anemia    Malnutrition of moderate degree 02/12/2019   Closed loop SBO (small bowel obstruction)  02/01/2019   Sepsis ([place]) 02/01/2019   Chronic anticoagulation 02/01/2019   Cardiac pacemaker in place - Medtronic [id number] Advisa DR MRI 02/01/2019   Rectocele 02/01/2019   DM (diabetes mellitus) ([place]) 02/01/2019   Bowel obstruction ([place])  02/01/2019   Acute focal ischemia of small intestine ([place]) 02/01/2019   Pseudophakia of left eye 04/03/2016   Sick sinus syndrome ([place]) 02/25/2016   Cancer ([place])    Shortness of breath 11/17/2011   Advanced nonexudative age-related macular degeneration of left eye without subfoveal involvement 04/10/2011   Nuclear cataract 04/10/2011   Age-related macular degeneration 12/03/2010   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   History of enucleation of right eyeball 12/03/2010   Hypertension 09/12/2010   A-fib ([place]) 07/16/2010   History of adenomatous polyp of colon 01/31/2001    Hilda Blades, PT, DPT, LAT, ATC 11/23/20  12:31 PM Phone: [phone] Fax: [facility] [place], [place], [address] Phone: [phone]   Fax:  [phone]  Name: Megan Hester MRN: [id number] Date of Birth: 02-05-1931   PHYSICAL THERAPY DISCHARGE SUMMARY  Visits from Start of Care: 10  Current functional level related to goals / functional outcomes: See above   Remaining deficits: See above   Education / Equipment: HEP   Patient agrees to discharge. Patient goals were partially met. Patient is being discharged due to not returning since the last visit.

## 2020-11-23 NOTE — Patient Instructions (Signed)
Access Code: [email or web address] URL: [email or web address] Date: 11/23/2020 Prepared by: Hilda Blades  Exercises Straight Leg Raise - 1 x daily - 7 x weekly - 2 sets - 10 reps Bridge - 1 x daily - 7 x weekly - 2 sets - 10 reps Sidelying Hip Abduction - 1 x daily - 7 x weekly - 2 sets - 10 reps Sit to Stand - 1 x daily - 7 x weekly - 2 sets - 10 reps Seated Long Arc Quad - 1 x daily - 7 x weekly - 2 sets - 15 reps Standing Hip Abduction with Counter Support - 1 x daily - 7 x weekly - 2 sets - 10 reps Standing Hip Extension with Counter Support - 1 x daily - 7 x weekly - 2 sets - 10 reps Standing Marching - 1 x daily - 7 x weekly - 2 sets - 20 reps Standing Knee Flexion with Counter Support - 1 x daily - 7 x weekly - 2 sets - 10 reps Heel rises with counter support - 1 x daily - 7 x weekly - 2 sets - 10 reps Tandem Stance in Corner - 1 x daily - 7 x weekly - 3 reps - 30 seconds hold

## 2020-11-26 ENCOUNTER — Ambulatory Visit (INDEPENDENT_AMBULATORY_CARE_PROVIDER_SITE_OTHER): Payer: Medicare Other

## 2020-11-26 DIAGNOSIS — I495 Sick sinus syndrome: Secondary | ICD-10-CM

## 2020-11-28 LAB — CUP PACEART REMOTE DEVICE CHECK
Battery Remaining Longevity: 58 mo
Battery Voltage: 2.99 V
Brady Statistic AP VP Percent: 0.51 %
Brady Statistic AP VS Percent: 81.16 %
Brady Statistic AS VP Percent: 1.89 %
Brady Statistic AS VS Percent: 16.44 %
Brady Statistic RA Percent Paced: 73.66 %
Brady Statistic RV Percent Paced: 2.1 %
Date Time Interrogation Session: 20221018160238
Implantable Lead Implant Date: 20180115
Implantable Lead Implant Date: 20180115
Implantable Lead Location: 753859
Implantable Lead Location: 753860
Implantable Lead Model: 5076
Implantable Lead Model: 5076
Implantable Pulse Generator Implant Date: 20180115
Lead Channel Impedance Value: 323 Ohm
Lead Channel Impedance Value: 380 Ohm
Lead Channel Impedance Value: 399 Ohm
Lead Channel Impedance Value: 456 Ohm
Lead Channel Pacing Threshold Amplitude: 0.625 V
Lead Channel Pacing Threshold Amplitude: 0.75 V
Lead Channel Pacing Threshold Pulse Width: 0.4 ms
Lead Channel Pacing Threshold Pulse Width: 0.4 ms
Lead Channel Sensing Intrinsic Amplitude: 1.875 mV
Lead Channel Sensing Intrinsic Amplitude: 1.875 mV
Lead Channel Sensing Intrinsic Amplitude: 10.25 mV
Lead Channel Sensing Intrinsic Amplitude: 10.25 mV
Lead Channel Setting Pacing Amplitude: 2 V
Lead Channel Setting Pacing Amplitude: 2.5 V
Lead Channel Setting Pacing Pulse Width: 0.4 ms
Lead Channel Setting Sensing Sensitivity: 2 mV

## 2020-12-05 NOTE — Progress Notes (Signed)
Remote pacemaker transmission.   

## 2020-12-06 ENCOUNTER — Ambulatory Visit: Payer: Medicare Other | Admitting: Physical Therapy

## 2020-12-12 ENCOUNTER — Other Ambulatory Visit: Payer: Self-pay | Admitting: Physician Assistant

## 2020-12-12 DIAGNOSIS — D649 Anemia, unspecified: Secondary | ICD-10-CM

## 2020-12-12 NOTE — Progress Notes (Signed)
[place] OFFICE PROGRESS NOTE  [name], MD [place] [address]  DIAGNOSIS: Persistent macrocytic anemia likely secondary to vitamin B12 deficiency after small bowel resection followed by an obstruction December 2020.   PRIOR THERAPY: None   CURRENT THERAPY: Abdomen B12 injections monthly.  Last injection 10/23/2020.  INTERVAL HISTORY: Megan Hester 87 y.o. female returns to the clinic today for follow-up visit accompanied by her daughter in law.  The patient receives monthly vitamin B12 injections.  It appears that the last injection was on 10/23/2020.  The patient called yesterday endorsing of worsening generalized weakness and lightheadedness and they felt that her blood work needed to be rechecked sooner than her scheduled appointments.   The patient has been having ongoing issues with atrial fibrillation which is persistent. They stated that her cardiologist is having a hard time getting it "under control". Her metoprolol dose was recently adjusted/increased. Additionally, her dose of Seroquel was increased as well around the time her symptoms started. She has a follow up with her cardiologist next week. She states she has been drinking a lot of flavored water to stay hydrated. Yesterday, she was ambulating and had some generalized weakness in both of her legs and lightheadedness and her legs buckled. After sitting and eating she felt better. She did not lose consciousness and she denies headaches, visual changes, slurred speech, numbness/tingling in her extremities, unilateral extremity weakness, or facial drooping. She is on a blood thinner.   She denies chest pain. She reports dyspnea on exertion if she is climbing stairs.  Denies any abnormal bleeding or bruising including epistaxis, gingival bleeding, hemoptysis, hematemesis, melena, or hematochezia.  Denies any fever, chills, night sweats, or unexplained weight loss.  The patient is here  today for evaluation and repeat blood work.  MEDICAL HISTORY: Past Medical History:  Diagnosis Date   Arthritis    "all over my body" (02/25/2016)   Cancer of right breast ([place]) 1999   DCIS   GERD (gastroesophageal reflux disease)    hx; "when I was working"   Hypertension    Macular degeneration    Paroxysmal atrial fibrillation ([place])    Presence of permanent cardiac pacemaker    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    ALLERGIES:  is allergic to shellfish allergy, atropine, codeine, doxycycline, erythromycin, fenoprofen calcium, iodinated diagnostic agents, isopto hyoscine [scopolamine], levaquin [levofloxacin], naproxen, other, oxycodone, penicillins, sulfa drugs cross reactors, warfarin, ceclor [cefaclor], and tape.  MEDICATIONS:  Current Outpatient Medications  Medication Sig Dispense Refill   cetirizine (ZYRTEC) 10 MG tablet Take 10 mg by mouth at bedtime.     Cholecalciferol (VITAMIN D) 1000 UNITS capsule Take 1,000 Units by mouth at bedtime.      clobetasol cream (TEMOVATE) 4.25 % Apply 1 application topically daily as needed (dermatitis).      Hypromellose (ARTIFICIAL TEARS OP) Place 1 drop into the left eye 2 (two) times daily.      metoprolol tartrate (LOPRESSOR) 100 MG tablet TAKE 1 TABLET BY MOUTH TWICE DAILY. 60 tablet 9   Multiple Vitamins-Minerals (PRESERVISION/LUTEIN PO) Take 1 capsule by mouth 2 (two) times daily.      Polyethyl Glycol-Propyl Glycol (SYSTANE OP) Place 1 drop into the right eye 2 (two) times daily.      polyethylene glycol (MIRALAX / GLYCOLAX) packet Take 17 g by mouth daily as needed for moderate constipation.      QUEtiapine (SEROQUEL) 25 MG tablet Take 25  mg by mouth at bedtime.     sertraline (ZOLOFT) 100 MG tablet TAKE ONE TABLET AT BEDTIME FOR ANXIETY/DEPRESSION.     XARELTO 15 MG TABS tablet TAKE 1 TABLET ONCE DAILY WITH SUPPER. 30 tablet 5   No current facility-administered medications for this visit.    SURGICAL  HISTORY:  Past Surgical History:  Procedure Laterality Date   APPLICATION OF WOUND VAC Bilateral 02/01/2019   Procedure: APPLICATION OF WOUND VAC;  Surgeon: [name], MD;  Location: [facility];  Service: General;  Laterality: Bilateral;   BREAST BIOPSY Right    BREAST LUMPECTOMY Right 1999   RADIATION   CARDIAC CATHETERIZATION  02/05/1999   HYPERDYNAMIC LEFT VENTRICLE WITH EF OF 75-80%   CARDIOVERSION N/A 01/22/2015   Procedure: CARDIOVERSION;  Surgeon: [name], MD;  Location: [facility];  Service: Cardiovascular;  Laterality: N/A;   CARDIOVERSION N/A 02/22/2015   Procedure: CARDIOVERSION;  Surgeon: [name], MD;  Location: [facility];  Service: Cardiovascular;  Laterality: N/A;   DILATION AND CURETTAGE OF UTERUS     ENUCLEATION Right 2012   EP IMPLANTABLE DEVICE N/A 02/25/2016   Procedure: Pacemaker Implant;  Surgeon: [name], MD;  Location: [facility];  Service: Cardiovascular;  Laterality: N/A;   INSERT / REPLACE / REMOVE PACEMAKER     LAPAROSCOPIC SMALL BOWEL RESECTION N/A 02/01/2019   Procedure: LAPAROSCOPIC SMALL BOWEL RESECTION WITH TAP BLOCK;  Surgeon: [name], MD;  [facility];  Service: General;  Laterality: N/A;   LAPAROSCOPY N/A 02/01/2019   Procedure: LAPAROSCOPY DIAGNOSTIC;  Surgeon: [name], MD;  [facility];  Service: General;  Laterality: N/A;   LYSIS OF ADHESION N/A 02/01/2019   Procedure: LYSIS OF ADHESION;  Surgeon: [name], MD;  [facility];  Service: General;  Laterality: N/A;   TONSILLECTOMY      REVIEW OF SYSTEMS:   Review of Systems  Constitutional: Positive for fatigue and generalized weakness. Negative for appetite change, chills, fever and unexpected weight change.  HENT:   Negative for mouth sores, nosebleeds, sore throat and trouble swallowing.   Eyes: Negative for eye problems and icterus.  Respiratory: Positive for dyspnea on exertion. Negative for cough, hemoptysis, and wheezing.    Cardiovascular: Negative for chest pain and leg swelling.  Gastrointestinal: Negative for abdominal pain, constipation, diarrhea, nausea and vomiting.  Genitourinary: Negative for bladder incontinence, difficulty urinating, dysuria, frequency and hematuria.   Musculoskeletal: Negative for back pain, gait problem, neck pain and neck stiffness.  Skin: Negative for itching and rash.  Neurological: Positive for intermittent lightheadedness. Negative for dizziness, extremity weakness, gait problem, headaches, and seizures.  Hematological: Negative for adenopathy. Does not bruise/bleed easily.  Psychiatric/Behavioral: Negative for confusion, depression and sleep disturbance. The patient is not nervous/anxious.     PHYSICAL EXAMINATION:  Blood pressure 95/61, pulse (!) 120, temperature (!) 97.4 F (36.3 C), temperature source Tympanic, resp. rate 17, weight 134 lb 5 oz (60.9 kg), SpO2 98 %.  ECOG PERFORMANCE STATUS: 2-3  Physical Exam  Constitutional: Oriented to person, place, and time and thin appearing female and in no distress.  HENT:  Head: Normocephalic and atraumatic.  Mouth/Throat: Oropharynx is clear and moist. No oropharyngeal exudate.  Eyes: Conjunctivae are normal. Right eye exhibits no discharge. Left eye exhibits no discharge. No scleral icterus.  Neck: Normal range of motion. Neck supple.  Cardiovascular: tachycardic pulse ranging 86-120, irregular rhythm, normal heart sounds and intact distal pulses.   Pulmonary/Chest: Effort normal and breath  sounds normal. No respiratory distress. No wheezes. No rales.  Abdominal: Soft. Bowel sounds are normal. Exhibits no distension and no mass. There is no tenderness.  Musculoskeletal: Normal range of motion. Exhibits no edema.  Lymphadenopathy:    No cervical adenopathy.  Neurological: Alert and oriented to person, place, and time. Exhibits muscle wasting. Examined in the wheelchair.  Skin: Skin is warm and dry. No rash noted. Not  diaphoretic. No erythema. No pallor.  Psychiatric: Mood, memory and judgment normal.  Vitals reviewed.  LABORATORY DATA: Lab Results  Component Value Date   WBC 4.7 12/13/2020   HGB 9.1 (L) 12/13/2020   HCT 26.9 (L) 12/13/2020   MCV 111.2 (H) 12/13/2020   PLT 234 12/13/2020      Chemistry      Component Value Date/Time   NA 140 08/28/2020 1123   NA 140 08/17/2020 1007   K 4.3 08/28/2020 1123   CL 108 08/28/2020 1123   CO2 24 08/28/2020 1123   BUN 21 08/28/2020 1123   BUN 21 08/17/2020 1007   CREATININE 1.02 (H) 08/28/2020 1123      Component Value Date/Time   CALCIUM 8.9 08/28/2020 1123   ALKPHOS 107 08/28/2020 1123   AST 13 (L) 08/28/2020 1123   ALT 7 08/28/2020 1123   BILITOT 0.8 08/28/2020 1123       RADIOGRAPHIC STUDIES:  CUP PACEART REMOTE DEVICE CHECK  Result Date: 11/28/2020 Scheduled remote reviewed. Normal device function.  Known AF, burden 21.2%, histogram binned ~60% >100 bpm Xarelto, Metoprolol 163m bid prescribed Rotue to triage for HR's Next remote 91 days. LR    ASSESSMENT/PLAN:  This is a very pleasant 87year old Caucasian female with persistent macrocytic anemia likely secondary to B12 deficiency but underlying MDS cannot be completely excluded.  The patient is undergoing treatment with vitamin B12 injections.  This was initially given weekly followed by monthly thereon after.  The patient called endorsing of worsening weakness and lightheadedness and is wondering if her anemia is worsened.   Labs were reviewed. Her Hbg is stable at 9.0 today.   Given the stable persistent anemia, I reviewed with Dr. MJulien Nordmann We will proceed with a bone marrow biopsy and aspirate to rule out MDS.   She will proceed with her B12 injection as scheduled.   Her blood pressure is a little low today likely due to her cardiac medications. She will continue to hydrate. Her fatigue, generalized weakness, and lightheadedness is likely multifactorial due to her atrial  fibrillation, anemia, and medication side effects from metoprolol and Seroquel. She was advised to continue to follow with her cardiologist as scheduled next week. If they feel that she needs to be seen sooner, she can always call sooner for questions regarding her medications.   She denies symptoms of CVA today and denies numbness/tinging, unilateral extremity weakness, speech changes, visual changes, or facial drooping. These were reviewed with the patient and her daughter in law in detail who know to go to the ER if worsening symptoms. The patient denies syncope during her fall yesterday. She states her legs gave out due to weakness but she is aware that losing consciousness warrants an ER visit if she ever has syncope.   She will receive B12 injection today as scheduled.  We will see her about 1 week after her bone marrow biopsy and aspirate to review the results.   The patient was advised to call immediately if she has any concerning symptoms in the interval. The patient voices understanding of  current disease status and treatment options and is in agreement with the current care plan. All questions were answered. The patient knows to call the clinic with any problems, questions or concerns. We can certainly see the patient much sooner if necessary   Orders Placed This Encounter  Procedures   CT Biopsy    Standing Status:   Future    Standing Expiration Date:   12/13/2021    Scheduling Instructions:     Call patient, but if unable to reach , please call her son Richardson Landry or daugher in Public librarian    Order Specific Question:   Lab orders requested (DO NOT place separate lab orders, these will be automatically ordered during procedure specimen collection):    Answer:   Surgical Pathology    Order Specific Question:   Reason for Exam (SYMPTOM  OR DIAGNOSIS REQUIRED)    Answer:   Refractory anemia of unclear etiology    Order Specific Question:   Preferred location?    Answer:   [facility]   CT BONE MARROW BIOPSY & ASPIRATION    Standing Status:   Future    Standing Expiration Date:   12/13/2021    Scheduling Instructions:     If unable to reach patient, call son Richardson Landry or daughter in law andrea.    Order Specific Question:   Reason for Exam (SYMPTOM  OR DIAGNOSIS REQUIRED)    Answer:   Refractory anemia unclear etiology    Order Specific Question:   Preferred imaging location?    Answer:   [facility]    Order Specific Question:   Radiology Contrast Protocol - do NOT remove file path    Answer:   [email or web address]   CBC with Differential (Cancer Center Only)    Standing Status:   Future    Standing Expiration Date:   12/13/2021   Sample to Blood Bank    Standing Status:   Future    Standing Expiration Date:   12/13/2021     The total time spent in the appointment was 30-39 minutes.   Chellsea Beckers L Aveya Beal, PA-C 12/13/20

## 2020-12-13 ENCOUNTER — Inpatient Hospital Stay: Payer: Medicare Other | Admitting: Physician Assistant

## 2020-12-13 ENCOUNTER — Inpatient Hospital Stay: Payer: Medicare Other | Attending: Internal Medicine

## 2020-12-13 ENCOUNTER — Encounter: Payer: Self-pay | Admitting: Physician Assistant

## 2020-12-13 ENCOUNTER — Other Ambulatory Visit: Payer: Self-pay

## 2020-12-13 ENCOUNTER — Inpatient Hospital Stay: Payer: Medicare Other

## 2020-12-13 VITALS — BP 95/61 | HR 120 | Temp 97.4°F | Resp 17 | Wt 134.3 lb

## 2020-12-13 DIAGNOSIS — D539 Nutritional anemia, unspecified: Secondary | ICD-10-CM

## 2020-12-13 DIAGNOSIS — E538 Deficiency of other specified B group vitamins: Secondary | ICD-10-CM | POA: Insufficient documentation

## 2020-12-13 DIAGNOSIS — D649 Anemia, unspecified: Secondary | ICD-10-CM

## 2020-12-13 DIAGNOSIS — D464 Refractory anemia, unspecified: Secondary | ICD-10-CM | POA: Diagnosis not present

## 2020-12-13 LAB — CBC WITH DIFFERENTIAL (CANCER CENTER ONLY)
Abs Immature Granulocytes: 0.01 10*3/uL (ref 0.00–0.07)
Basophils Absolute: 0 10*3/uL (ref 0.0–0.1)
Basophils Relative: 0 %
Eosinophils Absolute: 0.1 10*3/uL (ref 0.0–0.5)
Eosinophils Relative: 3 %
HCT: 26.9 % — ABNORMAL LOW (ref 36.0–46.0)
Hemoglobin: 9.1 g/dL — ABNORMAL LOW (ref 12.0–15.0)
Immature Granulocytes: 0 %
Lymphocytes Relative: 35 %
Lymphs Abs: 1.7 10*3/uL (ref 0.7–4.0)
MCH: 37.6 pg — ABNORMAL HIGH (ref 26.0–34.0)
MCHC: 33.8 g/dL (ref 30.0–36.0)
MCV: 111.2 fL — ABNORMAL HIGH (ref 80.0–100.0)
Monocytes Absolute: 0.4 10*3/uL (ref 0.1–1.0)
Monocytes Relative: 7 %
Neutro Abs: 2.6 10*3/uL (ref 1.7–7.7)
Neutrophils Relative %: 55 %
Platelet Count: 234 10*3/uL (ref 150–400)
RBC: 2.42 MIL/uL — ABNORMAL LOW (ref 3.87–5.11)
RDW: 16.7 % — ABNORMAL HIGH (ref 11.5–15.5)
WBC Count: 4.7 10*3/uL (ref 4.0–10.5)
nRBC: 0.4 % — ABNORMAL HIGH (ref 0.0–0.2)

## 2020-12-13 LAB — SAMPLE TO BLOOD BANK

## 2020-12-13 LAB — VITAMIN B12: Vitamin B-12: 235 pg/mL (ref 180–914)

## 2020-12-13 LAB — FOLATE: Folate: 18 ng/mL (ref >5.9)

## 2020-12-13 MED ORDER — CYANOCOBALAMIN 1000 MCG/ML IJ SOLN
1000.0000 ug | Freq: Once | INTRAMUSCULAR | Status: AC
  Administered 2020-12-13: 1000 ug via INTRAMUSCULAR
  Filled 2020-12-13: qty 1

## 2020-12-13 NOTE — Patient Instructions (Signed)

## 2020-12-14 ENCOUNTER — Encounter: Payer: Medicare Other | Admitting: Physical Therapy

## 2020-12-18 ENCOUNTER — Telehealth: Payer: Self-pay | Admitting: Internal Medicine

## 2020-12-18 NOTE — Telephone Encounter (Signed)
Scheduled per  sch msg. Called and spoke with patients daughter. Confirmed appt  

## 2020-12-21 ENCOUNTER — Encounter: Payer: Medicare Other | Admitting: Physical Therapy

## 2020-12-23 NOTE — Progress Notes (Deleted)
Cardiology Office Note:    Date:  12/23/2020   ID:  Megan Hester, DOB [birth year], MRN [id number]  PCP:  [name], MD  Cardiologist:  Donato Heinz, MD  Electrophysiologist:  [name], MD   Referring MD: [name], MD   Chief complaint: Atrial fibrillation  History of Present Illness:    Megan Hester is a 87 y.o. female with a hx of SBO with small bowel resection 02/01/2019, paroxysmal atrial fibrillation, sick sinus syndrome status post PPM, hypertension, type 2 diabetes who presents for follow-up.  She was admitted to [facility] with SBO and underwent small bowel resection 02/01/2019.  Her postoperative course was complicated by AF with RVR.  Given her hypotension, she was initially started on amiodarone drip.  She was transitioned off amiodarone due to elevated LFTs and started on metoprolol and digoxin for rate control.  Plan on discharge on 02/16/2019 was for 2 weeks of digoxin and then restart flecainide.  She reported that when she restarted flecainide 2 weeks post discharge, she developed extensive lower extremity edema and weakness/dizziness.  Her symptoms resolved with stopping flecainide.  Most recent device check 09/07/2019 showed AF burden 12%, rates controlled.  TTE 02/08/2019 showed LVEF 50 to 55%, normal RV function, indeterminate diastolic function, no significant valvular disease.  TTE 09/19/2020 showed EF 50- 02%, grade 3 diastolic dysfunction, normal RV function, mild to moderate TR, 6 mild aortic dilatation measuring 42 mm, RVSP 36.  Since last clinic visit,  she present to the ED on 06/30/2020 after fall.  She did not hit her head.  Head CT was unremarkable.  Today, pt is accompanied with her daughter and helped provide history. Pt is currently in afib. Pt's daughter she is currently taking physical therapy, which is going well and pt is gaining strength, however she has been having dyspnea with minimal exertion.  Pt begins to feel dizzy  while changing positions.Pt is currently taking Xarelto. Of note, she has occasional epistaxis.Patient denies chest pain, abdominal pain, diarrhea, palpations, nausea, headaches, syncope,orthopnea or PND.     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    Past Surgical History:  Procedure Laterality Date   APPLICATION OF WOUND VAC Bilateral 02/01/2019   Procedure: APPLICATION OF WOUND VAC;  Surgeon: [name], MD;  [facility];  Service: General;  Laterality: Bilateral;   BREAST BIOPSY Right    BREAST LUMPECTOMY Right 1999   RADIATION   CARDIAC CATHETERIZATION  02/05/1999   HYPERDYNAMIC LEFT VENTRICLE WITH EF OF 75-80%   CARDIOVERSION N/A 01/22/2015   Procedure: CARDIOVERSION;  Surgeon: [name], MD;  Location: [facility];  Service: Cardiovascular;  Laterality: N/A;   CARDIOVERSION N/A 02/22/2015   Procedure: CARDIOVERSION;  Surgeon: [name], MD;  Location: [facility];  Service: Cardiovascular;  Laterality: N/A;   DILATION AND CURETTAGE OF UTERUS  ENUCLEATION Right 2012   EP IMPLANTABLE DEVICE N/A 02/25/2016   Procedure: Pacemaker Implant;  Surgeon: [name], MD;  Location: [place] [facility];  Service: Cardiovascular;  Laterality: N/A;   INSERT / REPLACE / REMOVE PACEMAKER     LAPAROSCOPIC SMALL BOWEL RESECTION N/A 02/01/2019   Procedure: LAPAROSCOPIC SMALL BOWEL RESECTION WITH TAP BLOCK;  Surgeon: [name], MD;  [facility];  Service: General;  Laterality:  N/A;   LAPAROSCOPY N/A 02/01/2019   Procedure: LAPAROSCOPY DIAGNOSTIC;  Surgeon: [name], MD;  [facility];  Service: General;  Laterality: N/A;   LYSIS OF ADHESION N/A 02/01/2019   Procedure: LYSIS OF ADHESION;  Surgeon: [name], MD;  [facility];  Service: General;  Laterality: N/A;   TONSILLECTOMY      Current Medications: No outpatient medications have been marked as taking for the 12/26/20 encounter (Appointment) with Donato Heinz, MD.     Allergies:   Shellfish allergy, Atropine, Codeine, Doxycycline, Erythromycin, Fenoprofen calcium, Iodinated diagnostic agents, Isopto hyoscine [scopolamine], Levaquin [levofloxacin], Naproxen, Other, Oxycodone, Penicillins, Sulfa drugs cross reactors, Warfarin, Ceclor [cefaclor], and Tape   Social History   Socioeconomic History   Marital status: Married    Spouse name: Not on file   Number of children: 3   Years of education: Not on file   Highest education level: Not on file  Occupational History    Employer: RETIRED  Tobacco Use   Smoking status: Never   Smokeless tobacco: Never  Vaping Use   Vaping Use: Never used  Substance and Sexual Activity   Alcohol use: Yes    Alcohol/week: 0.0 standard drinks    Comment: 02/25/2016 "glass of wine/month"   Drug use: No   Sexual activity: Yes    Birth control/protection: Post-menopausal    Comment: 1st intercourse 51 yo-1 partner  Other Topics Concern   Not on file  Social History Narrative   Lives in [place].  Retired Solicitor.   Social Determinants of Health   Financial Resource Strain: Not on file  Food Insecurity: Not on file  Transportation Needs: Not on file  Physical Activity: Not on file  Stress: Not on file  Social Connections: Not on file     Family History: The patient's family history includes Diabetes in her brother; Heart attack in her father; Heart disease in her brother; Hypertension in her father; Lung cancer in her  brother; Parkinsonism in her father; Stroke in her mother.  ROS:   Please see the history of present illness.    (+) LE Edema (+) SOB (+) epistaxis (+)lightheadedness All other systems reviewed and are negative.  EKGs/Labs/Other Studies Reviewed:    The following studies were reviewed today:  02/08/2019 Echo: 1. Left ventricular ejection fraction, by visual estimation, is 50 to  55%. The left ventricle has low normal function. Left ventricular septal  wall thickness was normal. Normal left ventricular posterior wall  thickness. There is no left ventricular  hypertrophy.   2. Left ventricular diastolic parameters are indeterminate.   3. The left ventricle has no regional wall motion abnormalities.   4. Global right ventricle has normal systolic function.The right  ventricular size is normal. No increase in right ventricular wall  thickness.   5. Left atrial size was normal.   6. Right atrial size was normal.   7. The mitral valve is normal in structure. Trivial mitral valve  regurgitation. No evidence of mitral stenosis.   8. The tricuspid valve is normal  in structure.   9. The aortic valve is tricuspid. Aortic valve regurgitation is not  visualized. No evidence of aortic valve sclerosis or stenosis.  10. The pulmonic valve was normal in structure. Pulmonic valve  regurgitation is not visualized.  11. Mildly elevated pulmonary artery systolic pressure.  12. A pacer wire is visualized.  13. The inferior vena cava is normal in size with greater than 50%  respiratory variability, suggesting right atrial pressure of 3 mmHg.   EKG:  08/17/2020: atrial fib, rate 97 incomplete RBBB 06/21/2019: atrial fibrillation, occasional ventricular paced complexes, rate 91  Recent Labs: 08/17/2020: TSH 1.840 08/28/2020: ALT 7; BUN 21; Creatinine 1.02; Potassium 4.3; Sodium 140 12/13/2020: Hemoglobin 9.1; Platelet Count 234  Recent Lipid Panel    Component Value Date/Time   TRIG 95  02/14/2019 0326    Physical Exam:    VS:  There were no vitals taken for this visit.    Wt Readings from Last 3 Encounters:  12/13/20 134 lb 5 oz (60.9 kg)  09/11/20 134 lb 12.8 oz (61.1 kg)  08/28/20 135 lb 1.6 oz (61.3 kg)     GEN:  in no acute distress HEENT: Normal NECK: No JVD CARDIAC: Irregular, normal rate, no murmurs RESPIRATORY:  Clear to auscultation without rales, wheezing or rhonchi  ABDOMEN: Soft, non-tender MUSCULOSKELETAL:  No edema SKIN: Warm and dry NEUROLOGIC:  Alert and oriented x 3 PSYCHIATRIC:  Normal affect   ASSESSMENT:    No diagnosis found.   PLAN:    Dyspnea: Reports dyspnea with minimal exertion.  Appears euvolemic on exam.  TTE 09/19/2020 showed EF 50- 39%, grade 3 diastolic dysfunction, normal RV function, mild to moderate TR, 6 mild aortic dilatation measuring 42 mm, RVSP 36.  Anemia likely contributing, follows with hematology, planning bone marrow biopsy  Atrial fibrillation: Had episodes of RVR after her small bowel resection in December.  Follows with EP, currently pursuing rate control strategy.  Echo 02/08/2019 showed EF 50-55% -Continue metoprolol 100 mg twice daily -Continue Xarelto  Hypertension: Appears controlled. Continue metoprolol   SSS: s/p PPM: Follows with EP   RTC in***       Medication Adjustments/Labs and Tests Ordered: Current medicines are reviewed at length with the patient today.  Concerns regarding medicines are outlined above.  No orders of the defined types were placed in this encounter.   No orders of the defined types were placed in this encounter.   There are no Patient Instructions on file for this visit.    I,Jada Bradford,acting as a Education administrator for Donato Heinz, MD.,have documented all relevant documentation on the behalf of Donato Heinz, MD,as directed by  Donato Heinz, MD while in the presence of Donato Heinz, MD.  I, Donato Heinz, MD, have reviewed  all documentation for this visit. The documentation on 12/23/20 for the exam, diagnosis, procedures, and orders are all accurate and complete.   Signed, Donato Heinz, MD  12/23/2020 10:28 PM    [place]

## 2020-12-24 ENCOUNTER — Ambulatory Visit: Payer: Medicare Other | Admitting: Internal Medicine

## 2020-12-24 ENCOUNTER — Other Ambulatory Visit: Payer: Medicare Other

## 2020-12-24 ENCOUNTER — Ambulatory Visit: Payer: Medicare Other

## 2020-12-26 ENCOUNTER — Ambulatory Visit: Payer: Medicare Other | Admitting: Cardiology

## 2020-12-28 ENCOUNTER — Encounter: Payer: Self-pay | Admitting: Physician Assistant

## 2020-12-28 ENCOUNTER — Other Ambulatory Visit: Payer: Self-pay

## 2020-12-28 ENCOUNTER — Ambulatory Visit: Payer: Medicare Other | Admitting: Physician Assistant

## 2020-12-28 VITALS — BP 98/60 | HR 115 | Resp 18 | Ht 64.0 in | Wt 138.0 lb

## 2020-12-28 DIAGNOSIS — Z950 Presence of cardiac pacemaker: Secondary | ICD-10-CM

## 2020-12-28 DIAGNOSIS — I495 Sick sinus syndrome: Secondary | ICD-10-CM

## 2020-12-28 DIAGNOSIS — I4819 Other persistent atrial fibrillation: Secondary | ICD-10-CM | POA: Diagnosis not present

## 2020-12-28 DIAGNOSIS — R42 Dizziness and giddiness: Secondary | ICD-10-CM

## 2020-12-28 LAB — CUP PACEART INCLINIC DEVICE CHECK
Battery Remaining Longevity: 47 mo
Battery Voltage: 2.98 V
Brady Statistic AP VP Percent: 1.6 %
Brady Statistic AP VS Percent: 56.78 %
Brady Statistic AS VP Percent: 4.18 %
Brady Statistic AS VS Percent: 37.43 %
Brady Statistic RA Percent Paced: 45.92 %
Brady Statistic RV Percent Paced: 4.45 %
Date Time Interrogation Session: 20221118190830
Implantable Lead Implant Date: 20180115
Implantable Lead Implant Date: 20180115
Implantable Lead Location: 753859
Implantable Lead Location: 753860
Implantable Lead Model: 5076
Implantable Lead Model: 5076
Implantable Pulse Generator Implant Date: 20180115
Lead Channel Impedance Value: 323 Ohm
Lead Channel Impedance Value: 399 Ohm
Lead Channel Impedance Value: 399 Ohm
Lead Channel Impedance Value: 475 Ohm
Lead Channel Pacing Threshold Amplitude: 0.625 V
Lead Channel Pacing Threshold Amplitude: 0.75 V
Lead Channel Pacing Threshold Pulse Width: 0.4 ms
Lead Channel Pacing Threshold Pulse Width: 0.4 ms
Lead Channel Sensing Intrinsic Amplitude: 0.75 mV
Lead Channel Sensing Intrinsic Amplitude: 0.875 mV
Lead Channel Sensing Intrinsic Amplitude: 11.5 mV
Lead Channel Sensing Intrinsic Amplitude: 11.625 mV
Lead Channel Setting Pacing Amplitude: 2 V
Lead Channel Setting Pacing Amplitude: 2.5 V
Lead Channel Setting Pacing Pulse Width: 0.4 ms
Lead Channel Setting Sensing Sensitivity: 2 mV

## 2020-12-28 MED ORDER — AMIODARONE HCL 200 MG PO TABS: 200.0000 mg | ORAL_TABLET | Freq: Two times a day (BID) | ORAL | 1 refills | Status: DC

## 2020-12-28 MED ORDER — METOPROLOL TARTRATE 100 MG PO TABS: ORAL_TABLET | ORAL | 1 refills | Status: DC

## 2020-12-28 NOTE — Progress Notes (Signed)
Cardiology Office Note Date:  12/28/2020  Patient ID:  Megan Hester, Megan Hester 02/19/1930, MRN [id number] PCP:  [name], MD  Cardiologist:  Dr. Gardiner Rhyme Electrophysiologist: Dr. Rayann Heman    Chief Complaint:  over due device visit  History of Present Illness: Megan Hester is a 87 y.o. female with history of arthritis, SBO w/resection Dec 2020, DM, HTN , AFib, SSSx w/PPM  She comes in today to be seen for Dr. Rayann Heman, last seen by him Nov 2021, at that time discussed her husband had died, daughter was caring for her, she was pretty sedentary, no cardiac complaints, no changes were made.  More recently she  Dr. Gardiner Rhyme in July 2022, she had a fall May 2022, was working with PT though c/o DOE with minimal exertion, and dizziness with position change Planned for CXR, labs, and echo Was euvolemic by exam   TODAY She is accompanied by her daughter in law who is very involved with her/her care. Since the summer and even earlier She has struggled quite a bite with fatigue, weakness, DOE She has no resting SOB No seated or supine dizziness No syncope She does have significant orthostatic dizziness though She continues to work with PT but all exercises need to be seated. She is following closely with heme/onc for anemia, suspect to be B12 deficiency (post bowel resection) and initially had some improvement in her symptoms with supplementations though again slid back again  Work up into her anemia continues and she is planned for a bone marrow biopsy in a few weeks  No CP, no sense of palpitations She had some dental extractions in September that she was off her xarelto for a couple days, but since then she has had no missed doses of he xarelto They do not know if she will need to hold the xarelto for the biopsy  BP are checked at home routinely 90's, rarely get as high as 100 Her daughter in law says she has some days that are better, yesterday they went to book club and  did a couple errands had an OK day, toay is a worse day Some days/often she requires assistance to ambulate because he feels so weak   No bleeding or signs of bleeding She has not been asked/instructed to stop her xarelto through her anemia work up so far.    Device information MDT dual chamber PPM implanted 02/25/2016  AAD Hx Amiodarone used briefly post op (bowel resection) for RVR stopped quickly for abn LFTs (?), though in review I don't find that  Flecainide started 2016 stopped briefly post bowel resection Dec 2020 and then resumed eventually  though 2/2 weakness/dizziness, edema the pt attributed to the start of drug Jan 2021   Past Medical History:  Diagnosis Date   Arthritis    "all over my body" (02/25/2016)   Cancer of right breast ([place]) 1999   DCIS   GERD (gastroesophageal reflux disease)    hx; "when I was working"   Hypertension    Macular degeneration    Paroxysmal atrial fibrillation ([place])    Presence of permanent cardiac pacemaker    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    Past Surgical History:  Procedure Laterality Date   APPLICATION OF WOUND VAC Bilateral 02/01/2019   Procedure: APPLICATION OF WOUND VAC;  Surgeon: [name], MD;  Location: [facility];  Service: General;  Laterality: Bilateral;   BREAST BIOPSY Right    BREAST LUMPECTOMY Right 1999  RADIATION   CARDIAC CATHETERIZATION  02/05/1999   HYPERDYNAMIC LEFT VENTRICLE WITH EF OF 75-80%   CARDIOVERSION N/A 01/22/2015   Procedure: CARDIOVERSION;  Surgeon: [name], MD;  Location: [facility];  Service: Cardiovascular;  Laterality: N/A;   CARDIOVERSION N/A 02/22/2015   Procedure: CARDIOVERSION;  Surgeon: [name], MD;  Location: [facility];  Service: Cardiovascular;  Laterality: N/A;   DILATION AND CURETTAGE OF UTERUS     ENUCLEATION Right 2012   EP IMPLANTABLE DEVICE N/A 02/25/2016   Procedure: Pacemaker Implant;  Surgeon: [name], MD;  Location: [facility];  Service: Cardiovascular;  Laterality: N/A;   INSERT / REPLACE / REMOVE PACEMAKER     LAPAROSCOPIC SMALL BOWEL RESECTION N/A 02/01/2019   Procedure: LAPAROSCOPIC SMALL BOWEL RESECTION WITH TAP BLOCK;  Surgeon: [name], MD;  Location: [facility];  Service: General;  Laterality: N/A;   LAPAROSCOPY N/A 02/01/2019   Procedure: LAPAROSCOPY DIAGNOSTIC;  Surgeon: [name], MD;  Location: [facility];  Service: General;  Laterality: N/A;   LYSIS OF ADHESION N/A 02/01/2019   Procedure: LYSIS OF ADHESION;  Surgeon: [name], MD;  Location: [facility];  Service: General;  Laterality: N/A;   TONSILLECTOMY      Current Outpatient Medications  Medication Sig Dispense Refill   cetirizine (ZYRTEC) 10 MG tablet Take 10 mg by mouth at bedtime.     Cholecalciferol (VITAMIN D) 1000 UNITS capsule Take 1,000 Units by mouth at bedtime.      clobetasol cream (TEMOVATE) 5.88 % Apply 1 application topically daily as needed (dermatitis).      Hypromellose (ARTIFICIAL TEARS OP) Place 1 drop into the left eye 2 (two) times daily.      metoprolol tartrate (LOPRESSOR) 100 MG tablet TAKE 1 TABLET BY MOUTH TWICE DAILY. 60 tablet 9   Multiple Vitamins-Minerals (PRESERVISION/LUTEIN PO) Take 1 capsule by mouth 2 (two) times daily.      Polyethyl Glycol-Propyl Glycol (SYSTANE OP) Place 1 drop into the right eye 2 (two) times daily.      polyethylene glycol (MIRALAX / GLYCOLAX) packet Take 17 g by mouth daily as needed for moderate constipation.      QUEtiapine (SEROQUEL) 25 MG tablet Take 25 mg by mouth at bedtime.     sertraline (ZOLOFT) 100 MG tablet TAKE ONE TABLET AT BEDTIME FOR ANXIETY/DEPRESSION.     XARELTO 15 MG TABS tablet TAKE 1 TABLET ONCE DAILY WITH SUPPER. 30 tablet 5   No current facility-administered medications for this visit.    Allergies:   Shellfish allergy, Atropine, Codeine, Doxycycline, Erythromycin, Fenoprofen calcium, Iodinated diagnostic agents, Isopto hyoscine [scopolamine],  Levaquin [levofloxacin], Naproxen, Other, Oxycodone, Penicillins, Sulfa drugs cross reactors, Warfarin, Ceclor [cefaclor], and Tape   Social History:  The patient  reports that she has never smoked. She has never used smokeless tobacco. She reports current alcohol use. She reports that she does not use drugs.   Family History:  The patient's family history includes Diabetes in her brother; Heart attack in her father; Heart disease in her brother; Hypertension in her father; Lung cancer in her brother; Parkinsonism in her father; Stroke in her mother.  ROS:  Please see the history of present illness.    All other systems are reviewed and otherwise negative.   PHYSICAL EXAM:  VS:  There were no vitals taken for this visit. BMI: There is no height or weight on file to calculate BMI. Well nourished, well developed, in no acute distress HEENT: normocephalic, atraumatic  Neck: no JVD, carotid bruits or masses Cardiac:  irreg-irreg; no significant murmurs, no rubs, or gallops Lungs:  CTA b/l, no wheezing, rhonchi or rales Abd: soft, nontender MS: no deformity, age appropriate/perhaps advanced atrophy Ext:  no edema Skin: warm and dry, no rash Neuro:  No gross deficits appreciated Psych: euthymic mood, full affect  PPM site is stable, no tethering or discomfort   EKG:  done today and reviewed by myself AFlutter (atypical) 115bpm, RBBB  Device interrogation done today and reviewed by myself:  Battery and lead measurements are good She is in Aflutter today, she has sime intermittent V pacing while I am checking her device, rates generally 100-110s AF burden 49.2 (in 12 mo), much of this burden earlier in the year No HVR/VT episodes Histograms are reviewed HRs up/down She has clear times of SR, likely in Sept had a good period of SR by data Rates in AF are variable noting rates 60's-10's   09/19/2020: TTE 1. Left ventricular ejection fraction, by estimation, is 50 to 55%. The  left  ventricle has low normal function. The left ventricle abnormal septal  motion. Left ventricular diastolic parameters are consistent with Grade  III diastolic dysfunction  (restrictive).   2. Tricuspid valve regurgitation is mild to moderate.   3. The aortic valve is tricuspid. There is mild calcification of the  aortic valve. There is mild thickening of the aortic valve. Aortic valve  regurgitation is trivial. Mild aortic valve sclerosis is present, with no  evidence of aortic valve stenosis.   4. Aortic dilatation noted. There is mild dilatation of the ascending  aorta, measuring 42 mm.   5. The mitral valve is grossly normal. Trivial mitral valve  regurgitation. No evidence of mitral stenosis.   6. Left atrial size was mildly dilated.   7. Right atrial size was moderately dilated.   8. Right ventricular systolic function is normal. The right ventricular  size is mildly enlarged. There is mildly elevated pulmonary artery  systolic pressure. The estimated right ventricular systolic pressure is  09.9 mmHg.   9. The inferior vena cava is normal in size with greater than 50%  respiratory variability, suggesting right atrial pressure of 3 mmHg.   Comparison(s): A prior study was performed on 02/08/2019. Prior images  reviewed side by side. Increase in left and right atrial size.     01/08/2015: stress myoview Nuclear stress EF: 69%. There was no ST segment deviation noted during stress. No T wave inversion was noted during stress. This is a low risk study.   Low risk stress nuclear study with a small/mild perfusion abnormality consistent with "RV attachment artifact" and no reversible ischemia. Normal left ventricular regional and global systolic function.   Recent Labs: 08/17/2020: TSH 1.840 08/28/2020: ALT 7; BUN 21; Creatinine 1.02; Potassium 4.3; Sodium 140 12/13/2020: Hemoglobin 9.1; Platelet Count 234  No results found for requested labs within last 8760 hours.   CrCl cannot  be calculated (Patient's most recent lab result is older than the maximum 21 days allowed.).   Wt Readings from Last 3 Encounters:  12/13/20 134 lb 5 oz (60.9 kg)  09/11/20 134 lb 12.8 oz (61.1 kg)  08/28/20 135 lb 1.6 oz (61.3 kg)     Other studies reviewed: Additional studies/records reviewed today include: summarized above  ASSESSMENT AND PLAN:  PPM Intact function  Persistent AFib CHA2DS2Vasc is 4, on xarelto, appropriately dosed AFib with variable rate control, by historgrams Burden is probably underestimated with some under sensed  Afib   She has not had any missed doses of Xarelto I think getting her back into SR will be helpful She has significant orthostatic symptoms and BP low side Start amiodarone 232m BID They will try to find out f she will need to be off her xarelto for the biopsy If no interruption will be needed then I think we should proceed with a DCCV  Her HR is not stuck high, her hypotension is very problematic Will reduce her lopressor slightly to 516mAM, keeping 10028mM   HTN Now with hypotension    Disposition: F/u with Dr. SchGardiner Rhymext week as scheduled, I will send him my note as well.  Current medicines are reviewed at length with the patient today.  The patient did not have any concerns regarding medicines.  SigVenetia NightA-C 12/28/2020 4:26 AM     [facility] [address] [place] [phone])  [phone])

## 2020-12-28 NOTE — Patient Instructions (Signed)
Medication Instructions:    START TAKING AMIODARONE 200 TWICE A DAY   START TAKING  METOPROLOL: (LOPRESSOR) 50 MG IN AM AND 100 MG IN PM   *If you need a refill on your cardiac medications before your next appointment, please call your pharmacy*   Lab Work: NONE ORDERED  TODAY    If you have labs (blood work) drawn today and your tests are completely normal, you will receive your results only by: [place] (if you have MyChart) OR A paper copy in the mail If you have any lab test that is abnormal or we need to change your treatment, we will call you to review the results.   Testing/Procedures: NONE ORDERED  TODAY     Follow-Up: At [facility], you and your health needs are our priority.  As part of our continuing mission to provide you with exceptional heart care, we have created designated Provider Care Teams.  These Care Teams include your primary Cardiologist (physician) and Advanced Practice Providers (APPs -  Physician Assistants and Nurse Practitioners) who all work together to provide you with the care you need, when you need it.  We recommend signing up for the patient portal called "MyChart".  Sign up information is provided on this After Visit Summary.  MyChart is used to connect with patients for Virtual Visits (Telemedicine).  Patients are able to view lab/test results, encounter notes, upcoming appointments, etc.  Non-urgent messages can be sent to your provider as well.   To learn more about what you can do with MyChart, go to [email or web address].    Your next appointment:  AS SCHEDULED    Other Instructions

## 2020-12-30 NOTE — Progress Notes (Signed)
Cardiology Office Note:    Date:  01/01/2021   ID:  Megan Hester Megan Hester, DOB Aug 26, 1930, MRN [id number]  PCP:  [name], MD  Cardiologist:  Donato Heinz, MD  Electrophysiologist:  [name], MD   Referring MD: [name], MD   Chief complaint: Atrial fibrillation  History of Present Illness:    Megan Hester is a 87 y.o. female with a hx of SBO with small bowel resection 02/01/2019, paroxysmal atrial fibrillation, sick sinus syndrome status post PPM, hypertension, type 2 diabetes who presents for follow-up.  She was admitted to [facility] with SBO and underwent small bowel resection 02/01/2019.  Her postoperative course was complicated by AF with RVR.  Given her hypotension, she was initially started on amiodarone drip.  She was transitioned off amiodarone due to elevated LFTs and started on metoprolol and digoxin for rate control.  Plan on discharge on 02/16/2019 was for 2 weeks of digoxin and then restart flecainide.  She reported that when she restarted flecainide 2 weeks post discharge, she developed extensive lower extremity edema and weakness/dizziness.  Her symptoms resolved with stopping flecainide.  Most recent device check 09/07/2019 showed AF burden 12%, rates controlled.  TTE 02/08/2019 showed LVEF 50 to 55%, normal RV function, indeterminate diastolic function, no significant valvular disease.  TTE 09/19/2020 showed EF 50- 31%, grade 3 diastolic dysfunction, normal RV function, mild to moderate TR, 6 mild aortic dilatation measuring 42 mm, RVSP 36.  Since last clinic visit, she reports that she has been feeling very fatigued.  Has been having dyspnea with exertion.  Denies any chest pain.  Reports having lightheadedness and near syncope.  Occasional lower extremity edema.  No missed doses of Xarelto.    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    Past Surgical History:  Procedure Laterality Date   APPLICATION OF WOUND VAC Bilateral 02/01/2019   Procedure: APPLICATION OF WOUND VAC;  Surgeon: [name], MD;  Location: [facility];  Service: General;  Laterality: Bilateral;   BREAST BIOPSY Right    BREAST LUMPECTOMY Right 1999   RADIATION   CARDIAC CATHETERIZATION  02/05/1999   HYPERDYNAMIC LEFT VENTRICLE WITH EF OF 75-80%   CARDIOVERSION N/A 01/22/2015   Procedure: CARDIOVERSION;  Surgeon: [name], MD;  Location: [place];  Service: Cardiovascular;  Laterality: N/A;   CARDIOVERSION N/A 02/22/2015   Procedure: CARDIOVERSION;  Surgeon: [name], MD;  Location: [place];  Service: Cardiovascular;  Laterality: N/A;   DILATION AND CURETTAGE OF UTERUS     ENUCLEATION Right 2012   EP IMPLANTABLE DEVICE N/A 02/25/2016   Procedure: Pacemaker Implant;  Surgeon: [name], MD;  Location: [facility] [facility];  Service: Cardiovascular;  Laterality: N/A;   INSERT / REPLACE / REMOVE PACEMAKER     LAPAROSCOPIC SMALL BOWEL RESECTION N/A 02/01/2019   Procedure: LAPAROSCOPIC SMALL BOWEL RESECTION WITH TAP  BLOCK;  Surgeon: [name], MD;  Location: [facility];  Service: General;  Laterality: N/A;   LAPAROSCOPY N/A 02/01/2019   Procedure: LAPAROSCOPY DIAGNOSTIC;  Surgeon: [name], MD;  Location: [facility];  Service: General;  Laterality: N/A;   LYSIS OF ADHESION N/A 02/01/2019   Procedure: LYSIS OF ADHESION;  Surgeon: [name], MD;  Location: [facility];  Service: General;  Laterality: N/A;   TONSILLECTOMY      Current Medications: Current  Meds  Medication Sig   [START ON 01/12/2021] amiodarone (PACERONE) 200 MG tablet Take 200 mg by mouth daily.   amiodarone (PACERONE) 200 MG tablet Take 200 mg by mouth 2 (two) times daily.     Allergies:   Shellfish allergy, Atropine, Codeine, Doxycycline, Erythromycin, Fenoprofen calcium, Iodinated diagnostic agents, Isopto hyoscine [scopolamine], Levaquin [levofloxacin], Naproxen, Other, Oxycodone, Penicillins, Sulfa drugs cross reactors, Warfarin, Ceclor [cefaclor], and Tape   Social History   Socioeconomic History   Marital status: Married    Spouse name: Not on file   Number of children: 3   Years of education: Not on file   Highest education level: Not on file  Occupational History    Employer: RETIRED  Tobacco Use   Smoking status: Never   Smokeless tobacco: Never  Vaping Use   Vaping Use: Never used  Substance and Sexual Activity   Alcohol use: Yes    Alcohol/week: 0.0 standard drinks    Comment: 02/25/2016 "glass of wine/month"   Drug use: No   Sexual activity: Yes    Birth control/protection: Post-menopausal    Comment: 1st intercourse 25 yo-1 partner  Other Topics Concern   Not on file  Social History Narrative   Lives in [place].  Retired Solicitor.   Social Determinants of Health   Financial Resource Strain: Not on file  Food Insecurity: Not on file  Transportation Needs: Not on file  Physical Activity: Not on file  Stress: Not on file  Social Connections: Not on file     Family History: The patient's family history includes Diabetes in her brother; Heart attack in her father; Heart disease in her brother; Hypertension in her father; Lung cancer in her brother; Parkinsonism in her father; Stroke in her mother.  ROS:   Please see the history of present illness.    All other systems reviewed and are negative.  EKGs/Labs/Other Studies Reviewed:    The following studies were reviewed today:  02/08/2019 Echo: 1. Left ventricular ejection  fraction, by visual estimation, is 50 to  55%. The left ventricle has low normal function. Left ventricular septal  wall thickness was normal. Normal left ventricular posterior wall  thickness. There is no left ventricular  hypertrophy.   2. Left ventricular diastolic parameters are indeterminate.   3. The left ventricle has no regional wall motion abnormalities.   4. Global right ventricle has normal systolic function.The right  ventricular size is normal. No increase in right ventricular wall  thickness.   5. Left atrial size was normal.   6. Right atrial size was normal.   7. The mitral valve is normal in structure. Trivial mitral valve  regurgitation. No evidence of mitral stenosis.   8. The tricuspid valve is normal in structure.   9. The aortic valve is tricuspid. Aortic valve regurgitation is not  visualized. No evidence of aortic valve sclerosis or stenosis.  10. The pulmonic valve was normal in structure. Pulmonic valve  regurgitation is not visualized.  11. Mildly elevated pulmonary artery systolic  pressure.  12. A pacer wire is visualized.  13. The inferior vena cava is normal in size with greater than 50%  respiratory variability, suggesting right atrial pressure of 3 mmHg.   EKG:  01/01/2021: Atrial fibrillation, rate 61, incomplete right bundle branch block 08/17/2020: atrial fib, rate 97 incomplete RBBB 06/21/2019: atrial fibrillation, occasional ventricular paced complexes, rate 91  Recent Labs: 08/17/2020: TSH 1.840 08/28/2020: ALT 7; BUN 21; Creatinine 1.02; Potassium 4.3; Sodium 140 12/13/2020: Hemoglobin 9.1; Platelet Count 234  Recent Lipid Panel    Component Value Date/Time   TRIG 95 02/14/2019 0326    Physical Exam:    VS:  BP 99/65   Pulse 61   Ht _0  (1.626 m)   Wt 138 lb 3.2 oz (62.7 kg)   SpO2 99%   BMI 23.72 kg/m     Wt Readings from Last 3 Encounters:  01/01/21 138 lb 3.2 oz (62.7 kg)  12/28/20 138 lb (62.6 kg)  12/13/20 134 lb 5 oz (60.9  kg)     GEN:  in no acute distress HEENT: Normal NECK: No JVD CARDIAC: Irregular, normal rate, no murmurs RESPIRATORY:  Clear to auscultation without rales, wheezing or rhonchi  ABDOMEN: Soft, non-tender MUSCULOSKELETAL:  No edema SKIN: Warm and dry NEUROLOGIC:  Alert and oriented x 3 PSYCHIATRIC:  Normal affect   ASSESSMENT:    1. Persistent atrial fibrillation ([place])   2. Pre-procedure lab exam   3. DOE (dyspnea on exertion)   4. SSS (sick sinus syndrome) ([facility])      PLAN:    Atrial fibrillation: Follows with EP, recently started on amiodarone 200 mg twice daily with plans for cardioversion.  Echo 02/08/2019 showed EF 50-55% -Continue metoprolol, currently on 50 mg every morning and 100 mg nightly.  BP soft, will reduce to 50 mg twice daily -Continue Xarelto -Device interrogation shows she has had extended periods out of Afib but currently in persistent Afib and reporting worsening fatigue/dyspnea.  Rates controlled.  Recommend trying to restore sinus rhythm.  Continue amiodarone, will load with 200 mg BID x 14 days then reduce to 200 mg daily.  Cardioversion scheduled for 12/6  Dyspnea: Reports dyspnea with minimal exertion.  Appears euvolemic on exam.  TTE 09/19/2020 showed EF 50- 30%, grade 3 diastolic dysfunction, normal RV function, mild to moderate TR, 6 mild aortic dilatation measuring 42 mm, RVSP 36.  Anemia likely contributing, follows with hematology, planning bone marrow biopsy.  A. fib may be contributing as above, planning cardioversion    SSS: s/p PPM: Follows with EP   RTC in 2 months       Medication Adjustments/Labs and Tests Ordered: Current medicines are reviewed at length with the patient today.  Concerns regarding medicines are outlined above.  Orders Placed This Encounter  Procedures   CBC with Differential/Platelet   Basic metabolic panel   EKG [id number]     Meds ordered this encounter  Medications   metoprolol tartrate (LOPRESSOR) 50 MG tablet     Sig: Take 1 tablet (50 mg total) by mouth 2 (two) times daily.    Dispense:  180 tablet    Refill:  1    NEW DOSE, D/C 100 MG RX     Patient Instructions  Medication Instructions:  DECREASE YOUR METOPROLOL TO 50 MG TWICE A DAY   CONTINUE YOUR AMIODARONE 200 MG TWICE A DAY THROUGH 12/2 THEN REDUCE TO 200 MG ONCE A DAY   *If you need a refill on your cardiac  medications before your next appointment, please call your pharmacy*  Lab Work: BMET/CBC   If you have labs (blood work) drawn today and your tests are completely normal, you will receive your results only by: [place] (if you have MyChart) OR A paper copy in the mail If you have any lab test that is abnormal or we need to change your treatment, we will call you to review the results.  Testing/Procedures: CARDIOVERSION ON 12/06 SEE BELOW   Follow-Up: At [facility], you and your health needs are our priority.  As part of our continuing mission to provide you with exceptional heart care, we have created designated Provider Care Teams.  These Care Teams include your primary Cardiologist (physician) and Advanced Practice Providers (APPs -  Physician Assistants and Nurse Practitioners) who all work together to provide you with the care you need, when you need it.  We recommend signing up for the patient portal called "MyChart".  Sign up information is provided on this After Visit Summary.  MyChart is used to connect with patients for Virtual Visits (Telemedicine).  Patients are able to view lab/test results, encounter notes, upcoming appointments, etc.  Non-urgent messages can be sent to your provider as well.   To learn more about what you can do with MyChart, go to [email or web address].    Your next appointment:   02/23/2020 AT 10:20 AM WITH DR [facility]   Other Instructions  You are scheduled for a  Cardioversion on 01/15/2022 with Dr. Gardiner Rhyme.  Please arrive at the [facility] (Main Entrance A) at [facility]: [address] [place], [place] [address] at 9:30 am (1 hour prior to procedure unless lab work is needed; if lab work is needed arrive 1.5 hours ahead)  DIET: Nothing to eat or drink after midnight except a sip of water with medications (see medication instructions below)  FYI: For your safety, and to allow [place] to monitor your vital signs accurately during the surgery/procedure we request that   if you have artificial nails, gel coating, SNS etc. Please have those removed prior to your surgery/procedure. Not having the nail coverings /polish removed may result in cancellation or delay of your surgery/procedure.   Medication Instructions:  Continue your anticoagulant: XARELTO  You will need to continue your anticoagulant after your procedure until you are told by your  Provider that it is safe to stop  Labs:  Come to: [place] must have a responsible person to drive you home and stay in the waiting area during your procedure. Failure to do so could result in cancellation.  Bring your insurance cards.  *Special Note: Every effort is made to have your procedure done on time. Occasionally there are emergencies that occur at the hospital that may cause delays. Please be patient if a delay does occur.      I,Jada Bradford,acting as a Education administrator for Donato Heinz, MD.,have documented all relevant documentation on the behalf of Donato Heinz, MD,as directed by  Donato Heinz, MD while in the presence of Donato Heinz, MD.  I, Donato Heinz, MD, have reviewed all documentation for this visit. The documentation on 01/01/21 for the exam, diagnosis, procedures, and orders are all accurate and complete.   Signed, Donato Heinz, MD  01/01/2021 5:06 PM    [place]

## 2020-12-31 ENCOUNTER — Telehealth: Payer: Self-pay | Admitting: Physician Assistant

## 2020-12-31 ENCOUNTER — Telehealth: Payer: Self-pay | Admitting: Medical Oncology

## 2020-12-31 NOTE — Telephone Encounter (Signed)
I told daughter that Megan Hester dies not need to hold her xarelto.

## 2020-12-31 NOTE — Telephone Encounter (Signed)
Follow Up:       Daughter in-law said she was supposed to call back and talk to [facility] after  she talked to the patient's Oncologist. Joseph Art wanted patient to have Cardioversion asap.

## 2021-01-01 ENCOUNTER — Other Ambulatory Visit: Payer: Self-pay

## 2021-01-01 ENCOUNTER — Ambulatory Visit: Payer: Medicare Other | Admitting: Cardiology

## 2021-01-01 ENCOUNTER — Encounter: Payer: Self-pay | Admitting: Cardiology

## 2021-01-01 VITALS — BP 99/65 | HR 61 | Ht 64.0 in | Wt 138.2 lb

## 2021-01-01 DIAGNOSIS — I4819 Other persistent atrial fibrillation: Secondary | ICD-10-CM | POA: Diagnosis not present

## 2021-01-01 DIAGNOSIS — Z01818 Encounter for other preprocedural examination: Secondary | ICD-10-CM

## 2021-01-01 DIAGNOSIS — I495 Sick sinus syndrome: Secondary | ICD-10-CM

## 2021-01-01 DIAGNOSIS — Z01812 Encounter for preprocedural laboratory examination: Secondary | ICD-10-CM

## 2021-01-01 DIAGNOSIS — R0609 Other forms of dyspnea: Secondary | ICD-10-CM | POA: Diagnosis not present

## 2021-01-01 MED ORDER — METOPROLOL TARTRATE 50 MG PO TABS: 50.0000 mg | ORAL_TABLET | Freq: Two times a day (BID) | ORAL | 1 refills | Status: DC

## 2021-01-01 NOTE — Patient Instructions (Signed)
Medication Instructions:  DECREASE YOUR METOPROLOL TO 50 MG TWICE A DAY   CONTINUE YOUR AMIODARONE 200 MG TWICE A DAY THROUGH 12/2 THEN REDUCE TO 200 MG ONCE A DAY   *If you need a refill on your cardiac medications before your next appointment, please call your pharmacy*  Lab Work: BMET/CBC   If you have labs (blood work) drawn today and your tests are completely normal, you will receive your results only by: MyChart Message (if you have MyChart) OR A paper copy in the mail If you have any lab test that is abnormal or we need to change your treatment, we will call you to review the results.  Testing/Procedures: CARDIOVERSION ON 12/06 SEE BELOW   Follow-Up: At [facility], you and your health needs are our priority.  As part of our continuing mission to provide you with exceptional heart care, we have created designated Provider Care Teams.  These Care Teams include your primary Cardiologist (physician) and Advanced Practice Providers (APPs -  Physician Assistants and Nurse Practitioners) who all work together to provide you with the care you need, when you need it.  We recommend signing up for the patient portal called "MyChart".  Sign up information is provided on this After Visit Summary.  MyChart is used to connect with patients for Virtual Visits (Telemedicine).  Patients are able to view lab/test results, encounter notes, upcoming appointments, etc.  Non-urgent messages can be sent to your provider as well.   To learn more about what you can do with MyChart, go to [email or web address].    Your next appointment:   02/23/2020 AT 10:20 AM WITH DR [facility]   Other Instructions  You are scheduled for a  Cardioversion on 01/15/2022 with Dr. Gardiner Rhyme.  Please arrive at the [facility] (Main Entrance A) at [facility]: [address] [place], [place] [address] at 9:30 am (1 hour prior to procedure unless lab work is needed; if lab work is needed arrive 1.5 hours  ahead)  DIET: Nothing to eat or drink after midnight except a sip of water with medications (see medication instructions below)  FYI: For your safety, and to allow [place] to monitor your vital signs accurately during the surgery/procedure we request that   if you have artificial nails, gel coating, SNS etc. Please have those removed prior to your surgery/procedure. Not having the nail coverings /polish removed may result in cancellation or delay of your surgery/procedure.   Medication Instructions:  Continue your anticoagulant: XARELTO  You will need to continue your anticoagulant after your procedure until you are told by your  Provider that it is safe to stop  Labs:  Come to: [place] must have a responsible person to drive you home and stay in the waiting area during your procedure. Failure to do so could result in cancellation.  Bring your insurance cards.  *Special Note: Every effort is made to have your procedure done on time. Occasionally there are emergencies that occur at the hospital that may cause delays. Please be patient if a delay does occur.

## 2021-01-02 ENCOUNTER — Encounter (HOSPITAL_COMMUNITY): Payer: Self-pay | Admitting: Cardiology

## 2021-01-11 LAB — CBC WITH DIFFERENTIAL/PLATELET
Basophils Absolute: 0 10*3/uL (ref 0.0–0.2)
Basos: 1 %
EOS (ABSOLUTE): 0.2 10*3/uL (ref 0.0–0.4)
Eos: 5 %
Hematocrit: 23 % — ABNORMAL LOW (ref 34.0–46.6)
Hemoglobin: 7.9 g/dL — ABNORMAL LOW (ref 11.1–15.9)
Immature Grans (Abs): 0 10*3/uL (ref 0.0–0.1)
Immature Granulocytes: 0 %
Lymphocytes Absolute: 1.4 10*3/uL (ref 0.7–3.1)
Lymphs: 35 %
MCH: 36.9 pg — ABNORMAL HIGH (ref 26.6–33.0)
MCHC: 34.3 g/dL (ref 31.5–35.7)
MCV: 108 fL — ABNORMAL HIGH (ref 79–97)
Monocytes Absolute: 0.4 10*3/uL (ref 0.1–0.9)
Monocytes: 10 %
NRBC: 1 % — ABNORMAL HIGH (ref 0–0)
Neutrophils Absolute: 2.1 10*3/uL (ref 1.4–7.0)
Neutrophils: 49 %
Platelets: 213 10*3/uL (ref 150–450)
RBC: 2.14 x10E6/uL — CL (ref 3.77–5.28)
RDW: 15.8 % — ABNORMAL HIGH (ref 11.7–15.4)
WBC: 4.2 10*3/uL (ref 3.4–10.8)

## 2021-01-11 LAB — BASIC METABOLIC PANEL
BUN/Creatinine Ratio: 17 (ref 12–28)
BUN: 18 mg/dL (ref 8–27)
CO2: 23 mmol/L (ref 20–29)
Calcium: 9.2 mg/dL (ref 8.7–10.3)
Chloride: 107 mmol/L — ABNORMAL HIGH (ref 96–106)
Creatinine, Ser: 1.06 mg/dL — ABNORMAL HIGH (ref 0.57–1.00)
Glucose: 103 mg/dL — ABNORMAL HIGH (ref 70–99)
Potassium: 4.7 mmol/L (ref 3.5–5.2)
Sodium: 140 mmol/L (ref 134–144)
eGFR: 51 mL/min/{1.73_m2} — ABNORMAL LOW (ref >59)

## 2021-01-15 ENCOUNTER — Encounter (HOSPITAL_COMMUNITY)
Admission: RE | Disposition: A | Discharge status: Home / Self Care | Payer: Self-pay | Source: Home / Self Care | Attending: Cardiology

## 2021-01-15 ENCOUNTER — Ambulatory Visit (HOSPITAL_COMMUNITY)
Admission: RE | Admit: 2021-01-15 | Discharge: 2021-01-15 | Disposition: A | Discharge status: Home / Self Care | Payer: Medicare Other | Attending: Cardiology | Admitting: Cardiology

## 2021-01-15 ENCOUNTER — Other Ambulatory Visit: Payer: Self-pay

## 2021-01-15 DIAGNOSIS — I4819 Other persistent atrial fibrillation: Secondary | ICD-10-CM

## 2021-01-15 DIAGNOSIS — Z538 Procedure and treatment not carried out for other reasons: Secondary | ICD-10-CM | POA: Insufficient documentation

## 2021-01-15 DIAGNOSIS — I4891 Unspecified atrial fibrillation: Secondary | ICD-10-CM | POA: Insufficient documentation

## 2021-01-15 SURGERY — CANCELLED PROCEDURE

## 2021-01-15 NOTE — H&P (Signed)
Presented for cardioversion, found to be in A-paced rhythm, confirmed on device interrogation.  Cardioversion cancelled.

## 2021-01-15 NOTE — Progress Notes (Signed)
Pt arrived for DCCV in [facility]. Verified by MD Gardiner Rhyme and EKG. Procedure cancelled

## 2021-01-16 ENCOUNTER — Other Ambulatory Visit: Payer: Medicare Other

## 2021-01-16 ENCOUNTER — Ambulatory Visit: Payer: Medicare Other | Admitting: Internal Medicine

## 2021-01-16 ENCOUNTER — Other Ambulatory Visit: Payer: Self-pay | Admitting: Student

## 2021-01-17 ENCOUNTER — Ambulatory Visit (HOSPITAL_COMMUNITY)
Admission: RE | Admit: 2021-01-17 | Discharge: 2021-01-17 | Disposition: A | Discharge status: Home / Self Care | Payer: Medicare Other | Source: Ambulatory Visit | Attending: Physician Assistant | Admitting: Physician Assistant

## 2021-01-17 ENCOUNTER — Encounter (HOSPITAL_COMMUNITY): Payer: Self-pay

## 2021-01-17 ENCOUNTER — Other Ambulatory Visit: Payer: Self-pay

## 2021-01-17 DIAGNOSIS — D464 Refractory anemia, unspecified: Secondary | ICD-10-CM | POA: Insufficient documentation

## 2021-01-17 DIAGNOSIS — E538 Deficiency of other specified B group vitamins: Secondary | ICD-10-CM | POA: Insufficient documentation

## 2021-01-17 LAB — CBC WITH DIFFERENTIAL/PLATELET
Abs Immature Granulocytes: 0.01 10*3/uL (ref 0.00–0.07)
Basophils Absolute: 0 10*3/uL (ref 0.0–0.1)
Basophils Relative: 1 %
Eosinophils Absolute: 0.2 10*3/uL (ref 0.0–0.5)
Eosinophils Relative: 5 %
HCT: 26.8 % — ABNORMAL LOW (ref 36.0–46.0)
Hemoglobin: 8.9 g/dL — ABNORMAL LOW (ref 12.0–15.0)
Immature Granulocytes: 0 %
Lymphocytes Relative: 34 %
Lymphs Abs: 1.6 10*3/uL (ref 0.7–4.0)
MCH: 37.9 pg — ABNORMAL HIGH (ref 26.0–34.0)
MCHC: 33.2 g/dL (ref 30.0–36.0)
MCV: 114 fL — ABNORMAL HIGH (ref 80.0–100.0)
Monocytes Absolute: 0.4 10*3/uL (ref 0.1–1.0)
Monocytes Relative: 9 %
Neutro Abs: 2.4 10*3/uL (ref 1.7–7.7)
Neutrophils Relative %: 51 %
Platelets: 234 10*3/uL (ref 150–400)
RBC: 2.35 MIL/uL — ABNORMAL LOW (ref 3.87–5.11)
RDW: 16.8 % — ABNORMAL HIGH (ref 11.5–15.5)
WBC: 4.6 10*3/uL (ref 4.0–10.5)
nRBC: 0 % (ref 0.0–0.2)

## 2021-01-17 MED ORDER — NALOXONE HCL 0.4 MG/ML IJ SOLN
INTRAMUSCULAR | Status: AC
  Filled 2021-01-17: qty 1

## 2021-01-17 MED ORDER — MIDAZOLAM HCL 2 MG/2ML IJ SOLN
INTRAMUSCULAR | Status: AC
  Filled 2021-01-17: qty 4

## 2021-01-17 MED ORDER — FENTANYL CITRATE (PF) 100 MCG/2ML IJ SOLN
INTRAMUSCULAR | Status: AC
  Filled 2021-01-17: qty 4

## 2021-01-17 MED ORDER — FLUMAZENIL 0.5 MG/5ML IV SOLN
INTRAVENOUS | Status: AC
  Filled 2021-01-17: qty 5

## 2021-01-17 MED ORDER — MIDAZOLAM HCL 2 MG/2ML IJ SOLN
INTRAMUSCULAR | Status: DC | PRN
  Administered 2021-01-17 (×2): .5 mg via INTRAVENOUS

## 2021-01-17 MED ORDER — SODIUM CHLORIDE 0.9 % IV SOLN: INTRAVENOUS | Status: DC

## 2021-01-17 MED ORDER — FENTANYL CITRATE (PF) 100 MCG/2ML IJ SOLN
INTRAMUSCULAR | Status: DC | PRN
  Administered 2021-01-17 (×2): 25 ug via INTRAVENOUS

## 2021-01-17 NOTE — H&P (Signed)
Chief Complaint: Patient was seen in consultation today for image guided BMBx  at the request of [name],[name]  Referring Physician(s): [name],[name]  Supervising Physician: Markus Daft  Patient Status: [name] - Out-pt  History of Present Illness: Megan Hester is a 87 y.o. female with PMHs of HTN, s/p pacemaker placement, right breast CA, and persistent macrocytic anemia most likely secondary to B12 deficiency due to SBO s/p small bowel resection in December 2022.  Patient has been followed by hematology and oncology and has been receiving B12 injections.  Patient developed worsening of weakness and lightheadedness despite stable hemoglobin, a bone marrow biopsy was recommended to the patient for further evaluation and management.   Patient presents to [facility] IR today for BMBx.   Patient laying in bed, not in acute distress.  She states that she has controlled a-fib and worried that she may go into a-fib.  Informed the patient that it would be very rare for her to go into a-fib during the procedure, but our sedation nurses will monitor the patient closely during the procedure and address issues that may occur.  [name] headache, fever, chills, shortness of breath, cough, chest pain, abdominal pain, nausea ,vomiting, and bleeding.  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    Past Surgical History:  Procedure Laterality Date   APPLICATION OF WOUND VAC Bilateral 02/01/2019   Procedure: APPLICATION OF WOUND VAC;  Surgeon: [name], MD;  Location: [facility];  Service: General;  Laterality: Bilateral;   BREAST BIOPSY Right     BREAST LUMPECTOMY Right 1999   RADIATION   CARDIAC CATHETERIZATION  02/05/1999   HYPERDYNAMIC LEFT VENTRICLE WITH EF OF 75-80%   CARDIOVERSION N/A 01/22/2015   Procedure: CARDIOVERSION;  Surgeon: [name], MD;  Location: [place];  Service: Cardiovascular;  Laterality: N/A;   CARDIOVERSION N/A 02/22/2015   Procedure: CARDIOVERSION;  Surgeon: [name], MD;  Location: [place];  Service: Cardiovascular;  Laterality: N/A;   DILATION AND CURETTAGE OF UTERUS     ENUCLEATION Right 2012   EP IMPLANTABLE DEVICE N/A 02/25/2016   Procedure: Pacemaker Implant;  Surgeon: [name], MD;  Location: [place] [facility];  Service: Cardiovascular;  Laterality: N/A;   INSERT / REPLACE / REMOVE PACEMAKER     LAPAROSCOPIC SMALL BOWEL RESECTION N/A 02/01/2019   Procedure: LAPAROSCOPIC SMALL BOWEL RESECTION WITH TAP BLOCK;  Surgeon: [name], MD;  [facility];  Service: General;  Laterality: N/A;   LAPAROSCOPY N/A 02/01/2019   Procedure: LAPAROSCOPY DIAGNOSTIC;  Surgeon: [name], MD;  [facility];  Service: General;  Laterality: N/A;   LYSIS OF ADHESION N/A 02/01/2019   Procedure: LYSIS OF ADHESION;  Surgeon: [name], MD;  [facility];  Service: General;  Laterality: N/A;   TONSILLECTOMY      Allergies: Shellfish allergy, Atropine, Codeine, Doxycycline, Erythromycin, Fenoprofen calcium, Iodinated diagnostic agents, Isopto hyoscine [scopolamine], Levaquin [levofloxacin], Naproxen, Other, Oxycodone, Penicillins, Sulfa drugs cross reactors, Warfarin, Ceclor [cefaclor], and Tape  Medications: Prior to Admission medications   Medication Sig Start Date End  Date Taking? Authorizing Provider  amiodarone (PACERONE) 200 MG tablet Take 200 mg by mouth 2 (two) times daily.  01/15/21  [provider]  cetirizine (ZYRTEC) 10 MG tablet Take 10 mg by mouth daily with supper.    [provider]  Cholecalciferol (VITAMIN D) 1000 UNITS capsule Take  1,000 Units by mouth daily with supper.    [provider]  clotrimazole-betamethasone (LOTRISONE) cream Apply 1 application topically 2 (two) times daily. Female parts 12/31/20   [provider]  Hypromellose (ARTIFICIAL TEARS OP) Place 1 drop into the left eye daily as needed (dry eye).    [provider]  Melatonin 10 MG TABS Take 10 mg by mouth 2 (two) times daily. Supper and bedime    [provider]  metoprolol tartrate (LOPRESSOR) 50 MG tablet Take 1 tablet (50 mg total) by mouth 2 (two) times daily. 01/01/21   Donato Heinz, MD  Multiple Vitamins-Minerals (MULTIVITAMIN WITH MINERALS) tablet Take 1 tablet by mouth 2 (two) times daily.    [provider]  Multiple Vitamins-Minerals (PRESERVISION/LUTEIN PO) Take 1 capsule by mouth 2 (two) times daily.     [provider]  Polyethyl Glycol-Propyl Glycol (SYSTANE OP) Place 1 drop into the right eye daily as needed (dry eye). gel    [provider]  polyethylene glycol (MIRALAX / GLYCOLAX) packet Take 17 g by mouth every evening.    [provider]  QUEtiapine (SEROQUEL) 25 MG tablet Take 25 mg by mouth at bedtime. 12/11/19   [provider]  sertraline (ZOLOFT) 100 MG tablet Take 100 mg by mouth at bedtime.    [provider]  XARELTO 15 MG TABS tablet TAKE 1 TABLET ONCE DAILY WITH SUPPER. 09/21/20   Allred, [name], MD     Family History  Problem Relation Age of Onset   Hypertension Father    Heart attack Father    Parkinsonism Father    Diabetes Brother    Heart disease Brother    Lung cancer Brother    Stroke Mother     Social History   Socioeconomic History   Marital status: Married    Spouse name: Not on file   Number of children: 3   Years of education: Not on file   Highest education level: Not on file  Occupational History    Employer: RETIRED  Tobacco Use   Smoking status: Never   Smokeless tobacco: Never  Vaping Use    Vaping Use: Never used  Substance and Sexual Activity   Alcohol use: Yes    Alcohol/week: 0.0 standard drinks    Comment: 02/25/2016 "glass of wine/month"   Drug use: No   Sexual activity: Yes    Birth control/protection: Post-menopausal    Comment: 1st intercourse 55 yo-1 partner  Other Topics Concern   Not on file  Social History Narrative   Lives in [place].  Retired Solicitor.   Social Determinants of Health   Financial Resource Strain: Not on file  Food Insecurity: Not on file  Transportation Needs: Not on file  Physical Activity: Not on file  Stress: Not on file  Social Connections: Not on file     Review of Systems: A 12 point ROS discussed and pertinent positives are indicated in the HPI above.  All other systems are negative.  Vital Signs: BP (!) 142/77   Pulse 60   Temp 97.9 F (36.6 C) (Oral)   Resp 16   Ht 5' 4"  (1.626 m)  Wt 136 lb (61.7 kg)   SpO2 99%   BMI 23.34 kg/m   Physical Exam Vitals reviewed.  Constitutional:      General: She is not in acute distress.    Appearance: Normal appearance. She is not ill-appearing.  HENT:     Head: Normocephalic and atraumatic.  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     Heart sounds: Normal heart sounds.  Pulmonary:     Effort: Pulmonary effort is normal.     Breath sounds: Normal breath sounds.  Abdominal:     General: Abdomen is flat. Bowel sounds are normal.     Palpations: Abdomen is soft.  Musculoskeletal:     Cervical back: Normal range of motion and neck supple.  Skin:    General: Skin is warm and dry.     Coloration: Skin is not jaundiced or pale.  Neurological:     Mental Status: She is alert and oriented to person, place, and time.  Psychiatric:        Mood and Affect: Mood normal.        Behavior: Behavior normal.        Judgment: Judgment normal.    MD  Evaluation Airway: WNL Heart: WNL Abdomen: WNL Chest/ Lungs: WNL ASA  Classification: 2 Mallampati/Airway Score: Two  Imaging: CUP PACEART INCLINIC DEVICE CHECK  Result Date: 12/28/2020 Pacemaker check in clinic. Normal device function. Thresholds, sensing, impedances consistent with previous measurements. Device programmed to maximize longevity. + mode switch or high ventricular rates noted. Device programmed at appropriate safety margins. Histogram distribution appropriate for patient activity level. Device programmed to optimize intrinsic conduction. Estimated longevity _3.5 years__. Patient enrolled in remote follow-up. Patient education completed.   Labs:  CBC: Recent Labs    09/11/20 0921 12/13/20 1019 01/10/21 1152 01/17/21 0940  WBC 3.8* 4.7 4.2 4.6  HGB 9.0* 9.1* 7.9* 8.9*  HCT 26.9* 26.9* 23.0* 26.8*  PLT 158 234 213 234    COAGS: Recent Labs    06/30/20 0958  INR 1.5*    BMP: Recent Labs    03/22/20 1128 06/30/20 0958 08/17/20 1007 08/28/20 1123 01/10/21 1152  NA 143 138 140 140 140  K 5.0 4.2 5.1 4.3 4.7  CL 107* 105 106 108 107*  CO2 24 24 21 24 23   GLUCOSE 83 97 90 114* 103*  BUN 15 18 21 21 18   CALCIUM 9.6 9.4 9.3 8.9 9.2  CREATININE 0.95 0.79 1.01* 1.02* 1.06*  GFRNONAA 54* >60  --  53*  --   GFRAA 62  --   --   --   --     LIVER FUNCTION TESTS: Recent Labs    06/30/20 0958 08/17/20 1007 08/28/20 1123  BILITOT 1.5* 0.6 0.8  AST 17 13 13*  ALT 11 7 7   ALKPHOS 99 121 107  PROT 7.1 6.2 6.4*  ALBUMIN 4.3 4.4 3.7    TUMOR MARKERS: No results for input(s): AFPTM, CEA, CA199, CHROMGRNA in the last 8760 hours.  Assessment and Plan: 87 y.o. female with persistent anemia after small bowel resection due to SOB in December 2020. Worsening of weakness and  shortness of breath despite stable hgb,  in need of BMBx for further eval and management.   NPO sine MN VSS CBC stable On Xarelto for A fib, no need for  d/c   Risks and benefits of  BMbx was discussed with the patient and/or patient's family including, but not limited to bleeding, infection, damage to adjacent structures or low yield requiring additional tests.  All of the questions were answered and there is agreement to proceed.  Consent signed and in chart.   Thank you for this interesting consult.  I greatly enjoyed meeting [facility] and look forward to participating in their care.  A copy of this report was sent to the requesting provider on this date.  Electronically Signed: Tera Mater, PA-C 01/17/2021, 10:40 AM   I spent a total of  30 Minutes   in face to face in clinical consultation, greater than 50% of which was counseling/coordinating care for BMBx.  This chart was dictated using voice recognition software.  Despite best efforts to proofread,  errors can occur which can change the documentation meaning.

## 2021-01-17 NOTE — Progress Notes (Signed)
Pressure dressing applied.

## 2021-01-17 NOTE — Discharge Instructions (Addendum)

## 2021-01-17 NOTE — Procedures (Signed)
Interventional Radiology Procedure:   Indications: Refractory anemia.  B12 deficiency  Procedure: CT guided bone marrow biopsy  Findings: 2 aspirates and 1 core from right ilium  Complications: None     EBL: Minimal, less than 10 ml  Plan: Discharge to home in one hour.   Keely Drennan R. Anselm Pancoast, MD  Pager: [phone]

## 2021-01-18 LAB — SURGICAL PATHOLOGY

## 2021-01-23 ENCOUNTER — Encounter: Payer: Self-pay | Admitting: Internal Medicine

## 2021-01-23 ENCOUNTER — Other Ambulatory Visit: Payer: Self-pay

## 2021-01-23 ENCOUNTER — Inpatient Hospital Stay: Payer: Medicare Other | Attending: Internal Medicine

## 2021-01-23 ENCOUNTER — Other Ambulatory Visit: Payer: Self-pay | Admitting: Medical Oncology

## 2021-01-23 ENCOUNTER — Inpatient Hospital Stay: Payer: Medicare Other | Admitting: Internal Medicine

## 2021-01-23 VITALS — BP 99/63 | HR 73 | Temp 98.1°F | Resp 19 | Ht 64.0 in | Wt 140.6 lb

## 2021-01-23 DIAGNOSIS — E538 Deficiency of other specified B group vitamins: Secondary | ICD-10-CM

## 2021-01-23 DIAGNOSIS — D4620 Refractory anemia with excess of blasts, unspecified: Secondary | ICD-10-CM

## 2021-01-23 DIAGNOSIS — Z86000 Personal history of in-situ neoplasm of breast: Secondary | ICD-10-CM | POA: Diagnosis not present

## 2021-01-23 DIAGNOSIS — K219 Gastro-esophageal reflux disease without esophagitis: Secondary | ICD-10-CM | POA: Diagnosis not present

## 2021-01-23 DIAGNOSIS — Z79899 Other long term (current) drug therapy: Secondary | ICD-10-CM | POA: Insufficient documentation

## 2021-01-23 DIAGNOSIS — D464 Refractory anemia, unspecified: Secondary | ICD-10-CM

## 2021-01-23 LAB — CBC WITH DIFFERENTIAL (CANCER CENTER ONLY)
Abs Immature Granulocytes: 0.01 10*3/uL (ref 0.00–0.07)
Basophils Absolute: 0 10*3/uL (ref 0.0–0.1)
Basophils Relative: 1 %
Eosinophils Absolute: 0.2 10*3/uL (ref 0.0–0.5)
Eosinophils Relative: 3 %
HCT: 24.2 % — ABNORMAL LOW (ref 36.0–46.0)
Hemoglobin: 7.9 g/dL — ABNORMAL LOW (ref 12.0–15.0)
Immature Granulocytes: 0 %
Lymphocytes Relative: 34 %
Lymphs Abs: 2 10*3/uL (ref 0.7–4.0)
MCH: 36.9 pg — ABNORMAL HIGH (ref 26.0–34.0)
MCHC: 32.6 g/dL (ref 30.0–36.0)
MCV: 113.1 fL — ABNORMAL HIGH (ref 80.0–100.0)
Monocytes Absolute: 0.5 10*3/uL (ref 0.1–1.0)
Monocytes Relative: 8 %
Neutro Abs: 3.1 10*3/uL (ref 1.7–7.7)
Neutrophils Relative %: 54 %
Platelet Count: 193 10*3/uL (ref 150–400)
RBC: 2.14 MIL/uL — ABNORMAL LOW (ref 3.87–5.11)
RDW: 16.4 % — ABNORMAL HIGH (ref 11.5–15.5)
WBC Count: 5.8 10*3/uL (ref 4.0–10.5)
nRBC: 0 % (ref 0.0–0.2)

## 2021-01-23 LAB — SAMPLE TO BLOOD BANK

## 2021-01-23 NOTE — Progress Notes (Signed)
[place] [phone]   [phone]  OFFICE PROGRESS NOTE  [name], MD [place] [address]  DIAGNOSIS:  Low-grade myelodysplastic syndrome with ring sideroblasts.  There was also an element of vitamin B12 deficiency after small bowel resection for an obstruction in December 2020.  PRIOR THERAPY:None  CURRENT THERAPY:  1) Vitamin B12 injection initially weekly then every 2 weeks and then monthly. 2) supportive care with transfusion on as-needed basis  INTERVAL HISTORY: Megan Hester 87 y.o. female returns to the clinic today for follow-up visit accompanied by her daughter.  The patient is feeling fine today with no concerning complaints except for the baseline fatigue.  She tolerated her vitamin B12 injection fairly well.  She recently underwent a bone marrow biopsy and aspirate and she is here for evaluation and discussion of her biopsy results.  She has no chest pain, shortness of breath, cough or hemoptysis.  She denied having any fever or chills.  She has no nausea, vomiting, diarrhea or constipation.  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  MEDICATIONS:  Current Outpatient Medications  Medication Sig Dispense Refill   amiodarone (PACERONE) 200 MG  tablet Take 200 mg by mouth 2 (two) times daily.     cetirizine (ZYRTEC) 10 MG tablet Take 10 mg by mouth daily with supper.     Cholecalciferol (VITAMIN D) 1000 UNITS capsule Take 1,000 Units by mouth daily with supper.     clotrimazole-betamethasone (LOTRISONE) cream Apply 1 application topically 2 (two) times daily. Female parts     Hypromellose (ARTIFICIAL TEARS OP) Place 1 drop into the left eye daily as needed (dry eye).     Melatonin 10 MG TABS Take 10 mg by mouth 2 (two) times daily. Supper and bedime     metoprolol tartrate (LOPRESSOR) 50 MG tablet Take 1 tablet (50 mg total) by mouth 2 (two) times daily. 180 tablet 1   Multiple Vitamins-Minerals (MULTIVITAMIN WITH MINERALS) tablet Take 1 tablet by mouth 2 (two) times daily.     Multiple Vitamins-Minerals (PRESERVISION/LUTEIN PO) Take 1 capsule by mouth 2 (two) times daily.      Polyethyl Glycol-Propyl Glycol (SYSTANE OP) Place 1 drop into the right eye daily as needed (dry eye). gel     polyethylene glycol (MIRALAX / GLYCOLAX) packet Take 17 g by mouth every evening.     QUEtiapine (SEROQUEL) 25 MG tablet Take 25 mg by mouth at bedtime.     sertraline (ZOLOFT) 100 MG tablet Take 100 mg by mouth at bedtime.     XARELTO 15 MG TABS tablet TAKE 1 TABLET ONCE DAILY WITH SUPPER. 30 tablet 5   No current facility-administered medications for this visit.    SURGICAL HISTORY:  Past Surgical History:  Procedure Laterality Date   APPLICATION OF WOUND VAC Bilateral  02/01/2019   Procedure: APPLICATION OF WOUND VAC;  Surgeon: [name], MD;  [facility];  Service: General;  Laterality: Bilateral;   BREAST BIOPSY Right    BREAST LUMPECTOMY Right 1999   RADIATION   CARDIAC CATHETERIZATION  02/05/1999   HYPERDYNAMIC LEFT VENTRICLE WITH EF OF 75-80%   CARDIOVERSION N/A 01/22/2015   Procedure: CARDIOVERSION;  Surgeon: [name], MD;  Location: [place];  Service: Cardiovascular;  Laterality: N/A;   CARDIOVERSION N/A  02/22/2015   Procedure: CARDIOVERSION;  Surgeon: [name], MD;  Location: [place];  Service: Cardiovascular;  Laterality: N/A;   DILATION AND CURETTAGE OF UTERUS     ENUCLEATION Right 2012   EP IMPLANTABLE DEVICE N/A 02/25/2016   Procedure: Pacemaker Implant;  Surgeon: [name], MD;  Location: [place] [facility];  Service: Cardiovascular;  Laterality: N/A;   INSERT / REPLACE / REMOVE PACEMAKER     LAPAROSCOPIC SMALL BOWEL RESECTION N/A 02/01/2019   Procedure: LAPAROSCOPIC SMALL BOWEL RESECTION WITH TAP BLOCK;  Surgeon: [name], MD;  [facility];  Service: General;  Laterality: N/A;   LAPAROSCOPY N/A 02/01/2019   Procedure: LAPAROSCOPY DIAGNOSTIC;  Surgeon: [name], MD;  [facility];  Service: General;  Laterality: N/A;   LYSIS OF ADHESION N/A 02/01/2019   Procedure: LYSIS OF ADHESION;  Surgeon: [name], MD;  [facility];  Service: General;  Laterality: N/A;   TONSILLECTOMY      REVIEW OF SYSTEMS:  Constitutional: positive for fatigue Eyes: negative Ears, nose, mouth, throat, and face: negative Respiratory: negative Cardiovascular: negative Gastrointestinal: negative Genitourinary:negative Integument/breast: negative Hematologic/lymphatic: negative Musculoskeletal:positive for arthralgias and muscle weakness Neurological: negative Behavioral/Psych: negative Endocrine: negative Allergic/Immunologic: negative   PHYSICAL EXAMINATION: General appearance: alert, cooperative, fatigued, and no distress Head: Normocephalic, without obvious abnormality, atraumatic Neck: no adenopathy, no JVD, supple, symmetrical, trachea midline, and thyroid not enlarged, symmetric, no tenderness/mass/nodules Lymph nodes: Cervical, supraclavicular, and axillary nodes normal. Resp: clear to auscultation bilaterally Back: symmetric, no curvature. ROM normal. No CVA tenderness. Cardio: regular rate and rhythm, S1, S2 normal, no murmur, click, rub or gallop GI:  soft, non-tender; bowel sounds normal; no masses,  no organomegaly Extremities: extremities normal, atraumatic, no cyanosis or edema Neurologic: Alert and oriented X 3, normal strength and tone. Normal symmetric reflexes. Normal coordination and gait  ECOG PERFORMANCE STATUS: 1 - Symptomatic but completely ambulatory  Blood pressure 99/63, pulse 73, temperature 98.1 F (36.7 C), temperature source Tympanic, resp. rate 19, height 5' 4"  (1.626 m), weight 140 lb 9.6 oz (63.8 kg), SpO2 99 %.  LABORATORY DATA: Lab Results  Component Value Date   WBC 5.8 01/23/2021   HGB 7.9 (L) 01/23/2021   HCT 24.2 (L) 01/23/2021   MCV 113.1 (H) 01/23/2021   PLT 193 01/23/2021      Chemistry      Component Value Date/Time   NA 140 01/10/2021 1152   K 4.7 01/10/2021 1152   CL 107 (H) 01/10/2021 1152   CO2 23 01/10/2021 1152   BUN 18 01/10/2021 1152   CREATININE 1.06 (H) 01/10/2021 1152   CREATININE 1.02 (H) 08/28/2020 1123      Component Value Date/Time   CALCIUM 9.2 01/10/2021 1152   ALKPHOS 107 08/28/2020 1123   AST 13 (L) 08/28/2020 1123   ALT 7 08/28/2020 1123   BILITOT 0.8 08/28/2020 1123       RADIOGRAPHIC STUDIES: CT Biopsy  Result Date: 01/22/21 INDICATION: Refractory anemia of unknown etiology.  B12 deficiency.  EXAM: CT GUIDED BONE MARROW ASPIRATES AND BIOPSY Physician: Stephan Minister. Anselm Pancoast, MD MEDICATIONS: None. ANESTHESIA/SEDATION: Moderate (conscious) sedation was employed during this procedure. A total of Versed 1.80m and fentanyl 50 mcg was administered intravenously at the order of the provider performing the procedure. Total intra-service moderate sedation time: 12 minutes. Patient's level of consciousness and vital signs were monitored continuously by radiology nurse throughout the procedure under the supervision of the provider performing the procedure. COMPLICATIONS: None immediate. PROCEDURE: The procedure was explained to the patient. The risks and benefits of the procedure were  discussed and the patient's questions were addressed. Informed consent was obtained from the patient. The patient was placed prone on CT table. Images of the pelvis were obtained. The right side of back was prepped and draped in sterile fashion. The skin and right posterior ilium were anesthetized with 1% lidocaine. 11 gauge bone needle was directed into the right ilium with CT guidance. Two aspirates and one core biopsy were obtained. Bandage placed over the puncture site. FINDINGS: Biopsy needle directed into the posterior right ilium. IMPRESSION: CT guided bone marrow aspiration and core biopsy. Electronically Signed   By: AMarkus DaftM.D.   On: 01/17/2021 17:29   CT BONE MARROW BIOPSY & ASPIRATION  Result Date: 01/17/2021 INDICATION: Refractory anemia of unknown etiology.  B12 deficiency. EXAM: CT GUIDED BONE MARROW ASPIRATES AND BIOPSY Physician: AStephan Minister HAnselm Pancoast MD MEDICATIONS: None. ANESTHESIA/SEDATION: Moderate (conscious) sedation was employed during this procedure. A total of Versed 1.036mand fentanyl 50 mcg was administered intravenously at the order of the provider performing the procedure. Total intra-service moderate sedation time: 12 minutes. Patient's level of consciousness and vital signs were monitored continuously by radiology nurse throughout the procedure under the supervision of the provider performing the procedure. COMPLICATIONS: None immediate. PROCEDURE: The procedure was explained to the patient. The risks and benefits of the procedure were discussed and the patient's questions were addressed. Informed consent was obtained from the patient. The patient was placed prone on CT table. Images of the pelvis were obtained. The right side of back was prepped and draped in sterile fashion. The skin and right posterior ilium were anesthetized with 1% lidocaine. 11 gauge bone needle was directed into the right ilium with CT guidance. Two aspirates and one core biopsy were obtained. Bandage placed  over the puncture site. FINDINGS: Biopsy needle directed into the posterior right ilium. IMPRESSION: CT guided bone marrow aspiration and core biopsy. Electronically Signed   By: AdMarkus Daft.D.   On: 01/17/2021 17:29   CUP PACEART INCLINIC DEVICE CHECK  Result Date: 12/28/2020 Pacemaker check in clinic. Normal device function. Thresholds, sensing, impedances consistent with previous measurements. Device programmed to maximize longevity. + mode switch or high ventricular rates noted. Device programmed at appropriate safety margins. Histogram distribution appropriate for patient activity level. Device programmed to optimize intrinsic conduction. Estimated longevity _3.5 years__. Patient enrolled in remote follow-up. Patient education completed.   ASSESSMENT AND PLAN: This is a very pleasant [age] old white female with recently diagnosed low-grade myelodysplastic syndrome in addition to questionable vitamin B12 deficiency.   The patient had a bone marrow biopsy and aspirate performed recently.   I discussed the results with the patient and her daughter-in-law.  I recommended for her to continue with supportive care for now.  We are still waiting on the cytogenetics to see if the patient would benefit from treatment with Revlimid if she has 5 q. deletion. For the persistent anemia, I will  arrange for the patient to receive 2 units of PRBCs transfusion in the next few days. I will see her back for follow-up visit in 2 months for evaluation and repeat blood work. The patient was advised to call immediately if she has any other concerning symptoms in the interval. The patient voices understanding of current disease status and treatment options and is in agreement with the current care plan.  All questions were answered. The patient knows to call the clinic with any problems, questions or concerns. We can certainly see the patient much sooner if necessary.  The total time spent in the appointment was 20  minutes.  Disclaimer: This note was dictated with voice recognition software. Similar sounding words can inadvertently be transcribed and may not be corrected upon review.

## 2021-01-24 ENCOUNTER — Other Ambulatory Visit: Payer: Self-pay | Admitting: Medical Oncology

## 2021-01-24 ENCOUNTER — Encounter (HOSPITAL_COMMUNITY): Payer: Self-pay

## 2021-01-24 DIAGNOSIS — D4620 Refractory anemia with excess of blasts, unspecified: Secondary | ICD-10-CM

## 2021-01-24 LAB — PREPARE RBC (CROSSMATCH)

## 2021-01-24 NOTE — Progress Notes (Signed)
Type and screen and prepare released. 

## 2021-01-25 ENCOUNTER — Other Ambulatory Visit: Payer: Self-pay | Admitting: Physician Assistant

## 2021-01-25 ENCOUNTER — Other Ambulatory Visit: Payer: Self-pay

## 2021-01-25 ENCOUNTER — Inpatient Hospital Stay: Payer: Medicare Other

## 2021-01-25 DIAGNOSIS — D4620 Refractory anemia with excess of blasts, unspecified: Secondary | ICD-10-CM | POA: Diagnosis not present

## 2021-01-25 DIAGNOSIS — D649 Anemia, unspecified: Secondary | ICD-10-CM

## 2021-01-25 MED ORDER — ACETAMINOPHEN 325 MG PO TABS
650.0000 mg | ORAL_TABLET | Freq: Once | ORAL | Status: AC
  Administered 2021-01-25: 650 mg via ORAL
  Filled 2021-01-25: qty 2

## 2021-01-25 MED ORDER — DIPHENHYDRAMINE HCL 25 MG PO CAPS
25.0000 mg | ORAL_CAPSULE | Freq: Once | ORAL | Status: AC
  Administered 2021-01-25: 25 mg via ORAL
  Filled 2021-01-25: qty 1

## 2021-01-25 MED ORDER — SODIUM CHLORIDE 0.9% IV SOLUTION
250.0000 mL | Freq: Once | INTRAVENOUS | Status: AC
  Administered 2021-01-25: 250 mL via INTRAVENOUS

## 2021-01-25 NOTE — Patient Instructions (Signed)
Blood Transfusion, Adult, Care After This sheet gives you information about how to care for yourself after your procedure. Your doctor may also give you more specific instructions. If you have problems or questions, contact your doctor. What can I expect after the procedure? After the procedure, it is common to have: Bruising and soreness at the IV site. A headache. Follow these instructions at home: Insertion site care   Follow instructions from your doctor about how to take care of your insertion site. This is where an IV tube was put into your vein. Make sure you: Wash your hands with soap and water before and after you change your bandage (dressing). If you cannot use soap and water, use hand sanitizer. Change your bandage as told by your doctor. Check your insertion site every day for signs of infection. Check for: Redness, swelling, or pain. Bleeding from the site. Warmth. Pus or a bad smell. General instructions Take over-the-counter and prescription medicines only as told by your doctor. Rest as told by your doctor. Go back to your normal activities as told by your doctor. Keep all follow-up visits as told by your doctor. This is important. Contact a doctor if: You have itching or red, swollen areas of skin (hives). You feel worried or nervous (anxious). You feel weak after doing your normal activities. You have redness, swelling, warmth, or pain around the insertion site. You have blood coming from the insertion site, and the blood does not stop with pressure. You have pus or a bad smell coming from the insertion site. Get help right away if: You have signs of a serious reaction. This may be coming from an allergy or the body's defense system (immune system). Signs include: Trouble breathing or shortness of breath. Swelling of the face or feeling warm (flushed). Fever or chills. Head, chest, or back pain. Dark pee (urine) or blood in the pee. Widespread rash. Fast  heartbeat. Feeling dizzy or light-headed. You may receive your blood transfusion in an outpatient setting. If so, you will be told whom to contact to report any reactions. These symptoms may be an emergency. Do not wait to see if the symptoms will go away. Get medical help right away. Call your local emergency services (911 in the [place] Do not drive yourself to the hospital. Summary Bruising and soreness at the IV site are common. Check your insertion site every day for signs of infection. Rest as told by your doctor. Go back to your normal activities as told by your doctor. Get help right away if you have signs of a serious reaction. This information is not intended to replace advice given to you by your health care provider. Make sure you discuss any questions you have with your health care provider. Document Revised: 05/24/2020 Document Reviewed: 07/22/2018 Elsevier Patient Education  2022 Elsevier Inc.  

## 2021-01-28 ENCOUNTER — Encounter (HOSPITAL_COMMUNITY): Payer: Self-pay

## 2021-01-28 LAB — BPAM RBC
Blood Product Expiration Date: 202301082359
Blood Product Expiration Date: 202301092359
ISSUE DATE / TIME: 202212160946
ISSUE DATE / TIME: 202212160946
Unit Type and Rh: 9500
Unit Type and Rh: 9500

## 2021-01-28 LAB — TYPE AND SCREEN
ABO/RH(D): O NEG
Antibody Screen: NEGATIVE
Unit division: 0
Unit division: 0

## 2021-01-31 ENCOUNTER — Telehealth: Payer: Self-pay | Admitting: Internal Medicine

## 2021-01-31 NOTE — Telephone Encounter (Signed)
Sch per 12/13 los, left pt message

## 2021-02-07 LAB — SURGICAL PATHOLOGY

## 2021-02-17 NOTE — Progress Notes (Signed)
Cardiology Office Note:    Date:  02/22/2021   ID:  Megan Hester [place], DOB [birth year], MRN [id number]  PCP:  [name], MD  Cardiologist:  Donato Heinz, MD  Electrophysiologist:  [name], MD   Referring MD: [name], MD   Chief complaint: Atrial fibrillation  History of Present Illness:    Megan Hester is a 88 y.o. female with a hx of SBO with small bowel resection 02/01/2019, paroxysmal atrial fibrillation, sick sinus syndrome status post PPM, hypertension, type 2 diabetes, myelodysplastic syndrome who presents for follow-up.  She was admitted to [facility] with SBO and underwent small bowel resection 02/01/2019.  Her postoperative course was complicated by AF with RVR.  Given her hypotension, she was initially started on amiodarone drip.  She was transitioned off amiodarone due to elevated LFTs and started on metoprolol and digoxin for rate control.  Plan on discharge on 02/16/2019 was for 2 weeks of digoxin and then restart flecainide.  She reported that when she restarted flecainide 2 weeks post discharge, she developed extensive lower extremity edema and weakness/dizziness.  Her symptoms resolved with stopping flecainide.  Most recent device check 09/07/2019 showed AF burden 12%, rates controlled.  TTE 02/08/2019 showed LVEF 50 to 55%, normal RV function, indeterminate diastolic function, no significant valvular disease.  TTE 09/19/2020 showed EF 50- 87%, grade 3 diastolic dysfunction, normal RV function, mild to moderate TR, 6 mild aortic dilatation measuring 42 mm, RVSP 36.  Since last clinic visit, she reports that she has been doing well.  She reports having fatigue but denies any lightheadedness or syncope.  Has been getting blood transfusions for her anemia.  Reports occasional chest pain denies any shortness of breath.  No lower extremity edema.  Denies any blood in stool or bleeding.  No fevers or chills.  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    Past Surgical History:  Procedure Laterality Date   APPLICATION OF WOUND VAC Bilateral 02/01/2019   Procedure: APPLICATION OF WOUND VAC;  Surgeon: [name], MD;  Location: [facility];  Service: General;  Laterality: Bilateral;   BREAST BIOPSY Right    BREAST LUMPECTOMY Right 1999   RADIATION   CARDIAC CATHETERIZATION  02/05/1999   HYPERDYNAMIC LEFT VENTRICLE WITH EF OF 75-80%   CARDIOVERSION N/A 01/22/2015   Procedure: CARDIOVERSION;  Surgeon: [name], MD;  Location: [facility];  Service: Cardiovascular;  Laterality: N/A;   CARDIOVERSION N/A 02/22/2015   Procedure: CARDIOVERSION;  Surgeon: [name], MD;  Location: [facility];  Service: Cardiovascular;  Laterality: N/A;   DILATION AND CURETTAGE OF UTERUS     ENUCLEATION Right 2012   EP IMPLANTABLE DEVICE N/A 02/25/2016   Procedure: Pacemaker Implant;  Surgeon: [name], MD;  Location:  [facility];  Service: Cardiovascular;  Laterality: N/A;   INSERT / REPLACE / REMOVE PACEMAKER     LAPAROSCOPIC SMALL BOWEL RESECTION N/A 02/01/2019   Procedure: LAPAROSCOPIC SMALL BOWEL RESECTION WITH TAP BLOCK;  Surgeon: [name], MD;  Location: [facility];  Service: General;  Laterality: N/A;   LAPAROSCOPY N/A 02/01/2019   Procedure: LAPAROSCOPY DIAGNOSTIC;  Surgeon: [name], MD;  Location: [facility];  Service: General;  Laterality: N/A;    LYSIS OF ADHESION N/A 02/01/2019   Procedure: LYSIS OF ADHESION;  Surgeon: [name], MD;  Location: [facility];  Service: General;  Laterality: N/A;   TONSILLECTOMY      Current Medications: Current Meds  Medication Sig   amiodarone (PACERONE) 200 MG tablet Take 200 mg by mouth daily.   cetirizine (ZYRTEC) 10 MG tablet Take 10 mg by mouth daily with supper.   Cholecalciferol (VITAMIN D) 1000 UNITS capsule Take 1,000 Units by mouth daily with supper.   clotrimazole-betamethasone (LOTRISONE) cream Apply 1 application topically 2 (two) times daily. Female parts   Hypromellose (ARTIFICIAL TEARS OP) Place 1 drop into the left eye daily as needed (dry eye).   Melatonin 10 MG TABS Take 10 mg by mouth 2 (two) times daily. Supper and bedime   Multiple Vitamins-Minerals (MULTIVITAMIN WITH MINERALS) tablet Take 1 tablet by mouth 2 (two) times daily.   Multiple Vitamins-Minerals (PRESERVISION/LUTEIN PO) Take 1 capsule by mouth 2 (two) times daily.    Polyethyl Glycol-Propyl Glycol (SYSTANE OP) Place 1 drop into the right eye daily as needed (dry eye). gel   polyethylene glycol (MIRALAX / GLYCOLAX) packet Take 17 g by mouth every evening.   QUEtiapine (SEROQUEL) 25 MG tablet Take 25 mg by mouth at bedtime.   sertraline (ZOLOFT) 100 MG tablet Take 100 mg by mouth at bedtime.   XARELTO 15 MG TABS tablet TAKE 1 TABLET ONCE DAILY WITH SUPPER.   [DISCONTINUED] metoprolol tartrate (LOPRESSOR) 50 MG tablet Take 1 tablet (50 mg total) by mouth 2 (two) times daily.     Allergies:   Shellfish allergy, Atropine, Codeine, Doxycycline, Erythromycin, Fenoprofen calcium, Iodinated contrast media, Isopto hyoscine [scopolamine], Levaquin [levofloxacin], Naproxen, Other, Oxycodone, Penicillins, Sulfa drugs cross reactors, Warfarin, Ceclor [cefaclor], and Tape   Social History   Socioeconomic History   Marital status: Married    Spouse name: Not on file   Number of children: 3   Years of education: Not on file    Highest education level: Not on file  Occupational History    Employer: RETIRED  Tobacco Use   Smoking status: Never   Smokeless tobacco: Never  Vaping Use   Vaping Use: Never used  Substance and Sexual Activity   Alcohol use: Yes    Alcohol/week: 0.0 standard drinks    Comment: 02/25/2016 "glass of wine/month"   Drug use: No   Sexual activity: Yes    Birth control/protection: Post-menopausal    Comment: 1st intercourse 82 yo-1 partner  Other Topics Concern   Not on file  Social History Narrative   Lives in [place].  Retired Solicitor.   Social Determinants of Health   Financial Resource Strain: Not on file  Food Insecurity: Not on file  Transportation Needs: Not on file  Physical Activity: Not on file  Stress: Not on file  Social Connections: Not on file     Family History: The patient's family history includes Diabetes in her brother; Heart attack in her father; Heart disease in her brother; Hypertension in  her father; Lung cancer in her brother; Parkinsonism in her father; Stroke in her mother.  ROS:   Please see the history of present illness.    All other systems reviewed and are negative.  EKGs/Labs/Other Studies Reviewed:    The following studies were reviewed today:  02/08/2019 Echo: 1. Left ventricular ejection fraction, by visual estimation, is 50 to  55%. The left ventricle has low normal function. Left ventricular septal  wall thickness was normal. Normal left ventricular posterior wall  thickness. There is no left ventricular  hypertrophy.   2. Left ventricular diastolic parameters are indeterminate.   3. The left ventricle has no regional wall motion abnormalities.   4. Global right ventricle has normal systolic function.The right  ventricular size is normal. No increase in right ventricular wall  thickness.   5. Left atrial size was normal.   6. Right atrial size was normal.   7. The mitral valve is normal in structure. Trivial mitral  valve  regurgitation. No evidence of mitral stenosis.   8. The tricuspid valve is normal in structure.   9. The aortic valve is tricuspid. Aortic valve regurgitation is not  visualized. No evidence of aortic valve sclerosis or stenosis.  10. The pulmonic valve was normal in structure. Pulmonic valve  regurgitation is not visualized.  11. Mildly elevated pulmonary artery systolic pressure.  12. A pacer wire is visualized.  13. The inferior vena cava is normal in size with greater than 50%  respiratory variability, suggesting right atrial pressure of 3 mmHg.   EKG:  02/22/21: Afib, rate 85, iRBBB 01/01/2021: Atrial fibrillation, rate 61, incomplete right bundle branch block 08/17/2020: atrial fib, rate 97 incomplete RBBB 06/21/2019: atrial fibrillation, occasional ventricular paced complexes, rate 91  Recent Labs: 08/17/2020: TSH 1.840 08/28/2020: ALT 7 01/10/2021: BUN 18; Creatinine, Ser 1.06; Potassium 4.7; Sodium 140 01/23/2021: Hemoglobin 7.9; Platelet Count 193  Recent Lipid Panel    Component Value Date/Time   TRIG 95 02/14/2019 0326    Physical Exam:    VS:  BP (!) 82/58    Pulse 72    Ht 5' 4"  (1.626 m)    Wt 138 lb (62.6 kg)    SpO2 99%    BMI 23.69 kg/m     Wt Readings from Last 3 Encounters:  02/22/21 138 lb (62.6 kg)  01/23/21 140 lb 9.6 oz (63.8 kg)  01/17/21 136 lb (61.7 kg)    BP 90/60 on recheck GEN:  in no acute distress HEENT: Normal NECK: No JVD CARDIAC: Irregular, normal rate, no murmurs RESPIRATORY:  Clear to auscultation without rales, wheezing or rhonchi  ABDOMEN: Soft, non-tender MUSCULOSKELETAL:  No edema SKIN: Warm and dry NEUROLOGIC:  Alert and oriented x 3 PSYCHIATRIC:  Normal affect   ASSESSMENT:    1. Persistent atrial fibrillation ([place])   2. Hypotension, unspecified hypotension type   3. Anemia, unspecified type     PLAN:    Atrial fibrillation: Follows with EP.  Echo 02/08/2019 showed EF 50-55% -Continue metoprolol, will reduce  dose to 25 mg twice daily given soft BP -Continue Xarelto -Scheduled for cardioversion on 12/6, but she was in a paced rhythm on presentation.  She had been loaded with amiodarone and converted.  Continue amiodarone 200 mg daily.  She is back in A. fib in clinic today, rate controlled.  We will continue amiodarone  Hypotension: BP 82/58 in clinic initially, on my recheck was 90/60.  Denies any lightheadedness, syncope, dyspnea, chest pain, bleeding, fevers, chills.  Does report she feels fatigued.  Reduce metoprolol dose to 25 mg twice daily.  Check CBC, CMET, TSH.  Recommended monitoring home BP once daily or if having any lightheadedness.  If SBP less than 90 and having symptoms, recommend ED evalaution.  Dyspnea: Reports dyspnea with minimal exertion.  Appears euvolemic on exam.  TTE 09/19/2020 showed EF 50- 79%, grade 3 diastolic dysfunction, normal RV function, mild to moderate TR, 6 mild aortic dilatation measuring 42 mm, RVSP 36.  Anemia likely contributing, follows with hematology, underwent bone marrow biopsy and found to have myelodysplastic syndrome.  She has been undergoing PRBC transfusions  Anemia: Due to myelodysplastic syndrome.  Follows with hematology, has been receiving PRBC transfusions   SSS: s/p PPM: Follows with EP   RTC in 4 to 6 weeks       Medication Adjustments/Labs and Tests Ordered: Current medicines are reviewed at length with the patient today.  Concerns regarding medicines are outlined above.  Orders Placed This Encounter  Procedures   Comprehensive metabolic panel   CBC   TSH   EKG 12-Lead     Meds ordered this encounter  Medications   metoprolol tartrate (LOPRESSOR) 25 MG tablet    Sig: Take 1 tablet (25 mg total) by mouth 2 (two) times daily.    Dispense:  90 tablet    Refill:  1    NEW DOSE, D/C 100 MG RX     Patient Instructions  Medication Instructions:  Decrease Metoprolol to 25 mg twice daily   *If you need a refill on your cardiac  medications before your next appointment, please call your pharmacy*   Lab Work: CMET, CBC, TSH today   If you have labs (blood work) drawn today and your tests are completely normal, you will receive your results only by: [place] (if you have MyChart) OR A paper copy in the mail If you have any lab test that is abnormal or we need to change your treatment, we will call you to review the results.   Follow-Up: At [facility], you and your health needs are our priority.  As part of our continuing mission to provide you with exceptional heart care, we have created designated Provider Care Teams.  These Care Teams include your primary Cardiologist (physician) and Advanced Practice Providers (APPs -  Physician Assistants and Nurse Practitioners) who all work together to provide you with the care you need, when you need it.  We recommend signing up for the patient portal called "MyChart".  Sign up information is provided on this After Visit Summary.  MyChart is used to connect with patients for Virtual Visits (Telemedicine).  Patients are able to view lab/test results, encounter notes, upcoming appointments, etc.  Non-urgent messages can be sent to your provider as well.   To learn more about what you can do with MyChart, go to [email or web address].    Your next appointment:   4-6 week(s)  The format for your next appointment:   In Person  Provider:   Sande Rives, PA-C, Caron Presume, PA-C, Jory Sims, DNP, ANP, or Almyra Deforest, PA-C    Then, Donato Heinz, MD will plan to see you again in 3 month(s).    Other Instructions Use BP cuff (upper arm, OMRON) take BP once daily, call with BP readings in 1 week. Check BP if lightheaded as well.       Signed, Donato Heinz, MD  02/22/2021 10:59 AM    [facility]

## 2021-02-22 ENCOUNTER — Ambulatory Visit: Payer: Medicare Other | Admitting: Cardiology

## 2021-02-22 ENCOUNTER — Other Ambulatory Visit: Payer: Self-pay

## 2021-02-22 ENCOUNTER — Encounter: Payer: Self-pay | Admitting: Cardiology

## 2021-02-22 VITALS — BP 82/58 | HR 72 | Ht 64.0 in | Wt 138.0 lb

## 2021-02-22 DIAGNOSIS — I4819 Other persistent atrial fibrillation: Secondary | ICD-10-CM

## 2021-02-22 DIAGNOSIS — I959 Hypotension, unspecified: Secondary | ICD-10-CM

## 2021-02-22 DIAGNOSIS — D649 Anemia, unspecified: Secondary | ICD-10-CM | POA: Diagnosis not present

## 2021-02-22 MED ORDER — METOPROLOL TARTRATE 25 MG PO TABS: 25.0000 mg | ORAL_TABLET | Freq: Two times a day (BID) | ORAL | 1 refills | Status: DC

## 2021-02-22 NOTE — Patient Instructions (Signed)
Medication Instructions:  Decrease Metoprolol to 25 mg twice daily   *If you need a refill on your cardiac medications before your next appointment, please call your pharmacy*   Lab Work: CMET, CBC, TSH today   If you have labs (blood work) drawn today and your tests are completely normal, you will receive your results only by: [place] (if you have MyChart) OR A paper copy in the mail If you have any lab test that is abnormal or we need to change your treatment, we will call you to review the results.   Follow-Up: At [facility], you and your health needs are our priority.  As part of our continuing mission to provide you with exceptional heart care, we have created designated Provider Care Teams.  These Care Teams include your primary Cardiologist (physician) and Advanced Practice Providers (APPs -  Physician Assistants and Nurse Practitioners) who all work together to provide you with the care you need, when you need it.  We recommend signing up for the patient portal called "MyChart".  Sign up information is provided on this After Visit Summary.  MyChart is used to connect with patients for Virtual Visits (Telemedicine).  Patients are able to view lab/test results, encounter notes, upcoming appointments, etc.  Non-urgent messages can be sent to your provider as well.   To learn more about what you can do with MyChart, go to [email or web address].    Your next appointment:   4-6 week(s)  The format for your next appointment:   In Person  Provider:   Sande Rives, PA-C, Caron Presume, PA-C, Jory Sims, DNP, ANP, or Almyra Deforest, PA-C    Then, Donato Heinz, MD will plan to see you again in 3 month(s).    Other Instructions Use BP cuff (upper arm, OMRON) take BP once daily, call with BP readings in 1 week. Check BP if lightheaded as well.

## 2021-02-23 LAB — COMPREHENSIVE METABOLIC PANEL
ALT: 12 IU/L (ref 0–32)
AST: 19 IU/L (ref 0–40)
Albumin/Globulin Ratio: 2.1 (ref 1.2–2.2)
Albumin: 4.1 g/dL (ref 3.6–4.6)
Alkaline Phosphatase: 123 IU/L — ABNORMAL HIGH (ref 44–121)
BUN/Creatinine Ratio: 22 (ref 12–28)
BUN: 25 mg/dL (ref 8–27)
Bilirubin Total: 0.5 mg/dL (ref 0.0–1.2)
CO2: 21 mmol/L (ref 20–29)
Calcium: 9 mg/dL (ref 8.7–10.3)
Chloride: 106 mmol/L (ref 96–106)
Creatinine, Ser: 1.14 mg/dL — ABNORMAL HIGH (ref 0.57–1.00)
Globulin, Total: 2 g/dL (ref 1.5–4.5)
Glucose: 111 mg/dL — ABNORMAL HIGH (ref 70–99)
Potassium: 5 mmol/L (ref 3.5–5.2)
Sodium: 139 mmol/L (ref 134–144)
Total Protein: 6.1 g/dL (ref 6.0–8.5)
eGFR: 46 mL/min/{1.73_m2} — ABNORMAL LOW (ref >59)

## 2021-02-23 LAB — CBC
Hematocrit: 27.3 % — ABNORMAL LOW (ref 34.0–46.6)
Hemoglobin: 9.7 g/dL — ABNORMAL LOW (ref 11.1–15.9)
MCH: 34.3 pg — ABNORMAL HIGH (ref 26.6–33.0)
MCHC: 35.5 g/dL (ref 31.5–35.7)
MCV: 97 fL (ref 79–97)
Platelets: 214 10*3/uL (ref 150–450)
RBC: 2.83 x10E6/uL — ABNORMAL LOW (ref 3.77–5.28)
RDW: 19.7 % — ABNORMAL HIGH (ref 11.7–15.4)
WBC: 6.1 10*3/uL (ref 3.4–10.8)

## 2021-02-23 LAB — TSH: TSH: 1.83 u[IU]/mL (ref 0.450–4.500)

## 2021-02-25 ENCOUNTER — Ambulatory Visit (INDEPENDENT_AMBULATORY_CARE_PROVIDER_SITE_OTHER): Payer: Medicare Other

## 2021-02-25 DIAGNOSIS — I495 Sick sinus syndrome: Secondary | ICD-10-CM | POA: Diagnosis not present

## 2021-02-26 LAB — CUP PACEART REMOTE DEVICE CHECK
Battery Remaining Longevity: 55 mo
Battery Voltage: 2.98 V
Brady Statistic AP VP Percent: 1.42 %
Brady Statistic AP VS Percent: 92.84 %
Brady Statistic AS VP Percent: 2.59 %
Brady Statistic AS VS Percent: 3.14 %
Brady Statistic RA Percent Paced: 93.29 %
Brady Statistic RV Percent Paced: 3.88 %
Date Time Interrogation Session: 20230116160441
Implantable Lead Implant Date: 20180115
Implantable Lead Implant Date: 20180115
Implantable Lead Location: 753859
Implantable Lead Location: 753860
Implantable Lead Model: 5076
Implantable Lead Model: 5076
Implantable Pulse Generator Implant Date: 20180115
Lead Channel Impedance Value: 342 Ohm
Lead Channel Impedance Value: 399 Ohm
Lead Channel Impedance Value: 437 Ohm
Lead Channel Impedance Value: 494 Ohm
Lead Channel Pacing Threshold Amplitude: 0.625 V
Lead Channel Pacing Threshold Amplitude: 0.875 V
Lead Channel Pacing Threshold Pulse Width: 0.4 ms
Lead Channel Pacing Threshold Pulse Width: 0.4 ms
Lead Channel Sensing Intrinsic Amplitude: 0.625 mV
Lead Channel Sensing Intrinsic Amplitude: 0.625 mV
Lead Channel Sensing Intrinsic Amplitude: 11.875 mV
Lead Channel Sensing Intrinsic Amplitude: 11.875 mV
Lead Channel Setting Pacing Amplitude: 2 V
Lead Channel Setting Pacing Amplitude: 2.5 V
Lead Channel Setting Pacing Pulse Width: 0.4 ms
Lead Channel Setting Sensing Sensitivity: 2 mV

## 2021-03-08 NOTE — Progress Notes (Signed)
Remote pacemaker transmission.   

## 2021-03-25 ENCOUNTER — Other Ambulatory Visit: Payer: Self-pay

## 2021-03-25 ENCOUNTER — Other Ambulatory Visit: Payer: Self-pay | Admitting: Internal Medicine

## 2021-03-25 ENCOUNTER — Ambulatory Visit: Payer: Medicare Other | Admitting: Nurse Practitioner

## 2021-03-25 ENCOUNTER — Encounter: Payer: Self-pay | Admitting: Physician Assistant

## 2021-03-25 VITALS — BP 90/59 | HR 92 | Ht 64.0 in | Wt 138.0 lb

## 2021-03-25 DIAGNOSIS — I4819 Other persistent atrial fibrillation: Secondary | ICD-10-CM

## 2021-03-25 DIAGNOSIS — Z950 Presence of cardiac pacemaker: Secondary | ICD-10-CM | POA: Diagnosis not present

## 2021-03-25 DIAGNOSIS — I495 Sick sinus syndrome: Secondary | ICD-10-CM

## 2021-03-25 DIAGNOSIS — D649 Anemia, unspecified: Secondary | ICD-10-CM

## 2021-03-25 DIAGNOSIS — I5032 Chronic diastolic (congestive) heart failure: Secondary | ICD-10-CM | POA: Diagnosis not present

## 2021-03-25 DIAGNOSIS — D469 Myelodysplastic syndrome, unspecified: Secondary | ICD-10-CM

## 2021-03-25 DIAGNOSIS — I959 Hypotension, unspecified: Secondary | ICD-10-CM

## 2021-03-25 NOTE — Telephone Encounter (Signed)
Prescription refill request for Xarelto received.  Indication: Afib  Last office visit: 03/25/21 ([place])  Weight: 62.6kg Age: 88 Scr: 1.14 (02/22/21)  CrCl: 33.60ml/min  Appropriate dose and refill sent to requested pharmacy.

## 2021-03-25 NOTE — Progress Notes (Signed)
Office Visit    Patient Name: Megan Hester Date of Encounter: 03/25/2021  Primary Care Provider:  [name], MD Primary Cardiologist:  Donato Heinz, MD  Chief Complaint    88 year old female with a history of persistent atrial fibrillation on Xarelto (CHA2DS2VASc =5), sick sinus syndrome s/p PPM, hypertension, small bowel obstruction s/p resection, type 2 diabetes, myelodysplastic syndrome, breast cancer, and arthritis who presents for follow-up related to atrial fibrillation.  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   Past Surgical History:  Procedure Laterality Date   APPLICATION OF WOUND VAC Bilateral 02/01/2019   Procedure: APPLICATION OF WOUND VAC;  Surgeon: [name], MD;  Location: [facility];  Service: General;  Laterality: Bilateral;   BREAST BIOPSY Right    BREAST LUMPECTOMY Right 1999   RADIATION   CARDIAC CATHETERIZATION  02/05/1999   HYPERDYNAMIC LEFT VENTRICLE WITH EF OF 75-80%   CARDIOVERSION N/A 01/22/2015   Procedure: CARDIOVERSION;  Surgeon: [name], MD;  Location: [facility];  Service: Cardiovascular;  Laterality: N/A;   CARDIOVERSION N/A 02/22/2015   Procedure: CARDIOVERSION;  Surgeon: [name], MD;  Location: [facility];  Service: Cardiovascular;  Laterality: N/A;   DILATION AND CURETTAGE OF UTERUS     ENUCLEATION Right 2012   EP IMPLANTABLE DEVICE N/A 02/25/2016   Procedure: Pacemaker Implant;  Surgeon: [name], MD;  Location: [facility];  Service: Cardiovascular;  Laterality: N/A;   INSERT / REPLACE / REMOVE PACEMAKER      LAPAROSCOPIC SMALL BOWEL RESECTION N/A 02/01/2019   Procedure: LAPAROSCOPIC SMALL BOWEL RESECTION WITH TAP BLOCK;  Surgeon: [name], MD;  [facility];  Service: General;  Laterality: N/A;   LAPAROSCOPY N/A 02/01/2019   Procedure: LAPAROSCOPY DIAGNOSTIC;  Surgeon: [name], MD;  [facility];  Service: General;  Laterality: N/A;   LYSIS OF ADHESION N/A 02/01/2019   Procedure: LYSIS OF ADHESION;  Surgeon: [name], MD;  [facility];  Service: General;  Laterality: N/A;   TONSILLECTOMY      Allergies  Allergies  Allergen Reactions   Shellfish Allergy Hives and Swelling    Shrimp and lobster only   Atropine     Causes AFIB   Codeine     Leg pain   Doxycycline Other (See Comments)    unknown   Erythromycin Other (See Comments)   Fenoprofen Calcium Swelling   Iodinated Contrast Media Hives and Other (See Comments)   Isopto Hyoscine [Scopolamine]     Causes AFIB   Levaquin [Levofloxacin]     Rash & causes AFIB   Naproxen Other (See Comments)   Other     Other reaction(s): Bleeding (intolerance) Nutrasweet  Naphon - swelling   Oxycodone     Depression - pt tolerates as needed    Penicillins     Has patient had a PCN reaction causing immediate rash, facial/tongue/throat swelling, SOB or lightheadedness with hypotension: unknown Has patient had a PCN reaction causing severe rash involving mucus membranes or skin necrosis: unknown Has  patient had a PCN reaction that required hospitalization unknown Has patient had a PCN reaction occurring within the last 10 years: childhood If all of the above answers are "NO", then may proceed with Cephalosporin use. unknown   Sulfa Drugs Cross Reactors     Ankle swelling    Warfarin     Other reaction(s): Bleeding (intolerance) From eyes   Ceclor [Cefaclor] Hives   Tape Rash    Use paper tape    History of Present Illness    88 year old female with the above past medical history including persistent atrial  fibrillation on Xarelto (CHA2DS2VASc =5), sick sinus syndrome s/p PPM, hypertension, small bowel obstruction s/p resection, type 2 diabetes, myelodysplastic syndrome (follows with hematology), breast cancer, and arthritis.  He has a history of sick sinus syndrome s/p PPM. Follows with EP.  Low risk Myoview in 2016. H/o atrial fibrillation, with recurrence following surgical resection of small bowel obstruction in 2020.  At that time she was initially treated with amiodarone however, this was later discontinued due to elevated LFTs.  She was started on digoxin and subsequently flecainide which she did not tolerate due to lower extremity edema.  Flecainide was subsequently discontinued. Diltiazem was discontinued in the setting of hypotension. Echocardiogram in August 2022 showed EF 50 to 55%, grade 3 DD, mild to moderate TR, mild aortic valve sclerosis, no evidence of stenosis mild LAE, moderate RAE, normal RV systolic function, mildly elevated pulmonary artery systolic pressure, 16.0 mmHg.  She was noted to be in atrial fibrillation at a follow-up appointment with EP in November 2022. Though Dr. Rayann Heman previously recommended not pursuing restoration of sinus rhythm, given increased fatigue, she was reloaded with amiodarone with plans for DCCV, however, she converted to normal sinus rhythm prior to DCCV. She was last seen in the clinic on February 22, 2021 and was noted to be back in atrial fibrillation, rate controlled. Additionally, she was hypotensive. She did report increased fatigue.  Labs were stable at that time including CMET, TSH and CBC. She has a history of anemia in the setting of myelodysplastic syndrome for which she follows with hematology and receives periodic blood transfusions. Metoprolol was reduced at her last visit, though it was thought that her chronic anemia was likely contributing to her fatigue.    She presents today for follow-up accompanied by her daughter-in-law who is very active in  her care.  Since her last visit she has been stable overall from a cardiac standpoint.  Continue to have some low blood pressures though her SBP has mainly been in the low 100s, with occasional readings in the 80s-90s. She denies falls, dizziness, presyncope, syncope. She denies palpitations or dyspnea.  She has mild dependent lower extremity edema, has become more noticeable in the setting of increased sedentary lifestyle. She does have an appointment scheduled with hematology on 03/27/2021 and is hoping to receive a blood transfusion.  Her daughter-in-law thinks that her anemia is greatly contributing to her symptoms of fatigue.  Other than occasional low blood pressure, patient and daughter in law deny any concerns or complaints today.  Home Medications    Current Outpatient Medications  Medication Sig Dispense Refill   amiodarone (PACERONE) 200 MG tablet Take 200 mg by mouth daily.     cetirizine (ZYRTEC) 10 MG tablet Take 10 mg by mouth daily with supper.     Cholecalciferol (VITAMIN D) 1000 UNITS capsule Take 1,000 Units by mouth daily with supper.     clotrimazole-betamethasone (LOTRISONE) cream Apply  1 application topically 2 (two) times daily. Female parts     Glucosamine HCl POWD by Does not apply route.     Hypromellose (ARTIFICIAL TEARS OP) Place 1 drop into the left eye daily as needed (dry eye).     Melatonin 10 MG TABS Take 10 mg by mouth 2 (two) times daily. Supper and bedime     metoprolol tartrate (LOPRESSOR) 25 MG tablet Take 1 tablet (25 mg total) by mouth 2 (two) times daily. 90 tablet 1   Multiple Vitamins-Minerals (MULTIVITAMIN WITH MINERALS) tablet Take 1 tablet by mouth 2 (two) times daily.     Multiple Vitamins-Minerals (PRESERVISION/LUTEIN PO) Take 1 capsule by mouth 2 (two) times daily.      Polyethyl Glycol-Propyl Glycol (SYSTANE OP) Place 1 drop into the right eye daily as needed (dry eye). gel     polyethylene glycol (MIRALAX / GLYCOLAX) packet Take 17 g by mouth every  evening.     QUEtiapine (SEROQUEL) 25 MG tablet Take 25 mg by mouth at bedtime.     sertraline (ZOLOFT) 100 MG tablet Take 100 mg by mouth at bedtime.     XARELTO 15 MG TABS tablet TAKE 1 TABLET ONCE DAILY WITH SUPPER. 30 tablet 5   No current facility-administered medications for this visit.     Review of Systems    She denies chest pain, palpitations, dyspnea, pnd, orthopnea, n, v, dizziness, syncope, weight gain, or early satiety. All other systems reviewed and are otherwise negative except as noted above.   Physical Exam    VS:  BP (!) 90/59    Pulse 92    Ht 5\' 4"  (1.626 m)    Wt 138 lb (62.6 kg)    SpO2 97%    BMI 23.69 kg/m   GEN: Well nourished, well developed, in no acute distress. HEENT: normal. Neck: Supple, no JVD, carotid bruits, or masses. Cardiac: IRR, no murmurs, rubs, or gallops. No clubbing, cyanosis, edema.  Radials/DP/PT 2+ and equal bilaterally.  Respiratory:  Respirations regular and unlabored, clear to auscultation bilaterally. GI: Soft, nontender, nondistended, BS + x 4. MS: no deformity or atrophy. Skin: warm and dry, no rash. Neuro:  Strength and sensation are intact. Psych: Normal affect.  Accessory Clinical Findings    ECG personally reviewed by me today - Atrial fibrillation, RBBB, 87 bpm - no acute changes.  Lab Results  Component Value Date   WBC 6.1 02/22/2021   HGB 9.7 (L) 02/22/2021   HCT 27.3 (L) 02/22/2021   MCV 97 02/22/2021   PLT 214 02/22/2021   Lab Results  Component Value Date   CREATININE 1.14 (H) 02/22/2021   BUN 25 02/22/2021   NA 139 02/22/2021   K 5.0 02/22/2021   CL 106 02/22/2021   CO2 21 02/22/2021   Lab Results  Component Value Date   ALT 12 02/22/2021   AST 19 02/22/2021   ALKPHOS 123 (H) 02/22/2021   BILITOT 0.5 02/22/2021   Lab Results  Component Value Date   TRIG 95 02/14/2019    Lab Results  Component Value Date   HGBA1C 5.1 02/02/2019    Assessment & Plan    1. Persistent atrial fibrillation:  Recently re-loaded with amiodarone in preparation for DCCV per EP though she converted to normal sinus rhythm and DCCV was cancelled.  She was in noted to be in A.Fib at recent follow-up on 02/22/2021. Labs were stable at the time, metoprolol was reduced to due to hypotension. EKG today shows atrial  fibrillation, rate controlled, 87 bpm. She remains on amiodarone 200 mg daily and metoprolol tartrate 25 mg twice daily. She does report some fatigue, however, she and her family think that her chronic anemia could be contributing to this.  She denies bleeding on Xarelto occasional minor nosebleed.  She remains hypotensive, though it is unlikely that her medication is contributing significantly to her low blood pressure. Through discussion and shared decision making, patient and family would prefer to continue current medication regimen and see if addressing her chronic anemia improves her symptoms.  We will plan close follow-up.  If she remains in atrial fibrillation and fatigue persists despite treatment of anemia, could consider DCCV. If her symptoms are improved, could also consider rate control strategy.  If rate control strategy is preferred, consider discontinuation of amiodarone given history of elevated LFTs on amiodarone therapy. Will discuss with Dr. Gardiner Rhyme.  For now, continue amiodarone 200 mg daily, metoprolol 25 mg twice daily, and Xarelto.  2. Chronic diastolic heart failure:  Echocardiogram in August 2022 showed EF 50 to 55%, grade 3 DD, mild to moderate TR, mild aortic valve sclerosis, no evidence of stenosis mild LAE, moderate RAE, normal RV systolic function, mildly elevated pulmonary artery systolic pressure, 41.3 mmHg. Euvolemic and well compensated on exam, no indication for loop diuretic at this time.  Continue metoprolol.  3. Sick sinus syndrome: S/p PPM.  Most recent interrogation on 02/25/2021 showed normal device function.  Routine follow-up with EP recommended.  4. Chronic  anemia/myelodysplastic syndrome: Hemoglobin was 9.7 on 02/22/2021.  She has follow-up scheduled with hematology oncology on 03/27/2021.  Anemia is likely contributing to her chronic fatigue.  Continue to monitor.   5. Hypotension: She remains hypotensive at times, though BP overall stable with home readings SBP low 100s, occasional readings with SBP 70s-80s. Continue current medications as above  6. Disposition: Follow-up in 4-6 weeks.   Lenna Sciara, NP 03/25/2021, 11:58 AM

## 2021-03-25 NOTE — Patient Instructions (Signed)
Medication Instructions:  Your physician recommends that you continue on your current medications as directed. Please refer to the Current Medication list given to you today.   *If you need a refill on your cardiac medications before your next appointment, please call your pharmacy*   Lab Work: NONE ordered at this time of appointment    Testing/Procedures: NONE ordered at this time of appointment     Follow-Up: At [facility], you and your health needs are our priority.  As part of our continuing mission to provide you with exceptional heart care, we have created designated Provider Care Teams.  These Care Teams include your primary Cardiologist (physician) and Advanced Practice Providers (APPs -  Physician Assistants and Nurse Practitioners) who all work together to provide you with the care you need, when you need it.  We recommend signing up for the patient portal called "MyChart".  Sign up information is provided on this After Visit Summary.  MyChart is used to connect with patients for Virtual Visits (Telemedicine).  Patients are able to view lab/test results, encounter notes, upcoming appointments, etc.  Non-urgent messages can be sent to your provider as well.   To learn more about what you can do with MyChart, go to [email or web address].    Your next appointment:   -4-6 week(s)  The format for your next appointment:   In Person  Provider:   Almyra Deforest, PA-C or Diona Browner, NP        Other Instructions Keep a log of Blood Pressure and heart rate as discussed in office

## 2021-03-26 ENCOUNTER — Other Ambulatory Visit: Payer: Self-pay

## 2021-03-26 DIAGNOSIS — D649 Anemia, unspecified: Secondary | ICD-10-CM

## 2021-03-27 ENCOUNTER — Inpatient Hospital Stay: Payer: Medicare Other

## 2021-03-27 ENCOUNTER — Inpatient Hospital Stay: Payer: Medicare Other | Attending: Internal Medicine

## 2021-03-27 ENCOUNTER — Inpatient Hospital Stay: Payer: Medicare Other | Admitting: Internal Medicine

## 2021-03-27 ENCOUNTER — Other Ambulatory Visit: Payer: Self-pay

## 2021-03-27 VITALS — BP 82/52 | HR 69 | Temp 97.2°F | Resp 18 | Ht 64.0 in | Wt 140.8 lb

## 2021-03-27 DIAGNOSIS — D4620 Refractory anemia with excess of blasts, unspecified: Secondary | ICD-10-CM

## 2021-03-27 DIAGNOSIS — D62 Acute posthemorrhagic anemia: Secondary | ICD-10-CM

## 2021-03-27 DIAGNOSIS — D461 Refractory anemia with ring sideroblasts: Secondary | ICD-10-CM | POA: Diagnosis not present

## 2021-03-27 DIAGNOSIS — D649 Anemia, unspecified: Secondary | ICD-10-CM

## 2021-03-27 DIAGNOSIS — D46Z Other myelodysplastic syndromes: Secondary | ICD-10-CM

## 2021-03-27 DIAGNOSIS — E538 Deficiency of other specified B group vitamins: Secondary | ICD-10-CM | POA: Insufficient documentation

## 2021-03-27 LAB — CBC WITH DIFFERENTIAL (CANCER CENTER ONLY)
Abs Immature Granulocytes: 0.02 10*3/uL (ref 0.00–0.07)
Basophils Absolute: 0 10*3/uL (ref 0.0–0.1)
Basophils Relative: 1 %
Eosinophils Absolute: 0.2 10*3/uL (ref 0.0–0.5)
Eosinophils Relative: 3 %
HCT: 23.5 % — ABNORMAL LOW (ref 36.0–46.0)
Hemoglobin: 7.8 g/dL — ABNORMAL LOW (ref 12.0–15.0)
Immature Granulocytes: 1 %
Lymphocytes Relative: 31 %
Lymphs Abs: 1.4 10*3/uL (ref 0.7–4.0)
MCH: 35.5 pg — ABNORMAL HIGH (ref 26.0–34.0)
MCHC: 33.2 g/dL (ref 30.0–36.0)
MCV: 106.8 fL — ABNORMAL HIGH (ref 80.0–100.0)
Monocytes Absolute: 0.4 10*3/uL (ref 0.1–1.0)
Monocytes Relative: 8 %
Neutro Abs: 2.5 10*3/uL (ref 1.7–7.7)
Neutrophils Relative %: 56 %
Platelet Count: 217 10*3/uL (ref 150–400)
RBC: 2.2 MIL/uL — ABNORMAL LOW (ref 3.87–5.11)
RDW: 22.1 % — ABNORMAL HIGH (ref 11.5–15.5)
WBC Count: 4.4 10*3/uL (ref 4.0–10.5)
nRBC: 0.7 % — ABNORMAL HIGH (ref 0.0–0.2)

## 2021-03-27 LAB — FERRITIN: Ferritin: 523 ng/mL — ABNORMAL HIGH (ref 11–307)

## 2021-03-27 LAB — IRON AND IRON BINDING CAPACITY (CC-WL,HP ONLY)
Iron: 210 ug/dL — ABNORMAL HIGH (ref 28–170)
Saturation Ratios: 90 % — ABNORMAL HIGH (ref 10.4–31.8)
TIBC: 234 ug/dL — ABNORMAL LOW (ref 250–450)
UIBC: 24 ug/dL — ABNORMAL LOW (ref 148–442)

## 2021-03-27 LAB — PREPARE RBC (CROSSMATCH)

## 2021-03-27 NOTE — Progress Notes (Signed)
[place] Telephone:([phone]   [phone]  OFFICE PROGRESS NOTE  [name], MD [place] [address]  DIAGNOSIS:  Low-grade myelodysplastic syndrome with ring sideroblasts.  There was also an element of vitamin B12 deficiency after small bowel resection for an obstruction in December 2020.  PRIOR THERAPY:None  CURRENT THERAPY:  1) Vitamin B12 injection initially weekly then every 2 weeks and then monthly. 2) supportive care with transfusion on as-needed basis  INTERVAL HISTORY: Megan Hester Bare 88 y.o. female returns to the clinic today for follow-up visit accompanied by her daughter-in-law.  The patient continues to complain of increasing fatigue and weakness.  Her blood pressure has been very low recently and she is followed by cardiology and the adjusted her medication including metoprolol down to the lowest dose.  She is also on amiodarone.  She denied having any current chest pain, shortness of breath, cough or hemoptysis.  She denied having any fever or chills.  She has no nausea, vomiting, diarrhea or constipation.  She has no headache or visual changes.  She is here today for evaluation and repeat blood work.  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  MEDICATIONS:   Current Outpatient Medications  Medication Sig Dispense Refill   amiodarone (PACERONE) 200 MG tablet Take 200 mg by mouth daily.     cetirizine (ZYRTEC) 10 MG tablet Take 10 mg by mouth daily with supper.     Cholecalciferol (VITAMIN D) 1000 UNITS capsule Take 1,000 Units by mouth daily with supper.     clotrimazole-betamethasone (LOTRISONE) cream Apply 1 application topically 2 (two) times daily. Female parts     Glucosamine HCl POWD by Does not apply route.     Hypromellose (ARTIFICIAL TEARS OP) Place 1 drop into the left eye daily as needed (dry eye).     Melatonin 10 MG TABS Take 10 mg by mouth 2 (two) times daily. Supper and bedime     metoprolol tartrate (LOPRESSOR) 25 MG tablet Take 1 tablet (25 mg total) by mouth 2 (two) times daily. 90 tablet 1   Multiple Vitamins-Minerals (MULTIVITAMIN WITH MINERALS) tablet Take 1 tablet by mouth 2 (two) times daily.     Multiple Vitamins-Minerals (PRESERVISION/LUTEIN PO) Take 1 capsule by mouth 2 (two) times daily.      Polyethyl Glycol-Propyl Glycol (SYSTANE OP) Place 1 drop into the right eye daily as needed (dry eye). gel     polyethylene glycol (MIRALAX / GLYCOLAX) packet Take 17 g by mouth every evening.     QUEtiapine (SEROQUEL) 25 MG tablet Take 25 mg by mouth at bedtime.     sertraline (ZOLOFT) 100 MG tablet Take 100 mg by mouth at bedtime.     XARELTO 15 MG TABS tablet TAKE ONE TABLET BY MOUTH ONCE DAILY WITH SUPPER 30 tablet  5   No current facility-administered medications for this visit.    SURGICAL HISTORY:  Past Surgical History:  Procedure Laterality Date   APPLICATION OF WOUND VAC Bilateral 02/01/2019   Procedure: APPLICATION OF WOUND VAC;  Surgeon: [name], MD;  [facility];  Service: General;  Laterality: Bilateral;   BREAST BIOPSY Right    BREAST LUMPECTOMY Right 1999   RADIATION   CARDIAC CATHETERIZATION  02/05/1999   HYPERDYNAMIC LEFT VENTRICLE WITH EF OF 75-80%   CARDIOVERSION N/A 01/22/2015   Procedure:  CARDIOVERSION;  Surgeon: [name], MD;  Location: [facility];  Service: Cardiovascular;  Laterality: N/A;   CARDIOVERSION N/A 02/22/2015   Procedure: CARDIOVERSION;  Surgeon: [name], MD;  Location: [facility];  Service: Cardiovascular;  Laterality: N/A;   DILATION AND CURETTAGE OF UTERUS     ENUCLEATION Right 2012   EP IMPLANTABLE DEVICE N/A 02/25/2016   Procedure: Pacemaker Implant;  Surgeon: [name], MD;  Location: [facility];  Service: Cardiovascular;  Laterality: N/A;   INSERT / REPLACE / REMOVE PACEMAKER     LAPAROSCOPIC SMALL BOWEL RESECTION N/A 02/01/2019   Procedure: LAPAROSCOPIC SMALL BOWEL RESECTION WITH TAP BLOCK;  Surgeon: [name], MD;  [facility];  Service: General;  Laterality: N/A;   LAPAROSCOPY N/A 02/01/2019   Procedure: LAPAROSCOPY DIAGNOSTIC;  Surgeon: [name], MD;  [facility];  Service: General;  Laterality: N/A;   LYSIS OF ADHESION N/A 02/01/2019   Procedure: LYSIS OF ADHESION;  Surgeon: [name], MD;  [facility];  Service: General;  Laterality: N/A;   TONSILLECTOMY      REVIEW OF SYSTEMS:  A comprehensive review of systems was negative except for: Constitutional: positive for fatigue   PHYSICAL EXAMINATION: General appearance: alert, cooperative, fatigued, and no distress Head: Normocephalic, without obvious abnormality, atraumatic Neck: no adenopathy, no JVD, supple, symmetrical, trachea midline, and thyroid not enlarged, symmetric, no tenderness/mass/nodules Lymph nodes: Cervical, supraclavicular, and axillary nodes normal. Resp: clear to auscultation bilaterally Back: symmetric, no curvature. ROM normal. No CVA tenderness. Cardio: regular rate and rhythm, S1, S2 normal, no murmur, click, rub or gallop GI: soft, non-tender; bowel sounds normal; no masses,  no organomegaly Extremities: extremities normal, atraumatic, no cyanosis or edema  ECOG PERFORMANCE STATUS: 1 - Symptomatic but completely  ambulatory  Blood pressure (!) 82/52, pulse 69, temperature (!) 97.2 F (36.2 C), temperature source Tympanic, resp. rate 18, height 5' 4" (1.626 m), weight 140 lb 12.8 oz (63.9 kg), SpO2 99 %.  LABORATORY DATA: Lab Results  Component Value Date   WBC 4.4 03/27/2021   HGB 7.8 (L) 03/27/2021   HCT 23.5 (L) 03/27/2021   MCV 106.8 (H) 03/27/2021   PLT 217 03/27/2021      Chemistry      Component Value Date/Time   NA 139 02/22/2021 1145   K 5.0 02/22/2021 1145   CL 106 02/22/2021 1145   CO2 21 02/22/2021 1145   BUN 25 02/22/2021 1145   CREATININE 1.14 (H) 02/22/2021 1145   CREATININE 1.02 (H) 08/28/2020 1123      Component Value Date/Time   CALCIUM 9.0 02/22/2021 1145   ALKPHOS 123 (H) 02/22/2021 1145   AST 19 02/22/2021 1145   AST 13 (L) 08/28/2020 1123   ALT 12 02/22/2021 1145   ALT 7 08/28/2020 1123   BILITOT 0.5 02/22/2021 1145   BILITOT 0.8 08/28/2020 1123       RADIOGRAPHIC STUDIES: CUP PACEART REMOTE DEVICE CHECK  Result  Date: 02/26/2021 Scheduled remote reviewed. Normal device function.  151 AF events, longest 6hrs, ventricular rates controlled Known PAF, burden 4.3%, Xarelto, Metoprolol, and Amiodarone prescribed Next remote 91 days. [place]   ASSESSMENT AND PLAN: This is a very pleasant 88 years old white female with recently diagnosed low-grade myelodysplastic syndrome in addition to questionable vitamin B12 deficiency.   The patient had a bone marrow biopsy and aspirate performed recently.   The cytogenetics showed no 5 q. deletion. I recommended for the patient to continue with supportive care. Because of the persistent anemia, I will arrange for the patient to receive 2 units of PRBCs transfusion. I will see her back for follow-up visit in 6 weeks for evaluation and repeat blood work including CBC, iron study and ferritin. For the hypotension, she was advised to continue her routine follow-up visit and monitoring by her cardiologist. The patient was advised to  call immediately if she has any other concerning symptoms in the interval. The patient voices understanding of current disease status and treatment options and is in agreement with the current care plan.  All questions were answered. The patient knows to call the clinic with any problems, questions or concerns. We can certainly see the patient much sooner if necessary.   Disclaimer: This note was dictated with voice recognition software. Similar sounding words can inadvertently be transcribed and may not be corrected upon review.

## 2021-03-28 ENCOUNTER — Inpatient Hospital Stay: Payer: Medicare Other

## 2021-03-28 DIAGNOSIS — D461 Refractory anemia with ring sideroblasts: Secondary | ICD-10-CM | POA: Diagnosis not present

## 2021-03-28 DIAGNOSIS — D649 Anemia, unspecified: Secondary | ICD-10-CM

## 2021-03-28 MED ORDER — ACETAMINOPHEN 325 MG PO TABS
650.0000 mg | ORAL_TABLET | Freq: Once | ORAL | Status: AC
  Administered 2021-03-28: 650 mg via ORAL
  Filled 2021-03-28: qty 2

## 2021-03-28 MED ORDER — DIPHENHYDRAMINE HCL 25 MG PO CAPS
25.0000 mg | ORAL_CAPSULE | Freq: Once | ORAL | Status: AC
  Administered 2021-03-28: 25 mg via ORAL
  Filled 2021-03-28: qty 1

## 2021-03-28 MED ORDER — SODIUM CHLORIDE 0.9% IV SOLUTION
250.0000 mL | Freq: Once | INTRAVENOUS | Status: AC
  Administered 2021-03-28: 250 mL via INTRAVENOUS

## 2021-03-28 NOTE — Patient Instructions (Signed)
Blood Transfusion, Adult A blood transfusion is a procedure in which you receive blood or a type of blood cell (blood component) through an IV. You may need a blood transfusion when your blood level is low. This may result from a bleeding disorder, illness, injury, or surgery. The blood may come from a donor. You may also be able to donate blood for yourself (autologous blood donation) before a planned surgery. The blood given in a transfusion is made up of different blood components. You may receive: Red blood cells. These carry oxygen to the cells in the body. Platelets. These help your blood to clot. Plasma. This is the liquid part of your blood. It carries proteins and other substances throughout the body. White blood cells. These help you fight infections. If you have hemophilia or another clotting disorder, you may also receive other types of blood products. Tell a health care provider about: Any blood disorders you have. Any previous reactions you have had during a blood transfusion. Any allergies you have. All medicines you are taking, including vitamins, herbs, eye drops, creams, and over-the-counter medicines. Any surgeries you have had. Any medical conditions you have, including any recent fever or cold symptoms. Whether you are pregnant or may be pregnant. What are the risks? Generally, this is a safe procedure. However, problems may occur. The most common problems include: A mild allergic reaction, such as red, swollen areas of skin (hives) and itching. Fever or chills. This may be the body's response to new blood cells received. This may occur during or up to 4 hours after the transfusion. More serious problems may include: Transfusion-associated circulatory overload (TACO), or too much fluid in the lungs. This may cause breathing problems. A serious allergic reaction, such as difficulty breathing or swelling around the face and lips. Transfusion-related acute lung injury  (TRALI), which causes breathing difficulty and low oxygen in the blood. This can occur within hours of the transfusion or several days later. Iron overload. This can happen after receiving many blood transfusions over a period of time. Infection or virus being transmitted. This is rare because donated blood is carefully tested before it is given. Hemolytic transfusion reaction. This is rare. It happens when your body's defense system (immune system)tries to attack the new blood cells. Symptoms may include fever, chills, nausea, low blood pressure, and low back or chest pain. Transfusion-associated graft-versus-host disease (TAGVHD). This is rare. It happens when donated cells attack your body's healthy tissues. What happens before the procedure? Medicines Ask your health care provider about: Changing or stopping your regular medicines. This is especially important if you are taking diabetes medicines or blood thinners. Taking medicines such as aspirin and ibuprofen. These medicines can thin your blood. Do not take these medicines unless your health care provider tells you to take them. Taking over-the-counter medicines, vitamins, herbs, and supplements. General instructions Follow instructions from your health care provider about eating and drinking restrictions. You will have a blood test to determine your blood type. This is necessary to know what kind of blood your body will accept and to match it to the donor blood. If you are going to have a planned surgery, you may be able to do an autologous blood donation. This may be done in case you need to have a transfusion. You will have your temperature, blood pressure, and pulse monitored before the transfusion. If you have had an allergic reaction to a transfusion in the past, you may be given medicine to help prevent   a reaction. This medicine may be given to you by mouth (orally) or through an IV. Set aside time for the blood transfusion. This  procedure generally takes 1-4 hours to complete. What happens during the procedure?  An IV will be inserted into one of your veins. The bag of donated blood will be attached to your IV. The blood will then enter through your vein. Your temperature, blood pressure, and pulse will be monitored regularly during the transfusion. This monitoring is done to detect early signs of a transfusion reaction. Tell your nurse right away if you have any of these symptoms during the transfusion: Shortness of breath or trouble breathing. Chest or back pain. Fever or chills. Hives or itching. If you have any signs or symptoms of a reaction, your transfusion will be stopped and you may be given medicine. When the transfusion is complete, your IV will be removed. Pressure may be applied to the IV site for a few minutes. A bandage (dressing)will be applied. The procedure may vary among health care providers and hospitals. What happens after the procedure? Your temperature, blood pressure, pulse, breathing rate, and blood oxygen level will be monitored until you leave the hospital or clinic. Your blood may be tested to see how you are responding to the transfusion. You may be warmed with fluids or blankets to maintain a normal body temperature. If you receive your blood transfusion in an outpatient setting, you will be told whom to contact to report any reactions. Where to find more information For more information on blood transfusions, visit the [employer]: redcross.org Summary A blood transfusion is a procedure in which you receive blood or a type of blood cell (blood component) through an IV. The blood you receive may come from a donor or be donated by yourself (autologous blood donation) before a planned surgery. The blood given in a transfusion is made up of different blood components. You may receive red blood cells, platelets, plasma, or white blood cells depending on the condition treated. Your  temperature, blood pressure, and pulse will be monitored before, during, and after the transfusion. After the transfusion, your blood may be tested to see how your body has responded. This information is not intended to replace advice given to you by your health care provider. Make sure you discuss any questions you have with your health care provider. Document Revised: 12/02/2018 Document Reviewed: 07/22/2018 Elsevier Patient Education  2022 Elsevier Inc.  

## 2021-03-29 LAB — BPAM RBC
Blood Product Expiration Date: 202302282359
Blood Product Expiration Date: 202302282359
ISSUE DATE / TIME: 202302160816
ISSUE DATE / TIME: 202302160816
Unit Type and Rh: 9500
Unit Type and Rh: 9500

## 2021-03-29 LAB — TYPE AND SCREEN
ABO/RH(D): O NEG
Antibody Screen: NEGATIVE
Unit division: 0
Unit division: 0

## 2021-03-30 LAB — ERYTHROPOIETIN: Erythropoietin: 104.6 m[IU]/mL — ABNORMAL HIGH (ref 2.6–18.5)

## 2021-05-08 ENCOUNTER — Other Ambulatory Visit: Payer: Self-pay | Admitting: *Deleted

## 2021-05-08 ENCOUNTER — Inpatient Hospital Stay: Payer: Medicare Other | Attending: Internal Medicine

## 2021-05-08 ENCOUNTER — Other Ambulatory Visit: Payer: Self-pay

## 2021-05-08 ENCOUNTER — Inpatient Hospital Stay: Payer: Medicare Other

## 2021-05-08 ENCOUNTER — Inpatient Hospital Stay: Payer: Medicare Other | Admitting: Internal Medicine

## 2021-05-08 VITALS — BP 97/55 | HR 65 | Temp 98.2°F | Resp 16 | Ht 64.0 in | Wt 139.6 lb

## 2021-05-08 DIAGNOSIS — D464 Refractory anemia, unspecified: Secondary | ICD-10-CM

## 2021-05-08 DIAGNOSIS — D461 Refractory anemia with ring sideroblasts: Secondary | ICD-10-CM | POA: Diagnosis present

## 2021-05-08 DIAGNOSIS — D649 Anemia, unspecified: Secondary | ICD-10-CM

## 2021-05-08 DIAGNOSIS — Z853 Personal history of malignant neoplasm of breast: Secondary | ICD-10-CM | POA: Insufficient documentation

## 2021-05-08 DIAGNOSIS — D62 Acute posthemorrhagic anemia: Secondary | ICD-10-CM

## 2021-05-08 DIAGNOSIS — E538 Deficiency of other specified B group vitamins: Secondary | ICD-10-CM | POA: Diagnosis not present

## 2021-05-08 LAB — CBC WITH DIFFERENTIAL (CANCER CENTER ONLY)
Abs Immature Granulocytes: 0.01 10*3/uL (ref 0.00–0.07)
Basophils Absolute: 0 10*3/uL (ref 0.0–0.1)
Basophils Relative: 1 %
Eosinophils Absolute: 0.2 10*3/uL (ref 0.0–0.5)
Eosinophils Relative: 3 %
HCT: 23.6 % — ABNORMAL LOW (ref 36.0–46.0)
Hemoglobin: 7.7 g/dL — ABNORMAL LOW (ref 12.0–15.0)
Immature Granulocytes: 0 %
Lymphocytes Relative: 29 %
Lymphs Abs: 1.4 10*3/uL (ref 0.7–4.0)
MCH: 34.5 pg — ABNORMAL HIGH (ref 26.0–34.0)
MCHC: 32.6 g/dL (ref 30.0–36.0)
MCV: 105.8 fL — ABNORMAL HIGH (ref 80.0–100.0)
Monocytes Absolute: 0.5 10*3/uL (ref 0.1–1.0)
Monocytes Relative: 10 %
Neutro Abs: 2.9 10*3/uL (ref 1.7–7.7)
Neutrophils Relative %: 57 %
Platelet Count: 193 10*3/uL (ref 150–400)
RBC: 2.23 MIL/uL — ABNORMAL LOW (ref 3.87–5.11)
RDW: 21.5 % — ABNORMAL HIGH (ref 11.5–15.5)
WBC Count: 5 10*3/uL (ref 4.0–10.5)
nRBC: 0.6 % — ABNORMAL HIGH (ref 0.0–0.2)

## 2021-05-08 LAB — IRON AND IRON BINDING CAPACITY (CC-WL,HP ONLY)
Iron: 64 ug/dL (ref 28–170)
Saturation Ratios: 29 % (ref 10.4–31.8)
TIBC: 223 ug/dL — ABNORMAL LOW (ref 250–450)
UIBC: 159 ug/dL

## 2021-05-08 LAB — SAMPLE TO BLOOD BANK

## 2021-05-08 LAB — PREPARE RBC (CROSSMATCH)

## 2021-05-08 LAB — FERRITIN: Ferritin: 720 ng/mL — ABNORMAL HIGH (ref 11–307)

## 2021-05-08 NOTE — Progress Notes (Signed)
Received verbal orders from Dr. Julien Nordmann for transfusion of 2 units PRBCs. Premeds Tylenol 600 mg PO; Benadryl 25 mg PO. ?Orders entered. Released Type & Screen and Prepare orders from sign and held. ?Awaiting confirmation from Blood Bank for these orders.  ?

## 2021-05-08 NOTE — Progress Notes (Signed)
?    [facility] ?[phone]   [phone] ? ?OFFICE PROGRESS NOTE ? ?[name], MD ?[address] ?[place] [address] ? ?DIAGNOSIS:  Low-grade myelodysplastic syndrome with ring sideroblasts.  There was also an element of vitamin B12 deficiency after small bowel resection for an obstruction in December 2020. ? ?PRIOR THERAPY:None ? ?CURRENT THERAPY:  ?1) Vitamin B12 injection initially weekly then every 2 weeks and then monthly. ?2) supportive care with transfusion on as-needed basis ? ?INTERVAL HISTORY: ?Megan Hester 88 y.o. female returns to the clinic today for follow-up visit accompanied by her daughter-in-law.  The patient is complaining of increasing fatigue and weakness as well as intermittent dizzy spells.  She denied having any current chest pain, shortness of breath, cough or hemoptysis.  She denied having any fever or chills.  She has no nausea, vomiting, diarrhea or constipation.  She is here today for evaluation and repeat blood work. ? ?MEDICAL HISTORY: ?Past Medical History:  ?Diagnosis Date  ? Arthritis   ? "all over my body" (02/25/2016)  ? Cancer of right breast ([place]) 1999  ? DCIS  ? GERD (gastroesophageal reflux disease)   ? hx; "when I was working"  ? Hypertension   ? Macular degeneration   ? Paroxysmal atrial fibrillation (HCC)   ? Presence of permanent cardiac pacemaker   ? Retinal hemorrhage   ? with recent denucleation of R eye  ? ? ?ALLERGIES:  is allergic to shellfish allergy, atropine, codeine, doxycycline, erythromycin, fenoprofen calcium, iodinated contrast media, isopto hyoscine [scopolamine], levaquin [levofloxacin], naproxen, other, oxycodone, penicillins, sulfa drugs cross reactors, warfarin, ceclor [cefaclor], and tape. ? ?MEDICATIONS:  ?Current Outpatient Medications  ?Medication Sig Dispense Refill  ? amiodarone (PACERONE) 200 MG tablet Take 200 mg by mouth daily.    ? cetirizine (ZYRTEC) 10 MG tablet Take 10 mg by mouth daily  with supper.    ? Cholecalciferol (VITAMIN D) 1000 UNITS capsule Take 1,000 Units by mouth daily with supper.    ? clotrimazole-betamethasone (LOTRISONE) cream Apply 1 application topically 2 (two) times daily. Female parts    ? Glucosamine HCl POWD by Does not apply route.    ? Hypromellose (ARTIFICIAL TEARS OP) Place 1 drop into the left eye daily as needed (dry eye).    ? Melatonin 10 MG TABS Take 10 mg by mouth 2 (two) times daily. Supper and bedime    ? metoprolol tartrate (LOPRESSOR) 25 MG tablet Take 1 tablet (25 mg total) by mouth 2 (two) times daily. 90 tablet 1  ? Multiple Vitamins-Minerals (MULTIVITAMIN WITH MINERALS) tablet Take 1 tablet by mouth 2 (two) times daily.    ? Multiple Vitamins-Minerals (PRESERVISION/LUTEIN PO) Take 1 capsule by mouth 2 (two) times daily.     ? Polyethyl Glycol-Propyl Glycol (SYSTANE OP) Place 1 drop into the right eye daily as needed (dry eye). gel    ? polyethylene glycol (MIRALAX / GLYCOLAX) packet Take 17 g by mouth every evening.    ? QUEtiapine (SEROQUEL) 25 MG tablet Take 25 mg by mouth at bedtime.    ? sertraline (ZOLOFT) 100 MG tablet Take 100 mg by mouth at bedtime.    ? XARELTO 15 MG TABS tablet TAKE ONE TABLET BY MOUTH ONCE DAILY WITH SUPPER 30 tablet 5  ? ?No current facility-administered medications for this visit.  ? ? ?SURGICAL HISTORY:  ?Past Surgical History:  ?Procedure Laterality Date  ? APPLICATION OF WOUND VAC Bilateral 02/01/2019  ? Procedure: APPLICATION OF WOUND  VAC;  Surgeon: [name], MD;  Location: [facility];  Service: General;  Laterality: Bilateral;  ? BREAST BIOPSY Right   ? BREAST LUMPECTOMY Right 1999  ? RADIATION  ? CARDIAC CATHETERIZATION  02/05/1999  ? HYPERDYNAMIC LEFT VENTRICLE WITH EF OF 75-80%  ? CARDIOVERSION N/A 01/22/2015  ? Procedure: CARDIOVERSION;  Surgeon: [name], MD;  Location: [facility];  Service: Cardiovascular;  Laterality: N/A;  ? CARDIOVERSION N/A 02/22/2015  ? Procedure: CARDIOVERSION;  Surgeon: [name], MD;  Location: [facility];  Service: Cardiovascular;  Laterality: N/A;  ? DILATION AND CURETTAGE OF UTERUS    ? ENUCLEATION Right 2012  ? EP IMPLANTABLE DEVICE N/A 02/25/2016  ? Procedure: Pacemaker Implant;  Surgeon: [name], MD;  Location: [facility];  Service: Cardiovascular;  Laterality: N/A;  ? INSERT / REPLACE / REMOVE PACEMAKER    ? LAPAROSCOPIC SMALL BOWEL RESECTION N/A 02/01/2019  ? Procedure: LAPAROSCOPIC SMALL BOWEL RESECTION WITH TAP BLOCK;  Surgeon: [name], MD;  Location: [facility];  Service: General;  Laterality: N/A;  ? LAPAROSCOPY N/A 02/01/2019  ? Procedure: LAPAROSCOPY DIAGNOSTIC;  Surgeon: [name], MD;  Location: [facility];  Service: General;  Laterality: N/A;  ? LYSIS OF ADHESION N/A 02/01/2019  ? Procedure: LYSIS OF ADHESION;  Surgeon: [name], MD;  Location: [facility];  Service: General;  Laterality: N/A;  ? TONSILLECTOMY    ? ? ?REVIEW OF SYSTEMS:  A comprehensive review of systems was negative except for: Constitutional: positive for fatigue ?Neurological: positive for dizziness  ? ?PHYSICAL EXAMINATION: General appearance: alert, cooperative, fatigued, and no distress ?Head: Normocephalic, without obvious abnormality, atraumatic ?Neck: no adenopathy, no JVD, supple, symmetrical, trachea midline, and thyroid not enlarged, symmetric, no tenderness/mass/nodules ?Lymph nodes: Cervical, supraclavicular, and axillary nodes normal. ?Resp: clear to auscultation bilaterally ?Back: symmetric, no curvature. ROM normal. No CVA tenderness. ?Cardio: regular rate and rhythm, S1, S2 normal, no murmur, click, rub or gallop ?GI: soft, non-tender; bowel sounds normal; no masses,  no organomegaly ?Extremities: extremities normal, atraumatic, no cyanosis or edema ? ?ECOG PERFORMANCE STATUS: 1 - Symptomatic but completely ambulatory ? ?Blood pressure (!) 97/55, pulse 65, temperature 98.2 ?F (36.8 ?C), temperature source Tympanic, resp. rate 16, height $RemoveBe'5\' 4"'ESTaeIrLT$  (1.626 m), weight 139  lb 9 oz (63.3 kg), SpO2 98 %. ? ?LABORATORY DATA: ?Lab Results  ?Component Value Date  ? WBC 5.0 05/08/2021  ? HGB 7.7 (L) 05/08/2021  ? HCT 23.6 (L) 05/08/2021  ? MCV 105.8 (H) 05/08/2021  ? PLT 193 05/08/2021  ? ? ?  Chemistry   ?   ?Component Value Date/Time  ? NA 139 02/22/2021 1145  ? K 5.0 02/22/2021 1145  ? CL 106 02/22/2021 1145  ? CO2 21 02/22/2021 1145  ? BUN 25 02/22/2021 1145  ? CREATININE 1.14 (H) 02/22/2021 1145  ? CREATININE 1.02 (H) 08/28/2020 1123  ?    ?Component Value Date/Time  ? CALCIUM 9.0 02/22/2021 1145  ? ALKPHOS 123 (H) 02/22/2021 1145  ? AST 19 02/22/2021 1145  ? AST 13 (L) 08/28/2020 1123  ? ALT 12 02/22/2021 1145  ? ALT 7 08/28/2020 1123  ? BILITOT 0.5 02/22/2021 1145  ? BILITOT 0.8 08/28/2020 1123  ?  ? ? ? ?RADIOGRAPHIC STUDIES: ?No results found. ? ?ASSESSMENT AND PLAN: This is a very pleasant 88 years old white female with recently diagnosed low-grade myelodysplastic syndrome in addition to questionable vitamin B12 deficiency.   ?The patient had a bone marrow biopsy and aspirate  performed recently.   ?The cytogenetics showed no 5 q. deletion. ?The patient continues on supportive care on as-needed basis. ?Repeat CBC today showed hemoglobin is down to 7.7 and hematocrit 23.6%.  She is symptomatic from her anemia. ?I recommended for the patient to receive 2 units of PRBCs transfusion in the next 1-2 days. ?I will see her back for follow-up visit in around 2 months for evaluation and repeat blood work. ?For the hypotension, she was advised to continue her routine follow-up visit and monitoring by her cardiologist. ?The patient was advised to call immediately if she has any other concerning symptoms in the interval. ?The patient voices understanding of current disease status and treatment options and is in agreement with the current care plan. ? ?All questions were answered. The patient knows to call the clinic with any problems, questions or concerns. We can certainly see the patient  much sooner if necessary. ? ? ?Disclaimer: This note was dictated with voice recognition software. Similar sounding words can inadvertently be transcribed and may not be corrected upon review. ? ? ?  ?   ?

## 2021-05-10 ENCOUNTER — Other Ambulatory Visit: Payer: Self-pay

## 2021-05-10 ENCOUNTER — Inpatient Hospital Stay: Payer: Medicare Other

## 2021-05-10 DIAGNOSIS — D461 Refractory anemia with ring sideroblasts: Secondary | ICD-10-CM | POA: Diagnosis not present

## 2021-05-10 DIAGNOSIS — D649 Anemia, unspecified: Secondary | ICD-10-CM

## 2021-05-10 DIAGNOSIS — D464 Refractory anemia, unspecified: Secondary | ICD-10-CM

## 2021-05-10 MED ORDER — ACETAMINOPHEN 325 MG PO TABS
650.0000 mg | ORAL_TABLET | Freq: Once | ORAL | Status: AC
  Administered 2021-05-10: 650 mg via ORAL
  Filled 2021-05-10: qty 2

## 2021-05-10 MED ORDER — DIPHENHYDRAMINE HCL 25 MG PO CAPS
25.0000 mg | ORAL_CAPSULE | Freq: Once | ORAL | Status: AC
  Administered 2021-05-10: 25 mg via ORAL
  Filled 2021-05-10: qty 1

## 2021-05-10 MED ORDER — SODIUM CHLORIDE 0.9% IV SOLUTION
250.0000 mL | Freq: Once | INTRAVENOUS | Status: AC
  Administered 2021-05-10: 250 mL via INTRAVENOUS

## 2021-05-10 NOTE — Patient Instructions (Signed)
Blood Transfusion, Adult, Care After This sheet gives you information about how to care for yourself after your procedure. Your doctor may also give you more specific instructions. If you have problems or questions, contact your doctor. What can I expect after the procedure? After the procedure, it is common to have: Bruising and soreness at the IV site. A headache. Follow these instructions at home: Insertion site care   Follow instructions from your doctor about how to take care of your insertion site. This is where an IV tube was put into your vein. Make sure you: Wash your hands with soap and water before and after you change your bandage (dressing). If you cannot use soap and water, use hand sanitizer. Change your bandage as told by your doctor. Check your insertion site every day for signs of infection. Check for: Redness, swelling, or pain. Bleeding from the site. Warmth. Pus or a bad smell. General instructions Take over-the-counter and prescription medicines only as told by your doctor. Rest as told by your doctor. Go back to your normal activities as told by your doctor. Keep all follow-up visits as told by your doctor. This is important. Contact a doctor if: You have itching or red, swollen areas of skin (hives). You feel worried or nervous (anxious). You feel weak after doing your normal activities. You have redness, swelling, warmth, or pain around the insertion site. You have blood coming from the insertion site, and the blood does not stop with pressure. You have pus or a bad smell coming from the insertion site. Get help right away if: You have signs of a serious reaction. This may be coming from an allergy or the body's defense system (immune system). Signs include: Trouble breathing or shortness of breath. Swelling of the face or feeling warm (flushed). Fever or chills. Head, chest, or back pain. Dark pee (urine) or blood in the pee. Widespread rash. Fast  heartbeat. Feeling dizzy or light-headed. You may receive your blood transfusion in an outpatient setting. If so, you will be told whom to contact to report any reactions. These symptoms may be an emergency. Do not wait to see if the symptoms will go away. Get medical help right away. Call your local emergency services (911 in the [place] Do not drive yourself to the hospital. Summary Bruising and soreness at the IV site are common. Check your insertion site every day for signs of infection. Rest as told by your doctor. Go back to your normal activities as told by your doctor. Get help right away if you have signs of a serious reaction. This information is not intended to replace advice given to you by your health care provider. Make sure you discuss any questions you have with your health care provider. Document Revised: 05/24/2020 Document Reviewed: 07/22/2018 Elsevier Patient Education  2022 Elsevier Inc.  

## 2021-05-11 LAB — BPAM RBC
Blood Product Expiration Date: 202304122359
Blood Product Expiration Date: 202304122359
ISSUE DATE / TIME: 202303310932
ISSUE DATE / TIME: 202303310932
Unit Type and Rh: 9500
Unit Type and Rh: 9500

## 2021-05-11 LAB — TYPE AND SCREEN
ABO/RH(D): O NEG
Antibody Screen: NEGATIVE
Unit division: 0
Unit division: 0

## 2021-05-23 ENCOUNTER — Other Ambulatory Visit: Payer: Self-pay | Admitting: Medical Oncology

## 2021-05-23 ENCOUNTER — Other Ambulatory Visit: Payer: Self-pay | Admitting: Internal Medicine

## 2021-05-26 NOTE — Progress Notes (Signed)
?Cardiology Office Note:   ? ?Date:  05/30/2021  ? ?ID:  Megan Hester, DOB Apr 02, 1930, MRN [id number] ? ?PCP:  [name], MD  ?Cardiologist:  Donato Heinz, MD  ?Electrophysiologist:  [name], MD  ? ?Referring MD: [name], MD  ? ?Chief complaint: Atrial fibrillation ? ?History of Present Illness:   ? ?Megan Hester is a 88 y.o. female with a hx of SBO with small bowel resection 02/01/2019, paroxysmal atrial fibrillation, sick sinus syndrome status post PPM, hypertension, type 2 diabetes, myelodysplastic syndrome who presents for follow-up.  She was admitted to [facility] with SBO and underwent small bowel resection 02/01/2019.  Her postoperative course was complicated by AF with RVR.  Given her hypotension, she was initially started on amiodarone drip.  She was transitioned off amiodarone due to elevated LFTs and started on metoprolol and digoxin for rate control.  Plan on discharge on 02/16/2019 was for 2 weeks of digoxin and then restart flecainide.  She reported that when she restarted flecainide 2 weeks post discharge, she developed extensive lower extremity edema and weakness/dizziness.  Her symptoms resolved with stopping flecainide.  Most recent device check 09/07/2019 showed AF burden 12%, rates controlled.  TTE 02/08/2019 showed LVEF 50 to 55%, normal RV function, indeterminate diastolic function, no significant valvular disease.  TTE 09/19/2020 showed EF 50- 96%, grade 3 diastolic dysfunction, normal RV function, mild to moderate TR, 6 mild aortic dilatation measuring 42 mm, RVSP 36. ? ?Since last clinic visit, she reports that she is doing okay.  Reports she continues to have dyspnea with exertion.  Denies any chest pain.  Reports occasional lightheadedness but denies any syncope.  Reports her symptoms are significantly improved after her recent blood transfusion. ? ?BP Readings from Last 3 Encounters:  ?05/30/21 98/78  ?05/10/21 (!) 147/88  ?05/08/21 (!) 97/55   ? ? ? ? ? ?Wt Readings from Last 3 Encounters:  ?05/30/21 141 lb (64 kg)  ?05/08/21 139 lb 9 oz (63.3 kg)  ?03/27/21 140 lb 12.8 oz (63.9 kg)  ? ? ? ? ?Past Medical History:  ?Diagnosis Date  ? Arthritis   ? "all over my body" (02/25/2016)  ? Cancer of right breast ([place]) 1999  ? DCIS  ? GERD (gastroesophageal reflux disease)   ? hx; "when I was working"  ? Hypertension   ? Macular degeneration   ? Paroxysmal atrial fibrillation (HCC)   ? Presence of permanent cardiac pacemaker   ? Retinal hemorrhage   ? with recent denucleation of R eye  ? ? ?Past Surgical History:  ?Procedure Laterality Date  ? APPLICATION OF WOUND VAC Bilateral 02/01/2019  ? Procedure: APPLICATION OF WOUND VAC;  Surgeon: [name], MD;  [facility];  Service: General;  Laterality: Bilateral;  ? BREAST BIOPSY Right   ? BREAST LUMPECTOMY Right 1999  ? RADIATION  ? CARDIAC CATHETERIZATION  02/05/1999  ? HYPERDYNAMIC LEFT VENTRICLE WITH EF OF 75-80%  ? CARDIOVERSION N/A 01/22/2015  ? Procedure: CARDIOVERSION;  Surgeon: [name], MD;  Location: [facility];  Service: Cardiovascular;  Laterality: N/A;  ? CARDIOVERSION N/A 02/22/2015  ? Procedure: CARDIOVERSION;  Surgeon: [name], MD;  Location: [facility] ENDOSCOPY;  Service: Cardiovascular;  Laterality: N/A;  ? DILATION AND CURETTAGE OF UTERUS    ? ENUCLEATION Right 2012  ? EP IMPLANTABLE DEVICE N/A 02/25/2016  ? Procedure: Pacemaker Implant;  Surgeon: [name], MD;  Location: [place];  Service: Cardiovascular;  Laterality: N/A;  ?  INSERT / REPLACE / REMOVE PACEMAKER    ? LAPAROSCOPIC SMALL BOWEL RESECTION N/A 02/01/2019  ? Procedure: LAPAROSCOPIC SMALL BOWEL RESECTION WITH TAP BLOCK;  Surgeon: [name], MD;  [facility];  Service: General;  Laterality: N/A;  ? LAPAROSCOPY N/A 02/01/2019  ? Procedure: LAPAROSCOPY DIAGNOSTIC;  Surgeon: [name], MD;  [facility];  Service: General;  Laterality: N/A;  ? LYSIS OF ADHESION N/A 02/01/2019  ? Procedure:  LYSIS OF ADHESION;  Surgeon: [name], MD;  Location: [facility];  Service: General;  Laterality: N/A;  ? TONSILLECTOMY    ? ? ?Current Medications: ?Current Meds  ?Medication Sig  ? amiodarone (PACERONE) 200 MG tablet Take 200 mg by mouth daily.  ? cetirizine (ZYRTEC) 10 MG tablet Take 10 mg by mouth daily with supper.  ? Cholecalciferol (VITAMIN D) 1000 UNITS capsule Take 1,000 Units by mouth daily with supper.  ? clotrimazole-betamethasone (LOTRISONE) cream Apply 1 application topically 2 (two) times daily. Female parts  ? Glucosamine HCl POWD by Does not apply route.  ? Hypromellose (ARTIFICIAL TEARS OP) Place 1 drop into the left eye daily as needed (dry eye).  ? Melatonin 10 MG TABS Take 10 mg by mouth daily in the afternoon. Supper and bedime  ? metoprolol tartrate (LOPRESSOR) 25 MG tablet Take 1 tablet (25 mg total) by mouth 2 (two) times daily.  ? Multiple Vitamins-Minerals (MULTIVITAMIN WITH MINERALS) tablet Take 1 tablet by mouth 2 (two) times daily.  ? Multiple Vitamins-Minerals (PRESERVISION/LUTEIN PO) Take 1 capsule by mouth 2 (two) times daily.   ? Polyethyl Glycol-Propyl Glycol (SYSTANE OP) Place 1 drop into the right eye daily as needed (dry eye). gel  ? polyethylene glycol (MIRALAX / GLYCOLAX) packet Take 17 g by mouth every evening.  ? QUEtiapine (SEROQUEL) 25 MG tablet Take 25 mg by mouth at bedtime.  ? sertraline (ZOLOFT) 100 MG tablet Take 100 mg by mouth at bedtime.  ? XARELTO 15 MG TABS tablet TAKE ONE TABLET BY MOUTH ONCE DAILY WITH SUPPER  ?  ? ?Allergies:   Shellfish allergy, Atropine, Codeine, Doxycycline, Erythromycin, Fenoprofen calcium, Iodinated contrast media, Isopto hyoscine [scopolamine], Levaquin [levofloxacin], Naproxen, Other, Oxycodone, Penicillins, Sulfa drugs cross reactors, Warfarin, Ceclor [cefaclor], and Tape  ? ?Social History  ? ?Socioeconomic History  ? Marital status: Married  ?  Spouse name: Not on file  ? Number of children: 3  ? Years of education: Not on file  ?  Highest education level: Not on file  ?Occupational History  ?  Employer: RETIRED  ?Tobacco Use  ? Smoking status: Never  ? Smokeless tobacco: Never  ?Vaping Use  ? Vaping Use: Never used  ?Substance and Sexual Activity  ? Alcohol use: Yes  ?  Alcohol/week: 0.0 standard drinks  ?  Comment: 02/25/2016 "glass of wine/month"  ? Drug use: No  ? Sexual activity: Yes  ?  Birth control/protection: Post-menopausal  ?  Comment: 1st intercourse 51 yo-1 partner  ?Other Topics Concern  ? Not on file  ?Social History Narrative  ? Lives in [place].  Retired Solicitor.  ? ?Social Determinants of Health  ? ?Financial Resource Strain: Not on file  ?Food Insecurity: Not on file  ?Transportation Needs: Not on file  ?Physical Activity: Not on file  ?Stress: Not on file  ?Social Connections: Not on file  ?  ? ?Family History: ?The patient's family history includes Diabetes in her brother; Heart attack in her father; Heart disease in her brother; Hypertension in her  father; Lung cancer in her brother; Parkinsonism in her father; Stroke in her mother. ? ?ROS:   ?Please see the history of present illness.    ?All other systems reviewed and are negative. ? ?EKGs/Labs/Other Studies Reviewed:   ? ?The following studies were reviewed today: ? ?02/08/2019 Echo: ?1. Left ventricular ejection fraction, by visual estimation, is 50 to  ?55%. The left ventricle has low normal function. Left ventricular septal  ?wall thickness was normal. Normal left ventricular posterior wall  ?thickness. There is no left ventricular  ?hypertrophy.  ? 2. Left ventricular diastolic parameters are indeterminate.  ? 3. The left ventricle has no regional wall motion abnormalities.  ? 4. Global right ventricle has normal systolic function.The right  ?ventricular size is normal. No increase in right ventricular wall  ?thickness.  ? 5. Left atrial size was normal.  ? 6. Right atrial size was normal.  ? 7. The mitral valve is normal in structure. Trivial mitral  valve  ?regurgitation. No evidence of mitral stenosis.  ? 8. The tricuspid valve is normal in structure.  ? 9. The aortic valve is tricuspid. Aortic valve regurgitation is not  ?visualized. No evidence of aortic

## 2021-05-27 ENCOUNTER — Ambulatory Visit (INDEPENDENT_AMBULATORY_CARE_PROVIDER_SITE_OTHER): Payer: Medicare Other

## 2021-05-27 DIAGNOSIS — I495 Sick sinus syndrome: Secondary | ICD-10-CM | POA: Diagnosis not present

## 2021-05-28 ENCOUNTER — Telehealth: Payer: Self-pay

## 2021-05-28 LAB — CUP PACEART REMOTE DEVICE CHECK
Battery Remaining Longevity: 44 mo
Battery Voltage: 2.98 V
Brady Statistic AP VP Percent: 4.95 %
Brady Statistic AP VS Percent: 52.93 %
Brady Statistic AS VP Percent: 13.41 %
Brady Statistic AS VS Percent: 28.71 %
Brady Statistic RA Percent Paced: 51.56 %
Brady Statistic RV Percent Paced: 15.74 %
Date Time Interrogation Session: 20230417154109
Implantable Lead Implant Date: 20180115
Implantable Lead Implant Date: 20180115
Implantable Lead Location: 753859
Implantable Lead Location: 753860
Implantable Lead Model: 5076
Implantable Lead Model: 5076
Implantable Pulse Generator Implant Date: 20180115
Lead Channel Impedance Value: 323 Ohm
Lead Channel Impedance Value: 380 Ohm
Lead Channel Impedance Value: 399 Ohm
Lead Channel Impedance Value: 456 Ohm
Lead Channel Pacing Threshold Amplitude: 0.625 V
Lead Channel Pacing Threshold Amplitude: 0.875 V
Lead Channel Pacing Threshold Pulse Width: 0.4 ms
Lead Channel Pacing Threshold Pulse Width: 0.4 ms
Lead Channel Sensing Intrinsic Amplitude: 0.5 mV
Lead Channel Sensing Intrinsic Amplitude: 0.5 mV
Lead Channel Sensing Intrinsic Amplitude: 11.25 mV
Lead Channel Sensing Intrinsic Amplitude: 11.25 mV
Lead Channel Setting Pacing Amplitude: 2 V
Lead Channel Setting Pacing Amplitude: 2.5 V
Lead Channel Setting Pacing Pulse Width: 0.4 ms
Lead Channel Setting Sensing Sensitivity: 2 mV

## 2021-05-28 NOTE — Telephone Encounter (Signed)
Scheduled remote reviewed. Normal device function.   ?2316 AF events, longest 92mn, ventricular rates controlled ?Known PAF, burden 35%, previous 4.3% Xarelto, Metoprolol, and Amiodarone prescribed ?Next remote 91 days. ?Route to triage for increased burden ? ?Patient has scheduled follow up Dr. SGardiner Rhyme4/20/23.  Will forward to discuss at appointment. ? ? ?

## 2021-05-30 ENCOUNTER — Ambulatory Visit: Payer: Medicare Other | Admitting: Cardiology

## 2021-05-30 ENCOUNTER — Encounter: Payer: Self-pay | Admitting: Cardiology

## 2021-05-30 VITALS — BP 98/78 | HR 60 | Ht 64.0 in | Wt 141.0 lb

## 2021-05-30 DIAGNOSIS — I480 Paroxysmal atrial fibrillation: Secondary | ICD-10-CM

## 2021-05-30 DIAGNOSIS — I495 Sick sinus syndrome: Secondary | ICD-10-CM

## 2021-05-30 DIAGNOSIS — R0609 Other forms of dyspnea: Secondary | ICD-10-CM | POA: Diagnosis not present

## 2021-05-30 DIAGNOSIS — D649 Anemia, unspecified: Secondary | ICD-10-CM

## 2021-05-30 MED ORDER — METOPROLOL SUCCINATE ER 25 MG PO TB24: 25.0000 mg | ORAL_TABLET | Freq: Every day | ORAL | 3 refills | Status: DC

## 2021-05-30 NOTE — Patient Instructions (Signed)
Medication Instructions:  ? ? ?DISCONTINUE Lopressor ? ?START Toprol one (1) tablet by mouth ( 25 mg) daily.  ? ?*If you need a refill on your cardiac medications before your next appointment, please call your pharmacy* ? ? ? ?Follow-Up: ?At [facility], you and your health needs are our priority.  As part of our continuing mission to provide you with exceptional heart care, we have created designated Provider Care Teams.  These Care Teams include your primary Cardiologist (physician) and Advanced Practice Providers (APPs -  Physician Assistants and Nurse Practitioners) who all work together to provide you with the care you need, when you need it. ? ?We recommend signing up for the patient portal called "MyChart".  Sign up information is provided on this After Visit Summary.  MyChart is used to connect with patients for Virtual Visits (Telemedicine).  Patients are able to view lab/test results, encounter notes, upcoming appointments, etc.  Non-urgent messages can be sent to your provider as well.   ?To learn more about what you can do with MyChart, go to [email or web address].   ? ?Your next appointment:   ?6 month(s) ? ?The format for your next appointment:   ?In Person ? ?Provider:   ?Donato Heinz, MD   ? ? ? ?Important Information About Sugar ? ? ? ? ?  ?

## 2021-05-31 ENCOUNTER — Inpatient Hospital Stay: Payer: Medicare Other

## 2021-06-04 ENCOUNTER — Other Ambulatory Visit: Payer: Self-pay

## 2021-06-04 ENCOUNTER — Inpatient Hospital Stay: Payer: Medicare Other | Attending: Internal Medicine

## 2021-06-04 DIAGNOSIS — E538 Deficiency of other specified B group vitamins: Secondary | ICD-10-CM | POA: Diagnosis present

## 2021-06-04 DIAGNOSIS — D539 Nutritional anemia, unspecified: Secondary | ICD-10-CM

## 2021-06-04 MED ORDER — CYANOCOBALAMIN 1000 MCG/ML IJ SOLN
1000.0000 ug | INTRAMUSCULAR | Status: DC
  Administered 2021-06-04: 1000 ug via INTRAMUSCULAR
  Filled 2021-06-04: qty 1

## 2021-06-12 NOTE — Progress Notes (Signed)
Remote pacemaker transmission.   

## 2021-06-28 ENCOUNTER — Telehealth: Payer: Self-pay | Admitting: Internal Medicine

## 2021-06-28 NOTE — Telephone Encounter (Signed)
Called patient regarding upcoming appointment, voicemail was left.  

## 2021-07-02 ENCOUNTER — Inpatient Hospital Stay: Payer: Medicare Other | Admitting: Internal Medicine

## 2021-07-02 ENCOUNTER — Inpatient Hospital Stay: Payer: Medicare Other

## 2021-07-02 ENCOUNTER — Other Ambulatory Visit: Payer: Self-pay | Admitting: Medical Oncology

## 2021-07-02 ENCOUNTER — Other Ambulatory Visit: Payer: Self-pay

## 2021-07-02 ENCOUNTER — Inpatient Hospital Stay: Payer: Medicare Other | Attending: Internal Medicine

## 2021-07-02 VITALS — BP 97/75 | HR 73 | Temp 97.5°F | Resp 16 | Wt 142.5 lb

## 2021-07-02 DIAGNOSIS — D62 Acute posthemorrhagic anemia: Secondary | ICD-10-CM | POA: Diagnosis not present

## 2021-07-02 DIAGNOSIS — D539 Nutritional anemia, unspecified: Secondary | ICD-10-CM

## 2021-07-02 DIAGNOSIS — E538 Deficiency of other specified B group vitamins: Secondary | ICD-10-CM | POA: Insufficient documentation

## 2021-07-02 DIAGNOSIS — D469 Myelodysplastic syndrome, unspecified: Secondary | ICD-10-CM | POA: Diagnosis not present

## 2021-07-02 DIAGNOSIS — D461 Refractory anemia with ring sideroblasts: Secondary | ICD-10-CM | POA: Insufficient documentation

## 2021-07-02 DIAGNOSIS — D464 Refractory anemia, unspecified: Secondary | ICD-10-CM

## 2021-07-02 LAB — CBC WITH DIFFERENTIAL (CANCER CENTER ONLY)
Abs Immature Granulocytes: 0.01 10*3/uL (ref 0.00–0.07)
Basophils Absolute: 0 10*3/uL (ref 0.0–0.1)
Basophils Relative: 1 %
Eosinophils Absolute: 0.2 10*3/uL (ref 0.0–0.5)
Eosinophils Relative: 4 %
HCT: 21.8 % — ABNORMAL LOW (ref 36.0–46.0)
Hemoglobin: 7.3 g/dL — ABNORMAL LOW (ref 12.0–15.0)
Immature Granulocytes: 0 %
Lymphocytes Relative: 18 %
Lymphs Abs: 0.9 10*3/uL (ref 0.7–4.0)
MCH: 36.1 pg — ABNORMAL HIGH (ref 26.0–34.0)
MCHC: 33.5 g/dL (ref 30.0–36.0)
MCV: 107.9 fL — ABNORMAL HIGH (ref 80.0–100.0)
Monocytes Absolute: 0.4 10*3/uL (ref 0.1–1.0)
Monocytes Relative: 8 %
Neutro Abs: 3.6 10*3/uL (ref 1.7–7.7)
Neutrophils Relative %: 69 %
Platelet Count: 190 10*3/uL (ref 150–400)
RBC: 2.02 MIL/uL — ABNORMAL LOW (ref 3.87–5.11)
RDW: 21.2 % — ABNORMAL HIGH (ref 11.5–15.5)
WBC Count: 5.1 10*3/uL (ref 4.0–10.5)
nRBC: 0.6 % — ABNORMAL HIGH (ref 0.0–0.2)

## 2021-07-02 LAB — SAMPLE TO BLOOD BANK

## 2021-07-02 LAB — PREPARE RBC (CROSSMATCH)

## 2021-07-02 LAB — FERRITIN: Ferritin: 694 ng/mL — ABNORMAL HIGH (ref 11–307)

## 2021-07-02 LAB — IRON AND IRON BINDING CAPACITY (CC-WL,HP ONLY)
Iron: 83 ug/dL (ref 28–170)
Saturation Ratios: 37 % — ABNORMAL HIGH (ref 10.4–31.8)
TIBC: 227 ug/dL — ABNORMAL LOW (ref 250–450)
UIBC: 144 ug/dL — ABNORMAL LOW (ref 148–442)

## 2021-07-02 MED ORDER — CYANOCOBALAMIN 1000 MCG/ML IJ SOLN
1000.0000 ug | INTRAMUSCULAR | Status: DC
  Administered 2021-07-02: 1000 ug via INTRAMUSCULAR
  Filled 2021-07-02: qty 1

## 2021-07-02 NOTE — Progress Notes (Signed)
Blood transfusion ordered.

## 2021-07-02 NOTE — Progress Notes (Signed)
[place] Telephone:([phone]) [phone]   Fax:([phone]) [phone]  OFFICE PROGRESS NOTE  [name], MD [place] [address]  DIAGNOSIS:  Low-grade myelodysplastic syndrome with ring sideroblasts.  There was also an element of vitamin B12 deficiency after small bowel resection for an obstruction in December 2020.  PRIOR THERAPY:None  CURRENT THERAPY:  1) Vitamin B12 injection initially weekly then every 2 weeks and then monthly. 2) supportive care with transfusion on as-needed basis  INTERVAL HISTORY: Megan Hester 87 y.o. female returns to the clinic today for follow-up visit accompanied by her daughter-in-law.  The patient is feeling fine today with no concerning complaints except for the baseline fatigue.  She denied having any current chest pain, shortness of breath except with exertion with no cough or hemoptysis.  She has no nausea, vomiting, diarrhea or constipation.  She has no headache or visual changes.  She is here today for evaluation and repeat blood work.  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  MEDICATIONS:  Current Outpatient Medications  Medication Sig Dispense Refill   amiodarone (PACERONE) 200 MG tablet Take 200 mg by mouth daily.     cetirizine (ZYRTEC) 10 MG tablet  Take 10 mg by mouth daily with supper.     Cholecalciferol (VITAMIN D) 1000 UNITS capsule Take 1,000 Units by mouth daily with supper.     clotrimazole-betamethasone (LOTRISONE) cream Apply 1 application topically 2 (two) times daily. Female parts     Glucosamine HCl POWD by Does not apply route.     Hypromellose (ARTIFICIAL TEARS OP) Place 1 drop into the left eye daily as needed (dry eye).     Melatonin 10 MG TABS Take 10 mg by mouth daily in the afternoon. Supper and bedime     metoprolol succinate (TOPROL XL) 25 MG 24 hr tablet Take 1 tablet (25 mg total) by mouth daily. 90 tablet 3   Multiple Vitamins-Minerals (MULTIVITAMIN WITH MINERALS) tablet Take 1 tablet by mouth 2 (two) times daily.     Multiple Vitamins-Minerals (PRESERVISION/LUTEIN PO) Take 1 capsule by mouth 2 (two) times daily.      Polyethyl Glycol-Propyl Glycol (SYSTANE OP) Place 1 drop into the right eye daily as needed (dry eye). gel     polyethylene glycol (MIRALAX / GLYCOLAX) packet Take 17 g by mouth every evening.     QUEtiapine (SEROQUEL) 25 MG tablet Take 25 mg by mouth at bedtime.     sertraline (ZOLOFT) 100 MG tablet Take 100 mg by mouth at bedtime.     XARELTO 15 MG TABS tablet TAKE ONE TABLET BY MOUTH ONCE DAILY WITH SUPPER 30 tablet 5   No current facility-administered medications for this visit.    SURGICAL HISTORY:  Past Surgical History:  Procedure Laterality Date   APPLICATION OF WOUND VAC Bilateral 02/01/2019  Procedure: APPLICATION OF WOUND VAC;  Surgeon: [name], MD;  Location: [facility];  Service: General;  Laterality: Bilateral;   BREAST BIOPSY Right    BREAST LUMPECTOMY Right 1999   RADIATION   CARDIAC CATHETERIZATION  02/05/1999   HYPERDYNAMIC LEFT VENTRICLE WITH EF OF 75-80%   CARDIOVERSION N/A 01/22/2015   Procedure: CARDIOVERSION;  Surgeon: [name], MD;  Location: [facility];  Service: Cardiovascular;  Laterality: N/A;   CARDIOVERSION N/A 02/22/2015   Procedure: CARDIOVERSION;   Surgeon: [name], MD;  Location: [facility];  Service: Cardiovascular;  Laterality: N/A;   DILATION AND CURETTAGE OF UTERUS     ENUCLEATION Right 2012   EP IMPLANTABLE DEVICE N/A 02/25/2016   Procedure: Pacemaker Implant;  Surgeon: [name], MD;  Location: [facility];  Service: Cardiovascular;  Laterality: N/A;   INSERT / REPLACE / REMOVE PACEMAKER     LAPAROSCOPIC SMALL BOWEL RESECTION N/A 02/01/2019   Procedure: LAPAROSCOPIC SMALL BOWEL RESECTION WITH TAP BLOCK;  Surgeon: [name], MD;  Location: [facility];  Service: General;  Laterality: N/A;   LAPAROSCOPY N/A 02/01/2019   Procedure: LAPAROSCOPY DIAGNOSTIC;  Surgeon: [name], MD;  Location: [facility];  Service: General;  Laterality: N/A;   LYSIS OF ADHESION N/A 02/01/2019   Procedure: LYSIS OF ADHESION;  Surgeon: [name], MD;  Location: [facility];  Service: General;  Laterality: N/A;   TONSILLECTOMY      REVIEW OF SYSTEMS:  A comprehensive review of systems was negative except for: Constitutional: positive for fatigue Respiratory: positive for dyspnea on exertion Neurological: positive for dizziness   PHYSICAL EXAMINATION: General appearance: alert, cooperative, fatigued, and no distress Head: Normocephalic, without obvious abnormality, atraumatic Neck: no adenopathy, no JVD, supple, symmetrical, trachea midline, and thyroid not enlarged, symmetric, no tenderness/mass/nodules Lymph nodes: Cervical, supraclavicular, and axillary nodes normal. Resp: clear to auscultation bilaterally Back: symmetric, no curvature. ROM normal. No CVA tenderness. Cardio: regular rate and rhythm, S1, S2 normal, no murmur, click, rub or gallop GI: soft, non-tender; bowel sounds normal; no masses,  no organomegaly Extremities: extremities normal, atraumatic, no cyanosis or edema  ECOG PERFORMANCE STATUS: 1 - Symptomatic but completely ambulatory  Blood pressure 97/75, pulse 73, temperature (!) 97.5 F (36.4 C), temperature  source Tympanic, resp. rate 16, weight 142 lb 8 oz (64.6 kg), SpO2 96 %.  LABORATORY DATA: Lab Results  Component Value Date   WBC 5.1 07/02/2021   HGB 7.3 (L) 07/02/2021   HCT 21.8 (L) 07/02/2021   MCV 107.9 (H) 07/02/2021   PLT 190 07/02/2021      Chemistry      Component Value Date/Time   NA 139 02/22/2021 1145   K 5.0 02/22/2021 1145   CL 106 02/22/2021 1145   CO2 21 02/22/2021 1145   BUN 25 02/22/2021 1145   CREATININE 1.14 (H) 02/22/2021 1145   CREATININE 1.02 (H) 08/28/2020 1123      Component Value Date/Time   CALCIUM 9.0 02/22/2021 1145   ALKPHOS 123 (H) 02/22/2021 1145   AST 19 02/22/2021 1145   AST 13 (L) 08/28/2020 1123   ALT 12 02/22/2021 1145   ALT 7 08/28/2020 1123   BILITOT 0.5 02/22/2021 1145   BILITOT 0.8 08/28/2020 1123       RADIOGRAPHIC STUDIES: No results found.  ASSESSMENT AND PLAN: This is a very pleasant 88 years old white female with recently diagnosed low-grade myelodysplastic syndrome in addition to questionable vitamin B12 deficiency.   The patient had a  bone marrow biopsy and aspirate performed recently.   The cytogenetics showed no 5 q. deletion. The patient continues on supportive care on as-needed basis. Repeat CBC today showed worsening anemia with hemoglobin of 7.3 and hematocrit 21.8%.  Iron study showed normal serum iron of 83 with iron saturation of 37% and TIBC of 227 with elevated ferritin level of 694. I recommended for the patient to receive 2 units of PRBCs transfusion because of her symptomatic anemia. She will continue on observation for now with repeat CBC, iron study and ferritin in 2 months. The patient was advised to call immediately if she has any other concerning symptoms in the interval.  The patient voices understanding of current disease status and treatment options and is in agreement with the current care plan.  All questions were answered. The patient knows to call the clinic with any problems, questions or  concerns. We can certainly see the patient much sooner if necessary.   Disclaimer: This note was dictated with voice recognition software. Similar sounding words can inadvertently be transcribed and may not be corrected upon review.

## 2021-07-03 ENCOUNTER — Inpatient Hospital Stay: Payer: Medicare Other

## 2021-07-03 DIAGNOSIS — D539 Nutritional anemia, unspecified: Secondary | ICD-10-CM

## 2021-07-03 DIAGNOSIS — D461 Refractory anemia with ring sideroblasts: Secondary | ICD-10-CM | POA: Diagnosis not present

## 2021-07-03 MED ORDER — SODIUM CHLORIDE 0.9% IV SOLUTION
250.0000 mL | Freq: Once | INTRAVENOUS | Status: AC
  Administered 2021-07-03: 250 mL via INTRAVENOUS

## 2021-07-03 MED ORDER — DIPHENHYDRAMINE HCL 25 MG PO CAPS
25.0000 mg | ORAL_CAPSULE | Freq: Once | ORAL | Status: AC
  Administered 2021-07-03: 25 mg via ORAL
  Filled 2021-07-03: qty 1

## 2021-07-03 MED ORDER — ACETAMINOPHEN 325 MG PO TABS
650.0000 mg | ORAL_TABLET | Freq: Once | ORAL | Status: AC
  Administered 2021-07-03: 650 mg via ORAL
  Filled 2021-07-03: qty 2

## 2021-07-03 NOTE — Patient Instructions (Signed)
Blood Transfusion, Adult, Care After This sheet gives you information about how to care for yourself after your procedure. Your doctor may also give you more specific instructions. If you have problems or questions, contact your doctor. What can I expect after the procedure? After the procedure, it is common to have: Bruising and soreness at the IV site. A headache. Follow these instructions at home: Insertion site care     Follow instructions from your doctor about how to take care of your insertion site. This is where an IV tube was put into your vein. Make sure you: Wash your hands with soap and water before and after you change your bandage (dressing). If you cannot use soap and water, use hand sanitizer. Change your bandage as told by your doctor. Check your insertion site every day for signs of infection. Check for: Redness, swelling, or pain. Bleeding from the site. Warmth. Pus or a bad smell. General instructions Take over-the-counter and prescription medicines only as told by your doctor. Rest as told by your doctor. Go back to your normal activities as told by your doctor. Keep all follow-up visits as told by your doctor. This is important. Contact a doctor if: You have itching or red, swollen areas of skin (hives). You feel worried or nervous (anxious). You feel weak after doing your normal activities. You have redness, swelling, warmth, or pain around the insertion site. You have blood coming from the insertion site, and the blood does not stop with pressure. You have pus or a bad smell coming from the insertion site. Get help right away if: You have signs of a serious reaction. This may be coming from an allergy or the body's defense system (immune system). Signs include: Trouble breathing or shortness of breath. Swelling of the face or feeling warm (flushed). Fever or chills. Head, chest, or back pain. Dark pee (urine) or blood in the pee. Widespread rash. Fast  heartbeat. Feeling dizzy or light-headed. You may receive your blood transfusion in an outpatient setting. If so, you will be told whom to contact to report any reactions. These symptoms may be an emergency. Do not wait to see if the symptoms will go away. Get medical help right away. Call your local emergency services (911 in the [place] Do not drive yourself to the hospital. Summary Bruising and soreness at the IV site are common. Check your insertion site every day for signs of infection. Rest as told by your doctor. Go back to your normal activities as told by your doctor. Get help right away if you have signs of a serious reaction. This information is not intended to replace advice given to you by your health care provider. Make sure you discuss any questions you have with your health care provider. Document Revised: 05/24/2020 Document Reviewed: 07/22/2018 Elsevier Patient Education  2023 [employer]  

## 2021-07-04 LAB — TYPE AND SCREEN
ABO/RH(D): O NEG
Antibody Screen: NEGATIVE
Unit division: 0
Unit division: 0

## 2021-07-04 LAB — BPAM RBC
Blood Product Expiration Date: 202306162359
Blood Product Expiration Date: 202306192359
ISSUE DATE / TIME: 202305240810
ISSUE DATE / TIME: 202305240810
Unit Type and Rh: 9500
Unit Type and Rh: 9500

## 2021-07-12 ENCOUNTER — Telehealth: Payer: Self-pay | Admitting: Internal Medicine

## 2021-07-12 NOTE — Telephone Encounter (Signed)
Called patient regarding upcoming June and July appointments, patient is notified.  

## 2021-08-02 ENCOUNTER — Inpatient Hospital Stay: Payer: Medicare Other | Attending: Internal Medicine

## 2021-08-02 ENCOUNTER — Other Ambulatory Visit: Payer: Self-pay

## 2021-08-02 DIAGNOSIS — D461 Refractory anemia with ring sideroblasts: Secondary | ICD-10-CM | POA: Insufficient documentation

## 2021-08-02 DIAGNOSIS — D539 Nutritional anemia, unspecified: Secondary | ICD-10-CM

## 2021-08-02 DIAGNOSIS — E538 Deficiency of other specified B group vitamins: Secondary | ICD-10-CM | POA: Diagnosis present

## 2021-08-02 MED ORDER — CYANOCOBALAMIN 1000 MCG/ML IJ SOLN
1000.0000 ug | INTRAMUSCULAR | Status: DC
  Administered 2021-08-02: 1000 ug via INTRAMUSCULAR
  Filled 2021-08-02: qty 1

## 2021-08-26 ENCOUNTER — Ambulatory Visit (INDEPENDENT_AMBULATORY_CARE_PROVIDER_SITE_OTHER): Payer: Medicare Other

## 2021-08-26 DIAGNOSIS — I495 Sick sinus syndrome: Secondary | ICD-10-CM

## 2021-08-27 LAB — CUP PACEART REMOTE DEVICE CHECK
Battery Remaining Longevity: 31 mo
Battery Voltage: 2.97 V
Brady Statistic AP VP Percent: 4.57 %
Brady Statistic AP VS Percent: 49.61 %
Brady Statistic AS VP Percent: 10.57 %
Brady Statistic AS VS Percent: 35.26 %
Brady Statistic RA Percent Paced: 47.02 %
Brady Statistic RV Percent Paced: 12.63 %
Date Time Interrogation Session: 20230717144225
Implantable Lead Implant Date: 20180115
Implantable Lead Implant Date: 20180115
Implantable Lead Location: 753859
Implantable Lead Location: 753860
Implantable Lead Model: 5076
Implantable Lead Model: 5076
Implantable Pulse Generator Implant Date: 20180115
Lead Channel Impedance Value: 323 Ohm
Lead Channel Impedance Value: 380 Ohm
Lead Channel Impedance Value: 380 Ohm
Lead Channel Impedance Value: 456 Ohm
Lead Channel Pacing Threshold Amplitude: 0.75 V
Lead Channel Pacing Threshold Amplitude: 0.75 V
Lead Channel Pacing Threshold Pulse Width: 0.4 ms
Lead Channel Pacing Threshold Pulse Width: 0.4 ms
Lead Channel Sensing Intrinsic Amplitude: 0.625 mV
Lead Channel Sensing Intrinsic Amplitude: 0.625 mV
Lead Channel Sensing Intrinsic Amplitude: 10.5 mV
Lead Channel Sensing Intrinsic Amplitude: 10.5 mV
Lead Channel Setting Pacing Amplitude: 2.5 V
Lead Channel Setting Pacing Amplitude: 3.25 V
Lead Channel Setting Pacing Pulse Width: 0.4 ms
Lead Channel Setting Sensing Sensitivity: 2 mV

## 2021-08-29 ENCOUNTER — Telehealth: Payer: Self-pay | Admitting: Internal Medicine

## 2021-08-29 NOTE — Telephone Encounter (Signed)
Per 7/20 phone line pt daughter in law called to r/s appointment  appointment r/s per pt request

## 2021-08-29 NOTE — Telephone Encounter (Signed)
Called patient regarding upcoming Rescheduled July appointment, patient has been called and voicemail was left.

## 2021-09-02 ENCOUNTER — Emergency Department (HOSPITAL_COMMUNITY): Payer: Medicare Other

## 2021-09-02 ENCOUNTER — Other Ambulatory Visit: Payer: Self-pay

## 2021-09-02 ENCOUNTER — Emergency Department (HOSPITAL_COMMUNITY)
Admission: EM | Admit: 2021-09-02 | Discharge: 2021-09-02 | Disposition: A | Discharge status: Home / Self Care | Payer: Medicare Other | Attending: Emergency Medicine | Admitting: Emergency Medicine

## 2021-09-02 ENCOUNTER — Encounter (HOSPITAL_COMMUNITY): Payer: Self-pay

## 2021-09-02 DIAGNOSIS — S0990XA Unspecified injury of head, initial encounter: Secondary | ICD-10-CM | POA: Diagnosis not present

## 2021-09-02 DIAGNOSIS — S0181XA Laceration without foreign body of other part of head, initial encounter: Secondary | ICD-10-CM | POA: Insufficient documentation

## 2021-09-02 DIAGNOSIS — W01198A Fall on same level from slipping, tripping and stumbling with subsequent striking against other object, initial encounter: Secondary | ICD-10-CM | POA: Insufficient documentation

## 2021-09-02 DIAGNOSIS — I480 Paroxysmal atrial fibrillation: Secondary | ICD-10-CM | POA: Insufficient documentation

## 2021-09-02 DIAGNOSIS — Z79899 Other long term (current) drug therapy: Secondary | ICD-10-CM | POA: Insufficient documentation

## 2021-09-02 DIAGNOSIS — D6489 Other specified anemias: Secondary | ICD-10-CM | POA: Diagnosis not present

## 2021-09-02 DIAGNOSIS — I10 Essential (primary) hypertension: Secondary | ICD-10-CM | POA: Insufficient documentation

## 2021-09-02 DIAGNOSIS — M542 Cervicalgia: Secondary | ICD-10-CM | POA: Insufficient documentation

## 2021-09-02 DIAGNOSIS — Z853 Personal history of malignant neoplasm of breast: Secondary | ICD-10-CM | POA: Diagnosis not present

## 2021-09-02 DIAGNOSIS — S01111A Laceration without foreign body of right eyelid and periocular area, initial encounter: Secondary | ICD-10-CM | POA: Diagnosis not present

## 2021-09-02 DIAGNOSIS — Z7901 Long term (current) use of anticoagulants: Secondary | ICD-10-CM | POA: Diagnosis not present

## 2021-09-02 DIAGNOSIS — S0993XA Unspecified injury of face, initial encounter: Secondary | ICD-10-CM | POA: Diagnosis present

## 2021-09-02 LAB — CBC WITH DIFFERENTIAL/PLATELET
Abs Immature Granulocytes: 0.02 10*3/uL (ref 0.00–0.07)
Basophils Absolute: 0 10*3/uL (ref 0.0–0.1)
Basophils Relative: 1 %
Eosinophils Absolute: 0.2 10*3/uL (ref 0.0–0.5)
Eosinophils Relative: 4 %
HCT: 23.3 % — ABNORMAL LOW (ref 36.0–46.0)
Hemoglobin: 7.4 g/dL — ABNORMAL LOW (ref 12.0–15.0)
Immature Granulocytes: 0 %
Lymphocytes Relative: 29 %
Lymphs Abs: 1.7 10*3/uL (ref 0.7–4.0)
MCH: 35.6 pg — ABNORMAL HIGH (ref 26.0–34.0)
MCHC: 31.8 g/dL (ref 30.0–36.0)
MCV: 112 fL — ABNORMAL HIGH (ref 80.0–100.0)
Monocytes Absolute: 0.5 10*3/uL (ref 0.1–1.0)
Monocytes Relative: 9 %
Neutro Abs: 3.3 10*3/uL (ref 1.7–7.7)
Neutrophils Relative %: 57 %
Platelets: 215 10*3/uL (ref 150–400)
RBC: 2.08 MIL/uL — ABNORMAL LOW (ref 3.87–5.11)
RDW: 24.8 % — ABNORMAL HIGH (ref 11.5–15.5)
WBC: 5.8 10*3/uL (ref 4.0–10.5)
nRBC: 0 % (ref 0.0–0.2)

## 2021-09-02 LAB — BASIC METABOLIC PANEL
Anion gap: 4 — ABNORMAL LOW (ref 5–15)
BUN: 19 mg/dL (ref 8–23)
CO2: 23 mmol/L (ref 22–32)
Calcium: 9.2 mg/dL (ref 8.9–10.3)
Chloride: 110 mmol/L (ref 98–111)
Creatinine, Ser: 1.02 mg/dL — ABNORMAL HIGH (ref 0.44–1.00)
GFR, Estimated: 53 mL/min — ABNORMAL LOW (ref >60)
Glucose, Bld: 108 mg/dL — ABNORMAL HIGH (ref 70–99)
Potassium: 4.1 mmol/L (ref 3.5–5.1)
Sodium: 137 mmol/L (ref 135–145)

## 2021-09-02 MED ORDER — ACETAMINOPHEN 500 MG PO TABS
1000.0000 mg | ORAL_TABLET | Freq: Once | ORAL | Status: AC
  Administered 2021-09-02: 1000 mg via ORAL
  Filled 2021-09-02: qty 2

## 2021-09-02 NOTE — Discharge Instructions (Addendum)
Hold your next dose of Xarelto.  Apply ice to forehead to help with the swelling.

## 2021-09-02 NOTE — ED Notes (Signed)
Patient transported to CT 

## 2021-09-02 NOTE — ED Provider Notes (Signed)
[facility] DEPT Provider Note   CSN: [id number] Arrival date & time: 09/02/21  1600     History  Chief Complaint  Patient presents with   [place] is a 88 y.o. female.  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  Pt is on Xarelto.  She leaned forward today and leaned too far.  She said she lost her balance and kept going forward.  Pt has been able to ambulate after the fall.  She denies loc.  Her neck is sore, but no other pain.       Home Medications Prior to Admission medications   Medication Sig Start Date End Date Taking? Authorizing Provider  amiodarone (PACERONE) 200 MG tablet Take 200 mg by mouth daily.    [provider]  cetirizine (ZYRTEC) 10 MG tablet Take 10 mg by mouth daily with supper.    [provider]  Cholecalciferol (VITAMIN D) 1000 UNITS capsule Take 1,000 Units by mouth daily with supper.    [provider]  clotrimazole-betamethasone (LOTRISONE) cream Apply 1 application topically 2 (two) times daily. Female parts 12/31/20   [provider]  Glucosamine HCl POWD by Does not apply route.    [provider]  Hypromellose (ARTIFICIAL TEARS OP) Place 1 drop into the left eye daily as needed (dry eye).    [provider]  Melatonin 10 MG TABS Take 10 mg by mouth daily in the afternoon. Supper and bedime    [provider]  metoprolol succinate (TOPROL XL) 25 MG 24 hr tablet Take 1 tablet (25 mg total) by mouth daily. 05/30/21 05/31/22  Donato Heinz, MD  Multiple Vitamins-Minerals (MULTIVITAMIN WITH MINERALS) tablet Take 1 tablet by mouth 2 (two) times daily.    [provider]  Multiple Vitamins-Minerals (PRESERVISION/LUTEIN PO) Take 1 capsule by mouth 2 (two) times daily.     [provider]  Polyethyl Glycol-Propyl  Glycol (SYSTANE OP) Place 1 drop into the right eye daily as needed (dry eye). gel    [provider]  polyethylene glycol (MIRALAX / GLYCOLAX) packet Take 17 g by mouth every evening.    [provider]  QUEtiapine (SEROQUEL) 25 MG tablet Take 25 mg by mouth at bedtime. 12/11/19   [provider]  sertraline (ZOLOFT) 100 MG tablet Take 100 mg by mouth at bedtime.    [provider]  XARELTO 15 MG TABS tablet TAKE ONE TABLET BY MOUTH ONCE DAILY WITH SUPPER 03/25/21   Donato Heinz, MD      Allergies    Shellfish allergy, Atropine, Codeine, Doxycycline, Erythromycin, Fenoprofen calcium, Iodinated contrast media, Isopto hyoscine [scopolamine], Levaquin [levofloxacin], Naproxen, Other, Oxycodone, Penicillins, Sulfa drugs cross reactors, Warfarin, Ceclor [cefaclor], and Tape    Review of Systems   Review of Systems  HENT:         Swelling around right eye  Musculoskeletal:  Positive for neck pain.  All other systems reviewed and are negative.   Physical Exam Updated Vital Signs BP (!) 149/66   Pulse 65   Temp 98.1 F (36.7 C) (Oral)   Resp 14   SpO2 97%  Physical Exam Vitals and nursing note reviewed.  HENT:     Head: Normocephalic.     Comments: Bruising around right eye    Nose: Nose normal.     Mouth/Throat:  Mouth: Mucous membranes are moist.  Eyes:     Comments: Left eye nl Right eye s/p enucleation (prosthesis at home)  Cardiovascular:     Rate and Rhythm: Normal rate and regular rhythm.     Pulses: Normal pulses.     Heart sounds: Normal heart sounds.  Pulmonary:     Effort: Pulmonary effort is normal.     Breath sounds: Normal breath sounds.  Abdominal:     General: Abdomen is flat. Bowel sounds are normal.     Palpations: Abdomen is soft.  Musculoskeletal:        General: Normal range of motion.     Cervical back: Normal range of motion. Muscular tenderness present.  Skin:    General: Skin is warm.     Capillary  Refill: Capillary refill takes less than 2 seconds.  Neurological:     General: No focal deficit present.     Mental Status: She is alert and oriented to person, place, and time.  Psychiatric:        Mood and Affect: Mood normal.        Behavior: Behavior normal.     ED Results / Procedures / Treatments   Labs (all labs ordered are listed, but only abnormal results are displayed) Labs Reviewed  BASIC METABOLIC PANEL - Abnormal; Notable for the following components:      Result Value   Glucose, Bld 108 (*)    Creatinine, Ser 1.02 (*)    GFR, Estimated 53 (*)    Anion gap 4 (*)    All other components within normal limits  CBC WITH DIFFERENTIAL/PLATELET - Abnormal; Notable for the following components:   RBC 2.08 (*)    Hemoglobin 7.4 (*)    HCT 23.3 (*)    MCV 112.0 (*)    MCH 35.6 (*)    RDW 24.8 (*)    All other components within normal limits    EKG EKG Interpretation  Date/Time:  Monday September 02 2021 16:29:15 EDT Ventricular Rate:  60 PR Interval:  166 QRS Duration: 139 QT Interval:  451 QTC Calculation: 451 R Axis:   46 Text Interpretation: Ectopic atrial rhythm Right bundle branch block Nonspecific T abnormalities, lateral leads No significant change since last tracing Confirmed by Isla Pence [id number]) on 09/02/2021 5:52:11 PM  Radiology CT Head Wo Contrast  Result Date: 09/02/2021 CLINICAL DATA:  Fall.  Bruising RIGHT eye. EXAM: CT HEAD WITHOUT CONTRAST CT MAXILLOFACIAL WITHOUT CONTRAST CT CERVICAL SPINE WITHOUT CONTRAST TECHNIQUE: Multidetector CT imaging of the head, cervical spine, and maxillofacial structures were performed using the standard protocol without intravenous contrast. Multiplanar CT image reconstructions of the cervical spine and maxillofacial structures were also generated. RADIATION DOSE REDUCTION: This exam was performed according to the departmental dose-optimization program which includes automated exposure control, adjustment of the mA  and/or kV according to patient size and/or use of iterative reconstruction technique. COMPARISON:  None Available. FINDINGS: CT HEAD FINDINGS Brain: No acute intracranial hemorrhage. No focal mass lesion. No CT evidence of acute infarction. No midline shift or mass effect. No hydrocephalus. Basilar cisterns are patent. There are periventricular and subcortical white matter hypodensities. Generalized cortical atrophy. Vascular: No hyperdense vessel or unexpected calcification. Skull: Normal. Negative for fracture or focal lesion. Sinuses/Orbits: Paranasal sinuses and mastoid air cells are clear. Orbits are clear. ENucleation RIGHT globe Other: None. CT MAXILLOFACIAL FINDINGS Osseous: No fracture or mandibular dislocation. No destructive process. Orbits: Mild preseptal swelling on the RIGHT. No proptosis. Enucleation RIGHT  globe. Intraconal contents are normal otherwise. No orbital fracture. Sinuses: Clear. Soft tissues: Mild preseptal swelling on the RIGHT as above. CT CERVICAL SPINE FINDINGS Alignment: Normal alignment of the cervical vertebral bodies. Skull base and vertebrae: Normal craniocervical junction. No loss of vertebral body height or disc height. Normal facet articulation. No evidence of fracture. Soft tissues and spinal canal: No prevertebral soft tissue swelling. No perispinal or epidural hematoma. Disc levels: Endplate spurring Z6-X0. Just disc space narrowing at C5-C6. No acute findings Upper chest: Clear Other: None IMPRESSION: 1. No acute intracranial findings. 2. No facial bone fracture. 3. Mild preseptal swelling on the RIGHT.  No orbital fracture. 4. No cervical spine fracture. 5. Multi level disc osteophytic disease. Electronically Signed   By: Suzy Bouchard M.D.   On: 09/02/2021 17:44   CT Cervical Spine Wo Contrast  Result Date: 09/02/2021 CLINICAL DATA:  Fall.  Bruising RIGHT eye. EXAM: CT HEAD WITHOUT CONTRAST CT MAXILLOFACIAL WITHOUT CONTRAST CT CERVICAL SPINE WITHOUT CONTRAST  TECHNIQUE: Multidetector CT imaging of the head, cervical spine, and maxillofacial structures were performed using the standard protocol without intravenous contrast. Multiplanar CT image reconstructions of the cervical spine and maxillofacial structures were also generated. RADIATION DOSE REDUCTION: This exam was performed according to the departmental dose-optimization program which includes automated exposure control, adjustment of the mA and/or kV according to patient size and/or use of iterative reconstruction technique. COMPARISON:  None Available. FINDINGS: CT HEAD FINDINGS Brain: No acute intracranial hemorrhage. No focal mass lesion. No CT evidence of acute infarction. No midline shift or mass effect. No hydrocephalus. Basilar cisterns are patent. There are periventricular and subcortical white matter hypodensities. Generalized cortical atrophy. Vascular: No hyperdense vessel or unexpected calcification. Skull: Normal. Negative for fracture or focal lesion. Sinuses/Orbits: Paranasal sinuses and mastoid air cells are clear. Orbits are clear. ENucleation RIGHT globe Other: None. CT MAXILLOFACIAL FINDINGS Osseous: No fracture or mandibular dislocation. No destructive process. Orbits: Mild preseptal swelling on the RIGHT. No proptosis. Enucleation RIGHT globe. Intraconal contents are normal otherwise. No orbital fracture. Sinuses: Clear. Soft tissues: Mild preseptal swelling on the RIGHT as above. CT CERVICAL SPINE FINDINGS Alignment: Normal alignment of the cervical vertebral bodies. Skull base and vertebrae: Normal craniocervical junction. No loss of vertebral body height or disc height. Normal facet articulation. No evidence of fracture. Soft tissues and spinal canal: No prevertebral soft tissue swelling. No perispinal or epidural hematoma. Disc levels: Endplate spurring R6-E4. Just disc space narrowing at C5-C6. No acute findings Upper chest: Clear Other: None IMPRESSION: 1. No acute intracranial findings.  2. No facial bone fracture. 3. Mild preseptal swelling on the RIGHT.  No orbital fracture. 4. No cervical spine fracture. 5. Multi level disc osteophytic disease. Electronically Signed   By: Suzy Bouchard M.D.   On: 09/02/2021 17:44   CT Maxillofacial Wo Contrast  Result Date: 09/02/2021 CLINICAL DATA:  Fall.  Bruising RIGHT eye. EXAM: CT HEAD WITHOUT CONTRAST CT MAXILLOFACIAL WITHOUT CONTRAST CT CERVICAL SPINE WITHOUT CONTRAST TECHNIQUE: Multidetector CT imaging of the head, cervical spine, and maxillofacial structures were performed using the standard protocol without intravenous contrast. Multiplanar CT image reconstructions of the cervical spine and maxillofacial structures were also generated. RADIATION DOSE REDUCTION: This exam was performed according to the departmental dose-optimization program which includes automated exposure control, adjustment of the mA and/or kV according to patient size and/or use of iterative reconstruction technique. COMPARISON:  None Available. FINDINGS: CT HEAD FINDINGS Brain: No acute intracranial hemorrhage. No focal mass lesion. No CT evidence  of acute infarction. No midline shift or mass effect. No hydrocephalus. Basilar cisterns are patent. There are periventricular and subcortical white matter hypodensities. Generalized cortical atrophy. Vascular: No hyperdense vessel or unexpected calcification. Skull: Normal. Negative for fracture or focal lesion. Sinuses/Orbits: Paranasal sinuses and mastoid air cells are clear. Orbits are clear. ENucleation RIGHT globe Other: None. CT MAXILLOFACIAL FINDINGS Osseous: No fracture or mandibular dislocation. No destructive process. Orbits: Mild preseptal swelling on the RIGHT. No proptosis. Enucleation RIGHT globe. Intraconal contents are normal otherwise. No orbital fracture. Sinuses: Clear. Soft tissues: Mild preseptal swelling on the RIGHT as above. CT CERVICAL SPINE FINDINGS Alignment: Normal alignment of the cervical vertebral  bodies. Skull base and vertebrae: Normal craniocervical junction. No loss of vertebral body height or disc height. Normal facet articulation. No evidence of fracture. Soft tissues and spinal canal: No prevertebral soft tissue swelling. No perispinal or epidural hematoma. Disc levels: Endplate spurring A3-F5. Just disc space narrowing at C5-C6. No acute findings Upper chest: Clear Other: None IMPRESSION: 1. No acute intracranial findings. 2. No facial bone fracture. 3. Mild preseptal swelling on the RIGHT.  No orbital fracture. 4. No cervical spine fracture. 5. Multi level disc osteophytic disease. Electronically Signed   By: Suzy Bouchard M.D.   On: 09/02/2021 17:44    Procedures .[name] KitchenLaceration Repair  Date/Time: 09/02/2021 6:19 PM  Performed by: Isla Pence, MD Authorized by: Isla Pence, MD   Consent:    Consent obtained:  Verbal   Consent given by:  Patient Universal protocol:    Patient identity confirmed:  Verbally with patient Anesthesia:    Anesthesia method:  None Laceration details:    Location:  Face   Face location:  R eyebrow   Length (cm):  1 Pre-procedure details:    Preparation:  Patient was prepped and draped in usual sterile fashion Exploration:    Contaminated: no   Treatment:    Area cleansed with:  Saline Skin repair:    Repair method:  Tissue adhesive Approximation:    Approximation:  Close Repair type:    Repair type:  Simple Post-procedure details:    Dressing:  Open (no dressing)   Procedure completion:  Tolerated well, no immediate complications     Medications Ordered in ED Medications  acetaminophen (TYLENOL) tablet 1,000 mg (1,000 mg Oral Given 09/02/21 1759)    ED Course/ Medical Decision Making/ A&P                           Medical Decision Making Amount and/or Complexity of Data Reviewed Radiology: ordered.  Risk OTC drugs.   This patient presents to the ED for concern of fall, this involves an extensive number of treatment  options, and is a complaint that carries with it a high risk of complications and morbidity.  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   Additional history obtained:  Additional history obtained from epic chart review External records from outside source obtained and reviewed including family   Lab Tests:  I Ordered, and personally interpreted labs.  The pertinent results include:  cbc with chronic anemia (hgb 7.4), bmp nl   Imaging Studies ordered:  I ordered imaging studies including ct head, ct c-spine, ct max/face  I independently visualized and interpreted imaging which showed  CT head/c-spine/face IMPRESSION:  1. No acute intracranial  findings.  2. No facial bone fracture.  3. Mild preseptal swelling on the RIGHT.  No orbital fracture.  4. No cervical spine fracture.  5. Multi level disc osteophytic disease.   I agree with the radiologist interpretation   Cardiac Monitoring:  The patient was maintained on a cardiac monitor.  I personally viewed and interpreted the cardiac monitored which showed an underlying rhythm of: nsr   Medicines ordered and prescription drug management:  I ordered medication including tylenol  for pain  Reevaluation of the patient after these medicines showed that the patient improved I have reviewed the patients home medicines and have made adjustments as needed   Test Considered:  ct   Critical Interventions:  ct   Problem List / ED Course:  Fall with eyebrow lac:  no internal injuries.  Pt is able to ambulate.  Pt is stable for d/c.  She is to hold her next dose of Xarelto.  Return if worse.  F/u with pcp.   Reevaluation:  After the interventions noted above, I reevaluated the patient and found that they have :improved   Social Determinants of  Health:  Lives at home   Dispostion:  After consideration of the diagnostic results and the patients response to treatment, I feel that the patent would benefit from d/c with outpatient f/u.          Final Clinical Impression(s) / ED Diagnoses Final diagnoses:  Minor head injury, initial encounter  Facial laceration, initial encounter    Rx / DC Orders ED Discharge Orders     None         Isla Pence, MD 09/02/21 [phone]

## 2021-09-02 NOTE — ED Provider Triage Note (Signed)
Emergency Medicine Provider Triage Evaluation Note  Megan Hester , a 88 y.o. female  was evaluated in triage.  Pt complains of fall while on Xarelto.  Patient states that she was trying to rub something off one of her walls, and the next thing she knew she had fallen forward, landing on the right side of her head.  She denies loss of consciousness.  She has no other injuries although she does note that her neck is a little bit sore.  She does have a prosthesis of the right eye that is not currently in.  Review of Systems  Positive:  Negative:   Physical Exam  BP 127/80 (BP Location: Left Arm)   Pulse 62   Temp 98.1 F (36.7 C) (Oral)   Resp 18   SpO2 98%  Gen:   Awake, no distress   Resp:  Normal effort  MSK:   Moves extremities without difficulty  Other:  Bruising and small abrasion noted to the right eyebrow  Medical Decision Making  Medically screening exam initiated at 4:18 PM.  Appropriate orders placed.  Megan Hester was informed that the remainder of the evaluation will be completed by another provider, this initial triage assessment does not replace that evaluation, and the importance of remaining in the ED until their evaluation is complete.     Tonye Pearson, [place] 09/02/21 [phone]

## 2021-09-02 NOTE — ED Triage Notes (Signed)
Pt reports falling earlier today and hitting head head and eye. Pt now has bruising to right eye and right forehead. Pt was bleeding, but bleeding controlled at this time. Pt takes Xarelto. Denies LOC and confusion.

## 2021-09-02 NOTE — ED Notes (Addendum)
Pt ambulated in room and hallway w/ hand held assist. Pt steady gait. Pt c/o feeling slightly lightheaded. No c/o pain or sob.

## 2021-09-03 ENCOUNTER — Other Ambulatory Visit: Payer: Medicare Other

## 2021-09-03 ENCOUNTER — Ambulatory Visit: Payer: Medicare Other | Admitting: Internal Medicine

## 2021-09-06 ENCOUNTER — Other Ambulatory Visit: Payer: Self-pay | Admitting: Physician Assistant

## 2021-09-09 ENCOUNTER — Ambulatory Visit: Payer: Medicare Other | Admitting: Internal Medicine

## 2021-09-09 ENCOUNTER — Other Ambulatory Visit: Payer: Medicare Other

## 2021-09-11 ENCOUNTER — Other Ambulatory Visit: Payer: Self-pay

## 2021-09-11 ENCOUNTER — Inpatient Hospital Stay: Payer: Medicare Other | Attending: Internal Medicine

## 2021-09-11 ENCOUNTER — Inpatient Hospital Stay: Payer: Medicare Other | Admitting: Internal Medicine

## 2021-09-11 VITALS — BP 94/48 | HR 62 | Temp 98.4°F | Resp 15 | Wt 140.1 lb

## 2021-09-11 DIAGNOSIS — D469 Myelodysplastic syndrome, unspecified: Secondary | ICD-10-CM

## 2021-09-11 DIAGNOSIS — E538 Deficiency of other specified B group vitamins: Secondary | ICD-10-CM | POA: Insufficient documentation

## 2021-09-11 DIAGNOSIS — D461 Refractory anemia with ring sideroblasts: Secondary | ICD-10-CM | POA: Diagnosis present

## 2021-09-11 DIAGNOSIS — D539 Nutritional anemia, unspecified: Secondary | ICD-10-CM

## 2021-09-11 LAB — CBC WITH DIFFERENTIAL (CANCER CENTER ONLY)
Abs Immature Granulocytes: 0.01 10*3/uL (ref 0.00–0.07)
Basophils Absolute: 0 10*3/uL (ref 0.0–0.1)
Basophils Relative: 1 %
Eosinophils Absolute: 0.3 10*3/uL (ref 0.0–0.5)
Eosinophils Relative: 5 %
HCT: 21.6 % — ABNORMAL LOW (ref 36.0–46.0)
Hemoglobin: 7.3 g/dL — ABNORMAL LOW (ref 12.0–15.0)
Immature Granulocytes: 0 %
Lymphocytes Relative: 27 %
Lymphs Abs: 1.4 10*3/uL (ref 0.7–4.0)
MCH: 37.1 pg — ABNORMAL HIGH (ref 26.0–34.0)
MCHC: 33.8 g/dL (ref 30.0–36.0)
MCV: 109.6 fL — ABNORMAL HIGH (ref 80.0–100.0)
Monocytes Absolute: 0.5 10*3/uL (ref 0.1–1.0)
Monocytes Relative: 9 %
Neutro Abs: 3 10*3/uL (ref 1.7–7.7)
Neutrophils Relative %: 58 %
Platelet Count: 245 10*3/uL (ref 150–400)
RBC: 1.97 MIL/uL — ABNORMAL LOW (ref 3.87–5.11)
RDW: 24.1 % — ABNORMAL HIGH (ref 11.5–15.5)
WBC Count: 5.2 10*3/uL (ref 4.0–10.5)
nRBC: 1 % — ABNORMAL HIGH (ref 0.0–0.2)

## 2021-09-11 LAB — IRON AND IRON BINDING CAPACITY (CC-WL,HP ONLY)
Iron: 157 ug/dL (ref 28–170)
Saturation Ratios: 71 % — ABNORMAL HIGH (ref 10.4–31.8)
TIBC: 222 ug/dL — ABNORMAL LOW (ref 250–450)
UIBC: 65 ug/dL

## 2021-09-11 LAB — SAMPLE TO BLOOD BANK

## 2021-09-11 LAB — PREPARE RBC (CROSSMATCH)

## 2021-09-11 LAB — FERRITIN: Ferritin: 485 ng/mL — ABNORMAL HIGH (ref 11–307)

## 2021-09-11 NOTE — Progress Notes (Signed)
[facility] [phone]   [phone]  OFFICE PROGRESS NOTE  [name], MD [place] [address]  DIAGNOSIS:  Low-grade myelodysplastic syndrome with ring sideroblasts.  There was also an element of vitamin B12 deficiency after small bowel resection for an obstruction in December 2020.  PRIOR THERAPY:None  CURRENT THERAPY:  1) Vitamin B12 injection initially weekly then every 2 weeks and then monthly. 2) supportive care with transfusion on as-needed basis  INTERVAL HISTORY: Megan Hester 88 y.o. female returns to the clinic today for follow-up visit accompanied by her daughter-in-law.  The patient continues to complain of the generalized fatigue and weakness.  She has a fall and hit her right side of the face above her eye recently.  She has some ecchymosis in that area.  She denied having any other bleeding, bruises or ecchymosis.  She denied having any chest pain but has shortness of breath with exertion with no cough or hemoptysis.  She denied having any fever or chills.  She has no recent weight loss or night sweats.  The patient is here today for evaluation and repeat blood work.  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  MEDICATIONS:  Current Outpatient  Medications  Medication Sig Dispense Refill   amiodarone (PACERONE) 200 MG tablet Take 1 tablet (200 mg total) by mouth 2 (two) times daily. (Patient taking differently: Take 200 mg by mouth daily.) 180 tablet 1   cetirizine (ZYRTEC) 10 MG tablet Take 10 mg by mouth daily with supper.     Cholecalciferol (VITAMIN D) 1000 UNITS capsule Take 1,000 Units by mouth daily with supper.     clotrimazole-betamethasone (LOTRISONE) cream Apply 1 application topically 2 (two) times daily. Female parts     Glucosamine HCl POWD by Does not apply route.     Hypromellose (ARTIFICIAL TEARS OP) Place 1 drop into the left eye daily as needed (dry eye).     Melatonin 10 MG TABS Take 10 mg by mouth daily in the afternoon. Supper and bedime     metoprolol succinate (TOPROL XL) 25 MG 24 hr tablet Take 1 tablet (25 mg total) by mouth daily. 90 tablet 3   Multiple Vitamins-Minerals (MULTIVITAMIN WITH MINERALS) tablet Take 1 tablet by mouth 2 (two) times daily.     Multiple Vitamins-Minerals (PRESERVISION/LUTEIN PO) Take 1 capsule by mouth 2 (two) times daily.      Polyethyl Glycol-Propyl Glycol (SYSTANE OP) Place 1 drop into the right eye daily as needed (dry eye). gel     polyethylene glycol (MIRALAX / GLYCOLAX) packet Take 17 g by mouth every evening.     QUEtiapine (SEROQUEL) 25 MG tablet Take 25 mg by mouth at bedtime.     sertraline (ZOLOFT) 100 MG tablet Take 100 mg by mouth at bedtime.  XARELTO 15 MG TABS tablet TAKE ONE TABLET BY MOUTH ONCE DAILY WITH SUPPER 30 tablet 5   No current facility-administered medications for this visit.    SURGICAL HISTORY:  Past Surgical History:  Procedure Laterality Date   APPLICATION OF WOUND VAC Bilateral 02/01/2019   Procedure: APPLICATION OF WOUND VAC;  Surgeon: [name], MD;  Location: [facility];  Service: General;  Laterality: Bilateral;   BREAST BIOPSY Right    BREAST LUMPECTOMY Right 1999   RADIATION   CARDIAC CATHETERIZATION  02/05/1999   HYPERDYNAMIC LEFT  VENTRICLE WITH EF OF 75-80%   CARDIOVERSION N/A 01/22/2015   Procedure: CARDIOVERSION;  Surgeon: [name], MD;  Location: [facility];  Service: Cardiovascular;  Laterality: N/A;   CARDIOVERSION N/A 02/22/2015   Procedure: CARDIOVERSION;  Surgeon: [name], MD;  Location: [facility];  Service: Cardiovascular;  Laterality: N/A;   DILATION AND CURETTAGE OF UTERUS     ENUCLEATION Right 2012   EP IMPLANTABLE DEVICE N/A 02/25/2016   Procedure: Pacemaker Implant;  Surgeon: [name], MD;  Location: [facility];  Service: Cardiovascular;  Laterality: N/A;   INSERT / REPLACE / REMOVE PACEMAKER     LAPAROSCOPIC SMALL BOWEL RESECTION N/A 02/01/2019   Procedure: LAPAROSCOPIC SMALL BOWEL RESECTION WITH TAP BLOCK;  Surgeon: [name], MD;  Location: [facility];  Service: General;  Laterality: N/A;   LAPAROSCOPY N/A 02/01/2019   Procedure: LAPAROSCOPY DIAGNOSTIC;  Surgeon: [name], MD;  Location: [facility];  Service: General;  Laterality: N/A;   LYSIS OF ADHESION N/A 02/01/2019   Procedure: LYSIS OF ADHESION;  Surgeon: [name], MD;  Location: [facility];  Service: General;  Laterality: N/A;   TONSILLECTOMY      REVIEW OF SYSTEMS:  A comprehensive review of systems was negative except for: Constitutional: positive for fatigue Respiratory: positive for dyspnea on exertion Neurological: positive for dizziness   PHYSICAL EXAMINATION: General appearance: alert, cooperative, fatigued, and no distress Head: Normocephalic, without obvious abnormality, atraumatic Neck: no adenopathy, no JVD, supple, symmetrical, trachea midline, and thyroid not enlarged, symmetric, no tenderness/mass/nodules Lymph nodes: Cervical, supraclavicular, and axillary nodes normal. Resp: clear to auscultation bilaterally Back: symmetric, no curvature. ROM normal. No CVA tenderness. Cardio: regular rate and rhythm, S1, S2 normal, no murmur, click, rub or gallop GI: soft, non-tender; bowel sounds  normal; no masses,  no organomegaly Extremities: extremities normal, atraumatic, no cyanosis or edema  ECOG PERFORMANCE STATUS: 1 - Symptomatic but completely ambulatory  Blood pressure (!) 94/48, pulse 62, temperature 98.4 F (36.9 C), resp. rate 15, weight 140 lb 2 oz (63.6 kg), SpO2 99 %.  LABORATORY DATA: Lab Results  Component Value Date   WBC 5.2 09/11/2021   HGB 7.3 (L) 09/11/2021   HCT 21.6 (L) 09/11/2021   MCV 109.6 (H) 09/11/2021   PLT 245 09/11/2021      Chemistry      Component Value Date/Time   NA 137 09/02/2021 1716   NA 139 02/22/2021 1145   K 4.1 09/02/2021 1716   CL 110 09/02/2021 1716   CO2 23 09/02/2021 1716   BUN 19 09/02/2021 1716   BUN 25 02/22/2021 1145   CREATININE 1.02 (H) 09/02/2021 1716   CREATININE 1.02 (H) 08/28/2020 1123      Component Value Date/Time   CALCIUM 9.2 09/02/2021 1716   ALKPHOS 123 (H) 02/22/2021 1145   AST 19 02/22/2021 1145   AST 13 (L) 08/28/2020 1123   ALT 12 02/22/2021 1145   ALT 7  08/28/2020 1123   BILITOT 0.5 02/22/2021 1145   BILITOT 0.8 08/28/2020 1123       RADIOGRAPHIC STUDIES: CT Head Wo Contrast  Result Date: 09/02/2021 CLINICAL DATA:  Fall.  Bruising RIGHT eye. EXAM: CT HEAD WITHOUT CONTRAST CT MAXILLOFACIAL WITHOUT CONTRAST CT CERVICAL SPINE WITHOUT CONTRAST TECHNIQUE: Multidetector CT imaging of the head, cervical spine, and maxillofacial structures were performed using the standard protocol without intravenous contrast. Multiplanar CT image reconstructions of the cervical spine and maxillofacial structures were also generated. RADIATION DOSE REDUCTION: This exam was performed according to the departmental dose-optimization program which includes automated exposure control, adjustment of the mA and/or kV according to patient size and/or use of iterative reconstruction technique. COMPARISON:  None Available. FINDINGS: CT HEAD FINDINGS Brain: No acute intracranial hemorrhage. No focal mass lesion. No CT evidence  of acute infarction. No midline shift or mass effect. No hydrocephalus. Basilar cisterns are patent. There are periventricular and subcortical white matter hypodensities. Generalized cortical atrophy. Vascular: No hyperdense vessel or unexpected calcification. Skull: Normal. Negative for fracture or focal lesion. Sinuses/Orbits: Paranasal sinuses and mastoid air cells are clear. Orbits are clear. ENucleation RIGHT globe Other: None. CT MAXILLOFACIAL FINDINGS Osseous: No fracture or mandibular dislocation. No destructive process. Orbits: Mild preseptal swelling on the RIGHT. No proptosis. Enucleation RIGHT globe. Intraconal contents are normal otherwise. No orbital fracture. Sinuses: Clear. Soft tissues: Mild preseptal swelling on the RIGHT as above. CT CERVICAL SPINE FINDINGS Alignment: Normal alignment of the cervical vertebral bodies. Skull base and vertebrae: Normal craniocervical junction. No loss of vertebral body height or disc height. Normal facet articulation. No evidence of fracture. Soft tissues and spinal canal: No prevertebral soft tissue swelling. No perispinal or epidural hematoma. Disc levels: Endplate spurring M1-D6. Just disc space narrowing at C5-C6. No acute findings Upper chest: Clear Other: None IMPRESSION: 1. No acute intracranial findings. 2. No facial bone fracture. 3. Mild preseptal swelling on the RIGHT.  No orbital fracture. 4. No cervical spine fracture. 5. Multi level disc osteophytic disease. Electronically Signed   By: Suzy Bouchard M.D.   On: 09/02/2021 17:44   CT Cervical Spine Wo Contrast  Result Date: 09/02/2021 CLINICAL DATA:  Fall.  Bruising RIGHT eye. EXAM: CT HEAD WITHOUT CONTRAST CT MAXILLOFACIAL WITHOUT CONTRAST CT CERVICAL SPINE WITHOUT CONTRAST TECHNIQUE: Multidetector CT imaging of the head, cervical spine, and maxillofacial structures were performed using the standard protocol without intravenous contrast. Multiplanar CT image reconstructions of the cervical spine  and maxillofacial structures were also generated. RADIATION DOSE REDUCTION: This exam was performed according to the departmental dose-optimization program which includes automated exposure control, adjustment of the mA and/or kV according to patient size and/or use of iterative reconstruction technique. COMPARISON:  None Available. FINDINGS: CT HEAD FINDINGS Brain: No acute intracranial hemorrhage. No focal mass lesion. No CT evidence of acute infarction. No midline shift or mass effect. No hydrocephalus. Basilar cisterns are patent. There are periventricular and subcortical white matter hypodensities. Generalized cortical atrophy. Vascular: No hyperdense vessel or unexpected calcification. Skull: Normal. Negative for fracture or focal lesion. Sinuses/Orbits: Paranasal sinuses and mastoid air cells are clear. Orbits are clear. ENucleation RIGHT globe Other: None. CT MAXILLOFACIAL FINDINGS Osseous: No fracture or mandibular dislocation. No destructive process. Orbits: Mild preseptal swelling on the RIGHT. No proptosis. Enucleation RIGHT globe. Intraconal contents are normal otherwise. No orbital fracture. Sinuses: Clear. Soft tissues: Mild preseptal swelling on the RIGHT as above. CT CERVICAL SPINE FINDINGS Alignment: Normal alignment of the cervical vertebral bodies. Skull base and  vertebrae: Normal craniocervical junction. No loss of vertebral body height or disc height. Normal facet articulation. No evidence of fracture. Soft tissues and spinal canal: No prevertebral soft tissue swelling. No perispinal or epidural hematoma. Disc levels: Endplate spurring Q0-H4. Just disc space narrowing at C5-C6. No acute findings Upper chest: Clear Other: None IMPRESSION: 1. No acute intracranial findings. 2. No facial bone fracture. 3. Mild preseptal swelling on the RIGHT.  No orbital fracture. 4. No cervical spine fracture. 5. Multi level disc osteophytic disease. Electronically Signed   By: Suzy Bouchard M.D.   On:  09/02/2021 17:44   CT Maxillofacial Wo Contrast  Result Date: 09/02/2021 CLINICAL DATA:  Fall.  Bruising RIGHT eye. EXAM: CT HEAD WITHOUT CONTRAST CT MAXILLOFACIAL WITHOUT CONTRAST CT CERVICAL SPINE WITHOUT CONTRAST TECHNIQUE: Multidetector CT imaging of the head, cervical spine, and maxillofacial structures were performed using the standard protocol without intravenous contrast. Multiplanar CT image reconstructions of the cervical spine and maxillofacial structures were also generated. RADIATION DOSE REDUCTION: This exam was performed according to the departmental dose-optimization program which includes automated exposure control, adjustment of the mA and/or kV according to patient size and/or use of iterative reconstruction technique. COMPARISON:  None Available. FINDINGS: CT HEAD FINDINGS Brain: No acute intracranial hemorrhage. No focal mass lesion. No CT evidence of acute infarction. No midline shift or mass effect. No hydrocephalus. Basilar cisterns are patent. There are periventricular and subcortical white matter hypodensities. Generalized cortical atrophy. Vascular: No hyperdense vessel or unexpected calcification. Skull: Normal. Negative for fracture or focal lesion. Sinuses/Orbits: Paranasal sinuses and mastoid air cells are clear. Orbits are clear. ENucleation RIGHT globe Other: None. CT MAXILLOFACIAL FINDINGS Osseous: No fracture or mandibular dislocation. No destructive process. Orbits: Mild preseptal swelling on the RIGHT. No proptosis. Enucleation RIGHT globe. Intraconal contents are normal otherwise. No orbital fracture. Sinuses: Clear. Soft tissues: Mild preseptal swelling on the RIGHT as above. CT CERVICAL SPINE FINDINGS Alignment: Normal alignment of the cervical vertebral bodies. Skull base and vertebrae: Normal craniocervical junction. No loss of vertebral body height or disc height. Normal facet articulation. No evidence of fracture. Soft tissues and spinal canal: No prevertebral soft  tissue swelling. No perispinal or epidural hematoma. Disc levels: Endplate spurring V4-Q5. Just disc space narrowing at C5-C6. No acute findings Upper chest: Clear Other: None IMPRESSION: 1. No acute intracranial findings. 2. No facial bone fracture. 3. Mild preseptal swelling on the RIGHT.  No orbital fracture. 4. No cervical spine fracture. 5. Multi level disc osteophytic disease. Electronically Signed   By: Suzy Bouchard M.D.   On: 09/02/2021 17:44   CUP PACEART REMOTE DEVICE CHECK  Result Date: 08/27/2021 Scheduled remote reviewed. Normal device function.  Known PAF, burden 29.6%, previous 35% Xarelto, Metoprolol, and Amiodarone prescribed Next remote 91 days. [place]   ASSESSMENT AND PLAN: This is a very pleasant 88 years old white female with recently diagnosed low-grade myelodysplastic syndrome in addition to questionable vitamin B12 deficiency.   The patient had a bone marrow biopsy and aspirate performed recently.   The cytogenetics showed no 5 q. deletion. The patient is currently on observation and supportive therapy with transfusion on as-needed basis. She had repeat CBC, iron study and ferritin performed earlier today. CBC showed persistent anemia with hemoglobin of 7.3.  Iron study and ferritin are still pending. I recommended for the patient to proceed with PRBCs transfusion with 2 units in the next few days. She will continue with the vitamin B12 injection on monthly basis. I will see her  back for follow-up visit in 2 months for evaluation and repeat blood work. The patient was advised to call immediately if she has any other concerning symptoms in the interval.  The patient voices understanding of current disease status and treatment options and is in agreement with the current care plan.  All questions were answered. The patient knows to call the clinic with any problems, questions or concerns. We can certainly see the patient much sooner if necessary.   Disclaimer: This note  was dictated with voice recognition software. Similar sounding words can inadvertently be transcribed and may not be corrected upon review.

## 2021-09-12 ENCOUNTER — Inpatient Hospital Stay: Payer: Medicare Other

## 2021-09-12 DIAGNOSIS — D469 Myelodysplastic syndrome, unspecified: Secondary | ICD-10-CM

## 2021-09-12 DIAGNOSIS — D461 Refractory anemia with ring sideroblasts: Secondary | ICD-10-CM | POA: Diagnosis not present

## 2021-09-12 MED ORDER — DIPHENHYDRAMINE HCL 25 MG PO CAPS
25.0000 mg | ORAL_CAPSULE | Freq: Once | ORAL | Status: AC
  Administered 2021-09-12: 25 mg via ORAL
  Filled 2021-09-12: qty 1

## 2021-09-12 MED ORDER — SODIUM CHLORIDE 0.9 % IV SOLN: Freq: Once | INTRAVENOUS | Status: AC

## 2021-09-12 MED ORDER — ACETAMINOPHEN 325 MG PO TABS
650.0000 mg | ORAL_TABLET | Freq: Once | ORAL | Status: AC
  Administered 2021-09-12: 650 mg via ORAL
  Filled 2021-09-12: qty 2

## 2021-09-12 MED ORDER — SODIUM CHLORIDE 0.9% IV SOLUTION
250.0000 mL | Freq: Once | INTRAVENOUS | Status: AC
  Administered 2021-09-12: 250 mL via INTRAVENOUS

## 2021-09-12 NOTE — Patient Instructions (Signed)

## 2021-09-13 ENCOUNTER — Telehealth: Payer: Self-pay | Admitting: Internal Medicine

## 2021-09-13 LAB — BPAM RBC
Blood Product Expiration Date: 202308182359
Blood Product Expiration Date: 202308222359
ISSUE DATE / TIME: 202308031254
ISSUE DATE / TIME: 202308031254
Unit Type and Rh: 9500
Unit Type and Rh: 9500

## 2021-09-13 LAB — TYPE AND SCREEN
ABO/RH(D): O NEG
Antibody Screen: NEGATIVE
Unit division: 0
Unit division: 0

## 2021-09-13 NOTE — Telephone Encounter (Signed)
Scheduled per 08/02 los, patient has been called and voicemail was left regarding upcoming appointments.

## 2021-09-24 ENCOUNTER — Other Ambulatory Visit: Payer: Self-pay | Admitting: Cardiology

## 2021-09-24 NOTE — Telephone Encounter (Signed)
Prescription refill request for Xarelto received.  Indication:Afib Last office visit:4/23 Weight:63.6 kg Age:88 Scr:1.0 CrCl:39.04  Prescription refilled

## 2021-09-27 NOTE — Progress Notes (Signed)
Remote pacemaker transmission.   

## 2021-10-16 ENCOUNTER — Inpatient Hospital Stay: Payer: Medicare Other | Attending: Internal Medicine

## 2021-10-16 ENCOUNTER — Other Ambulatory Visit: Payer: Self-pay

## 2021-10-16 ENCOUNTER — Encounter: Payer: Self-pay | Admitting: Adult Health

## 2021-10-16 DIAGNOSIS — E538 Deficiency of other specified B group vitamins: Secondary | ICD-10-CM | POA: Insufficient documentation

## 2021-10-16 DIAGNOSIS — D539 Nutritional anemia, unspecified: Secondary | ICD-10-CM

## 2021-10-16 MED ORDER — CYANOCOBALAMIN 1000 MCG/ML IJ SOLN
1000.0000 ug | INTRAMUSCULAR | Status: DC
  Administered 2021-10-16: 1000 ug via INTRAMUSCULAR
  Filled 2021-10-16: qty 1

## 2021-10-16 NOTE — Progress Notes (Signed)
This encounter was created in error - please disregard.

## 2021-10-16 NOTE — Patient Instructions (Signed)
Vitamin B12 Deficiency Vitamin B12 deficiency means that your body does not have enough vitamin B12. The body needs this important vitamin: To make red blood cells. To make genes (DNA). To help the nerves work. If you do not have enough vitamin B12 in your body, you can have health problems, such as not having enough red blood cells in the blood (anemia). What are the causes? Not eating enough foods that contain vitamin B12. Not being able to take in (absorb) vitamin B12 from the food that you eat. Certain diseases. A condition in which the body does not make enough of a certain protein. This results in your body not taking in enough vitamin B12. Having a surgery in which part of the stomach or small intestine is taken out. Taking medicines that make it hard for the body to take in vitamin B12. These include: Heartburn medicines. Some medicines that are used to treat diabetes. What increases the risk? Being an older adult. Eating a vegetarian or vegan diet that does not include any foods that come from animals. Not eating enough foods that contain vitamin B12 while you are pregnant. Taking certain medicines. Having alcoholism. What are the signs or symptoms? In some cases, there are no symptoms. If the condition leads to too few blood cells or nerve damage, symptoms can occur, such as: Feeling weak or tired. Not being hungry. Losing feeling (numbness) or tingling in your hands and feet. Redness and burning of the tongue. Feeling sad (depressed). Confusion or memory problems. Trouble walking. If anemia is very bad, symptoms can include: Being short of breath. Being dizzy. Having a very fast heartbeat. How is this treated? Changing the way you eat and drink, such as: Eating more foods that contain vitamin B12. Drinking little or no alcohol. Getting vitamin B12 shots. Taking vitamin B12 supplements by mouth (orally). Your doctor will tell you the dose that is best for you. Follow  these instructions at home: Eating and drinking  Eat foods that come from animals and have a lot of vitamin B12 in them. These include: Meats and poultry. This includes beef, pork, chicken, turkey, and organ meats, such as liver. Seafood, such as clams, rainbow trout, salmon, tuna, and haddock. Eggs. Dairy foods such as milk, yogurt, and cheese. Eat breakfast cereals that have vitamin B12 added to them (are fortified). Check the label. The items listed above may not be a complete list of foods and beverages you can eat and drink. Contact a dietitian for more information. Alcohol use Do not drink alcohol if: Your doctor tells you not to drink. You are pregnant, may be pregnant, or are planning to become pregnant. If you drink alcohol: Limit how much you have to: 0-1 drink a day for women. 0-2 drinks a day for men. Know how much alcohol is in your drink. In the [place] one drink equals one 12 oz bottle of beer (355 mL), one 5 oz glass of wine (148 mL), or one 1 oz glass of hard liquor (44 mL). General instructions Get any vitamin B12 shots if told by your doctor. Take supplements only as told by your doctor. Follow the directions. Keep all follow-up visits. Contact a doctor if: Your symptoms come back. Your symptoms get worse or do not get better with treatment. Get help right away if: You have trouble breathing. You have a very fast heartbeat. You have chest pain. You get dizzy. You faint. These symptoms may be an emergency. Get help right away. Call 911.   Do not wait to see if the symptoms will go away. Do not drive yourself to the hospital. Summary Vitamin B12 deficiency means that your body is not getting enough of the vitamin. In some cases, there are no symptoms of this condition. Treatment may include making a change in the way you eat and drink, getting shots, or taking supplements. Eat foods that have vitamin B12 in them. This information is not intended to replace advice  given to you by your health care provider. Make sure you discuss any questions you have with your health care provider. Document Revised: 09/21/2020 Document Reviewed: 09/21/2020 Elsevier Patient Education  2023 [employer]  

## 2021-11-13 ENCOUNTER — Other Ambulatory Visit: Payer: Self-pay

## 2021-11-13 ENCOUNTER — Inpatient Hospital Stay: Payer: Medicare Other | Attending: Internal Medicine

## 2021-11-13 ENCOUNTER — Other Ambulatory Visit: Payer: Self-pay | Admitting: Medical Oncology

## 2021-11-13 ENCOUNTER — Inpatient Hospital Stay: Payer: Medicare Other

## 2021-11-13 ENCOUNTER — Inpatient Hospital Stay: Payer: Medicare Other | Admitting: Internal Medicine

## 2021-11-13 ENCOUNTER — Encounter: Payer: Self-pay | Admitting: Internal Medicine

## 2021-11-13 VITALS — BP 93/48 | HR 61 | Temp 98.2°F | Resp 16 | Ht 62.0 in | Wt 139.6 lb

## 2021-11-13 DIAGNOSIS — D469 Myelodysplastic syndrome, unspecified: Secondary | ICD-10-CM | POA: Insufficient documentation

## 2021-11-13 DIAGNOSIS — D539 Nutritional anemia, unspecified: Secondary | ICD-10-CM

## 2021-11-13 DIAGNOSIS — E538 Deficiency of other specified B group vitamins: Secondary | ICD-10-CM | POA: Diagnosis present

## 2021-11-13 LAB — CBC WITH DIFFERENTIAL (CANCER CENTER ONLY)
Abs Immature Granulocytes: 0.02 10*3/uL (ref 0.00–0.07)
Basophils Absolute: 0 10*3/uL (ref 0.0–0.1)
Basophils Relative: 0 %
Eosinophils Absolute: 0.2 10*3/uL (ref 0.0–0.5)
Eosinophils Relative: 4 %
HCT: 19.6 % — ABNORMAL LOW (ref 36.0–46.0)
Hemoglobin: 6.6 g/dL — CL (ref 12.0–15.0)
Immature Granulocytes: 0 %
Lymphocytes Relative: 27 %
Lymphs Abs: 1.2 10*3/uL (ref 0.7–4.0)
MCH: 37.5 pg — ABNORMAL HIGH (ref 26.0–34.0)
MCHC: 33.7 g/dL (ref 30.0–36.0)
MCV: 111.4 fL — ABNORMAL HIGH (ref 80.0–100.0)
Monocytes Absolute: 0.4 10*3/uL (ref 0.1–1.0)
Monocytes Relative: 8 %
Neutro Abs: 2.7 10*3/uL (ref 1.7–7.7)
Neutrophils Relative %: 61 %
Platelet Count: 181 10*3/uL (ref 150–400)
RBC: 1.76 MIL/uL — ABNORMAL LOW (ref 3.87–5.11)
RDW: 22.5 % — ABNORMAL HIGH (ref 11.5–15.5)
WBC Count: 4.6 10*3/uL (ref 4.0–10.5)
nRBC: 0.4 % — ABNORMAL HIGH (ref 0.0–0.2)

## 2021-11-13 LAB — IRON AND IRON BINDING CAPACITY (CC-WL,HP ONLY)
Iron: 151 ug/dL (ref 28–170)
Saturation Ratios: 71 % — ABNORMAL HIGH (ref 10.4–31.8)
TIBC: 214 ug/dL — ABNORMAL LOW (ref 250–450)
UIBC: 63 ug/dL

## 2021-11-13 LAB — SAMPLE TO BLOOD BANK

## 2021-11-13 LAB — PREPARE RBC (CROSSMATCH)

## 2021-11-13 LAB — FERRITIN: Ferritin: 638 ng/mL — ABNORMAL HIGH (ref 11–307)

## 2021-11-13 MED ORDER — CYANOCOBALAMIN 1000 MCG/ML IJ SOLN
1000.0000 ug | INTRAMUSCULAR | Status: DC
  Administered 2021-11-13: 1000 ug via INTRAMUSCULAR
  Filled 2021-11-13: qty 1

## 2021-11-13 NOTE — Progress Notes (Signed)
[facility] [phone]   [phone]  OFFICE PROGRESS NOTE  [name], MD [place] [address]  DIAGNOSIS:  Low-grade myelodysplastic syndrome with ring sideroblasts.  There was also an element of vitamin B12 deficiency after small bowel resection for an obstruction in December 2020.  PRIOR THERAPY:None  CURRENT THERAPY:  1) Vitamin B12 injection initially weekly then every 2 weeks and then monthly. 2) supportive care with transfusion on as-needed basis  INTERVAL HISTORY: Megan Hester 88 y.o. female returns to the clinic today for follow-up visit accompanied by her daughter-in-law.  The patient continues to complain of baseline fatigue worse with exertion especially when she goes upstairs.  She has no current chest pain, cough or hemoptysis.  She has no nausea, vomiting, diarrhea or constipation.  She has no headache or visual changes.  She has no recent weight loss or night sweats.  She is here today for evaluation and repeat blood work.  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  MEDICATIONS:  Current Outpatient Medications  Medication Sig Dispense Refill   amiodarone (PACERONE) 200 MG tablet Take 1 tablet (200 mg total) by mouth 2 (two) times daily. 180  tablet 1   cetirizine (ZYRTEC) 10 MG tablet Take 10 mg by mouth daily with supper.     Cholecalciferol (VITAMIN D) 1000 UNITS capsule Take 1,000 Units by mouth daily with supper.     clotrimazole-betamethasone (LOTRISONE) cream Apply 1 application topically 2 (two) times daily. Female parts     Glucosamine HCl POWD by Does not apply route.     Hypromellose (ARTIFICIAL TEARS OP) Place 1 drop into the left eye daily as needed (dry eye).     Melatonin 10 MG TABS Take 10 mg by mouth daily in the afternoon. Supper and bedime     metoprolol succinate (TOPROL XL) 25 MG 24 hr tablet Take 1 tablet (25 mg total) by mouth daily. 90 tablet 3   Multiple Vitamins-Minerals (MULTIVITAMIN WITH MINERALS) tablet Take 1 tablet by mouth 2 (two) times daily.     Multiple Vitamins-Minerals (PRESERVISION/LUTEIN PO) Take 1 capsule by mouth 2 (two) times daily.      Polyethyl Glycol-Propyl Glycol (SYSTANE OP) Place 1 drop into the right eye daily as needed (dry eye). gel     polyethylene glycol (MIRALAX / GLYCOLAX) packet Take 17 g by mouth every evening.     QUEtiapine (SEROQUEL) 25 MG tablet Take 25 mg by mouth at bedtime.     sertraline (ZOLOFT) 100 MG tablet Take 100 mg by mouth at bedtime.     XARELTO 15 MG TABS tablet TAKE ONE TABLET BY MOUTH ONCE DAILY WITH SUPPER 30 tablet 5   No current facility-administered medications for this visit.    SURGICAL HISTORY:  Past Surgical History:  Procedure Laterality Date  APPLICATION OF WOUND VAC Bilateral 02/01/2019   Procedure: APPLICATION OF WOUND VAC;  Surgeon: [name], MD;  Location: [facility];  Service: General;  Laterality: Bilateral;   BREAST BIOPSY Right    BREAST LUMPECTOMY Right 1999   RADIATION   CARDIAC CATHETERIZATION  02/05/1999   HYPERDYNAMIC LEFT VENTRICLE WITH EF OF 75-80%   CARDIOVERSION N/A 01/22/2015   Procedure: CARDIOVERSION;  Surgeon: [name], MD;  Location: [facility];  Service: Cardiovascular;  Laterality: N/A;    CARDIOVERSION N/A 02/22/2015   Procedure: CARDIOVERSION;  Surgeon: [name], MD;  Location: [facility];  Service: Cardiovascular;  Laterality: N/A;   DILATION AND CURETTAGE OF UTERUS     ENUCLEATION Right 2012   EP IMPLANTABLE DEVICE N/A 02/25/2016   Procedure: Pacemaker Implant;  Surgeon: [name], MD;  Location: [facility];  Service: Cardiovascular;  Laterality: N/A;   INSERT / REPLACE / REMOVE PACEMAKER     LAPAROSCOPIC SMALL BOWEL RESECTION N/A 02/01/2019   Procedure: LAPAROSCOPIC SMALL BOWEL RESECTION WITH TAP BLOCK;  Surgeon: [name], MD;  [facility];  Service: General;  Laterality: N/A;   LAPAROSCOPY N/A 02/01/2019   Procedure: LAPAROSCOPY DIAGNOSTIC;  Surgeon: [name], MD;  [facility];  Service: General;  Laterality: N/A;   LYSIS OF ADHESION N/A 02/01/2019   Procedure: LYSIS OF ADHESION;  Surgeon: [name], MD;  [facility];  Service: General;  Laterality: N/A;   TONSILLECTOMY      REVIEW OF SYSTEMS:  A comprehensive review of systems was negative except for: Constitutional: positive for fatigue Respiratory: positive for dyspnea on exertion   PHYSICAL EXAMINATION: General appearance: alert, cooperative, fatigued, and no distress Head: Normocephalic, without obvious abnormality, atraumatic Neck: no adenopathy, no JVD, supple, symmetrical, trachea midline, and thyroid not enlarged, symmetric, no tenderness/mass/nodules Lymph nodes: Cervical, supraclavicular, and axillary nodes normal. Resp: clear to auscultation bilaterally Back: symmetric, no curvature. ROM normal. No CVA tenderness. Cardio: regular rate and rhythm, S1, S2 normal, no murmur, click, rub or gallop GI: soft, non-tender; bowel sounds normal; no masses,  no organomegaly Extremities: extremities normal, atraumatic, no cyanosis or edema  ECOG PERFORMANCE STATUS: 1 - Symptomatic but completely ambulatory  Blood pressure (!) 93/48, pulse 61, temperature 98.2 F (36.8  C), temperature source Oral, resp. rate 16, height _0  (1.575 m), weight 139 lb 9 oz (63.3 kg), SpO2 96 %.  LABORATORY DATA: Lab Results  Component Value Date   WBC 4.6 11/13/2021   HGB 6.6 (LL) 11/13/2021   HCT 19.6 (L) 11/13/2021   MCV 111.4 (H) 11/13/2021   PLT 181 11/13/2021      Chemistry      Component Value Date/Time   NA 137 09/02/2021 1716   NA 139 02/22/2021 1145   K 4.1 09/02/2021 1716   CL 110 09/02/2021 1716   CO2 23 09/02/2021 1716   BUN 19 09/02/2021 1716   BUN 25 02/22/2021 1145   CREATININE 1.02 (H) 09/02/2021 1716   CREATININE 1.02 (H) 08/28/2020 1123      Component Value Date/Time   CALCIUM 9.2 09/02/2021 1716   ALKPHOS 123 (H) 02/22/2021 1145   AST 19 02/22/2021 1145   AST 13 (L) 08/28/2020 1123   ALT 12 02/22/2021 1145   ALT 7 08/28/2020 1123   BILITOT 0.5 02/22/2021 1145   BILITOT 0.8 08/28/2020 1123       RADIOGRAPHIC STUDIES: No results found.  ASSESSMENT AND PLAN: This is a very pleasant 88 years old  white female with recently diagnosed low-grade myelodysplastic syndrome in addition to questionable vitamin B12 deficiency.   The patient had a bone marrow biopsy and aspirate performed recently.   The cytogenetics showed no 5 q. deletion. The patient is currently on observation and supportive care with blood transfusion as well as vitamin B12 injection. Repeat CBC today showed hemoglobin was down to 6.6 and hematocrit 19.6%. I will arrange for the patient to receive 2 units of PRBCs transfusion either today or tomorrow. I will see her back for follow-up visit in 2 months for evaluation and repeat blood work. She was advised to call sooner if she has more fatigue or weakness in the interval. The patient voices understanding of current disease status and treatment options and is in agreement with the current care plan.  All questions were answered. The patient knows to call the clinic with any problems, questions or concerns. We can certainly  see the patient much sooner if necessary.   Disclaimer: This note was dictated with voice recognition software. Similar sounding words can inadvertently be transcribed and may not be corrected upon review.

## 2021-11-13 NOTE — Progress Notes (Signed)
Blood transfusion orders entered. 

## 2021-11-14 ENCOUNTER — Inpatient Hospital Stay: Payer: Medicare Other

## 2021-11-14 DIAGNOSIS — D469 Myelodysplastic syndrome, unspecified: Secondary | ICD-10-CM

## 2021-11-14 MED ORDER — SODIUM CHLORIDE 0.9% IV SOLUTION
250.0000 mL | Freq: Once | INTRAVENOUS | Status: AC
  Administered 2021-11-14: 250 mL via INTRAVENOUS

## 2021-11-14 MED ORDER — ACETAMINOPHEN 325 MG PO TABS
650.0000 mg | ORAL_TABLET | Freq: Once | ORAL | Status: AC
  Administered 2021-11-14: 650 mg via ORAL
  Filled 2021-11-14: qty 2

## 2021-11-14 MED ORDER — DIPHENHYDRAMINE HCL 25 MG PO CAPS
25.0000 mg | ORAL_CAPSULE | Freq: Once | ORAL | Status: AC
  Administered 2021-11-14: 25 mg via ORAL
  Filled 2021-11-14: qty 1

## 2021-11-14 NOTE — Patient Instructions (Signed)

## 2021-11-15 LAB — BPAM RBC
Blood Product Expiration Date: 202310252359
Blood Product Expiration Date: 202311032359
ISSUE DATE / TIME: 202310051104
ISSUE DATE / TIME: 202310051104
Unit Type and Rh: 9500
Unit Type and Rh: 9500

## 2021-11-15 LAB — TYPE AND SCREEN
ABO/RH(D): O NEG
Antibody Screen: NEGATIVE
Unit division: 0
Unit division: 0

## 2021-11-25 ENCOUNTER — Ambulatory Visit (INDEPENDENT_AMBULATORY_CARE_PROVIDER_SITE_OTHER): Payer: Medicare Other

## 2021-11-25 DIAGNOSIS — I495 Sick sinus syndrome: Secondary | ICD-10-CM

## 2021-12-02 LAB — CUP PACEART REMOTE DEVICE CHECK
Battery Remaining Longevity: 41 mo
Battery Voltage: 2.97 V
Brady Statistic AP VP Percent: 1.47 %
Brady Statistic AP VS Percent: 89.8 %
Brady Statistic AS VP Percent: 1.41 %
Brady Statistic AS VS Percent: 7.32 %
Brady Statistic RA Percent Paced: 90.71 %
Brady Statistic RV Percent Paced: 2.85 %
Date Time Interrogation Session: 20231023155410
Implantable Lead Implant Date: 20180115
Implantable Lead Implant Date: 20180115
Implantable Lead Location: 753859
Implantable Lead Location: 753860
Implantable Lead Model: 5076
Implantable Lead Model: 5076
Implantable Pulse Generator Implant Date: 20180115
Lead Channel Impedance Value: 323 Ohm
Lead Channel Impedance Value: 399 Ohm
Lead Channel Impedance Value: 399 Ohm
Lead Channel Impedance Value: 456 Ohm
Lead Channel Pacing Threshold Amplitude: 0.875 V
Lead Channel Pacing Threshold Amplitude: 0.875 V
Lead Channel Pacing Threshold Pulse Width: 0.4 ms
Lead Channel Pacing Threshold Pulse Width: 0.4 ms
Lead Channel Sensing Intrinsic Amplitude: 0.25 mV
Lead Channel Sensing Intrinsic Amplitude: 0.25 mV
Lead Channel Sensing Intrinsic Amplitude: 12.25 mV
Lead Channel Sensing Intrinsic Amplitude: 12.25 mV
Lead Channel Setting Pacing Amplitude: 2 V
Lead Channel Setting Pacing Amplitude: 2.5 V
Lead Channel Setting Pacing Pulse Width: 0.4 ms
Lead Channel Setting Sensing Sensitivity: 2 mV

## 2021-12-04 ENCOUNTER — Ambulatory Visit: Payer: Medicare Other | Attending: Cardiology | Admitting: Cardiology

## 2021-12-04 ENCOUNTER — Encounter: Payer: Self-pay | Admitting: Cardiology

## 2021-12-04 VITALS — BP 126/66 | HR 88 | Ht 64.0 in | Wt 143.2 lb

## 2021-12-04 DIAGNOSIS — I495 Sick sinus syndrome: Secondary | ICD-10-CM | POA: Diagnosis not present

## 2021-12-04 DIAGNOSIS — R0609 Other forms of dyspnea: Secondary | ICD-10-CM

## 2021-12-04 DIAGNOSIS — D649 Anemia, unspecified: Secondary | ICD-10-CM

## 2021-12-04 DIAGNOSIS — I480 Paroxysmal atrial fibrillation: Secondary | ICD-10-CM | POA: Diagnosis not present

## 2021-12-04 DIAGNOSIS — R600 Localized edema: Secondary | ICD-10-CM

## 2021-12-04 NOTE — Progress Notes (Signed)
Cardiology Office Note:    Date:  12/04/2021   ID:  Megan Hester, DOB [birth year], MRN [id number]  PCP:  [name], MD  Cardiologist:  Donato Heinz, MD  Electrophysiologist:  [name], MD   Referring MD: [name], MD   Chief complaint: Atrial fibrillation  History of Present Illness:    Megan Hester is a 88 y.o. female with a hx of SBO with small bowel resection 02/01/2019, paroxysmal atrial fibrillation, sick sinus syndrome status post PPM, hypertension, type 2 diabetes, myelodysplastic syndrome who presents for follow-up.  She was admitted to [facility] with SBO and underwent small bowel resection 02/01/2019.  Her postoperative course was complicated by AF with RVR.  Given her hypotension, she was initially started on amiodarone drip.  She was transitioned off amiodarone due to elevated LFTs and started on metoprolol and digoxin for rate control.  Plan on discharge on 02/16/2019 was for 2 weeks of digoxin and then restart flecainide.  She reported that when she restarted flecainide 2 weeks post discharge, she developed extensive lower extremity edema and weakness/dizziness.  Her symptoms resolved with stopping flecainide.  Most recent device check 09/07/2019 showed AF burden 12%, rates controlled.  TTE 02/08/2019 showed LVEF 50 to 55%, normal RV function, indeterminate diastolic function, no significant valvular disease.  TTE 09/19/2020 showed EF 50- 67%, grade 3 diastolic dysfunction, normal RV function, mild to moderate TR, 6 mild aortic dilatation measuring 42 mm, RVSP 36.  Since last clinic visit, she reports that she is doing okay.  Continues to have issues with dyspnea and fatigue but symptoms improve when she gets a blood transfusion.  She is having transfusions about every 2 months or so.  She reports having lightheadedness but no syncope.  Denies any chest pain.  Does report has been having some lower extremity edema over the last 3 months and has  been using compression stockings.  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[place]) 1999   DCIS   GERD (gastroesophageal reflux disease)    hx; "when I was working"   Hypertension    Macular degeneration    Paroxysmal atrial fibrillation ([place])    Presence of permanent cardiac pacemaker    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    Past Surgical History:  Procedure Laterality Date   APPLICATION OF WOUND VAC Bilateral 02/01/2019   Procedure: APPLICATION OF WOUND VAC;  Surgeon: [name], MD;  [facility];  Service: General;  Laterality: Bilateral;   BREAST BIOPSY Right    BREAST LUMPECTOMY Right 1999   RADIATION   CARDIAC CATHETERIZATION  02/05/1999   HYPERDYNAMIC LEFT VENTRICLE WITH EF OF 75-80%   CARDIOVERSION N/A 01/22/2015   Procedure: CARDIOVERSION;  Surgeon: [name], MD;  Location: [place];  Service: Cardiovascular;  Laterality: N/A;   CARDIOVERSION N/A 02/22/2015   Procedure: CARDIOVERSION;  Surgeon: [name], MD;  Location: [facility];  Service: Cardiovascular;  Laterality: N/A;   [place] Right 2012  EP IMPLANTABLE DEVICE N/A 02/25/2016   Procedure: Pacemaker Implant;  Surgeon: [name], MD;  Location: [facility];  Service: Cardiovascular;  Laterality: N/A;   INSERT / REPLACE / REMOVE PACEMAKER     LAPAROSCOPIC SMALL BOWEL RESECTION N/A 02/01/2019   Procedure: LAPAROSCOPIC SMALL BOWEL RESECTION WITH TAP BLOCK;  Surgeon: [name], MD;  [facility];  Service: General;  Laterality: N/A;   LAPAROSCOPY N/A 02/01/2019   Procedure: LAPAROSCOPY DIAGNOSTIC;   Surgeon: [name], MD;  [facility];  Service: General;  Laterality: N/A;   LYSIS OF ADHESION N/A 02/01/2019   Procedure: LYSIS OF ADHESION;  Surgeon: [name], MD;  [facility];  Service: General;  Laterality: N/A;   TONSILLECTOMY      Current Medications: Current Meds  Medication Sig   amiodarone (PACERONE) 200 MG tablet Take 200 mg by mouth daily.   cetirizine (ZYRTEC) 10 MG tablet Take 10 mg by mouth daily with supper.   Cholecalciferol (VITAMIN D) 1000 UNITS capsule Take 1,000 Units by mouth daily with supper.   clotrimazole-betamethasone (LOTRISONE) cream Apply 1 application topically 2 (two) times daily. Female parts   Glucosamine HCl POWD by Does not apply route.   Hypromellose (ARTIFICIAL TEARS OP) Place 1 drop into the left eye daily as needed (dry eye).   Melatonin 10 MG TABS Take 10 mg by mouth daily in the afternoon. Supper and bedime   metoprolol succinate (TOPROL XL) 25 MG 24 hr tablet Take 1 tablet (25 mg total) by mouth daily.   Multiple Vitamins-Minerals (MULTIVITAMIN WITH MINERALS) tablet Take 1 tablet by mouth 2 (two) times daily.   Multiple Vitamins-Minerals (PRESERVISION/LUTEIN PO) Take 1 capsule by mouth 2 (two) times daily.    Polyethyl Glycol-Propyl Glycol (SYSTANE OP) Place 1 drop into the right eye daily as needed (dry eye). gel   polyethylene glycol (MIRALAX / GLYCOLAX) packet Take 17 g by mouth every evening.   QUEtiapine (SEROQUEL) 25 MG tablet Take 25 mg by mouth at bedtime.   sertraline (ZOLOFT) 100 MG tablet Take 100 mg by mouth at bedtime.   XARELTO 15 MG TABS tablet TAKE ONE TABLET BY MOUTH ONCE DAILY WITH SUPPER   [DISCONTINUED] amiodarone (PACERONE) 200 MG tablet Take 1 tablet (200 mg total) by mouth 2 (two) times daily.     Allergies:   Shellfish allergy, Atropine, Codeine, Doxycycline, Erythromycin, Fenoprofen calcium, Iodinated contrast media, Isopto hyoscine [scopolamine], Levaquin [levofloxacin], Naproxen, Other, Oxycodone,  Penicillins, Sulfa drugs cross reactors, Warfarin, Ceclor [cefaclor], and Tape   Social History   Socioeconomic History   Marital status: Married    Spouse name: Not on file   Number of children: 3   Years of education: Not on file   Highest education level: Not on file  Occupational History    Employer: RETIRED  Tobacco Use   Smoking status: Never   Smokeless tobacco: Never  Vaping Use   Vaping Use: Never used  Substance and Sexual Activity   Alcohol use: Yes    Alcohol/week: 0.0 standard drinks of alcohol    Comment: 02/25/2016 "glass of wine/month"   Drug use: No   Sexual activity: Yes    Birth control/protection: Post-menopausal    Comment: 1st intercourse 72 yo-1 partner  Other Topics Concern   Not on file  Social History Narrative   Lives in [place].  Retired Solicitor.   Social Determinants of Health   Financial Resource Strain: Not on file  Food Insecurity: Not on file  Transportation Needs: Not on file  Physical Activity: Not on file  Stress: Not on file  Social Connections: Not on file     Family History: The patient's family history includes Diabetes in her brother; Heart attack in her father; Heart disease in her brother; Hypertension in her father; Lung cancer in her brother; Parkinsonism in her father; Stroke in her mother.  ROS:   Please see the history of present illness.    All other systems reviewed and are negative.  EKGs/Labs/Other Studies Reviewed:    The following studies were reviewed today:  02/08/2019 Echo: 1. Left ventricular ejection fraction, by visual estimation, is 50 to  55%. The left ventricle has low normal function. Left ventricular septal  wall thickness was normal. Normal left ventricular posterior wall  thickness. There is no left ventricular  hypertrophy.   2. Left ventricular diastolic parameters are indeterminate.   3. The left ventricle has no regional wall motion abnormalities.   4. Global right ventricle  has normal systolic function.The right  ventricular size is normal. No increase in right ventricular wall  thickness.   5. Left atrial size was normal.   6. Right atrial size was normal.   7. The mitral valve is normal in structure. Trivial mitral valve  regurgitation. No evidence of mitral stenosis.   8. The tricuspid valve is normal in structure.   9. The aortic valve is tricuspid. Aortic valve regurgitation is not  visualized. No evidence of aortic valve sclerosis or stenosis.  10. The pulmonic valve was normal in structure. Pulmonic valve  regurgitation is not visualized.  11. Mildly elevated pulmonary artery systolic pressure.  12. A pacer wire is visualized.  13. The inferior vena cava is normal in size with greater than 50%  respiratory variability, suggesting right atrial pressure of 3 mmHg.   EKG:  05/30/2021: Atrial paced, right bundle branch block, rate 60 02/22/21: Afib, rate 85, iRBBB 01/01/2021: Atrial fibrillation, rate 61, incomplete right bundle branch block 08/17/2020: atrial fib, rate 97 incomplete RBBB 06/21/2019: atrial fibrillation, occasional ventricular paced complexes, rate 91  Recent Labs: 02/22/2021: ALT 12; TSH 1.830 09/02/2021: BUN 19; Creatinine, Ser 1.02; Potassium 4.1; Sodium 137 11/13/2021: Hemoglobin 6.6; Platelet Count 181  Recent Lipid Panel    Component Value Date/Time   TRIG 95 02/14/2019 0326    Physical Exam:    VS:  BP 126/66 (BP Location: Left Arm, Patient Position: Sitting, Cuff Size: Normal)   Pulse 88   Ht _0  (1.626 m)   Wt 143 lb 3.2 oz (65 kg)   SpO2 94%   BMI 24.58 kg/m     Wt Readings from Last 3 Encounters:  12/04/21 143 lb 3.2 oz (65 kg)  11/13/21 139 lb 9 oz (63.3 kg)  10/16/21 133 lb (60.3 kg)    BP 90/60 on recheck GEN:  in no acute distress HEENT: Normal NECK: No JVD CARDIAC: RRR, no murmurs RESPIRATORY:  Clear to auscultation without rales, wheezing or rhonchi  ABDOMEN: Soft, non-tender MUSCULOSKELETAL:   Trace edema SKIN: Warm and dry NEUROLOGIC:  Alert and oriented x 3 PSYCHIATRIC:  Normal affect   ASSESSMENT:    1. PAF (paroxysmal atrial fibrillation) ([place])   2. DOE (dyspnea on exertion)   3. Anemia, unspecified type   4. Sick sinus syndrome ([place])   5. Bilateral leg edema       PLAN:    Atrial fibrillation: Follows with EP.  Echo 02/08/2019 showed EF 50-55% -Continue Toprol-XL 25 mg daily -  Continue Xarelto -Continue amiodarone 200 mg daily.  Check CMET, TSH.  Recent device interrogation shows significant improvement in A-fib burden, down from 30% to 0.2% -Suspect her symptoms (dyspnea, fatigue) are primarily driven by her anemia and not A-fib.  She reports significant improvement following transfusions  Hypotension: BP soft at prior clinic visit.  Improved, 126/66 in clinic today.  Continue Toprol-XL as above  Dyspnea: Reports dyspnea with minimal exertion.  Appears euvolemic on exam.  TTE 09/19/2020 showed EF 50- 50%, grade 3 diastolic dysfunction, normal RV function, mild to moderate TR, 6 mild aortic dilatation measuring 42 mm, RVSP 36.  Anemia likely contributing, follows with hematology, underwent bone marrow biopsy and found to have myelodysplastic syndrome.  She has been undergoing PRBC transfusions  Lower extremity edema: She reports recent edema.  Trivial on exam in clinic today.  Encouraged continued use of compression stockings.  Check CMET, BNP  Anemia: Due to myelodysplastic syndrome.  Follows with hematology, has been receiving PRBC transfusions   SSS: s/p PPM: Follows with EP   RTC in 6 months    Medication Adjustments/Labs and Tests Ordered: Current medicines are reviewed at length with the patient today.  Concerns regarding medicines are outlined above.  Orders Placed This Encounter  Procedures   Comprehensive metabolic panel   CBC   Brain natriuretic peptide   TSH     No orders of the defined types were placed in this encounter.    Patient  Instructions  Medication Instructions:  Your physician recommends that you continue on your current medications as directed. Please refer to the Current Medication list given to you today.  *If you need a refill on your cardiac medications before your next appointment, please call your pharmacy*   Lab Work: CMET, CBC, TSH, BNP today  If you have labs (blood work) drawn today and your tests are completely normal, you will receive your results only by: [place] (if you have MyChart) OR A paper copy in the mail If you have any lab test that is abnormal or we need to change your treatment, we will call you to review the results.  Follow-Up: At [facility], you and your health needs are our priority.  As part of our continuing mission to provide you with exceptional heart care, we have created designated Provider Care Teams.  These Care Teams include your primary Cardiologist (physician) and Advanced Practice Providers (APPs -  Physician Assistants and Nurse Practitioners) who all work together to provide you with the care you need, when you need it.  We recommend signing up for the patient portal called "MyChart".  Sign up information is provided on this After Visit Summary.  MyChart is used to connect with patients for Virtual Visits (Telemedicine).  Patients are able to view lab/test results, encounter notes, upcoming appointments, etc.  Non-urgent messages can be sent to your provider as well.   To learn more about what you can do with MyChart, go to [email or web address].    Your next appointment:   6 month(s)  The format for your next appointment:   In Person  Provider:   Donato Heinz, MD                Signed, Donato Heinz, MD  12/04/2021 1:48 PM    [place]

## 2021-12-04 NOTE — Patient Instructions (Signed)
Medication Instructions:  Your physician recommends that you continue on your current medications as directed. Please refer to the Current Medication list given to you today.  *If you need a refill on your cardiac medications before your next appointment, please call your pharmacy*   Lab Work: CMET, CBC, TSH, BNP today  If you have labs (blood work) drawn today and your tests are completely normal, you will receive your results only by: [place] (if you have MyChart) OR A paper copy in the mail If you have any lab test that is abnormal or we need to change your treatment, we will call you to review the results.  Follow-Up: At [facility], you and your health needs are our priority.  As part of our continuing mission to provide you with exceptional heart care, we have created designated Provider Care Teams.  These Care Teams include your primary Cardiologist (physician) and Advanced Practice Providers (APPs -  Physician Assistants and Nurse Practitioners) who all work together to provide you with the care you need, when you need it.  We recommend signing up for the patient portal called "MyChart".  Sign up information is provided on this After Visit Summary.  MyChart is used to connect with patients for Virtual Visits (Telemedicine).  Patients are able to view lab/test results, encounter notes, upcoming appointments, etc.  Non-urgent messages can be sent to your provider as well.   To learn more about what you can do with MyChart, go to [email or web address].    Your next appointment:   6 month(s)  The format for your next appointment:   In Person  Provider:   Donato Heinz, MD

## 2021-12-06 LAB — CBC
Hematocrit: 25.2 % — ABNORMAL LOW (ref 34.0–46.6)
Hemoglobin: 8.5 g/dL — ABNORMAL LOW (ref 11.1–15.9)
MCH: 34.4 pg — ABNORMAL HIGH (ref 26.6–33.0)
MCHC: 33.7 g/dL (ref 31.5–35.7)
MCV: 102 fL — ABNORMAL HIGH (ref 79–97)
Platelets: 217 10*3/uL (ref 150–450)
RBC: 2.47 x10E6/uL — CL (ref 3.77–5.28)
RDW: 20.5 % — ABNORMAL HIGH (ref 11.7–15.4)
WBC: 5 10*3/uL (ref 3.4–10.8)

## 2021-12-06 LAB — BRAIN NATRIURETIC PEPTIDE: BNP: 325.3 pg/mL — ABNORMAL HIGH (ref 0.0–100.0)

## 2021-12-06 LAB — COMPREHENSIVE METABOLIC PANEL
ALT: 12 IU/L (ref 0–32)
AST: 16 IU/L (ref 0–40)
Albumin/Globulin Ratio: 1.7 (ref 1.2–2.2)
Albumin: 4.1 g/dL (ref 3.7–4.7)
Alkaline Phosphatase: 137 IU/L — ABNORMAL HIGH (ref 44–121)
BUN/Creatinine Ratio: 16 (ref 12–28)
BUN: 19 mg/dL (ref 8–27)
Bilirubin Total: 0.5 mg/dL (ref 0.0–1.2)
CO2: 25 mmol/L (ref 20–29)
Calcium: 9.5 mg/dL (ref 8.7–10.3)
Chloride: 105 mmol/L (ref 96–106)
Creatinine, Ser: 1.16 mg/dL — ABNORMAL HIGH (ref 0.57–1.00)
Globulin, Total: 2.4 g/dL (ref 1.5–4.5)
Glucose: 123 mg/dL — ABNORMAL HIGH (ref 70–99)
Potassium: 5.1 mmol/L (ref 3.5–5.2)
Sodium: 141 mmol/L (ref 134–144)
Total Protein: 6.5 g/dL (ref 6.0–8.5)
eGFR: 45 mL/min/{1.73_m2} — ABNORMAL LOW (ref >59)

## 2021-12-06 LAB — TSH: TSH: 1.47 u[IU]/mL (ref 0.450–4.500)

## 2021-12-11 ENCOUNTER — Encounter: Payer: Self-pay | Admitting: *Deleted

## 2021-12-13 NOTE — Progress Notes (Signed)
Remote pacemaker transmission.   

## 2021-12-18 ENCOUNTER — Other Ambulatory Visit: Payer: Self-pay

## 2021-12-18 ENCOUNTER — Inpatient Hospital Stay: Payer: Medicare Other | Attending: Internal Medicine

## 2021-12-18 DIAGNOSIS — E538 Deficiency of other specified B group vitamins: Secondary | ICD-10-CM | POA: Insufficient documentation

## 2021-12-18 DIAGNOSIS — D539 Nutritional anemia, unspecified: Secondary | ICD-10-CM

## 2021-12-18 MED ORDER — CYANOCOBALAMIN 1000 MCG/ML IJ SOLN
1000.0000 ug | INTRAMUSCULAR | Status: DC
  Administered 2021-12-18: 1000 ug via INTRAMUSCULAR
  Filled 2021-12-18: qty 1

## 2021-12-18 NOTE — Patient Instructions (Signed)
Vitamin B12 Deficiency Vitamin B12 deficiency occurs when the body does not have enough of this important vitamin. The body needs this vitamin: To make red blood cells. To make DNA. This is the genetic material inside cells. To help the nerves work properly so they can carry messages from the brain to the body. Vitamin B12 deficiency can cause health problems, such as not having enough red blood cells in the blood (anemia). This can lead to nerve damage if untreated. What are the causes? This condition may be caused by: Not eating enough foods that contain vitamin B12. Not having enough stomach acid and digestive fluids to properly absorb vitamin B12 from the food that you eat. Having certain diseases that make it hard to absorb vitamin B12. These diseases include Crohn's disease, chronic pancreatitis, and cystic fibrosis. An autoimmune disorder in which the body does not make enough of a protein (intrinsic factor) within the stomach, resulting in not enough absorption of vitamin B12. Having a surgery in which part of the stomach or small intestine is removed. Taking certain medicines that make it hard for the body to absorb vitamin B12. These include: Heartburn medicines, such as antacids and proton pump inhibitors. Some medicines that are used to treat diabetes. What increases the risk? The following factors may make you more likely to develop a vitamin B12 deficiency: Being an older adult. Eating a vegetarian or vegan diet that does not include any foods that come from animals. Eating a poor diet while you are pregnant. Taking certain medicines. Having alcoholism. What are the signs or symptoms? In some cases, there are no symptoms of this condition. If the condition leads to anemia or nerve damage, various symptoms may occur, such as: Weakness. Tiredness (fatigue). Loss of appetite. Numbness or tingling in your hands and feet. Redness and burning of the tongue. Depression,  confusion, or memory problems. Trouble walking. If anemia is severe, symptoms can include: Shortness of breath. Dizziness. Rapid heart rate. How is this diagnosed? This condition may be diagnosed with a blood test to measure the level of vitamin B12 in your blood. You may also have other tests, including: A group of tests that measure certain characteristics of blood cells (complete blood count, CBC). A blood test to measure intrinsic factor. A procedure where a thin tube with a camera on the end is used to look into your stomach or intestines (endoscopy). Other tests may be needed to discover the cause of the deficiency. How is this treated? Treatment for this condition depends on the cause. This condition may be treated by: Changing your eating and drinking habits, such as: Eating more foods that contain vitamin B12. Drinking less alcohol or no alcohol. Getting vitamin B12 injections. Taking vitamin B12 supplements by mouth (orally). Your health care provider will tell you which dose is best for you. Follow these instructions at home: Eating and drinking  Include foods in your diet that come from animals and contain a lot of vitamin B12. These include: Meats and poultry. This includes beef, pork, chicken, turkey, and organ meats, such as liver. Seafood. This includes clams, rainbow trout, salmon, tuna, and haddock. Eggs. Dairy foods such as milk, yogurt, and cheese. Eat foods that have vitamin B12 added to them (are fortified), such as ready-to-eat breakfast cereals. Check the label on the package to see if a food is fortified. The items listed above may not be a complete list of foods and beverages you can eat and drink. Contact a dietitian for   more information. Alcohol use Do not drink alcohol if: Your health care provider tells you not to drink. You are pregnant, may be pregnant, or are planning to become pregnant. If you drink alcohol: Limit how much you have to: 0-1 drink a  day for women. 0-2 drinks a day for men. Know how much alcohol is in your drink. In the [place] one drink equals one 12 oz bottle of beer (355 mL), one 5 oz glass of wine (148 mL), or one 1 oz glass of hard liquor (44 mL). General instructions Get vitamin B12 injections if told to by your health care provider. Take supplements only as told by your health care provider. Follow the directions carefully. Keep all follow-up visits. This is important. Contact a health care provider if: Your symptoms come back. Your symptoms get worse or do not improve with treatment. Get help right away: You develop shortness of breath. You have a rapid heart rate. You have chest pain. You become dizzy or you faint. These symptoms may be an emergency. Get help right away. Call 911. Do not wait to see if the symptoms will go away. Do not drive yourself to the hospital. Summary Vitamin B12 deficiency occurs when the body does not have enough of this important vitamin. Common causes include not eating enough foods that contain vitamin B12, not being able to absorb vitamin B12 from the food that you eat, having a surgery in which part of the stomach or small intestine is removed, or taking certain medicines. Eat foods that have vitamin B12 in them. Treatment may include making a change in the way you eat and drink, getting vitamin B12 injections, or taking vitamin B12 supplements. This information is not intended to replace advice given to you by your health care provider. Make sure you discuss any questions you have with your health care provider. Document Revised: 09/21/2020 Document Reviewed: 09/21/2020 Elsevier Patient Education  2023 [employer]  

## 2022-01-06 ENCOUNTER — Telehealth: Payer: Self-pay | Admitting: Internal Medicine

## 2022-01-06 NOTE — Telephone Encounter (Signed)
Called patient regarding upcoming December appointments, spoke with patient's son. Patient will be notified.

## 2022-01-07 ENCOUNTER — Other Ambulatory Visit: Payer: Self-pay

## 2022-01-07 ENCOUNTER — Encounter (HOSPITAL_BASED_OUTPATIENT_CLINIC_OR_DEPARTMENT_OTHER): Payer: Self-pay

## 2022-01-07 ENCOUNTER — Emergency Department (HOSPITAL_BASED_OUTPATIENT_CLINIC_OR_DEPARTMENT_OTHER)
Admission: EM | Admit: 2022-01-07 | Discharge: 2022-01-07 | Disposition: A | Discharge status: Home / Self Care | Payer: Medicare Other | Attending: Emergency Medicine | Admitting: Emergency Medicine

## 2022-01-07 DIAGNOSIS — M5431 Sciatica, right side: Secondary | ICD-10-CM

## 2022-01-07 DIAGNOSIS — M545 Low back pain, unspecified: Secondary | ICD-10-CM | POA: Diagnosis present

## 2022-01-07 DIAGNOSIS — M5441 Lumbago with sciatica, right side: Secondary | ICD-10-CM | POA: Insufficient documentation

## 2022-01-07 DIAGNOSIS — Z7901 Long term (current) use of anticoagulants: Secondary | ICD-10-CM | POA: Insufficient documentation

## 2022-01-07 MED ORDER — KETOROLAC TROMETHAMINE 60 MG/2ML IM SOLN
30.0000 mg | Freq: Once | INTRAMUSCULAR | Status: AC
  Administered 2022-01-07: 30 mg via INTRAMUSCULAR
  Filled 2022-01-07: qty 2

## 2022-01-07 MED ORDER — METHYLPREDNISOLONE 4 MG PO TBPK: ORAL_TABLET | ORAL | 0 refills | Status: DC

## 2022-01-07 MED ORDER — GABAPENTIN 100 MG PO CAPS: 100.0000 mg | ORAL_CAPSULE | Freq: Three times a day (TID) | ORAL | 0 refills | Status: DC | PRN

## 2022-01-07 NOTE — ED Triage Notes (Signed)
Patient here POV from Home with Family.  Endorses Lower Back Pain that has been present for 304 Days. Worse Today. Right Side is worse. Radiates down Bilateral legs.   No Dysuria or Hematuria.   NAD Noted during Triage. A&Ox4. GCS 15. BIB Personal Wheelchair.

## 2022-01-07 NOTE — ED Provider Notes (Signed)
[facility] Provider Note   CSN: [id number] Arrival date & time: 01/07/22  1339     History  Chief Complaint  Patient presents with   Back Pain    Megan Hester is a 88 y.o. female presented to ED with lower back pain, no prior evidence of trauma, reporting she been sitting in a chair with her legs up for the past 3 days began having pain in her right lower back that radiates down to her right knee.  It is worse with any movement.  Denies history of trauma or spinal surgery.  She is only taken Tylenol at home for pain  HPI     Home Medications Prior to Admission medications   Medication Sig Start Date End Date Taking? Authorizing Provider  gabapentin (NEURONTIN) 100 MG capsule Take 1 capsule (100 mg total) by mouth 3 (three) times daily as needed for up to 30 doses. 01/07/22  Yes Wyvonnia Dusky, MD  methylPREDNISolone (MEDROL DOSEPAK) 4 MG TBPK tablet Use as directed on package 01/07/22  Yes Embree Brawley, Carola Rhine, MD  amiodarone (PACERONE) 200 MG tablet Take 200 mg by mouth daily.    [provider]  cetirizine (ZYRTEC) 10 MG tablet Take 10 mg by mouth daily with supper.    [provider]  Cholecalciferol (VITAMIN D) 1000 UNITS capsule Take 1,000 Units by mouth daily with supper.    [provider]  clotrimazole-betamethasone (LOTRISONE) cream Apply 1 application topically 2 (two) times daily. Female parts 12/31/20   [provider]  Glucosamine HCl POWD by Does not apply route.    [provider]  Hypromellose (ARTIFICIAL TEARS OP) Place 1 drop into the left eye daily as needed (dry eye).    [provider]  Melatonin 10 MG TABS Take 10 mg by mouth daily in the afternoon. Supper and bedime    [provider]  metoprolol succinate (TOPROL XL) 25 MG 24 hr tablet Take 1 tablet (25 mg total) by mouth daily. 05/30/21 05/31/22  Donato Heinz, MD  Multiple Vitamins-Minerals  (MULTIVITAMIN WITH MINERALS) tablet Take 1 tablet by mouth 2 (two) times daily.    [provider]  Multiple Vitamins-Minerals (PRESERVISION/LUTEIN PO) Take 1 capsule by mouth 2 (two) times daily.     [provider]  Polyethyl Glycol-Propyl Glycol (SYSTANE OP) Place 1 drop into the right eye daily as needed (dry eye). gel    [provider]  polyethylene glycol (MIRALAX / GLYCOLAX) packet Take 17 g by mouth every evening.    [provider]  QUEtiapine (SEROQUEL) 25 MG tablet Take 25 mg by mouth at bedtime. 12/11/19   [provider]  sertraline (ZOLOFT) 100 MG tablet Take 100 mg by mouth at bedtime.    [provider]  XARELTO 15 MG TABS tablet TAKE ONE TABLET BY MOUTH ONCE DAILY WITH SUPPER 09/24/21   Donato Heinz, MD      Allergies    Shellfish allergy, Atropine, Codeine, Doxycycline, Erythromycin, Fenoprofen calcium, Iodinated contrast media, Isopto hyoscine [scopolamine], Levaquin [levofloxacin], Naproxen, Other, Oxycodone, Penicillins, Sulfa drugs cross reactors, Warfarin, Ceclor [cefaclor], and Tape    Review of Systems   Review of Systems  Physical Exam Updated Vital Signs BP 123/79 (BP Location: Left Arm)   Pulse 63   Temp 97.8 F (36.6 C)   Resp 18   Ht '5\' 4"'$  (1.626 m)   Wt 65 kg   SpO2 100%   BMI 24.60 kg/m  Physical Exam Constitutional:      General: She is not in acute distress. HENT:     Head: Normocephalic and atraumatic.  Eyes:     Conjunctiva/sclera: Conjunctivae normal.     Pupils: Pupils are equal, round, and reactive to light.  Cardiovascular:     Rate and Rhythm: Normal rate and regular rhythm.  Pulmonary:     Effort: Pulmonary effort is normal. No respiratory distress.  Skin:    General: Skin is warm and dry.  Neurological:     General: No focal deficit present.     Mental Status: She is alert. Mental status is at baseline.     Comments: Good strength in the lower extremities, no sensory  deficits.  Positive straight leg test on the right. No spinal midline tenderness  Psychiatric:        Mood and Affect: Mood normal.        Behavior: Behavior normal.     ED Results / Procedures / Treatments   Labs (all labs ordered are listed, but only abnormal results are displayed) Labs Reviewed  URINALYSIS, ROUTINE W REFLEX MICROSCOPIC    EKG None  Radiology No results found.  Procedures Procedures    Medications Ordered in ED Medications  ketorolac (TORADOL) injection 30 mg (has no administration in time range)    ED Course/ Medical Decision Making/ A&P                           Medical Decision Making Amount and/or Complexity of Data Reviewed Labs: ordered.  Risk Prescription drug management.   Differential would include sciatica versus radiculopathy versus other.  Low suspicion for fracture with no traumatic mechanism or injury.  This pain began while she was sitting at rest for a long time.  I do not see an indication for emergent imaging at this time.  No indication for MR imaging in the ED at this time.  Doubt cauda equina syndrome.  We discussed an IM injection of Toradol (she states that NSAIDs give her an upset stomach but she is willing to take a shot here for her pain).  She prefer to avoid opioids which I agree with.  We can try Medrol Dosepak as well as some gabapentin low-dose at home.  They can continue Tylenol.  Her son was also present via supplemental history, and help take her home.  She will contact her old orthopedic office at [place] to schedule follow-up if she has persistent pain.        Final Clinical Impression(s) / ED Diagnoses Final diagnoses:  Sciatica of right side    Rx / DC Orders ED Discharge Orders          Ordered    gabapentin (NEURONTIN) 100 MG capsule  3 times daily PRN        01/07/22 1629    methylPREDNISolone (MEDROL DOSEPAK) 4 MG TBPK tablet        01/07/22 1629              Wyvonnia Dusky, MD 01/07/22 1629

## 2022-01-07 NOTE — ED Notes (Signed)
Discharge instructions, follow up care, and prescriptions reviewed and explained, pt verbalized understanding.  

## 2022-01-14 ENCOUNTER — Inpatient Hospital Stay: Payer: Medicare Other | Admitting: Internal Medicine

## 2022-01-14 ENCOUNTER — Emergency Department (HOSPITAL_COMMUNITY): Payer: Medicare Other

## 2022-01-14 ENCOUNTER — Inpatient Hospital Stay (HOSPITAL_COMMUNITY)
Admission: EM | Admit: 2022-01-14 | Discharge: 2022-01-18 | DRG: 378 | Disposition: A | Discharge status: Home / Self Care | Payer: Medicare Other | Source: Ambulatory Visit | Attending: Internal Medicine | Admitting: Internal Medicine

## 2022-01-14 ENCOUNTER — Inpatient Hospital Stay: Payer: Medicare Other | Attending: Internal Medicine

## 2022-01-14 ENCOUNTER — Encounter (HOSPITAL_COMMUNITY): Payer: Self-pay | Admitting: Emergency Medicine

## 2022-01-14 ENCOUNTER — Inpatient Hospital Stay: Payer: Medicare Other

## 2022-01-14 ENCOUNTER — Other Ambulatory Visit: Payer: Self-pay

## 2022-01-14 VITALS — BP 142/62 | HR 61 | Temp 98.3°F | Resp 16

## 2022-01-14 DIAGNOSIS — Z885 Allergy status to narcotic agent status: Secondary | ICD-10-CM

## 2022-01-14 DIAGNOSIS — Z853 Personal history of malignant neoplasm of breast: Secondary | ICD-10-CM | POA: Diagnosis not present

## 2022-01-14 DIAGNOSIS — I4891 Unspecified atrial fibrillation: Secondary | ICD-10-CM | POA: Diagnosis not present

## 2022-01-14 DIAGNOSIS — H547 Unspecified visual loss: Secondary | ICD-10-CM | POA: Diagnosis present

## 2022-01-14 DIAGNOSIS — Z950 Presence of cardiac pacemaker: Secondary | ICD-10-CM | POA: Diagnosis not present

## 2022-01-14 DIAGNOSIS — Z79899 Other long term (current) drug therapy: Secondary | ICD-10-CM

## 2022-01-14 DIAGNOSIS — Z8249 Family history of ischemic heart disease and other diseases of the circulatory system: Secondary | ICD-10-CM

## 2022-01-14 DIAGNOSIS — D469 Myelodysplastic syndrome, unspecified: Secondary | ICD-10-CM

## 2022-01-14 DIAGNOSIS — Z888 Allergy status to other drugs, medicaments and biological substances status: Secondary | ICD-10-CM

## 2022-01-14 DIAGNOSIS — E119 Type 2 diabetes mellitus without complications: Secondary | ICD-10-CM

## 2022-01-14 DIAGNOSIS — H3530 Unspecified macular degeneration: Secondary | ICD-10-CM | POA: Diagnosis present

## 2022-01-14 DIAGNOSIS — E440 Moderate protein-calorie malnutrition: Secondary | ICD-10-CM | POA: Diagnosis present

## 2022-01-14 DIAGNOSIS — D62 Acute posthemorrhagic anemia: Secondary | ICD-10-CM | POA: Diagnosis present

## 2022-01-14 DIAGNOSIS — K254 Chronic or unspecified gastric ulcer with hemorrhage: Secondary | ICD-10-CM | POA: Diagnosis present

## 2022-01-14 DIAGNOSIS — Z7901 Long term (current) use of anticoagulants: Secondary | ICD-10-CM

## 2022-01-14 DIAGNOSIS — Z801 Family history of malignant neoplasm of trachea, bronchus and lung: Secondary | ICD-10-CM

## 2022-01-14 DIAGNOSIS — N179 Acute kidney failure, unspecified: Secondary | ICD-10-CM | POA: Diagnosis present

## 2022-01-14 DIAGNOSIS — Z833 Family history of diabetes mellitus: Secondary | ICD-10-CM

## 2022-01-14 DIAGNOSIS — Z881 Allergy status to other antibiotic agents status: Secondary | ICD-10-CM

## 2022-01-14 DIAGNOSIS — Z6824 Body mass index (BMI) 24.0-24.9, adult: Secondary | ICD-10-CM

## 2022-01-14 DIAGNOSIS — I700 Atherosclerosis of aorta: Secondary | ICD-10-CM | POA: Diagnosis present

## 2022-01-14 DIAGNOSIS — K922 Gastrointestinal hemorrhage, unspecified: Secondary | ICD-10-CM

## 2022-01-14 DIAGNOSIS — I10 Essential (primary) hypertension: Secondary | ICD-10-CM | POA: Diagnosis present

## 2022-01-14 DIAGNOSIS — I4819 Other persistent atrial fibrillation: Secondary | ICD-10-CM | POA: Diagnosis not present

## 2022-01-14 DIAGNOSIS — K2900 Acute gastritis without bleeding: Secondary | ICD-10-CM | POA: Diagnosis present

## 2022-01-14 DIAGNOSIS — K219 Gastro-esophageal reflux disease without esophagitis: Secondary | ICD-10-CM | POA: Diagnosis present

## 2022-01-14 DIAGNOSIS — E538 Deficiency of other specified B group vitamins: Secondary | ICD-10-CM | POA: Diagnosis present

## 2022-01-14 DIAGNOSIS — I480 Paroxysmal atrial fibrillation: Secondary | ICD-10-CM | POA: Diagnosis present

## 2022-01-14 DIAGNOSIS — R197 Diarrhea, unspecified: Secondary | ICD-10-CM | POA: Diagnosis present

## 2022-01-14 DIAGNOSIS — K259 Gastric ulcer, unspecified as acute or chronic, without hemorrhage or perforation: Secondary | ICD-10-CM | POA: Diagnosis not present

## 2022-01-14 DIAGNOSIS — Z882 Allergy status to sulfonamides status: Secondary | ICD-10-CM

## 2022-01-14 DIAGNOSIS — Z823 Family history of stroke: Secondary | ICD-10-CM

## 2022-01-14 DIAGNOSIS — D649 Anemia, unspecified: Principal | ICD-10-CM | POA: Diagnosis present

## 2022-01-14 DIAGNOSIS — M545 Low back pain, unspecified: Secondary | ICD-10-CM | POA: Diagnosis present

## 2022-01-14 DIAGNOSIS — Z91013 Allergy to seafood: Secondary | ICD-10-CM

## 2022-01-14 DIAGNOSIS — Z880 Allergy status to penicillin: Secondary | ICD-10-CM

## 2022-01-14 LAB — CBC WITH DIFFERENTIAL (CANCER CENTER ONLY)
Abs Immature Granulocytes: 0.1 10*3/uL — ABNORMAL HIGH (ref 0.00–0.07)
Basophils Absolute: 0 10*3/uL (ref 0.0–0.1)
Basophils Relative: 1 %
Eosinophils Absolute: 0.1 10*3/uL (ref 0.0–0.5)
Eosinophils Relative: 1 %
HCT: 17.7 % — ABNORMAL LOW (ref 36.0–46.0)
Hemoglobin: 5.8 g/dL — CL (ref 12.0–15.0)
Immature Granulocytes: 1 %
Lymphocytes Relative: 13 %
Lymphs Abs: 1.1 10*3/uL (ref 0.7–4.0)
MCH: 37.9 pg — ABNORMAL HIGH (ref 26.0–34.0)
MCHC: 32.8 g/dL (ref 30.0–36.0)
MCV: 115.7 fL — ABNORMAL HIGH (ref 80.0–100.0)
Monocytes Absolute: 0.7 10*3/uL (ref 0.1–1.0)
Monocytes Relative: 8 %
Neutro Abs: 6.1 10*3/uL (ref 1.7–7.7)
Neutrophils Relative %: 76 %
Platelet Count: 263 10*3/uL (ref 150–400)
RBC: 1.53 MIL/uL — ABNORMAL LOW (ref 3.87–5.11)
RDW: 25.4 % — ABNORMAL HIGH (ref 11.5–15.5)
WBC Count: 8.1 10*3/uL (ref 4.0–10.5)
nRBC: 1.6 % — ABNORMAL HIGH (ref 0.0–0.2)

## 2022-01-14 LAB — COMPREHENSIVE METABOLIC PANEL
ALT: 14 U/L (ref 0–44)
AST: 17 U/L (ref 15–41)
Albumin: 3.5 g/dL (ref 3.5–5.0)
Alkaline Phosphatase: 115 U/L (ref 38–126)
Anion gap: 7 (ref 5–15)
BUN: 30 mg/dL — ABNORMAL HIGH (ref 8–23)
CO2: 22 mmol/L (ref 22–32)
Calcium: 8.7 mg/dL — ABNORMAL LOW (ref 8.9–10.3)
Chloride: 110 mmol/L (ref 98–111)
Creatinine, Ser: 1.28 mg/dL — ABNORMAL HIGH (ref 0.44–1.00)
GFR, Estimated: 40 mL/min — ABNORMAL LOW (ref >60)
Glucose, Bld: 115 mg/dL — ABNORMAL HIGH (ref 70–99)
Potassium: 4 mmol/L (ref 3.5–5.1)
Sodium: 139 mmol/L (ref 135–145)
Total Bilirubin: 0.9 mg/dL (ref 0.3–1.2)
Total Protein: 6.5 g/dL (ref 6.5–8.1)

## 2022-01-14 LAB — IRON AND IRON BINDING CAPACITY (CC-WL,HP ONLY)
Iron: 52 ug/dL (ref 28–170)
Saturation Ratios: 26 % (ref 10.4–31.8)
TIBC: 200 ug/dL — ABNORMAL LOW (ref 250–450)
UIBC: 148 ug/dL (ref 148–442)

## 2022-01-14 LAB — FERRITIN: Ferritin: 1055 ng/mL — ABNORMAL HIGH (ref 11–307)

## 2022-01-14 LAB — PREPARE RBC (CROSSMATCH)

## 2022-01-14 LAB — PROTIME-INR
INR: 1.7 — ABNORMAL HIGH (ref 0.8–1.2)
Prothrombin Time: 19.7 seconds — ABNORMAL HIGH (ref 11.4–15.2)

## 2022-01-14 LAB — VITAMIN B12: Vitamin B-12: 317 pg/mL (ref 180–914)

## 2022-01-14 LAB — POC OCCULT BLOOD, ED: Fecal Occult Bld: POSITIVE — AB

## 2022-01-14 MED ORDER — PROSIGHT PO TABS
1.0000 | ORAL_TABLET | Freq: Two times a day (BID) | ORAL | Status: DC
  Administered 2022-01-15 – 2022-01-18 (×6): 1 via ORAL
  Filled 2022-01-14 (×7): qty 1

## 2022-01-14 MED ORDER — POLYVINYL ALCOHOL 1.4 % OP SOLN: 1.0000 [drp] | Freq: Three times a day (TID) | OPHTHALMIC | Status: DC | PRN

## 2022-01-14 MED ORDER — SENNOSIDES-DOCUSATE SODIUM 8.6-50 MG PO TABS: 1.0000 | ORAL_TABLET | Freq: Every evening | ORAL | Status: DC | PRN

## 2022-01-14 MED ORDER — DIPHENHYDRAMINE HCL 25 MG PO CAPS
50.0000 mg | ORAL_CAPSULE | Freq: Once | ORAL | Status: AC
  Administered 2022-01-14: 50 mg via ORAL
  Filled 2022-01-14: qty 2

## 2022-01-14 MED ORDER — MELATONIN 3 MG PO TABS
3.0000 mg | ORAL_TABLET | Freq: Every day | ORAL | Status: DC
  Administered 2022-01-15 – 2022-01-17 (×4): 3 mg via ORAL
  Filled 2022-01-14 (×4): qty 1

## 2022-01-14 MED ORDER — AMIODARONE HCL 200 MG PO TABS
200.0000 mg | ORAL_TABLET | Freq: Every day | ORAL | Status: DC
  Administered 2022-01-15 – 2022-01-17 (×3): 200 mg via ORAL
  Filled 2022-01-14 (×4): qty 1

## 2022-01-14 MED ORDER — IOHEXOL 350 MG/ML SOLN
80.0000 mL | Freq: Once | INTRAVENOUS | Status: AC | PRN
  Administered 2022-01-14: 80 mL via INTRAVENOUS

## 2022-01-14 MED ORDER — PRESERVISION/LUTEIN PO CAPS: ORAL_CAPSULE | Freq: Two times a day (BID) | ORAL | Status: DC

## 2022-01-14 MED ORDER — QUETIAPINE FUMARATE 25 MG PO TABS
25.0000 mg | ORAL_TABLET | Freq: Every day | ORAL | Status: DC
  Administered 2022-01-15 – 2022-01-17 (×4): 25 mg via ORAL
  Filled 2022-01-14 (×4): qty 1

## 2022-01-14 MED ORDER — DIPHENHYDRAMINE HCL 50 MG/ML IJ SOLN
50.0000 mg | Freq: Once | INTRAMUSCULAR | Status: AC
  Filled 2022-01-14: qty 1

## 2022-01-14 MED ORDER — SODIUM CHLORIDE 0.9 % IV BOLUS
1000.0000 mL | Freq: Once | INTRAVENOUS | Status: AC
  Administered 2022-01-14: 1000 mL via INTRAVENOUS

## 2022-01-14 MED ORDER — ACETAMINOPHEN 325 MG PO TABS
650.0000 mg | ORAL_TABLET | Freq: Four times a day (QID) | ORAL | Status: DC | PRN
  Administered 2022-01-15 – 2022-01-18 (×6): 650 mg via ORAL
  Filled 2022-01-14 (×6): qty 2

## 2022-01-14 MED ORDER — METHYLPREDNISOLONE SODIUM SUCC 40 MG IJ SOLR
40.0000 mg | Freq: Once | INTRAMUSCULAR | Status: AC
  Administered 2022-01-14: 40 mg via INTRAVENOUS
  Filled 2022-01-14: qty 1

## 2022-01-14 MED ORDER — ACETAMINOPHEN 650 MG RE SUPP: 650.0000 mg | Freq: Four times a day (QID) | RECTAL | Status: DC | PRN

## 2022-01-14 MED ORDER — METOPROLOL SUCCINATE ER 25 MG PO TB24
25.0000 mg | ORAL_TABLET | Freq: Every day | ORAL | Status: DC
  Administered 2022-01-15 – 2022-01-18 (×4): 25 mg via ORAL
  Filled 2022-01-14 (×4): qty 1

## 2022-01-14 MED ORDER — SERTRALINE HCL 100 MG PO TABS
100.0000 mg | ORAL_TABLET | Freq: Every day | ORAL | Status: DC
  Administered 2022-01-15 – 2022-01-17 (×4): 100 mg via ORAL
  Filled 2022-01-14: qty 2
  Filled 2022-01-14 (×3): qty 1

## 2022-01-14 MED ORDER — SODIUM CHLORIDE 0.9 % IV SOLN: INTRAVENOUS | Status: DC

## 2022-01-14 MED ORDER — SODIUM CHLORIDE 0.9 % IV SOLN
10.0000 mL/h | Freq: Once | INTRAVENOUS | Status: AC
  Administered 2022-01-14: 10 mL/h via INTRAVENOUS

## 2022-01-14 NOTE — H&P (Signed)
PCP:   [name], MD   Chief Complaint: Abnormal labs   HPI: This is a 88 year old female with past medical history of low-grade myelodysplastic syndrome, vitamin B12 deficiency after small bowel obstruction in 12/20, HTN, macular degeneration, paroxysmal atrial fibrillation on Xarelto.  Patient has been complaining to her family about fatigue and weakness recently.  She had an appointment with her oncologist where lab work was done.  Patient's hemoglobin was 5.8.  She was sent to the ER for transfusion.  There is no reports of GI bleeding.  The patient is occult stool positive in the ER.  Patient states in the ER, she is a poor historian.    Review of Systems:  The patient denies anorexia, fever, weight loss,, vision loss, decreased hearing, hoarseness, chest pain, syncope, dyspnea on exertion, peripheral edema, balance deficits, hemoptysis, abdominal pain, melena, hematochezia, severe indigestion/heartburn, hematuria, incontinence, genital sores, muscle weakness, suspicious skin lesions, transient blindness, difficulty walking, depression, unusual weight change, abnormal bleeding, enlarged lymph nodes, angioedema, and breast masses. 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   Past Surgical History:  Procedure Laterality Date   APPLICATION OF WOUND VAC Bilateral 02/01/2019   Procedure: APPLICATION OF WOUND VAC;  Surgeon: [name], MD;  Location: [facility];  Service: General;  Laterality: Bilateral;   BREAST BIOPSY Right    BREAST LUMPECTOMY Right 1999   RADIATION   CARDIAC CATHETERIZATION   02/05/1999   HYPERDYNAMIC LEFT VENTRICLE WITH EF OF 75-80%   CARDIOVERSION N/A 01/22/2015   Procedure: CARDIOVERSION;  Surgeon: [name], MD;  Location: [facility];  Service: Cardiovascular;  Laterality: N/A;   CARDIOVERSION N/A 02/22/2015   Procedure: CARDIOVERSION;  Surgeon: [name], MD;  Location: [facility];  Service: Cardiovascular;  Laterality: N/A;   DILATION AND CURETTAGE OF UTERUS     ENUCLEATION Right 2012   EP IMPLANTABLE DEVICE N/A 02/25/2016   Procedure: Pacemaker Implant;  Surgeon: [name], MD;  Location: [facility];  Service: Cardiovascular;  Laterality: N/A;   INSERT / REPLACE / REMOVE PACEMAKER     LAPAROSCOPIC SMALL BOWEL RESECTION N/A 02/01/2019   Procedure: LAPAROSCOPIC SMALL BOWEL RESECTION WITH TAP BLOCK;  Surgeon: [name], MD;  Location: [facility];  Service: General;  Laterality: N/A;   LAPAROSCOPY N/A 02/01/2019   Procedure: LAPAROSCOPY DIAGNOSTIC;  Surgeon: [name], MD;  Location: [facility];  Service: General;  Laterality: N/A;   LYSIS OF ADHESION N/A 02/01/2019   Procedure: LYSIS OF ADHESION;  Surgeon: [name], MD;  Location: [facility];  Service: General;  Laterality: N/A;   TONSILLECTOMY      Medications: Prior to Admission medications   Medication Sig Start Date End Date Taking? Authorizing Provider  amiodarone (PACERONE) 200 MG tablet Take 200 mg by mouth daily with supper.   Yes [provider]  cetirizine (ZYRTEC) 10 MG tablet Take 10 mg by mouth every evening.   Yes [provider]  Cholecalciferol (VITAMIN D) 1000 UNITS capsule Take 1,000 Units by mouth daily with supper.  Yes [provider]  clotrimazole-betamethasone (LOTRISONE) cream Apply 1 application  topically 2 (two) times daily as needed (to affected areas). 12/31/20  Yes [provider]  Glucosamine HCl POWD Take 8.5 g by mouth See admin instructions. Mix 8.5 grams (0.5 scoopful) into juice and drink once a day, as  tolerated   Yes [provider]  Hypromellose (ARTIFICIAL TEARS OP) Place 1 drop into the left eye 3 (three) times daily as needed (for dryness).   Yes [provider]  Melatonin 10 MG TABS Take 10 mg by mouth at bedtime.   Yes [provider]  metoprolol succinate (TOPROL XL) 25 MG 24 hr tablet Take 1 tablet (25 mg total) by mouth daily. Patient taking differently: Take 25 mg by mouth in the morning. 05/30/21 05/31/22 Yes Donato Heinz, MD  Multiple Vitamins-Minerals (PRESERVISION/LUTEIN PO) Take 1 capsule by mouth 2 (two) times daily.    Yes [provider]  Polyethyl Glycol-Propyl Glycol (SYSTANE OP) Place 1 drop into the right eye daily as needed (for dryness).   Yes [provider]  polyethylene glycol (MIRALAX / GLYCOLAX) packet Take 8.5 g by mouth See admin instructions. Mix 8.5 grams (0.5 scoopful) into suggested amount of water and drink once a day- HOLD FOR DIARRHEA   Yes [provider]  QUEtiapine (SEROQUEL) 25 MG tablet Take 25 mg by mouth at bedtime. 12/11/19  Yes [provider]  sertraline (ZOLOFT) 100 MG tablet Take 100 mg by mouth at bedtime.   Yes [provider]  XARELTO 15 MG TABS tablet TAKE ONE TABLET BY MOUTH ONCE DAILY WITH SUPPER Patient taking differently: Take 15 mg by mouth daily after supper. 09/24/21  Yes Donato Heinz, MD  gabapentin (NEURONTIN) 100 MG capsule Take 1 capsule (100 mg total) by mouth 3 (three) times daily as needed for up to 30 doses. Patient not taking: Reported on 01/14/2022 01/07/22   Wyvonnia Dusky, MD  methylPREDNISolone (MEDROL DOSEPAK) 4 MG TBPK tablet Use as directed on package Patient not taking: Reported on 01/14/2022 01/07/22   Wyvonnia Dusky, MD    Allergies:   Allergies  Allergen Reactions   Shellfish Allergy Hives, Swelling and Other (See Comments)    Shrimp and lobster only   Atropine Other (See Comments)    Causes A-FIB   Codeine Other  (See Comments)    Leg pain   Doxycycline Other (See Comments)    Unknown reaction   Erythromycin Other (See Comments)    Unknown reaction   Fenoprofen Calcium Swelling   Iodinated Contrast Media Hives and Other (See Comments)   Isopto Hyoscine [Scopolamine]     Causes AFIB   Naproxen Other (See Comments)    Unknown reaction   Other Swelling and Other (See Comments)    Nutrasweet - Bleeding Naphon - swelling   Oxycodone Other (See Comments)    Depression - pt tolerates as needed    Penicillins     Has patient had a PCN reaction causing immediate rash, facial/tongue/throat swelling, SOB or lightheadedness with hypotension: unknown Has patient had a PCN reaction causing severe rash involving mucus membranes or skin necrosis: unknown Has patient had a PCN reaction that required hospitalization unknown Has patient had a PCN reaction occurring within the last 10 years: childhood If all of the above answers are "NO", then may proceed with Cephalosporin use. unknown   Sulfa Drugs Cross Reactors Swelling and Other (See Comments)    Ankle swelling  Warfarin Other (See Comments)    Bleeding from eyes   Ceclor [Cefaclor] Hives   Levaquin [Levofloxacin] Rash and Other (See Comments)    Rash & causes AFIB   Tape Rash    Use paper tape    Social History:  reports that she has never smoked. She has never used smokeless tobacco. She reports current alcohol use. She reports that she does not use drugs.  Family History: Family History  Problem Relation Age of Onset   Hypertension Father    Heart attack Father    Parkinsonism Father    Diabetes Brother    Heart disease Brother    Lung cancer Brother    Stroke Mother     Physical Exam: Vitals:   01/14/22 1900 01/14/22 1926 01/14/22 1933 01/14/22 1942  BP: (!) 149/67 (!) 157/73  (!) 136/96  Pulse: (!) 58 62  64  Resp: '14 16  15  '$ Temp:  97.9 F (36.6 C)  97.9 F (36.6 C)  TempSrc:  Oral  Oral  SpO2: 100% 100% 100% 100%   Weight:      Height:        General:  Alert and pleasant, well developed and nourished, no acute distress Eyes: Right eye prosthetic, blind, pale scleral icterus ENT: Moist oral mucosa, neck supple, no thyromegaly Lungs: clear to ascultation, no wheeze, no crackles, no use of accessory muscles Cardiovascular: regular rate and rhythm, no regurgitation, no gallops, no murmurs. No carotid bruits, no JVD Abdomen: soft, positive BS, non-tender, non-distended, no organomegaly, not an acute abdomen GU: not examined Neuro: CN II - XII grossly intact, sensation intact Musculoskeletal: strength 5/5 all extremities, no clubbing, cyanosis or edema Skin: Pale skin, no subcutaneous crepitation, no decubitus Psych: Pleasantly oriented patient  Labs on Admission:  Recent Labs    01/14/22 1546  NA 139  K 4.0  CL 110  CO2 22  GLUCOSE 115*  BUN 30*  CREATININE 1.28*  CALCIUM 8.7*   Recent Labs    01/14/22 1546  AST 17  ALT 14  ALKPHOS 115  BILITOT 0.9  PROT 6.5  ALBUMIN 3.5   No results for input(s): "LIPASE", "AMYLASE" in the last 72 hours. Recent Labs    01/14/22 1349  WBC 8.1  NEUTROABS 6.1  HGB 5.8*  HCT 17.7*  MCV 115.7*  PLT 263    Recent Labs    01/14/22 1349  VITAMINB12 317  FERRITIN 1,055*  TIBC 200*  IRON 52    Assessment/Plan Present on Admission: Symptomatic anemia/occult stool positive  MDS (myelodysplastic syndrome) (HCC) -Admit to telemetry -Transfusion 2 packed red blood cell ordered, -Serial H&H, posttransfusion H&H  -xarelto on hold -IV Protonix every 12 -GI Dr. Watt Climes contacted by ER physician, aware will see patient in a.m. -N.p.o., IV fluid hydration -MCV 115.7.  Vitamin B12 level, folate level   A-fib (HCC) -Amiodarone and metoprolol was resumed.  Xarelto on hold   Hypertension -Metoprolol resumed   Malnutrition of moderate degree -Nutrition consult   Cardiac pacemaker in place - [employer] [id number] [address] DR MRI  Ammara Raj,  Audry Pecina 01/14/2022, 7:55 PM

## 2022-01-14 NOTE — Progress Notes (Signed)
[facility] Telephone:([phone]) [phone]   [phone]) [phone]  OFFICE PROGRESS NOTE  [name], MD [place] [address]  DIAGNOSIS:  Low-grade myelodysplastic syndrome with ring sideroblasts.  There was also an element of vitamin B12 deficiency after small bowel resection for an obstruction in December 2020.  PRIOR THERAPY:None  CURRENT THERAPY:  1) Vitamin B12 injection initially weekly then every 2 weeks and then monthly. 2) supportive care with transfusion on as-needed basis  INTERVAL HISTORY: Megan Hester 88 y.o. female returns to the clinic today for follow-up visit accompanied by her daughter-in-law.  The patient has been complaining of increasing fatigue and weakness as well as low back pain over the last few weeks.  She also has dizzy spells and shortness of breath with exertion.  She denied having any current nausea, vomiting, diarrhea or constipation.  She has no headache but has some visual changes.  She is here today for evaluation and repeat blood work.  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  MEDICATIONS:  No current facility-administered medications for this visit.   Current Outpatient Medications  Medication Sig Dispense Refill   amiodarone (PACERONE) 200 MG  tablet Take 200 mg by mouth daily.     cetirizine (ZYRTEC) 10 MG tablet Take 10 mg by mouth daily with supper.     Cholecalciferol (VITAMIN D) 1000 UNITS capsule Take 1,000 Units by mouth daily with supper.     clotrimazole-betamethasone (LOTRISONE) cream Apply 1 application topically 2 (two) times daily. Female parts     gabapentin (NEURONTIN) 100 MG capsule Take 1 capsule (100 mg total) by mouth 3 (three) times daily as needed for up to 30 doses. 30 capsule 0   Glucosamine HCl POWD by Does not apply route.     Hypromellose (ARTIFICIAL TEARS OP) Place 1 drop into the left eye daily as needed (dry eye).     Melatonin 10 MG TABS Take 10 mg by mouth daily in the afternoon. Supper and bedime     methylPREDNISolone (MEDROL DOSEPAK) 4 MG TBPK tablet Use as directed on package 21 tablet 0   metoprolol succinate (TOPROL XL) 25 MG 24 hr tablet Take 1 tablet (25 mg total) by mouth daily. 90 tablet 3   Multiple Vitamins-Minerals (MULTIVITAMIN WITH MINERALS) tablet Take 1 tablet by mouth 2 (two) times daily.     Multiple Vitamins-Minerals (PRESERVISION/LUTEIN PO) Take 1 capsule by mouth 2 (two) times daily.      Polyethyl Glycol-Propyl Glycol (SYSTANE OP) Place 1 drop into the right eye daily as needed (dry eye). gel     polyethylene glycol (MIRALAX / GLYCOLAX) packet Take 17 g by mouth every evening.     QUEtiapine (SEROQUEL) 25 MG tablet Take 25 mg by mouth at bedtime.     sertraline (ZOLOFT) 100 MG tablet Take  100 mg by mouth at bedtime.     XARELTO 15 MG TABS tablet TAKE ONE TABLET BY MOUTH ONCE DAILY WITH SUPPER 30 tablet 5   Facility-Administered Medications Ordered in Other Visits  Medication Dose Route Frequency Provider Last Rate Last Admin   0.9 %  sodium chloride infusion  10 mL/hr Intravenous Once Margarita Mail, PA-C        SURGICAL HISTORY:  Past Surgical History:  Procedure Laterality Date   APPLICATION OF WOUND VAC Bilateral 02/01/2019   Procedure: APPLICATION OF WOUND VAC;  Surgeon:  [name], MD;  Location: [facility];  Service: General;  Laterality: Bilateral;   BREAST BIOPSY Right    BREAST LUMPECTOMY Right 1999   RADIATION   CARDIAC CATHETERIZATION  02/05/1999   HYPERDYNAMIC LEFT VENTRICLE WITH EF OF 75-80%   CARDIOVERSION N/A 01/22/2015   Procedure: CARDIOVERSION;  Surgeon: [name], MD;  Location: [facility];  Service: Cardiovascular;  Laterality: N/A;   CARDIOVERSION N/A 02/22/2015   Procedure: CARDIOVERSION;  Surgeon: [name], MD;  Location: [facility];  Service: Cardiovascular;  Laterality: N/A;   DILATION AND CURETTAGE OF UTERUS     ENUCLEATION Right 2012   EP IMPLANTABLE DEVICE N/A 02/25/2016   Procedure: Pacemaker Implant;  Surgeon: [name], MD;  Location: [facility];  Service: Cardiovascular;  Laterality: N/A;   INSERT / REPLACE / REMOVE PACEMAKER     LAPAROSCOPIC SMALL BOWEL RESECTION N/A 02/01/2019   Procedure: LAPAROSCOPIC SMALL BOWEL RESECTION WITH TAP BLOCK;  Surgeon: [name], MD;  Location: [facility];  Service: General;  Laterality: N/A;   LAPAROSCOPY N/A 02/01/2019   Procedure: LAPAROSCOPY DIAGNOSTIC;  Surgeon: [name], MD;  Location: [facility];  Service: General;  Laterality: N/A;   LYSIS OF ADHESION N/A 02/01/2019   Procedure: LYSIS OF ADHESION;  Surgeon: [name], MD;  Location: [facility];  Service: General;  Laterality: N/A;   TONSILLECTOMY      REVIEW OF SYSTEMS:  A comprehensive review of systems was negative except for: Constitutional: positive for fatigue Respiratory: positive for dyspnea on exertion Musculoskeletal: positive for back pain Neurological: positive for dizziness and weakness   PHYSICAL EXAMINATION: General appearance: alert, cooperative, fatigued, and no distress Head: Normocephalic, without obvious abnormality, atraumatic Neck: no adenopathy, no JVD, supple, symmetrical, trachea midline, and thyroid not enlarged, symmetric, no tenderness/mass/nodules Lymph nodes: Cervical,  supraclavicular, and axillary nodes normal. Resp: clear to auscultation bilaterally Back: symmetric, no curvature. ROM normal. No CVA tenderness. Cardio: regular rate and rhythm, S1, S2 normal, no murmur, click, rub or gallop GI: soft, non-tender; bowel sounds normal; no masses,  no organomegaly Extremities: extremities normal, atraumatic, no cyanosis or edema  ECOG PERFORMANCE STATUS: 1 - Symptomatic but completely ambulatory  Blood pressure (!) 142/62, pulse 61, temperature 98.3 F (36.8 C), temperature source Oral, resp. rate 16, SpO2 99 %.  LABORATORY DATA: Lab Results  Component Value Date   WBC 8.1 01/14/2022   HGB 5.8 (LL) 01/14/2022   HCT 17.7 (L) 01/14/2022   MCV 115.7 (H) 01/14/2022   PLT 263 01/14/2022      Chemistry      Component Value Date/Time   NA 139 01/14/2022 1546   NA 141 12/04/2021 1414   K 4.0 01/14/2022 1546   CL 110 01/14/2022 1546   CO2 22 01/14/2022 1546   BUN 30 (H) 01/14/2022 1546   BUN 19 12/04/2021 1414   CREATININE 1.28 (H) 01/14/2022 1546   CREATININE 1.02 (H) 08/28/2020 1123  Component Value Date/Time   CALCIUM 8.7 (L) 01/14/2022 1546   ALKPHOS 115 01/14/2022 1546   AST 17 01/14/2022 1546   AST 13 (L) 08/28/2020 1123   ALT 14 01/14/2022 1546   ALT 7 08/28/2020 1123   BILITOT 0.9 01/14/2022 1546   BILITOT 0.5 12/04/2021 1414   BILITOT 0.8 08/28/2020 1123       RADIOGRAPHIC STUDIES: No results found.  ASSESSMENT AND PLAN: This is a very pleasant 88 years old white female with recently diagnosed low-grade myelodysplastic syndrome in addition to questionable vitamin B12 deficiency.   The patient had a bone marrow biopsy and aspirate performed recently.   The cytogenetics showed no 5 q. deletion. The patient is currently on observation with supportive transfusion on as-needed basis. Repeat CBC today showed hemoglobin down to 5.8 with hematocrit of 17.7%.  We will try to arrange for the patient to receive PRBCs transfusion at the  cancer center but unfortunately it was too late to get her any transfusion in the clinic today. I recommended for the patient to go immediately to the emergency department for consideration of 2 units of PRBCs transfusion. Her iron study today is unremarkable. I will see her back for follow-up visit in 1 months for evaluation and repeat blood work but she was advised to call sooner if she has any concerning symptoms in the interval. The patient voices understanding of current disease status and treatment options and is in agreement with the current care plan.  All questions were answered. The patient knows to call the clinic with any problems, questions or concerns. We can certainly see the patient much sooner if necessary.   Disclaimer: This note was dictated with voice recognition software. Similar sounding words can inadvertently be transcribed and may not be corrected upon review.

## 2022-01-14 NOTE — ED Triage Notes (Signed)
Pt brought over from cancer center d/t low hgb 5.8.

## 2022-01-14 NOTE — ED Provider Triage Note (Signed)
Emergency Medicine Provider Triage Evaluation Note  Megan Hester , a 88 y.o. female  was evaluated in triage.  Pt complains of symptomatic anemia.  History of the same.  Patient has been having weakness, near syncope when trying to stand.  This been going on for about 2 weeks.  They thought it was due to her use of tramadol which she has been taking for back pain.  She denies a history of melena, hematochezia.  She is on Xarelto.  Her daughter states that this is due to lack of marrow production.  She is seen at hematology where she had a visit today and was sent here due to a hemoglobin of 5.8.   Review of Systems  Positive: weakness Negative: loc  Physical Exam  BP (!) 149/62   Pulse 61   Temp 98.2 F (36.8 C) (Oral)   Resp 18   Ht '5\' 4"'$  (1.626 m)   Wt 65 kg   SpO2 98%   BMI 24.60 kg/m  Gen:   Awake, no distress   Resp:  Normal effort  MSK:   Moves extremities without difficulty  Other:  Pale  Medical Decision Making  Medically screening exam initiated at 3:33 PM.  Appropriate orders placed.  Mariana Arn Mcfate was informed that the remainder of the evaluation will be completed by another provider, this initial triage assessment does not replace that evaluation, and the importance of remaining in the ED until their evaluation is complete.     Margarita Mail, PA-C 01/14/22 1535

## 2022-01-14 NOTE — ED Notes (Signed)
Pt is refusing the occult blood test.  She and her daughter in law are adamant her anemia is chronic. EDP made aware.

## 2022-01-14 NOTE — ED Provider Notes (Signed)
[facility] DEPT Provider Note   CSN: [id number] Arrival date & time: 01/14/22  1507     History  Chief Complaint  Patient presents with   Abnormal Lab    Megan Hester is a 88 y.o. female.   Abnormal Lab    88 year old female with history significant for paroxysmal atrial fibrillation on Xarelto, GERD, myelodysplastic syndrome, vitamin B12 deficiency after small bowel resection for obstruction in December 2020, who presents to the emergency department with low hemoglobin.  The patient had been in clinic today with Dr. Earlie Server her oncologist and has been complaining of fatigue and weakness as well as low back pain over the last few weeks.  She has been having episodes of lightheadedness as well as shortness of breath.    She was found to have a hemoglobin of 5.8 and was sent to the emergency department for blood transfusion.  The patient states that occasionally she will have episodes of hematochezia and melena.  She endorses bilateral flank tenderness but denies any current urinary symptoms.  Denies any fevers or chills.  Home Medications Prior to Admission medications   Medication Sig Start Date End Date Taking? Authorizing Provider  amiodarone (PACERONE) 200 MG tablet Take 200 mg by mouth daily with supper.   Yes [provider]  cetirizine (ZYRTEC) 10 MG tablet Take 10 mg by mouth every evening.   Yes [provider]  Cholecalciferol (VITAMIN D) 1000 UNITS capsule Take 1,000 Units by mouth daily with supper.   Yes [provider]  clotrimazole-betamethasone (LOTRISONE) cream Apply 1 application  topically 2 (two) times daily as needed (to affected areas). 12/31/20  Yes [provider]  Glucosamine HCl POWD Take 8.5 g by mouth See admin instructions. Mix 8.5 grams (0.5 scoopful) into juice and drink once a day, as tolerated   Yes [provider]  Hypromellose (ARTIFICIAL TEARS OP) Place 1 drop into the  left eye 3 (three) times daily as needed (for dryness).   Yes [provider]  Melatonin 10 MG TABS Take 10 mg by mouth at bedtime.   Yes [provider]  metoprolol succinate (TOPROL XL) 25 MG 24 hr tablet Take 1 tablet (25 mg total) by mouth daily. Patient taking differently: Take 25 mg by mouth in the morning. 05/30/21 05/31/22 Yes Donato Heinz, MD  Multiple Vitamins-Minerals (PRESERVISION/LUTEIN PO) Take 1 capsule by mouth 2 (two) times daily.    Yes [provider]  Polyethyl Glycol-Propyl Glycol (SYSTANE OP) Place 1 drop into the right eye daily as needed (for dryness).   Yes [provider]  polyethylene glycol (MIRALAX / GLYCOLAX) packet Take 8.5 g by mouth See admin instructions. Mix 8.5 grams (0.5 scoopful) into suggested amount of water and drink once a day- HOLD FOR DIARRHEA   Yes [provider]  QUEtiapine (SEROQUEL) 25 MG tablet Take 25 mg by mouth at bedtime. 12/11/19  Yes [provider]  sertraline (ZOLOFT) 100 MG tablet Take 100 mg by mouth at bedtime.   Yes [provider]  XARELTO 15 MG TABS tablet TAKE ONE TABLET BY MOUTH ONCE DAILY WITH SUPPER Patient taking differently: Take 15 mg by mouth daily after supper. 09/24/21  Yes Donato Heinz, MD  gabapentin (NEURONTIN) 100 MG capsule Take 1 capsule (100 mg total) by mouth 3 (three) times daily as needed for up to 30 doses. Patient not taking: Reported on 01/14/2022 01/07/22   Wyvonnia Dusky, MD  methylPREDNISolone (MEDROL DOSEPAK)  4 MG TBPK tablet Use as directed on package Patient not taking: Reported on 01/14/2022 01/07/22   Wyvonnia Dusky, MD      Allergies    Shellfish allergy, Atropine, Codeine, Doxycycline, Erythromycin, Fenoprofen calcium, Iodinated contrast media, Isopto hyoscine [scopolamine], Naproxen, Other, Oxycodone, Penicillins, Sulfa drugs cross reactors, Warfarin, Ceclor [cefaclor], Levaquin [levofloxacin], and Tape    Review of  Systems   Review of Systems  All other systems reviewed and are negative.   Physical Exam Updated Vital Signs BP (!) 140/112   Pulse 62   Temp 97.9 F (36.6 C) (Oral)   Resp 18   Ht '5\' 4"'$  (1.626 m)   Wt 65 kg   SpO2 99%   BMI 24.60 kg/m  Physical Exam Vitals and nursing note reviewed. Exam conducted with a chaperone present.  Constitutional:      General: She is not in acute distress.    Appearance: She is well-developed.  HENT:     Head: Normocephalic and atraumatic.  Eyes:     Conjunctiva/sclera: Conjunctivae normal.  Cardiovascular:     Rate and Rhythm: Normal rate and regular rhythm.  Pulmonary:     Effort: Pulmonary effort is normal. No respiratory distress.     Breath sounds: Normal breath sounds.  Abdominal:     Palpations: Abdomen is soft.     Tenderness: There is no abdominal tenderness. There is left CVA tenderness. There is no right CVA tenderness or guarding.  Genitourinary:    Rectum: Guaiac result positive.     Comments: External hemorrhoids present, not bleeding and non-thrombosed, no obvious melena or hematochezia, fecal occult positive Musculoskeletal:        General: No swelling.     Cervical back: Neck supple.  Skin:    General: Skin is warm and dry.     Capillary Refill: Capillary refill takes less than 2 seconds.  Neurological:     Mental Status: She is alert.  Psychiatric:        Mood and Affect: Mood normal.     ED Results / Procedures / Treatments   Labs (all labs ordered are listed, but only abnormal results are displayed) Labs Reviewed  COMPREHENSIVE METABOLIC PANEL - Abnormal; Notable for the following components:      Result Value   Glucose, Bld 115 (*)    BUN 30 (*)    Creatinine, Ser 1.28 (*)    Calcium 8.7 (*)    GFR, Estimated 40 (*)    All other components within normal limits  PROTIME-INR - Abnormal; Notable for the following components:   Prothrombin Time 19.7 (*)    INR 1.7 (*)    All other components within normal  limits  POC OCCULT BLOOD, ED - Abnormal; Notable for the following components:   Fecal Occult Bld POSITIVE (*)    All other components within normal limits  URINALYSIS, ROUTINE W REFLEX MICROSCOPIC  HEMOGLOBIN AND HEMATOCRIT, BLOOD  HEMOGLOBIN AND HEMATOCRIT, BLOOD  TYPE AND SCREEN  PREPARE RBC (CROSSMATCH)    EKG None  Radiology No results found.  Procedures .Critical Care  Performed by: Regan Lemming, MD Authorized by: Regan Lemming, MD   Critical care provider statement:    Critical care time (minutes):  30   Critical care was time spent personally by me on the following activities:  Development of treatment plan with patient or surrogate, discussions with consultants, evaluation of patient's response to treatment, examination of patient, ordering and review of laboratory studies, ordering and review  of radiographic studies, ordering and performing treatments and interventions, pulse oximetry, re-evaluation of patient's condition and review of old charts   Care discussed with: admitting provider       Medications Ordered in ED Medications  sodium chloride 0.9 % bolus 1,000 mL (0 mLs Intravenous Stopped 01/14/22 1912)    And  0.9 %  sodium chloride infusion ( Intravenous New Bag/Given 01/14/22 1803)  0.9 %  sodium chloride infusion (0 mL/hr Intravenous Stopped 01/14/22 1802)  methylPREDNISolone sodium succinate (SOLU-MEDROL) 40 mg/mL injection 40 mg (40 mg Intravenous Given 01/14/22 1757)  diphenhydrAMINE (BENADRYL) capsule 50 mg (50 mg Oral Given 01/14/22 1947)    Or  diphenhydrAMINE (BENADRYL) injection 50 mg ( Intravenous See Alternative 01/14/22 1947)  iohexol (OMNIPAQUE) 350 MG/ML injection 80 mL (80 mLs Intravenous Contrast Given 01/14/22 2245)    ED Course/ Medical Decision Making/ A&P                           Medical Decision Making Amount and/or Complexity of Data Reviewed Labs: ordered. Radiology: ordered.  Risk Prescription drug management. Decision  regarding hospitalization.    88 year old female with history significant for paroxysmal atrial fibrillation on Xarelto, GERD, myelodysplastic syndrome, vitamin B12 deficiency after small bowel resection for obstruction in December 2020, who presents to the emergency department with low hemoglobin.  The patient had been in clinic today with Dr. Earlie Server her oncologist and has been complaining of fatigue and weakness as well as low back pain over the last few weeks.  She has been having episodes of lightheadedness as well as shortness of breath.    She was found to have a hemoglobin of 5.8 and was sent to the emergency department for blood transfusion.  The patient states that occasionally she will have episodes of hematochezia and melena.  She endorses bilateral flank tenderness but denies any current urinary symptoms.  Denies any fevers or chills.  On arrival, the patient was afebrile, not tachycardic or tachypneic, BP 149/62, saturating 99% on room air.  Sinus rhythm noted on cardiac telemetry.  Physical exam significant for left-sided CVA tenderness, abdomen soft, nontender, nondistended, no rebound or guarding, GU exam concerning for external hemorrhoids, no hematochezia or melena, fecal occult positive.   Concern for lower GI bleed versus brisk upper GI bleed in the setting of the patient's home Xarelto use.  Additionally concern for symptomatic anemia.  The patient was consented for transfusion of 2 units PRBCs.    CT angio GI bleed was ordered however the patient has a contrast allergy and required premedication with Solu-Medrol and Benadryl.  CTA revealed: IMPRESSION: VASCULAR  No evidence of significant mesenteric vessel stenosis. No aneurysm or dissection.  Mild aortic atherosclerosis.  NON-VASCULAR  No active contrast extravasation.  Small right pleural effusion.  Layering high-density material in the gallbladder could reflect small stones or sludge.   I discussed the patient  with on-call [facility] gastroenterology, Dr. Watt Climes who will see the patient in consultation, no further immediate recommendations.  I consulted hospitalist medicine for admission.  The patient was updated bedside regarding the plan of care.   Final Clinical Impression(s) / ED Diagnoses Final diagnoses:  Symptomatic anemia  Gastrointestinal hemorrhage, unspecified gastrointestinal hemorrhage type    Rx / DC Orders ED Discharge Orders     None         Regan Lemming, MD 01/14/22 2321

## 2022-01-14 NOTE — ED Notes (Signed)
Pt had reported to EDP that she has had recent episodes of melena and rectal bleeding.

## 2022-01-15 ENCOUNTER — Ambulatory Visit: Payer: Medicare Other

## 2022-01-15 DIAGNOSIS — D649 Anemia, unspecified: Secondary | ICD-10-CM | POA: Diagnosis not present

## 2022-01-15 DIAGNOSIS — I4819 Other persistent atrial fibrillation: Secondary | ICD-10-CM | POA: Diagnosis not present

## 2022-01-15 DIAGNOSIS — D469 Myelodysplastic syndrome, unspecified: Secondary | ICD-10-CM

## 2022-01-15 LAB — URINALYSIS, ROUTINE W REFLEX MICROSCOPIC
Bilirubin Urine: NEGATIVE
Glucose, UA: NEGATIVE mg/dL
Hgb urine dipstick: NEGATIVE
Ketones, ur: NEGATIVE mg/dL
Nitrite: NEGATIVE
Protein, ur: NEGATIVE mg/dL
Specific Gravity, Urine: 1.012 (ref 1.005–1.030)
pH: 5 (ref 5.0–8.0)

## 2022-01-15 LAB — HEMOGLOBIN AND HEMATOCRIT, BLOOD
HCT: 23.7 % — ABNORMAL LOW (ref 36.0–46.0)
HCT: 24.7 % — ABNORMAL LOW (ref 36.0–46.0)
HCT: 24.9 % — ABNORMAL LOW (ref 36.0–46.0)
HCT: 25.7 % — ABNORMAL LOW (ref 36.0–46.0)
Hemoglobin: 7.6 g/dL — ABNORMAL LOW (ref 12.0–15.0)
Hemoglobin: 7.9 g/dL — ABNORMAL LOW (ref 12.0–15.0)
Hemoglobin: 8 g/dL — ABNORMAL LOW (ref 12.0–15.0)
Hemoglobin: 8.1 g/dL — ABNORMAL LOW (ref 12.0–15.0)

## 2022-01-15 LAB — BASIC METABOLIC PANEL
Anion gap: 6 (ref 5–15)
BUN: 27 mg/dL — ABNORMAL HIGH (ref 8–23)
CO2: 22 mmol/L (ref 22–32)
Calcium: 8.3 mg/dL — ABNORMAL LOW (ref 8.9–10.3)
Chloride: 114 mmol/L — ABNORMAL HIGH (ref 98–111)
Creatinine, Ser: 0.87 mg/dL (ref 0.44–1.00)
GFR, Estimated: >60 mL/min (ref >60)
Glucose, Bld: 137 mg/dL — ABNORMAL HIGH (ref 70–99)
Potassium: 4 mmol/L (ref 3.5–5.1)
Sodium: 142 mmol/L (ref 135–145)

## 2022-01-15 LAB — FOLATE: Folate: 25.7 ng/mL (ref >5.9)

## 2022-01-15 MED ORDER — PANTOPRAZOLE SODIUM 40 MG IV SOLR
40.0000 mg | Freq: Two times a day (BID) | INTRAVENOUS | Status: DC
  Administered 2022-01-15 – 2022-01-18 (×7): 40 mg via INTRAVENOUS
  Filled 2022-01-15 (×7): qty 10

## 2022-01-15 MED ORDER — ORAL CARE MOUTH RINSE: 15.0000 mL | OROMUCOSAL | Status: DC | PRN

## 2022-01-15 NOTE — Plan of Care (Signed)
  Problem: Coping: Goal: Level of anxiety will decrease Outcome: Progressing   Problem: Pain Managment: Goal: General experience of comfort will improve Outcome: Progressing   

## 2022-01-15 NOTE — Assessment & Plan Note (Addendum)
-   Xarelto resumed at discharge - continue toprol and amio

## 2022-01-15 NOTE — Assessment & Plan Note (Addendum)
-   Xarelto resumed at discharge

## 2022-01-15 NOTE — Consult Note (Signed)
Referring Provider: Dr. Sabino Gasser Primary Care Physician:  [name], MD Primary Gastroenterologist:  Althia Forts  Reason for Consultation:  Anemia; GI Bleed  HPI: Megan Hester is a 88 y.o. female on Xarelto for Afib who has been having fatigue and weakness and reports occasional melena but does not recall when the last time she had it and her son is not aware of it. Hgb 5.8 and heme positive. History of GERD.  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    Past Surgical History:  Procedure Laterality Date   APPLICATION OF WOUND VAC Bilateral 02/01/2019   Procedure: APPLICATION OF WOUND VAC;  Surgeon: [name], MD;  Location: [facility];  Service: General;  Laterality: Bilateral;   BREAST BIOPSY Right    BREAST LUMPECTOMY Right 1999   RADIATION   CARDIAC CATHETERIZATION  02/05/1999   HYPERDYNAMIC LEFT VENTRICLE WITH EF OF 75-80%   CARDIOVERSION N/A 01/22/2015   Procedure: CARDIOVERSION;  Surgeon: [name], MD;  Location: [facility];  Service: Cardiovascular;  Laterality: N/A;   CARDIOVERSION N/A 02/22/2015   Procedure: CARDIOVERSION;  Surgeon: [name], MD;  Location: [facility];  Service: Cardiovascular;  Laterality: N/A;   DILATION AND CURETTAGE OF UTERUS     ENUCLEATION Right 2012   EP IMPLANTABLE DEVICE N/A 02/25/2016   Procedure: Pacemaker Implant;  Surgeon: [name], MD;  Location: [facility];  Service: Cardiovascular;  Laterality: N/A;   INSERT / REPLACE / REMOVE PACEMAKER     LAPAROSCOPIC SMALL BOWEL RESECTION N/A 02/01/2019   Procedure: LAPAROSCOPIC SMALL BOWEL RESECTION WITH TAP BLOCK;  Surgeon:  [name], MD;  Location: [facility];  Service: General;  Laterality: N/A;   LAPAROSCOPY N/A 02/01/2019   Procedure: LAPAROSCOPY DIAGNOSTIC;  Surgeon: [name], MD;  Location: [facility];  Service: General;  Laterality: N/A;   LYSIS OF ADHESION N/A 02/01/2019   Procedure: LYSIS OF ADHESION;  Surgeon: [name], MD;  Location: [facility];  Service: General;  Laterality: N/A;   TONSILLECTOMY      Prior to Admission medications   Medication Sig Start Date End Date Taking? Authorizing Provider  amiodarone (PACERONE) 200 MG tablet Take 200 mg by mouth daily with supper.   Yes [provider]  cetirizine (ZYRTEC) 10 MG tablet Take 10 mg by mouth every evening.   Yes [provider]  Cholecalciferol (VITAMIN D) 1000 UNITS capsule Take 1,000 Units by mouth daily with supper.   Yes [provider]  clotrimazole-betamethasone (LOTRISONE) cream Apply 1 application  topically 2 (two) times daily as needed (to affected areas). 12/31/20  Yes [provider]  Glucosamine HCl POWD Take 8.5 g by mouth See admin instructions. Mix 8.5 grams (0.5 scoopful) into juice and drink once a day, as tolerated   Yes [provider]  Hypromellose (ARTIFICIAL TEARS OP) Place 1 drop into the left eye 3 (three) times daily as needed (for dryness).   Yes [provider]  Melatonin 10 MG TABS Take 10 mg by mouth at bedtime.   Yes [provider]  metoprolol  succinate (TOPROL XL) 25 MG 24 hr tablet Take 1 tablet (25 mg total) by mouth daily. Patient taking differently: Take 25 mg by mouth in the morning. 05/30/21 05/31/22 Yes Donato Heinz, MD  Multiple Vitamins-Minerals (PRESERVISION/LUTEIN PO) Take 1 capsule by mouth 2 (two) times daily.    Yes [provider]  Polyethyl Glycol-Propyl Glycol (SYSTANE OP) Place 1 drop into the right eye daily as needed (for dryness).   Yes [provider]  polyethylene glycol (MIRALAX / GLYCOLAX) packet Take  8.5 g by mouth See admin instructions. Mix 8.5 grams (0.5 scoopful) into suggested amount of water and drink once a day- HOLD FOR DIARRHEA   Yes [provider]  QUEtiapine (SEROQUEL) 25 MG tablet Take 25 mg by mouth at bedtime. 12/11/19  Yes [provider]  sertraline (ZOLOFT) 100 MG tablet Take 100 mg by mouth at bedtime.   Yes [provider]  XARELTO 15 MG TABS tablet TAKE ONE TABLET BY MOUTH ONCE DAILY WITH SUPPER Patient taking differently: Take 15 mg by mouth daily after supper. 09/24/21  Yes Donato Heinz, MD  gabapentin (NEURONTIN) 100 MG capsule Take 1 capsule (100 mg total) by mouth 3 (three) times daily as needed for up to 30 doses. Patient not taking: Reported on 01/14/2022 01/07/22   Wyvonnia Dusky, MD  methylPREDNISolone (MEDROL DOSEPAK) 4 MG TBPK tablet Use as directed on package Patient not taking: Reported on 01/14/2022 01/07/22   Wyvonnia Dusky, MD    Scheduled Meds:  amiodarone  200 mg Oral Q supper   melatonin  3 mg Oral QHS   metoprolol succinate  25 mg Oral Daily   multivitamin  1 tablet Oral BID   pantoprazole (PROTONIX) IV  40 mg Intravenous Q12H   QUEtiapine  25 mg Oral QHS   sertraline  100 mg Oral QHS   Continuous Infusions:  sodium chloride 75 mL/hr at 01/15/22 0421   PRN Meds:.acetaminophen **OR** acetaminophen, polyvinyl alcohol, senna-docusate  Allergies as of 01/14/2022 - Review Complete 01/14/2022  Allergen Reaction Noted   Shellfish allergy Hives, Swelling, and Other (See Comments)    Atropine Other (See Comments) 12/18/2014   Codeine Other (See Comments) 07/03/2010   Doxycycline Other (See Comments) 07/03/2010   Erythromycin Other (See Comments) 05/16/2020   Fenoprofen calcium Swelling 07/03/2010   Iodinated contrast media Hives and Other (See Comments) 07/03/2010   Isopto hyoscine [scopolamine]  12/18/2014   Naproxen Other (See Comments) 05/16/2020   Other Swelling and Other (See Comments) 12/18/2014    Oxycodone Other (See Comments)    Penicillins  07/03/2010   Sulfa drugs cross reactors Swelling and Other (See Comments) 07/03/2010   Warfarin Other (See Comments) 01/24/2015   Ceclor [cefaclor] Hives 07/03/2010   Levaquin [levofloxacin] Rash and Other (See Comments) 12/18/2014   Tape Rash 01/24/2015    Family History  Problem Relation Age of Onset   Hypertension Father    Heart attack Father    Parkinsonism Father    Diabetes Brother    Heart disease Brother    Lung cancer Brother    Stroke Mother     Social History   Socioeconomic History   Marital status: Married    Spouse name: Not on file   Number of children: 3   Years of education: Not on file   Highest education level: Not on file  Occupational History    Employer: RETIRED  Tobacco Use   Smoking status: Never   Smokeless tobacco: Never  Vaping Use   Vaping Use: Never used  Substance and Sexual Activity   Alcohol use: Yes    Alcohol/week: 0.0 standard drinks of alcohol    Comment: 02/25/2016 "glass of wine/month"   Drug use: No   Sexual activity: Yes    Birth control/protection: Post-menopausal    Comment: 1st intercourse 44 yo-1 partner  Other Topics Concern   Not on file  Social History Narrative   Lives in [place].  Retired Solicitor.   Social Determinants of Health   Financial Resource Strain: Not on file  Food Insecurity: Not on file  Transportation Needs: Not on file  Physical Activity: Not on file  Stress: Not on file  Social Connections: Not on file  Intimate Partner Violence: Not on file    Review of Systems: All negative except as stated above in HPI.  Physical Exam: Vital signs: Vitals:   01/15/22 0900 01/15/22 0915  BP: (!) 145/70   Pulse:  60  Resp: 14 13  Temp:    SpO2:  97%  T 97.7  Last BM Date : 01/14/22 General:   Lethargic, elderly, Well-developed, well-nourished, pleasant and cooperative in NAD Head: normocephalic, atraumatic Eyes: anicteric sclera ENT:  oropharynx clear Neck: supple, nontender Lungs:  Clear throughout to auscultation.   No wheezes, crackles, or rhonchi. No acute distress. Heart:  Regular rate and rhythm; no murmurs, clicks, rubs,  or gallops. Abdomen: soft, nontender, nondistended, +BS  Rectal:  Deferred Ext: no edema  GI:  Lab Results: Recent Labs    01/14/22 1349 01/15/22 0228 01/15/22 0422  WBC 8.1  --   --   HGB 5.8* 7.6* 7.9*  HCT 17.7* 23.7* 24.7*  PLT 263  --   --    BMET Recent Labs    01/14/22 1546 01/15/22 0422  NA 139 142  K 4.0 4.0  CL 110 114*  CO2 22 22  GLUCOSE 115* 137*  BUN 30* 27*  CREATININE 1.28* 0.87  CALCIUM 8.7* 8.3*   LFT Recent Labs    01/14/22 1546  PROT 6.5  ALBUMIN 3.5  AST 17  ALT 14  ALKPHOS 115  BILITOT 0.9   PT/INR Recent Labs    01/14/22 1756  LABPROT 19.7*  INR 1.7*     Studies/Results: CT ANGIO GI BLEED  Result Date: 01/14/2022 CLINICAL DATA:  Mesenteric ischemia, acute Pt with flank pain, hgb 5.8 EXAM: CTA ABDOMEN AND PELVIS WITHOUT AND WITH CONTRAST TECHNIQUE: Multidetector CT imaging of the abdomen and pelvis was performed using the standard protocol during bolus administration of intravenous contrast. Multiplanar reconstructed images and MIPs were obtained and reviewed to evaluate the vascular anatomy. RADIATION DOSE REDUCTION: This exam was performed according to the departmental dose-optimization program which includes automated exposure control, adjustment of the mA and/or kV according to patient size and/or use of iterative reconstruction technique. CONTRAST:  36m OMNIPAQUE IOHEXOL 350 MG/ML SOLN COMPARISON:  02/01/2019 FINDINGS: VASCULAR Aorta: Normal caliber aorta without aneurysm, dissection, vasculitis or significant stenosis. Scattered aortic atherosclerosis. Celiac: Patent without evidence of aneurysm, dissection, vasculitis or significant stenosis. SMA: Patent without evidence of aneurysm, dissection, vasculitis or significant stenosis.  Renals: Both renal arteries are patent without evidence of aneurysm, dissection, vasculitis, fibromuscular dysplasia or significant stenosis. IMA: Patent without evidence of aneurysm, dissection, vasculitis or significant stenosis. Inflow: Patent without evidence of aneurysm, dissection, vasculitis or significant stenosis. Scattered calcifications. Proximal Outflow: Bilateral common femoral and visualized portions of the superficial and profunda femoral arteries are patent without evidence of  aneurysm, dissection, vasculitis or significant stenosis. Veins: No obvious venous abnormality within the limitations of this arterial phase study. Review of the MIP images confirms the above findings. NON-VASCULAR Lower chest: Small right pleural effusion. Minimal bibasilar atelectasis. Pacer wires noted in the right heart. Hepatobiliary: Layering high-density material in the gallbladder, likely small stones or sludge. No biliary ductal dilatation or focal hepatic abnormality. Pancreas: No focal abnormality or ductal dilatation. Spleen: No focal abnormality.  Normal size. Adrenals/Urinary Tract: Bilateral renal cysts, the largest in the right midpole measuring 4.2 cm. These are not significantly changed since prior study. No follow-up imaging recommended. No hydronephrosis. Adrenal glands hand urinary bladder unremarkable. Stomach/Bowel: Stomach, large and small bowel grossly unremarkable. No changes to suggest ischemia. No visible active extravasation of contrast. Lymphatic: No adenopathy Reproductive: Uterus and adnexa unremarkable.  No mass. Other: No free fluid or free air. Musculoskeletal: No acute bony abnormality. IMPRESSION: VASCULAR No evidence of significant mesenteric vessel stenosis. No aneurysm or dissection. Mild aortic atherosclerosis. NON-VASCULAR No active contrast extravasation. Small right pleural effusion. Layering high-density material in the gallbladder could reflect small stones or sludge. Electronically  Signed   By: Rolm Baptise M.D.   On: 01/14/2022 23:14    Impression/Plan: 88 yo with symptomatic anemia and heme positive stool on Xarelto. No overt bleeding. EGD prior to restarting Xarelto. IV Protonix Q 12 hours. Clear liquid diet. NPO p MN if schedule time opens for tomorrow. Supportive care.    LOS: 1 day   [name]  01/15/2022, 10:06 AM  Questions please call [phone]

## 2022-01-15 NOTE — Hospital Course (Addendum)
Megan Hester is an 88 yo female with PMH MDS, B12 deficiency, SBO, PAF, HTN, GERD, arthritis, macular degeneration/retinal hemorrhage who presented after advice from oncology office due to severe anemia.  Hgb 5.8 g/dL on admission and she underwent transfusion x 2 with PRBC. She was also found to be FOBT positive as well and GI was consulted. Xarelto was held on admission.  EGD was performed on 01/17/2022 revealing gastric ulcers with no stigmata of bleeding and patchy congestion, erosions, erythema in the gastric antrum. Diet was advanced and tolerated well.  She was continued on Protonix at discharge. Hemoglobin remained stable prior to discharge and was 9.6 g/dL at discharge.

## 2022-01-15 NOTE — Progress Notes (Signed)
Progress Note    Megan Hester   UUV:[id number]  DOB: [birth year]  DOA: 01/14/2022     1 PCP: [name], MD  Initial CC: low Hgb  Hospital Course: Megan Hester is an 88 yo female with PMH MDS, B12 deficiency, SBO, PAF, HTN, GERD, arthritis, macular degeneration/retinal hemorrhage who presented after advice from oncology office due to severe anemia.  Hgb 5.8 g/dL on admission and she underwent transfusion x 2 with PRBC. She was also found to be FOBT positive as well and GI was consulted. Xarelto was held on admission.   Interval History:  Seen in the ER this morning. Family present bedside. Reviewed imaging and labs bedside with family and patient.   Assessment and Plan: * Symptomatic anemia - known hx MDS with need for periodic transfusions - Hgb 5.8 g/dL on admission - s/p 2 units PRBC on admission - continue trending Hgb and transfuse as necessary - GI consulted on admission as well; tentative plan for EGD on 12/7  MDS (myelodysplastic syndrome) ([place]) - followed by Dr. Julien Nordmann - continue following CBC  DM (diabetes mellitus) ([place]) - repeat A1c  Cardiac pacemaker in place - [employer]onic [id number] Advisa DR MRI - chronic  Chronic anticoagulation - xarelto on hold   Hypertension - continue toprol   A-fib ([place]) - hold xarelto; last dose 12/4 - continue toprol and amio  Old records reviewed in assessment of this patient  Antimicrobials:   DVT prophylaxis:  SCDs Start: 01/14/22 2348   Code Status:   Code Status: Full Code  Mobility Assessment (last 72 hours)     Mobility Assessment     Row Name 01/15/22 07:44:32           Does patient have an order for bedrest or is patient medically unstable No - Continue assessment       What is the highest level of mobility based on the progressive mobility assessment? Level 4 (Walks with assist in room) - Balance while marching in place and cannot step forward and back - Complete                 Barriers to discharge: none Disposition Plan:  Home Status is: Inpt  Objective: Blood pressure (!) 167/75, pulse (!) 59, temperature 98.4 F (36.9 C), temperature source Oral, resp. rate 18, height '5\' 4"'$  (1.626 m), weight 65 kg, SpO2 98 %.  Examination:  Physical Exam Constitutional:      Appearance: Normal appearance.  HENT:     Head: Normocephalic and atraumatic.     Mouth/Throat:     Mouth: Mucous membranes are moist.  Eyes:     Comments: Opacified right eye  Cardiovascular:     Rate and Rhythm: Normal rate and regular rhythm.  Pulmonary:     Effort: Pulmonary effort is normal. No respiratory distress.     Breath sounds: Normal breath sounds. No wheezing.  Abdominal:     General: Bowel sounds are normal. There is no distension.     Palpations: Abdomen is soft.     Tenderness: There is no abdominal tenderness.  Musculoskeletal:        General: Normal range of motion.     Cervical back: Normal range of motion and neck supple.  Skin:    General: Skin is warm and dry.  Neurological:     General: No focal deficit present.     Mental Status: She is alert.  Psychiatric:        Mood and Affect: Mood  normal.      Consultants:  GI  Procedures:    Data Reviewed: Results for orders placed or performed during the hospital encounter of 01/14/22 (from the past 24 hour(s))  Urinalysis, Routine w reflex microscopic Urine, Clean Catch     Status: Abnormal   Collection Time: 01/14/22 11:44 PM  Result Value Ref Range   Color, Urine YELLOW YELLOW   APPearance CLEAR CLEAR   Specific Gravity, Urine 1.012 1.005 - 1.030   pH 5.0 5.0 - 8.0   Glucose, UA NEGATIVE NEGATIVE mg/dL   Hgb urine dipstick NEGATIVE NEGATIVE   Bilirubin Urine NEGATIVE NEGATIVE   Ketones, ur NEGATIVE NEGATIVE mg/dL   Protein, ur NEGATIVE NEGATIVE mg/dL   Nitrite NEGATIVE NEGATIVE   Leukocytes,Ua MODERATE (A) NEGATIVE   RBC / HPF 0-5 0 - 5 RBC/hpf   WBC, UA 6-10 0 - 5 WBC/hpf   Bacteria, UA RARE (A)  NONE SEEN   Squamous Epithelial / LPF 0-5 0 - 5   Hyaline Casts, UA PRESENT   Hemoglobin and hematocrit, blood     Status: Abnormal   Collection Time: 01/15/22  2:28 AM  Result Value Ref Range   Hemoglobin 7.6 (L) 12.0 - 15.0 g/dL   HCT 23.7 (L) 36.0 - 38.8 %  Basic metabolic panel     Status: Abnormal   Collection Time: 01/15/22  4:22 AM  Result Value Ref Range   Sodium 142 135 - 145 mmol/L   Potassium 4.0 3.5 - 5.1 mmol/L   Chloride 114 (H) 98 - 111 mmol/L   CO2 22 22 - 32 mmol/L   Glucose, Bld 137 (H) 70 - 99 mg/dL   BUN 27 (H) 8 - 23 mg/dL   Creatinine, Ser 0.87 0.44 - 1.00 mg/dL   Calcium 8.3 (L) 8.9 - 10.3 mg/dL   GFR, Estimated >60 >60 mL/min   Anion gap 6 5 - 15  Hemoglobin and hematocrit, blood     Status: Abnormal   Collection Time: 01/15/22  4:22 AM  Result Value Ref Range   Hemoglobin 7.9 (L) 12.0 - 15.0 g/dL   HCT 24.7 (L) 36.0 - 46.0 %  Folate     Status: None   Collection Time: 01/15/22  4:22 AM  Result Value Ref Range   Folate 25.7 >5.9 ng/mL  BLOOD TRANSFUSION REPORT - SCANNED     Status: None   Collection Time: 01/15/22 10:23 AM   Narrative   Ordered by an unspecified provider.  Hemoglobin and hematocrit, blood     Status: Abnormal   Collection Time: 01/15/22  1:06 PM  Result Value Ref Range   Hemoglobin 8.0 (L) 12.0 - 15.0 g/dL   HCT 24.9 (L) 36.0 - 46.0 %    I have Reviewed nursing notes, Vitals, and Lab results since pt's last encounter. Pertinent lab results : see above I have ordered test including BMP, CBC, Mg I have reviewed the last note from staff over past 24 hours I have discussed pt's care plan and test results with nursing staff, case manager  Time spent: Greater than 50% of the 55 minute visit was spent in counseling/coordination of care for the patient as laid out in the A&P.    LOS: 1 day   Dwyane Dee, MD [facility] 01/15/2022, 6:15 PM

## 2022-01-15 NOTE — Assessment & Plan Note (Addendum)
-   followed by Dr. Julien Nordmann - continue following CBC outpatient

## 2022-01-15 NOTE — Assessment & Plan Note (Signed)
-   repeat A1c

## 2022-01-15 NOTE — Assessment & Plan Note (Addendum)
-   known hx MDS with need for periodic transfusions - Hgb 5.8 g/dL on admission - s/p 2 units PRBC on admission - continue trending Hgb and transfuse as necessary - GI consulted on admission as well -Underwent EGD on 01/17/2022: 2 nonbleeding cratered gastric ulcers with no stigmata of bleeding noted, largest lesion measuring 4 mm.  Patchy moderate inflammation with congestion, erosions, and erythema noted in gastric antrum -Hemoglobin down to 7.1 g/dL this morning.  Transfuse 1 unit PRBC - Follow-up CBC in a.m.

## 2022-01-15 NOTE — Assessment & Plan Note (Signed)
-   continue toprol

## 2022-01-15 NOTE — Assessment & Plan Note (Signed)
chronic

## 2022-01-16 DIAGNOSIS — D649 Anemia, unspecified: Secondary | ICD-10-CM | POA: Diagnosis not present

## 2022-01-16 DIAGNOSIS — D469 Myelodysplastic syndrome, unspecified: Secondary | ICD-10-CM | POA: Diagnosis not present

## 2022-01-16 LAB — BASIC METABOLIC PANEL
Anion gap: 7 (ref 5–15)
BUN: 21 mg/dL (ref 8–23)
CO2: 21 mmol/L — ABNORMAL LOW (ref 22–32)
Calcium: 8.5 mg/dL — ABNORMAL LOW (ref 8.9–10.3)
Chloride: 117 mmol/L — ABNORMAL HIGH (ref 98–111)
Creatinine, Ser: 0.87 mg/dL (ref 0.44–1.00)
GFR, Estimated: >60 mL/min (ref >60)
Glucose, Bld: 102 mg/dL — ABNORMAL HIGH (ref 70–99)
Potassium: 4.1 mmol/L (ref 3.5–5.1)
Sodium: 145 mmol/L (ref 135–145)

## 2022-01-16 LAB — HEMOGLOBIN A1C
Hgb A1c MFr Bld: 5.5 % (ref 4.8–5.6)
Mean Plasma Glucose: 111 mg/dL

## 2022-01-16 LAB — HEMOGLOBIN AND HEMATOCRIT, BLOOD
HCT: 25.9 % — ABNORMAL LOW (ref 36.0–46.0)
HCT: 25.4 % — ABNORMAL LOW (ref 36.0–46.0)
HCT: 23 % — ABNORMAL LOW (ref 36.0–46.0)
Hemoglobin: 8.1 g/dL — ABNORMAL LOW (ref 12.0–15.0)
Hemoglobin: 8 g/dL — ABNORMAL LOW (ref 12.0–15.0)
Hemoglobin: 7.4 g/dL — ABNORMAL LOW (ref 12.0–15.0)

## 2022-01-16 LAB — MAGNESIUM: Magnesium: 2.3 mg/dL (ref 1.7–2.4)

## 2022-01-16 NOTE — H&P (View-Only) (Signed)
[facility] Progress Note  Megan Hester 88 y.o. August 29, 1930   Subjective: Feeling ok. Diarrhea yesterday. Denies abdominal pain. Son in room.  Objective: Vital signs: Vitals:   01/16/22 0233 01/16/22 0649  BP: (!) 156/76 (!) 148/72  Pulse: (!) 59 62  Resp: 17 17  Temp: 97.8 F (36.6 C) 97.8 F (36.6 C)  SpO2: 95% 95%    Physical Exam: Gen: lethargic, elderly, no acute distress, pleasant HEENT: anicteric sclera CV: RRR Chest: CTA B Abd: soft, nontender, nondistended, +BS Ext: no edema  Lab Results: Recent Labs    01/15/22 0422 01/16/22 0340  NA 142 145  K 4.0 4.1  CL 114* 117*  CO2 22 21*  GLUCOSE 137* 102*  BUN 27* 21  CREATININE 0.87 0.87  CALCIUM 8.3* 8.5*  MG  --  2.3   Recent Labs    01/14/22 1546  AST 17  ALT 14  ALKPHOS 115  BILITOT 0.9  PROT 6.5  ALBUMIN 3.5   Recent Labs    01/14/22 1349 01/15/22 0228 01/15/22 2050 01/16/22 0340  WBC 8.1  --   --   --   NEUTROABS 6.1  --   --   --   HGB 5.8*   < > 8.1* 8.1*  HCT 17.7*   < > 25.7* 25.9*  MCV 115.7*  --   --   --   PLT 263  --   --   --    < > = values in this interval not displayed.      Assessment/Plan: Symptomatic anemia - no overt bleeding. Heme positive. Xarelto on hold. EGD tomorrow. Clear liquid diet. NPO p MN.   [name] 01/16/2022, 11:07 AM  Questions please call [phone] ID: Megan Hester, female   DOB: 1930/08/23, 88 y.o.   MRN: [id number]

## 2022-01-16 NOTE — Progress Notes (Signed)
[facility] Progress Note  Megan Hester 88 y.o. 07-11-1930   Subjective: Feeling ok. Diarrhea yesterday. Denies abdominal pain. Son in room.  Objective: Vital signs: Vitals:   01/16/22 0233 01/16/22 0649  BP: (!) 156/76 (!) 148/72  Pulse: (!) 59 62  Resp: 17 17  Temp: 97.8 F (36.6 C) 97.8 F (36.6 C)  SpO2: 95% 95%    Physical Exam: Gen: lethargic, elderly, no acute distress, pleasant HEENT: anicteric sclera CV: RRR Chest: CTA B Abd: soft, nontender, nondistended, +BS Ext: no edema  Lab Results: Recent Labs    01/15/22 0422 01/16/22 0340  NA 142 145  K 4.0 4.1  CL 114* 117*  CO2 22 21*  GLUCOSE 137* 102*  BUN 27* 21  CREATININE 0.87 0.87  CALCIUM 8.3* 8.5*  MG  --  2.3   Recent Labs    01/14/22 1546  AST 17  ALT 14  ALKPHOS 115  BILITOT 0.9  PROT 6.5  ALBUMIN 3.5   Recent Labs    01/14/22 1349 01/15/22 0228 01/15/22 2050 01/16/22 0340  WBC 8.1  --   --   --   NEUTROABS 6.1  --   --   --   HGB 5.8*   < > 8.1* 8.1*  HCT 17.7*   < > 25.7* 25.9*  MCV 115.7*  --   --   --   PLT 263  --   --   --    < > = values in this interval not displayed.      Assessment/Plan: Symptomatic anemia - no overt bleeding. Heme positive. Xarelto on hold. EGD tomorrow. Clear liquid diet. NPO p MN.   Megan Hester 01/16/2022, 11:07 AM  Questions please call [phone] ID: Megan Hester, female   DOB: [birth year], 88 y.o.   MRN: [id number]

## 2022-01-16 NOTE — Progress Notes (Signed)
Progress Note    Megan Hester   TIW:[id number]  DOB: 1930-11-25  DOA: 01/14/2022     2 PCP: [name], MD  Initial CC: low Hgb  Hospital Course: Megan Hester is an 88 yo female with PMH MDS, B12 deficiency, SBO, PAF, HTN, GERD, arthritis, macular degeneration/retinal hemorrhage who presented after advice from oncology office due to severe anemia.  Hgb 5.8 g/dL on admission and she underwent transfusion x 2 with PRBC. She was also found to be FOBT positive as well and GI was consulted. Xarelto was held on admission.   Interval History:  No events overnight.  No further reports of bleeding.  Family present bedside this morning.  Assessment and Plan: * Symptomatic anemia - known hx MDS with need for periodic transfusions - Hgb 5.8 g/dL on admission - s/p 2 units PRBC on admission - continue trending Hgb and transfuse as necessary -Hgb holding steady today. 8.1 g/dL this morning  - GI consulted on admission as well; tentative plan for EGD on 12/8  MDS (myelodysplastic syndrome) ([place]) - followed by Dr. Julien Nordmann - continue following CBC  DM (diabetes mellitus) ([place]) - A1c 5.5%  Cardiac pacemaker in place - Medtronic [id number] Advisa DR MRI - chronic  Chronic anticoagulation - xarelto on hold   Hypertension - continue toprol   A-fib ([place]) - hold xarelto; last dose 12/4 - continue toprol and amio  Old records reviewed in assessment of this patient  Antimicrobials:   DVT prophylaxis:  SCDs Start: 01/14/22 2348   Code Status:   Code Status: Full Code  Mobility Assessment (last 72 hours)     Mobility Assessment     Row Name 01/16/22 0900 01/15/22 2300 01/15/22 1800 01/15/22 07:44:32     Does patient have an order for bedrest or is patient medically unstable No - Continue assessment No - Continue assessment No - Continue assessment No - Continue assessment    What is the highest level of mobility based on the progressive mobility assessment? Level 4  (Walks with assist in room) - Balance while marching in place and cannot step forward and back - Complete Level 3 (Stands with assist) - Balance while standing  and cannot march in place Level 3 (Stands with assist) - Balance while standing  and cannot march in place  d/t pain Level 4 (Walks with assist in room) - Balance while marching in place and cannot step forward and back - Complete    Is the above level different from baseline mobility prior to current illness? -- No - Consider discontinuing PT/OT -- --             Barriers to discharge: none Disposition Plan:  Home Fri or Sat Status is: Inpt  Objective: Blood pressure (!) 148/72, pulse 62, temperature 97.8 F (36.6 C), temperature source Oral, resp. rate 17, height '5\' 4"'$  (1.626 m), weight 65 kg, SpO2 95 %.  Examination:  Physical Exam Constitutional:      Appearance: Normal appearance.  HENT:     Head: Normocephalic and atraumatic.     Mouth/Throat:     Mouth: Mucous membranes are moist.  Eyes:     Comments: Opacified right eye  Cardiovascular:     Rate and Rhythm: Normal rate and regular rhythm.  Pulmonary:     Effort: Pulmonary effort is normal. No respiratory distress.     Breath sounds: Normal breath sounds. No wheezing.  Abdominal:     General: Bowel sounds are normal. There is no  distension.     Palpations: Abdomen is soft.     Tenderness: There is no abdominal tenderness.  Musculoskeletal:        General: Normal range of motion.     Cervical back: Normal range of motion and neck supple.  Skin:    General: Skin is warm and dry.  Neurological:     General: No focal deficit present.     Mental Status: She is alert.  Psychiatric:        Mood and Affect: Mood normal.      Consultants:  GI  Procedures:    Data Reviewed: Results for orders placed or performed during the hospital encounter of 01/14/22 (from the past 24 hour(s))  Hemoglobin and hematocrit, blood     Status: Abnormal   Collection Time:  01/15/22  8:50 PM  Result Value Ref Range   Hemoglobin 8.1 (L) 12.0 - 15.0 g/dL   HCT 25.7 (L) 36.0 - 46.0 %  Hemoglobin and hematocrit, blood     Status: Abnormal   Collection Time: 01/16/22  3:40 AM  Result Value Ref Range   Hemoglobin 8.1 (L) 12.0 - 15.0 g/dL   HCT 25.9 (L) 36.0 - 22.6 %  Basic metabolic panel     Status: Abnormal   Collection Time: 01/16/22  3:40 AM  Result Value Ref Range   Sodium 145 135 - 145 mmol/L   Potassium 4.1 3.5 - 5.1 mmol/L   Chloride 117 (H) 98 - 111 mmol/L   CO2 21 (L) 22 - 32 mmol/L   Glucose, Bld 102 (H) 70 - 99 mg/dL   BUN 21 8 - 23 mg/dL   Creatinine, Ser 0.87 0.44 - 1.00 mg/dL   Calcium 8.5 (L) 8.9 - 10.3 mg/dL   GFR, Estimated >60 >60 mL/min   Anion gap 7 5 - 15  Magnesium     Status: None   Collection Time: 01/16/22  3:40 AM  Result Value Ref Range   Magnesium 2.3 1.7 - 2.4 mg/dL  Hemoglobin and hematocrit, blood     Status: Abnormal   Collection Time: 01/16/22 12:35 PM  Result Value Ref Range   Hemoglobin 8.0 (L) 12.0 - 15.0 g/dL   HCT 25.4 (L) 36.0 - 46.0 %    I have Reviewed nursing notes, Vitals, and Lab results since pt's last encounter. Pertinent lab results : see above I have ordered test including BMP, CBC, Mg I have reviewed the last note from staff over past 24 hours I have discussed pt's care plan and test results with nursing staff, case manager  Time spent: Greater than 50% of the 55 minute visit was spent in counseling/coordination of care for the patient as laid out in the A&P.    LOS: 2 days   Dwyane Dee, MD [facility] 01/16/2022, 3:58 PM

## 2022-01-16 NOTE — Progress Notes (Signed)
  Transition of Care [facility]) Screening Note   Patient Details  Name: Megan Hester Date of Birth: 07-20-1930   Transition of Care [facility]) CM/SW Contact:    Lennart Pall, LCSW Phone Number: 01/16/2022, 11:36 AM    Transition of Care Department [facility]) has reviewed patient and no TOC needs have been identified at this time. We will continue to monitor patient advancement through interdisciplinary progression rounds. If new patient transition needs arise, please place a TOC consult.

## 2022-01-17 ENCOUNTER — Encounter (HOSPITAL_COMMUNITY)
Admission: EM | Disposition: A | Discharge status: Home / Self Care | Payer: Self-pay | Source: Ambulatory Visit | Attending: Internal Medicine

## 2022-01-17 ENCOUNTER — Inpatient Hospital Stay (HOSPITAL_COMMUNITY): Payer: Medicare Other | Admitting: Anesthesiology

## 2022-01-17 DIAGNOSIS — K259 Gastric ulcer, unspecified as acute or chronic, without hemorrhage or perforation: Secondary | ICD-10-CM | POA: Diagnosis not present

## 2022-01-17 DIAGNOSIS — D62 Acute posthemorrhagic anemia: Secondary | ICD-10-CM

## 2022-01-17 DIAGNOSIS — D469 Myelodysplastic syndrome, unspecified: Secondary | ICD-10-CM | POA: Diagnosis not present

## 2022-01-17 DIAGNOSIS — D649 Anemia, unspecified: Secondary | ICD-10-CM | POA: Diagnosis not present

## 2022-01-17 DIAGNOSIS — I4891 Unspecified atrial fibrillation: Secondary | ICD-10-CM | POA: Diagnosis not present

## 2022-01-17 DIAGNOSIS — K2900 Acute gastritis without bleeding: Secondary | ICD-10-CM

## 2022-01-17 DIAGNOSIS — I4819 Other persistent atrial fibrillation: Secondary | ICD-10-CM | POA: Diagnosis not present

## 2022-01-17 HISTORY — PX: ESOPHAGOGASTRODUODENOSCOPY (EGD) WITH PROPOFOL: SHX5813

## 2022-01-17 HISTORY — PX: BIOPSY: SHX5522

## 2022-01-17 LAB — BASIC METABOLIC PANEL
Anion gap: 6 (ref 5–15)
BUN: 16 mg/dL (ref 8–23)
CO2: 19 mmol/L — ABNORMAL LOW (ref 22–32)
Calcium: 8.1 mg/dL — ABNORMAL LOW (ref 8.9–10.3)
Chloride: 116 mmol/L — ABNORMAL HIGH (ref 98–111)
Creatinine, Ser: 0.81 mg/dL (ref 0.44–1.00)
GFR, Estimated: >60 mL/min (ref >60)
Glucose, Bld: 83 mg/dL (ref 70–99)
Potassium: 3.5 mmol/L (ref 3.5–5.1)
Sodium: 141 mmol/L (ref 135–145)

## 2022-01-17 LAB — PREPARE RBC (CROSSMATCH)

## 2022-01-17 LAB — HEMOGLOBIN AND HEMATOCRIT, BLOOD
HCT: 22.9 % — ABNORMAL LOW (ref 36.0–46.0)
HCT: 30.4 % — ABNORMAL LOW (ref 36.0–46.0)
Hemoglobin: 7.1 g/dL — ABNORMAL LOW (ref 12.0–15.0)
Hemoglobin: 9.9 g/dL — ABNORMAL LOW (ref 12.0–15.0)

## 2022-01-17 LAB — MAGNESIUM: Magnesium: 2 mg/dL (ref 1.7–2.4)

## 2022-01-17 SURGERY — ESOPHAGOGASTRODUODENOSCOPY (EGD) WITH PROPOFOL
Anesthesia: Monitor Anesthesia Care

## 2022-01-17 MED ORDER — SODIUM CHLORIDE 0.9% IV SOLUTION: Freq: Once | INTRAVENOUS | Status: AC

## 2022-01-17 MED ORDER — LIDOCAINE HCL (CARDIAC) PF 100 MG/5ML IV SOSY
PREFILLED_SYRINGE | INTRAVENOUS | Status: DC | PRN
  Administered 2022-01-17: 50 mg via INTRAVENOUS

## 2022-01-17 MED ORDER — ORAL CARE MOUTH RINSE: 15.0000 mL | OROMUCOSAL | Status: DC | PRN

## 2022-01-17 MED ORDER — PROPOFOL 10 MG/ML IV BOLUS
INTRAVENOUS | Status: DC | PRN
  Administered 2022-01-17 (×5): 20 mg via INTRAVENOUS

## 2022-01-17 SURGICAL SUPPLY — 15 items

## 2022-01-17 NOTE — Progress Notes (Signed)
Progress Note    Megan Hester   [id number]:[id number]  DOB: 1931/01/03  DOA: 01/14/2022     3 PCP: Megan Bunting, MD  Initial CC: low Hgb  Hospital Course: [name] is an 88 yo female with PMH MDS, B12 deficiency, SBO, PAF, HTN, GERD, arthritis, macular degeneration/retinal hemorrhage who presented after advice from oncology office due to severe anemia.  Hgb 5.8 g/dL on admission and she underwent transfusion x 2 with PRBC. She was also found to be FOBT positive as well and GI was consulted. Xarelto was held on admission.   Interval History:  No events overnight.  Seen in room this afternoon after undergoing EGD.  Tentative plan will be for discharge tomorrow.  Assessment and Plan: * Symptomatic anemia - known hx MDS with need for periodic transfusions - Hgb 5.8 g/dL on admission - s/p 2 units PRBC on admission - continue trending Hgb and transfuse as necessary - GI consulted on admission as well -Underwent EGD on 01/17/2022: 2 nonbleeding cratered gastric ulcers with no stigmata of bleeding noted, largest lesion measuring 4 mm.  Patchy moderate inflammation with congestion, erosions, and erythema noted in gastric antrum -Hemoglobin down to 7.1 g/dL this morning.  Transfuse 1 unit PRBC - Follow-up CBC in a.m.  MDS (myelodysplastic syndrome) ([place]) - followed by Dr. Julien Hester - continue following CBC  DM (diabetes mellitus) ([place]) - A1c 5.5%  Cardiac pacemaker in place - Medtronic A2DR01 Advisa DR MRI - chronic  Chronic anticoagulation - xarelto on hold   Hypertension - continue toprol   A-fib ([place]) - hold xarelto; last dose 12/4 - continue toprol and amio  Old records reviewed in assessment of this patient  Antimicrobials:   DVT prophylaxis:  SCDs Start: 01/14/22 2348   Code Status:   Code Status: Full Code  Mobility Assessment (last 72 hours)     Mobility Assessment     Row Name 01/16/22 2104 01/16/22 0900 01/15/22 2300 01/15/22 1800 01/15/22  07:44:32   Does patient have an order for bedrest or is patient medically unstable No - Continue assessment No - Continue assessment No - Continue assessment No - Continue assessment No - Continue assessment   What is the highest level of mobility based on the progressive mobility assessment? Level 4 (Walks with assist in room) - Balance while marching in place and cannot step forward and back - Complete Level 4 (Walks with assist in room) - Balance while marching in place and cannot step forward and back - Complete Level 3 (Stands with assist) - Balance while standing  and cannot march in place Level 3 (Stands with assist) - Balance while standing  and cannot march in place  d/t pain Level 4 (Walks with assist in room) - Balance while marching in place and cannot step forward and back - Complete   Is the above level different from baseline mobility prior to current illness? -- -- No - Consider discontinuing PT/OT -- --            Barriers to discharge: none Disposition Plan:  Home Saturday Status is: Inpt  Objective: Blood pressure (!) 155/67, pulse 60, temperature 98.5 F (36.9 C), temperature source Oral, resp. rate 16, height '5\' 4"'$  (1.626 m), weight 65 kg, SpO2 96 %.  Examination:  Physical Exam Constitutional:      Appearance: Normal appearance.  HENT:     Head: Normocephalic and atraumatic.     Mouth/Throat:     Mouth: Mucous membranes are moist.  Eyes:  Comments: Opacified right eye  Cardiovascular:     Rate and Rhythm: Normal rate and regular rhythm.  Pulmonary:     Effort: Pulmonary effort is normal. No respiratory distress.     Breath sounds: Normal breath sounds. No wheezing.  Abdominal:     General: Bowel sounds are normal. There is no distension.     Palpations: Abdomen is soft.     Tenderness: There is no abdominal tenderness.  Musculoskeletal:        General: Normal range of motion.     Cervical back: Normal range of motion and neck supple.  Skin:     General: Skin is warm and dry.  Neurological:     General: No focal deficit present.     Mental Status: She is alert.  Psychiatric:        Mood and Affect: Mood normal.      Consultants:  GI  Procedures:  01/17/2022: EGD  Data Reviewed: Results for orders placed or performed during the hospital encounter of 01/14/22 (from the past 24 hour(s))  Hemoglobin and hematocrit, blood     Status: Abnormal   Collection Time: 01/16/22  9:50 PM  Result Value Ref Range   Hemoglobin 7.4 (L) 12.0 - 15.0 g/dL   HCT 23.0 (L) 36.0 - 46.0 %  Hemoglobin and hematocrit, blood     Status: Abnormal   Collection Time: 01/17/22  3:49 AM  Result Value Ref Range   Hemoglobin 7.1 (L) 12.0 - 15.0 g/dL   HCT 22.9 (L) 36.0 - 24.0 %  Basic metabolic panel     Status: Abnormal   Collection Time: 01/17/22  3:49 AM  Result Value Ref Range   Sodium 141 135 - 145 mmol/L   Potassium 3.5 3.5 - 5.1 mmol/L   Chloride 116 (H) 98 - 111 mmol/L   CO2 19 (L) 22 - 32 mmol/L   Glucose, Bld 83 70 - 99 mg/dL   BUN 16 8 - 23 mg/dL   Creatinine, Ser 0.81 0.44 - 1.00 mg/dL   Calcium 8.1 (L) 8.9 - 10.3 mg/dL   GFR, Estimated >60 >60 mL/min   Anion gap 6 5 - 15  Magnesium     Status: None   Collection Time: 01/17/22  3:49 AM  Result Value Ref Range   Magnesium 2.0 1.7 - 2.4 mg/dL  Prepare RBC (crossmatch)     Status: None   Collection Time: 01/17/22  7:41 AM  Result Value Ref Range   Order Confirmation      ORDER PROCESSED BY BLOOD BANK Performed at [facility], [place] [address] [place], [place] [address]   Prepare RBC (crossmatch)     Status: None   Collection Time: 01/17/22 12:25 PM  Result Value Ref Range   Order Confirmation      ORDER PROCESSED BY BLOOD BANK Performed at [facility], [place] [address] [place], [place] [address]     I have Reviewed nursing notes, Vitals, and Lab results since pt's last encounter. Pertinent lab results : see above I have ordered test  including BMP, CBC, Mg I have reviewed the last note from staff over past 24 hours I have discussed pt's care plan and test results with nursing staff, case manager  Time spent: Greater than 50% of the 55 minute visit was spent in counseling/coordination of care for the patient as laid out in the A&P.    LOS: 3 days   Dwyane Dee, MD [facility] 01/17/2022,  3:27 PM

## 2022-01-17 NOTE — Anesthesia Preprocedure Evaluation (Addendum)
Anesthesia Evaluation  Patient identified by MRN, date of birth, ID band Patient awake    Reviewed: Allergy & Precautions, NPO status , Patient's Chart, lab work & pertinent test results  Airway Mallampati: III  TM Distance: >3 FB Neck ROM: Full    Dental  (+) Edentulous Upper, Dental Advisory Given, Poor Dentition   Pulmonary neg pulmonary ROS   Pulmonary exam normal breath sounds clear to auscultation       Cardiovascular hypertension, Normal cardiovascular exam+ dysrhythmias Atrial Fibrillation + pacemaker  Rhythm:Regular Rate:Normal  TTE 2022 1. Left ventricular ejection fraction, by estimation, is 50 to 55%. The  left ventricle has low normal function. The left ventricle abnormal septal  motion. Left ventricular diastolic parameters are consistent with Grade  III diastolic dysfunction  (restrictive).   2. Tricuspid valve regurgitation is mild to moderate.   3. The aortic valve is tricuspid. There is mild calcification of the  aortic valve. There is mild thickening of the aortic valve. Aortic valve  regurgitation is trivial. Mild aortic valve sclerosis is present, with no  evidence of aortic valve stenosis.   4. Aortic dilatation noted. There is mild dilatation of the ascending  aorta, measuring 42 mm.   5. The mitral valve is grossly normal. Trivial mitral valve  regurgitation. No evidence of mitral stenosis.   6. Left atrial size was mildly dilated.   7. Right atrial size was moderately dilated.   8. Right ventricular systolic function is normal. The right ventricular  size is mildly enlarged. There is mildly elevated pulmonary artery  systolic pressure. The estimated right ventricular systolic pressure is  53.7 mmHg.   9. The inferior vena cava is normal in size with greater than 50%  respiratory variability, suggesting right atrial pressure of 3 mmHg.       Neuro/Psych negative neurological ROS  negative psych  ROS   GI/Hepatic Neg liver ROS,GERD  ,,  Endo/Other  diabetes    Renal/GU negative Renal ROS  negative genitourinary   Musculoskeletal  (+) Arthritis ,    Abdominal   Peds  Hematology  (+) Blood dyscrasia, anemia Lab Results      Component                Value               Date                      WBC                      8.1                 01/14/2022                HGB                      7.1 (L)             01/17/2022                HCT                      22.9 (L)            01/17/2022                MCV  115.7 (H)           01/14/2022                PLT                      263                 01/14/2022              Anesthesia Other Findings   Reproductive/Obstetrics                             Anesthesia Physical Anesthesia Plan  ASA: 3  Anesthesia Plan: MAC   Post-op Pain Management:    Induction: Intravenous  PONV Risk Score and Plan: Propofol infusion and Treatment may vary due to age or medical condition  Airway Management Planned: Natural Airway  Additional Equipment:   Intra-op Plan:   Post-operative Plan:   Informed Consent: I have reviewed the patients History and Physical, chart, labs and discussed the procedure including the risks, benefits and alternatives for the proposed anesthesia with the patient or authorized representative who has indicated his/her understanding and acceptance.     Dental advisory given  Plan Discussed with: CRNA  Anesthesia Plan Comments:        Anesthesia Quick Evaluation

## 2022-01-17 NOTE — Op Note (Signed)
[facility] Patient Name: Megan Hester Procedure Date: 01/17/2022 MRN: [id number] Attending MD: [name] , MD, [id number] Date of Birth: July 17, 1930 CSN: [id number] Age: 88 Admit Type: Inpatient Procedure:                Upper GI endoscopy Indications:              Acute post hemorrhagic anemia, Heme positive stool Providers:                [name], MD, Carlyn Reichert, RN, Fransico Setters                            [place], Technician Referring MD:             hospital team Medicines:                Propofol per Anesthesia, Monitored Anesthesia Care Complications:            No immediate complications. Estimated Blood Loss:     Estimated blood loss was minimal. Procedure:                Pre-Anesthesia Assessment:                           - Prior to the procedure, a History and Physical                            was performed, and patient medications and                            allergies were reviewed. The patient's tolerance of                            previous anesthesia was also reviewed. The risks                            and benefits of the procedure and the sedation                            options and risks were discussed with the patient.                            All questions were answered, and informed consent                            was obtained. Prior Anticoagulants: The patient has                            taken no anticoagulant or antiplatelet agents. ASA                            Grade Assessment: III - A patient with severe                            systemic disease. After reviewing the risks and  benefits, the patient was deemed in satisfactory                            condition to undergo the procedure.                           After obtaining informed consent, the endoscope was                            passed under direct vision. Throughout the                            procedure, the  patient's blood pressure, pulse, and                            oxygen saturations were monitored continuously. The                            [id number]-[id number] ([id number] Olympus endoscope was introduced                            through the mouth, and advanced to the second part                            of duodenum. The upper GI endoscopy was                            accomplished without difficulty. The patient                            tolerated the procedure well. Scope In: Scope Out: Findings:      The examined esophagus was normal.      The Z-line was regular and was found 45 cm from the incisors.      Two non-bleeding cratered gastric ulcers with no stigmata of bleeding       were found in the gastric antrum. The largest lesion was 4 mm in largest       dimension.      Patchy moderate inflammation characterized by congestion (edema),       erosions and erythema was found in the gastric antrum. Biopsies were       taken with a cold forceps for histology. Estimated blood loss was       minimal.      The cardia and gastric fundus were normal on retroflexion.      The examined duodenum was normal. Impression:               - Normal esophagus.                           - Z-line regular, 45 cm from the incisors.                           - Non-bleeding gastric ulcers with no stigmata of  bleeding.                           - Acute gastritis. Biopsied.                           - Normal examined duodenum. Moderate Sedation:      N/A - MAC procedure Recommendation:           - Clear liquid diet.                           - Await pathology results. Procedure Code(s):        --- Professional ---                           [phone], Esophagogastroduodenoscopy, flexible,                            transoral; with biopsy, single or multiple Diagnosis Code(s):        --- Professional ---                           D62, Acute posthemorrhagic anemia                            K25.9, Gastric ulcer, unspecified as acute or                            chronic, without hemorrhage or perforation                           R19.5, Other fecal abnormalities                           K29.00, Acute gastritis without bleeding CPT copyright 2022 American Medical Association. All rights reserved. The codes documented in this report are preliminary and upon coder review may  be revised to meet current compliance requirements. [name], MD 01/17/2022 10:59:03 AM This report has been signed electronically. Number of Addenda: 0

## 2022-01-17 NOTE — Transfer of Care (Signed)
Immediate Anesthesia Transfer of Care Note  Patient: Megan Hester  Procedure(s) Performed: ESOPHAGOGASTRODUODENOSCOPY (EGD) WITH PROPOFOL BIOPSY  Patient Location: Short Stay  Anesthesia Type:MAC  Level of Consciousness: awake, alert , and patient cooperative  Airway & Oxygen Therapy: Patient Spontanous Breathing and Patient connected to nasal cannula oxygen  Post-op Assessment: Report given to RN and Patient moving all extremities X 4  Post vital signs: Reviewed and stable  Last Vitals:  Vitals Value Taken Time  BP    Temp    Pulse    Resp    SpO2      Last Pain:  Vitals:   01/17/22 1003  TempSrc: Temporal  PainSc: 5       Patients Stated Pain Goal: 3 (45/85/92 9244)  Complications: No notable events documented.

## 2022-01-17 NOTE — Interval H&P Note (Signed)
History and Physical Interval Note:  01/17/2022 10:19 AM  Megan Hester  has presented today for surgery, with the diagnosis of GI Bleed; Anemia.  The various methods of treatment have been discussed with the patient and family. After consideration of risks, benefits and other options for treatment, the patient has consented to  Procedure(s): ESOPHAGOGASTRODUODENOSCOPY (EGD) WITH PROPOFOL (N/A) as a surgical intervention.  The patient's history has been reviewed, patient examined, no change in status, stable for surgery.  I have reviewed the patient's chart and labs.  Questions were answered to the patient's satisfaction.     [name]

## 2022-01-17 NOTE — Plan of Care (Signed)
  Problem: Safety: Goal: Ability to remain free from injury will improve Outcome: Progressing   Problem: Pain Managment: Goal: General experience of comfort will improve Outcome: Progressing   Problem: Activity: Goal: Risk for activity intolerance will decrease Outcome: Progressing   

## 2022-01-18 DIAGNOSIS — D469 Myelodysplastic syndrome, unspecified: Secondary | ICD-10-CM | POA: Diagnosis not present

## 2022-01-18 DIAGNOSIS — D649 Anemia, unspecified: Secondary | ICD-10-CM | POA: Diagnosis not present

## 2022-01-18 LAB — BPAM RBC
Blood Product Expiration Date: 202401022359
Blood Product Expiration Date: 202401022359
Blood Product Expiration Date: 202401082359
Blood Product Expiration Date: 202401082359
ISSUE DATE / TIME: 202312051916
ISSUE DATE / TIME: 202312052312
ISSUE DATE / TIME: 202312080901
ISSUE DATE / TIME: 202312081548
Unit Type and Rh: 9500
Unit Type and Rh: 9500
Unit Type and Rh: 9500
Unit Type and Rh: 9500

## 2022-01-18 LAB — TYPE AND SCREEN
ABO/RH(D): O NEG
Antibody Screen: NEGATIVE
Unit division: 0
Unit division: 0
Unit division: 0
Unit division: 0

## 2022-01-18 LAB — BASIC METABOLIC PANEL
Anion gap: 7 (ref 5–15)
BUN: 14 mg/dL (ref 8–23)
CO2: 19 mmol/L — ABNORMAL LOW (ref 22–32)
Calcium: 8.2 mg/dL — ABNORMAL LOW (ref 8.9–10.3)
Chloride: 115 mmol/L — ABNORMAL HIGH (ref 98–111)
Creatinine, Ser: 0.77 mg/dL (ref 0.44–1.00)
GFR, Estimated: >60 mL/min (ref >60)
Glucose, Bld: 97 mg/dL (ref 70–99)
Potassium: 3.3 mmol/L — ABNORMAL LOW (ref 3.5–5.1)
Sodium: 141 mmol/L (ref 135–145)

## 2022-01-18 LAB — HEMOGLOBIN AND HEMATOCRIT, BLOOD
HCT: 29.8 % — ABNORMAL LOW (ref 36.0–46.0)
Hemoglobin: 9.6 g/dL — ABNORMAL LOW (ref 12.0–15.0)

## 2022-01-18 LAB — MAGNESIUM: Magnesium: 2.2 mg/dL (ref 1.7–2.4)

## 2022-01-18 MED ORDER — RIVAROXABAN 15 MG PO TABS: 15.0000 mg | ORAL_TABLET | Freq: Every day | ORAL | Status: DC

## 2022-01-18 MED ORDER — PANTOPRAZOLE SODIUM 40 MG PO TBEC: 40.0000 mg | DELAYED_RELEASE_TABLET | Freq: Two times a day (BID) | ORAL | 3 refills | Status: DC

## 2022-01-18 MED ORDER — POTASSIUM CHLORIDE CRYS ER 20 MEQ PO TBCR
40.0000 meq | EXTENDED_RELEASE_TABLET | Freq: Once | ORAL | Status: AC
  Administered 2022-01-18: 40 meq via ORAL
  Filled 2022-01-18: qty 2

## 2022-01-18 NOTE — Plan of Care (Signed)
  Problem: Health Behavior/Discharge Planning: Goal: Ability to manage health-related needs will improve Outcome: Progressing   Problem: Clinical Measurements: Goal: Ability to maintain clinical measurements within normal limits will improve Outcome: Progressing   Problem: Activity: Goal: Risk for activity intolerance will decrease Outcome: Progressing   

## 2022-01-18 NOTE — Discharge Summary (Signed)
Physician Discharge Summary   Megan Hester XBD:[id number] DOB: April 01, 1930 DOA: 01/14/2022  PCP: [name], MD  Admit date: 01/14/2022 Discharge date: 01/18/2022  Barriers to discharge: none  Admitted From: [facility] Disposition:  Home Discharging physician: Dwyane Dee, MD  Recommendations for Outpatient Follow-up:  Continue following with oncology  Home Health:  Equipment/Devices:   Discharge Condition: stable CODE STATUS: Full Diet recommendation:  Diet Orders (From admission, onward)     Start     Ordered   01/18/22 0725  Diet regular Room service appropriate? Yes; Fluid consistency: Thin  Diet effective now       Question Answer Comment  Room service appropriate? Yes   Fluid consistency: Thin      01/18/22 0724   01/18/22 0000  Diet general        01/18/22 1040            Hospital Course: Megan Hester is an 88 yo female with PMH MDS, B12 deficiency, SBO, PAF, HTN, GERD, arthritis, macular degeneration/retinal hemorrhage who presented after advice from oncology office due to severe anemia.  Hgb 5.8 g/dL on admission and she underwent transfusion x 2 with PRBC. She was also found to be FOBT positive as well and GI was consulted. Xarelto was held on admission.  EGD was performed on 01/17/2022 revealing gastric ulcers with no stigmata of bleeding and patchy congestion, erosions, erythema in the gastric antrum. Diet was advanced and tolerated well.  She was continued on Protonix at discharge. Hemoglobin remained stable prior to discharge and was 9.6 g/dL at discharge.  Assessment and Plan: * Symptomatic anemia - known hx MDS with need for periodic transfusions - Hgb 5.8 g/dL on admission - s/p 2 units PRBC on admission - continue trending Hgb and transfuse as necessary - GI consulted on admission as well -Underwent EGD on 01/17/2022: 2 nonbleeding cratered gastric ulcers with no stigmata of bleeding noted, largest lesion measuring 4 mm.  Patchy moderate  inflammation with congestion, erosions, and erythema noted in gastric antrum - continue protonix at discharge -Hemoglobin stable and improved at discharge, 9.6 g/dL  MDS (myelodysplastic syndrome) ([place]) - followed by Dr. Julien Nordmann - continue following CBC outpatient  DM (diabetes mellitus) ([place]) - A1c 5.5%  Cardiac pacemaker in place - [employer] [id number] [address] DR MRI - chronic  Chronic anticoagulation - Xarelto resumed at discharge  Hypertension - continue toprol   A-fib ([place]) - Xarelto resumed at discharge - continue toprol and amio       The patient's chronic medical conditions were treated accordingly per the patient's home medication regimen except as noted.  On day of discharge, patient was felt deemed stable for discharge. Patient/family member advised to call PCP or come back to ER if needed.   Principal Diagnosis: Symptomatic anemia  Discharge Diagnoses: Active Hospital Problems   Diagnosis Date Noted   Symptomatic anemia 01/14/2022    Priority: 1.   MDS (myelodysplastic syndrome) ([place]) 07/02/2021    Priority: 1.   Malnutrition of moderate degree 02/12/2019   Chronic anticoagulation 02/01/2019   Cardiac pacemaker in place - [place] DR MRI 02/01/2019   DM (diabetes mellitus) ([place]) 02/01/2019   Hypertension 09/12/2010   A-fib ([place]) 07/16/2010    Resolved Hospital Problems  No resolved problems to display.     Discharge Instructions     Diet general   Complete by: As directed    Increase activity slowly   Complete by: As directed       Allergies  as of 01/18/2022       Reactions   Shellfish Allergy Hives, Swelling, Other (See Comments)   Shrimp and lobster only   Atropine Other (See Comments)   Causes A-FIB   Codeine Other (See Comments)   Leg pain   Doxycycline Other (See Comments)   Unknown reaction   Erythromycin Other (See Comments)   Unknown reaction   Fenoprofen Calcium Swelling   Iodinated Contrast Media Hives, Other  (See Comments)   Isopto Hyoscine [scopolamine]    Causes AFIB   Naproxen Other (See Comments)   Unknown reaction   Other Swelling, Other (See Comments)   Nutrasweet - Bleeding Naphon - swelling   Oxycodone Other (See Comments)   Depression - pt tolerates as needed    Penicillins    Has patient had a PCN reaction causing immediate rash, facial/tongue/throat swelling, SOB or lightheadedness with hypotension: unknown Has patient had a PCN reaction causing severe rash involving mucus membranes or skin necrosis: unknown Has patient had a PCN reaction that required hospitalization unknown Has patient had a PCN reaction occurring within the last 10 years: childhood If all of the above answers are "NO", then may proceed with Cephalosporin use. unknown   Sulfa Drugs Cross Reactors Swelling, Other (See Comments)   Ankle swelling    Warfarin Other (See Comments)   Bleeding from eyes   Ceclor [cefaclor] Hives   Levaquin [levofloxacin] Rash, Other (See Comments)   Rash & causes AFIB   Tape Rash   Use paper tape        Medication List     STOP taking these medications    gabapentin 100 MG capsule Commonly known as: Neurontin   methylPREDNISolone 4 MG Tbpk tablet Commonly known as: MEDROL DOSEPAK       TAKE these medications    amiodarone 200 MG tablet Commonly known as: PACERONE Take 200 mg by mouth daily with supper.   ARTIFICIAL TEARS OP Place 1 drop into the left eye 3 (three) times daily as needed (for dryness).   cetirizine 10 MG tablet Commonly known as: ZYRTEC Take 10 mg by mouth every evening.   clotrimazole-betamethasone cream Commonly known as: LOTRISONE Apply 1 application  topically 2 (two) times daily as needed (to affected areas).   Glucosamine HCl Powd Take 8.5 g by mouth See admin instructions. Mix 8.5 grams (0.5 scoopful) into juice and drink once a day, as tolerated   Melatonin 10 MG Tabs Take 10 mg by mouth at bedtime.   metoprolol succinate 25  MG 24 hr tablet Commonly known as: Toprol XL Take 1 tablet (25 mg total) by mouth daily. What changed: when to take this   pantoprazole 40 MG tablet Commonly known as: Protonix Take 1 tablet (40 mg total) by mouth 2 (two) times daily.   polyethylene glycol 17 g packet Commonly known as: MIRALAX / GLYCOLAX Take 8.5 g by mouth See admin instructions. Mix 8.5 grams (0.5 scoopful) into suggested amount of water and drink once a day- HOLD FOR DIARRHEA   PRESERVISION/LUTEIN PO Take 1 capsule by mouth 2 (two) times daily.   QUEtiapine 25 MG tablet Commonly known as: SEROQUEL Take 25 mg by mouth at bedtime.   sertraline 100 MG tablet Commonly known as: ZOLOFT Take 100 mg by mouth at bedtime.   SYSTANE OP Place 1 drop into the right eye daily as needed (for dryness).   Vitamin D 1000 units capsule Take 1,000 Units by mouth daily with supper.   Xarelto  15 MG Tabs tablet Generic drug: Rivaroxaban TAKE ONE TABLET BY MOUTH ONCE DAILY WITH SUPPER What changed: See the new instructions.        Allergies  Allergen Reactions   Shellfish Allergy Hives, Swelling and Other (See Comments)    Shrimp and lobster only   Atropine Other (See Comments)    Causes A-FIB   Codeine Other (See Comments)    Leg pain   Doxycycline Other (See Comments)    Unknown reaction   Erythromycin Other (See Comments)    Unknown reaction   Fenoprofen Calcium Swelling   Iodinated Contrast Media Hives and Other (See Comments)   Isopto Hyoscine [Scopolamine]     Causes AFIB   Naproxen Other (See Comments)    Unknown reaction   Other Swelling and Other (See Comments)    Nutrasweet - Bleeding Naphon - swelling   Oxycodone Other (See Comments)    Depression - pt tolerates as needed    Penicillins     Has patient had a PCN reaction causing immediate rash, facial/tongue/throat swelling, SOB or lightheadedness with hypotension: unknown Has patient had a PCN reaction causing severe rash involving mucus  membranes or skin necrosis: unknown Has patient had a PCN reaction that required hospitalization unknown Has patient had a PCN reaction occurring within the last 10 years: childhood If all of the above answers are "NO", then may proceed with Cephalosporin use. unknown   Sulfa Drugs Cross Reactors Swelling and Other (See Comments)    Ankle swelling    Warfarin Other (See Comments)    Bleeding from eyes   Ceclor [Cefaclor] Hives   Levaquin [Levofloxacin] Rash and Other (See Comments)    Rash & causes AFIB   Tape Rash    Use paper tape    Consultations: GI  Procedures: 01/17/22: EGD  Discharge Exam: BP (!) 162/80 (BP Location: Left Arm)   Pulse 60   Temp 98 F (36.7 C) (Oral)   Resp 16   Ht '5\' 4"'$  (1.626 m)   Wt 65 kg   SpO2 95%   BMI 24.60 kg/m  Physical Exam Constitutional:      Appearance: Normal appearance.  HENT:     Head: Normocephalic and atraumatic.     Mouth/Throat:     Mouth: Mucous membranes are moist.  Eyes:     Comments: Opacified right eye  Cardiovascular:     Rate and Rhythm: Normal rate and regular rhythm.  Pulmonary:     Effort: Pulmonary effort is normal. No respiratory distress.     Breath sounds: Normal breath sounds. No wheezing.  Abdominal:     General: Bowel sounds are normal. There is no distension.     Palpations: Abdomen is soft.     Tenderness: There is no abdominal tenderness.  Musculoskeletal:        General: Normal range of motion.     Cervical back: Normal range of motion and neck supple.  Skin:    General: Skin is warm and dry.  Neurological:     General: No focal deficit present.     Mental Status: She is alert.  Psychiatric:        Mood and Affect: Mood normal.      The results of significant diagnostics from this hospitalization (including imaging, microbiology, ancillary and laboratory) are listed below for reference.   Microbiology: No results found for this or any previous visit (from the past 240 hour(s)).    Labs: BNP (last 3 results) Recent Labs  12/04/21 1414  BNP 401.0*   Basic Metabolic Panel: Recent Labs  Lab 01/14/22 1546 01/15/22 0422 01/16/22 0340 01/17/22 0349 01/18/22 0301  NA 139 142 145 141 141  K 4.0 4.0 4.1 3.5 3.3*  CL 110 114* 117* 116* 115*  CO2 22 22 21* 19* 19*  GLUCOSE 115* 137* 102* 83 97  BUN 30* 27* '21 16 14  '$ CREATININE 1.28* 0.87 0.87 0.81 0.77  CALCIUM 8.7* 8.3* 8.5* 8.1* 8.2*  MG  --   --  2.3 2.0 2.2   Liver Function Tests: Recent Labs  Lab 01/14/22 1546  AST 17  ALT 14  ALKPHOS 115  BILITOT 0.9  PROT 6.5  ALBUMIN 3.5   No results for input(s): "LIPASE", "AMYLASE" in the last 168 hours. No results for input(s): "AMMONIA" in the last 168 hours. CBC: Recent Labs  Lab 01/14/22 1349 01/15/22 0228 01/16/22 1235 01/16/22 2150 01/17/22 0349 01/17/22 2007 01/18/22 0301  WBC 8.1  --   --   --   --   --   --   NEUTROABS 6.1  --   --   --   --   --   --   HGB 5.8*   < > 8.0* 7.4* 7.1* 9.9* 9.6*  HCT 17.7*   < > 25.4* 23.0* 22.9* 30.4* 29.8*  MCV 115.7*  --   --   --   --   --   --   PLT 263  --   --   --   --   --   --    < > = values in this interval not displayed.   Cardiac Enzymes: No results for input(s): "CKTOTAL", "CKMB", "CKMBINDEX", "TROPONINI" in the last 168 hours. BNP: Invalid input(s): "POCBNP" CBG: No results for input(s): "GLUCAP" in the last 168 hours. D-Dimer No results for input(s): "DDIMER" in the last 72 hours. Hgb A1c Recent Labs    01/15/22 1306  HGBA1C 5.5   Lipid Profile No results for input(s): "CHOL", "HDL", "LDLCALC", "TRIG", "CHOLHDL", "LDLDIRECT" in the last 72 hours. Thyroid function studies No results for input(s): "TSH", "T4TOTAL", "T3FREE", "THYROIDAB" in the last 72 hours.  Invalid input(s): "FREET3" Anemia work up No results for input(s): "VITAMINB12", "FOLATE", "FERRITIN", "TIBC", "IRON", "RETICCTPCT" in the last 72 hours. Urinalysis    Component Value Date/Time   COLORURINE YELLOW  01/14/2022 2344   APPEARANCEUR CLEAR 01/14/2022 2344   LABSPEC 1.012 01/14/2022 2344   PHURINE 5.0 01/14/2022 2344   GLUCOSEU NEGATIVE 01/14/2022 2344   HGBUR NEGATIVE 01/14/2022 2344   BILIRUBINUR NEGATIVE 01/14/2022 2344   KETONESUR NEGATIVE 01/14/2022 2344   PROTEINUR NEGATIVE 01/14/2022 2344   UROBILINOGEN 0.2 08/23/2014 0951   NITRITE NEGATIVE 01/14/2022 2344   LEUKOCYTESUR MODERATE (A) 01/14/2022 2344   Sepsis Labs Recent Labs  Lab 01/14/22 1349  WBC 8.1   Microbiology No results found for this or any previous visit (from the past 240 hour(s)).  Procedures/Studies: CT ANGIO GI BLEED  Result Date: 01/14/2022 CLINICAL DATA:  Mesenteric ischemia, acute Pt with flank pain, hgb 5.8 EXAM: CTA ABDOMEN AND PELVIS WITHOUT AND WITH CONTRAST TECHNIQUE: Multidetector CT imaging of the abdomen and pelvis was performed using the standard protocol during bolus administration of intravenous contrast. Multiplanar reconstructed images and MIPs were obtained and reviewed to evaluate the vascular anatomy. RADIATION DOSE REDUCTION: This exam was performed according to the departmental dose-optimization program which includes automated exposure control, adjustment of the mA and/or kV according to patient size and/or use  of iterative reconstruction technique. CONTRAST:  57m OMNIPAQUE IOHEXOL 350 MG/ML SOLN COMPARISON:  02/01/2019 FINDINGS: VASCULAR Aorta: Normal caliber aorta without aneurysm, dissection, vasculitis or significant stenosis. Scattered aortic atherosclerosis. Celiac: Patent without evidence of aneurysm, dissection, vasculitis or significant stenosis. SMA: Patent without evidence of aneurysm, dissection, vasculitis or significant stenosis. Renals: Both renal arteries are patent without evidence of aneurysm, dissection, vasculitis, fibromuscular dysplasia or significant stenosis. IMA: Patent without evidence of aneurysm, dissection, vasculitis or significant stenosis. Inflow: Patent without  evidence of aneurysm, dissection, vasculitis or significant stenosis. Scattered calcifications. Proximal Outflow: Bilateral common femoral and visualized portions of the superficial and profunda femoral arteries are patent without evidence of aneurysm, dissection, vasculitis or significant stenosis. Veins: No obvious venous abnormality within the limitations of this arterial phase study. Review of the MIP images confirms the above findings. NON-VASCULAR Lower chest: Small right pleural effusion. Minimal bibasilar atelectasis. Pacer wires noted in the right heart. Hepatobiliary: Layering high-density material in the gallbladder, likely small stones or sludge. No biliary ductal dilatation or focal hepatic abnormality. Pancreas: No focal abnormality or ductal dilatation. Spleen: No focal abnormality.  Normal size. Adrenals/Urinary Tract: Bilateral renal cysts, the largest in the right midpole measuring 4.2 cm. These are not significantly changed since prior study. No follow-up imaging recommended. No hydronephrosis. Adrenal glands hand urinary bladder unremarkable. Stomach/Bowel: Stomach, large and small bowel grossly unremarkable. No changes to suggest ischemia. No visible active extravasation of contrast. Lymphatic: No adenopathy Reproductive: Uterus and adnexa unremarkable.  No mass. Other: No free fluid or free air. Musculoskeletal: No acute bony abnormality. IMPRESSION: VASCULAR No evidence of significant mesenteric vessel stenosis. No aneurysm or dissection. Mild aortic atherosclerosis. NON-VASCULAR No active contrast extravasation. Small right pleural effusion. Layering high-density material in the gallbladder could reflect small stones or sludge. Electronically Signed   By: KRolm BaptiseM.D.   On: 01/14/2022 23:14     Time coordinating discharge: Over 30 minutes    DDwyane Dee MD  [facility] 01/18/2022, 12:53 PM

## 2022-01-18 NOTE — Progress Notes (Signed)
Pt alert and oriented at this time. Discharge instructions discussed with pt and her daughter. No questions regarding discharge instructions. Belongings sent home with pt.

## 2022-01-19 NOTE — Anesthesia Postprocedure Evaluation (Signed)
Anesthesia Post Note  Patient: Megan Hester  Procedure(s) Performed: ESOPHAGOGASTRODUODENOSCOPY (EGD) WITH PROPOFOL BIOPSY     Patient location during evaluation: Endoscopy Anesthesia Type: MAC Level of consciousness: awake and alert Pain management: pain level controlled Vital Signs Assessment: post-procedure vital signs reviewed and stable Respiratory status: spontaneous breathing, nonlabored ventilation, respiratory function stable and patient connected to nasal cannula oxygen Cardiovascular status: blood pressure returned to baseline and stable Postop Assessment: no apparent nausea or vomiting Anesthetic complications: no  No notable events documented.  Last Vitals:  Vitals:   01/17/22 1949 01/18/22 0627  BP: (!) 169/74 (!) 162/80  Pulse: 60 60  Resp: 18 16  Temp: 36.6 C 36.7 C  SpO2: 94% 95%    Last Pain:  Vitals:   01/18/22 1022  TempSrc:   PainSc: Asleep                 Kell Ferris L [employer]if[employer] [place]

## 2022-01-20 ENCOUNTER — Encounter (HOSPITAL_COMMUNITY): Payer: Self-pay | Admitting: Gastroenterology

## 2022-01-20 LAB — SURGICAL PATHOLOGY

## 2022-02-20 ENCOUNTER — Encounter: Payer: Self-pay | Admitting: Internal Medicine

## 2022-02-21 ENCOUNTER — Telehealth: Payer: Self-pay

## 2022-02-21 ENCOUNTER — Other Ambulatory Visit: Payer: Self-pay | Admitting: Physician Assistant

## 2022-02-21 DIAGNOSIS — D509 Iron deficiency anemia, unspecified: Secondary | ICD-10-CM

## 2022-02-21 NOTE — Telephone Encounter (Signed)
Called and left patient a voicemail confirming appointments for 02/24/2022 and to give the office a call if she has any questions or concerns.

## 2022-02-22 ENCOUNTER — Other Ambulatory Visit: Payer: Self-pay | Admitting: Physician Assistant

## 2022-02-22 DIAGNOSIS — D649 Anemia, unspecified: Secondary | ICD-10-CM

## 2022-02-22 NOTE — Progress Notes (Unsigned)
[facility] OFFICE PROGRESS NOTE  [name], MD [place] [address]  DIAGNOSIS:  Low-grade myelodysplastic syndrome with ring sideroblasts.  There was also an element of vitamin B12 deficiency after small bowel resection for an obstruction in December 2020.   PRIOR THERAPY: None  CURRENT THERAPY: 1) Vitamin B12 injection initially weekly then every 2 weeks and then monthly. 2) supportive care with transfusion on as-needed basis  3) Iron****????  INTERVAL HISTORY: Megan Hester 89 y.o. female returns to the clinic today for follow-up visit accompanied by ***.  The patient was last seen in clinic by Dr. Julien Nordmann on 12/5/thousand 23.  At that point in time the patient was having worsening fatigue, generalized weakness, low back pain, dizzy spells, dyspnea on exertion.  Her hemoglobin was significantly low at 5.8 and she was subsequently sent to the emergency room for further evaluation management.  In the emergency room she was found to be FO B T+.  Her Xarelto was held and she underwent EGD on 12/8/thousand 23 that showed 2 nonbleeding gastric ulcers with no stigmata of bleeding largest lesion measuring 4 mm.  There was also patchy moderate inflammation with congestion, erosions, erythema noted.  The patient was started on Protonix.  Her Xarelto was resumed at discharge.  Last B12 on 11/8. No iron.   Since being discharged she is feeling ***.  Fatigue and weakness?  Shortness of breath?  Taking iron supplements?  Still getting B12 injections?  Any more bleeding or bruising?  Lightheadedness and dizziness?  She is here today for evaluation repeat blood work.   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  MEDICATIONS:  Current Outpatient Medications  Medication Sig Dispense Refill   amiodarone (PACERONE) 200 MG tablet Take 200 mg by mouth daily with supper.     cetirizine (ZYRTEC) 10 MG tablet Take 10 mg by mouth every evening.     Cholecalciferol (VITAMIN D) 1000 UNITS capsule Take 1,000 Units by mouth daily with supper.     clotrimazole-betamethasone (LOTRISONE) cream Apply 1 application  topically 2 (two) times daily as needed (to affected areas).     Glucosamine HCl POWD Take 8.5 g by mouth See admin instructions. Mix 8.5 grams (0.5 scoopful) into juice and drink once a day, as tolerated     Hypromellose (ARTIFICIAL TEARS OP) Place 1 drop into the left eye 3 (three) times daily as needed (for dryness).     Melatonin 10 MG TABS Take 10 mg by mouth at bedtime.     metoprolol succinate (TOPROL XL) 25 MG 24 hr tablet Take 1 tablet (25 mg total) by mouth daily. (Patient taking differently: Take 25 mg by mouth in the morning.) 90 tablet 3   Multiple Vitamins-Minerals (PRESERVISION/LUTEIN PO) Take 1 capsule by mouth 2 (two) times daily.  pantoprazole (PROTONIX) 40 MG tablet Take 1 tablet (40 mg total) by mouth 2 (two) times daily. 60 tablet 3   Polyethyl Glycol-Propyl Glycol (SYSTANE OP) Place 1 drop into the right eye daily as needed (for dryness).     polyethylene glycol (MIRALAX / GLYCOLAX) packet Take 8.5 g by mouth See admin instructions. Mix 8.5 grams (0.5 scoopful) into suggested amount of water and drink once a day- HOLD FOR DIARRHEA     QUEtiapine (SEROQUEL) 25 MG tablet Take 25  mg by mouth at bedtime.     sertraline (ZOLOFT) 100 MG tablet Take 100 mg by mouth at bedtime.     XARELTO 15 MG TABS tablet TAKE ONE TABLET BY MOUTH ONCE DAILY WITH SUPPER (Patient taking differently: Take 15 mg by mouth daily after supper.) 30 tablet 5   No current facility-administered medications for this visit.    SURGICAL HISTORY:  Past Surgical History:  Procedure Laterality Date   APPLICATION OF WOUND VAC Bilateral 02/01/2019   Procedure: APPLICATION OF WOUND VAC;  Surgeon: [name], MD;  [facility]: [facility];  Service: General;  Laterality: Bilateral;   BIOPSY  01/17/2022   Procedure: BIOPSY;  Surgeon: Wilford Corner, MD;  [facility];  Service: Gastroenterology;;   BREAST BIOPSY Right    BREAST LUMPECTOMY Right 1999   RADIATION   CARDIAC CATHETERIZATION  02/05/1999   HYPERDYNAMIC LEFT VENTRICLE WITH EF OF 75-80%   CARDIOVERSION N/A 01/22/2015   Procedure: CARDIOVERSION;  Surgeon: [name], MD;  Location: [facility];  Service: Cardiovascular;  Laterality: N/A;   CARDIOVERSION N/A 02/22/2015   Procedure: CARDIOVERSION;  Surgeon: [name], MD;  Location: [facility];  Service: Cardiovascular;  Laterality: N/A;   DILATION AND CURETTAGE OF UTERUS     ENUCLEATION Right 2012   EP IMPLANTABLE DEVICE N/A 02/25/2016   Procedure: Pacemaker Implant;  Surgeon: [name], MD;  Location: [facility];  Service: Cardiovascular;  Laterality: N/A;   ESOPHAGOGASTRODUODENOSCOPY (EGD) WITH PROPOFOL N/A 01/17/2022   Procedure: ESOPHAGOGASTRODUODENOSCOPY (EGD) WITH PROPOFOL;  Surgeon: Wilford Corner, MD;  [facility];  Service: Gastroenterology;  Laterality: N/A;   INSERT / REPLACE / REMOVE PACEMAKER     LAPAROSCOPIC SMALL BOWEL RESECTION N/A 02/01/2019   Procedure: LAPAROSCOPIC SMALL BOWEL RESECTION WITH TAP BLOCK;  Surgeon: [name], MD;  [facility];  Service: General;  Laterality: N/A;   LAPAROSCOPY N/A 02/01/2019   Procedure:  LAPAROSCOPY DIAGNOSTIC;  Surgeon: [name], MD;  [facility];  Service: General;  Laterality: N/A;   LYSIS OF ADHESION N/A 02/01/2019   Procedure: LYSIS OF ADHESION;  Surgeon: [name], MD;  [facility];  Service: General;  Laterality: N/A;   TONSILLECTOMY      REVIEW OF SYSTEMS:   Review of Systems  Constitutional: Negative for appetite change, chills, fatigue, fever and unexpected weight change.  HENT:   Negative for mouth sores, nosebleeds, sore throat and trouble swallowing.   Eyes: Negative for eye problems and icterus.  Respiratory: Negative for cough, hemoptysis, shortness of breath and wheezing.   Cardiovascular: Negative for chest pain and leg swelling.  Gastrointestinal: Negative for abdominal pain, constipation, diarrhea, nausea and vomiting.  Genitourinary: Negative for bladder incontinence, difficulty urinating, dysuria, frequency and hematuria.   Musculoskeletal: Negative for back pain, gait problem, neck pain and neck stiffness.  Skin: Negative for itching and rash.  Neurological: Negative for dizziness, extremity weakness, gait problem, headaches, light-headedness and seizures.  Hematological: Negative for adenopathy. Does not  bruise/bleed easily.  Psychiatric/Behavioral: Negative for confusion, depression and sleep disturbance. The patient is not nervous/anxious.     PHYSICAL EXAMINATION:  There were no vitals taken for this visit.  ECOG PERFORMANCE STATUS: {CHL ONC ECOG [id number]  Physical Exam  Constitutional: Oriented to person, place, and time and well-developed, well-nourished, and in no distress. No distress.  HENT:  Head: Normocephalic and atraumatic.  Mouth/Throat: Oropharynx is clear and moist. No oropharyngeal exudate.  Eyes: Conjunctivae are normal. Right eye exhibits no discharge. Left eye exhibits no discharge. No scleral icterus.  Neck: Normal range of motion. Neck supple.  Cardiovascular: Normal rate, regular rhythm, normal  heart sounds and intact distal pulses.   Pulmonary/Chest: Effort normal and breath sounds normal. No respiratory distress. No wheezes. No rales.  Abdominal: Soft. Bowel sounds are normal. Exhibits no distension and no mass. There is no tenderness.  Musculoskeletal: Normal range of motion. Exhibits no edema.  Lymphadenopathy:    No cervical adenopathy.  Neurological: Alert and oriented to person, place, and time. Exhibits normal muscle tone. Gait normal. Coordination normal.  Skin: Skin is warm and dry. No rash noted. Not diaphoretic. No erythema. No pallor.  Psychiatric: Mood, memory and judgment normal.  Vitals reviewed.  LABORATORY DATA: Lab Results  Component Value Date   WBC 8.1 01/14/2022   HGB 9.6 (L) 01/18/2022   HCT 29.8 (L) 01/18/2022   MCV 115.7 (H) 01/14/2022   PLT 263 01/14/2022      Chemistry      Component Value Date/Time   NA 141 01/18/2022 0301   NA 141 12/04/2021 1414   K 3.3 (L) 01/18/2022 0301   CL 115 (H) 01/18/2022 0301   CO2 19 (L) 01/18/2022 0301   BUN 14 01/18/2022 0301   BUN 19 12/04/2021 1414   CREATININE 0.77 01/18/2022 0301   CREATININE 1.02 (H) 08/28/2020 1123      Component Value Date/Time   CALCIUM 8.2 (L) 01/18/2022 0301   ALKPHOS 115 01/14/2022 1546   AST 17 01/14/2022 1546   AST 13 (L) 08/28/2020 1123   ALT 14 01/14/2022 1546   ALT 7 08/28/2020 1123   BILITOT 0.9 01/14/2022 1546   BILITOT 0.5 12/04/2021 1414   BILITOT 0.8 08/28/2020 1123       RADIOGRAPHIC STUDIES:  No results found.   ASSESSMENT/PLAN:  This is a very pleasant 89 year old Caucasian female with recently diagnosed low-grade myelodysplastic syndrome in addition to questionable vitamin B12 deficiency.   The patient had a bone marrow biopsy and aspirate performed     The cytogenetics showed no 5 q. deletion. The patient is currently on observation with supportive transfusion on as-needed basis.   ***need treatment for MDS***??  The patient was seen with Dr.  Julien Nordmann today.  The patient had repeat labs that showed***   Dr. Julien Nordmann recommends***I will arrange for her.  Iron studies?  B12?  B12 injections monthly?  Iron supplements?  We will see her back for follow-up visit in* *.   The patient was advised to call immediately if she has any concerning symptoms in the interval. The patient voices understanding of current disease status and treatment options and is in agreement with the current care plan. All questions were answered. The patient knows to call the clinic with any problems, questions or concerns. We can certainly see the patient much sooner if necessary *       No orders of the defined types were placed in this encounter.    I  spent {CHL ONC TIME VISIT - TUUEK:[id number]} counseling the patient face to face. The total time spent in the appointment was {CHL ONC TIME VISIT - TAVWP:[id number]}.  Megan Hester L Obie Kallenbach, PA-C 02/22/22

## 2022-02-24 ENCOUNTER — Inpatient Hospital Stay (HOSPITAL_BASED_OUTPATIENT_CLINIC_OR_DEPARTMENT_OTHER): Payer: Medicare Other | Admitting: Physician Assistant

## 2022-02-24 ENCOUNTER — Inpatient Hospital Stay: Payer: Medicare Other

## 2022-02-24 ENCOUNTER — Ambulatory Visit (INDEPENDENT_AMBULATORY_CARE_PROVIDER_SITE_OTHER): Payer: Medicare Other

## 2022-02-24 ENCOUNTER — Other Ambulatory Visit: Payer: Self-pay

## 2022-02-24 ENCOUNTER — Other Ambulatory Visit: Payer: Self-pay | Admitting: Physician Assistant

## 2022-02-24 ENCOUNTER — Inpatient Hospital Stay: Payer: Medicare Other | Attending: Internal Medicine

## 2022-02-24 VITALS — BP 153/61 | HR 62 | Temp 98.2°F | Resp 14 | Wt 131.0 lb

## 2022-02-24 DIAGNOSIS — M549 Dorsalgia, unspecified: Secondary | ICD-10-CM

## 2022-02-24 DIAGNOSIS — I495 Sick sinus syndrome: Secondary | ICD-10-CM | POA: Diagnosis not present

## 2022-02-24 DIAGNOSIS — D461 Refractory anemia with ring sideroblasts: Secondary | ICD-10-CM | POA: Insufficient documentation

## 2022-02-24 DIAGNOSIS — D464 Refractory anemia, unspecified: Secondary | ICD-10-CM

## 2022-02-24 DIAGNOSIS — D539 Nutritional anemia, unspecified: Secondary | ICD-10-CM

## 2022-02-24 DIAGNOSIS — D649 Anemia, unspecified: Secondary | ICD-10-CM

## 2022-02-24 DIAGNOSIS — D509 Iron deficiency anemia, unspecified: Secondary | ICD-10-CM

## 2022-02-24 DIAGNOSIS — E538 Deficiency of other specified B group vitamins: Secondary | ICD-10-CM

## 2022-02-24 DIAGNOSIS — D62 Acute posthemorrhagic anemia: Secondary | ICD-10-CM

## 2022-02-24 LAB — CBC WITH DIFFERENTIAL (CANCER CENTER ONLY)
Abs Immature Granulocytes: 0.01 10*3/uL (ref 0.00–0.07)
Basophils Absolute: 0 10*3/uL (ref 0.0–0.1)
Basophils Relative: 1 %
Eosinophils Absolute: 0.4 10*3/uL (ref 0.0–0.5)
Eosinophils Relative: 8 %
HCT: 22 % — ABNORMAL LOW (ref 36.0–46.0)
Hemoglobin: 7.1 g/dL — ABNORMAL LOW (ref 12.0–15.0)
Immature Granulocytes: 0 %
Lymphocytes Relative: 22 %
Lymphs Abs: 1.2 10*3/uL (ref 0.7–4.0)
MCH: 31.3 pg (ref 26.0–34.0)
MCHC: 32.3 g/dL (ref 30.0–36.0)
MCV: 96.9 fL (ref 80.0–100.0)
Monocytes Absolute: 0.5 10*3/uL (ref 0.1–1.0)
Monocytes Relative: 9 %
Neutro Abs: 3.2 10*3/uL (ref 1.7–7.7)
Neutrophils Relative %: 60 %
Platelet Count: 242 10*3/uL (ref 150–400)
RBC: 2.27 MIL/uL — ABNORMAL LOW (ref 3.87–5.11)
RDW: 31.9 % — ABNORMAL HIGH (ref 11.5–15.5)
WBC Count: 5.2 10*3/uL (ref 4.0–10.5)
nRBC: 0.4 % — ABNORMAL HIGH (ref 0.0–0.2)

## 2022-02-24 LAB — IRON AND IRON BINDING CAPACITY (CC-WL,HP ONLY)
Iron: 107 ug/dL (ref 28–170)
Saturation Ratios: 71 % — ABNORMAL HIGH (ref 10.4–31.8)
TIBC: 151 ug/dL — ABNORMAL LOW (ref 250–450)
UIBC: 44 ug/dL — ABNORMAL LOW (ref 148–442)

## 2022-02-24 LAB — FERRITIN: Ferritin: 1362 ng/mL — ABNORMAL HIGH (ref 11–307)

## 2022-02-24 LAB — PREPARE RBC (CROSSMATCH)

## 2022-02-24 LAB — SAMPLE TO BLOOD BANK

## 2022-02-24 LAB — VITAMIN B12: Vitamin B-12: 370 pg/mL (ref 180–914)

## 2022-02-24 MED ORDER — ACETAMINOPHEN 325 MG PO TABS
650.0000 mg | ORAL_TABLET | Freq: Once | ORAL | Status: AC
  Administered 2022-02-24: 650 mg via ORAL
  Filled 2022-02-24: qty 2

## 2022-02-24 MED ORDER — TRAMADOL HCL 50 MG PO TABS
25.0000 mg | ORAL_TABLET | Freq: Once | ORAL | Status: AC
  Administered 2022-02-24: 25 mg via ORAL
  Filled 2022-02-24: qty 1

## 2022-02-24 MED ORDER — DIPHENHYDRAMINE HCL 25 MG PO CAPS
25.0000 mg | ORAL_CAPSULE | Freq: Once | ORAL | Status: AC
  Administered 2022-02-24: 25 mg via ORAL
  Filled 2022-02-24: qty 1

## 2022-02-24 MED ORDER — SODIUM CHLORIDE 0.9% IV SOLUTION
250.0000 mL | Freq: Once | INTRAVENOUS | Status: AC
  Administered 2022-02-24: 250 mL via INTRAVENOUS

## 2022-02-24 MED ORDER — CYANOCOBALAMIN 1000 MCG/ML IJ SOLN
1000.0000 ug | INTRAMUSCULAR | Status: AC
  Administered 2022-02-24: 1000 ug via INTRAMUSCULAR
  Filled 2022-02-24: qty 1

## 2022-02-24 MED ORDER — SODIUM CHLORIDE 0.9% FLUSH: 3.0000 mL | INTRAVENOUS | Status: DC | PRN

## 2022-02-24 NOTE — Patient Instructions (Signed)

## 2022-02-25 ENCOUNTER — Other Ambulatory Visit: Payer: Self-pay | Admitting: Nurse Practitioner

## 2022-02-25 LAB — TYPE AND SCREEN
ABO/RH(D): O NEG
Antibody Screen: NEGATIVE
Unit division: 0
Unit division: 0

## 2022-02-25 LAB — CUP PACEART REMOTE DEVICE CHECK
Battery Remaining Longevity: 42 mo
Battery Voltage: 2.96 V
Brady Statistic AP VP Percent: 1.64 %
Brady Statistic AP VS Percent: 92.87 %
Brady Statistic AS VP Percent: 0.46 %
Brady Statistic AS VS Percent: 5.03 %
Brady Statistic RA Percent Paced: 89.76 %
Brady Statistic RV Percent Paced: 1.76 %
Date Time Interrogation Session: 20240115230922
Implantable Lead Connection Status: 753985
Implantable Lead Connection Status: 753985
Implantable Lead Implant Date: 20180115
Implantable Lead Implant Date: 20180115
Implantable Lead Location: 753859
Implantable Lead Location: 753860
Implantable Lead Model: 5076
Implantable Lead Model: 5076
Implantable Pulse Generator Implant Date: 20180115
Lead Channel Impedance Value: 323 Ohm
Lead Channel Impedance Value: 361 Ohm
Lead Channel Impedance Value: 399 Ohm
Lead Channel Impedance Value: 437 Ohm
Lead Channel Pacing Threshold Amplitude: 0.75 V
Lead Channel Pacing Threshold Amplitude: 1 V
Lead Channel Pacing Threshold Pulse Width: 0.4 ms
Lead Channel Pacing Threshold Pulse Width: 0.4 ms
Lead Channel Sensing Intrinsic Amplitude: 0.375 mV
Lead Channel Sensing Intrinsic Amplitude: 0.375 mV
Lead Channel Sensing Intrinsic Amplitude: 11 mV
Lead Channel Sensing Intrinsic Amplitude: 11 mV
Lead Channel Setting Pacing Amplitude: 2 V
Lead Channel Setting Pacing Amplitude: 2.5 V
Lead Channel Setting Pacing Pulse Width: 0.4 ms
Lead Channel Setting Sensing Sensitivity: 2 mV
Zone Setting Status: 755011
Zone Setting Status: 755011

## 2022-02-25 LAB — BPAM RBC
Blood Product Expiration Date: 202402062359
Blood Product Expiration Date: 202402162359
ISSUE DATE / TIME: 202401151223
ISSUE DATE / TIME: 202401151223
Unit Type and Rh: 9500
Unit Type and Rh: 9500

## 2022-02-27 ENCOUNTER — Telehealth: Payer: Self-pay | Admitting: Internal Medicine

## 2022-02-27 NOTE — Telephone Encounter (Signed)
Scheduled per 01/15 los, spoke with patient's daughter in law. Patient will be notified of all upcoming appointments.

## 2022-03-05 ENCOUNTER — Other Ambulatory Visit: Payer: Self-pay

## 2022-03-05 ENCOUNTER — Telehealth: Payer: Self-pay

## 2022-03-05 ENCOUNTER — Inpatient Hospital Stay: Payer: Medicare Other

## 2022-03-05 DIAGNOSIS — D461 Refractory anemia with ring sideroblasts: Secondary | ICD-10-CM | POA: Diagnosis not present

## 2022-03-05 DIAGNOSIS — D464 Refractory anemia, unspecified: Secondary | ICD-10-CM

## 2022-03-05 LAB — CBC WITH DIFFERENTIAL (CANCER CENTER ONLY)
Abs Immature Granulocytes: 0.01 10*3/uL (ref 0.00–0.07)
Basophils Absolute: 0 10*3/uL (ref 0.0–0.1)
Basophils Relative: 1 %
Eosinophils Absolute: 0.4 10*3/uL (ref 0.0–0.5)
Eosinophils Relative: 7 %
HCT: 26 % — ABNORMAL LOW (ref 36.0–46.0)
Hemoglobin: 8.5 g/dL — ABNORMAL LOW (ref 12.0–15.0)
Immature Granulocytes: 0 %
Lymphocytes Relative: 31 %
Lymphs Abs: 1.7 10*3/uL (ref 0.7–4.0)
MCH: 31.6 pg (ref 26.0–34.0)
MCHC: 32.7 g/dL (ref 30.0–36.0)
MCV: 96.7 fL (ref 80.0–100.0)
Monocytes Absolute: 0.5 10*3/uL (ref 0.1–1.0)
Monocytes Relative: 9 %
Neutro Abs: 2.9 10*3/uL (ref 1.7–7.7)
Neutrophils Relative %: 52 %
Platelet Count: 204 10*3/uL (ref 150–400)
RBC: 2.69 MIL/uL — ABNORMAL LOW (ref 3.87–5.11)
RDW: 26.4 % — ABNORMAL HIGH (ref 11.5–15.5)
WBC Count: 5.5 10*3/uL (ref 4.0–10.5)
nRBC: 0.4 % — ABNORMAL HIGH (ref 0.0–0.2)

## 2022-03-05 LAB — SAMPLE TO BLOOD BANK

## 2022-03-05 NOTE — Telephone Encounter (Signed)
Called patient spoke with Daughter in law gave her the results from the hgb 8.5 as per Cassie PA and let her know she will not need a blood transfusion.

## 2022-03-19 ENCOUNTER — Telehealth: Payer: Self-pay

## 2022-03-19 ENCOUNTER — Inpatient Hospital Stay: Payer: Medicare Other | Attending: Internal Medicine

## 2022-03-19 ENCOUNTER — Other Ambulatory Visit: Payer: Self-pay

## 2022-03-19 DIAGNOSIS — D464 Refractory anemia, unspecified: Secondary | ICD-10-CM

## 2022-03-19 DIAGNOSIS — D461 Refractory anemia with ring sideroblasts: Secondary | ICD-10-CM | POA: Insufficient documentation

## 2022-03-19 DIAGNOSIS — E538 Deficiency of other specified B group vitamins: Secondary | ICD-10-CM | POA: Diagnosis not present

## 2022-03-19 LAB — CBC WITH DIFFERENTIAL (CANCER CENTER ONLY)
Abs Immature Granulocytes: 0.02 10*3/uL (ref 0.00–0.07)
Basophils Absolute: 0 10*3/uL (ref 0.0–0.1)
Basophils Relative: 0 %
Eosinophils Absolute: 0.3 10*3/uL (ref 0.0–0.5)
Eosinophils Relative: 4 %
HCT: 24.1 % — ABNORMAL LOW (ref 36.0–46.0)
Hemoglobin: 8.1 g/dL — ABNORMAL LOW (ref 12.0–15.0)
Immature Granulocytes: 0 %
Lymphocytes Relative: 32 %
Lymphs Abs: 2.3 10*3/uL (ref 0.7–4.0)
MCH: 33.1 pg (ref 26.0–34.0)
MCHC: 33.6 g/dL (ref 30.0–36.0)
MCV: 98.4 fL (ref 80.0–100.0)
Monocytes Absolute: 0.8 10*3/uL (ref 0.1–1.0)
Monocytes Relative: 11 %
Neutro Abs: 3.7 10*3/uL (ref 1.7–7.7)
Neutrophils Relative %: 53 %
Platelet Count: 322 10*3/uL (ref 150–400)
RBC: 2.45 MIL/uL — ABNORMAL LOW (ref 3.87–5.11)
RDW: 27.5 % — ABNORMAL HIGH (ref 11.5–15.5)
WBC Count: 7.1 10*3/uL (ref 4.0–10.5)
nRBC: 0.4 % — ABNORMAL HIGH (ref 0.0–0.2)

## 2022-03-19 LAB — SAMPLE TO BLOOD BANK

## 2022-03-19 NOTE — Telephone Encounter (Signed)
CRITICAL VALUE STICKER  CRITICAL VALUE: HGB 8.1  RECEIVER (on-site recipient of call): Vegas Fritze P. LPN  DATE & TIME NOTIFIED: 03/19/22 1:57 PM  MESSENGER (representative from lab): LAUREN  MD NOTIFIED: C. [name], PA-C  TIME OF NOTIFICATION: 2:00 PM

## 2022-03-27 ENCOUNTER — Ambulatory Visit: Payer: Medicare Other

## 2022-03-27 ENCOUNTER — Other Ambulatory Visit: Payer: Self-pay | Admitting: Cardiology

## 2022-03-27 ENCOUNTER — Ambulatory Visit: Payer: Medicare Other | Admitting: Internal Medicine

## 2022-03-27 ENCOUNTER — Other Ambulatory Visit: Payer: Medicare Other

## 2022-03-28 ENCOUNTER — Other Ambulatory Visit (HOSPITAL_COMMUNITY): Payer: Self-pay | Admitting: Orthopedic Surgery

## 2022-03-28 DIAGNOSIS — M545 Low back pain, unspecified: Secondary | ICD-10-CM

## 2022-03-28 DIAGNOSIS — M5416 Radiculopathy, lumbar region: Secondary | ICD-10-CM

## 2022-03-28 NOTE — Telephone Encounter (Signed)
Prescription refill request for Xarelto received.  Indication: afib  Last office visit: Megan Hester 12/04/2021 Weight: 59.4 kg  Age: 89 yo  Scr: 0.77, 01/18/2022 CrCl: 78m/min   Refill sent.

## 2022-04-02 ENCOUNTER — Other Ambulatory Visit: Payer: Self-pay | Admitting: Physician Assistant

## 2022-04-02 ENCOUNTER — Inpatient Hospital Stay: Payer: Medicare Other

## 2022-04-02 ENCOUNTER — Encounter: Payer: Self-pay | Admitting: Nurse Practitioner

## 2022-04-02 ENCOUNTER — Inpatient Hospital Stay (HOSPITAL_BASED_OUTPATIENT_CLINIC_OR_DEPARTMENT_OTHER): Payer: Medicare Other | Admitting: Nurse Practitioner

## 2022-04-02 ENCOUNTER — Inpatient Hospital Stay (HOSPITAL_BASED_OUTPATIENT_CLINIC_OR_DEPARTMENT_OTHER): Payer: Medicare Other | Admitting: Internal Medicine

## 2022-04-02 ENCOUNTER — Other Ambulatory Visit: Payer: Medicare Other

## 2022-04-02 ENCOUNTER — Telehealth: Payer: Self-pay

## 2022-04-02 ENCOUNTER — Other Ambulatory Visit: Payer: Self-pay

## 2022-04-02 VITALS — BP 132/62 | HR 61 | Temp 98.2°F | Resp 15 | Wt 126.6 lb

## 2022-04-02 DIAGNOSIS — D469 Myelodysplastic syndrome, unspecified: Secondary | ICD-10-CM

## 2022-04-02 DIAGNOSIS — D464 Refractory anemia, unspecified: Secondary | ICD-10-CM

## 2022-04-02 DIAGNOSIS — D62 Acute posthemorrhagic anemia: Secondary | ICD-10-CM

## 2022-04-02 DIAGNOSIS — Z515 Encounter for palliative care: Secondary | ICD-10-CM

## 2022-04-02 DIAGNOSIS — R634 Abnormal weight loss: Secondary | ICD-10-CM

## 2022-04-02 DIAGNOSIS — R530 Neoplastic (malignant) related fatigue: Secondary | ICD-10-CM

## 2022-04-02 DIAGNOSIS — R630 Anorexia: Secondary | ICD-10-CM | POA: Diagnosis not present

## 2022-04-02 DIAGNOSIS — Z7189 Other specified counseling: Secondary | ICD-10-CM

## 2022-04-02 DIAGNOSIS — D461 Refractory anemia with ring sideroblasts: Secondary | ICD-10-CM | POA: Diagnosis not present

## 2022-04-02 DIAGNOSIS — D539 Nutritional anemia, unspecified: Secondary | ICD-10-CM

## 2022-04-02 LAB — CBC WITH DIFFERENTIAL (CANCER CENTER ONLY)
Abs Immature Granulocytes: 0.02 10*3/uL (ref 0.00–0.07)
Basophils Absolute: 0 10*3/uL (ref 0.0–0.1)
Basophils Relative: 1 %
Eosinophils Absolute: 0.1 10*3/uL (ref 0.0–0.5)
Eosinophils Relative: 2 %
HCT: 20.6 % — ABNORMAL LOW (ref 36.0–46.0)
Hemoglobin: 6.8 g/dL — CL (ref 12.0–15.0)
Immature Granulocytes: 0 %
Lymphocytes Relative: 23 %
Lymphs Abs: 1.2 10*3/uL (ref 0.7–4.0)
MCH: 34.3 pg — ABNORMAL HIGH (ref 26.0–34.0)
MCHC: 33 g/dL (ref 30.0–36.0)
MCV: 104 fL — ABNORMAL HIGH (ref 80.0–100.0)
Monocytes Absolute: 0.5 10*3/uL (ref 0.1–1.0)
Monocytes Relative: 9 %
Neutro Abs: 3.4 10*3/uL (ref 1.7–7.7)
Neutrophils Relative %: 65 %
Platelet Count: 228 10*3/uL (ref 150–400)
RBC: 1.98 MIL/uL — ABNORMAL LOW (ref 3.87–5.11)
RDW: 28.2 % — ABNORMAL HIGH (ref 11.5–15.5)
WBC Count: 5.3 10*3/uL (ref 4.0–10.5)
nRBC: 0.6 % — ABNORMAL HIGH (ref 0.0–0.2)

## 2022-04-02 LAB — PREPARE RBC (CROSSMATCH)

## 2022-04-02 LAB — SAMPLE TO BLOOD BANK

## 2022-04-02 NOTE — Progress Notes (Signed)
[facility] [phone]   [phone]  OFFICE PROGRESS NOTE  [name], MD [name] [address]  DIAGNOSIS:  Low-grade myelodysplastic syndrome with ring sideroblasts.  There was also an element of vitamin B12 deficiency after small bowel resection for an obstruction in December 2020.  PRIOR THERAPY:None  CURRENT THERAPY:  1) Vitamin B12 injection initially weekly then every 2 weeks and then monthly. 2) supportive care with transfusion on as-needed basis  INTERVAL HISTORY: Megan Hester 89 y.o. female returns to the clinic today for follow-up visit accompanied by her daughter-in-law.  The patient continues to complain of increasing fatigue and weakness.  She has no bleeding issues.  She has no current nausea, vomiting, diarrhea or constipation.  She has no headache or visual changes.  She has no chest pain but has shortness of breath with exertion.  She continue on supportive care with frequent PRBCs transfusion.  She is here today for evaluation with repeat blood work.   MEDICAL HISTORY: Past Medical History:  Diagnosis Date   Arthritis    "all over my body" (02/25/2016)   Cancer of right breast ([place]) 1999   DCIS   GERD (gastroesophageal reflux disease)    hx; "when I was working"   Hypertension    Macular degeneration    Paroxysmal atrial fibrillation ([place])    Presence of permanent cardiac pacemaker    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    ALLERGIES:  is allergic to shellfish allergy, atropine, codeine, doxycycline, erythromycin, fenoprofen calcium, iodinated contrast media, isopto hyoscine [scopolamine], naproxen, other, oxycodone, penicillins, sulfa drugs cross reactors, warfarin, ceclor [cefaclor], levaquin [levofloxacin], and tape.  MEDICATIONS:  Current Outpatient Medications  Medication Sig Dispense Refill   amiodarone (PACERONE) 200 MG tablet Take 200 mg by mouth daily with  supper.     cetirizine (ZYRTEC) 10 MG tablet Take 10 mg by mouth every evening.     Cholecalciferol (VITAMIN D) 1000 UNITS capsule Take 1,000 Units by mouth daily with supper.     clotrimazole-betamethasone (LOTRISONE) cream Apply 1 application  topically 2 (two) times daily as needed (to affected areas).     Glucosamine HCl POWD Take 8.5 g by mouth See admin instructions. Mix 8.5 grams (0.5 scoopful) into juice and drink once a day, as tolerated     Hypromellose (ARTIFICIAL TEARS OP) Place 1 drop into the left eye 3 (three) times daily as needed (for dryness).     Melatonin 10 MG TABS Take 10 mg by mouth at bedtime.     metoprolol succinate (TOPROL XL) 25 MG 24 hr tablet Take 1 tablet (25 mg total) by mouth daily. (Patient taking differently: Take 25 mg by mouth in the morning.) 90 tablet 3   Multiple Vitamins-Minerals (PRESERVISION/LUTEIN PO) Take 1 capsule by mouth 2 (two) times daily.      pantoprazole (PROTONIX) 40 MG tablet Take 1 tablet (40 mg total) by mouth 2 (two) times daily. 60 tablet 3   Polyethyl Glycol-Propyl Glycol (SYSTANE OP) Place 1 drop into the right eye daily as needed (for dryness).     polyethylene glycol (MIRALAX / GLYCOLAX) packet Take 8.5 g by mouth See admin instructions. Mix 8.5 grams (0.5 scoopful) into suggested amount of water and drink once a day- HOLD FOR DIARRHEA     QUEtiapine (SEROQUEL) 25 MG tablet Take 25 mg by mouth at bedtime.     sertraline (ZOLOFT) 100 MG tablet  Take 100 mg by mouth at bedtime.     XARELTO 15 MG TABS tablet TAKE ONE TABLET BY MOUTH ONCE DAILY WITH SUPPER 30 tablet 5   No current facility-administered medications for this visit.   Facility-Administered Medications Ordered in Other Visits  Medication Dose Route Frequency Provider Last Rate Last Admin   cyanocobalamin (VITAMIN B12) injection 1,000 mcg  1,000 mcg Intramuscular Q30 days Curt Bears, MD   1,000 mcg at 02/24/22 [id number]    SURGICAL HISTORY:  Past Surgical History:   Procedure Laterality Date   APPLICATION OF WOUND VAC Bilateral 02/01/2019   Procedure: APPLICATION OF WOUND VAC;  Surgeon: [name], MD;  [facility];  Service: General;  Laterality: Bilateral;   BIOPSY  01/17/2022   Procedure: BIOPSY;  Surgeon: Wilford Corner, MD;  [facility];  Service: Gastroenterology;;   BREAST BIOPSY Right    BREAST LUMPECTOMY Right 1999   RADIATION   CARDIAC CATHETERIZATION  02/05/1999   HYPERDYNAMIC LEFT VENTRICLE WITH EF OF 75-80%   CARDIOVERSION N/A 01/22/2015   Procedure: CARDIOVERSION;  Surgeon: [name], MD;  Location: [facility];  Service: Cardiovascular;  Laterality: N/A;   CARDIOVERSION N/A 02/22/2015   Procedure: CARDIOVERSION;  Surgeon: [name], MD;  Location: [facility];  Service: Cardiovascular;  Laterality: N/A;   DILATION AND CURETTAGE OF UTERUS     ENUCLEATION Right 2012   EP IMPLANTABLE DEVICE N/A 02/25/2016   Procedure: Pacemaker Implant;  Surgeon: [name], MD;  Location: [facility];  Service: Cardiovascular;  Laterality: N/A;   ESOPHAGOGASTRODUODENOSCOPY (EGD) WITH PROPOFOL N/A 01/17/2022   Procedure: ESOPHAGOGASTRODUODENOSCOPY (EGD) WITH PROPOFOL;  Surgeon: Wilford Corner, MD;  [facility];  Service: Gastroenterology;  Laterality: N/A;   INSERT / REPLACE / REMOVE PACEMAKER     LAPAROSCOPIC SMALL BOWEL RESECTION N/A 02/01/2019   Procedure: LAPAROSCOPIC SMALL BOWEL RESECTION WITH TAP BLOCK;  Surgeon: [name], MD;  [facility];  Service: General;  Laterality: N/A;   LAPAROSCOPY N/A 02/01/2019   Procedure: LAPAROSCOPY DIAGNOSTIC;  Surgeon: [name], MD;  [facility];  Service: General;  Laterality: N/A;   LYSIS OF ADHESION N/A 02/01/2019   Procedure: LYSIS OF ADHESION;  Surgeon: [name], MD;  [facility];  Service: General;  Laterality: N/A;   TONSILLECTOMY      REVIEW OF SYSTEMS:  A comprehensive review of systems was negative except for:  Constitutional: positive for fatigue Respiratory: positive for dyspnea on exertion Musculoskeletal: positive for back pain Neurological: positive for dizziness and weakness   PHYSICAL EXAMINATION: General appearance: alert, cooperative, fatigued, and no distress Head: Normocephalic, without obvious abnormality, atraumatic Neck: no adenopathy, no JVD, supple, symmetrical, trachea midline, and thyroid not enlarged, symmetric, no tenderness/mass/nodules Lymph nodes: Cervical, supraclavicular, and axillary nodes normal. Resp: clear to auscultation bilaterally Back: symmetric, no curvature. ROM normal. No CVA tenderness. Cardio: regular rate and rhythm, S1, S2 normal, no murmur, click, rub or gallop GI: soft, non-tender; bowel sounds normal; no masses,  no organomegaly Extremities: extremities normal, atraumatic, no cyanosis or edema  ECOG PERFORMANCE STATUS: 1 - Symptomatic but completely ambulatory  Blood pressure 132/62, pulse 61, temperature 98.2 F (36.8 C), temperature source Oral, resp. rate 15, weight 126 lb 9.6 oz (57.4 kg), SpO2 98 %.  LABORATORY DATA: Lab Results  Component Value Date   WBC 5.3 04/02/2022   HGB 6.8 (LL) 04/02/2022   HCT 20.6 (L) 04/02/2022   MCV 104.0 (H) 04/02/2022   PLT 228 04/02/2022  Chemistry      Component Value Date/Time   NA 141 01/18/2022 0301   NA 141 12/04/2021 1414   K 3.3 (L) 01/18/2022 0301   CL 115 (H) 01/18/2022 0301   CO2 19 (L) 01/18/2022 0301   BUN 14 01/18/2022 0301   BUN 19 12/04/2021 1414   CREATININE 0.77 01/18/2022 0301   CREATININE 1.02 (H) 08/28/2020 1123      Component Value Date/Time   CALCIUM 8.2 (L) 01/18/2022 0301   ALKPHOS 115 01/14/2022 1546   AST 17 01/14/2022 1546   AST 13 (L) 08/28/2020 1123   ALT 14 01/14/2022 1546   ALT 7 08/28/2020 1123   BILITOT 0.9 01/14/2022 1546   BILITOT 0.5 12/04/2021 1414   BILITOT 0.8 08/28/2020 1123       RADIOGRAPHIC STUDIES: No results found.  ASSESSMENT AND PLAN:  This is a very pleasant 89 years old white female with recently diagnosed low-grade myelodysplastic syndrome in addition to questionable vitamin B12 deficiency.   The patient had a bone marrow biopsy and aspirate performed recently.   The cytogenetics showed no 5 q. deletion. The patient is currently on observation and frequent PRBCs transfusion at regular basis. Repeat CBC today showed hemoglobin was down to 6.8. I recommended for the patient to receive 2 units of PRBCs transfusion and this will be scheduled to be done tomorrow. I will see her back for follow-up visit in 3 weeks for evaluation and repeat blood work. I discussed with the patient and her daughter-in-law briefly the option of palliative care and hospice but they have not sought about this option yet. She was advised to call immediately if she has any other concerning symptoms in the interval. The patient voices understanding of current disease status and treatment options and is in agreement with the current care plan.  All questions were answered. The patient knows to call the clinic with any problems, questions or concerns. We can certainly see the patient much sooner if necessary.   Disclaimer: This note was dictated with voice recognition software. Similar sounding words can inadvertently be transcribed and may not be corrected upon review.

## 2022-04-02 NOTE — Telephone Encounter (Signed)
CRITICAL VALUE STICKER  CRITICAL VALUE: HGB 6.8  RECEIVER (on-site recipient of call): Laken Rog P. LPN   DATE & TIME NOTIFIED: 04/02/22 1:42 pm  MESSENGER (representative from lab): Deidre Ala  MD NOTIFIED: Dr. Julien Nordmann  TIME OF NOTIFICATION: 1:44 pm  RESPONSE:  Blood Transfusion 2 Units

## 2022-04-02 NOTE — Progress Notes (Signed)
[place]  [phone] [phone]   Name: Megan Hester Date: 04/02/2022 MRN: [id number]  DOB: February 20, 1930  Patient Care Team: [name], MD as PCP - General (Internal Medicine) Donato Heinz, MD as PCP - Cardiology (Cardiology) [name], MD (Inactive) as PCP - Electrophysiology (Cardiology) [name], MD (Inactive) as Consulting Physician (Cardiology) Fontaine, Belinda Block, MD (Inactive) as Consulting Physician (Gynecology) Gerarda Fraction, MD as Referring Physician (Ophthalmology) [name], MD as Consulting Physician (General Surgery)    REASON FOR CONSULTATION: Megan Hester is a 89 y.o. female with oncologic medical history including Low-grade myelodysplastic syndrome (06/2021), A-fib, and iron deficiency anemia.  Palliative ask to see for symptom management and goals of care.    SOCIAL HISTORY:     reports that she has never smoked. She has never used smokeless tobacco. She reports current alcohol use. She reports that she does not use drugs.  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    PAST SURGICAL HISTORY:  Past Surgical History:  Procedure Laterality Date   APPLICATION OF WOUND VAC Bilateral 02/01/2019   Procedure: APPLICATION OF WOUND VAC;  Surgeon: [name], MD;  Location: [facility];  Service: General;  Laterality: Bilateral;   BIOPSY  01/17/2022   Procedure: BIOPSY;  Surgeon: Wilford Corner, MD;  Location: [facility];  Service:  Gastroenterology;;   BREAST BIOPSY Right    BREAST LUMPECTOMY Right 1999   RADIATION   CARDIAC CATHETERIZATION  02/05/1999   HYPERDYNAMIC LEFT VENTRICLE WITH EF OF 75-80%   CARDIOVERSION N/A 01/22/2015   Procedure: CARDIOVERSION;  Surgeon: [name], MD;  Location: [facility];  Service: Cardiovascular;  Laterality: N/A;   CARDIOVERSION N/A 02/22/2015   Procedure: CARDIOVERSION;  Surgeon: [name], MD;  Location: [facility];  Service: Cardiovascular;  Laterality: N/A;   DILATION AND CURETTAGE OF UTERUS     ENUCLEATION Right 2012   EP IMPLANTABLE DEVICE N/A 02/25/2016   Procedure: Pacemaker Implant;  Surgeon: [name], MD;  Location: [facility];  Service: Cardiovascular;  Laterality: N/A;   ESOPHAGOGASTRODUODENOSCOPY (EGD) WITH PROPOFOL N/A 01/17/2022   Procedure: ESOPHAGOGASTRODUODENOSCOPY (EGD) WITH PROPOFOL;  Surgeon: Wilford Corner, MD;  [facility];  Service: Gastroenterology;  Laterality: N/A;   INSERT / REPLACE / REMOVE PACEMAKER     LAPAROSCOPIC SMALL BOWEL RESECTION N/A 02/01/2019   Procedure: LAPAROSCOPIC SMALL BOWEL RESECTION WITH TAP BLOCK;  Surgeon: [name], MD;  [facility];  Service: General;  Laterality: N/A;   LAPAROSCOPY N/A 02/01/2019   Procedure: LAPAROSCOPY DIAGNOSTIC;  Surgeon: [name], MD;  [facility];  Service: General;  Laterality: N/A;   LYSIS OF ADHESION N/A 02/01/2019   Procedure: LYSIS OF ADHESION;  Surgeon: [name], MD;  [facility];  Service: General;  Laterality: N/A;   TONSILLECTOMY      HEMATOLOGY/ONCOLOGY HISTORY:  Oncology History   No history exists.    ALLERGIES:  is allergic to  shellfish allergy, atropine, codeine, doxycycline, erythromycin, fenoprofen calcium, iodinated contrast media, isopto hyoscine [scopolamine], naproxen, other, oxycodone, penicillins, sulfa drugs cross reactors, warfarin, ceclor [cefaclor], levaquin [levofloxacin], and tape.  MEDICATIONS:  Current  Outpatient Medications  Medication Sig Dispense Refill   amiodarone (PACERONE) 200 MG tablet Take 200 mg by mouth daily with supper.     cetirizine (ZYRTEC) 10 MG tablet Take 10 mg by mouth every evening.     Cholecalciferol (VITAMIN D) 1000 UNITS capsule Take 1,000 Units by mouth daily with supper.     clotrimazole-betamethasone (LOTRISONE) cream Apply 1 application  topically 2 (two) times daily as needed (to affected areas).     Glucosamine HCl POWD Take 8.5 g by mouth See admin instructions. Mix 8.5 grams (0.5 scoopful) into juice and drink once a day, as tolerated     Hypromellose (ARTIFICIAL TEARS OP) Place 1 drop into the left eye 3 (three) times daily as needed (for dryness).     Melatonin 10 MG TABS Take 10 mg by mouth at bedtime.     metoprolol succinate (TOPROL XL) 25 MG 24 hr tablet Take 1 tablet (25 mg total) by mouth daily. (Patient taking differently: Take 25 mg by mouth in the morning.) 90 tablet 3   Multiple Vitamins-Minerals (PRESERVISION/LUTEIN PO) Take 1 capsule by mouth 2 (two) times daily.      pantoprazole (PROTONIX) 40 MG tablet Take 1 tablet (40 mg total) by mouth 2 (two) times daily. 60 tablet 3   Polyethyl Glycol-Propyl Glycol (SYSTANE OP) Place 1 drop into the right eye daily as needed (for dryness).     polyethylene glycol (MIRALAX / GLYCOLAX) packet Take 8.5 g by mouth See admin instructions. Mix 8.5 grams (0.5 scoopful) into suggested amount of water and drink once a day- HOLD FOR DIARRHEA     QUEtiapine (SEROQUEL) 25 MG tablet Take 25 mg by mouth at bedtime.     sertraline (ZOLOFT) 100 MG tablet Take 100 mg by mouth at bedtime.     XARELTO 15 MG TABS tablet TAKE ONE TABLET BY MOUTH ONCE DAILY WITH SUPPER 30 tablet 5   No current facility-administered medications for this visit.   Facility-Administered Medications Ordered in Other Visits  Medication Dose Route Frequency Provider Last Rate Last Admin   cyanocobalamin (VITAMIN B12) injection 1,000 mcg  1,000 mcg  Intramuscular Q30 days Curt Bears, MD   1,000 mcg at 02/24/22 [id number]    VITAL SIGNS: T 98.2, HR 61, R 15, BP 132/62 98% on room air Estimated body mass index is 22.49 kg/m as calculated from the following:   Height as of 01/14/22: '5\' 4"'$  (1.626 m).   Weight as of 02/24/22: 131 lb (59.4 kg).  LABS: CBC:    Component Value Date/Time   WBC 7.1 03/19/2022 1342   WBC 5.8 09/02/2021 1716   HGB 8.1 (L) 03/19/2022 1342   HGB 8.5 (L) 12/04/2021 1414   HCT 24.1 (L) 03/19/2022 1342   HCT 25.2 (L) 12/04/2021 1414   PLT 322 03/19/2022 1342   PLT 217 12/04/2021 1414   MCV 98.4 03/19/2022 1342   MCV 102 (H) 12/04/2021 1414   NEUTROABS 3.7 03/19/2022 1342   NEUTROABS 2.1 01/10/2021 1152   LYMPHSABS 2.3 03/19/2022 1342   LYMPHSABS 1.4 01/10/2021 1152   MONOABS 0.8 03/19/2022 1342   EOSABS 0.3 03/19/2022 1342   EOSABS 0.2 01/10/2021 1152   BASOSABS 0.0 03/19/2022 1342   BASOSABS 0.0 01/10/2021 1152   Comprehensive Metabolic Panel:    Component  Value Date/Time   NA 141 01/18/2022 0301   NA 141 12/04/2021 1414   K 3.3 (L) 01/18/2022 0301   CL 115 (H) 01/18/2022 0301   CO2 19 (L) 01/18/2022 0301   BUN 14 01/18/2022 0301   BUN 19 12/04/2021 1414   CREATININE 0.77 01/18/2022 0301   CREATININE 1.02 (H) 08/28/2020 1123   GLUCOSE 97 01/18/2022 0301   CALCIUM 8.2 (L) 01/18/2022 0301   AST 17 01/14/2022 1546   AST 13 (L) 08/28/2020 1123   ALT 14 01/14/2022 1546   ALT 7 08/28/2020 1123   ALKPHOS 115 01/14/2022 1546   BILITOT 0.9 01/14/2022 1546   BILITOT 0.5 12/04/2021 1414   BILITOT 0.8 08/28/2020 1123   PROT 6.5 01/14/2022 1546   PROT 6.5 12/04/2021 1414   ALBUMIN 3.5 01/14/2022 1546   ALBUMIN 4.1 12/04/2021 1414    RADIOGRAPHIC STUDIES: No results found.  PERFORMANCE STATUS (ECOG) : 2 - Symptomatic, <50% confined to bed  Review of Systems  Constitutional:  Positive for appetite change, fatigue and unexpected weight change.  HENT:  Positive for hearing loss.   Eyes:         Right eye enucleation.  Musculoskeletal:  Positive for back pain.  Neurological:  Positive for weakness.   Unless otherwise noted, a complete review of systems is negative.  Physical Exam Constitutional:      Appearance: She is underweight.     Comments: Patient observed shifting weight from side to side trying to get comfortable in chair. TLSO brace in place.  Eyes:     Comments: Right eye enucleation.  Cardiovascular:     Rate and Rhythm: Normal rate and regular rhythm.  Pulmonary:     Effort: Pulmonary effort is normal.     Breath sounds: Normal breath sounds.  Abdominal:     Palpations: Abdomen is soft.  Musculoskeletal:     Right lower leg: 1+ Pitting Edema present.     Left lower leg: 1+ Pitting Edema present.  Psychiatric:        Mood and Affect: Mood normal.    IMPRESSION: Present for visit are patient and her daughter-in-law Seth Bake. Patient has 3 children, 2 sons that live locally in [place] and a 1 daughter that lives at [place]. Patient widowed for ~3 years and her son Remo Lipps and daughter-in-law Seth Bake live with her. They have pets in the home including: 2 dogs, 1 cat, and 5 fish. Ms. Afzal is alert and oriented to all spheres. She states that she depends on Seth Bake to help answer questions. Ms. 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  Back pain: Patient is currently seeing Orthopedics for the management of her back pain. Patient reports that imaging revealed an L3 compression fracture and she has suffered a great deal of pain since that time. She has a TLSO brace in place during this visit; reports that she wears during the day and takes off at night. Patient has underwent PT with minimal improvement.   Per patient and family, patient was unable to tolerate Gabapentin because it made her confused. Family currently administers Tylenol, Tramadol, and Robaxin - rotating them throughout the day. Each med is  given 2x/day this rotation. Patient reports that some days are good and others are very difficult. Reports that today is a difficult day and is observed grimacing and shifting her weight in the wheelchair. She is able to sleep with use of pillows.  Patient noted to  have multiple allergies. Medication list includes allergy to Naproxen; patient unable to remember effect/reaction. Would consider Toradol but in the absence of this information and with history of retinal bleed - not felt to be appropriate at this time.  Recommend continued management of patient's pain medications by Orthopedics.  Poor appetite: Patient states that she is having difficulty eating and cites taste as well as "things getting stuck" as reasons for decreased intake. Family reports that patient has lost ~20 pounds since Thanksgiving. Education provided on dietary modifications to help with appetite and swallowing. Encouraged to use supplement drinks for protein, as swallowing meats is most difficult for patient.   Educated regarding the function of Speech Therapy for evaluation and support. Patient declines at this time but will think about it.  Constipation: Reports that bowel pattern varies from day to day. Sometimes patient has constipation and at other times diarrhea. She takes Metamucil gummies and titrates these for desire effect.   Goals of Care: Patient confirms that her son Richardson Landry would speak for her in the event she is unable to speak for herself. States that she has HCPOA and Living Will ACP documents. She is encouraged to review these documents to make sure they reflect current wishes. Copies requested.  I introduced myself, Nikki NP, Maygan RN, and Palliative's role in collaboration with the hematology team. Concept of Palliative Care was introduced as specialized medical care for people and their families living with serious illness.  It focuses on providing relief from the symptoms and stress of a serious illness.   The goal is to improve quality of life for both the patient and the family. Values and goals of care important to patient and family were attempted to be elicited.   We discussed Ms. Henning's current illness and what it means in the larger context of Her on-going co-morbidities. Natural disease trajectory and expectations were discussed. I discussed the importance of continued conversation with family and their medical providers regarding overall plan of care and treatment options, ensuring decisions are within the context of the patients values and GOCs.  PLAN: Established therapeutic relationship. Education provided on palliative's role in collaboration with their Hematology team. Recommend continued management of back pain by Orthopedics. Education provided regarding nutritional supplementation.  Recommend Speech Therapy for any ongoing difficulty swallowing. Continue Metamucil as per patient/family preference for constipation. Continue goals of care conversations. Palliative available to assist and support these conversations.   Patient expressed understanding and was in agreement with this plan. She also understands that She can call the clinic at any time with any questions, concerns, or complaints.   Thank you for your referral and allowing Palliative to assist in Mrs. Mariana Arn Bergum's care.    Number and complexity of problems addressed: HIGH - 1 or more chronic illnesses with SEVERE exacerbation, progression, or side effects of treatment - advanced cancer, pain. Any controlled substances utilized were prescribed in the context of palliative care.  Time Total: 55  I assessed patient with Levada Dy, NP Student. Agree with above findings.   Visit consisted of counseling and education dealing with the complex and emotionally intense issues of symptom management and palliative care in the setting of serious and potentially life-threatening illness.Greater than 50%  of this time was  spent counseling and coordinating care related to the above assessment and plan.   Signed by: Moss Mc, RN MSN [facility] / NP Student  Signed by: Alda Lea, AGPCNP-BC Palliative Medicine Team/[place]

## 2022-04-03 ENCOUNTER — Telehealth: Payer: Self-pay | Admitting: Internal Medicine

## 2022-04-03 ENCOUNTER — Inpatient Hospital Stay: Payer: Medicare Other

## 2022-04-03 VITALS — BP 155/74 | HR 60 | Temp 97.7°F | Resp 16 | Wt 126.6 lb

## 2022-04-03 DIAGNOSIS — M549 Dorsalgia, unspecified: Secondary | ICD-10-CM

## 2022-04-03 DIAGNOSIS — D461 Refractory anemia with ring sideroblasts: Secondary | ICD-10-CM | POA: Diagnosis not present

## 2022-04-03 DIAGNOSIS — D539 Nutritional anemia, unspecified: Secondary | ICD-10-CM

## 2022-04-03 DIAGNOSIS — E538 Deficiency of other specified B group vitamins: Secondary | ICD-10-CM

## 2022-04-03 MED ORDER — DIPHENHYDRAMINE HCL 25 MG PO CAPS
25.0000 mg | ORAL_CAPSULE | Freq: Once | ORAL | Status: AC
  Administered 2022-04-03: 25 mg via ORAL
  Filled 2022-04-03: qty 1

## 2022-04-03 MED ORDER — SODIUM CHLORIDE 0.9% IV SOLUTION
250.0000 mL | Freq: Once | INTRAVENOUS | Status: AC
  Administered 2022-04-03: 250 mL via INTRAVENOUS

## 2022-04-03 MED ORDER — ACETAMINOPHEN 325 MG PO TABS
650.0000 mg | ORAL_TABLET | Freq: Once | ORAL | Status: DC
  Filled 2022-04-03: qty 2

## 2022-04-03 MED ORDER — CYANOCOBALAMIN 1000 MCG/ML IJ SOLN
1000.0000 ug | INTRAMUSCULAR | Status: DC
  Administered 2022-04-03: 1000 ug via INTRAMUSCULAR
  Filled 2022-04-03: qty 1

## 2022-04-03 MED ORDER — ACETAMINOPHEN 325 MG PO TABS
650.0000 mg | ORAL_TABLET | Freq: Once | ORAL | Status: AC
  Administered 2022-04-03: 650 mg via ORAL
  Filled 2022-04-03: qty 2

## 2022-04-03 NOTE — Progress Notes (Signed)
Pt was not able to take B-12 injection post palliative appt on 04/02/22, pt to receive b-12 injection during blood transfusion encounter today, 04/03/22, per Lexine Baton, NP, order released and RN notified.

## 2022-04-03 NOTE — Patient Instructions (Signed)

## 2022-04-03 NOTE — Telephone Encounter (Signed)
Scheduled per 02/21 los, patient has been called and voicemail was left. °

## 2022-04-04 ENCOUNTER — Emergency Department (HOSPITAL_COMMUNITY)
Admission: EM | Admit: 2022-04-04 | Discharge: 2022-04-04 | Disposition: A | Discharge status: Home / Self Care | Payer: Medicare Other | Attending: Emergency Medicine | Admitting: Emergency Medicine

## 2022-04-04 ENCOUNTER — Emergency Department (HOSPITAL_COMMUNITY): Payer: Medicare Other

## 2022-04-04 ENCOUNTER — Other Ambulatory Visit: Payer: Self-pay

## 2022-04-04 DIAGNOSIS — S8991XA Unspecified injury of right lower leg, initial encounter: Secondary | ICD-10-CM | POA: Diagnosis present

## 2022-04-04 DIAGNOSIS — S8391XA Sprain of unspecified site of right knee, initial encounter: Secondary | ICD-10-CM | POA: Insufficient documentation

## 2022-04-04 DIAGNOSIS — X58XXXA Exposure to other specified factors, initial encounter: Secondary | ICD-10-CM | POA: Diagnosis not present

## 2022-04-04 LAB — TYPE AND SCREEN
ABO/RH(D): O NEG
Antibody Screen: NEGATIVE
Unit division: 0
Unit division: 0

## 2022-04-04 LAB — BPAM RBC
Blood Product Expiration Date: 202403032359
Blood Product Expiration Date: 202403062359
ISSUE DATE / TIME: 202402221102
ISSUE DATE / TIME: 202402221102
Unit Type and Rh: 9500
Unit Type and Rh: 9500

## 2022-04-04 MED ORDER — ACETAMINOPHEN 325 MG PO TABS
650.0000 mg | ORAL_TABLET | Freq: Once | ORAL | Status: DC
  Filled 2022-04-04: qty 2

## 2022-04-04 NOTE — ED Triage Notes (Addendum)
Pt is coming from home, pt called out today for right knee pain that began around 0300. Some swelling noted in knee, no mechanism of injury recalled. Pt also had a blood transfusion yesterday for her anemia.

## 2022-04-04 NOTE — ED Provider Notes (Signed)
[facility] Provider Note   CSN: [id number] Arrival date & time: 04/04/22  0820     History  Chief Complaint  Patient presents with   Knee Pain    Megan Hester is a 89 y.o. female.  This is a 89 year old female presents with right knee and right hip pain that began after patient got out of bed this morning.  Patient states he twisted the knee.  Not complains of sharp pain worse with movement to both her right hip and right knee.  Denies any ankle pain.  Called EMS and transported here       Home Medications Prior to Admission medications   Medication Sig Start Date End Date Taking? Authorizing Provider  amiodarone (PACERONE) 200 MG tablet Take 200 mg by mouth daily with supper.    [provider]  cetirizine (ZYRTEC) 10 MG tablet Take 10 mg by mouth every evening.    [provider]  Cholecalciferol (VITAMIN D) 1000 UNITS capsule Take 1,000 Units by mouth daily with supper.    [provider]  clotrimazole-betamethasone (LOTRISONE) cream Apply 1 application  topically 2 (two) times daily as needed (to affected areas). 12/31/20   [provider]  Glucosamine HCl POWD Take 8.5 g by mouth See admin instructions. Mix 8.5 grams (0.5 scoopful) into juice and drink once a day, as tolerated    [provider]  Hypromellose (ARTIFICIAL TEARS OP) Place 1 drop into the left eye 3 (three) times daily as needed (for dryness).    [provider]  Melatonin 10 MG TABS Take 10 mg by mouth at bedtime.    [provider]  methocarbamol (ROBAXIN) 500 MG tablet Take 500 mg by mouth every 8 (eight) hours as needed for muscle spasms. 03/06/22   [provider]  metoprolol succinate (TOPROL XL) 25 MG 24 hr tablet Take 1 tablet (25 mg total) by mouth daily. Patient taking differently: Take 25 mg by mouth in the morning. 05/30/21 05/31/22  Donato Heinz, MD  Multiple  Vitamins-Minerals (PRESERVISION/LUTEIN PO) Take 1 capsule by mouth 2 (two) times daily.     [provider]  pantoprazole (PROTONIX) 40 MG tablet Take 1 tablet (40 mg total) by mouth 2 (two) times daily. 01/18/22 01/18/23  Dwyane Dee, MD  Polyethyl Glycol-Propyl Glycol (SYSTANE OP) Place 1 drop into the right eye daily as needed (for dryness).    [provider]  polyethylene glycol (MIRALAX / GLYCOLAX) packet Take 8.5 g by mouth See admin instructions. Mix 8.5 grams (0.5 scoopful) into suggested amount of water and drink once a day- HOLD FOR DIARRHEA    [provider]  QUEtiapine (SEROQUEL) 25 MG tablet Take 25 mg by mouth at bedtime. 12/11/19   [provider]  sertraline (ZOLOFT) 100 MG tablet Take 100 mg by mouth at bedtime.    [provider]  traMADol (ULTRAM) 50 MG tablet Take 50 mg by mouth every 8 (eight) hours as needed for moderate pain or severe pain. 03/25/22   [provider]  XARELTO 15 MG TABS tablet TAKE ONE TABLET BY MOUTH ONCE DAILY WITH SUPPER 03/28/22   Donato Heinz, MD      Allergies    Shellfish allergy, Atropine, Codeine, Doxycycline, Erythromycin, Fenoprofen calcium, Iodinated contrast media, Isopto hyoscine [scopolamine], Naproxen, Other, Oxycodone, Penicillins, Sulfa drugs cross reactors, Warfarin, Ceclor [cefaclor], Levaquin [levofloxacin], and Tape    Review of Systems   Review of Systems  All other systems reviewed and are negative.   Physical Exam Updated Vital Signs BP (!) 145/68   Pulse 64   Temp 98.4 F (36.9 C) (Oral)   Resp 18   Ht 1.626 m ('5\' 4"'$ )   Wt 59.9 kg   SpO2 100%   BMI 22.66 kg/m  Physical Exam Vitals and nursing note reviewed.  Constitutional:      General: She is not in acute distress.    Appearance: Normal appearance. She is well-developed. She is not toxic-appearing.  HENT:     Head: Normocephalic and atraumatic.  Eyes:     General: Lids are normal.      Conjunctiva/sclera: Conjunctivae normal.     Pupils: Pupils are equal, round, and reactive to light.  Neck:     Thyroid: No thyroid mass.     Trachea: No tracheal deviation.  Cardiovascular:     Rate and Rhythm: Normal rate and regular rhythm.     Heart sounds: Normal heart sounds. No murmur heard.    No gallop.  Pulmonary:     Effort: Pulmonary effort is normal. No respiratory distress.     Breath sounds: Normal breath sounds. No stridor. No decreased breath sounds, wheezing, rhonchi or rales.  Abdominal:     General: There is no distension.     Palpations: Abdomen is soft.     Tenderness: There is no abdominal tenderness. There is no rebound.  Musculoskeletal:        General: No tenderness.     Cervical back: Normal range of motion and neck supple.     Right knee: Swelling and effusion present. Decreased range of motion.       Legs:  Skin:    General: Skin is warm and dry.     Findings: No abrasion or rash.  Neurological:     Mental Status: She is alert and oriented to person, place, and time. Mental status is at baseline.     GCS: GCS eye subscore is 4. GCS verbal subscore is 5. GCS motor subscore is 6.     Cranial Nerves: No cranial nerve deficit.     Sensory: No sensory deficit.     Motor: Motor function is intact.  Psychiatric:        Attention and Perception: Attention normal.        Speech: Speech normal.        Behavior: Behavior normal.     ED Results / Procedures / Treatments   Labs (all labs ordered are listed, but only abnormal results are displayed) Labs Reviewed - No data to display  EKG None  Radiology No results found.  Procedures Procedures    Medications Ordered in ED Medications - No data to display  ED Course/ Medical Decision Making/ A&P                             Medical Decision Making Amount and/or Complexity of Data Reviewed Radiology: ordered.   Patient here with mechanical fall just prior to arrival.  Patient's right knee  is negative for fracture per my interpretation.  Her right hip also is negative for fracture.  Does have a large suprapatellar joint effusion on the right knee.  Likely posttraumatic.  Plan will be to place patient in a knee immobilizer and she will follow-up with her orthopedic surgeon        Final Clinical Impression(s) / ED Diagnoses Final diagnoses:  None  Rx / DC Orders ED Discharge Orders     None         Lacretia Leigh, MD 04/04/22 1041

## 2022-04-04 NOTE — Discharge Instructions (Signed)
Call your orthopedic surgeon today to schedule an appointment to be seen next week.

## 2022-04-07 ENCOUNTER — Encounter: Payer: Self-pay | Admitting: Physician Assistant

## 2022-04-07 NOTE — Progress Notes (Signed)
Remote pacemaker transmission.   

## 2022-04-16 ENCOUNTER — Inpatient Hospital Stay: Payer: Medicare Other | Attending: Internal Medicine

## 2022-04-16 ENCOUNTER — Other Ambulatory Visit: Payer: Self-pay

## 2022-04-16 DIAGNOSIS — E538 Deficiency of other specified B group vitamins: Secondary | ICD-10-CM | POA: Insufficient documentation

## 2022-04-16 DIAGNOSIS — Z9049 Acquired absence of other specified parts of digestive tract: Secondary | ICD-10-CM | POA: Insufficient documentation

## 2022-04-16 DIAGNOSIS — D464 Refractory anemia, unspecified: Secondary | ICD-10-CM

## 2022-04-16 DIAGNOSIS — D46C Myelodysplastic syndrome with isolated del(5q) chromosomal abnormality: Secondary | ICD-10-CM | POA: Insufficient documentation

## 2022-04-16 LAB — CBC WITH DIFFERENTIAL (CANCER CENTER ONLY)
Abs Immature Granulocytes: 0.01 10*3/uL (ref 0.00–0.07)
Basophils Absolute: 0 10*3/uL (ref 0.0–0.1)
Basophils Relative: 1 %
Eosinophils Absolute: 0.2 10*3/uL (ref 0.0–0.5)
Eosinophils Relative: 4 %
HCT: 24.2 % — ABNORMAL LOW (ref 36.0–46.0)
Hemoglobin: 8.1 g/dL — ABNORMAL LOW (ref 12.0–15.0)
Immature Granulocytes: 0 %
Lymphocytes Relative: 35 %
Lymphs Abs: 1.9 10*3/uL (ref 0.7–4.0)
MCH: 33.9 pg (ref 26.0–34.0)
MCHC: 33.5 g/dL (ref 30.0–36.0)
MCV: 101.3 fL — ABNORMAL HIGH (ref 80.0–100.0)
Monocytes Absolute: 0.4 10*3/uL (ref 0.1–1.0)
Monocytes Relative: 8 %
Neutro Abs: 2.8 10*3/uL (ref 1.7–7.7)
Neutrophils Relative %: 52 %
Platelet Count: 276 10*3/uL (ref 150–400)
RBC: 2.39 MIL/uL — ABNORMAL LOW (ref 3.87–5.11)
RDW: 25.7 % — ABNORMAL HIGH (ref 11.5–15.5)
WBC Count: 5.3 10*3/uL (ref 4.0–10.5)
nRBC: 0 % (ref 0.0–0.2)

## 2022-04-16 LAB — SAMPLE TO BLOOD BANK

## 2022-04-18 ENCOUNTER — Ambulatory Visit (HOSPITAL_COMMUNITY)
Admission: RE | Admit: 2022-04-18 | Discharge: 2022-04-18 | Disposition: A | Discharge status: Home / Self Care | Payer: Medicare Other | Source: Ambulatory Visit | Attending: Orthopaedic Surgery | Admitting: Orthopaedic Surgery

## 2022-04-18 ENCOUNTER — Other Ambulatory Visit (HOSPITAL_COMMUNITY): Payer: Self-pay | Admitting: Orthopaedic Surgery

## 2022-04-18 DIAGNOSIS — M79604 Pain in right leg: Secondary | ICD-10-CM

## 2022-04-23 ENCOUNTER — Other Ambulatory Visit: Payer: Self-pay | Admitting: Medical Oncology

## 2022-04-23 ENCOUNTER — Other Ambulatory Visit: Payer: Self-pay

## 2022-04-23 ENCOUNTER — Telehealth: Payer: Self-pay | Admitting: Medical Oncology

## 2022-04-23 DIAGNOSIS — D539 Nutritional anemia, unspecified: Secondary | ICD-10-CM

## 2022-04-23 DIAGNOSIS — C801 Malignant (primary) neoplasm, unspecified: Secondary | ICD-10-CM

## 2022-04-23 DIAGNOSIS — D469 Myelodysplastic syndrome, unspecified: Secondary | ICD-10-CM

## 2022-04-23 NOTE — Telephone Encounter (Signed)
Son said  he cannot find her HCPoA and he wants to get them completed. He said he is her POA Referral sent to Trilby Leaver.

## 2022-04-24 ENCOUNTER — Ambulatory Visit: Payer: Medicare Other | Admitting: Internal Medicine

## 2022-04-24 ENCOUNTER — Inpatient Hospital Stay: Payer: Medicare Other | Admitting: Internal Medicine

## 2022-04-24 ENCOUNTER — Ambulatory Visit: Payer: Medicare Other

## 2022-04-24 ENCOUNTER — Inpatient Hospital Stay (HOSPITAL_COMMUNITY)
Admission: EM | Admit: 2022-04-24 | Discharge: 2022-04-26 | DRG: 812 | Disposition: A | Discharge status: Hospice | Payer: Medicare Other | Attending: Internal Medicine | Admitting: Internal Medicine

## 2022-04-24 ENCOUNTER — Inpatient Hospital Stay: Payer: Medicare Other | Admitting: Licensed Clinical Social Worker

## 2022-04-24 ENCOUNTER — Other Ambulatory Visit: Payer: Self-pay

## 2022-04-24 ENCOUNTER — Other Ambulatory Visit: Payer: Medicare Other

## 2022-04-24 ENCOUNTER — Inpatient Hospital Stay: Payer: Medicare Other

## 2022-04-24 ENCOUNTER — Encounter (HOSPITAL_COMMUNITY): Payer: Self-pay

## 2022-04-24 VITALS — BP 69/39 | HR 93 | Temp 97.7°F | Resp 17

## 2022-04-24 DIAGNOSIS — Z823 Family history of stroke: Secondary | ICD-10-CM

## 2022-04-24 DIAGNOSIS — Z888 Allergy status to other drugs, medicaments and biological substances status: Secondary | ICD-10-CM

## 2022-04-24 DIAGNOSIS — Z8249 Family history of ischemic heart disease and other diseases of the circulatory system: Secondary | ICD-10-CM | POA: Diagnosis not present

## 2022-04-24 DIAGNOSIS — Z820 Family history of epilepsy and other diseases of the nervous system: Secondary | ICD-10-CM

## 2022-04-24 DIAGNOSIS — Z880 Allergy status to penicillin: Secondary | ICD-10-CM

## 2022-04-24 DIAGNOSIS — Z9102 Food additives allergy status: Secondary | ICD-10-CM | POA: Diagnosis not present

## 2022-04-24 DIAGNOSIS — Z881 Allergy status to other antibiotic agents status: Secondary | ICD-10-CM

## 2022-04-24 DIAGNOSIS — E119 Type 2 diabetes mellitus without complications: Secondary | ICD-10-CM | POA: Diagnosis present

## 2022-04-24 DIAGNOSIS — R451 Restlessness and agitation: Secondary | ICD-10-CM | POA: Diagnosis present

## 2022-04-24 DIAGNOSIS — T45515A Adverse effect of anticoagulants, initial encounter: Secondary | ICD-10-CM | POA: Diagnosis present

## 2022-04-24 DIAGNOSIS — S8011XA Contusion of right lower leg, initial encounter: Secondary | ICD-10-CM

## 2022-04-24 DIAGNOSIS — Z91048 Other nonmedicinal substance allergy status: Secondary | ICD-10-CM | POA: Diagnosis not present

## 2022-04-24 DIAGNOSIS — H3530 Unspecified macular degeneration: Secondary | ICD-10-CM | POA: Diagnosis present

## 2022-04-24 DIAGNOSIS — Z91041 Radiographic dye allergy status: Secondary | ICD-10-CM | POA: Diagnosis not present

## 2022-04-24 DIAGNOSIS — Z885 Allergy status to narcotic agent status: Secondary | ICD-10-CM

## 2022-04-24 DIAGNOSIS — Z950 Presence of cardiac pacemaker: Secondary | ICD-10-CM | POA: Diagnosis not present

## 2022-04-24 DIAGNOSIS — K219 Gastro-esophageal reflux disease without esophagitis: Secondary | ICD-10-CM | POA: Diagnosis present

## 2022-04-24 DIAGNOSIS — Z886 Allergy status to analgesic agent status: Secondary | ICD-10-CM | POA: Diagnosis not present

## 2022-04-24 DIAGNOSIS — D62 Acute posthemorrhagic anemia: Secondary | ICD-10-CM | POA: Diagnosis present

## 2022-04-24 DIAGNOSIS — I4891 Unspecified atrial fibrillation: Secondary | ICD-10-CM | POA: Diagnosis present

## 2022-04-24 DIAGNOSIS — Z853 Personal history of malignant neoplasm of breast: Secondary | ICD-10-CM

## 2022-04-24 DIAGNOSIS — D6832 Hemorrhagic disorder due to extrinsic circulating anticoagulants: Secondary | ICD-10-CM | POA: Diagnosis present

## 2022-04-24 DIAGNOSIS — I480 Paroxysmal atrial fibrillation: Secondary | ICD-10-CM | POA: Diagnosis present

## 2022-04-24 DIAGNOSIS — I4819 Other persistent atrial fibrillation: Secondary | ICD-10-CM

## 2022-04-24 DIAGNOSIS — I10 Essential (primary) hypertension: Secondary | ICD-10-CM | POA: Diagnosis present

## 2022-04-24 DIAGNOSIS — I959 Hypotension, unspecified: Principal | ICD-10-CM | POA: Diagnosis present

## 2022-04-24 DIAGNOSIS — D469 Myelodysplastic syndrome, unspecified: Secondary | ICD-10-CM | POA: Diagnosis present

## 2022-04-24 DIAGNOSIS — Z801 Family history of malignant neoplasm of trachea, bronchus and lung: Secondary | ICD-10-CM

## 2022-04-24 DIAGNOSIS — D539 Nutritional anemia, unspecified: Secondary | ICD-10-CM

## 2022-04-24 DIAGNOSIS — Z91013 Allergy to seafood: Secondary | ICD-10-CM | POA: Diagnosis not present

## 2022-04-24 DIAGNOSIS — Z7901 Long term (current) use of anticoagulants: Secondary | ICD-10-CM

## 2022-04-24 DIAGNOSIS — Z882 Allergy status to sulfonamides status: Secondary | ICD-10-CM

## 2022-04-24 DIAGNOSIS — Z9001 Acquired absence of eye: Secondary | ICD-10-CM | POA: Diagnosis not present

## 2022-04-24 DIAGNOSIS — Z833 Family history of diabetes mellitus: Secondary | ICD-10-CM

## 2022-04-24 DIAGNOSIS — D649 Anemia, unspecified: Secondary | ICD-10-CM

## 2022-04-24 DIAGNOSIS — Z79899 Other long term (current) drug therapy: Secondary | ICD-10-CM

## 2022-04-24 LAB — COMPREHENSIVE METABOLIC PANEL
ALT: 14 U/L (ref 0–44)
AST: 23 U/L (ref 15–41)
Albumin: 3.1 g/dL — ABNORMAL LOW (ref 3.5–5.0)
Alkaline Phosphatase: 111 U/L (ref 38–126)
Anion gap: 13 (ref 5–15)
BUN: 21 mg/dL (ref 8–23)
CO2: 18 mmol/L — ABNORMAL LOW (ref 22–32)
Calcium: 8.5 mg/dL — ABNORMAL LOW (ref 8.9–10.3)
Chloride: 101 mmol/L (ref 98–111)
Creatinine, Ser: 1.02 mg/dL — ABNORMAL HIGH (ref 0.44–1.00)
GFR, Estimated: 53 mL/min — ABNORMAL LOW (ref >60)
Glucose, Bld: 171 mg/dL — ABNORMAL HIGH (ref 70–99)
Potassium: 3.3 mmol/L — ABNORMAL LOW (ref 3.5–5.1)
Sodium: 132 mmol/L — ABNORMAL LOW (ref 135–145)
Total Bilirubin: 2 mg/dL — ABNORMAL HIGH (ref 0.3–1.2)
Total Protein: 5.6 g/dL — ABNORMAL LOW (ref 6.5–8.1)

## 2022-04-24 LAB — CBC WITH DIFFERENTIAL (CANCER CENTER ONLY)
Abs Immature Granulocytes: 0.04 10*3/uL (ref 0.00–0.07)
Basophils Absolute: 0 10*3/uL (ref 0.0–0.1)
Basophils Relative: 1 %
Eosinophils Absolute: 0 10*3/uL (ref 0.0–0.5)
Eosinophils Relative: 0 %
HCT: 14.2 % — ABNORMAL LOW (ref 36.0–46.0)
Hemoglobin: 4.6 g/dL — CL (ref 12.0–15.0)
Immature Granulocytes: 1 %
Lymphocytes Relative: 12 %
Lymphs Abs: 0.8 10*3/uL (ref 0.7–4.0)
MCH: 33.8 pg (ref 26.0–34.0)
MCHC: 32.4 g/dL (ref 30.0–36.0)
MCV: 104.4 fL — ABNORMAL HIGH (ref 80.0–100.0)
Monocytes Absolute: 0.8 10*3/uL (ref 0.1–1.0)
Monocytes Relative: 12 %
Neutro Abs: 5.3 10*3/uL (ref 1.7–7.7)
Neutrophils Relative %: 74 %
Platelet Count: 386 10*3/uL (ref 150–400)
RBC: 1.36 MIL/uL — ABNORMAL LOW (ref 3.87–5.11)
RDW: 27.2 % — ABNORMAL HIGH (ref 11.5–15.5)
WBC Count: 7.1 10*3/uL (ref 4.0–10.5)
nRBC: 3.8 % — ABNORMAL HIGH (ref 0.0–0.2)

## 2022-04-24 LAB — FERRITIN: Ferritin: 1803 ng/mL — ABNORMAL HIGH (ref 11–307)

## 2022-04-24 LAB — IRON AND IRON BINDING CAPACITY (CC-WL,HP ONLY)
Iron: 139 ug/dL (ref 28–170)
Saturation Ratios: 95 % — ABNORMAL HIGH (ref 10.4–31.8)
TIBC: 147 ug/dL — ABNORMAL LOW (ref 250–450)
UIBC: 8 ug/dL — ABNORMAL LOW (ref 148–442)

## 2022-04-24 LAB — HEMOGLOBIN AND HEMATOCRIT, BLOOD
HCT: 26.1 % — ABNORMAL LOW (ref 36.0–46.0)
Hemoglobin: 8.8 g/dL — ABNORMAL LOW (ref 12.0–15.0)

## 2022-04-24 LAB — SAMPLE TO BLOOD BANK

## 2022-04-24 LAB — PREPARE RBC (CROSSMATCH)

## 2022-04-24 MED ORDER — SODIUM CHLORIDE 0.9% FLUSH
3.0000 mL | Freq: Two times a day (BID) | INTRAVENOUS | Status: DC
  Administered 2022-04-24 – 2022-04-26 (×4): 3 mL via INTRAVENOUS

## 2022-04-24 MED ORDER — OXYCODONE HCL 5 MG PO TABS
5.0000 mg | ORAL_TABLET | ORAL | Status: DC | PRN
  Administered 2022-04-24 – 2022-04-25 (×3): 5 mg via ORAL
  Filled 2022-04-24 (×3): qty 1

## 2022-04-24 MED ORDER — ACETAMINOPHEN 325 MG PO TABS
650.0000 mg | ORAL_TABLET | Freq: Four times a day (QID) | ORAL | Status: DC | PRN
  Filled 2022-04-24: qty 2

## 2022-04-24 MED ORDER — ACETAMINOPHEN 650 MG RE SUPP: 650.0000 mg | Freq: Four times a day (QID) | RECTAL | Status: DC | PRN

## 2022-04-24 MED ORDER — SERTRALINE HCL 100 MG PO TABS
100.0000 mg | ORAL_TABLET | Freq: Every day | ORAL | Status: DC
  Administered 2022-04-24 – 2022-04-25 (×2): 100 mg via ORAL
  Filled 2022-04-24: qty 1
  Filled 2022-04-24: qty 2

## 2022-04-24 MED ORDER — SODIUM CHLORIDE 0.9% IV SOLUTION: Freq: Once | INTRAVENOUS | Status: AC

## 2022-04-24 MED ORDER — QUETIAPINE FUMARATE 25 MG PO TABS
25.0000 mg | ORAL_TABLET | Freq: Every day | ORAL | Status: DC
  Administered 2022-04-25 (×2): 25 mg via ORAL
  Filled 2022-04-24 (×2): qty 1

## 2022-04-24 MED ORDER — AMIODARONE HCL 200 MG PO TABS
200.0000 mg | ORAL_TABLET | Freq: Every day | ORAL | Status: DC
  Administered 2022-04-25: 200 mg via ORAL
  Filled 2022-04-24: qty 1

## 2022-04-24 NOTE — ED Notes (Signed)
Blood bank has blood ready, informed Hannah,RN.

## 2022-04-24 NOTE — Hospital Course (Addendum)
Ms. Hibbert is an 89 yo female with PMH low grade MDS, GERD, HTN, macular degeneration, PAF, retinal hemorrhage s/p right eye enucleation who presented originally to her oncology office for a follow up visit. She was found to be weak, lethargic, hypotensive, tachycardic, and hemoglobin returned at 4.6 g/dL. She was referred to the ER for transfusions and further workup.  She was noted to have large bruising on her right lower extremity as well.  She began having pain and swelling in her right knee approximately 3 weeks ago.  She attempted to have aspiration of the right knee outpatient with ortho but since then has had worsening ecchymoses. She was ordered 3 units PRBC in the ER.  Blood pressure was noted to be 77/52 initially.  She is admitted for hemoglobin monitoring while receiving blood transfusions and close monitoring of blood pressure. Hemoglobin responded well to 3 units PRBC given on admission.  The following morning hemoglobin had improved to 8.4 g/dL.  Hypotension had also improved and blood pressure returned to normal range.

## 2022-04-24 NOTE — ED Provider Notes (Signed)
[facility] Provider Note   CSN: [id number] Arrival date & time: 04/24/22  [id number]     History  Chief Complaint  Patient presents with   Hypotension    Megan Hester is a 89 y.o. female.  Patient is an 89 year old female with a history of hypertension, paroxysmal atrial fibrillation on Xarelto, GERD, myelodysplastic syndrome requiring recurrent blood transfusions who is presenting today from the cancer center due to hypotension, weakness and generally feeling unwell.  Patient's daughter-in-law is present and her son is on the phone who gives the history.  They report that patient has multiple things going on.  In November she had a fall which resulted in a compression fracture that went untreated for quite some time and she now wears a brace for that but then 3 weeks ago she started having significant pain and swelling in her right knee.  They report after a week or so it got better for a short time and then the pain and swelling returned with some bruising.  She went and saw an orthopedist not long ago and they tried to aspirate her knee but reported it was hemarthrosis and no blood came out.  After that time she developed more ecchymosis and in the last 2 days she has had ecchymosis that spread all the way up her leg and into her knee and upper calf.  The pain has become more severe as well.  Her son reports that she had a blood transfusion approximately 3 weeks ago.  She has not had fever, cough or syncope.  However she has become so weak it is hard for her even to get up to use the bathroom.  Her daughter-in-law often notices that she also appears to be out of breath.  At oncology's office today they checked her hemoglobin and it was 4.6.  The history is provided by the patient and a caregiver.       Home Medications Prior to Admission medications   Medication Sig Start Date End Date Taking? Authorizing Provider  amiodarone (PACERONE)  200 MG tablet Take 200 mg by mouth daily with supper.    [provider]  cetirizine (ZYRTEC) 10 MG tablet Take 10 mg by mouth every evening.    [provider]  Cholecalciferol (VITAMIN D) 1000 UNITS capsule Take 1,000 Units by mouth daily with supper.    [provider]  clotrimazole-betamethasone (LOTRISONE) cream Apply 1 application  topically 2 (two) times daily as needed (to affected areas). 12/31/20   [provider]  Glucosamine HCl POWD Take 8.5 g by mouth See admin instructions. Mix 8.5 grams (0.5 scoopful) into juice and drink once a day, as tolerated    [provider]  Hypromellose (ARTIFICIAL TEARS OP) Place 1 drop into the left eye 3 (three) times daily as needed (for dryness).    [provider]  Melatonin 10 MG TABS Take 10 mg by mouth at bedtime.    [provider]  methocarbamol (ROBAXIN) 500 MG tablet Take 500 mg by mouth every 8 (eight) hours as needed for muscle spasms. 03/06/22   [provider]  metoprolol succinate (TOPROL XL) 25 MG 24 hr tablet Take 1 tablet (25 mg total) by mouth daily. Patient taking differently: Take 25 mg by mouth in the morning. 05/30/21 05/31/22  Donato Heinz, MD  Multiple Vitamins-Minerals (PRESERVISION/LUTEIN PO) Take 1 capsule by mouth 2 (two) times daily.     [provider]  pantoprazole (PROTONIX) 40 MG tablet Take 1 tablet (40 mg total) by mouth 2 (two) times daily. 01/18/22 01/18/23  Dwyane Dee, MD  Polyethyl Glycol-Propyl Glycol (SYSTANE OP) Place 1 drop into the right eye daily as needed (for dryness).    [provider]  polyethylene glycol (MIRALAX / GLYCOLAX) packet Take 8.5 g by mouth See admin instructions. Mix 8.5 grams (0.5 scoopful) into suggested amount of water and drink once a day- HOLD FOR DIARRHEA    [provider]  QUEtiapine (SEROQUEL) 25 MG tablet Take 25 mg by mouth at bedtime. 12/11/19   [provider]   sertraline (ZOLOFT) 100 MG tablet Take 100 mg by mouth at bedtime.    [provider]  traMADol (ULTRAM) 50 MG tablet Take 50 mg by mouth every 8 (eight) hours as needed for moderate pain or severe pain. 03/25/22   [provider]  XARELTO 15 MG TABS tablet TAKE ONE TABLET BY MOUTH ONCE DAILY WITH SUPPER 03/28/22   Donato Heinz, MD      Allergies    Shellfish allergy, Atropine, Codeine, Doxycycline, Erythromycin, Fenoprofen calcium, Iodinated contrast media, Isopto hyoscine [scopolamine], Naproxen, Other, Oxycodone, Penicillins, Sulfa drugs cross reactors, Warfarin, Ceclor [cefaclor], Levaquin [levofloxacin], and Tape    Review of Systems   Review of Systems  Physical Exam Updated Vital Signs BP 96/65 (BP Location: Right Arm)   Pulse 93   Temp 97.8 F (36.6 C) (Oral)   Resp 20   Ht '5\' 4"'$  (1.626 m)   Wt 60 kg   SpO2 100%   BMI 22.71 kg/m  Physical Exam Vitals and nursing note reviewed.  Constitutional:      General: She is not in acute distress.    Appearance: She is well-developed. She is ill-appearing.  HENT:     Head: Normocephalic and atraumatic.  Eyes:     Pupils: Pupils are equal, round, and reactive to light.     Comments: Pale conjunctive a  Cardiovascular:     Rate and Rhythm: Regular rhythm. Tachycardia present.     Heart sounds: Murmur heard.     No friction rub.  Pulmonary:     Effort: Pulmonary effort is normal. Tachypnea present.     Breath sounds: Normal breath sounds. No wheezing or rales.  Abdominal:     General: Bowel sounds are normal. There is no distension.     Palpations: Abdomen is soft.     Tenderness: There is no abdominal tenderness. There is no guarding or rebound.  Musculoskeletal:        General: Tenderness present. Normal range of motion.     Comments: Patient has significant right knee effusion with severe pain and ecchymosis spreading from the upper thigh more posteriorly all the way down to the upper calf.  It  is in various stages of healing.  1+ DP pulse present in the foot.  No erythema or warmth in the joint.  Skin:    General: Skin is warm and dry.     Findings: No rash.  Neurological:     Mental Status: She is alert.     Cranial Nerves: No cranial nerve deficit.     Comments: Patient is awake and follows commands.  She lets her family do the talking.  Noted to move all extremities  Psychiatric:        Behavior: Behavior normal.     ED Results / Procedures / Treatments   Labs (all labs ordered are listed, but only  abnormal results are displayed) Labs Reviewed  COMPREHENSIVE METABOLIC PANEL - Abnormal; Notable for the following components:      Result Value   Sodium 132 (*)    Potassium 3.3 (*)    CO2 18 (*)    Glucose, Bld 171 (*)    Creatinine, Ser 1.02 (*)    Calcium 8.5 (*)    Total Protein 5.6 (*)    Albumin 3.1 (*)    Total Bilirubin 2.0 (*)    GFR, Estimated 53 (*)    All other components within normal limits  TYPE AND SCREEN  PREPARE RBC (CROSSMATCH)    EKG EKG Interpretation  Date/Time:  Thursday April 24 2022 09:59:28 EDT Ventricular Rate:  96 PR Interval:    QRS Duration: 148 QT Interval:  431 QTC Calculation: 545 R Axis:   39 Text Interpretation: Atrial fibrillation Right bundle branch block Repol abnrm suggests ischemia, diffuse leads No significant change since last tracing Confirmed by Blanchie Dessert [phone]) on 04/24/2022 11:42:23 AM  Radiology No results found.  Procedures Procedures    Medications Ordered in ED Medications  0.9 %  sodium chloride infusion (Manually program via Guardrails IV Fluids) (has no administration in time range)    ED Course/ Medical Decision Making/ A&P                             Medical Decision Making Amount and/or Complexity of Data Reviewed Independent Historian: caregiver External Data Reviewed: notes.    Details: oncology Labs: ordered. Decision-making details documented in ED Course. ECG/medicine  tests: ordered and independent interpretation performed. Decision-making details documented in ED Course.  Risk Prescription drug management.   Pt with multiple medical problems and comorbidities and presenting today with a complaint that caries a high risk for morbidity and mortality.  Here today with multiple issues.  Patient is hypotensive, tachycardic and has a known hemoglobin from oncology's office today of 4.6.  Concern for symptomatic anemia.  After laying down in the bed getting her legs up patient's blood pressure improved to 99/62.  Patient is still awake and able to follow commands.  Patient does have myelodysplastic syndrome which would explain some of the anemia but she also has a very large hematoma in her leg and concerned that that has also attributed to her anemia.  No evidence of a septic joint at this time.  Family reports she had been seeing an orthopedist and had an hemarthrosis.  They then attempted to tap the joint this past week and do a steroid injection but nothing came out.  She has been taking tramadol Tylenol and methocarbamol for pain but has become weaker and having difficulty getting up and moving around.  She has not had any infectious symptoms.  On exam breath sounds are clear.  No significant swelling in her other leg or concern for CHF.  Patient had a Doppler study done on her leg last week which was negative for DVT.  Feel that patient will need a blood transfusion and admission.  3 units ordered. I independently interpreted patient's labs and CMP with mild AKI today with creatinine 1.02, mild hypokalemia of 3.3.  EKG with diffuse ST abnormalities which is not significantly different from prior EKGs.  On reevaluation patient is unchanged, blood pressure remains in the upper 90s with normal maps.  She is going to get blood starting now.  Will admit for further care.  CRITICAL CARE Performed by: [employer]  Total critical care time: 30 minutes Critical care time  was exclusive of separately billable procedures and treating other patients. Critical care was necessary to treat or prevent imminent or life-threatening deterioration. Critical care was time spent personally by me on the following activities: development of treatment plan with patient and/or surrogate as well as nursing, discussions with consultants, evaluation of patient's response to treatment, examination of patient, obtaining history from patient or surrogate, ordering and performing treatments and interventions, ordering and review of laboratory studies, ordering and review of radiographic studies, pulse oximetry and re-evaluation of patient's condition.         Final Clinical Impression(s) / ED Diagnoses Final diagnoses:  Hypotension, unspecified hypotension type  Symptomatic anemia  Hematoma of right lower extremity, initial encounter    Rx / DC Orders ED Discharge Orders     None         Blanchie Dessert, MD 04/24/22 1145

## 2022-04-24 NOTE — Progress Notes (Signed)
       CROSS COVER NOTE  Name: Megan Hester  DOB: 03-May-1930  [id number]  DOA: 04/24/2022  Level of care: Progressive    Date of Service   04/24/2022     HPI/Events of Note   Notified by nursing staff that patient is restless and mildly confused.     RN reports that patient has removed medical equipment and has also attempted to get out of bed multiple times.  High fall risk.  Patient unable to be redirected by staff at this time. Safety sitter not available at this time.   Scheduled Seroquel to be given by RN. For the safety of the patient, lapbelt restraint has been placed. Restraint to be discontinued as soon as patient is able to safely have them removed.    Interventions/ Plan   Seroquel  Restraint         Raenette Rover, DNP, [place]

## 2022-04-24 NOTE — ED Notes (Signed)
Pt keeps yelling for "Richardson Landry reminded pt that she is at the hospital and not at home, where he son Richardson Landry is. Pt also continuously removing cardiac monitoring equipment.

## 2022-04-24 NOTE — ED Triage Notes (Addendum)
Patient sent over from the cancer center due to blood pressure of 60/40. Patients hemoglobin was 4.6 at the cancer center. Sample sent to blood bank. Bone cancer.

## 2022-04-24 NOTE — ED Notes (Signed)
Pt is now resting quietly with eyes closed. RR even and unlabored

## 2022-04-24 NOTE — Assessment & Plan Note (Addendum)
-   Holding Xarelto in setting of severe anemia; discussed with son.  Likely will hold approximately 1 week unless right thigh bruising worsens in which case may need to be held longer - resume toprol

## 2022-04-24 NOTE — Progress Notes (Signed)
Pt hgb 4.6, pale , lethargic , sitting in wheelchair,not holding up head.  Report called " Lysbeth Galas". Pt wheeled to ED, Dtr in law states pt has large bruise on R thigh.

## 2022-04-24 NOTE — Assessment & Plan Note (Signed)
-   Last A1c 5.5% on 01/15/2022 - Continue diet control

## 2022-04-24 NOTE — Assessment & Plan Note (Addendum)
-   follows with Dr. Julien Nordmann, seen 3/14 and sent to ER for severe anemia - remains on B12 injections and intermittent PRBC -Hemoglobin responded well to 3 units PRBC

## 2022-04-24 NOTE — Assessment & Plan Note (Signed)
Noted  

## 2022-04-24 NOTE — ED Notes (Signed)
Pt incontinent of urine- removed soiled pants, changed underpad and brief, warm blanket provided.

## 2022-04-24 NOTE — H&P (Signed)
History and Physical    Megan Hester  [id number]  DOB: 10-Jul-1930  DOA: 04/24/2022  PCP: [name], MD Patient coming from: Home  Chief Complaint: low Hgb  HPI:  Megan Hester is an 89 yo female with PMH low grade MDS, GERD, HTN, macular degeneration, PAF, retinal hemorrhage s/p right eye enucleation who presented originally to her oncology office for a follow up visit. She was found to be weak, lethargic, hypotensive, tachycardic, and hemoglobin returned at 4.6 g/dL. She was referred to the ER for transfusions and further workup.  She was noted to have large bruising on her right lower extremity as well.  She began having pain and swelling in her right knee approximately 3 weeks ago.  She attempted to have aspiration of the right knee outpatient with ortho but since then has had worsening ecchymoses. She was ordered 3 units PRBC in the ER.  Blood pressure was noted to be 77/52 initially.  She is admitted for hemoglobin monitoring while receiving blood transfusions and close monitoring of blood pressure.  I have personally briefly reviewed patient's old medical records in [facility] and discussed patient with the ER provider when appropriate/indicated.  Assessment and Plan: * Acute blood loss anemia - Suspected multifactorial component with recent right knee attempted arthrocentesis along with ongoing anticoagulation with Xarelto and underlying baseline MDS which already requires periodic transfusions - Hemoglobin 4.6 g/dL.  3 units PRBC ordered in the ER -Hold Xarelto - trend H/H  MDS (myelodysplastic syndrome) ([place]) - follows with Megan Hester, seen 3/14 and sent to ER for severe anemia - remains on B12 injections and intermittent PRBC  DM (diabetes mellitus) ([place]) - Last A1c 5.5% on 01/15/2022 - Continue diet control  History of enucleation of right eyeball - Noted  A-fib ([place]) - Holding Xarelto in setting of severe anemia - Toprol also on hold in  setting of significant hypotension    Code Status:     Code Status: Full Code  DVT Prophylaxis: SCD     Anticipated disposition is to: Home  History: Past Medical History:  Diagnosis Date   Arthritis    "all over my body" (02/25/2016)   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   GERD (gastroesophageal reflux disease)    hx; "when I was working"   Hypertension    Macular degeneration    Paroxysmal atrial fibrillation ([place])    Presence of permanent cardiac pacemaker    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    Past Surgical History:  Procedure Laterality Date   APPLICATION OF WOUND VAC Bilateral 02/01/2019   Procedure: APPLICATION OF WOUND VAC;  Surgeon: [name], MD;  Location: [facility];  Service: General;  Laterality: Bilateral;   BIOPSY  01/17/2022   Procedure: BIOPSY;  Surgeon: Wilford Corner, MD;  [facility]: [facility];  Service: Gastroenterology;;   BREAST BIOPSY Right    BREAST LUMPECTOMY Right 1999   RADIATION   CARDIAC CATHETERIZATION  02/05/1999   HYPERDYNAMIC LEFT VENTRICLE WITH EF OF 75-80%   CARDIOVERSION N/A 01/22/2015   Procedure: CARDIOVERSION;  Surgeon: [name], MD;  Location: [place];  Service: Cardiovascular;  Laterality: N/A;   CARDIOVERSION N/A 02/22/2015   Procedure: CARDIOVERSION;  Surgeon: [name], MD;  Location: [place];  Service: Cardiovascular;  Laterality: N/A;   DILATION AND CURETTAGE OF UTERUS     ENUCLEATION Right 2012   EP IMPLANTABLE DEVICE N/A 02/25/2016   Procedure: Pacemaker  Implant;  Surgeon: [name], MD;  Location: [place] [facility];  Service: Cardiovascular;  Laterality: N/A;   ESOPHAGOGASTRODUODENOSCOPY (EGD) WITH PROPOFOL N/A 01/17/2022   Procedure: ESOPHAGOGASTRODUODENOSCOPY (EGD) WITH PROPOFOL;  Surgeon: Wilford Corner, MD;  [facility];  Service: Gastroenterology;  Laterality: N/A;   INSERT / REPLACE / REMOVE PACEMAKER     LAPAROSCOPIC SMALL BOWEL RESECTION N/A  02/01/2019   Procedure: LAPAROSCOPIC SMALL BOWEL RESECTION WITH TAP BLOCK;  Surgeon: [name], MD;  Location[facility];  Service: General;  Laterality: N/A;   LAPAROSCOPY N/A 02/01/2019   Procedure: LAPAROSCOPY DIAGNOSTIC;  Surgeon: [name], MD;  [facility];  Service: General;  Laterality: N/A;   LYSIS OF ADHESION N/A 02/01/2019   Procedure: LYSIS OF ADHESION;  Surgeon: [name], MD;  Location: [facility];  Service: General;  Laterality: N/A;   TONSILLECTOMY       reports that she has never smoked. She has never used smokeless tobacco. She reports current alcohol use. She reports that she does not use drugs.  Allergies  Allergen Reactions   Shellfish Allergy Hives, Swelling and Other (See Comments)    Shrimp and lobster only   Atropine Other (See Comments)    Causes A-FIB   Codeine Other (See Comments)    Leg pain   Doxycycline Other (See Comments)    Unknown reaction   Erythromycin Other (See Comments)    Unknown reaction   Fenoprofen Calcium Swelling   Iodinated Contrast Media Hives and Other (See Comments)   Isopto Hyoscine [Scopolamine]     Causes AFIB   Naproxen Other (See Comments)    Unknown reaction   Other Swelling and Other (See Comments)    Nutrasweet - Bleeding N[place]on - swelling   Oxycodone Other (See Comments)    Depression - pt tolerates as needed    Penicillins     Has patient had a PCN reaction causing immediate rash, facial/tongue/throat swelling, SOB or lightheadedness with hypotension: unknown Has patient had a PCN reaction causing severe rash involving mucus membranes or skin necrosis: unknown Has patient had a PCN reaction that required hospitalization unknown Has patient had a PCN reaction occurring within the last 10 years: childhood If all of the above answers are "NO", then may proceed with Cephalosporin use. unknown   Sulfa Drugs Cross Reactors Swelling and Other (See Comments)    Ankle swelling    Warfarin Other (See Comments)     Bleeding from eyes   Ceclor [Cefaclor] Hives   Levaquin [Levofloxacin] Rash and Other (See Comments)    Rash & causes AFIB   Tape Rash    Use paper tape    Family History  Problem Relation Age of Onset   Hypertension Father    Heart attack Father    Parkinsonism Father    Diabetes Brother    Heart disease Brother    Lung cancer Brother    Stroke Mother    Home Medications: Prior to Admission medications   Medication Sig Start Date End Date Taking? Authorizing Provider  amiodarone (PACERONE) 200 MG tablet Take 200 mg by mouth daily with supper.    [provider]  cetirizine (ZYRTEC) 10 MG tablet Take 10 mg by mouth every evening.    [provider]  Cholecalciferol (VITAMIN D) 1000 UNITS capsule Take 1,000 Units by mouth daily with supper.    [provider]  clotrimazole-betamethasone (LOTRISONE) cream Apply 1 application  topically 2 (two) times daily as needed (to affected areas).  12/31/20   [provider]  Glucosamine HCl POWD Take 8.5 g by mouth See admin instructions. Mix 8.5 grams (0.5 scoopful) into juice and drink once a day, as tolerated    [provider]  Hypromellose (ARTIFICIAL TEARS OP) Place 1 drop into the left eye 3 (three) times daily as needed (for dryness).    [provider]  Melatonin 10 MG TABS Take 10 mg by mouth at bedtime.    [provider]  methocarbamol (ROBAXIN) 500 MG tablet Take 500 mg by mouth every 8 (eight) hours as needed for muscle spasms. 03/06/22   [provider]  metoprolol succinate (TOPROL XL) 25 MG 24 hr tablet Take 1 tablet (25 mg total) by mouth daily. Patient taking differently: Take 25 mg by mouth in the morning. 05/30/21 05/31/22  Donato Heinz, MD  Multiple Vitamins-Minerals (PRESERVISION/LUTEIN PO) Take 1 capsule by mouth 2 (two) times daily.     [provider]  pantoprazole (PROTONIX) 40 MG tablet Take 1 tablet (40 mg total) by mouth 2 (two)  times daily. 01/18/22 01/18/23  Dwyane Dee, MD  Polyethyl Glycol-Propyl Glycol (SYSTANE OP) Place 1 drop into the right eye daily as needed (for dryness).    [provider]  polyethylene glycol (MIRALAX / GLYCOLAX) packet Take 8.5 g by mouth See admin instructions. Mix 8.5 grams (0.5 scoopful) into suggested amount of water and drink once a day- HOLD FOR DIARRHEA    [provider]  QUEtiapine (SEROQUEL) 25 MG tablet Take 25 mg by mouth at bedtime. 12/11/19   [provider]  sertraline (ZOLOFT) 100 MG tablet Take 100 mg by mouth at bedtime.    [provider]  traMADol (ULTRAM) 50 MG tablet Take 50 mg by mouth every 8 (eight) hours as needed for moderate pain or severe pain. 03/25/22   [provider]  XARELTO 15 MG TABS tablet TAKE ONE TABLET BY MOUTH ONCE DAILY WITH SUPPER 03/28/22   Donato Heinz, MD    Review of Systems:  Review of Systems  Constitutional:  Positive for malaise/fatigue.  HENT: Negative.    Eyes: Negative.   Respiratory: Negative.    Cardiovascular: Negative.   Gastrointestinal: Negative.   Genitourinary: Negative.   Musculoskeletal: Negative.   Skin: Negative.   Neurological: Negative.   Endo/Heme/Allergies:  Bruises/bleeds easily.  Psychiatric/Behavioral: Negative.      Physical Exam:  Vitals:   04/24/22 1130 04/24/22 1145 04/24/22 1200 04/24/22 1213  BP: 96/65 (!) 93/55 (!) 103/55 (!) 109/56  Pulse: 93 95 94 96  Resp: '20 15 18 16  '$ Temp: 97.8 F (36.6 C)   97.9 F (36.6 C)  TempSrc: Oral   Oral  SpO2: 100% 100% 100% 100%  Weight:      Height:       Physical Exam Constitutional:      General: She is not in acute distress.    Appearance: Normal appearance.  HENT:     Head: Normocephalic and atraumatic.     Mouth/Throat:     Mouth: Mucous membranes are moist.  Eyes:     Comments: Right eye enucleation noted  Cardiovascular:     Rate and Rhythm: Normal rate and regular rhythm.  Pulmonary:      Effort: Pulmonary effort is normal. No respiratory distress.     Breath sounds: Normal breath sounds. No wheezing.  Abdominal:     General: Bowel sounds are normal. There is no distension.     Palpations: Abdomen  is soft.     Tenderness: There is no abdominal tenderness.  Musculoskeletal:        General: Swelling (Swelling appreciated in right lower thigh,no significant tenderness and compartment is soft) present. Normal range of motion.     Cervical back: Normal range of motion and neck supple.  Skin:    General: Skin is warm and dry.  Neurological:     Mental Status: She is alert.     Comments: No acute focal deficits  Psychiatric:        Mood and Affect: Mood normal.      Labs on Admission:  I have personally reviewed following labs and imaging studies Results for orders placed or performed during the hospital encounter of 04/24/22 (from the past 24 hour(s))  Type and screen [place]     Status: None (Preliminary result)   Collection Time: 04/24/22  8:55 AM  Result Value Ref Range   ABO/RH(D) O NEG    Antibody Screen NEG    Sample Expiration 04/27/2022,2359    Unit Number [id number]    Blood Component Type RED CELLS,LR    Unit division 00    Status of Unit ISSUED    Transfusion Status OK TO TRANSFUSE    Crossmatch Result      Compatible Performed at [facility], [place] [address], [place], [place] [address]    Unit Number [id number]    Blood Component Type RED CELLS,LR    Unit division 00    Status of Unit ALLOCATED    Transfusion Status OK TO TRANSFUSE    Crossmatch Result Compatible   Prepare RBC (crossmatch)     Status: None   Collection Time: 04/24/22  8:55 AM  Result Value Ref Range   Order Confirmation      ORDER PROCESSED BY BLOOD BANK Performed at [facility], [place] [address] [place], [place] [address]   Comprehensive metabolic panel     Status: Abnormal   Collection Time: 04/24/22 10:11 AM   Result Value Ref Range   Sodium 132 (L) 135 - 145 mmol/L   Potassium 3.3 (L) 3.5 - 5.1 mmol/L   Chloride 101 98 - 111 mmol/L   CO2 18 (L) 22 - 32 mmol/L   Glucose, Bld 171 (H) 70 - 99 mg/dL   BUN 21 8 - 23 mg/dL   Creatinine, Ser 1.02 (H) 0.44 - 1.00 mg/dL   Calcium 8.5 (L) 8.9 - 10.3 mg/dL   Total Protein 5.6 (L) 6.5 - 8.1 g/dL   Albumin 3.1 (L) 3.5 - 5.0 g/dL   AST 23 15 - 41 U/L   ALT 14 0 - 44 U/L   Alkaline Phosphatase 111 38 - 126 U/L   Total Bilirubin 2.0 (H) 0.3 - 1.2 mg/dL   GFR, Estimated 53 (L) >60 mL/min   Anion gap 13 5 - 15     Radiological Exams on Admission: No results found. No orders to display    Consults called:     EKG: Independently reviewed. Afib   Dwyane Dee, MD [facility] 04/24/2022, 2:05 PM

## 2022-04-24 NOTE — Progress Notes (Signed)
[facility] CSW Progress Note  Holiday representative  received a call from pt's son regarding concerns about providing care at home.  Pt reportedly has significantly declined physically since Thanksgiving due to multiple medical issues.  Pt resides w/ her son and daughter in law.  At present pt's care needs are overwhelming and they are in need of assistance.  Per son pt's PCP received a request to submit documentation for a hospital bed in December as well as a request for home care.  Pt still does not have a hospital bed and home care has been limited.  CSW instructed son to follow back up w/ the PCP regarding the hospital bed as the PCP would be able to submit comprehensive documentation and may just need to follow up w/ the DME company.  CSW explained the difference between private pay home care and the limitations of home care covered by Medicare.  Pt may need to transition to a higher level of care due to her multiple medical conditions that may need to be paid for out of pocket.  CSW suggested a referral be made to [facility] for a comprehensive assessment in the home to which pt's son verbalized agreement.  Whitney contacted from [facility] and son's contact details provided.  CSW also emailed son a list of private pay home care agencies and contact information for [employer].  Pt's pain reportedly is poorly managed at this time.  CSW suggested pt's son contact the orthopedic doctor as the pain issues are arising from orthopedic conditions.  Pt is also booked on 3/22 to complete Advance Directives.  CSW to remain available as appropriate to provide support.       Henriette Combs, LCSW

## 2022-04-24 NOTE — Progress Notes (Signed)
[facility] [phone]   [phone]  OFFICE PROGRESS NOTE  [name], MD [place] [address]  DIAGNOSIS:  Low-grade myelodysplastic syndrome with ring sideroblasts.  There was also an element of vitamin B12 deficiency after small bowel resection for an obstruction in December 2020.  PRIOR THERAPY:None  CURRENT THERAPY:  1) Vitamin B12 injection initially weekly then every 2 weeks and then monthly. 2) supportive care with transfusion on as-needed basis  INTERVAL HISTORY: Megan Hester 89 y.o. female is to the clinic today for routine follow-up visit accompanied by her daughter-in-law.  The patient is so weak and tired and very hypotensive.  She has no chest pain but has shortness of breath with minimal exertion and significant fatigue.  She has no nausea, vomiting, diarrhea or constipation.  She has some bruises on her right side.  She received PRBCs transfusion a week ago but her hemoglobin is currently down to 4.6 with a blood pressure of 65/49.  She is tachycardic with a heart rate of 93.  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  MEDICATIONS:  Current Outpatient Medications  Medication Sig Dispense Refill   amiodarone (PACERONE) 200 MG tablet Take 200 mg  by mouth daily with supper.     cetirizine (ZYRTEC) 10 MG tablet Take 10 mg by mouth every evening.     Cholecalciferol (VITAMIN D) 1000 UNITS capsule Take 1,000 Units by mouth daily with supper.     clotrimazole-betamethasone (LOTRISONE) cream Apply 1 application  topically 2 (two) times daily as needed (to affected areas).     Glucosamine HCl POWD Take 8.5 g by mouth See admin instructions. Mix 8.5 grams (0.5 scoopful) into juice and drink once a day, as tolerated     Hypromellose (ARTIFICIAL TEARS OP) Place 1 drop into the left eye 3 (three) times daily as needed (for dryness).     Melatonin 10 MG TABS Take 10 mg by mouth at bedtime.     methocarbamol (ROBAXIN) 500 MG tablet Take 500 mg by mouth every 8 (eight) hours as needed for muscle spasms.     metoprolol succinate (TOPROL XL) 25 MG 24 hr tablet Take 1 tablet (25 mg total) by mouth daily. (Patient taking differently: Take 25 mg by mouth in the morning.) 90 tablet 3   Multiple Vitamins-Minerals (PRESERVISION/LUTEIN PO) Take 1 capsule by mouth 2 (two) times daily.      pantoprazole (PROTONIX) 40 MG tablet Take 1 tablet (40 mg total) by mouth 2 (two) times daily. 60 tablet 3   Polyethyl Glycol-Propyl Glycol (SYSTANE OP) Place 1 drop into the right eye daily as needed (for dryness).     polyethylene glycol (MIRALAX / GLYCOLAX) packet Take 8.5 g by mouth See admin instructions. Mix 8.5 grams (0.5 scoopful) into suggested amount of water and  drink once a day- HOLD FOR DIARRHEA     QUEtiapine (SEROQUEL) 25 MG tablet Take 25 mg by mouth at bedtime.     sertraline (ZOLOFT) 100 MG tablet Take 100 mg by mouth at bedtime.     traMADol (ULTRAM) 50 MG tablet Take 50 mg by mouth every 8 (eight) hours as needed for moderate pain or severe pain.     XARELTO 15 MG TABS tablet TAKE ONE TABLET BY MOUTH ONCE DAILY WITH SUPPER 30 tablet 5   No current facility-administered medications for this visit.   Facility-Administered Medications Ordered in Other Visits   Medication Dose Route Frequency Provider Last Rate Last Admin   cyanocobalamin (VITAMIN B12) injection 1,000 mcg  1,000 mcg Intramuscular Q30 days Curt Bears, MD   1,000 mcg at 02/24/22 [id number]    SURGICAL HISTORY:  Past Surgical History:  Procedure Laterality Date   APPLICATION OF WOUND VAC Bilateral 02/01/2019   Procedure: APPLICATION OF WOUND VAC;  Surgeon: [name], MD;  Location: [facility];  Service: General;  Laterality: Bilateral;   BIOPSY  01/17/2022   Procedure: BIOPSY;  Surgeon: Wilford Corner, MD;  Location: [facility];  Service: Gastroenterology;;   BREAST BIOPSY Right    BREAST LUMPECTOMY Right 1999   RADIATION   CARDIAC CATHETERIZATION  02/05/1999   HYPERDYNAMIC LEFT VENTRICLE WITH EF OF 75-80%   CARDIOVERSION N/A 01/22/2015   Procedure: CARDIOVERSION;  Surgeon: [name], MD;  Location: [facility];  Service: Cardiovascular;  Laterality: N/A;   CARDIOVERSION N/A 02/22/2015   Procedure: CARDIOVERSION;  Surgeon: [name], MD;  Location: [facility];  Service: Cardiovascular;  Laterality: N/A;   DILATION AND CURETTAGE OF UTERUS     ENUCLEATION Right 2012   EP IMPLANTABLE DEVICE N/A 02/25/2016   Procedure: Pacemaker Implant;  Surgeon: [name], MD;  Location: [facility];  Service: Cardiovascular;  Laterality: N/A;   ESOPHAGOGASTRODUODENOSCOPY (EGD) WITH PROPOFOL N/A 01/17/2022   Procedure: ESOPHAGOGASTRODUODENOSCOPY (EGD) WITH PROPOFOL;  Surgeon: Wilford Corner, MD;  [facility]: [facility];  Service: Gastroenterology;  Laterality: N/A;   INSERT / REPLACE / REMOVE PACEMAKER     LAPAROSCOPIC SMALL BOWEL RESECTION N/A 02/01/2019   Procedure: LAPAROSCOPIC SMALL BOWEL RESECTION WITH TAP BLOCK;  Surgeon: [name], MD;  Location: [facility];  Service: General;  Laterality: N/A;   LAPAROSCOPY N/A 02/01/2019   Procedure: LAPAROSCOPY DIAGNOSTIC;  Surgeon: [name], MD;  Location: [facility];  Service: General;  Laterality: N/A;   LYSIS OF  ADHESION N/A 02/01/2019   Procedure: LYSIS OF ADHESION;  Surgeon: [name], MD;  Location: [facility];  Service: General;  Laterality: N/A;   TONSILLECTOMY      REVIEW OF SYSTEMS:  A comprehensive review of systems was negative except for: Constitutional: positive for fatigue Respiratory: positive for dyspnea on exertion Musculoskeletal: positive for back pain Neurological: positive for dizziness and weakness   PHYSICAL EXAMINATION: General appearance: alert, cooperative, cachectic, fatigued, mild distress, and pale Head: Normocephalic, without obvious abnormality, atraumatic, very pale Neck: no adenopathy, no JVD, supple, symmetrical, trachea midline, and thyroid not enlarged, symmetric, no tenderness/mass/nodules Lymph nodes: Cervical, supraclavicular, and axillary nodes normal. Resp: clear to auscultation bilaterally Back: symmetric, no curvature. ROM normal. No CVA tenderness. Cardio: regular rate and rhythm, S1, S2 normal, no murmur, click, rub or gallop GI: soft, non-tender; bowel sounds normal; no masses,  no organomegaly Extremities: extremities normal, atraumatic, no cyanosis or edema  ECOG PERFORMANCE STATUS: 1 - Symptomatic but completely ambulatory  Blood pressure [name] Kitchen)  69/39, pulse 93, temperature 97.7 F (36.5 C), temperature source Oral, resp. rate 17.  LABORATORY DATA: Lab Results  Component Value Date   WBC 7.1 04/24/2022   HGB 4.6 (LL) 04/24/2022   HCT 14.2 (L) 04/24/2022   MCV 104.4 (H) 04/24/2022   PLT 386 04/24/2022      Chemistry      Component Value Date/Time   NA 141 01/18/2022 0301   NA 141 12/04/2021 1414   K 3.3 (L) 01/18/2022 0301   CL 115 (H) 01/18/2022 0301   CO2 19 (L) 01/18/2022 0301   BUN 14 01/18/2022 0301   BUN 19 12/04/2021 1414   CREATININE 0.77 01/18/2022 0301   CREATININE 1.02 (H) 08/28/2020 1123      Component Value Date/Time   CALCIUM 8.2 (L) 01/18/2022 0301   ALKPHOS 115 01/14/2022 1546   AST 17 01/14/2022 1546   AST 13 (L)  08/28/2020 1123   ALT 14 01/14/2022 1546   ALT 7 08/28/2020 1123   BILITOT 0.9 01/14/2022 1546   BILITOT 0.5 12/04/2021 1414   BILITOT 0.8 08/28/2020 1123       RADIOGRAPHIC STUDIES: VAS [place] LOWER EXTREMITY VENOUS (DVT)  Result Date: 04/20/2022  Lower Venous DVT Study Patient Name:  TANILLE TOUGAS [facility]  Date of Exam:   04/18/2022 Medical Rec #: [id number]            Accession #:    [id number] Date of Birth: Nov 19, 1930           Patient Gender: F Patient Age:   8 years Exam Location:  [facility] Procedure:      VAS [place] LOWER EXTREMITY VENOUS (DVT) Referring Phys: Collier Salina DALLDORF --------------------------------------------------------------------------------  Indications: Pain, and Swelling. Other Indications: History of lumbar spine condition. Comparison Study: No priors. Performing Technologist: Oda Cogan RDMS, RVT  Examination Guidelines: A complete evaluation includes B-mode imaging, spectral Doppler, color Doppler, and power Doppler as needed of all accessible portions of each vessel. Bilateral testing is considered an integral part of a complete examination. Limited examinations for reoccurring indications may be performed as noted. The reflux portion of the exam is performed with the patient in reverse Trendelenburg.  +---------+---------------+---------+-----------+----------+--------------+ RIGHT    CompressibilityPhasicitySpontaneityPropertiesThrombus Aging +---------+---------------+---------+-----------+----------+--------------+ CFV      Full           Yes      Yes                                 +---------+---------------+---------+-----------+----------+--------------+ SFJ      Full                                                        +---------+---------------+---------+-----------+----------+--------------+ FV Prox  Full                                                         +---------+---------------+---------+-----------+----------+--------------+ FV Mid   Full                                                        +---------+---------------+---------+-----------+----------+--------------+  FV DistalFull                                                        +---------+---------------+---------+-----------+----------+--------------+ PFV      Full                                                        +---------+---------------+---------+-----------+----------+--------------+ POP      Full           Yes      Yes                                 +---------+---------------+---------+-----------+----------+--------------+ PTV      Full                                                        +---------+---------------+---------+-----------+----------+--------------+ PERO     Full                                                        +---------+---------------+---------+-----------+----------+--------------+   +----+---------------+---------+-----------+----------+--------------+ LEFTCompressibilityPhasicitySpontaneityPropertiesThrombus Aging +----+---------------+---------+-----------+----------+--------------+ CFV Full           Yes      Yes                                 +----+---------------+---------+-----------+----------+--------------+ SFJ Full                                                        +----+---------------+---------+-----------+----------+--------------+    Summary: RIGHT: - There is no evidence of deep vein thrombosis in the lower extremity.  - No cystic structure found in the popliteal fossa.  LEFT: - No evidence of common femoral vein obstruction.  *See table(s) above for measurements and observations. Electronically signed by Jamelle Haring on 04/20/2022 at 4:24:05 PM.    Final    DG Knee Right Port  Result Date: 04/04/2022 CLINICAL DATA:  Provided history: Swelling/pain. EXAM: PORTABLE RIGHT KNEE -  1-2 VIEW COMPARISON:  Knee radiographs 11/13/2007. FINDINGS: There is normal bony alignment. No acute fracture is identified. Mild-to-moderate medial tibiofemoral compartment joint space narrowing within associated osteophyte along the medial aspect of the joint space. Enthesophytes along the superior pole of the patella. Large suprapatellar joint effusion. IMPRESSION: 1. No evidence of acute fracture. 2. Large suprapatellar joint effusion. 3. Degenerative changes, as described Electronically Signed   By: Kellie Simmering D.O.   On: 04/04/2022 10:00   DG Hip Unilat W or Wo Pelvis 2-3 Views Right  Result Date: 04/04/2022 CLINICAL DATA:  Acute onset right knee pain.  No known injury. EXAM: DG HIP (WITH OR WITHOUT PELVIS) 4V RIGHT COMPARISON:  CTA abdomen and pelvis dated 01/14/2022 FINDINGS: There is no evidence of hip fracture or dislocation. Degenerative changes of the right hip. Large volume stool in the rectum. IMPRESSION: 1. No evidence of hip fracture or dislocation. 2. Large volume stool in the rectum. Electronically Signed   By: Darrin Nipper M.D.   On: 04/04/2022 10:00    ASSESSMENT AND PLAN: This is a very pleasant 89 years old white female with recently diagnosed low-grade myelodysplastic syndrome in addition to questionable vitamin B12 deficiency.   The patient had a bone marrow biopsy and aspirate.   The cytogenetics showed no 5 q. deletion. The patient looks very bad today and she is hypotensive with hemoglobin of 4.6.  She is almost dying.  We will send her immediately to the emergency department for further evaluation and management.  She will need PRBCs transfusion as well as further evaluation of her condition.  I strongly discussed with the patient and her daughter-in-law consideration of hospice and palliative care from now.  I also strongly recommend for them to consider no CODE BLUE if she has any cardiac or respiratory arrest. If the patient survives this admission, I will arrange for her to  come back for follow-up visit in around 7-14 days and monitor her as needed if she is not on the hospice service. The patient voices understanding of current disease status and treatment options and is in agreement with the current care plan.  All questions were answered. The patient knows to call the clinic with any problems, questions or concerns. We can certainly see the patient much sooner if necessary.   Disclaimer: This note was dictated with voice recognition software. Similar sounding words can inadvertently be transcribed and may not be corrected upon review.

## 2022-04-24 NOTE — ED Notes (Signed)
Pt restless, still says that she is at her house, states that this RN is lying to her when attempting to reorient. Pt has removed all cardiac monitoring wires. Provider notified of pt status.

## 2022-04-24 NOTE — Assessment & Plan Note (Addendum)
-   Suspected multifactorial component with recent right knee attempted arthrocentesis along with ongoing anticoagulation with Xarelto and underlying baseline MDS which already requires periodic transfusions - Hemoglobin 4.6 g/dL.  3 units PRBC ordered in the ER -Hold Xarelto x 1 week approx - trend H/H; Hgb 8.4 g/dL this morning.

## 2022-04-25 ENCOUNTER — Telehealth: Payer: Self-pay | Admitting: Medical Oncology

## 2022-04-25 ENCOUNTER — Inpatient Hospital Stay: Payer: Medicare Other | Admitting: Licensed Clinical Social Worker

## 2022-04-25 DIAGNOSIS — D469 Myelodysplastic syndrome, unspecified: Secondary | ICD-10-CM

## 2022-04-25 LAB — BPAM RBC
Blood Product Expiration Date: 202404042359
Blood Product Expiration Date: 202404042359
Blood Product Expiration Date: 202404042359
ISSUE DATE / TIME: 202403141149
ISSUE DATE / TIME: 202403141539
ISSUE DATE / TIME: 202403141849
Unit Type and Rh: 9500
Unit Type and Rh: 9500
Unit Type and Rh: 9500

## 2022-04-25 LAB — BASIC METABOLIC PANEL
Anion gap: 4 — ABNORMAL LOW (ref 5–15)
BUN: 21 mg/dL (ref 8–23)
CO2: 24 mmol/L (ref 22–32)
Calcium: 8.1 mg/dL — ABNORMAL LOW (ref 8.9–10.3)
Chloride: 104 mmol/L (ref 98–111)
Creatinine, Ser: 0.79 mg/dL (ref 0.44–1.00)
GFR, Estimated: >60 mL/min (ref >60)
Glucose, Bld: 93 mg/dL (ref 70–99)
Potassium: 3.2 mmol/L — ABNORMAL LOW (ref 3.5–5.1)
Sodium: 132 mmol/L — ABNORMAL LOW (ref 135–145)

## 2022-04-25 LAB — TYPE AND SCREEN
ABO/RH(D): O NEG
Antibody Screen: NEGATIVE
Unit division: 0
Unit division: 0
Unit division: 0

## 2022-04-25 LAB — HEMOGLOBIN AND HEMATOCRIT, BLOOD
HCT: 24.6 % — ABNORMAL LOW (ref 36.0–46.0)
HCT: 24.2 % — ABNORMAL LOW (ref 36.0–46.0)
Hemoglobin: 8.5 g/dL — ABNORMAL LOW (ref 12.0–15.0)
Hemoglobin: 8.4 g/dL — ABNORMAL LOW (ref 12.0–15.0)

## 2022-04-25 LAB — MAGNESIUM: Magnesium: 1.9 mg/dL (ref 1.7–2.4)

## 2022-04-25 MED ORDER — SODIUM CHLORIDE 0.9% FLUSH: 10.0000 mL | INTRAVENOUS | Status: DC | PRN

## 2022-04-25 MED ORDER — RIVAROXABAN 15 MG PO TABS: 15.0000 mg | ORAL_TABLET | Freq: Every day | ORAL | 5 refills | Status: DC

## 2022-04-25 MED ORDER — SODIUM CHLORIDE 0.9% FLUSH: 3.0000 mL | INTRAVENOUS | Status: DC | PRN

## 2022-04-25 MED ORDER — POTASSIUM CHLORIDE CRYS ER 20 MEQ PO TBCR
40.0000 meq | EXTENDED_RELEASE_TABLET | Freq: Once | ORAL | Status: AC
  Administered 2022-04-25: 40 meq via ORAL
  Filled 2022-04-25: qty 2

## 2022-04-25 MED ORDER — HEPARIN SOD (PORK) LOCK FLUSH 100 UNIT/ML IV SOLN: 500.0000 [IU] | Freq: Every day | INTRAVENOUS | Status: DC | PRN

## 2022-04-25 MED ORDER — ORAL CARE MOUTH RINSE: 15.0000 mL | OROMUCOSAL | Status: DC | PRN

## 2022-04-25 MED ORDER — HEPARIN SOD (PORK) LOCK FLUSH 100 UNIT/ML IV SOLN: 250.0000 [IU] | INTRAVENOUS | Status: DC | PRN

## 2022-04-25 NOTE — ED Notes (Addendum)
ED TO INPATIENT HANDOFF REPORT  ED Nurse Name and Phone #: Issiac Jamar RN  S Name/Age/Gender Megan Hester 89 y.o. female Room/Bed: WA18/WA18  Code Status   Code Status: Full Code  Home/SNF/Other Home - previous notes show pt is from home. Unable to collaborate information at this time as pt is confused without family at bedside Patient oriented to: self Is this baseline? Yes   Triage Complete: Triage complete  Chief Complaint Acute blood loss anemia [D62]  Triage Note Patient sent over from the cancer center due to blood pressure of 60/40. Patients hemoglobin was 4.6 at the cancer center. Sample sent to blood bank. Bone cancer.   Allergies Allergies  Allergen Reactions   Shellfish Allergy Hives, Swelling and Other (See Comments)    Shrimp and lobster only   Atropine Other (See Comments)    Causes A-FIB   Codeine Other (See Comments)    Leg pain   Doxycycline Other (See Comments)    Unknown reaction   Erythromycin Other (See Comments)    Unknown reaction   Fenoprofen Calcium Swelling   Iodinated Contrast Media Hives and Other (See Comments)   Isopto Hyoscine [Scopolamine]     Causes AFIB   Naproxen Other (See Comments)    Unknown reaction   Other Swelling and Other (See Comments)    Nutrasweet - Bleeding Naphon - swelling   Oxycodone Other (See Comments)    Depression - pt tolerates as needed    Penicillins     Has patient had a PCN reaction causing immediate rash, facial/tongue/throat swelling, SOB or lightheadedness with hypotension: unknown Has patient had a PCN reaction causing severe rash involving mucus membranes or skin necrosis: unknown Has patient had a PCN reaction that required hospitalization unknown Has patient had a PCN reaction occurring within the last 10 years: childhood If all of the above answers are "NO", then may proceed with Cephalosporin use. unknown   Sulfa Drugs Cross Reactors Swelling and Other (See Comments)    Ankle swelling     Warfarin Other (See Comments)    Bleeding from eyes   Ceclor [Cefaclor] Hives   Levaquin [Levofloxacin] Rash and Other (See Comments)    Rash & causes AFIB   Tape Rash    Use paper tape    Level of Care/Admitting Diagnosis ED Disposition     ED Disposition  Admit   Condition  --   [place]: [place] [100102]  Level of Care: Progressive [102]  Admit to Progressive based on following criteria: MULTISYSTEM THREATS such as stable sepsis, metabolic/electrolyte imbalance with or without encephalopathy that is responding to early treatment.  May admit patient to [facility] or [facility] if equivalent level of care is available:: No  Covid Evaluation: Asymptomatic - no recent exposure (last 10 days) testing not required  Diagnosis: Acute blood loss anemia [id number]  Admitting Physician: Dwyane Dee [phone]  Attending Physician: Dwyane Dee [id number]  Certification:: I certify this patient will need inpatient services for at least 2 midnights  Estimated Length of Stay: 2          B Medical/Surgery History Past Medical History:  Diagnosis Date   Arthritis    "all over my body" (02/25/2016)   Cancer of right breast ([place]) 1999   DCIS   GERD (gastroesophageal reflux disease)    hx; "when I was working"   Hypertension    Macular degeneration    Paroxysmal atrial fibrillation ([place])    Presence  of permanent cardiac pacemaker    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   Past Surgical History:  Procedure Laterality Date   APPLICATION OF WOUND VAC Bilateral 02/01/2019   Procedure: APPLICATION OF WOUND VAC;  Surgeon: [name], MD;  [facility]: [facility];  Service: General;  Laterality: Bilateral;   BIOPSY  01/17/2022   Procedure: BIOPSY;  Surgeon: Wilford Corner, MD;  [facility];  Service: Gastroenterology;;   BREAST BIOPSY Right    BREAST LUMPECTOMY Right 1999   RADIATION   CARDIAC CATHETERIZATION  02/05/1999    HYPERDYNAMIC LEFT VENTRICLE WITH EF OF 75-80%   CARDIOVERSION N/A 01/22/2015   Procedure: CARDIOVERSION;  Surgeon: [name], MD;  Location: [place];  Service: Cardiovascular;  Laterality: N/A;   CARDIOVERSION N/A 02/22/2015   Procedure: CARDIOVERSION;  Surgeon: [name], MD;  Location: [place];  Service: Cardiovascular;  Laterality: N/A;   DILATION AND CURETTAGE OF UTERUS     ENUCLEATION Right 2012   EP IMPLANTABLE DEVICE N/A 02/25/2016   Procedure: Pacemaker Implant;  Surgeon: [name], MD;  Location: [facility] [facility];  Service: Cardiovascular;  Laterality: N/A;   ESOPHAGOGASTRODUODENOSCOPY (EGD) WITH PROPOFOL N/A 01/17/2022   Procedure: ESOPHAGOGASTRODUODENOSCOPY (EGD) WITH PROPOFOL;  Surgeon: Wilford Corner, MD;  [facility];  Service: Gastroenterology;  Laterality: N/A;   INSERT / REPLACE / REMOVE PACEMAKER     LAPAROSCOPIC SMALL BOWEL RESECTION N/A 02/01/2019   Procedure: LAPAROSCOPIC SMALL BOWEL RESECTION WITH TAP BLOCK;  Surgeon: [name], MD;  [facility];  Service: General;  Laterality: N/A;   LAPAROSCOPY N/A 02/01/2019   Procedure: LAPAROSCOPY DIAGNOSTIC;  Surgeon: [name], MD;  [facility];  Service: General;  Laterality: N/A;   LYSIS OF ADHESION N/A 02/01/2019   Procedure: LYSIS OF ADHESION;  Surgeon: [name], MD;  [facility];  Service: General;  Laterality: N/A;   TONSILLECTOMY       A IV Location/Drains/Wounds Patient Lines/Drains/Airways Status     Active Line/Drains/Airways     Name Placement date Placement time Site Days   Peripheral IV 04/24/22 20 G Anterior;Left Forearm 04/24/22  0955  Forearm  1            Intake/Output Last 24 hours  Intake/Output Summary (Last 24 hours) at 04/25/2022 [id number] Last data filed at 04/24/2022 2030 Gross per 24 hour  Intake 1042.83 ml  Output --  Net 1042.83 ml    Labs/Imaging Results for orders placed or performed during the hospital encounter of  04/24/22 (from the past 48 hour(s))  Type and screen [place]     Status: None (Preliminary result)   Collection Time: 04/24/22  8:55 AM  Result Value Ref Range   ABO/RH(D) O NEG    Antibody Screen NEG    Sample Expiration 04/27/2022,2359    Unit Number [id number]    Blood Component Type RED CELLS,LR    Unit division 00    Status of Unit ISSUED    Transfusion Status OK TO TRANSFUSE    Crossmatch Result Compatible    Unit Number [id number]    Blood Component Type RED CELLS,LR    Unit division 00    Status of Unit ISSUED    Transfusion Status OK TO TRANSFUSE    Crossmatch Result Compatible    Unit Number [id number]    Blood Component Type RED CELLS,LR    Unit division 00    Status of Unit ISSUED  Transfusion Status OK TO TRANSFUSE    Crossmatch Result      Compatible Performed at [facility] [address] [place], [place] [address]   Prepare RBC (crossmatch)     Status: None   Collection Time: 04/24/22  8:55 AM  Result Value Ref Range   Order Confirmation      ORDER PROCESSED BY BLOOD BANK Performed at [facility], [place] [address] [place], [place] [address]   Comprehensive metabolic panel     Status: Abnormal   Collection Time: 04/24/22 10:11 AM  Result Value Ref Range   Sodium 132 (L) 135 - 145 mmol/L   Potassium 3.3 (L) 3.5 - 5.1 mmol/L   Chloride 101 98 - 111 mmol/L   CO2 18 (L) 22 - 32 mmol/L   Glucose, Bld 171 (H) 70 - 99 mg/dL    Comment: Glucose reference range applies only to samples taken after fasting for at least 8 hours.   BUN 21 8 - 23 mg/dL   Creatinine, Ser 1.02 (H) 0.44 - 1.00 mg/dL   Calcium 8.5 (L) 8.9 - 10.3 mg/dL   Total Protein 5.6 (L) 6.5 - 8.1 g/dL   Albumin 3.1 (L) 3.5 - 5.0 g/dL   AST 23 15 - 41 U/L   ALT 14 0 - 44 U/L   Alkaline Phosphatase 111 38 - 126 U/L   Total Bilirubin 2.0 (H) 0.3 - 1.2 mg/dL   GFR, Estimated 53 (L) >60 mL/min    Comment: (NOTE) Calculated  using the CKD-EPI Creatinine Equation (2021)    Anion gap 13 5 - 15    Comment: Performed at [facility], [place] [address] [place], [place] [address]  Hemoglobin and hematocrit, blood     Status: Abnormal   Collection Time: 04/24/22  8:27 PM  Result Value Ref Range   Hemoglobin 8.8 (L) 12.0 - 15.0 g/dL   HCT 26.1 (L) 36.0 - 46.0 %    Comment: Performed at [facility], [place] [address] [place], [place] [address]   No results found.  Pending Labs Unresulted Labs (From admission, onward)     Start     Ordered   04/25/22 [id number]  Basic metabolic panel  Daily,   R     Question:  Specimen collection method  Answer:  Lab=Lab collect   04/24/22 1707   04/25/22 0500  Magnesium  Daily,   R     Question:  Specimen collection method  Answer:  Lab=Lab collect   04/24/22 1707   04/24/22 2000  Hemoglobin and hematocrit, blood  Now then every 8 hours,   R     Question:  Specimen collection method  Answer:  Lab=Lab collect   04/24/22 1707            Vitals/Pain Today's Vitals   04/25/22 0215 04/25/22 0230 04/25/22 0245 04/25/22 0300  BP:    116/88  Pulse: (!) 107 (!) 105 (!) 104 (!) 112  Resp: 14 (!) 21 (!) 24 (!) 26  Temp:      TempSrc:      SpO2: 99% 100% 98% 92%  Weight:      Height:      PainSc:        Isolation Precautions No active isolations  Medications Medications  amiodarone (PACERONE) tablet 200 mg (200 mg Oral Not Given 04/24/22 2230)  QUEtiapine (SEROQUEL) tablet 25 mg (25 mg Oral Not Given 04/24/22 2231)  sertraline (ZOLOFT) tablet 100  mg (100 mg Oral Given 04/24/22 2158)  sodium chloride flush (NS) 0.9 % injection 3 mL (3 mLs Intravenous Given 04/24/22 2158)  acetaminophen (TYLENOL) tablet 650 mg (has no administration in time range)    Or  acetaminophen (TYLENOL) suppository 650 mg (has no administration in time range)  oxyCODONE (Oxy IR/ROXICODONE) immediate release tablet 5 mg (5 mg Oral Given 04/24/22 1639)  0.9 %  sodium  chloride infusion (Manually program via Guardrails IV Fluids) (0 mLs Intravenous Stopped 04/24/22 2022)    Mobility non-ambulatory - Pt has a suspected fall with significant bruising to RLE. Unknown mobility at home. Pt has not gotten up for this RN. Focused Assessments     R Recommendations: See Admitting Provider Note  Report given to:   Additional Notes: - pt has orders for restraints. Restraints have not been needed as pt is easily redirectable for this RN. No interference with care.

## 2022-04-25 NOTE — TOC Progression Note (Addendum)
Transition of Care [facility]) - Progression Note    Patient Details  Name: Megan Hester MRN: [id number] Date of Birth: 03-01-1930  [facility]) CM/SW Contact  Henrietta Dine, RN Phone Number: 04/25/2022, 4:42 PM  Clinical Narrative:    Notified pt's son confirmed hospital bed has been delivered; pt to be transported by [facility]; Dr Sabino Gasser notified via secure chat; awaiting d/c orders.  -[id number]- Dr Sabino Gasser responded and says pt will d/c tomorrow; [facility] will transport pt; pt's son notified.   Expected Discharge Plan: Home w Hospice Care Barriers to Discharge: Continued Medical Work up  Expected Discharge Plan and Services   Discharge Planning Services: CM Consult   Living arrangements for the past 2 months: Single Family Home                 DME Arranged: Hospital bed DME Agency: [employer] Date DME Agency Contacted: 04/25/22 Time DME Agency Contacted: [id number] Representative spoke with at DME Agency: Brenton Grills HH Arranged: RN, PT, OT [place] Agency: [place] Date [place]: 04/25/22 Time [place]: [place] Representative spoke with at [place]: [place] Determinants of Health ([place]) Interventions [place]: No Food Insecurity (01/15/2022)  Housing: Low Risk  (01/15/2022)  Transportation Needs: No Transportation Needs (01/15/2022)  Utilities: Not At Risk (01/15/2022)  Tobacco Use: Low Risk  (04/24/2022)    Readmission Risk Interventions    04/25/2022    9:34 AM 01/16/2022   11:37 AM  Readmission Risk Prevention Plan  Transportation Screening Complete Complete  PCP or Specialist Appt within 3-5 Days Complete Complete  HRI or [place] Complete Complete  Social Work Consult for [place] Planning/Counseling Complete Complete  Palliative Care Screening Not Applicable Not Applicable  Medication Review Press photographer) Complete Complete

## 2022-04-25 NOTE — Progress Notes (Signed)
Progress Note    Megan Hester   [id number]  DOB: 1930-06-17  DOA: 04/24/2022     1 PCP: [name], MD  Initial CC: Low hemoglobin  Hospital Course: Megan Hester is an 89 yo female with PMH low grade MDS, GERD, HTN, macular degeneration, PAF, retinal hemorrhage s/p right eye enucleation who presented originally to her oncology office for a follow up visit. She was found to be weak, lethargic, hypotensive, tachycardic, and hemoglobin returned at 4.6 g/dL. She was referred to the ER for transfusions and further workup.  She was noted to have large bruising on her right lower extremity as well.  She began having pain and swelling in her right knee approximately 3 weeks ago.  She attempted to have aspiration of the right knee outpatient with ortho but since then has had worsening ecchymoses. She was ordered 3 units PRBC in the ER.  Blood pressure was noted to be 77/52 initially.  She is admitted for hemoglobin monitoring while receiving blood transfusions and close monitoring of blood pressure. Hemoglobin responded well to 3 units PRBC given on admission.  The following morning hemoglobin had improved to 8.4 g/dL.  Hypotension had also improved and blood pressure returned to normal range.  Interval History:  Some restlessness and mild confusion overnight and treated with seroquel.  Seen resting comfortably in bed this morning.  Still having some right thigh tenderness over bruising areas. Called and updated son on the phone this morning.  Hospital bed was requested which case manager has worked on.  PT eval also ordered.  Assessment and Plan: * Acute blood loss anemia - Suspected multifactorial component with recent right knee attempted arthrocentesis along with ongoing anticoagulation with Xarelto and underlying baseline MDS which already requires periodic transfusions - Hemoglobin 4.6 g/dL.  3 units PRBC ordered in the ER -Hold Xarelto x 1 week approx - trend H/H; Hgb 8.4  g/dL this morning.  MDS (myelodysplastic syndrome) ([place]) - follows with Dr. Julien Nordmann, seen 3/14 and sent to ER for severe anemia - remains on B12 injections and intermittent PRBC -Hemoglobin responded well to 3 units PRBC  DM (diabetes mellitus) ([place]) - Last A1c 5.5% on 01/15/2022 - Continue diet control  History of enucleation of right eyeball - Noted  A-fib ([place]) - Holding Xarelto in setting of severe anemia; discussed with son.  Likely will hold approximately 1 week unless right thigh bruising worsens in which case may need to be held longer - Toprol also on hold in setting of significant hypotension    Old records reviewed in assessment of this patient  Antimicrobials:   DVT prophylaxis:  SCDs Start: 04/24/22 1600   Code Status:   Code Status: Full Code  Mobility Assessment (last 72 hours)     Mobility Assessment     Row Name 04/25/22 1030 04/25/22 0430 04/24/22 19:30:30       Does patient have an order for bedrest or is patient medically unstable No - Continue assessment Yes- Bedfast (Level 1) - Complete  Simultaneous filing. User may not have seen previous data. Yes- Bedfast (Level 1) - Complete     What is the highest level of mobility based on the progressive mobility assessment? Level 3 (Stands with assist) - Balance while standing  and cannot march in place Level 3 (Stands with assist) - Balance while standing  and cannot march in place --     Is the above level different from baseline mobility prior to current illness? Yes - Recommend  PT order Yes - Recommend PT order --              Barriers to discharge: none Disposition Plan:  Home Status is: Inpt  Objective: Blood pressure 103/64, pulse (!) 108, temperature 98.3 F (36.8 C), temperature source Oral, resp. rate 16, height 5\' 4"  (1.626 m), weight 60 kg, SpO2 100 %.  Examination:  Physical Exam Constitutional:      General: She is not in acute distress.    Appearance: Normal appearance.  HENT:      Head: Normocephalic and atraumatic.     Mouth/Throat:     Mouth: Mucous membranes are moist.  Eyes:     Comments: Right eye enucleation noted  Cardiovascular:     Rate and Rhythm: Normal rate and regular rhythm.  Pulmonary:     Effort: Pulmonary effort is normal. No respiratory distress.     Breath sounds: Normal breath sounds. No wheezing.  Abdominal:     General: Bowel sounds are normal. There is no distension.     Palpations: Abdomen is soft.     Tenderness: There is no abdominal tenderness.  Musculoskeletal:        General: Swelling (Large bruising appreciated over right lateral knee and right medial thigh with associated tenderness to palpation) present. Normal range of motion.     Cervical back: Normal range of motion and neck supple.  Skin:    General: Skin is warm and dry.  Neurological:     Mental Status: She is alert.     Comments: No acute focal deficits  Psychiatric:        Mood and Affect: Mood normal.      Consultants:    Procedures:    Data Reviewed: Results for orders placed or performed during the hospital encounter of 04/24/22 (from the past 24 hour(s))  Hemoglobin and hematocrit, blood     Status: Abnormal   Collection Time: 04/24/22  8:27 PM  Result Value Ref Range   Hemoglobin 8.8 (L) 12.0 - 15.0 g/dL   HCT 26.1 (L) 36.0 - [id number] %  Basic metabolic panel     Status: Abnormal   Collection Time: 04/25/22  5:28 AM  Result Value Ref Range   Sodium 132 (L) 135 - 145 mmol/L   Potassium 3.2 (L) 3.5 - 5.1 mmol/L   Chloride 104 98 - 111 mmol/L   CO2 24 22 - 32 mmol/L   Glucose, Bld 93 70 - 99 mg/dL   BUN 21 8 - 23 mg/dL   Creatinine, Ser 0.79 0.44 - 1.00 mg/dL   Calcium 8.1 (L) 8.9 - 10.3 mg/dL   GFR, Estimated >60 >60 mL/min   Anion gap 4 (L) 5 - 15  Magnesium     Status: None   Collection Time: 04/25/22  5:28 AM  Result Value Ref Range   Magnesium 1.9 1.7 - 2.4 mg/dL  Hemoglobin and hematocrit, blood     Status: Abnormal   Collection Time:  04/25/22  5:28 AM  Result Value Ref Range   Hemoglobin 8.5 (L) 12.0 - 15.0 g/dL   HCT 24.6 (L) 36.0 - 46.0 %  Hemoglobin and hematocrit, blood     Status: Abnormal   Collection Time: 04/25/22 11:32 AM  Result Value Ref Range   Hemoglobin 8.4 (L) 12.0 - 15.0 g/dL   HCT 24.2 (L) 36.0 - 46.0 %    I have reviewed pertinent nursing notes, vitals, labs, and images as necessary. I have ordered labwork to follow  up on as indicated.  I have reviewed the last notes from staff over past 24 hours. I have discussed patient's care plan and test results with nursing staff, CM/SW, and other staff as appropriate.  Time spent: Greater than 50% of the 55 minute visit was spent in counseling/coordination of care for the patient as laid out in the A&P.   LOS: 1 day   Dwyane Dee, MD [facility] 04/25/2022, 1:30 PM

## 2022-04-25 NOTE — ED Notes (Signed)
Pt was found with legs in between stretcher. Seizure pads were placed to help prevent injury. Pt was reoriented by RN

## 2022-04-25 NOTE — Progress Notes (Addendum)
Manufacturing engineer [facility]) Hospital Liaison Note  Received request from Transitions of Care Manager, Hannah Beat., for hospice services at home after discharge. Chart and patient information under review by [facility] physician.    Spoke with son/Steve to initiate education related to hospice philosophy, services, and team approach to care. Richardson Landry  verbalized understanding of information given. Per discussion, the plan is for patient to discharge home via [facility] once cleared to DC.    DME needs discussed. Patient has the following equipment in the home Bed-- [employer] provided Patient requests the following equipment for delivery: N/A  Address verified and is correct in the chart.   Patient is a FULL CODE.  Please provide prescriptions at discharge as needed to ensure ongoing symptom management.    [facility] information and contact numbers given to family & above information shared with TOC.   Please call with any questions/concerns.    Thank you for the opportunity to participate in this patient's care.   Phillis Haggis, MSW [facility] Liaison  [phone]

## 2022-04-25 NOTE — ED Notes (Signed)
Pt was awake and pulling at cord. RN reoriented pt informing her she is in the hospital as she believed people where in her kitchen. Pt was moved room 18 for lower stimulus and lights were turned off. [place] sitter alarm is on. Pt repositioned. Is currently resting comfortably with call bell within reach.

## 2022-04-25 NOTE — Progress Notes (Signed)
[place] [employer] CSW Progress Note  Clinical Education officer, museum  received a call from pt's son regarding pt's current hospital admission.  Pt's son inquiring about hospice as Dr. Julien Nordmann had mentioned home hospice at pt's appointment yesterday and the family would like to pursue comfort care.  CSW notified Dr. Worthy Flank RN who verbalized agreement to follow up on referring pt and will contact son to inform.  CSW to remain available as appropriate.       Henriette Combs, LCSW

## 2022-04-25 NOTE — Evaluation (Signed)
Physical Therapy Evaluation Patient Details Name: Megan Hester MRN: [id number] DOB: [birth year] Today's Date: 04/25/2022  History of Present Illness  89 yo female with PMH low grade MDS, GERD, HTN, macular degeneration, PAF, retinal hemorrhage s/p right eye enucleation who presented originally to her oncology office for a follow up visit. She was found to be weak, lethargic, hypotensive, tachycardic, and hemoglobin returned at 4.6 g/dL.  She was referred to the ER for transfusions and further workup.  She was noted to have large bruising on her right lower extremity as well.  She began having pain and swelling in her right knee approximately 3 weeks ago.  She attempted to have aspiration of the right knee outpatient with ortho but since then has had worsening ecchymoses.  Pt is admitted 04/24/22 for hemoglobin monitoring while receiving blood transfusions and close monitoring of blood pressure.  Clinical Impression  Pt admitted with above diagnosis.  Pt currently with functional limitations due to the deficits listed below (see PT Problem List). Pt will benefit from skilled PT to increase their independence and safety with mobility to allow discharge to the venue listed below.   Pt reports she lives at home with her son and daughter in law.  Pt typically able to ambulate short distances like to her bathroom.  Pt reports dtr in law assists her with dressing. Pt unable to ambulate today due to right knee pain so only able to take a few steps over to recliner.  Pt's mobility likely slowly decreasing with increasing pain per her history of knee pain and swelling about 3 weeks ago.  If family feels able to continue to assist at home, pt would benefit from hospital bed and w/c (pt states she has a w/c but if not then would benefit).  If family feels unable to provide assist, then pt may need SNF.  RN notified of pt's pain with mobility.         Recommendations for follow up therapy are one component of a  multi-disciplinary discharge planning process, led by the attending physician.  Recommendations may be updated based on patient status, additional functional criteria and insurance authorization.  Follow Up Recommendations Home health PT if family is able to provide assist, SNF if family is not able to assist.      Assistance Recommended at Discharge Frequent or constant Supervision/Assistance  Patient can return home with the following  A little help with walking and/or transfers;A little help with bathing/dressing/bathroom;Help with stairs or ramp for entrance;Assistance with cooking/housework    Equipment Recommendations Hospital bed;Wheelchair (measurements PT);Wheelchair cushion (measurements PT) (w/c if pt does not already have one)  Recommendations for Other Services       Functional Status Assessment Patient has had a recent decline in their functional status and demonstrates the ability to make significant improvements in function in a reasonable and predictable amount of time.     Precautions / Restrictions Precautions Precautions: Fall;Back Precaution Comments: back for comfort as pt reports hx of compression fx although not found in chart review history Restrictions Weight Bearing Restrictions: No      Mobility  Bed Mobility Overal bed mobility: Needs Assistance Bed Mobility: Rolling, Sidelying to Sit Rolling: Min assist Sidelying to sit: Max assist       General bed mobility comments: verbal cues for log roll technique as pt reports back pain and hx of compression fx, assist to complete roll, assist for LEs over EOB and trunk upright    Transfers Overall transfer  level: Needs assistance Equipment used: Rolling walker (2 wheels) Transfers: Sit to/from Stand, Bed to chair/wheelchair/BSC Sit to Stand: Min assist   Step pivot transfers: Min assist       General transfer comment: assist to rise, steady and control descent, cues for hand placement and using UEs  through RW for pain control, pt limited by right knee pain and takes antalgic steps, felt unable to ambulate so transferred to recliner    Ambulation/Gait                  Stairs            Wheelchair Mobility    Modified Rankin (Stroke Patients Only)       Balance Overall balance assessment: Mild deficits observed, not formally tested, Needs assistance (pt denies recent falls)         Standing balance support: Bilateral upper extremity supported, Reliant on assistive device for balance Standing balance-Leahy Scale: Poor Standing balance comment: reliant on UE support for pain at this time                             Pertinent Vitals/Pain Pain Assessment Pain Assessment: Faces Faces Pain Scale: Hurts whole lot Pain Location: right knee Pain Descriptors / Indicators: Sore, Grimacing, Guarding Pain Intervention(s): Repositioned, Monitored during session, Patient requesting pain meds-RN notified    Home Living Family/patient expects to be discharged to:: Private residence Living Arrangements: Children (son and dtr in Sports coach) Available Help at Discharge: Family Type of Home: House         Home Layout: Able to live on main level with bedroom/bathroom Home Equipment: Conservation officer, nature (2 wheels);Wheelchair - manual      Prior Function Prior Level of Function : Needs assist             Mobility Comments: reports she is ambulatory short distances with RW ADLs Comments: reports assist from dtr in law with dressing     Hand Dominance        Extremity/Trunk Assessment        Lower Extremity Assessment Lower Extremity Assessment: Generalized weakness;RLE deficits/detail RLE Deficits / Details: ecchymosis and edema present medial thigh and knee       Communication   Communication: HOH  Cognition Arousal/Alertness: Awake/alert Behavior During Therapy: Flat affect Overall Cognitive Status: No family/caregiver present to determine  baseline cognitive functioning                                 General Comments: pt reports feeling confused but able to answer questions appropriately and follow simple commands        General Comments      Exercises     Assessment/Plan    PT Assessment Patient needs continued PT services  PT Problem List Decreased strength;Decreased balance;Decreased mobility;Decreased activity tolerance;Decreased knowledge of use of DME;Pain       PT Treatment Interventions DME instruction;Gait training;Balance training;Therapeutic exercise;Wheelchair mobility training;Functional mobility training;Therapeutic activities;Patient/family education    PT Goals (Current goals can be found in the Care Plan section)  Acute Rehab PT Goals PT Goal Formulation: With patient Time For Goal Achievement: 05/09/22 Potential to Achieve Goals: Good    Frequency Min 3X/week     Co-evaluation               AM-PAC PT "6 Clicks" Mobility  Outcome Measure Help needed  turning from your back to your side while in a flat bed without using bedrails?: A Lot Help needed moving from lying on your back to sitting on the side of a flat bed without using bedrails?: A Lot Help needed moving to and from a bed to a chair (including a wheelchair)?: A Lot Help needed standing up from a chair using your arms (e.g., wheelchair or bedside chair)?: A Lot Help needed to walk in hospital room?: Total Help needed climbing 3-5 steps with a railing? : Total 6 Click Score: 10    End of Session Equipment Utilized During Treatment: Gait belt Activity Tolerance: Patient limited by pain Patient left: in chair;with call bell/phone within reach;with chair alarm set Nurse Communication: Mobility status PT Visit Diagnosis: Difficulty in walking, not elsewhere classified (R26.2);Pain Pain - Right/Left: Right Pain - part of body: Knee    Time: 1257-1310 PT Time Calculation (min) (ACUTE ONLY): 13  min   Charges:   PT Evaluation $PT Eval Low Complexity: 1 Low        Kati PT, DPT Physical Therapist Acute Rehabilitation Services Preferred contact method: Secure Chat Weekend Pager Only: [phone] Office: [place] 04/25/2022, 1:38 PM

## 2022-04-25 NOTE — Progress Notes (Signed)
Pt poor historian, unable to complete admission history

## 2022-04-25 NOTE — Plan of Care (Signed)
  Problem: Clinical Measurements: Goal: Will remain free from infection Outcome: Progressing Goal: Diagnostic test results will improve Outcome: Progressing Goal: Respiratory complications will improve Outcome: Progressing Goal: Cardiovascular complication will be avoided Outcome: Progressing   

## 2022-04-25 NOTE — TOC Progression Note (Signed)
Transition of Care [facility]) - Progression Note    Patient Details  Name: Megan Hester MRN: [id number] Date of Birth: 12-04-1930  [facility] of Care [facility]) CM/SW Contact  Qusai Kem, Juliann Pulse, RN Phone Number: 04/25/2022, 2:54 PM  Clinical Narrative: Noted per [facility] rep Steve(Son) chose home w/hospice. [facility] rep will manage dme.[place] contacted to inform of hospice services to be put in [place] needed.[employer] for dme-hospital bed. PTAR @ d/c.     Expected Discharge Plan: Home w Hospice Care Barriers to Discharge: Continued Medical Work up  Expected Discharge Plan and Services   Discharge Planning Services: CM Consult   Living arrangements for the past 2 months: Single Family Home                 DME Arranged: Hospital bed DME Agency: [employer] Date DME Agency Contacted: 04/25/22 Time DME Agency Contacted: [id number] Representative spoke with at DME Agency: Brenton Grills HH Arranged: RN, PT, OT [place] Agency: [place] Date [place]: 04/25/22 Time [place]: [place] Representative spoke with at [place]: [place] Determinants of Health ([place]) Interventions [place]: No Food Insecurity (01/15/2022)  Housing: Low Risk  (01/15/2022)  Transportation Needs: No Transportation Needs (01/15/2022)  Utilities: Not At Risk (01/15/2022)  Tobacco Use: Low Risk  (04/24/2022)    Readmission Risk Interventions    04/25/2022    9:34 AM 01/16/2022   11:37 AM  Readmission Risk Prevention Plan  Transportation Screening Complete Complete  PCP or Specialist Appt within 3-5 Days Complete Complete  HRI or [place] Complete Complete  Social Work Consult for [place] Planning/Counseling Complete Complete  Palliative Care Screening Not Applicable Not Applicable  Medication Review Press photographer) Complete Complete

## 2022-04-25 NOTE — TOC Initial Note (Addendum)
Transition of Care [facility]) - Initial/Assessment Note    Patient Details  Name: Megan Hester MRN: [id number] Date of Birth: 05-30-1930  Transition of Care [facility]) CM/SW Contact:    Dessa Phi, RN Phone Number: 04/25/2022, 9:31 AM  Clinical Narrative: Noted patient 1:1 restraints.Spoke to Steve(Son)-patient has good support @ home.Richardson Landry is interested in hospital bed for home. Recc PT cons when appropriate.  -9:50a-noted patient not in restraints.await PT recc;hospital bed ordered.              Expected Discharge Plan: Home/Self Care Barriers to Discharge: Continued Medical Work up   Patient Goals and CMS Choice Patient states their goals for this hospitalization and ongoing recovery are::  (Home) CMS Medicare.gov Compare Post Acute Care list provided to:: Patient Represenative (must comment) (Steve(Son)) Choice offered to / list presented to : Adult [place] ownership interest in [facility].provided to:: Adult Children    Expected Discharge Plan and Services   Discharge Planning Services: CM Consult   Living arrangements for the past 2 months: Single Family Home                                      Prior Living Arrangements/Services Living arrangements for the past 2 months: Single Family Home Lives with:: Adult Children   Do you feel safe going back to the place where you live?: Yes          Current home services: DME (rw,3n1,rollator,lift chair, w/c)    Activities of Daily Living      Permission Sought/Granted Permission sought to share information with : Case Manager Permission granted to share information with : Yes, Verbal Permission Granted              Emotional Assessment              Admission diagnosis:  Acute blood loss anemia [D62] Symptomatic anemia [D64.9] Hematoma of right lower extremity, initial encounter [S80.11XA] Hypotension, unspecified hypotension type [I95.9] Patient Active Problem List    Diagnosis Date Noted   Symptomatic anemia 01/14/2022   MDS (myelodysplastic syndrome) ([place]) 07/02/2021   B12 deficiency 12/13/2020   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[place]) 12/13/2020   Deficiency anemia 08/28/2020   SIRS (systemic inflammatory response syndrome) ([place])    Acute blood loss anemia    Malnutrition of moderate degree 02/12/2019   Closed loop SBO (small bowel obstruction)  02/01/2019   Sepsis ([place]) 02/01/2019   Chronic anticoagulation 02/01/2019   Cardiac pacemaker in place - [place] DR MRI 02/01/2019   Rectocele 02/01/2019   DM (diabetes mellitus) ([place]) 02/01/2019   Bowel obstruction ([place]) 02/01/2019   Acute focal ischemia of small intestine ([place]) 02/01/2019   Pseudophakia of left eye 04/03/2016   Sick sinus syndrome ([place]) 02/25/2016   Cancer ([place])    Shortness of breath 11/17/2011   Advanced nonexudative age-related macular degeneration of left eye without subfoveal involvement 04/10/2011   Nuclear cataract 04/10/2011   Age-related macular degeneration 12/03/2010   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 [place] [address] Phone: [phone] Fax: [place], [place]  [place] [address] Phone: [phone] Fax: [phone]     Social Determinants of Health (SDOH) Social History: [place]: No Food Insecurity (01/15/2022)  Housing: Low Risk  (01/15/2022)  Transportation Needs: No Transportation Needs (01/15/2022)  Utilities: Not At Risk (01/15/2022)  Tobacco Use: Low Risk  (04/24/2022)   SDOH Interventions:     Readmission Risk Interventions    01/16/2022    11:37 AM  Readmission Risk Prevention Plan  Transportation Screening Complete  PCP or Specialist Appt within 3-5 Days Complete  HRI or [place] Complete  Social Work Consult for [place] Planning/Counseling Complete  Palliative Care Screening Not Applicable  Medication Review Press photographer) Complete

## 2022-04-25 NOTE — TOC Initial Note (Signed)
Transition of Care [facility]) - Initial/Assessment Note    Patient Details  Name: Megan Hester MRN: [id number] Date of Birth: 10/22/1930  [facility] Care [facility]) CM/SW Contact:    Dessa Phi, RN Phone Number: 04/25/2022, 2:37 PM  Clinical Narrative:Spoke to Steve(Son) about d/c plan-home w/hospital bed-[employer] rep Jermaine to manage delivery-hospital bed in the home prior d/c.[facility] was already active-HHRN/PT/OT-rep Claiborne Billings following. Office referral for [facility] rep to discuss services w/Steve-await outcome to determine services for home vs palliative care w/current HHC.PTAR may be needed @ d/c.                   Expected Discharge Plan: [place] Barriers to Discharge: Continued Medical Work up   Patient Goals and CMS Choice Patient states their goals for this hospitalization and ongoing recovery are::  (Home) CMS Medicare.gov Compare Post Acute Care list provided to:: Patient Represenative (must comment) (Steve(Son)) Choice offered to / list presented to : Adult [place] ownership interest in [facility]provided to:: Adult Children    Expected Discharge Plan and Services   Discharge Planning Services: CM Consult   Living arrangements for the past 2 months: [place]                 DME Arranged: Hospital bed DME Agency: [employer] Date DME Agency Contacted: 04/25/22 Time DME Agency Contacted: [id number] Representative spoke with at DME Agency: Brenton Grills HH Arranged: RN, PT, OT HH Agency: [place] Date [place]: 04/25/22 Time [place]: 46 Representative spoke with at [place]: Claiborne Billings  Prior Living Arrangements/Services Living arrangements for the past 2 months: [place] Lives with:: Adult Children   Do you feel safe going back to the place where you live?: Yes          Current home services: DME (rw,3n1,rollator,lift chair, w/c)     Activities of Daily Living      Permission Sought/Granted Permission sought to share information with : Case Manager Permission granted to share information with : Yes, Verbal Permission Granted              Emotional Assessment              Admission diagnosis:  Acute blood loss anemia [D62] Symptomatic anemia [D64.9] Hematoma of right lower extremity, initial encounter [S80.11XA] Hypotension, unspecified hypotension type [I95.9] Patient Active Problem List   Diagnosis Date Noted   Symptomatic anemia 01/14/2022   MDS (myelodysplastic syndrome) ([place]) 07/02/2021   B12 deficiency 12/13/2020   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[place]) 12/13/2020   Deficiency anemia 08/28/2020   SIRS (systemic inflammatory response syndrome) ([place])    Acute blood loss anemia    Malnutrition of moderate degree 02/12/2019   Closed loop SBO (small bowel obstruction)  02/01/2019   Sepsis ([place]) 02/01/2019   Chronic anticoagulation 02/01/2019   Cardiac pacemaker in place - [place] DR MRI 02/01/2019   Rectocele 02/01/2019   DM (diabetes mellitus) ([place]) 02/01/2019   Bowel obstruction ([place]) 02/01/2019   Acute focal ischemia of small intestine ([place]) 02/01/2019   Pseudophakia of left eye 04/03/2016   Sick sinus syndrome ([place]) 02/25/2016   Cancer ([place])    Shortness of breath 11/17/2011   Advanced nonexudative age-related macular degeneration of left eye without subfoveal involvement 04/10/2011   Nuclear cataract 04/10/2011   Age-related macular degeneration 12/03/2010   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 [place] [address] Phone: [phone] Fax: [place], [place] [address] Phone: [phone] Fax: [phone]     Social Determinants of Health (SDOH) Social History: [place]: No Food Insecurity (01/15/2022)  Housing: Low Risk  (01/15/2022)  Transportation Needs: No Transportation Needs (01/15/2022)  Utilities: Not At Risk (01/15/2022)  Tobacco Use: Low Risk  (04/24/2022)   SDOH Interventions:     Readmission Risk Interventions    04/25/2022    9:34 AM 01/16/2022   11:37 AM  Readmission Risk Prevention Plan  Transportation Screening Complete Complete  PCP or Specialist Appt within 3-5 Days Complete Complete  HRI or Home Care Consult Complete Complete  Social Work Consult for [place] Planning/Counseling Complete Complete  Palliative Care Screening Not Applicable Not Applicable  Medication Review Press photographer) Complete Complete

## 2022-04-25 NOTE — Telephone Encounter (Signed)
Per Dr Julien Nordmann a referral was made for [facility] and son in agreement. [facility] will contact their hospital liaison re the referral.

## 2022-04-25 NOTE — TOC CM/SW Note (Cosign Needed)
    Durable Medical Equipment  (From admission, onward)           Start     Ordered   04/25/22 0948  For home use only DME Hospital bed  Once       Question Answer Comment  Length of Need Lifetime   The above medical condition requires: Patient requires the ability to reposition frequently   Bed type Semi-electric      04/25/22 0947

## 2022-04-26 LAB — BASIC METABOLIC PANEL
Anion gap: 7 (ref 5–15)
BUN: 21 mg/dL (ref 8–23)
CO2: 23 mmol/L (ref 22–32)
Calcium: 8.3 mg/dL — ABNORMAL LOW (ref 8.9–10.3)
Chloride: 104 mmol/L (ref 98–111)
Creatinine, Ser: 0.83 mg/dL (ref 0.44–1.00)
GFR, Estimated: >60 mL/min (ref >60)
Glucose, Bld: 95 mg/dL (ref 70–99)
Potassium: 4 mmol/L (ref 3.5–5.1)
Sodium: 134 mmol/L — ABNORMAL LOW (ref 135–145)

## 2022-04-26 LAB — HEMOGLOBIN AND HEMATOCRIT, BLOOD
HCT: 25 % — ABNORMAL LOW (ref 36.0–46.0)
Hemoglobin: 8.3 g/dL — ABNORMAL LOW (ref 12.0–15.0)

## 2022-04-26 LAB — MAGNESIUM: Magnesium: 2.1 mg/dL (ref 1.7–2.4)

## 2022-04-26 MED ORDER — OXYCODONE HCL 5 MG PO TABS: 5.0000 mg | ORAL_TABLET | ORAL | 0 refills | Status: DC | PRN

## 2022-04-26 NOTE — Progress Notes (Signed)
[facility] [address] [facility]) Hospital Liaison Note  Visited patient at bedside. Patient sleeping peacefully, did not arouse to verbal stimuli.  Discharge orders placed and PTAR called.   Please provide prescriptions at discharge as needed to ensure ongoing symptom management.   Please call with any hospice related questions or concerns.  Thank you, Margaretmary Eddy, BSN, RN [facility] Liaison  [phone]

## 2022-04-26 NOTE — TOC Transition Note (Signed)
Transition of Care [facility]) - CM/SW Discharge Note   Patient Details  Name: Megan Hester MRN: [id number] Date of Birth: [birth year]  Transition of Care [facility]) CM/SW Contact:  Dessa Phi, RN Phone Number: 04/26/2022, 10:05 AM   Clinical Narrative:   d/c home w/hospice([facility]). PTAR called. No further CM needs.    Final next level of care: Home w Hospice Care Barriers to Discharge: No Barriers Identified   Patient Goals and CMS Choice CMS Medicare.gov Compare Post Acute Care list provided to:: Patient Represenative (must comment) (Steve(Son)) Choice offered to / list presented to : Adult Children  Discharge Placement                  Patient to be transferred to facility by:  Corey Harold) Name of family member notified:  (Steve(Son)) Patient and family notified of of transfer: 04/26/22  Discharge Plan and Services Additional resources added to the After Visit Summary for     Discharge Planning Services: CM Consult            DME Arranged: Hospital bed DME Agency: [employer] Date DME Agency Contacted: 04/25/22 Time DME Agency Contacted: [phone] Representative spoke with at DME Agency: Brenton Grills HH Arranged: RN, PT, OT [facility] Agency: [place] Date [place]: 04/25/22 Time [place]: [place] Representative spoke with at [place]: [place] Determinants of Health ([place]) Interventions [place]: No Food Insecurity (01/15/2022)  Housing: Low Risk  (01/15/2022)  Transportation Needs: No Transportation Needs (01/15/2022)  Utilities: Not At Risk (01/15/2022)  Tobacco Use: Low Risk  (04/24/2022)     Readmission Risk Interventions    04/25/2022    9:34 AM 01/16/2022   11:37 AM  Readmission Risk Prevention Plan  Transportation Screening Complete Complete  PCP or Specialist Appt within 3-5 Days Complete Complete  HRI or [place] Complete Complete  Social Work Consult for [place]  Planning/Counseling Complete Complete  Palliative Care Screening Not Applicable Not Applicable  Medication Review Press photographer) Complete Complete

## 2022-04-26 NOTE — Progress Notes (Signed)
IV and telemetry monitor removed, son, Remo Lipps, called and notified of PTAR departure from unit and AVS reviewed, verbalized understanding, personal belongings with patient

## 2022-04-26 NOTE — Discharge Summary (Addendum)
Physician Discharge Summary   Megan Hester [id number] DOB: August 02, 1930 DOA: 04/24/2022  PCP: [name], MD  Admit date: 04/24/2022 Discharge date: 3/16/20243/16/2024  Admitted From: Home Disposition:  Home Discharging physician: Dwyane Dee, MD Barriers to discharge: none  Recommendations for Outpatient Follow-up:  Patient referred to home hospice at discharge   Discharge Condition: stable CODE STATUS: Full Diet recommendation:  Diet Orders (From admission, onward)     Start     Ordered   04/26/22 0000  Diet general        04/26/22 0900   04/24/22 1600  Diet regular Room service appropriate? Yes; Fluid consistency: Thin  Diet effective now       Question Answer Comment  Room service appropriate? Yes   Fluid consistency: Thin      04/24/22 1559            Hospital Course: Megan Hester is an 89 yo female with PMH low grade MDS, GERD, HTN, macular degeneration, PAF, retinal hemorrhage s/p right eye enucleation who presented originally to her oncology office for a follow up visit. She was found to be weak, lethargic, hypotensive, tachycardic, and hemoglobin returned at 4.6 g/dL. She was referred to the ER for transfusions and further workup.  She was noted to have large bruising on her right lower extremity as well.  She began having pain and swelling in her right knee approximately 3 weeks ago.  She attempted to have aspiration of the right knee outpatient with ortho but since then has had worsening ecchymoses. She was ordered 3 units PRBC in the ER.  Blood pressure was noted to be 77/52 initially.  She is admitted for hemoglobin monitoring while receiving blood transfusions and close monitoring of blood pressure. Hemoglobin responded well to 3 units PRBC given on admission.  The following morning hemoglobin had improved to 8.4 g/dL.  Hypotension had also improved and blood pressure returned to normal range.  Assessment and Plan: * Acute blood loss  anemia - Suspected multifactorial component with recent right knee attempted arthrocentesis along with ongoing anticoagulation with Xarelto and underlying baseline MDS which already requires periodic transfusions - Hemoglobin 4.6 g/dL.  3 units PRBC ordered in the ER -Hold Xarelto x 1 week approx - Hgb stable; Hgb 8.3 g/dL this morning.  MDS (myelodysplastic syndrome) ([place]) - follows with Dr. Julien Nordmann, seen 3/14 and sent to ER for severe anemia - remains on B12 injections and intermittent PRBC -Hemoglobin responded well to 3 units PRBC  DM (diabetes mellitus) ([place]) - Last A1c 5.5% on 01/15/2022 - Continue diet control  History of enucleation of right eyeball - Noted  A-fib ([place]) - Holding Xarelto in setting of severe anemia; discussed with son.  Likely will hold approximately 1 week unless right thigh bruising worsens in which case may need to be held longer - resume toprol   The patient's chronic medical conditions were treated accordingly per the patient's home medication regimen except as noted.  On day of discharge, patient was felt deemed stable for discharge. Patient/family member advised to call PCP or come back to ER if needed.   Principal Diagnosis: Acute blood loss anemia  Discharge Diagnoses: Active Hospital Problems   Diagnosis Date Noted   Acute blood loss anemia     Priority: 1.   MDS (myelodysplastic syndrome) ([place]) 07/02/2021    Priority: 1.   DM (diabetes mellitus) ([place]) 02/01/2019   History of enucleation of right eyeball 12/03/2010   A-fib ([place]) 07/16/2010    Resolved  Hospital Problems  No resolved problems to display.     Discharge Instructions     Diet general   Complete by: As directed    Increase activity slowly   Complete by: As directed    No wound care   Complete by: As directed       Allergies as of 04/26/2022       Reactions   Shellfish Allergy Hives, Swelling, Other (See Comments)   Shrimp and lobster only   Atropine Other (See  Comments)   Causes A-FIB   Codeine Other (See Comments)   Leg pain   Doxycycline Other (See Comments)   Unknown reaction   Erythromycin Other (See Comments)   Unknown reaction   Fenoprofen Calcium Swelling   Iodinated Contrast Media Hives, Other (See Comments)   Isopto Hyoscine [scopolamine]    Causes AFIB   Naproxen Other (See Comments)   Unknown reaction   Other Swelling, Other (See Comments)   Nutrasweet - Bleeding Naphon - swelling   Oxycodone Other (See Comments)   Depression - pt tolerates as needed    Penicillins    Has patient had a PCN reaction causing immediate rash, facial/tongue/throat swelling, SOB or lightheadedness with hypotension: unknown Has patient had a PCN reaction causing severe rash involving mucus membranes or skin necrosis: unknown Has patient had a PCN reaction that required hospitalization unknown Has patient had a PCN reaction occurring within the last 10 years: childhood If all of the above answers are "NO", then may proceed with Cephalosporin use. unknown   Sulfa Drugs Cross Reactors Swelling, Other (See Comments)   Ankle swelling    Warfarin Other (See Comments)   Bleeding from eyes   Ceclor [cefaclor] Hives   Levaquin [levofloxacin] Rash, Other (See Comments)   Rash & causes AFIB   Tape Rash   Use paper tape        Medication List     STOP taking these medications    traMADol 50 MG tablet Commonly known as: ULTRAM       TAKE these medications    acetaminophen 500 MG tablet Commonly known as: TYLENOL Take 500 mg by mouth every 6 (six) hours as needed for moderate pain.   amiodarone 200 MG tablet Commonly known as: PACERONE Take 200 mg by mouth daily with supper.   ARTIFICIAL TEARS OP Place 1 drop into the left eye 3 (three) times daily as needed (for dryness).   cetirizine 10 MG tablet Commonly known as: ZYRTEC Take 10 mg by mouth every evening.   clotrimazole-betamethasone cream Commonly known as: LOTRISONE Apply 1  application  topically 2 (two) times daily as needed (to affected areas).   Glucosamine HCl Powd Take 8.5 g by mouth See admin instructions. Mix 8.5 grams (0.5 scoopful) into juice and drink once a day, as tolerated   Melatonin 10 MG Tabs Take 10 mg by mouth at bedtime.   methocarbamol 500 MG tablet Commonly known as: ROBAXIN Take 500 mg by mouth every 8 (eight) hours as needed for muscle spasms.   metoprolol succinate 25 MG 24 hr tablet Commonly known as: Toprol XL Take 1 tablet (25 mg total) by mouth daily. What changed: when to take this   oxyCODONE 5 MG immediate release tablet Commonly known as: Oxy IR/ROXICODONE Take 1 tablet (5 mg total) by mouth every 4 (four) hours as needed for severe pain or breakthrough pain.   pantoprazole 40 MG tablet Commonly known as: Protonix Take 1 tablet (40 mg total)  by mouth 2 (two) times daily.   polyethylene glycol 17 g packet Commonly known as: MIRALAX / GLYCOLAX Take 8.5 g by mouth daily as needed for moderate constipation. Mix 8.5 grams (0.5 scoopful) into suggested amount of water and drink once a day- HOLD FOR DIARRHEA   PRESERVISION/LUTEIN PO Take 1 capsule by mouth 2 (two) times daily.   QUEtiapine 25 MG tablet Commonly known as: SEROQUEL Take 25 mg by mouth at bedtime.   Rivaroxaban 15 MG Tabs tablet Commonly known as: Xarelto Take 1 tablet (15 mg total) by mouth daily with supper. Start taking on: April 30, 2022 What changed:  See the new instructions. These instructions start on April 30, 2022. If you are unsure what to do until then, ask your doctor or other care provider.   sertraline 100 MG tablet Commonly known as: ZOLOFT Take 100 mg by mouth at bedtime.   SYSTANE OP Place 1 drop into the right eye daily as needed (for dryness).   Vitamin D 1000 units capsule Take 1,000 Units by mouth daily with supper.               Durable Medical Equipment  (From admission, onward)           Start     Ordered    04/25/22 0948  For home use only DME Hospital bed  Once       Question Answer Comment  Length of Need Lifetime   The above medical condition requires: Patient requires the ability to reposition frequently   Bed type Semi-electric      04/25/22 0947            Allergies  Allergen Reactions   Shellfish Allergy Hives, Swelling and Other (See Comments)    Shrimp and lobster only   Atropine Other (See Comments)    Causes A-FIB   Codeine Other (See Comments)    Leg pain   Doxycycline Other (See Comments)    Unknown reaction   Erythromycin Other (See Comments)    Unknown reaction   Fenoprofen Calcium Swelling   Iodinated Contrast Media Hives and Other (See Comments)   Isopto Hyoscine [Scopolamine]     Causes AFIB   Naproxen Other (See Comments)    Unknown reaction   Other Swelling and Other (See Comments)    Nutrasweet - Bleeding Naphon - swelling   Oxycodone Other (See Comments)    Depression - pt tolerates as needed    Penicillins     Has patient had a PCN reaction causing immediate rash, facial/tongue/throat swelling, SOB or lightheadedness with hypotension: unknown Has patient had a PCN reaction causing severe rash involving mucus membranes or skin necrosis: unknown Has patient had a PCN reaction that required hospitalization unknown Has patient had a PCN reaction occurring within the last 10 years: childhood If all of the above answers are "NO", then may proceed with Cephalosporin use. unknown   Sulfa Drugs Cross Reactors Swelling and Other (See Comments)    Ankle swelling    Warfarin Other (See Comments)    Bleeding from eyes   Ceclor [Cefaclor] Hives   Levaquin [Levofloxacin] Rash and Other (See Comments)    Rash & causes AFIB   Tape Rash    Use paper tape    Consultations:   Procedures:   Discharge Exam: BP 128/86   Pulse (!) 114   Temp 97.7 F (36.5 C) (Oral)   Resp 15   Ht 5\' 4"  (1.626 m)   Wt  59.8 kg   SpO2 99%   BMI 22.63 kg/m  Physical  Exam Constitutional:      General: She is not in acute distress.    Appearance: Normal appearance.  HENT:     Head: Normocephalic and atraumatic.     Mouth/Throat:     Mouth: Mucous membranes are moist.  Eyes:     Comments: Right eye enucleation noted  Cardiovascular:     Rate and Rhythm: Normal rate and regular rhythm.  Pulmonary:     Effort: Pulmonary effort is normal. No respiratory distress.     Breath sounds: Normal breath sounds. No wheezing.  Abdominal:     General: Bowel sounds are normal. There is no distension.     Palpations: Abdomen is soft.     Tenderness: There is no abdominal tenderness.  Musculoskeletal:        General: Swelling (Large bruising appreciated over right lateral knee and right medial thigh with associated tenderness to palpation (overall improved)) present. Normal range of motion.     Cervical back: Normal range of motion and neck supple.  Skin:    General: Skin is warm and dry.  Neurological:     Mental Status: She is alert.     Comments: No acute focal deficits  Psychiatric:        Mood and Affect: Mood normal.      The results of significant diagnostics from this hospitalization (including imaging, microbiology, ancillary and laboratory) are listed below for reference.   Microbiology: No results found for this or any previous visit (from the past 240 hour(s)).   Labs: BNP (last 3 results) Recent Labs    12/04/21 1414  BNP [id number]*   Basic Metabolic Panel: Recent Labs  Lab 04/24/22 1011 04/25/22 0528 04/26/22 0542  NA 132* 132* 134*  K 3.3* 3.2* 4.0  CL 101 104 104  CO2 18* 24 23  GLUCOSE 171* 93 95  BUN 21 21 21   CREATININE 1.02* 0.79 0.83  CALCIUM 8.5* 8.1* 8.3*  MG  --  1.9 2.1   Liver Function Tests: Recent Labs  Lab 04/24/22 1011  AST 23  ALT 14  ALKPHOS 111  BILITOT 2.0*  PROT 5.6*  ALBUMIN 3.1*   No results for input(s): "LIPASE", "AMYLASE" in the last 168 hours. No results for input(s): "AMMONIA" in the last  168 hours. CBC: Recent Labs  Lab 04/24/22 0855 04/24/22 2027 04/25/22 0528 04/25/22 1132 04/26/22 0710  WBC 7.1  --   --   --   --   NEUTROABS 5.3  --   --   --   --   HGB 4.6* 8.8* 8.5* 8.4* 8.3*  HCT 14.2* 26.1* 24.6* 24.2* 25.0*  MCV 104.4*  --   --   --   --   PLT 386  --   --   --   --    Cardiac Enzymes: No results for input(s): "CKTOTAL", "CKMB", "CKMBINDEX", "TROPONINI" in the last 168 hours. BNP: Invalid input(s): "POCBNP" CBG: No results for input(s): "GLUCAP" in the last 168 hours. D-Dimer No results for input(s): "DDIMER" in the last 72 hours. Hgb A1c No results for input(s): "HGBA1C" in the last 72 hours. Lipid Profile No results for input(s): "CHOL", "HDL", "LDLCALC", "TRIG", "CHOLHDL", "LDLDIRECT" in the last 72 hours. Thyroid function studies No results for input(s): "TSH", "T4TOTAL", "T3FREE", "THYROIDAB" in the last 72 hours.  Invalid input(s): "FREET3" Anemia work up Recent Labs    04/24/22 [place] 1,803*  TIBC 147*  IRON 139   Urinalysis    Component Value Date/Time   COLORURINE YELLOW 01/14/2022 2344   APPEARANCEUR CLEAR 01/14/2022 2344   LABSPEC 1.012 01/14/2022 2344   PHURINE 5.0 01/14/2022 2344   GLUCOSEU NEGATIVE 01/14/2022 2344   HGBUR NEGATIVE 01/14/2022 2344   BILIRUBINUR NEGATIVE 01/14/2022 2344   KETONESUR NEGATIVE 01/14/2022 2344   PROTEINUR NEGATIVE 01/14/2022 2344   UROBILINOGEN 0.2 08/23/2014 0951   NITRITE NEGATIVE 01/14/2022 2344   LEUKOCYTESUR MODERATE (A) 01/14/2022 2344   Sepsis Labs Recent Labs  Lab 04/24/22 0855  WBC 7.1   Microbiology No results found for this or any previous visit (from the past 240 hour(s)).  Procedures/Studies: VAS [place] LOWER EXTREMITY VENOUS (DVT)  Result Date: 04/20/2022  Lower Venous DVT Study Patient Name:  NIKEMA WILKERSON [facility]  Date of Exam:   04/18/2022 Medical Rec #: [id number]            Accession #:    [id number] Date of Birth: Sep 14, 1930           Patient Gender: F Patient Age:    50 years Exam Location:  [facility] Procedure:      VAS [place] LOWER EXTREMITY VENOUS (DVT) Referring Phys: Collier Salina DALLDORF --------------------------------------------------------------------------------  Indications: Pain, and Swelling. Other Indications: History of lumbar spine condition. Comparison Study: No priors. Performing Technologist: Oda Cogan RDMS, RVT  Examination Guidelines: A complete evaluation includes B-mode imaging, spectral Doppler, color Doppler, and power Doppler as needed of all accessible portions of each vessel. Bilateral testing is considered an integral part of a complete examination. Limited examinations for reoccurring indications may be performed as noted. The reflux portion of the exam is performed with the patient in reverse Trendelenburg.  +---------+---------------+---------+-----------+----------+--------------+ RIGHT    CompressibilityPhasicitySpontaneityPropertiesThrombus Aging +---------+---------------+---------+-----------+----------+--------------+ CFV      Full           Yes      Yes                                 +---------+---------------+---------+-----------+----------+--------------+ SFJ      Full                                                        +---------+---------------+---------+-----------+----------+--------------+ FV Prox  Full                                                        +---------+---------------+---------+-----------+----------+--------------+ FV Mid   Full                                                        +---------+---------------+---------+-----------+----------+--------------+ FV DistalFull                                                        +---------+---------------+---------+-----------+----------+--------------+  PFV      Full                                                        +---------+---------------+---------+-----------+----------+--------------+ POP       Full           Yes      Yes                                 +---------+---------------+---------+-----------+----------+--------------+ PTV      Full                                                        +---------+---------------+---------+-----------+----------+--------------+ PERO     Full                                                        +---------+---------------+---------+-----------+----------+--------------+   +----+---------------+---------+-----------+----------+--------------+ LEFTCompressibilityPhasicitySpontaneityPropertiesThrombus Aging +----+---------------+---------+-----------+----------+--------------+ CFV Full           Yes      Yes                                 +----+---------------+---------+-----------+----------+--------------+ SFJ Full                                                        +----+---------------+---------+-----------+----------+--------------+    Summary: RIGHT: - There is no evidence of deep vein thrombosis in the lower extremity.  - No cystic structure found in the popliteal fossa.  LEFT: - No evidence of common femoral vein obstruction.  *See table(s) above for measurements and observations. Electronically signed by Jamelle Haring on 04/20/2022 at 4:24:05 PM.    Final    DG Knee Right Port  Result Date: 04/04/2022 CLINICAL DATA:  Provided history: Swelling/pain. EXAM: PORTABLE RIGHT KNEE - 1-2 VIEW COMPARISON:  Knee radiographs 11/13/2007. FINDINGS: There is normal bony alignment. No acute fracture is identified. Mild-to-moderate medial tibiofemoral compartment joint space narrowing within associated osteophyte along the medial aspect of the joint space. Enthesophytes along the superior pole of the patella. Large suprapatellar joint effusion. IMPRESSION: 1. No evidence of acute fracture. 2. Large suprapatellar joint effusion. 3. Degenerative changes, as described Electronically Signed   By: Kellie Simmering D.O.   On:  04/04/2022 10:00   DG Hip Unilat W or Wo Pelvis 2-3 Views Right  Result Date: 04/04/2022 CLINICAL DATA:  Acute onset right knee pain.  No known injury. EXAM: DG HIP (WITH OR WITHOUT PELVIS) 4V RIGHT COMPARISON:  CTA abdomen and pelvis dated 01/14/2022 FINDINGS: There is no evidence of hip fracture or dislocation. Degenerative changes of the right hip. Large volume stool in the rectum. IMPRESSION: 1. No evidence of hip fracture or dislocation.  2. Large volume stool in the rectum. Electronically Signed   By: Darrin Nipper M.D.   On: 04/04/2022 10:00     Time coordinating discharge: Over 30 minutes    Dwyane Dee, MD  [facility] 04/26/2022, 12:50 PM

## 2022-04-26 NOTE — Plan of Care (Signed)

## 2022-04-26 NOTE — TOC Transition Note (Signed)
Transition of Care [facility]) - CM/SW Discharge Note   Patient Details  Name: Megan Hester MRN: [id number] Date of Birth: [birth year]  Transition of [facility]) CM/SW Contact:  Dessa Phi, RN Phone Number: 04/26/2022, 9:59 AM   Clinical Narrative: d/c home w/Hospice([facility]). [facility] called. No further CM needs.      Final next level of care: Home w Hospice Care Barriers to Discharge: No Barriers Identified   Patient Goals and CMS Choice CMS Medicare.gov Compare Post Acute Care list provided to:: Patient Represenative (must comment) (Steve(Son)) Choice offered to / list presented to : Adult Children  Discharge Placement                  Patient to be transferred to facility by:  Charisse Klinefelter for [facility]) Name of family member notified:  (Steve(Son)) Patient and family notified of of transfer: 04/26/22  Discharge Plan and Services Additional resources added to the After Visit Summary for     Discharge Planning Services: CM Consult            DME Arranged: Hospital bed DME Agency: [facility] Date DME Agency Contacted: 04/25/22 Time DME Agency Contacted: [phone] Representative spoke with at DME Agency: Brenton Grills HH Arranged: RN, PT, OT [facility] Agency: [place] Date [place]: 04/25/22 Time [place]: [place] Representative spoke with at [place]: [place] Determinants of Health ([place]) Interventions [place]: No Food Insecurity (01/15/2022)  Housing: Low Risk  (01/15/2022)  Transportation Needs: No Transportation Needs (01/15/2022)  Utilities: Not At Risk (01/15/2022)  Tobacco Use: Low Risk  (04/24/2022)     Readmission Risk Interventions    04/25/2022    9:34 AM 01/16/2022   11:37 AM  Readmission Risk Prevention Plan  Transportation Screening Complete Complete  PCP or Specialist Appt within 3-5 Days Complete Complete  HRI or Home Care Consult Complete Complete  Social Work Consult for [place] Planning/Counseling Complete Complete  Palliative Care Screening Not Applicable Not Applicable  Medication Review Press photographer) Complete Complete

## 2022-04-28 ENCOUNTER — Encounter: Payer: Self-pay | Admitting: Physician Assistant

## 2022-04-28 ENCOUNTER — Telehealth: Payer: Self-pay | Admitting: Medical Oncology

## 2022-04-28 ENCOUNTER — Encounter: Payer: Self-pay | Admitting: Internal Medicine

## 2022-04-28 ENCOUNTER — Other Ambulatory Visit: Payer: Medicare Other

## 2022-04-28 ENCOUNTER — Telehealth: Payer: Self-pay | Admitting: Internal Medicine

## 2022-04-28 DIAGNOSIS — Z515 Encounter for palliative care: Secondary | ICD-10-CM

## 2022-04-28 NOTE — Telephone Encounter (Signed)
Called patient per 03/18 scheduling message, spoke with patient's daughter in law. Patient will be notified.

## 2022-04-28 NOTE — Progress Notes (Signed)
COMMUNITY PALLIATIVE CARE SW NOTE  PATIENT NAME: Megan Hester DOB: [birth year] MRN: [id number]  PRIMARY CARE PROVIDER: [name], MD  RESPONSIBLE PARTY:  Acct ID - Guarantor Home Phone Work Phone Relationship Acct Type  [id number] SIMEON, [phone] Self P/F     [address], [place], [place] [address]   Initial Palliative Care Encounter/Clinical Social Work  [employer] SW completed an initial encounter with patient's son. SW provided education to him regarding palliative care services, role in patient's care, visit frequency and contact information. He provided initial consent to services and provided a status update on patient.   He advised that patient continue to receive transfusions every 3 weeks. She has arthritis and bleeding from her right knee. Patient was receiving PT, but it was discontinued as it made her back worse. Patient's appetite is fair, she is eating 3 meals, but it is only a quarter of what she normally eats. Patient has a fractured vertebrae. She ambulates with a wheelchair, but can stand and pivot with 1 person assistance.  SW scheduled a a follow-up visit with patient form 3/25@ 12pm.     Social History   Tobacco Use   Smoking status: Never   Smokeless tobacco: Never  Substance Use Topics   Alcohol use: Yes    Alcohol/week: 0.0 standard drinks of alcohol    Comment: 02/25/2016 "glass of wine/month"    CODE STATUS: To be assessed. ADVANCED DIRECTIVES: To be further assessed MOST FORM COMPLETE:  To be further assessed HOSPICE EDUCATION PROVIDED: Yes, as part of palliative care assessed.  Duration of encounter and documentation: 30 minutes.  [address] [place], [place]

## 2022-04-28 NOTE — Telephone Encounter (Signed)
Son called to report that his mom is doing well , she is mentally clear . She is going to Palliative  Care , not Hospice.  He would like Stanton Kidney to see Stuart next week . He requested a DNR form.  Living Will and HCPoA received via mychart and email . Documents put in the scan basket to go in pts chart.

## 2022-04-30 ENCOUNTER — Encounter: Payer: Self-pay | Admitting: Cardiology

## 2022-04-30 ENCOUNTER — Other Ambulatory Visit: Payer: Self-pay

## 2022-04-30 ENCOUNTER — Telehealth: Payer: Self-pay | Admitting: Medical Oncology

## 2022-04-30 ENCOUNTER — Ambulatory Visit: Payer: Medicare Other | Admitting: Internal Medicine

## 2022-04-30 ENCOUNTER — Inpatient Hospital Stay: Payer: Medicare Other

## 2022-04-30 ENCOUNTER — Other Ambulatory Visit: Payer: Medicare Other

## 2022-04-30 DIAGNOSIS — D46C Myelodysplastic syndrome with isolated del(5q) chromosomal abnormality: Secondary | ICD-10-CM | POA: Diagnosis not present

## 2022-04-30 DIAGNOSIS — D464 Refractory anemia, unspecified: Secondary | ICD-10-CM

## 2022-04-30 DIAGNOSIS — D539 Nutritional anemia, unspecified: Secondary | ICD-10-CM

## 2022-04-30 DIAGNOSIS — Z9049 Acquired absence of other specified parts of digestive tract: Secondary | ICD-10-CM | POA: Diagnosis not present

## 2022-04-30 DIAGNOSIS — E538 Deficiency of other specified B group vitamins: Secondary | ICD-10-CM | POA: Diagnosis not present

## 2022-04-30 LAB — CBC WITH DIFFERENTIAL (CANCER CENTER ONLY)
Abs Immature Granulocytes: 0.02 10*3/uL (ref 0.00–0.07)
Basophils Absolute: 0 10*3/uL (ref 0.0–0.1)
Basophils Relative: 1 %
Eosinophils Absolute: 0.3 10*3/uL (ref 0.0–0.5)
Eosinophils Relative: 6 %
HCT: 24.3 % — ABNORMAL LOW (ref 36.0–46.0)
Hemoglobin: 7.9 g/dL — ABNORMAL LOW (ref 12.0–15.0)
Immature Granulocytes: 0 %
Lymphocytes Relative: 26 %
Lymphs Abs: 1.4 10*3/uL (ref 0.7–4.0)
MCH: 32 pg (ref 26.0–34.0)
MCHC: 32.5 g/dL (ref 30.0–36.0)
MCV: 98.4 fL (ref 80.0–100.0)
Monocytes Absolute: 0.5 10*3/uL (ref 0.1–1.0)
Monocytes Relative: 9 %
Neutro Abs: 3.1 10*3/uL (ref 1.7–7.7)
Neutrophils Relative %: 58 %
Platelet Count: 185 10*3/uL (ref 150–400)
RBC: 2.47 MIL/uL — ABNORMAL LOW (ref 3.87–5.11)
RDW: 22.2 % — ABNORMAL HIGH (ref 11.5–15.5)
WBC Count: 5.2 10*3/uL (ref 4.0–10.5)
nRBC: 0.4 % — ABNORMAL HIGH (ref 0.0–0.2)

## 2022-04-30 LAB — SAMPLE TO BLOOD BANK

## 2022-04-30 MED ORDER — CYANOCOBALAMIN 1000 MCG/ML IJ SOLN
1000.0000 ug | INTRAMUSCULAR | Status: DC
  Administered 2022-04-30: 1000 ug via INTRAMUSCULAR
  Filled 2022-04-30: qty 1

## 2022-04-30 NOTE — Telephone Encounter (Signed)
CBC results shared with pt and dtr in law. She has appt next week with [facility].   She is currently under Palliative Care Services.

## 2022-05-02 ENCOUNTER — Encounter: Payer: Medicare Other | Admitting: General Practice

## 2022-05-06 ENCOUNTER — Other Ambulatory Visit: Payer: Self-pay

## 2022-05-06 ENCOUNTER — Other Ambulatory Visit: Payer: Self-pay | Admitting: Medical Oncology

## 2022-05-06 ENCOUNTER — Inpatient Hospital Stay: Payer: Medicare Other | Admitting: Internal Medicine

## 2022-05-06 ENCOUNTER — Inpatient Hospital Stay (HOSPITAL_BASED_OUTPATIENT_CLINIC_OR_DEPARTMENT_OTHER): Payer: Medicare Other

## 2022-05-06 VITALS — BP 139/59 | HR 65 | Temp 97.8°F | Resp 16 | Wt 132.3 lb

## 2022-05-06 DIAGNOSIS — D469 Myelodysplastic syndrome, unspecified: Secondary | ICD-10-CM

## 2022-05-06 DIAGNOSIS — D46C Myelodysplastic syndrome with isolated del(5q) chromosomal abnormality: Secondary | ICD-10-CM | POA: Diagnosis not present

## 2022-05-06 LAB — SAMPLE TO BLOOD BANK

## 2022-05-06 LAB — CBC WITH DIFFERENTIAL (CANCER CENTER ONLY)
Abs Immature Granulocytes: 0 10*3/uL (ref 0.00–0.07)
Basophils Absolute: 0 10*3/uL (ref 0.0–0.1)
Basophils Relative: 1 %
Eosinophils Absolute: 0.1 10*3/uL (ref 0.0–0.5)
Eosinophils Relative: 3 %
HCT: 23.3 % — ABNORMAL LOW (ref 36.0–46.0)
Hemoglobin: 7.5 g/dL — ABNORMAL LOW (ref 12.0–15.0)
Immature Granulocytes: 0 %
Lymphocytes Relative: 24 %
Lymphs Abs: 1 10*3/uL (ref 0.7–4.0)
MCH: 32.2 pg (ref 26.0–34.0)
MCHC: 32.2 g/dL (ref 30.0–36.0)
MCV: 100 fL (ref 80.0–100.0)
Monocytes Absolute: 0.3 10*3/uL (ref 0.1–1.0)
Monocytes Relative: 7 %
Neutro Abs: 2.7 10*3/uL (ref 1.7–7.7)
Neutrophils Relative %: 65 %
Platelet Count: 190 10*3/uL (ref 150–400)
RBC: 2.33 MIL/uL — ABNORMAL LOW (ref 3.87–5.11)
RDW: 23.5 % — ABNORMAL HIGH (ref 11.5–15.5)
WBC Count: 4 10*3/uL (ref 4.0–10.5)
nRBC: 0 % (ref 0.0–0.2)

## 2022-05-06 LAB — PREPARE RBC (CROSSMATCH)

## 2022-05-06 LAB — FERRITIN: Ferritin: 1803 ng/mL — ABNORMAL HIGH (ref 11–307)

## 2022-05-06 LAB — IRON AND IRON BINDING CAPACITY (CC-WL,HP ONLY)
Iron: 91 ug/dL (ref 28–170)
Saturation Ratios: 68 % — ABNORMAL HIGH (ref 10.4–31.8)
TIBC: 133 ug/dL — ABNORMAL LOW (ref 250–450)
UIBC: 42 ug/dL — ABNORMAL LOW (ref 148–442)

## 2022-05-06 NOTE — Progress Notes (Signed)
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  There was also an element of vitamin B12 deficiency after small bowel resection for an obstruction in December 2020.  PRIOR THERAPY:None  CURRENT THERAPY:  1) Vitamin B12 injection initially weekly then every 2 weeks and then monthly. 2) supportive care with transfusion on as-needed basis 3) Aranesp 300 mg subcu every 2 weeks  INTERVAL HISTORY: Megan Hester 89 y.o. female returns to the clinic today for follow-up visit accompanied by her son and her daughter-in-law.  The patient continues to complain of increasing fatigue and weakness.  She was admitted to the hospital less than 2 weeks ago with significant anemia and hemoglobin was down to 4.6.  She received several units of PRBCs transfusion.  She felt much better after going home.  There was a discussion about hospice referral but the patient and her family declined hospice because she will be unable to receive blood transfusion.  She denied having any current chest pain, shortness of breath, cough or hemoptysis.  She has no nausea, vomiting, diarrhea or constipation.  She is here today for evaluation and repeat blood work.   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  MEDICATIONS:  Current Outpatient Medications  Medication Sig Dispense Refill   acetaminophen (TYLENOL) 500 MG tablet Take 500 mg by mouth every 6 (six) hours as needed for moderate pain.     amiodarone (PACERONE) 200 MG tablet Take 200 mg by mouth daily with supper.     cetirizine (ZYRTEC) 10 MG tablet Take 10 mg by mouth every evening.     Cholecalciferol (VITAMIN D) 1000 UNITS capsule Take 1,000 Units by mouth daily with supper.     clotrimazole-betamethasone (LOTRISONE) cream Apply 1 application  topically 2 (two) times daily as needed (to affected areas).     Glucosamine HCl POWD Take 8.5 g by mouth See admin instructions. Mix 8.5 grams (0.5 scoopful) into juice and drink once a day, as tolerated     Hypromellose (ARTIFICIAL TEARS OP) Place 1 drop into the left eye 3 (three) times daily as needed (for dryness).     Melatonin 10 MG TABS Take 10 mg by mouth at bedtime.     methocarbamol (ROBAXIN) 500 MG tablet Take 500 mg by mouth every 8 (eight) hours as needed for muscle spasms.     metoprolol succinate (TOPROL XL) 25 MG 24 hr tablet Take 1 tablet (25 mg total) by mouth daily. (Patient taking differently: Take 25 mg by mouth in the morning.) 90 tablet 3   Multiple Vitamins-Minerals (PRESERVISION/LUTEIN PO) Take 1 capsule by mouth 2 (two) times daily.      oxyCODONE (  OXY IR/ROXICODONE) 5 MG immediate release tablet Take 1 tablet (5 mg total) by mouth every 4 (four) hours as needed for severe pain or breakthrough pain. 30 tablet 0   pantoprazole (PROTONIX) 40 MG tablet Take 1 tablet (40 mg total) by mouth 2 (two) times daily. 60 tablet 3   Polyethyl Glycol-Propyl Glycol (SYSTANE OP) Place 1 drop into the right eye daily as needed (for dryness).     polyethylene glycol (MIRALAX / GLYCOLAX) packet Take 8.5 g by mouth daily as needed for moderate  constipation. Mix 8.5 grams (0.5 scoopful) into suggested amount of water and drink once a day- HOLD FOR DIARRHEA     QUEtiapine (SEROQUEL) 25 MG tablet Take 25 mg by mouth at bedtime.     Rivaroxaban (XARELTO) 15 MG TABS tablet Take 1 tablet (15 mg total) by mouth daily with supper. 30 tablet 5   sertraline (ZOLOFT) 100 MG tablet Take 100 mg by mouth at bedtime.     No current facility-administered medications for this visit.   Facility-Administered Medications Ordered in Other Visits  Medication Dose Route Frequency Provider Last Rate Last Admin   cyanocobalamin (VITAMIN B12) injection 1,000 mcg  1,000 mcg Intramuscular Q30 days Curt Bears, MD   1,000 mcg at 02/24/22 [id number]    SURGICAL HISTORY:  Past Surgical History:  Procedure Laterality Date   APPLICATION OF WOUND VAC Bilateral 02/01/2019   Procedure: APPLICATION OF WOUND VAC;  Surgeon: [name], MD;  Location: [facility];  Service: General;  Laterality: Bilateral;   BIOPSY  01/17/2022   Procedure: BIOPSY;  Surgeon: Wilford Corner, MD;  Location: [facility];  Service: Gastroenterology;;   BREAST BIOPSY Right    BREAST LUMPECTOMY Right 1999   RADIATION   CARDIAC CATHETERIZATION  02/05/1999   HYPERDYNAMIC LEFT VENTRICLE WITH EF OF 75-80%   CARDIOVERSION N/A 01/22/2015   Procedure: CARDIOVERSION;  Surgeon: [name], MD;  Location: [facility];  Service: Cardiovascular;  Laterality: N/A;   CARDIOVERSION N/A 02/22/2015   Procedure: CARDIOVERSION;  Surgeon: [name], MD;  Location: [facility];  Service: Cardiovascular;  Laterality: N/A;   DILATION AND CURETTAGE OF UTERUS     ENUCLEATION Right 2012   EP IMPLANTABLE DEVICE N/A 02/25/2016   Procedure: Pacemaker Implant;  Surgeon: [name], MD;  Location: [facility];  Service: Cardiovascular;  Laterality: N/A;   ESOPHAGOGASTRODUODENOSCOPY (EGD) WITH PROPOFOL N/A 01/17/2022   Procedure: ESOPHAGOGASTRODUODENOSCOPY (EGD) WITH PROPOFOL;  Surgeon:  Wilford Corner, MD;  [facility];  Service: Gastroenterology;  Laterality: N/A;   INSERT / REPLACE / REMOVE PACEMAKER     LAPAROSCOPIC SMALL BOWEL RESECTION N/A 02/01/2019   Procedure: LAPAROSCOPIC SMALL BOWEL RESECTION WITH TAP BLOCK;  Surgeon: [name], MD;  Location: [facility];  Service: General;  Laterality: N/A;   LAPAROSCOPY N/A 02/01/2019   Procedure: LAPAROSCOPY DIAGNOSTIC;  Surgeon: [name], MD;  Location: [facility];  Service: General;  Laterality: N/A;   LYSIS OF ADHESION N/A 02/01/2019   Procedure: LYSIS OF ADHESION;  Surgeon: [name], MD;  Location: [facility];  Service: General;  Laterality: N/A;   TONSILLECTOMY      REVIEW OF SYSTEMS:  Constitutional: positive for fatigue Eyes: negative Ears, nose, mouth, throat, and face: negative Respiratory: positive for dyspnea on exertion Cardiovascular: negative Gastrointestinal: negative Genitourinary:negative Integument/breast: negative Hematologic/lymphatic: negative Musculoskeletal:negative Neurological: negative Behavioral/Psych: negative Endocrine: negative Allergic/Immunologic: negative   PHYSICAL EXAMINATION: General appearance: alert, cooperative, cachectic, fatigued, mild distress, and pale Head: Normocephalic, without obvious abnormality,  atraumatic, very pale Neck: no adenopathy, no JVD, supple, symmetrical, trachea midline, and thyroid not enlarged, symmetric, no tenderness/mass/nodules Lymph nodes: Cervical, supraclavicular, and axillary nodes normal. Resp: clear to auscultation bilaterally Back: symmetric, no curvature. ROM normal. No CVA tenderness. Cardio: regular rate and rhythm, S1, S2 normal, no murmur, click, rub or gallop GI: soft, non-tender; bowel sounds normal; no masses,  no organomegaly Extremities: extremities normal, atraumatic, no cyanosis or edema Neurologic: Alert and oriented X 3, normal strength and tone. Normal symmetric reflexes. Normal coordination and gait  ECOG  PERFORMANCE STATUS: 1 - Symptomatic but completely ambulatory  Blood pressure (!) 139/59, pulse 65, temperature 97.8 F (36.6 C), temperature source Oral, resp. rate 16, weight 132 lb 4.8 oz (60 kg), SpO2 97 %.  LABORATORY DATA: Lab Results  Component Value Date   WBC 5.2 04/30/2022   HGB 7.9 (L) 04/30/2022   HCT 24.3 (L) 04/30/2022   MCV 98.4 04/30/2022   PLT 185 04/30/2022      Chemistry      Component Value Date/Time   NA 134 (L) 04/26/2022 0542   NA 141 12/04/2021 1414   K 4.0 04/26/2022 0542   CL 104 04/26/2022 0542   CO2 23 04/26/2022 0542   BUN 21 04/26/2022 0542   BUN 19 12/04/2021 1414   CREATININE 0.83 04/26/2022 0542   CREATININE 1.02 (H) 08/28/2020 1123      Component Value Date/Time   CALCIUM 8.3 (L) 04/26/2022 0542   ALKPHOS 111 04/24/2022 1011   AST 23 04/24/2022 1011   AST 13 (L) 08/28/2020 1123   ALT 14 04/24/2022 1011   ALT 7 08/28/2020 1123   BILITOT 2.0 (H) 04/24/2022 1011   BILITOT 0.5 12/04/2021 1414   BILITOT 0.8 08/28/2020 1123       RADIOGRAPHIC STUDIES: VAS [place] LOWER EXTREMITY VENOUS (DVT)  Result Date: 04/20/2022  Lower Venous DVT Study Patient Name:  [facility]I [facility]  Date of Exam:   04/18/2022 Medical Rec #: [id number]            Accession #:    [id number] Date of Birth: [birth year]           Patient Gender: F Patient Age:   48 years Exam Location:  [facility] Procedure:      VAS [place] LOWER EXTREMITY VENOUS (DVT) Referring Phys: Collier Salina DALLDORF --------------------------------------------------------------------------------  Indications: Pain, and Swelling. Other Indications: History of lumbar spine condition. Comparison Study: No priors. Performing Technologist: Oda Cogan RDMS, RVT  Examination Guidelines: A complete evaluation includes B-mode imaging, spectral Doppler, color Doppler, and power Doppler as needed of all accessible portions of each vessel. Bilateral testing is considered an integral part of a complete examination.  Limited examinations for reoccurring indications may be performed as noted. The reflux portion of the exam is performed with the patient in reverse Trendelenburg.  +---------+---------------+---------+-----------+----------+--------------+ RIGHT    CompressibilityPhasicitySpontaneityPropertiesThrombus Aging +---------+---------------+---------+-----------+----------+--------------+ CFV      Full           Yes      Yes                                 +---------+---------------+---------+-----------+----------+--------------+ SFJ      Full                                                        +---------+---------------+---------+-----------+----------+--------------+  FV Prox  Full                                                        +---------+---------------+---------+-----------+----------+--------------+ FV Mid   Full                                                        +---------+---------------+---------+-----------+----------+--------------+ FV DistalFull                                                        +---------+---------------+---------+-----------+----------+--------------+ PFV      Full                                                        +---------+---------------+---------+-----------+----------+--------------+ POP      Full           Yes      Yes                                 +---------+---------------+---------+-----------+----------+--------------+ PTV      Full                                                        +---------+---------------+---------+-----------+----------+--------------+ PERO     Full                                                        +---------+---------------+---------+-----------+----------+--------------+   +----+---------------+---------+-----------+----------+--------------+ LEFTCompressibilityPhasicitySpontaneityPropertiesThrombus Aging  +----+---------------+---------+-----------+----------+--------------+ CFV Full           Yes      Yes                                 +----+---------------+---------+-----------+----------+--------------+ SFJ Full                                                        +----+---------------+---------+-----------+----------+--------------+    Summary: RIGHT: - There is no evidence of deep vein thrombosis in the lower extremity.  - No cystic structure found in the popliteal fossa.  LEFT: - No evidence of common femoral vein obstruction.  *See table(s) above for measurements and observations. Electronically signed by Jamelle Haring on 04/20/2022 at 4:24:05 PM.  Final     ASSESSMENT AND PLAN: This is a very pleasant 89 years old white female with recently diagnosed low-grade myelodysplastic syndrome in addition to questionable vitamin B12 deficiency.   The patient had a bone marrow biopsy and aspirate.   The cytogenetics showed 5 q. deletion. She has been on treatment with supportive care with transfusion. I had a lengthy discussion with the patient and her family today about her condition and treatment options.  I discussed with them the option of treatment with Revlimid 5 mg p.o. daily for the 5 q. deletion but because of the concern about price and toxicity they declined this option. I recommended for her treatment with Aranesp 300 mg subcutaneously every 2 weeks in addition to oral iron tablets to decrease the frequency of her blood transfusion. For the anemia, she will receive 1 unit of PRBCs transfusion either today or tomorrow. I will see her back for follow-up visit in 2 weeks for evaluation and repeat blood work. I also strongly encouraged the patient and her family to consider hospice care if the frequency of transfusion is increased. She was advised to call immediately if she has any other concerning symptoms in the interval. The patient voices understanding of current disease status  and treatment options and is in agreement with the current care plan.  All questions were answered. The patient knows to call the clinic with any problems, questions or concerns. We can certainly see the patient much sooner if necessary.   Disclaimer: This note was dictated with voice recognition software. Similar sounding words can inadvertently be transcribed and may not be corrected upon review.

## 2022-05-06 NOTE — Progress Notes (Signed)
Blood transfusion orders entered. 

## 2022-05-06 NOTE — Patient Instructions (Signed)
Myelodysplastic Syndrome Myelodysplastic syndrome (MDS) is a group of cancers that affect the blood cells. MDS starts in the bone marrow. The bone marrow is the spongy tissue inside of bones where new blood cells are made. New blood cells are also called immature cells or stem cells. The bone marrow makes: Red blood cells. These carry oxygen. White blood cells. These fight infection. Platelets. These stop bleeding. With MDS, some immature cells do not grow into adult blood cells. These cells will die before they reach maturity. Immature blood cells build up in the bone marrow and crowd out normal adult blood cells. This causes a low blood count and can affect how many healthy red blood cells, white blood cells, and platelets you have. What are the causes? This condition may be caused by: Being exposed to certain medicines or chemicals. Radiation treatment. In many cases, the cause may not be known. What increases the risk? The following factors may make you more likely to develop this condition: Being female. Being age 41 or older. Being Caucasian. Having been treated with cancer medicines or radiation. Having been exposed to tobacco smoke, pesticides, lead, mercury, or a chemical called benzene. Having a family history of MDS. Having inherited certain abnormal genes that lead to bone marrow problems. What are the signs or symptoms? Symptoms of this condition often develop slowly over time. Symptoms depend on which type of blood cells are affected. Symptoms of too few red blood cells include: Being tired all the time. Feeling out of breath. Having pale skin. Having an irregular heart rate. Symptoms of too few white blood cells include: Getting infections often. Having pain in the bones. Feeling weak. Having a fever often. Symptoms of having too few platelets include: Bruising easily. Bleeding that lasts longer than normal. Having tiny spots of bleeding under the skin  (petechiae). How is this diagnosed?     This condition is diagnosed based on your symptoms, your medical history, and a physical exam. You may also have tests, including: A complete blood count. This test counts the number of red blood cells, white blood cells, and platelets. Peripheral blood smear. This test checks the number and type of blood cells. It also checks their size and shape. Bone marrow aspiration and biopsy. Aspiration involves using a needle to remove the liquid portion of the bone marrow, and a biopsy involves removing the bone marrow tissue. The samples removed will be examined under a microscope. How is this treated? There is no cure for this condition, but treatment can relieve your symptoms and slow down the production of immature blood cells. The type of treatment that is best for you depends on the type of MDS that you have. Treatment may include: Blood transfusions. Chemotherapy. This is the use of medicines to kill cancer cells. Targeted therapy. This treatment targets specific parts of cancer cells and the area around them to block the growth and spread of the cancer. Immunotherapy or biologic therapy. This treatment helps your body boost its own disease-fighting system (immune system) and natural defenses to stop the growth and spread of cancer cells. Medicines that: Slow down the number of immature blood cells that are made. Help immature cells develop into adult cells. Make more blood cells. Treat infections (antibiotics). Bone marrow stem cell transplant. In this procedure, your stem cells will be replaced with healthy stem cells from a donor. Follow these instructions at home: Medicines Take over-the-counter and prescription medicines only as told by your health care provider. Do not  take aspirin unless your health care provider tells you to. Aspirin can make bleeding more likely. If you were prescribed antibiotics, take them as told by your health care  provider. Do not stop taking the antibiotic even if you start to feel better. Lifestyle Avoid dangerous activities that could lead to bleeding. Ask your health care provider what activities are safe for you. Wash all fruits and vegetables before eating them. Make sure meat and eggs are well cooked. Do not eat raw fish and shellfish. Wash your hands often with soap and water for at least 20 seconds. If soap and water are not available, use hand sanitizer. Do not use any products that contain nicotine or tobacco. These products include cigarettes, chewing tobacco, and vaping devices, such as e-cigarettes. If you need help quitting, ask your health care provider. General instructions Stay away from people who are sick. Talk to your health care provider before having any medical or dental procedures. Keep all follow-up visits. Your health care provider may do regular exams and lab tests to monitor your condition. This is to make sure that the treatments are working. Where to find more information American Cancer Society: www.cancer.org The [place]: www.mds-foundation.[place]: www.cancer.gov The Aplastic Anemia and MDS International Foundation: www.aamds.org Contact a health care provider if: You feel more tired than normal. You have more bruising than usual. You have trouble catching your breath. You have any symptoms of infection, such as: A fever or chills. Abnormal urinary symptoms. Cough. Body aches or a headache. Weakness or dizziness. Get help right away if: Your symptoms get worse. You have bleeding that does not stop. You have trouble breathing. You have chest pain. Summary Myelodysplastic syndrome (MDS) is a group of cancers that affect the blood cells. Symptoms of this condition often develop slowly over time. Symptoms depend on which blood cells are affected. There is no cure for this condition, but treatment can relieve your symptoms and slow down  the production of immature blood cells. The type of treatment that is best for you depends on the type of MDS that you have. Keep all follow-up visits. Your health care provider may do regular exams and lab tests to monitor your condition. This is to make sure that the treatments are working. This information is not intended to replace advice given to you by your health care provider. Make sure you discuss any questions you have with your health care provider. Document Revised: 04/23/2021 Document Reviewed: 04/23/2021 Elsevier Patient Education  [place].

## 2022-05-07 ENCOUNTER — Telehealth: Payer: Self-pay | Admitting: Internal Medicine

## 2022-05-07 NOTE — Telephone Encounter (Signed)
Scheduled per 03/26 scheduling message, patient has been called and notified.

## 2022-05-08 ENCOUNTER — Other Ambulatory Visit: Payer: Self-pay

## 2022-05-08 ENCOUNTER — Inpatient Hospital Stay: Payer: Medicare Other

## 2022-05-08 DIAGNOSIS — D469 Myelodysplastic syndrome, unspecified: Secondary | ICD-10-CM

## 2022-05-08 DIAGNOSIS — D46C Myelodysplastic syndrome with isolated del(5q) chromosomal abnormality: Secondary | ICD-10-CM | POA: Diagnosis not present

## 2022-05-08 MED ORDER — SODIUM CHLORIDE 0.9% IV SOLUTION
250.0000 mL | Freq: Once | INTRAVENOUS | Status: AC
  Administered 2022-05-08: 250 mL via INTRAVENOUS

## 2022-05-08 MED ORDER — DIPHENHYDRAMINE HCL 25 MG PO CAPS
25.0000 mg | ORAL_CAPSULE | Freq: Once | ORAL | Status: AC
  Administered 2022-05-08: 25 mg via ORAL
  Filled 2022-05-08: qty 1

## 2022-05-08 MED ORDER — ACETAMINOPHEN 325 MG PO TABS
650.0000 mg | ORAL_TABLET | Freq: Once | ORAL | Status: AC
  Administered 2022-05-08: 650 mg via ORAL
  Filled 2022-05-08: qty 2

## 2022-05-08 NOTE — Patient Instructions (Signed)
Blood Transfusion, Adult A blood transfusion is a procedure in which you receive blood or a type of blood cell (blood component) through an IV. You may need a blood transfusion when you have a low blood count, which is a low number of any blood cell. This may result from a bleeding disorder, illness, injury, or surgery. The blood may come from a donor, or you may be able to have your own blood collected and stored (autologous blood donation) before a planned surgery. The blood given in a transfusion may be made up of different blood components. You may receive: Red blood cells. These carry oxygen to the cells in the body. Platelets. These help your blood to clot. Plasma. This is the liquid part of your blood. It carries proteins and other substances throughout the body. White blood cells. These help you fight infections. If you have hemophilia or another clotting disorder, you may also receive other types of blood products. Depending on the type of blood product, this procedure may take 1-4 hours to complete. Tell a health care provider about: Any bleeding problems you have. Any previous reactions you have had during a blood transfusion. Any allergies you have. All medicines you are taking, including vitamins, herbs, eye drops, creams, and over-the-counter medicines. Any surgeries you have had. Any medical conditions you have. Whether you are pregnant or may be pregnant. What are the risks? Talk with your health care provider about risks. The most common problems include: A mild allergic reaction, such as red, swollen areas of skin (hives) and itching. Fever or chills. This may be the body's response to new blood cells received. This may occur during or up to 4 hours after the transfusion. More serious problems may include: A serious allergic reaction that causes difficulty breathing or swelling around the face and lips. Transfusion-associated circulatory overload (TACO), or too much fluid in  the lungs. This may cause breathing problems. Transfusion-related acute lung injury (TRALI), which causes breathing difficulty and low oxygen in the blood. This can occur within hours of the transfusion or several days later. Iron overload. This can happen after receiving many blood transfusions over a period of time. Infection or virus being transmitted. This is rare because donated blood is carefully tested before it is given. Hemolytic transfusion reaction. This is rare. It happens when the body's defense system (immune system)tries to attack the new blood cells. Symptoms may include fever, chills, nausea, low blood pressure, and low back or chest pain. Transfusion-associated graft-versus-host disease (TAGVHD). This is rare. It happens when donated cells attack the body's healthy tissues. What happens before the procedure? You will have a blood test to check your blood type. This test is done to know what kind of blood your body will accept and to match it to the donor blood. If you are going to have a planned surgery, you may be able to do an autologous blood donation. This may be done in case you need to have a transfusion. You will have your temperature, blood pressure, and pulse checked before the transfusion. If you have had an allergic reaction to a transfusion in the past, you may be given medicine to help prevent a reaction. This medicine may be given to you by mouth (orally) or through an IV. What happens during the procedure?  An IV will be inserted into one of your veins. The bag of blood will be attached to your IV. The blood will then enter through your vein. Your temperature, blood pressure,   and pulse will be monitored during the transfusion. This monitoring is done to detect early signs of a transfusion reaction. Tell your nurse right away if you have any of these symptoms during the transfusion: Shortness of breath or trouble breathing. Chest or back pain. Fever or  chills. Itching or hives. If you have any signs or symptoms of a reaction, your transfusion will be stopped and you may be given medicine. When the transfusion is complete, your IV will be removed. Pressure may be applied to the IV site for a few minutes. A bandage (dressing)will be applied. The procedure may vary among health care providers and hospitals. What happens after the procedure? Your temperature, blood pressure, pulse, breathing rate, and blood oxygen level will be monitored until you leave the hospital or clinic. Your blood may be tested to see how you have responded to the transfusion. You may be warmed with fluids or blankets to maintain a normal body temperature. If you receive your blood transfusion in an outpatient setting, you will be told whom to contact to report any reactions. Where to find more information Visit the [employer]: redcross.org Summary A blood transfusion is a procedure in which you receive blood or a type of blood cell (blood component) through an IV. The blood given in a transfusion may be made up of different blood components. You may receive red blood cells, platelets, plasma, or white blood cells depending on the condition treated. Your temperature, blood pressure, and pulse will be monitored before, during, and after the transfusion. After the transfusion, your blood may be tested to see how your body has responded. This information is not intended to replace advice given to you by your health care provider. Make sure you discuss any questions you have with your health care provider. Document Revised: 04/26/2021 Document Reviewed: 04/26/2021 Elsevier Patient Education  2023 [employer]  

## 2022-05-09 LAB — TYPE AND SCREEN
ABO/RH(D): O NEG
Antibody Screen: NEGATIVE
Unit division: 0

## 2022-05-09 LAB — BPAM RBC
Blood Product Expiration Date: 202403292359
ISSUE DATE / TIME: 202403280859
Unit Type and Rh: 9500

## 2022-05-14 ENCOUNTER — Other Ambulatory Visit: Payer: Self-pay

## 2022-05-14 ENCOUNTER — Inpatient Hospital Stay: Payer: Medicare Other | Attending: Internal Medicine

## 2022-05-14 ENCOUNTER — Inpatient Hospital Stay: Payer: Medicare Other

## 2022-05-14 VITALS — BP 137/67 | HR 61 | Temp 98.2°F | Resp 16

## 2022-05-14 DIAGNOSIS — E538 Deficiency of other specified B group vitamins: Secondary | ICD-10-CM

## 2022-05-14 DIAGNOSIS — D464 Refractory anemia, unspecified: Secondary | ICD-10-CM

## 2022-05-14 DIAGNOSIS — D46C Myelodysplastic syndrome with isolated del(5q) chromosomal abnormality: Secondary | ICD-10-CM | POA: Insufficient documentation

## 2022-05-14 DIAGNOSIS — D539 Nutritional anemia, unspecified: Secondary | ICD-10-CM

## 2022-05-14 DIAGNOSIS — D469 Myelodysplastic syndrome, unspecified: Secondary | ICD-10-CM

## 2022-05-14 LAB — CBC WITH DIFFERENTIAL (CANCER CENTER ONLY)
Abs Immature Granulocytes: 0.01 10*3/uL (ref 0.00–0.07)
Basophils Absolute: 0 10*3/uL (ref 0.0–0.1)
Basophils Relative: 1 %
Eosinophils Absolute: 0.2 10*3/uL (ref 0.0–0.5)
Eosinophils Relative: 6 %
HCT: 24.8 % — ABNORMAL LOW (ref 36.0–46.0)
Hemoglobin: 8.1 g/dL — ABNORMAL LOW (ref 12.0–15.0)
Immature Granulocytes: 0 %
Lymphocytes Relative: 30 %
Lymphs Abs: 1.2 10*3/uL (ref 0.7–4.0)
MCH: 32.4 pg (ref 26.0–34.0)
MCHC: 32.7 g/dL (ref 30.0–36.0)
MCV: 99.2 fL (ref 80.0–100.0)
Monocytes Absolute: 0.4 10*3/uL (ref 0.1–1.0)
Monocytes Relative: 10 %
Neutro Abs: 2.1 10*3/uL (ref 1.7–7.7)
Neutrophils Relative %: 53 %
Platelet Count: 260 10*3/uL (ref 150–400)
RBC: 2.5 MIL/uL — ABNORMAL LOW (ref 3.87–5.11)
RDW: 23.1 % — ABNORMAL HIGH (ref 11.5–15.5)
WBC Count: 3.9 10*3/uL — ABNORMAL LOW (ref 4.0–10.5)
nRBC: 0 % (ref 0.0–0.2)

## 2022-05-14 LAB — IRON AND IRON BINDING CAPACITY (CC-WL,HP ONLY)
Iron: 62 ug/dL (ref 28–170)
Saturation Ratios: 54 % — ABNORMAL HIGH (ref 10.4–31.8)
TIBC: 115 ug/dL — ABNORMAL LOW (ref 250–450)
UIBC: 53 ug/dL

## 2022-05-14 LAB — SAMPLE TO BLOOD BANK

## 2022-05-14 LAB — FERRITIN: Ferritin: 1689 ng/mL — ABNORMAL HIGH (ref 11–307)

## 2022-05-14 MED ORDER — DARBEPOETIN ALFA 300 MCG/0.6ML IJ SOSY
300.0000 ug | PREFILLED_SYRINGE | INTRAMUSCULAR | Status: DC
  Administered 2022-05-14: 300 ug via SUBCUTANEOUS
  Filled 2022-05-14: qty 0.6

## 2022-05-14 NOTE — Patient Instructions (Addendum)

## 2022-05-23 ENCOUNTER — Ambulatory Visit (HOSPITAL_COMMUNITY): Payer: Medicare Other

## 2022-05-23 ENCOUNTER — Encounter (HOSPITAL_COMMUNITY): Payer: Self-pay

## 2022-05-26 ENCOUNTER — Ambulatory Visit (INDEPENDENT_AMBULATORY_CARE_PROVIDER_SITE_OTHER): Payer: Medicare Other

## 2022-05-26 DIAGNOSIS — I495 Sick sinus syndrome: Secondary | ICD-10-CM | POA: Diagnosis not present

## 2022-05-27 LAB — CUP PACEART REMOTE DEVICE CHECK
Battery Remaining Longevity: 40 mo
Battery Voltage: 2.96 V
Brady Statistic AP VP Percent: 1.08 %
Brady Statistic AP VS Percent: 88.95 %
Brady Statistic AS VP Percent: 0.24 %
Brady Statistic AS VS Percent: 9.73 %
Brady Statistic RA Percent Paced: 87.1 %
Brady Statistic RV Percent Paced: 1.1 %
Date Time Interrogation Session: 20240415153945
Implantable Lead Connection Status: 753985
Implantable Lead Connection Status: 753985
Implantable Lead Implant Date: 20180115
Implantable Lead Implant Date: 20180115
Implantable Lead Location: 753859
Implantable Lead Location: 753860
Implantable Lead Model: 5076
Implantable Lead Model: 5076
Implantable Pulse Generator Implant Date: 20180115
Lead Channel Impedance Value: 304 Ohm
Lead Channel Impedance Value: 361 Ohm
Lead Channel Impedance Value: 399 Ohm
Lead Channel Impedance Value: 418 Ohm
Lead Channel Pacing Threshold Amplitude: 0.75 V
Lead Channel Pacing Threshold Amplitude: 1 V
Lead Channel Pacing Threshold Pulse Width: 0.4 ms
Lead Channel Pacing Threshold Pulse Width: 0.4 ms
Lead Channel Sensing Intrinsic Amplitude: 1.125 mV
Lead Channel Sensing Intrinsic Amplitude: 1.125 mV
Lead Channel Sensing Intrinsic Amplitude: 11.375 mV
Lead Channel Sensing Intrinsic Amplitude: 11.375 mV
Lead Channel Setting Pacing Amplitude: 2 V
Lead Channel Setting Pacing Amplitude: 2.5 V
Lead Channel Setting Pacing Pulse Width: 0.4 ms
Lead Channel Setting Sensing Sensitivity: 2 mV
Zone Setting Status: 755011
Zone Setting Status: 755011

## 2022-05-28 ENCOUNTER — Inpatient Hospital Stay: Payer: Medicare Other

## 2022-05-28 ENCOUNTER — Other Ambulatory Visit: Payer: Self-pay | Admitting: Medical Oncology

## 2022-05-28 ENCOUNTER — Other Ambulatory Visit: Payer: Self-pay

## 2022-05-28 ENCOUNTER — Inpatient Hospital Stay (HOSPITAL_BASED_OUTPATIENT_CLINIC_OR_DEPARTMENT_OTHER): Payer: Medicare Other | Admitting: Internal Medicine

## 2022-05-28 VITALS — BP 142/82 | HR 62 | Temp 98.1°F | Resp 15 | Wt 124.4 lb

## 2022-05-28 DIAGNOSIS — D464 Refractory anemia, unspecified: Secondary | ICD-10-CM

## 2022-05-28 DIAGNOSIS — D469 Myelodysplastic syndrome, unspecified: Secondary | ICD-10-CM | POA: Diagnosis not present

## 2022-05-28 DIAGNOSIS — D46C Myelodysplastic syndrome with isolated del(5q) chromosomal abnormality: Secondary | ICD-10-CM | POA: Diagnosis not present

## 2022-05-28 DIAGNOSIS — D539 Nutritional anemia, unspecified: Secondary | ICD-10-CM

## 2022-05-28 DIAGNOSIS — E538 Deficiency of other specified B group vitamins: Secondary | ICD-10-CM

## 2022-05-28 LAB — CBC WITH DIFFERENTIAL (CANCER CENTER ONLY)
Abs Immature Granulocytes: 0 10*3/uL (ref 0.00–0.07)
Basophils Absolute: 0 10*3/uL (ref 0.0–0.1)
Basophils Relative: 1 %
Eosinophils Absolute: 0.2 10*3/uL (ref 0.0–0.5)
Eosinophils Relative: 5 %
HCT: 23.8 % — ABNORMAL LOW (ref 36.0–46.0)
Hemoglobin: 7.7 g/dL — ABNORMAL LOW (ref 12.0–15.0)
Immature Granulocytes: 0 %
Lymphocytes Relative: 34 %
Lymphs Abs: 1.7 10*3/uL (ref 0.7–4.0)
MCH: 32.5 pg (ref 26.0–34.0)
MCHC: 32.4 g/dL (ref 30.0–36.0)
MCV: 100.4 fL — ABNORMAL HIGH (ref 80.0–100.0)
Monocytes Absolute: 0.5 10*3/uL (ref 0.1–1.0)
Monocytes Relative: 10 %
Neutro Abs: 2.6 10*3/uL (ref 1.7–7.7)
Neutrophils Relative %: 50 %
Platelet Count: 210 10*3/uL (ref 150–400)
RBC: 2.37 MIL/uL — ABNORMAL LOW (ref 3.87–5.11)
RDW: 25.1 % — ABNORMAL HIGH (ref 11.5–15.5)
WBC Count: 5.2 10*3/uL (ref 4.0–10.5)
nRBC: 0.4 % — ABNORMAL HIGH (ref 0.0–0.2)

## 2022-05-28 LAB — SAMPLE TO BLOOD BANK

## 2022-05-28 LAB — PREPARE RBC (CROSSMATCH)

## 2022-05-28 LAB — TYPE AND SCREEN
ABO/RH(D): O NEG
Antibody Screen: NEGATIVE

## 2022-05-28 MED ORDER — CYANOCOBALAMIN 1000 MCG/ML IJ SOLN
1000.0000 ug | INTRAMUSCULAR | Status: DC
  Administered 2022-05-28: 1000 ug via INTRAMUSCULAR
  Filled 2022-05-28: qty 1

## 2022-05-28 MED ORDER — DARBEPOETIN ALFA 300 MCG/0.6ML IJ SOSY
300.0000 ug | PREFILLED_SYRINGE | INTRAMUSCULAR | Status: DC
  Administered 2022-05-28: 300 ug via SUBCUTANEOUS
  Filled 2022-05-28: qty 0.6

## 2022-05-28 NOTE — Progress Notes (Signed)
Blood transfusion orders entered and released.BB notified.

## 2022-05-28 NOTE — Patient Instructions (Signed)
Darbepoetin Alfa Injection What is this medication? DARBEPOETIN ALFA (dar be POE e tin AL fa) treats low levels of red blood cells (anemia) caused by kidney disease or chemotherapy. It works by helping your body make more red blood cells, which reduces the need for blood transfusions. This medicine may be used for other purposes; ask your health care provider or pharmacist if you have questions. COMMON BRAND NAME(S): Aranesp What should I tell my care team before I take this medication? They need to know if you have any of these conditions: Blood clots Cancer Heart disease High blood pressure On dialysis Seizures Stroke An unusual or allergic reaction to darbepoetin, latex, other medications, foods, dyes, or preservatives Pregnant or trying to get pregnant Breast-feeding How should I use this medication? This medication is injected into a vein or under the skin. It is usually given by a care team in a hospital or clinic setting. It may also be given at home. If you get this medication at home, you will be taught how to prepare and give it. Use exactly as directed. Take it as directed on the prescription label at the same time every day. Keep taking it unless your care team tells you to stop. It is important that you put your used needles and syringes in a special sharps container. Do not put them in a trash can. If you do not have a sharps container, call your pharmacist or care team to get one. A special MedGuide will be given to you by the pharmacist with each prescription and refill. Be sure to read this information carefully each time. Talk to your care team about the use of this medication in children. While this medication may be used in children as young as 1 month of age for selected conditions, precautions do apply. Overdosage: If you think you have taken too much of this medicine contact a poison control center or emergency room at once. NOTE: This medicine is only for you. Do not  share this medicine with others. What if I miss a dose? If you miss a dose, take it as soon as you can. If it is almost time for your next dose, take only that dose. Do not take double or extra doses. What may interact with this medication? Epoetin alfa Methoxy polyethylene glycol-epoetin beta This list may not describe all possible interactions. Give your health care provider a list of all the medicines, herbs, non-prescription drugs, or dietary supplements you use. Also tell them if you smoke, drink alcohol, or use illegal drugs. Some items may interact with your medicine. What should I watch for while using this medication? Visit your care team for regular checks on your progress. Check your blood pressure as directed. Know what your blood pressure should be and when to contact your care team. Your condition will be monitored carefully while you are receiving this medication. You may need blood work while taking this medication. What side effects may I notice from receiving this medication? Side effects that you should report to your care team as soon as possible: Allergic reactions--skin rash, itching, hives, swelling of the face, lips, tongue, or throat Blood clot--pain, swelling, or warmth in the leg, shortness of breath, chest pain Heart attack--pain or tightness in the chest, shoulders, arms, or jaw, nausea, shortness of breath, cold or clammy skin, feeling faint or lightheaded Increase in blood pressure Rash, fever, and swollen lymph nodes Redness, blistering, peeling, or loosening of the skin, including inside the mouth Seizures   Stroke--sudden numbness or weakness of the face, arm, or leg, trouble speaking, confusion, trouble walking, loss of balance or coordination, dizziness, severe headache, change in vision Side effects that usually do not require medical attention (report to your care team if they continue or are bothersome): Cough Stomach pain Swelling of the ankles, hands, or  feet This list may not describe all possible side effects. Call your doctor for medical advice about side effects. You may report side effects to FDA at 1-800-FDA-1088. Where should I keep my medication? Keep out of the reach of children and pets. Store in a refrigerator. Do not freeze. Do not shake. Protect from light. Keep this medication in the original container until you are ready to take it. See product for storage information. Get rid of any unused medication after the expiration date. To get rid of medications that are no longer needed or have expired: Take the medication to a medication take-back program. Check with your pharmacy or law enforcement to find a location. If you cannot return the medication, ask your pharmacist or care team how to get rid of the medication safely. NOTE: This sheet is a summary. It may not cover all possible information. If you have questions about this medicine, talk to your doctor, pharmacist, or health care provider.  Vitamin B12 Deficiency Vitamin B12 deficiency means that your body does not have enough vitamin B12. The body needs this important vitamin: To make red blood cells. To make genes (DNA). To help the nerves work. If you do not have enough vitamin B12 in your body, you can have health problems, such as not having enough red blood cells in the blood (anemia). What are the causes? Not eating enough foods that contain vitamin B12. Not being able to take in (absorb) vitamin B12 from the food that you eat. Certain diseases. A condition in which the body does not make enough of a certain protein. This results in your body not taking in enough vitamin B12. Having a surgery in which part of the stomach or small intestine is taken out. Taking medicines that make it hard for the body to take in vitamin B12. These include: Heartburn medicines. Some medicines that are used to treat diabetes. What increases the risk? Being an older adult. Eating a  vegetarian or vegan diet that does not include any foods that come from animals. Not eating enough foods that contain vitamin B12 while you are pregnant. Taking certain medicines. Having alcoholism. What are the signs or symptoms? In some cases, there are no symptoms. If the condition leads to too few blood cells or nerve damage, symptoms can occur, such as: Feeling weak or tired. Not being hungry. Losing feeling (numbness) or tingling in your hands and feet. Redness and burning of the tongue. Feeling sad (depressed). Confusion or memory problems. Trouble walking. If anemia is very bad, symptoms can include: Being short of breath. Being dizzy. Having a very fast heartbeat. How is this treated? Changing the way you eat and drink, such as: Eating more foods that contain vitamin B12. Drinking little or no alcohol. Getting vitamin B12 shots. Taking vitamin B12 supplements by mouth (orally). Your doctor will tell you the dose that is best for you. Follow these instructions at home: Eating and drinking  Eat foods that come from animals and have a lot of vitamin B12 in them. These include: Meats and poultry. This includes beef, pork, chicken, turkey, and organ meats, such as liver. Seafood, such as clams, rainbow trout,   salmon, tuna, and haddock. Eggs. Dairy foods such as milk, yogurt, and cheese. Eat breakfast cereals that have vitamin B12 added to them (are fortified). Check the label. The items listed above may not be a complete list of foods and beverages you can eat and drink. Contact a dietitian for more information. Alcohol use Do not drink alcohol if: Your doctor tells you not to drink. You are pregnant, may be pregnant, or are planning to become pregnant. If you drink alcohol: Limit how much you have to: 0-1 drink a day for women. 0-2 drinks a day for men. Know how much alcohol is in your drink. In the [place] one drink equals one 12 oz bottle of beer (355 mL), one 5 oz glass  of wine (148 mL), or one 1 oz glass of hard liquor (44 mL). General instructions Get any vitamin B12 shots if told by your doctor. Take supplements only as told by your doctor. Follow the directions. Keep all follow-up visits. Contact a doctor if: Your symptoms come back. Your symptoms get worse or do not get better with treatment. Get help right away if: You have trouble breathing. You have a very fast heartbeat. You have chest pain. You get dizzy. You faint. These symptoms may be an emergency. Get help right away. Call 911. Do not wait to see if the symptoms will go away. Do not drive yourself to the hospital. Summary Vitamin B12 deficiency means that your body is not getting enough of the vitamin. In some cases, there are no symptoms of this condition. Treatment may include making a change in the way you eat and drink, getting shots, or taking supplements. Eat foods that have vitamin B12 in them. This information is not intended to replace advice given to you by your health care provider. Make sure you discuss any questions you have with your health care provider. Document Revised: 09/21/2020 Document Reviewed: 09/21/2020 Elsevier Patient Education  2023 [employer].   2023 Elsevier/Gold Standard (2021-05-07 00:00:00)  

## 2022-05-28 NOTE — Progress Notes (Signed)
[facility] Telephone:([phone]) [phone]   Fax:(336) [phone]  OFFICE PROGRESS NOTE  Geoffry Paradise, MD [address] 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  There was also an element of vitamin B12 deficiency after small bowel resection for an obstruction in December 2020.  PRIOR THERAPY:None  CURRENT THERAPY:  1) Vitamin B12 injection initially weekly then every 2 weeks and then monthly. 2) supportive care with transfusion on as-needed basis 3) Aranesp 300 mg subcu every 2 weeks  INTERVAL HISTORY: Megan Hester 89 y.o. female returns to the clinic today for follow-up visit accompanied by her daughter-in-law.  The patient is feeling a little bit better today with less fatigue and weakness.  She denied having any current shortness of breath, chest pain, cough or hemoptysis.  She has no nausea, vomiting, diarrhea or constipation.  She has no headache or visual changes.  She has no recent weight loss or night sweats.  She is tolerating her Aranesp injection fairly well.  She is here for evaluation and repeat blood work.   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  MEDICATIONS:  Current Outpatient Medications  Medication Sig Dispense  Refill   acetaminophen (TYLENOL) 500 MG tablet Take 500 mg by mouth every 6 (six) hours as needed for moderate pain.     amiodarone (PACERONE) 200 MG tablet Take 200 mg by mouth daily with supper.     cetirizine (ZYRTEC) 10 MG tablet Take 10 mg by mouth every evening.     Cholecalciferol (VITAMIN D) 1000 UNITS capsule Take 1,000 Units by mouth daily with supper.     clotrimazole-betamethasone (LOTRISONE) cream Apply 1 application  topically 2 (two) times daily as needed (to affected areas).     Glucosamine HCl POWD Take 8.5 g by mouth See admin instructions. Mix 8.5 grams (0.5 scoopful) into juice and drink once a day, as tolerated     Hypromellose (ARTIFICIAL TEARS OP) Place 1 drop into the left eye 3 (three) times daily as needed (for dryness).     Melatonin 10 MG TABS Take 10 mg by mouth at bedtime.     methocarbamol (ROBAXIN) 500 MG tablet Take 500 mg by mouth every 8 (eight) hours as needed for muscle spasms.     metoprolol succinate (TOPROL XL) 25 MG 24 hr tablet Take 1 tablet (25 mg total) by mouth daily. (Patient taking differently: Take 25 mg by mouth in the morning.) 90 tablet 3   Multiple Vitamins-Minerals (PRESERVISION/LUTEIN PO) Take 1 capsule by mouth 2 (two) times daily.      oxyCODONE (OXY IR/ROXICODONE) 5 MG immediate release tablet Take 1 tablet (5 mg total) by mouth every 4 (four) hours as needed for severe pain or breakthrough pain. 30 tablet 0   pantoprazole (PROTONIX) 40  MG tablet Take 1 tablet (40 mg total) by mouth 2 (two) times daily. 60 tablet 3   Polyethyl Glycol-Propyl Glycol (SYSTANE OP) Place 1 drop into the right eye daily as needed (for dryness).     polyethylene glycol (MIRALAX / GLYCOLAX) packet Take 8.5 g by mouth daily as needed for moderate constipation. Mix 8.5 grams (0.5 scoopful) into suggested amount of water and drink once a day- HOLD FOR DIARRHEA     QUEtiapine (SEROQUEL) 25 MG tablet Take 25 mg by mouth at bedtime.     Rivaroxaban (XARELTO) 15 MG TABS  tablet Take 1 tablet (15 mg total) by mouth daily with supper. 30 tablet 5   sertraline (ZOLOFT) 100 MG tablet Take 100 mg by mouth at bedtime.     traMADol (ULTRAM) 50 MG tablet Take 50 mg by mouth 2 (two) times daily as needed.     No current facility-administered medications for this visit.   Facility-Administered Medications Ordered in Other Visits  Medication Dose Route Frequency Provider Last Rate Last Admin   cyanocobalamin (VITAMIN B12) injection 1,000 mcg  1,000 mcg Intramuscular Q30 days Si Gaul, MD   1,000 mcg at 02/24/22 1610    SURGICAL HISTORY:  Past Surgical History:  Procedure Laterality Date   APPLICATION OF WOUND VAC Bilateral 02/01/2019   Procedure: APPLICATION OF WOUND VAC;  Surgeon: Karie Soda, MD;  Location: [facility];  Service: General;  Laterality: Bilateral;   BIOPSY  01/17/2022   Procedure: BIOPSY;  Surgeon: Charlott Rakes, MD;  Location: [facility];  Service: Gastroenterology;;   BREAST BIOPSY Right    BREAST LUMPECTOMY Right 1999   RADIATION   CARDIAC CATHETERIZATION  02/05/1999   HYPERDYNAMIC LEFT VENTRICLE WITH EF OF 75-80%   CARDIOVERSION N/A 01/22/2015   Procedure: CARDIOVERSION;  Surgeon: Lars Masson, MD;  Location: [facility] END[facility]COPY;  Service: Cardiovascular;  Laterality: N/A;   CARDIOVERSION N/A 02/22/2015   Procedure: CARDIOVERSION;  Surgeon: Lewayne Bunting, MD;  Location: [facility] ENDOSCOPY;  Service: Cardiovascular;  Laterality: N/A;   DILATION AND CURETTAGE OF UTERUS     ENUCLEATION Right 2012   EP IMPLANTABLE DEVICE N/A 02/25/2016   Procedure: Pacemaker Implant;  Surgeon: Hillis Range, MD;  Location: [facility] INVASIVE CV LAB;  Service: Cardiovascular;  Laterality: N/A;   ESOPHAGOGASTRODUODENOSCOPY (EGD) WITH PROPOFOL N/A 01/17/2022   Procedure: ESOPHAGOGASTRODUODENOSCOPY (EGD) WITH PROPOFOL;  Surgeon: Charlott Rakes, MD;  Location: [facility] ENDOSCOPY;  Service: Gastroenterology;  Laterality: N/A;   INSERT / REPLACE / REMOVE PACEMAKER      LAPAROSCOPIC SMALL BOWEL RESECTION N/A 02/01/2019   Procedure: LAPAROSCOPIC SMALL BOWEL RESECTION WITH TAP BLOCK;  Surgeon: Karie Soda, MD;  Location: [facility];  Service: General;  Laterality: N/A;   LAPAROSCOPY N/A 02/01/2019   Procedure: LAPAROSCOPY DIAGNOSTIC;  Surgeon: Karie Soda, MD;  Location: [facility];  Service: General;  Laterality: N/A;   LYSIS OF ADHESION N/A 02/01/2019   Procedure: LYSIS OF ADHESION;  Surgeon: Karie Soda, MD;  Location: [facility];  Service: General;  Laterality: N/A;   TONSILLECTOMY      REVIEW OF SYSTEMS:  A comprehensive review of systems was negative except for: Constitutional: positive for fatigue   PHYSICAL EXAMINATION: General appearance: alert, cooperative, cachectic, fatigued, mild distress, and pale Head: Normocephalic, without obvious abnormality, atraumatic, very pale Neck: no adenopathy, no JVD, supple, symmetrical, trachea midline, and thyroid not enlarged, symmetric, no tenderness/mass/nodules Lymph nodes: Cervical, supraclavicular, and axillary nodes normal. Resp: clear to auscultation bilaterally Back: symmetric, no curvature. ROM normal. No  CVA tenderness. Cardio: regular rate and rhythm, S1, S2 normal, no murmur, click, rub or gallop GI: soft, non-tender; bowel sounds normal; no masses,  no organomegaly Extremities: extremities normal, atraumatic, no cyanosis or edema  ECOG PERFORMANCE STATUS: 1 - Symptomatic but completely ambulatory  Blood pressure (!) 142/82, pulse 62, temperature 98.1 F (36.7 C), temperature source Oral, resp. rate 15, weight 124 lb 6.4 oz (56.4 kg), SpO2 98 %.  LABORATORY DATA: Lab Results  Component Value Date   WBC 5.2 05/28/2022   HGB 7.7 (L) 05/28/2022   HCT 23.8 (L) 05/28/2022   MCV 100.4 (H) 05/28/2022   PLT 210 05/28/2022      Chemistry      Component Value Date/Time   NA 134 (L) 04/26/2022 0542   NA 141 12/04/2021 1414   K 4.0 04/26/2022 0542   CL 104 04/26/2022 0542   CO2 23 04/26/2022 0542    BUN 21 04/26/2022 0542   BUN 19 12/04/2021 1414   CREATININE 0.83 04/26/2022 0542   CREATININE 1.02 (H) 08/28/2020 1123      Component Value Date/Time   CALCIUM 8.3 (L) 04/26/2022 0542   ALKPHOS 111 04/24/2022 1011   AST 23 04/24/2022 1011   AST 13 (L) 08/28/2020 1123   ALT 14 04/24/2022 1011   ALT 7 08/28/2020 1123   BILITOT 2.0 (H) 04/24/2022 1011   BILITOT 0.5 12/04/2021 1414   BILITOT 0.8 08/28/2020 1123       RADIOGRAPHIC STUDIES: CUP PACEART REMOTE DEVICE CHECK  Result Date: 05/27/2022 Scheduled remote reviewed. Normal device function.  Known PAF, longest duration 2hrs,  burden 0.5%, Xarelto, Metoprolol, and Amiodarone per prior report Hitogram peak 110-120 Next remote 91 days LA, CV[facility]   ASSESSMENT AND PLAN: This is a very pleasant 89 years old white female with recently diagnosed low-grade myelodysplastic syndrome in addition to questionable vitamin B12 deficiency.   The patient had a bone marrow biopsy and aspirate.   The cytogenetics showed 5 q. deletion. She has been on treatment with supportive care with transfusion. I discussed with them the option of treatment with Revlimid 5 mg p.o. daily for the 5 q. deletion but because of the concern about price and toxicity they declined this option. She is currently on treatment with Aranesp 300 mg subcutaneously every 2 weeks in addition to oral iron tablets to decrease the frequency of her blood transfusion. The patient is tolerating her treatment with Aranesp fairly well. Repeat CBC today showed hemoglobin of 7.7. I will arrange for the patient to receive 1 unit of PRBCs transfusion today.  She will continue her current treatment with Aranesp every 2 weeks for now. I will see her back for follow-up visit in 2 weeks for evaluation with repeat blood work. She was advised to call immediately if she has any other concerning symptoms in the interval. I also strongly encouraged the patient and her family to consider hospice care if  the frequency of transfusion is increased.  The patient voices understanding of current disease status and treatment options and is in agreement with the current care plan.  All questions were answered. The patient knows to call the clinic with any problems, questions or concerns. We can certainly see the patient much sooner if necessary.   Disclaimer: This note was dictated with voice recognition software. Similar sounding words can inadvertently be transcribed and may not be corrected upon review.

## 2022-05-29 ENCOUNTER — Inpatient Hospital Stay: Payer: Medicare Other

## 2022-05-29 ENCOUNTER — Other Ambulatory Visit: Payer: Self-pay

## 2022-05-29 DIAGNOSIS — D464 Refractory anemia, unspecified: Secondary | ICD-10-CM

## 2022-05-29 DIAGNOSIS — D46C Myelodysplastic syndrome with isolated del(5q) chromosomal abnormality: Secondary | ICD-10-CM | POA: Diagnosis not present

## 2022-05-29 LAB — TYPE AND SCREEN

## 2022-05-29 MED ORDER — SODIUM CHLORIDE 0.9% IV SOLUTION
250.0000 mL | Freq: Once | INTRAVENOUS | Status: AC
  Administered 2022-05-29: 250 mL via INTRAVENOUS

## 2022-05-29 MED ORDER — DIPHENHYDRAMINE HCL 25 MG PO CAPS
25.0000 mg | ORAL_CAPSULE | Freq: Once | ORAL | Status: AC
  Administered 2022-05-29: 25 mg via ORAL
  Filled 2022-05-29: qty 1

## 2022-05-29 MED ORDER — ACETAMINOPHEN 325 MG PO TABS
650.0000 mg | ORAL_TABLET | Freq: Once | ORAL | Status: AC
  Administered 2022-05-29: 650 mg via ORAL
  Filled 2022-05-29: qty 2

## 2022-05-29 NOTE — Patient Instructions (Signed)
Blood Transfusion, Adult, Care After The following information offers guidance on how to care for yourself after your procedure. Your health care provider may also give you more specific instructions. If you have problems or questions, contact your health care provider. What can I expect after the procedure? After the procedure, it is common to have: Bruising and soreness where the IV was inserted. A headache. Follow these instructions at home: IV insertion site care     Follow instructions from your health care provider about how to take care of your IV insertion site. Make sure you: Wash your hands with soap and water for at least 20 seconds before and after you change your bandage (dressing). If soap and water are not available, use hand sanitizer. Change your dressing as told by your health care provider. Check your IV insertion site every day for signs of infection. Check for: Redness, swelling, or pain. Bleeding from the site. Warmth. Pus or a bad smell. General instructions Take over-the-counter and prescription medicines only as told by your health care provider. Rest as told by your health care provider. Return to your normal activities as told by your health care provider. Keep all follow-up visits. Lab tests may need to be done at certain periods to recheck your blood counts. Contact a health care provider if: You have itching or red, swollen areas of skin (hives). You have a fever or chills. You have pain in the head, back, or chest. You feel anxious or you feel weak after doing your normal activities. You have redness, swelling, warmth, or pain around the IV insertion site. You have blood coming from the IV insertion site that does not stop with pressure. You have pus or a bad smell coming from your IV insertion site. If you received your blood transfusion in an outpatient setting, you will be told whom to contact to report any reactions. Get help right away if: You  have symptoms of a serious allergic or immune system reaction, including: Trouble breathing or shortness of breath. Swelling of the face, feeling flushed, or widespread rash. Dark urine or blood in the urine. Fast heartbeat. These symptoms may be an emergency. Get help right away. Call 911. Do not wait to see if the symptoms will go away. Do not drive yourself to the hospital. Summary Bruising and soreness around the IV insertion site are common. Check your IV insertion site every day for signs of infection. Rest as told by your health care provider. Return to your normal activities as told by your health care provider. Get help right away for symptoms of a serious allergic or immune system reaction to the blood transfusion. This information is not intended to replace advice given to you by your health care provider. Make sure you discuss any questions you have with your health care provider. Document Revised: 04/26/2021 Document Reviewed: 04/26/2021 Elsevier Patient Education  2023 [employer]  

## 2022-05-30 LAB — BPAM RBC
Blood Product Expiration Date: 202405062359
ISSUE DATE / TIME: 202404180913
Unit Type and Rh: 9500

## 2022-05-30 LAB — TYPE AND SCREEN: Unit division: 0

## 2022-06-01 ENCOUNTER — Other Ambulatory Visit: Payer: Self-pay | Admitting: Cardiology

## 2022-06-11 ENCOUNTER — Other Ambulatory Visit: Payer: Self-pay | Admitting: Medical Oncology

## 2022-06-11 ENCOUNTER — Other Ambulatory Visit: Payer: Self-pay | Admitting: Physician Assistant

## 2022-06-11 ENCOUNTER — Inpatient Hospital Stay: Payer: Medicare Other | Attending: Internal Medicine

## 2022-06-11 ENCOUNTER — Telehealth: Payer: Self-pay | Admitting: Internal Medicine

## 2022-06-11 ENCOUNTER — Inpatient Hospital Stay: Payer: Medicare Other

## 2022-06-11 ENCOUNTER — Other Ambulatory Visit: Payer: Self-pay

## 2022-06-11 VITALS — BP 140/67 | HR 62 | Temp 98.3°F | Resp 16

## 2022-06-11 DIAGNOSIS — D539 Nutritional anemia, unspecified: Secondary | ICD-10-CM

## 2022-06-11 DIAGNOSIS — E538 Deficiency of other specified B group vitamins: Secondary | ICD-10-CM | POA: Diagnosis present

## 2022-06-11 DIAGNOSIS — D461 Refractory anemia with ring sideroblasts: Secondary | ICD-10-CM | POA: Diagnosis present

## 2022-06-11 DIAGNOSIS — D649 Anemia, unspecified: Secondary | ICD-10-CM

## 2022-06-11 DIAGNOSIS — D469 Myelodysplastic syndrome, unspecified: Secondary | ICD-10-CM

## 2022-06-11 LAB — TYPE AND SCREEN: Antibody Screen: NEGATIVE

## 2022-06-11 LAB — CBC WITH DIFFERENTIAL (CANCER CENTER ONLY)
Abs Immature Granulocytes: 0.02 10*3/uL (ref 0.00–0.07)
Basophils Absolute: 0 10*3/uL (ref 0.0–0.1)
Basophils Relative: 1 %
Eosinophils Absolute: 0.3 10*3/uL (ref 0.0–0.5)
Eosinophils Relative: 6 %
HCT: 23.1 % — ABNORMAL LOW (ref 36.0–46.0)
Hemoglobin: 7.4 g/dL — ABNORMAL LOW (ref 12.0–15.0)
Immature Granulocytes: 1 %
Lymphocytes Relative: 30 %
Lymphs Abs: 1.3 10*3/uL (ref 0.7–4.0)
MCH: 32.9 pg (ref 26.0–34.0)
MCHC: 32 g/dL (ref 30.0–36.0)
MCV: 102.7 fL — ABNORMAL HIGH (ref 80.0–100.0)
Monocytes Absolute: 0.4 10*3/uL (ref 0.1–1.0)
Monocytes Relative: 9 %
Neutro Abs: 2.4 10*3/uL (ref 1.7–7.7)
Neutrophils Relative %: 53 %
Platelet Count: 192 10*3/uL (ref 150–400)
RBC: 2.25 MIL/uL — ABNORMAL LOW (ref 3.87–5.11)
RDW: 25.5 % — ABNORMAL HIGH (ref 11.5–15.5)
WBC Count: 4.4 10*3/uL (ref 4.0–10.5)
nRBC: 0 % (ref 0.0–0.2)

## 2022-06-11 LAB — SAMPLE TO BLOOD BANK

## 2022-06-11 LAB — PREPARE RBC (CROSSMATCH)

## 2022-06-11 MED ORDER — DARBEPOETIN ALFA 300 MCG/0.6ML IJ SOSY
300.0000 ug | PREFILLED_SYRINGE | INTRAMUSCULAR | Status: DC
  Administered 2022-06-11: 300 ug via SUBCUTANEOUS
  Filled 2022-06-11: qty 0.6

## 2022-06-11 NOTE — Telephone Encounter (Signed)
Contacted patient to scheduled appointments. Patient is aware of appointments that are scheduled.   

## 2022-06-13 ENCOUNTER — Other Ambulatory Visit: Payer: Self-pay

## 2022-06-13 ENCOUNTER — Inpatient Hospital Stay: Payer: Medicare Other

## 2022-06-13 DIAGNOSIS — D649 Anemia, unspecified: Secondary | ICD-10-CM

## 2022-06-13 DIAGNOSIS — D461 Refractory anemia with ring sideroblasts: Secondary | ICD-10-CM | POA: Diagnosis not present

## 2022-06-13 LAB — TYPE AND SCREEN

## 2022-06-13 LAB — BPAM RBC

## 2022-06-13 MED ORDER — SODIUM CHLORIDE 0.9% IV SOLUTION
250.0000 mL | Freq: Once | INTRAVENOUS | Status: AC
  Administered 2022-06-13: 250 mL via INTRAVENOUS

## 2022-06-13 MED ORDER — ACETAMINOPHEN 325 MG PO TABS
650.0000 mg | ORAL_TABLET | Freq: Once | ORAL | Status: AC
  Administered 2022-06-13: 650 mg via ORAL
  Filled 2022-06-13: qty 2

## 2022-06-13 MED ORDER — DIPHENHYDRAMINE HCL 25 MG PO CAPS
25.0000 mg | ORAL_CAPSULE | Freq: Once | ORAL | Status: AC
  Administered 2022-06-13: 25 mg via ORAL
  Filled 2022-06-13: qty 1

## 2022-06-13 NOTE — Patient Instructions (Signed)
Blood Transfusion, Adult A blood transfusion is a procedure in which you receive blood or a type of blood cell (blood component) through an IV. You may need a blood transfusion when you have a low blood count, which is a low number of any blood cell. This may result from a bleeding disorder, illness, injury, or surgery. The blood may come from a donor, or you may be able to have your own blood collected and stored (autologous blood donation) before a planned surgery. The blood given in a transfusion may be made up of different blood components. You may receive: Red blood cells. These carry oxygen to the cells in the body. Platelets. These help your blood to clot. Plasma. This is the liquid part of your blood. It carries proteins and other substances throughout the body. White blood cells. These help you fight infections. If you have hemophilia or another clotting disorder, you may also receive other types of blood products. Depending on the type of blood product, this procedure may take 1-4 hours to complete. Tell a health care provider about: Any bleeding problems you have. Any previous reactions you have had during a blood transfusion. Any allergies you have. All medicines you are taking, including vitamins, herbs, eye drops, creams, and over-the-counter medicines. Any surgeries you have had. Any medical conditions you have. Whether you are pregnant or may be pregnant. What are the risks? Talk with your health care provider about risks. The most common problems include: A mild allergic reaction, such as red, swollen areas of skin (hives) and itching. Fever or chills. This may be the body's response to new blood cells received. This may occur during or up to 4 hours after the transfusion. More serious problems may include: A serious allergic reaction that causes difficulty breathing or swelling around the face and lips. Transfusion-associated circulatory overload (TACO), or too much fluid in  the lungs. This may cause breathing problems. Transfusion-related acute lung injury (TRALI), which causes breathing difficulty and low oxygen in the blood. This can occur within hours of the transfusion or several days later. Iron overload. This can happen after receiving many blood transfusions over a period of time. Infection or virus being transmitted. This is rare because donated blood is carefully tested before it is given. Hemolytic transfusion reaction. This is rare. It happens when the body's defense system (immune system)tries to attack the new blood cells. Symptoms may include fever, chills, nausea, low blood pressure, and low back or chest pain. Transfusion-associated graft-versus-host disease (TAGVHD). This is rare. It happens when donated cells attack the body's healthy tissues. What happens before the procedure? You will have a blood test to check your blood type. This test is done to know what kind of blood your body will accept and to match it to the donor blood. If you are going to have a planned surgery, you may be able to do an autologous blood donation. This may be done in case you need to have a transfusion. You will have your temperature, blood pressure, and pulse checked before the transfusion. If you have had an allergic reaction to a transfusion in the past, you may be given medicine to help prevent a reaction. This medicine may be given to you by mouth (orally) or through an IV. What happens during the procedure?  An IV will be inserted into one of your veins. The bag of blood will be attached to your IV. The blood will then enter through your vein. Your temperature, blood pressure,   and pulse will be monitored during the transfusion. This monitoring is done to detect early signs of a transfusion reaction. Tell your nurse right away if you have any of these symptoms during the transfusion: Shortness of breath or trouble breathing. Chest or back pain. Fever or  chills. Itching or hives. If you have any signs or symptoms of a reaction, your transfusion will be stopped and you may be given medicine. When the transfusion is complete, your IV will be removed. Pressure may be applied to the IV site for a few minutes. A bandage (dressing)will be applied. The procedure may vary among health care providers and hospitals. What happens after the procedure? Your temperature, blood pressure, pulse, breathing rate, and blood oxygen level will be monitored until you leave the hospital or clinic. Your blood may be tested to see how you have responded to the transfusion. You may be warmed with fluids or blankets to maintain a normal body temperature. If you receive your blood transfusion in an outpatient setting, you will be told whom to contact to report any reactions. Where to find more information Visit the [employer]: redcross.org Summary A blood transfusion is a procedure in which you receive blood or a type of blood cell (blood component) through an IV. The blood given in a transfusion may be made up of different blood components. You may receive red blood cells, platelets, plasma, or white blood cells depending on the condition treated. Your temperature, blood pressure, and pulse will be monitored before, during, and after the transfusion. After the transfusion, your blood may be tested to see how your body has responded. This information is not intended to replace advice given to you by your health care provider. Make sure you discuss any questions you have with your health care provider. Document Revised: 04/26/2021 Document Reviewed: 04/26/2021 Elsevier Patient Education  2023 [employer]  

## 2022-06-14 LAB — TYPE AND SCREEN
ABO/RH(D): O NEG
Unit division: 0

## 2022-06-14 LAB — BPAM RBC
Blood Product Expiration Date: 202405272359
ISSUE DATE / TIME: 202405030850
Unit Type and Rh: 9500

## 2022-06-16 NOTE — Progress Notes (Unsigned)
Cardiology Office Note:    Date:  06/19/2022   ID:  Megan Hester, DOB 21-Jan-1931, MRN [id number]  PCP:  Geoffry Paradise, MD  Cardiologist:  Little Ishikawa, MD  Electrophysiologist:  Hillis Range, MD (Inactive)   Referring MD: Geoffry Paradise, MD   Chief complaint: Atrial fibrillation  History of Present Illness:    Megan Hester is a 89 y.o. female with a hx of SBO with small bowel resection 02/01/2019, paroxysmal atrial fibrillation, sick sinus syndrome status post PPM, hypertension, type 2 diabetes, myelodysplastic syndrome who presents for follow-up.  She was admitted to [facility] with SBO and underwent small bowel resection 02/01/2019.  Her postoperative course was complicated by AF with RVR.  Given her hypotension, she was initially started on amiodarone drip.  She was transitioned off amiodarone due to elevated LFTs and started on metoprolol and digoxin for rate control.  Plan on discharge on 02/16/2019 was for 2 weeks of digoxin and then restart flecainide.  She reported that when she restarted flecainide 2 weeks post discharge, she developed extensive lower extremity edema and weakness/dizziness.  Her symptoms resolved with stopping flecainide.  Most recent device check 09/07/2019 showed AF burden 12%, rates controlled.  TTE 02/08/2019 showed LVEF 50 to 55%, normal RV function, indeterminate diastolic function, no significant valvular disease.  TTE 09/19/2020 showed EF 50- 55%, grade 3 diastolic dysfunction, normal RV function, mild to moderate TR, 6 mild aortic dilatation measuring 42 mm, RVSP 36.  Since last clinic visit, reports he is doing okay.  Off Xarelto due to anemia.  She denies any chest pain, dyspnea, headedness, or syncope.  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    Past Surgical History:  Procedure Laterality Date   APPLICATION OF WOUND VAC Bilateral 02/01/2019   Procedure: APPLICATION OF WOUND VAC;  Surgeon: Karie Soda, MD;  Location: [facility] ORS;  Service: General;  Laterality: Bilateral;   BIOPSY  01/17/2022   Procedure: BIOPSY;  Surgeon: Charlott Rakes, MD;  Location: [facility];  Service: Gastroenterology;;   BREAST BIOPSY Right    BREAST LUMPECTOMY Right 1999   RADIATION   CARDIAC CATHETERIZATION  02/05/1999   HYPERDYNAMIC LEFT VENTRICLE WITH EF OF 75-80%   CARDIOVERSION N/A 01/22/2015   Procedure: CARDIOVERSION;  Surgeon: Lars Masson, MD;  Location: [facility] END[facility];  Service: Cardiovascular;  Laterality: N/A;   CARDIOVERSION N/A 02/22/2015   Procedure: CARDIOVERSION;  Surgeon: Lewayne Bunting, MD;  Location: [facility] ENDOSCOPY;  Service: Cardiovascular;  Laterality: N/A;   DILATION AND CURETTAGE OF UTERUS     ENUCLEATION Right 2012   EP IMPLANTABLE DEVICE N/A 02/25/2016   Procedure: Pacemaker Implant;  Surgeon: Hillis Range, MD;  Location: [facility] INVASIVE CV LAB;  Service: Cardiovascular;  Laterality:  N/A;   ESOPHAGOGASTRODUODENOSCOPY (EGD) WITH PROPOFOL N/A 01/17/2022   Procedure: ESOPHAGOGASTRODUODENOSCOPY (EGD) WITH PROPOFOL;  Surgeon: Charlott Rakes, MD;  Location: [facility];  Service: Gastroenterology;  Laterality: N/A;   INSERT / REPLACE / REMOVE PACEMAKER     LAPAROSCOPIC SMALL BOWEL RESECTION N/A 02/01/2019   Procedure: LAPAROSCOPIC SMALL BOWEL RESECTION WITH TAP BLOCK;  Surgeon: Karie Soda, MD;  Location: [facility];  Service:  General;  Laterality: N/A;   LAPAROSCOPY N/A 02/01/2019   Procedure: LAPAROSCOPY DIAGNOSTIC;  Surgeon: Karie Soda, MD;  Location: [facility];  Service: General;  Laterality: N/A;   LYSIS OF ADHESION N/A 02/01/2019   Procedure: LYSIS OF ADHESION;  Surgeon: Karie Soda, MD;  Location: [facility];  Service: General;  Laterality: N/A;   TONSILLECTOMY      Current Medications: Current Meds  Medication Sig   acetaminophen (TYLENOL) 500 MG tablet Take 500 mg by mouth every 6 (six) hours as needed for moderate pain.   amiodarone (PACERONE) 200 MG tablet Take 200 mg by mouth daily with supper.   cetirizine (ZYRTEC) 10 MG tablet Take 10 mg by mouth every evening.   Cholecalciferol (VITAMIN D) 1000 UNITS capsule Take 1,000 Units by mouth daily with supper.   clotrimazole-betamethasone (LOTRISONE) cream Apply 1 application  topically 2 (two) times daily as needed (to affected areas).   Glucosamine HCl POWD Take 8.5 g by mouth See admin instructions. Mix 8.5 grams (0.5 scoopful) into juice and drink once a day, as tolerated   Hypromellose (ARTIFICIAL TEARS OP) Place 1 drop into the left eye 3 (three) times daily as needed (for dryness).   Melatonin 10 MG TABS Take 10 mg by mouth at bedtime.   methocarbamol (ROBAXIN) 500 MG tablet Take 500 mg by mouth every 8 (eight) hours as needed for muscle spasms.   metoprolol succinate (TOPROL-XL) 25 MG 24 hr tablet Take 1 tablet (25 mg total) by mouth daily.   Multiple Vitamins-Minerals (PRESERVISION/LUTEIN PO) Take 1 capsule by mouth 2 (two) times daily.    oxyCODONE (OXY IR/ROXICODONE) 5 MG immediate release tablet Take 1 tablet (5 mg total) by mouth every 4 (four) hours as needed for severe pain or breakthrough pain.   Polyethyl Glycol-Propyl Glycol (SYSTANE OP) Place 1 drop into the right eye daily as needed (for dryness).   polyethylene glycol (MIRALAX / GLYCOLAX) packet Take 8.5 g by mouth daily as needed for moderate constipation. Mix 8.5 grams (0.5 scoopful)  into suggested amount of water and drink once a day- HOLD FOR DIARRHEA   QUEtiapine (SEROQUEL) 25 MG tablet Take 25 mg by mouth at bedtime.   sertraline (ZOLOFT) 100 MG tablet Take 100 mg by mouth at bedtime.   traMADol (ULTRAM) 50 MG tablet Take 50 mg by mouth 2 (two) times daily as needed.     Allergies:   Shellfish allergy, Atropine, Codeine, Doxycycline, Erythromycin, Fenoprofen calcium, Iodinated contrast media, Isopto hyoscine [scopolamine], Naproxen, Other, Oxycodone, Penicillins, Sulfa drugs cross reactors, Warfarin, Ceclor [cefaclor], Levaquin [levofloxacin], and Tape   Social History   Socioeconomic History   Marital status: Married    Spouse name: Not on file   Number of children: 3   Years of education: Not on file   Highest education level: Not on file  Occupational History    Employer: RETIRED  Tobacco Use   Smoking status: Never   Smokeless tobacco: Never  Vaping Use   Vaping Use: Never used  Substance and Sexual Activity   Alcohol use: Yes  Alcohol/week: 0.0 standard drinks of alcohol    Comment: 02/25/2016 "glass of wine/month"   Drug use: No   Sexual activity: Yes    Birth control/protection: Post-menopausal    Comment: 1st intercourse 73 yo-1 partner  Other Topics Concern   Not on file  Social History Narrative   Lives in [place].  Retired Architect.   Social Determinants of Health   Financial Resource Strain: Not on file  Food Insecurity: No Food Insecurity (01/15/2022)   Hunger Vital Sign    Worried About Running Out of Food in the Last Year: Never true    Ran Out of Food in the Last Year: Never true  Transportation Needs: No Transportation Needs (01/15/2022)   PRAPARE - Administrator, Civil Service (Medical): No    Lack of Transportation (Non-Medical): No  Physical Activity: Not on file  Stress: Not on file  Social Connections: Not on file     Family History: The patient's family history includes Diabetes in her brother;  Heart attack in her father; Heart disease in her brother; Hypertension in her father; Lung cancer in her brother; Parkinsonism in her father; Stroke in her mother.  ROS:   Please see the history of present illness.    All other systems reviewed and are negative.  EKGs/Labs/Other Studies Reviewed:    The following studies were reviewed today:  02/08/2019 Echo: 1. Left ventricular ejection fraction, by visual estimation, is 50 to  55%. The left ventricle has low normal function. Left ventricular septal  wall thickness was normal. Normal left ventricular posterior wall  thickness. There is no left ventricular  hypertrophy.   2. Left ventricular diastolic parameters are indeterminate.   3. The left ventricle has no regional wall motion abnormalities.   4. Global right ventricle has normal systolic function.The right  ventricular size is normal. No increase in right ventricular wall  thickness.   5. Left atrial size was normal.   6. Right atrial size was normal.   7. The mitral valve is normal in structure. Trivial mitral valve  regurgitation. No evidence of mitral stenosis.   8. The tricuspid valve is normal in structure.   9. The aortic valve is tricuspid. Aortic valve regurgitation is not  visualized. No evidence of aortic valve sclerosis or stenosis.  10. The pulmonic valve was normal in structure. Pulmonic valve  regurgitation is not visualized.  11. Mildly elevated pulmonary artery systolic pressure.  12. A pacer wire is visualized.  13. The inferior vena cava is normal in size with greater than 50%  respiratory variability, suggesting right atrial pressure of 3 mmHg.   EKG:  05/30/2021: Atrial paced, right bundle branch block, rate 60 02/22/21: Afib, rate 85, iRBBB 01/01/2021: Atrial fibrillation, rate 61, incomplete right bundle branch block 08/17/2020: atrial fib, rate 97 incomplete RBBB 06/21/2019: atrial fibrillation, occasional ventricular paced complexes, rate  91  Recent Labs: 12/04/2021: BNP 325.3; TSH 1.470 04/24/2022: ALT 14 04/26/2022: BUN 21; Creatinine, Ser 0.83; Magnesium 2.1; Potassium 4.0; Sodium 134 06/11/2022: Hemoglobin 7.4; Platelet Count 192  Recent Lipid Panel    Component Value Date/Time   TRIG 95 02/14/2019 0326    Physical Exam:    VS:  BP (!) 134/56 (BP Location: Right Arm, Patient Position: Sitting, Cuff Size: Normal)   Pulse 61   Ht 5\' 4"  (1.626 m)   Wt 124 lb (56.2 kg)   BMI 21.28 kg/m     Wt Readings from Last 3 Encounters:  06/19/22 124  lb (56.2 kg)  05/28/22 124 lb 6.4 oz (56.4 kg)  05/06/22 132 lb 4.8 oz (60 kg)    GEN:  in no acute distress HEENT: Normal NECK: No JVD CARDIAC: RRR, no murmurs RESPIRATORY:  Clear to auscultation without rales, wheezing or rhonchi  ABDOMEN: Soft, non-tender MUSCULOSKELETAL:  1+ RLE edema SKIN: Warm and dry NEUROLOGIC:  Alert and oriented x 3 PSYCHIATRIC:  Normal affect   ASSESSMENT:    1. PAF (paroxysmal atrial fibrillation) (HCC)   2. DOE (dyspnea on exertion)   3. Leg edema     PLAN:    Atrial fibrillation: Follows with EP.  Echo 02/08/2019 showed EF 50-55% -Continue Toprol-XL 25 mg daily -Anticoagulation discontinued due to ongoing issues with anemia -Continue amiodarone 200 mg daily.  Check CMET, TSH.  Recent device interrogation shows AF burden down to 0.5%.  Dyspnea: Reports dyspnea with minimal exertion.  Appears euvolemic on exam.  TTE 09/19/2020 showed EF 50- 55%, grade 3 diastolic dysfunction, normal RV function, mild to moderate TR, 6 mild aortic dilatation measuring 42 mm, RVSP 36.  Anemia likely contributing, follows with hematology, underwent bone marrow biopsy and found to have myelodysplastic syndrome.  She has been undergoing PRBC transfusions  Lower extremity edema: She reports recent edema, R>L.  1+ on exam today.  Lower extremity duplex 04/2022 negative for DVT.  Encouraged continued use of compression stockings.  Check CMET, BNP  Anemia: Due to  myelodysplastic syndrome.  Follows with hematology, has been receiving PRBC transfusions   SSS: s/p PPM: Follows with EP   RTC in 6 months    Medication Adjustments/Labs and Tests Ordered: Current medicines are reviewed at length with the patient today.  Concerns regarding medicines are outlined above.  Orders Placed This Encounter  Procedures   Comprehensive metabolic panel   Brain natriuretic peptide   TSH   EKG 12-Lead     No orders of the defined types were placed in this encounter.    Patient Instructions  Medication Instructions:  Your physician recommends that you continue on your current medications as directed. Please refer to the Current Medication list given to you today.  *If you need a refill on your cardiac medications before your next appointment, please call your pharmacy*  Lab Work: CMET, BNP, TSH next week  If you have labs (blood work) drawn today and your tests are completely normal, you will receive your results only by: MyChart Message (if you have MyChart) OR A paper copy in the mail If you have any lab test that is abnormal or we need to change your treatment, we will call you to review the results.   Follow-Up: At [facility], you and your health needs are our priority.  As part of our continuing mission to provide you with exceptional heart care, we have created designated Provider Care Teams.  These Care Teams include your primary Cardiologist (physician) and Advanced Practice Providers (APPs -  Physician Assistants and Nurse Practitioners) who all work together to provide you with the care you need, when you need it.  We recommend signing up for the patient portal called "MyChart".  Sign up information is provided on this After Visit Summary.  MyChart is used to connect with patients for Virtual Visits (Telemedicine).  Patients are able to view lab/test results, encounter notes, upcoming appointments, etc.  Non-urgent messages can be sent  to your provider as well.   To learn more about what you can do with MyChart, go to [email or web address].  Your next appointment:   6 month(s)  Provider:   Little Ishikawa, MD            Signed, Little Ishikawa, MD  06/19/2022 1:30 PM    [facility]

## 2022-06-19 ENCOUNTER — Ambulatory Visit: Payer: Medicare Other | Attending: Cardiology | Admitting: Cardiology

## 2022-06-19 ENCOUNTER — Encounter: Payer: Self-pay | Admitting: Cardiology

## 2022-06-19 VITALS — BP 134/56 | HR 61 | Ht 64.0 in | Wt 124.0 lb

## 2022-06-19 DIAGNOSIS — I480 Paroxysmal atrial fibrillation: Secondary | ICD-10-CM | POA: Diagnosis not present

## 2022-06-19 DIAGNOSIS — R600 Localized edema: Secondary | ICD-10-CM | POA: Diagnosis not present

## 2022-06-19 DIAGNOSIS — R0609 Other forms of dyspnea: Secondary | ICD-10-CM | POA: Diagnosis not present

## 2022-06-19 NOTE — Patient Instructions (Signed)
Medication Instructions:  Your physician recommends that you continue on your current medications as directed. Please refer to the Current Medication list given to you today.  *If you need a refill on your cardiac medications before your next appointment, please call your pharmacy*  Lab Work: CMET, BNP, TSH next week  If you have labs (blood work) drawn today and your tests are completely normal, you will receive your results only by: MyChart Message (if you have MyChart) OR A paper copy in the mail If you have any lab test that is abnormal or we need to change your treatment, we will call you to review the results.   Follow-Up: At [facility], you and your health needs are our priority.  As part of our continuing mission to provide you with exceptional heart care, we have created designated Provider Care Teams.  These Care Teams include your primary Cardiologist (physician) and Advanced Practice Providers (APPs -  Physician Assistants and Nurse Practitioners) who all work together to provide you with the care you need, when you need it.  We recommend signing up for the patient portal called "MyChart".  Sign up information is provided on this After Visit Summary.  MyChart is used to connect with patients for Virtual Visits (Telemedicine).  Patients are able to view lab/test results, encounter notes, upcoming appointments, etc.  Non-urgent messages can be sent to your provider as well.   To learn more about what you can do with MyChart, go to [email or web address].    Your next appointment:   6 month(s)  Provider:   Little Ishikawa, MD

## 2022-06-25 ENCOUNTER — Ambulatory Visit: Payer: Medicare Other

## 2022-06-25 ENCOUNTER — Ambulatory Visit: Payer: Medicare Other | Admitting: Physician Assistant

## 2022-06-25 ENCOUNTER — Other Ambulatory Visit: Payer: Self-pay | Admitting: Medical Oncology

## 2022-06-25 ENCOUNTER — Inpatient Hospital Stay: Payer: Medicare Other

## 2022-06-25 ENCOUNTER — Other Ambulatory Visit: Payer: Self-pay

## 2022-06-25 ENCOUNTER — Other Ambulatory Visit: Payer: Medicare Other

## 2022-06-25 VITALS — BP 149/54 | HR 63 | Temp 98.1°F | Resp 18

## 2022-06-25 DIAGNOSIS — D464 Refractory anemia, unspecified: Secondary | ICD-10-CM

## 2022-06-25 DIAGNOSIS — D539 Nutritional anemia, unspecified: Secondary | ICD-10-CM

## 2022-06-25 DIAGNOSIS — D649 Anemia, unspecified: Secondary | ICD-10-CM

## 2022-06-25 DIAGNOSIS — E538 Deficiency of other specified B group vitamins: Secondary | ICD-10-CM

## 2022-06-25 DIAGNOSIS — D461 Refractory anemia with ring sideroblasts: Secondary | ICD-10-CM | POA: Diagnosis not present

## 2022-06-25 LAB — TYPE AND SCREEN
Antibody Screen: NEGATIVE
Unit division: 0

## 2022-06-25 LAB — CBC WITH DIFFERENTIAL (CANCER CENTER ONLY)
Abs Immature Granulocytes: 0.01 10*3/uL (ref 0.00–0.07)
Basophils Absolute: 0 10*3/uL (ref 0.0–0.1)
Basophils Relative: 1 %
Eosinophils Absolute: 0.3 10*3/uL (ref 0.0–0.5)
Eosinophils Relative: 7 %
HCT: 24 % — ABNORMAL LOW (ref 36.0–46.0)
Hemoglobin: 7.8 g/dL — ABNORMAL LOW (ref 12.0–15.0)
Immature Granulocytes: 0 %
Lymphocytes Relative: 34 %
Lymphs Abs: 1.3 10*3/uL (ref 0.7–4.0)
MCH: 33.2 pg (ref 26.0–34.0)
MCHC: 32.5 g/dL (ref 30.0–36.0)
MCV: 102.1 fL — ABNORMAL HIGH (ref 80.0–100.0)
Monocytes Absolute: 0.3 10*3/uL (ref 0.1–1.0)
Monocytes Relative: 8 %
Neutro Abs: 1.9 10*3/uL (ref 1.7–7.7)
Neutrophils Relative %: 50 %
Platelet Count: 194 10*3/uL (ref 150–400)
RBC: 2.35 MIL/uL — ABNORMAL LOW (ref 3.87–5.11)
RDW: 25.6 % — ABNORMAL HIGH (ref 11.5–15.5)
WBC Count: 3.8 10*3/uL — ABNORMAL LOW (ref 4.0–10.5)
nRBC: 0 % (ref 0.0–0.2)

## 2022-06-25 LAB — SAMPLE TO BLOOD BANK

## 2022-06-25 MED ORDER — CYANOCOBALAMIN 1000 MCG/ML IJ SOLN
1000.0000 ug | INTRAMUSCULAR | Status: DC
  Administered 2022-06-25: 1000 ug via INTRAMUSCULAR
  Filled 2022-06-25: qty 1

## 2022-06-25 MED ORDER — DARBEPOETIN ALFA 300 MCG/0.6ML IJ SOSY
300.0000 ug | PREFILLED_SYRINGE | INTRAMUSCULAR | Status: DC
  Administered 2022-06-25: 300 ug via SUBCUTANEOUS
  Filled 2022-06-25: qty 0.6

## 2022-06-25 NOTE — Progress Notes (Signed)
Blood bank

## 2022-06-27 ENCOUNTER — Inpatient Hospital Stay: Payer: Medicare Other

## 2022-06-27 ENCOUNTER — Other Ambulatory Visit: Payer: Self-pay

## 2022-06-27 DIAGNOSIS — D461 Refractory anemia with ring sideroblasts: Secondary | ICD-10-CM | POA: Diagnosis not present

## 2022-06-27 DIAGNOSIS — D649 Anemia, unspecified: Secondary | ICD-10-CM

## 2022-06-27 LAB — TYPE AND SCREEN: ABO/RH(D): O NEG

## 2022-06-27 MED ORDER — ACETAMINOPHEN 325 MG PO TABS
650.0000 mg | ORAL_TABLET | Freq: Once | ORAL | Status: AC
  Administered 2022-06-27: 650 mg via ORAL
  Filled 2022-06-27: qty 2

## 2022-06-27 MED ORDER — DIPHENHYDRAMINE HCL 25 MG PO CAPS
25.0000 mg | ORAL_CAPSULE | Freq: Once | ORAL | Status: AC
  Administered 2022-06-27: 25 mg via ORAL
  Filled 2022-06-27: qty 1

## 2022-06-27 MED ORDER — SODIUM CHLORIDE 0.9% IV SOLUTION
250.0000 mL | Freq: Once | INTRAVENOUS | Status: AC
  Administered 2022-06-27: 250 mL via INTRAVENOUS

## 2022-06-27 NOTE — Progress Notes (Unsigned)
[facility] OFFICE PROGRESS NOTE  Geoffry Paradise, MD [address] [place] [address]  DIAGNOSIS:  Low-grade myelodysplastic syndrome with ring sideroblasts.  There was also an element of vitamin B12 deficiency after small bowel resection for an obstruction in December 2020.   PRIOR THERAPY: None  CURRENT THERAPY:  1) Vitamin B12 injection initially weekly then every 2 weeks and then monthly. Last dose 06/22/22 2) Aranesp 300 mcg every 2 weeks 3) supportive care with transfusion on as-needed basis   INTERVAL HISTORY: Megan Hester 89 y.o. female returns to the clinic today for a follow-up visit accompanied by her daughter in law.  The patient was last seen in clinic by Dr. Arbutus Ped on 14/17/24. She is currently receiving aranesp injections every 2 weeks. Her most recent blood transfusion was on 06/25/22.    Essentially, she is requiring blood transfusions on average, every other week. Overall, she feels fair today. Her energy and weakness is a little better today and she has been more active at home. She is not having as much DOE and lightheadedness at this time. When she is very anemic, she gets more fatigue, weakness, dyspnea on exertion, and lightheadedness.   She is no longer on Xarelto. They deny any abnormal bleeding or bruising. She may have a mild self limiting nose bleed; however, the bleeding is mild. She denies blood in the stool. She is here today for evaluation repeat blood work.   Fatigue and weakness.  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  MEDICATIONS:  Current Outpatient Medications  Medication Sig Dispense Refill   acetaminophen (TYLENOL) 500 MG tablet Take 500 mg by mouth every 6 (six) hours as needed for moderate pain.     amiodarone (PACERONE) 200 MG tablet Take 200 mg by mouth daily with supper.     cetirizine (ZYRTEC) 10 MG tablet Take 10 mg by mouth every evening.     Cholecalciferol (VITAMIN D) 1000 UNITS capsule Take 1,000 Units by mouth daily with supper.     clotrimazole-betamethasone (LOTRISONE) cream Apply 1 application  topically 2 (two) times daily as needed (to affected areas).     Glucosamine HCl POWD Take 8.5 g by mouth See admin instructions. Mix 8.5 grams (0.5 scoopful) into juice and drink once a day, as tolerated     Hypromellose (ARTIFICIAL TEARS OP) Place 1 drop into the left eye 3 (three) times daily as needed (for dryness).     Melatonin 10 MG TABS Take 10 mg by mouth at bedtime.     methocarbamol (ROBAXIN) 500 MG tablet Take 500 mg by mouth every 8 (eight) hours as needed for muscle spasms.     metoprolol succinate (TOPROL-XL) 25 MG 24 hr tablet Take 1 tablet (25 mg total) by mouth daily. 90 tablet 3   Multiple Vitamins-Minerals (PRESERVISION/LUTEIN PO) Take 1 capsule  by mouth 2 (two) times daily.      oxyCODONE (OXY IR/ROXICODONE) 5 MG immediate release tablet Take 1 tablet (5 mg total) by mouth every 4 (four) hours as needed for severe pain or breakthrough pain. 30 tablet 0   pantoprazole (PROTONIX) 40 MG tablet Take 1 tablet (40 mg total) by mouth 2 (two) times daily. (Patient not taking: Reported on 06/19/2022) 60 tablet 3   Polyethyl Glycol-Propyl Glycol (SYSTANE OP) Place 1 drop into the right eye daily as needed (for dryness).     polyethylene  glycol (MIRALAX / GLYCOLAX) packet Take 8.5 g by mouth daily as needed for moderate constipation. Mix 8.5 grams (0.5 scoopful) into suggested amount of water and drink once a day- HOLD FOR DIARRHEA     QUEtiapine (SEROQUEL) 25 MG tablet Take 25 mg by mouth at bedtime.     Rivaroxaban (XARELTO) 15 MG TABS tablet Take 1 tablet (15 mg total) by mouth daily with supper. (Patient not taking: Reported on 06/19/2022) 30 tablet 5   sertraline (ZOLOFT) 100 MG tablet Take 100 mg by mouth at bedtime.     traMADol (ULTRAM) 50 MG tablet Take 50 mg by mouth 2 (two) times daily as needed.     No current facility-administered medications for this visit.   Facility-Administered Medications Ordered in Other Visits  Medication Dose Route Frequency Provider Last Rate Last Admin   cyanocobalamin (VITAMIN B12) injection 1,000 mcg  1,000 mcg Intramuscular Q30 days Si Gaul, MD   1,000 mcg at 02/24/22 1914    SURGICAL HISTORY:  Past Surgical History:  Procedure Laterality Date   APPLICATION OF WOUND VAC Bilateral 02/01/2019   Procedure: APPLICATION OF WOUND VAC;  Surgeon: Karie Soda, MD;  Location: [facility];  Service: General;  Laterality: Bilateral;   BIOPSY  01/17/2022   Procedure: BIOPSY;  Surgeon: Charlott Rakes, MD;  Location: [facility];  Service: Gastroenterology;;   BREAST BIOPSY Right    BREAST LUMPECTOMY Right 1999   RADIATION   CARDIAC CATHETERIZATION  02/05/1999   HYPERDYNAMIC LEFT VENTRICLE WITH EF OF 75-80%   CARDIOVERSION N/A 01/22/2015   Procedure: CARDIOVERSION;  Surgeon: Lars Masson, MD;  Location: [facility] ENDOSCOPY;  Service: Cardiovascular;  Laterality: N/A;   CARDIOVERSION N/A 02/22/2015   Procedure: CARDIOVERSION;  Surgeon: Lewayne Bunting, MD;  Location: [facility] ENDOSCOPY;  Service: Cardiovascular;  Laterality: N/A;   DILATION AND CURETTAGE OF UTERUS     ENUCLEATION Right 2012   EP IMPLANTABLE DEVICE N/A 02/25/2016   Procedure: Pacemaker Implant;  Surgeon: Hillis Range, MD;   Location: [facility] INVASIVE CV LAB;  Service: Cardiovascular;  Laterality: N/A;   ESOPHAGOGASTRODUODENOSCOPY (EGD) WITH PROPOFOL N/A 01/17/2022   Procedure: ESOPHAGOGASTRODUODENOSCOPY (EGD) WITH PROPOFOL;  Surgeon: Charlott Rakes, MD;  Location: [facility];  Service: Gastroenterology;  Laterality: N/A;   INSERT / REPLACE / REMOVE PACEMAKER     LAPAROSCOPIC SMALL BOWEL RESECTION N/A 02/01/2019   Procedure: LAPAROSCOPIC SMALL BOWEL RESECTION WITH TAP BLOCK;  Surgeon: Karie Soda, MD;  Location: [facility];  Service: General;  Laterality: N/A;   LAPAROSCOPY N/A 02/01/2019   Procedure: LAPAROSCOPY DIAGNOSTIC;  Surgeon: Karie Soda, MD;  Location: [facility];  Service: General;  Laterality: N/A;   LYSIS OF ADHESION N/A 02/01/2019   Procedure: LYSIS OF ADHESION;  Surgeon: Karie Soda, MD;  Location: [facility];  Service: General;  Laterality: N/A;   TONSILLECTOMY      REVIEW OF SYSTEMS:   Review of Systems  Constitutional: Positive for stable/slight improved fatigue and  generalized weakness. Negative for appetite change, chills,  fever and unexpected weight change.  HENT: Positive for intermittent mild nosebleed. Negative for mouth sores, nosebleeds, sore throat and trouble swallowing.   Eyes: Negative for eye problems and icterus.  Respiratory: Positive for stable/improved dyspnea on exertion. Negative for cough, hemoptysis, and wheezing.   Cardiovascular: Negative for chest pain and leg swelling.  Gastrointestinal: Negative for abdominal pain, constipation, diarrhea, nausea and vomiting.  Genitourinary: Negative for bladder incontinence, difficulty urinating, dysuria, frequency and hematuria.   Musculoskeletal: Negative for back pain, gait problem, neck pain and neck stiffness.  Skin: Negative for itching and rash.  Neurological: Negative for dizziness, extremity weakness, gait problem, headaches, light-headedness (improved at this time) and seizures.  Hematological: Negative for adenopathy. Does not  bruise/bleed easily.  Psychiatric/Behavioral: Negative for confusion, depression and sleep disturbance. The patient is not nervous/anxious.     PHYSICAL EXAMINATION:  There were no vitals taken for this visit.  ECOG PERFORMANCE STATUS: 3  Physical Exam  Constitutional: Oriented to person, place, and time and elderly appearing female and in no distress.  HENT:  Head: Normocephalic and atraumatic.  Mouth/Throat: Oropharynx is clear and moist. No oropharyngeal exudate.  Eyes: Conjunctivae are normal. Right eye exhibits no discharge. Left eye exhibits no discharge. No scleral icterus.  Neck: Normal range of motion. Neck supple.  Cardiovascular: Normal rate, regular rhythm, normal heart sounds and intact distal pulses.   Pulmonary/Chest: Effort normal and breath sounds normal. No respiratory distress. No wheezes. No rales.  Abdominal: Soft. Bowel sounds are normal. Exhibits no distension and no mass. There is no tenderness.  Musculoskeletal: Normal range of motion. Exhibits no edema.  Lymphadenopathy:    No cervical adenopathy.  Neurological: Alert and oriented to person, place, and time. Exhibits muscle wasting. She was examined in the wheelchair.  Skin: Positive for pallor. Skin is warm and dry. No rash noted. Not diaphoretic. No erythema.  Psychiatric: Mood, memory and judgment normal.  Vitals reviewed.  LABORATORY DATA: Lab Results  Component Value Date   WBC 3.8 (L) 06/25/2022   HGB 7.8 (L) 06/25/2022   HCT 24.0 (L) 06/25/2022   MCV 102.1 (H) 06/25/2022   PLT 194 06/25/2022      Chemistry      Component Value Date/Time   NA 134 (L) 04/26/2022 0542   NA 141 12/04/2021 1414   K 4.0 04/26/2022 0542   CL 104 04/26/2022 0542   CO2 23 04/26/2022 0542   BUN 21 04/26/2022 0542   BUN 19 12/04/2021 1414   CREATININE 0.83 04/26/2022 0542   CREATININE 1.02 (H) 08/28/2020 1123      Component Value Date/Time   CALCIUM 8.3 (L) 04/26/2022 0542   ALKPHOS 111 04/24/2022 1011   AST  23 04/24/2022 1011   AST 13 (L) 08/28/2020 1123   ALT 14 04/24/2022 1011   ALT 7 08/28/2020 1123   BILITOT 2.0 (H) 04/24/2022 1011   BILITOT 0.5 12/04/2021 1414   BILITOT 0.8 08/28/2020 1123       RADIOGRAPHIC STUDIES:  No results found.   ASSESSMENT/PLAN:  This is a very pleasant 89 year old Caucasian female with recently diagnosed low-grade myelodysplastic syndrome in addition to questionable vitamin B12 deficiency.   The patient had a bone marrow biopsy and aspirate performed      The cytogenetics showed no 5 q. deletion. She also receives aranesp every 2 weeks and B12 monthly.   She continues to require blood transfusions on a regular basis.  The patient had repeat labs that showed persistent anemia with hemoglobin of 8.1.  She likely will need a blood transfusion in the near future. Reviewed her labs with Dr. Arbutus Ped. We will continue aranesp every 2 weeks and B12 monthly. She is due for aranesp next week on 5/29.   We will continue to monitor labs on a weekly basis and sample to blood bank. If her Hbg is <8, we will consider her for 1 unit of blood. She will likely need a blood transfusion next week as I am anticipating her Hbg to be <8 based on her trends.   I reviewed the patient's history with Dr. Arbutus Ped.  Per Dr. Arbutus Ped no treatment for her low-grade MDS at this time and continue supportive care.    We will see her back in 1 month with her labs and aranesp/B12 injection.   Advised to call sooner if she has new or worsening symptoms of anemia in the interval.   The patient is not a candidate for colonoscopy prior GI due to her age and comorbidities.   The patient has chronic back pain.  We will arrange for her to receive 650 mg of Tylenol today.       No orders of the defined types were placed in this encounter.   The total time spent in the appointment was 20-29 minutes.   Daionna Crossland L Debie Ashline, PA-C 06/27/22

## 2022-06-30 LAB — BPAM RBC
Blood Product Expiration Date: 202406032359
ISSUE DATE / TIME: 202405170952
Unit Type and Rh: 9500

## 2022-06-30 LAB — TYPE AND SCREEN

## 2022-06-30 NOTE — Progress Notes (Signed)
Remote pacemaker transmission.   

## 2022-07-02 ENCOUNTER — Inpatient Hospital Stay: Payer: Medicare Other

## 2022-07-02 ENCOUNTER — Other Ambulatory Visit: Payer: Self-pay | Admitting: Physician Assistant

## 2022-07-02 ENCOUNTER — Other Ambulatory Visit: Payer: Self-pay

## 2022-07-02 ENCOUNTER — Other Ambulatory Visit: Payer: Medicare Other

## 2022-07-02 ENCOUNTER — Ambulatory Visit: Payer: Medicare Other

## 2022-07-02 ENCOUNTER — Inpatient Hospital Stay (HOSPITAL_BASED_OUTPATIENT_CLINIC_OR_DEPARTMENT_OTHER): Payer: Medicare Other | Admitting: Physician Assistant

## 2022-07-02 VITALS — BP 137/58 | HR 66 | Temp 97.7°F | Wt 123.6 lb

## 2022-07-02 DIAGNOSIS — D649 Anemia, unspecified: Secondary | ICD-10-CM

## 2022-07-02 DIAGNOSIS — D469 Myelodysplastic syndrome, unspecified: Secondary | ICD-10-CM

## 2022-07-02 DIAGNOSIS — D539 Nutritional anemia, unspecified: Secondary | ICD-10-CM

## 2022-07-02 DIAGNOSIS — D464 Refractory anemia, unspecified: Secondary | ICD-10-CM

## 2022-07-02 DIAGNOSIS — D461 Refractory anemia with ring sideroblasts: Secondary | ICD-10-CM | POA: Diagnosis not present

## 2022-07-02 LAB — SAMPLE TO BLOOD BANK

## 2022-07-02 LAB — CBC WITH DIFFERENTIAL (CANCER CENTER ONLY)
Abs Immature Granulocytes: 0.01 10*3/uL (ref 0.00–0.07)
Basophils Absolute: 0.1 10*3/uL (ref 0.0–0.1)
Basophils Relative: 1 %
Eosinophils Absolute: 0.3 10*3/uL (ref 0.0–0.5)
Eosinophils Relative: 6 %
HCT: 24.7 % — ABNORMAL LOW (ref 36.0–46.0)
Hemoglobin: 8.1 g/dL — ABNORMAL LOW (ref 12.0–15.0)
Immature Granulocytes: 0 %
Lymphocytes Relative: 29 %
Lymphs Abs: 1.4 10*3/uL (ref 0.7–4.0)
MCH: 32.5 pg (ref 26.0–34.0)
MCHC: 32.8 g/dL (ref 30.0–36.0)
MCV: 99.2 fL (ref 80.0–100.0)
Monocytes Absolute: 0.4 10*3/uL (ref 0.1–1.0)
Monocytes Relative: 8 %
Neutro Abs: 2.6 10*3/uL (ref 1.7–7.7)
Neutrophils Relative %: 56 %
Platelet Count: 219 10*3/uL (ref 150–400)
RBC: 2.49 MIL/uL — ABNORMAL LOW (ref 3.87–5.11)
RDW: 25.2 % — ABNORMAL HIGH (ref 11.5–15.5)
WBC Count: 4.7 10*3/uL (ref 4.0–10.5)
nRBC: 0.6 % — ABNORMAL HIGH (ref 0.0–0.2)

## 2022-07-09 ENCOUNTER — Inpatient Hospital Stay: Payer: Medicare Other

## 2022-07-09 ENCOUNTER — Other Ambulatory Visit: Payer: Self-pay

## 2022-07-09 ENCOUNTER — Other Ambulatory Visit: Payer: Self-pay | Admitting: Medical Oncology

## 2022-07-09 DIAGNOSIS — D461 Refractory anemia with ring sideroblasts: Secondary | ICD-10-CM | POA: Diagnosis not present

## 2022-07-09 DIAGNOSIS — D649 Anemia, unspecified: Secondary | ICD-10-CM

## 2022-07-09 LAB — CBC WITH DIFFERENTIAL (CANCER CENTER ONLY)
Abs Immature Granulocytes: 0 10*3/uL (ref 0.00–0.07)
Basophils Absolute: 0 10*3/uL (ref 0.0–0.1)
Basophils Relative: 1 %
Eosinophils Absolute: 0.2 10*3/uL (ref 0.0–0.5)
Eosinophils Relative: 6 %
HCT: 24.8 % — ABNORMAL LOW (ref 36.0–46.0)
Hemoglobin: 7.9 g/dL — ABNORMAL LOW (ref 12.0–15.0)
Immature Granulocytes: 0 %
Lymphocytes Relative: 27 %
Lymphs Abs: 1 10*3/uL (ref 0.7–4.0)
MCH: 32.4 pg (ref 26.0–34.0)
MCHC: 31.9 g/dL (ref 30.0–36.0)
MCV: 101.6 fL — ABNORMAL HIGH (ref 80.0–100.0)
Monocytes Absolute: 0.4 10*3/uL (ref 0.1–1.0)
Monocytes Relative: 10 %
Neutro Abs: 2.1 10*3/uL (ref 1.7–7.7)
Neutrophils Relative %: 56 %
Platelet Count: 189 10*3/uL (ref 150–400)
RBC: 2.44 MIL/uL — ABNORMAL LOW (ref 3.87–5.11)
RDW: 25.7 % — ABNORMAL HIGH (ref 11.5–15.5)
WBC Count: 3.8 10*3/uL — ABNORMAL LOW (ref 4.0–10.5)
nRBC: 0.5 % — ABNORMAL HIGH (ref 0.0–0.2)

## 2022-07-09 LAB — BPAM RBC
ISSUE DATE / TIME: 202405291003
Unit Type and Rh: 9500

## 2022-07-09 LAB — TYPE AND SCREEN
ABO/RH(D): O NEG
Unit division: 0

## 2022-07-09 LAB — SAMPLE TO BLOOD BANK

## 2022-07-09 LAB — PREPARE RBC (CROSSMATCH)

## 2022-07-09 MED ORDER — SODIUM CHLORIDE 0.9% IV SOLUTION
250.0000 mL | Freq: Once | INTRAVENOUS | Status: AC
  Administered 2022-07-09: 250 mL via INTRAVENOUS

## 2022-07-09 MED ORDER — DIPHENHYDRAMINE HCL 25 MG PO CAPS
25.0000 mg | ORAL_CAPSULE | Freq: Once | ORAL | Status: AC
  Administered 2022-07-09: 25 mg via ORAL
  Filled 2022-07-09: qty 1

## 2022-07-09 MED ORDER — ACETAMINOPHEN 325 MG PO TABS
650.0000 mg | ORAL_TABLET | Freq: Once | ORAL | Status: AC
  Administered 2022-07-09: 650 mg via ORAL
  Filled 2022-07-09: qty 2

## 2022-07-09 NOTE — Patient Instructions (Signed)
Blood Transfusion, Adult A blood transfusion is a procedure in which you receive blood or a type of blood cell (blood component) through an IV. You may need a blood transfusion when you have a low blood count, which is a low number of any blood cell. This may result from a bleeding disorder, illness, injury, or surgery. The blood may come from a donor, or you may be able to have your own blood collected and stored (autologous blood donation) before a planned surgery. The blood given in a transfusion may be made up of different blood components. You may receive: Red blood cells. These carry oxygen to the cells in the body. Platelets. These help your blood to clot. Plasma. This is the liquid part of your blood. It carries proteins and other substances throughout the body. White blood cells. These help you fight infections. If you have hemophilia or another clotting disorder, you may also receive other types of blood products. Depending on the type of blood product, this procedure may take 1-4 hours to complete. Tell a health care provider about: Any bleeding problems you have. Any previous reactions you have had during a blood transfusion. Any allergies you have. All medicines you are taking, including vitamins, herbs, eye drops, creams, and over-the-counter medicines. Any surgeries you have had. Any medical conditions you have. Whether you are pregnant or may be pregnant. What are the risks? Talk with your health care provider about risks. The most common problems include: A mild allergic reaction, such as red, swollen areas of skin (hives) and itching. Fever or chills. This may be the body's response to new blood cells received. This may occur during or up to 4 hours after the transfusion. More serious problems may include: A serious allergic reaction that causes difficulty breathing or swelling around the face and lips. Transfusion-associated circulatory overload (TACO), or too much fluid in  the lungs. This may cause breathing problems. Transfusion-related acute lung injury (TRALI), which causes breathing difficulty and low oxygen in the blood. This can occur within hours of the transfusion or several days later. Iron overload. This can happen after receiving many blood transfusions over a period of time. Infection or virus being transmitted. This is rare because donated blood is carefully tested before it is given. Hemolytic transfusion reaction. This is rare. It happens when the body's defense system (immune system)tries to attack the new blood cells. Symptoms may include fever, chills, nausea, low blood pressure, and low back or chest pain. Transfusion-associated graft-versus-host disease (TAGVHD). This is rare. It happens when donated cells attack the body's healthy tissues. What happens before the procedure? You will have a blood test to check your blood type. This test is done to know what kind of blood your body will accept and to match it to the donor blood. If you are going to have a planned surgery, you may be able to do an autologous blood donation. This may be done in case you need to have a transfusion. You will have your temperature, blood pressure, and pulse checked before the transfusion. If you have had an allergic reaction to a transfusion in the past, you may be given medicine to help prevent a reaction. This medicine may be given to you by mouth (orally) or through an IV. What happens during the procedure?  An IV will be inserted into one of your veins. The bag of blood will be attached to your IV. The blood will then enter through your vein. Your temperature, blood pressure,   and pulse will be monitored during the transfusion. This monitoring is done to detect early signs of a transfusion reaction. Tell your nurse right away if you have any of these symptoms during the transfusion: Shortness of breath or trouble breathing. Chest or back pain. Fever or  chills. Itching or hives. If you have any signs or symptoms of a reaction, your transfusion will be stopped and you may be given medicine. When the transfusion is complete, your IV will be removed. Pressure may be applied to the IV site for a few minutes. A bandage (dressing)will be applied. The procedure may vary among health care providers and hospitals. What happens after the procedure? Your temperature, blood pressure, pulse, breathing rate, and blood oxygen level will be monitored until you leave the hospital or clinic. Your blood may be tested to see how you have responded to the transfusion. You may be warmed with fluids or blankets to maintain a normal body temperature. If you receive your blood transfusion in an outpatient setting, you will be told whom to contact to report any reactions. Where to find more information Visit the [employer]: redcross.org Summary A blood transfusion is a procedure in which you receive blood or a type of blood cell (blood component) through an IV. The blood given in a transfusion may be made up of different blood components. You may receive red blood cells, platelets, plasma, or white blood cells depending on the condition treated. Your temperature, blood pressure, and pulse will be monitored before, during, and after the transfusion. After the transfusion, your blood may be tested to see how your body has responded. This information is not intended to replace advice given to you by your health care provider. Make sure you discuss any questions you have with your health care provider. Document Revised: 04/26/2021 Document Reviewed: 04/26/2021 Elsevier Patient Education  2023 [employer]  

## 2022-07-09 NOTE — Progress Notes (Signed)
Blood orders entered BB notified

## 2022-07-10 LAB — TYPE AND SCREEN: Antibody Screen: NEGATIVE

## 2022-07-10 LAB — BPAM RBC: Blood Product Expiration Date: 202406122359

## 2022-07-16 ENCOUNTER — Other Ambulatory Visit: Payer: Medicare Other

## 2022-07-16 ENCOUNTER — Inpatient Hospital Stay: Payer: Medicare Other | Attending: Internal Medicine

## 2022-07-16 ENCOUNTER — Inpatient Hospital Stay: Payer: Medicare Other

## 2022-07-16 ENCOUNTER — Other Ambulatory Visit: Payer: Self-pay

## 2022-07-16 VITALS — BP 141/67 | HR 64 | Temp 98.8°F | Resp 16

## 2022-07-16 DIAGNOSIS — D649 Anemia, unspecified: Secondary | ICD-10-CM

## 2022-07-16 DIAGNOSIS — E538 Deficiency of other specified B group vitamins: Secondary | ICD-10-CM

## 2022-07-16 DIAGNOSIS — D46C Myelodysplastic syndrome with isolated del(5q) chromosomal abnormality: Secondary | ICD-10-CM | POA: Insufficient documentation

## 2022-07-16 DIAGNOSIS — D539 Nutritional anemia, unspecified: Secondary | ICD-10-CM

## 2022-07-16 DIAGNOSIS — D461 Refractory anemia with ring sideroblasts: Secondary | ICD-10-CM | POA: Insufficient documentation

## 2022-07-16 LAB — CBC WITH DIFFERENTIAL (CANCER CENTER ONLY)
Abs Immature Granulocytes: 0.02 10*3/uL (ref 0.00–0.07)
Basophils Absolute: 0 10*3/uL (ref 0.0–0.1)
Basophils Relative: 1 %
Eosinophils Absolute: 0.3 10*3/uL (ref 0.0–0.5)
Eosinophils Relative: 6 %
HCT: 26.4 % — ABNORMAL LOW (ref 36.0–46.0)
Hemoglobin: 8.8 g/dL — ABNORMAL LOW (ref 12.0–15.0)
Immature Granulocytes: 0 %
Lymphocytes Relative: 29 %
Lymphs Abs: 1.4 10*3/uL (ref 0.7–4.0)
MCH: 33.3 pg (ref 26.0–34.0)
MCHC: 33.3 g/dL (ref 30.0–36.0)
MCV: 100 fL (ref 80.0–100.0)
Monocytes Absolute: 0.4 10*3/uL (ref 0.1–1.0)
Monocytes Relative: 8 %
Neutro Abs: 2.8 10*3/uL (ref 1.7–7.7)
Neutrophils Relative %: 56 %
Platelet Count: 230 10*3/uL (ref 150–400)
RBC: 2.64 MIL/uL — ABNORMAL LOW (ref 3.87–5.11)
RDW: 25.2 % — ABNORMAL HIGH (ref 11.5–15.5)
WBC Count: 4.9 10*3/uL (ref 4.0–10.5)
nRBC: 0 % (ref 0.0–0.2)

## 2022-07-16 LAB — SAMPLE TO BLOOD BANK

## 2022-07-16 MED ORDER — DARBEPOETIN ALFA 300 MCG/0.6ML IJ SOSY
300.0000 ug | PREFILLED_SYRINGE | INTRAMUSCULAR | Status: DC
  Administered 2022-07-16: 300 ug via SUBCUTANEOUS
  Filled 2022-07-16: qty 0.6

## 2022-07-16 NOTE — Patient Instructions (Signed)

## 2022-07-23 ENCOUNTER — Inpatient Hospital Stay: Payer: Medicare Other

## 2022-07-23 ENCOUNTER — Telehealth: Payer: Self-pay

## 2022-07-23 ENCOUNTER — Other Ambulatory Visit: Payer: Self-pay | Admitting: Physician Assistant

## 2022-07-23 DIAGNOSIS — D649 Anemia, unspecified: Secondary | ICD-10-CM

## 2022-07-23 DIAGNOSIS — D461 Refractory anemia with ring sideroblasts: Secondary | ICD-10-CM | POA: Diagnosis not present

## 2022-07-23 DIAGNOSIS — D464 Refractory anemia, unspecified: Secondary | ICD-10-CM

## 2022-07-23 LAB — CBC WITH DIFFERENTIAL (CANCER CENTER ONLY)
Abs Immature Granulocytes: 0.02 10*3/uL (ref 0.00–0.07)
Basophils Absolute: 0 10*3/uL (ref 0.0–0.1)
Basophils Relative: 1 %
Eosinophils Absolute: 0.2 10*3/uL (ref 0.0–0.5)
Eosinophils Relative: 5 %
HCT: 24.3 % — ABNORMAL LOW (ref 36.0–46.0)
Hemoglobin: 7.8 g/dL — ABNORMAL LOW (ref 12.0–15.0)
Immature Granulocytes: 1 %
Lymphocytes Relative: 30 %
Lymphs Abs: 1.3 10*3/uL (ref 0.7–4.0)
MCH: 33.1 pg (ref 26.0–34.0)
MCHC: 32.1 g/dL (ref 30.0–36.0)
MCV: 103 fL — ABNORMAL HIGH (ref 80.0–100.0)
Monocytes Absolute: 0.4 10*3/uL (ref 0.1–1.0)
Monocytes Relative: 9 %
Neutro Abs: 2.4 10*3/uL (ref 1.7–7.7)
Neutrophils Relative %: 54 %
Platelet Count: 262 10*3/uL (ref 150–400)
RBC: 2.36 MIL/uL — ABNORMAL LOW (ref 3.87–5.11)
RDW: 25.1 % — ABNORMAL HIGH (ref 11.5–15.5)
WBC Count: 4.4 10*3/uL (ref 4.0–10.5)
nRBC: 0.5 % — ABNORMAL HIGH (ref 0.0–0.2)

## 2022-07-23 LAB — SAMPLE TO BLOOD BANK

## 2022-07-23 LAB — PREPARE RBC (CROSSMATCH)

## 2022-07-23 NOTE — Progress Notes (Signed)
I called the patient and let them know we arrange for 1 unit of blood to be given tomorrow 07/24/2022.  Recommended arriving 10 to 15 minutes early to allow time to register.  The patient's daughter-in-law expressed understanding with the instructions.  The patient knows not to take off her blood bank bracelet.

## 2022-07-23 NOTE — Telephone Encounter (Signed)
CRITICAL VALUE STICKER  CRITICAL VALUE: HGB 7.8  RECEIVER (on-site recipient of call): Future Yeldell P. LPN   DATE & TIME NOTIFIED: 07/23/22 143p  MESSENGER (representative from lab): Heather  MD NOTIFIED: C [name], PA-C  TIME OF NOTIFICATION: 143p  RESPONSE: Sample sent to blood bank.  One unit of blood to be scheduled.

## 2022-07-24 ENCOUNTER — Ambulatory Visit: Payer: Medicare Other | Admitting: Internal Medicine

## 2022-07-24 ENCOUNTER — Inpatient Hospital Stay: Payer: Medicare Other

## 2022-07-24 ENCOUNTER — Ambulatory Visit: Payer: Medicare Other

## 2022-07-24 ENCOUNTER — Other Ambulatory Visit: Payer: Medicare Other

## 2022-07-24 DIAGNOSIS — D461 Refractory anemia with ring sideroblasts: Secondary | ICD-10-CM | POA: Diagnosis not present

## 2022-07-24 DIAGNOSIS — D464 Refractory anemia, unspecified: Secondary | ICD-10-CM

## 2022-07-24 LAB — TYPE AND SCREEN

## 2022-07-24 MED ORDER — ACETAMINOPHEN 325 MG PO TABS
650.0000 mg | ORAL_TABLET | Freq: Once | ORAL | Status: AC
  Administered 2022-07-24: 650 mg via ORAL
  Filled 2022-07-24: qty 2

## 2022-07-24 MED ORDER — DIPHENHYDRAMINE HCL 25 MG PO CAPS
25.0000 mg | ORAL_CAPSULE | Freq: Once | ORAL | Status: AC
  Administered 2022-07-24: 25 mg via ORAL
  Filled 2022-07-24: qty 1

## 2022-07-24 MED ORDER — SODIUM CHLORIDE 0.9% IV SOLUTION
250.0000 mL | Freq: Once | INTRAVENOUS | Status: AC
  Administered 2022-07-24: 250 mL via INTRAVENOUS

## 2022-07-24 NOTE — Patient Instructions (Signed)

## 2022-07-25 LAB — TYPE AND SCREEN
ABO/RH(D): O NEG
Antibody Screen: NEGATIVE
Unit division: 0

## 2022-07-25 LAB — BPAM RBC
Blood Product Expiration Date: 202407122359
ISSUE DATE / TIME: 202406130931
Unit Type and Rh: 9500

## 2022-07-30 ENCOUNTER — Inpatient Hospital Stay: Payer: Medicare Other

## 2022-07-30 ENCOUNTER — Other Ambulatory Visit: Payer: Self-pay

## 2022-07-30 ENCOUNTER — Inpatient Hospital Stay: Payer: Medicare Other | Admitting: Internal Medicine

## 2022-07-30 ENCOUNTER — Ambulatory Visit: Payer: Medicare Other

## 2022-07-30 ENCOUNTER — Other Ambulatory Visit: Payer: Medicare Other

## 2022-07-30 ENCOUNTER — Ambulatory Visit: Payer: Medicare Other | Admitting: Internal Medicine

## 2022-07-30 VITALS — BP 127/65 | HR 61 | Temp 97.8°F | Resp 17 | Ht 64.0 in | Wt 119.5 lb

## 2022-07-30 DIAGNOSIS — D539 Nutritional anemia, unspecified: Secondary | ICD-10-CM

## 2022-07-30 DIAGNOSIS — E538 Deficiency of other specified B group vitamins: Secondary | ICD-10-CM

## 2022-07-30 DIAGNOSIS — D649 Anemia, unspecified: Secondary | ICD-10-CM

## 2022-07-30 DIAGNOSIS — D461 Refractory anemia with ring sideroblasts: Secondary | ICD-10-CM | POA: Diagnosis not present

## 2022-07-30 DIAGNOSIS — D469 Myelodysplastic syndrome, unspecified: Secondary | ICD-10-CM | POA: Diagnosis not present

## 2022-07-30 LAB — CBC WITH DIFFERENTIAL (CANCER CENTER ONLY)
Abs Immature Granulocytes: 0 10*3/uL (ref 0.00–0.07)
Basophils Absolute: 0 10*3/uL (ref 0.0–0.1)
Basophils Relative: 1 %
Eosinophils Absolute: 0.3 10*3/uL (ref 0.0–0.5)
Eosinophils Relative: 7 %
HCT: 26.4 % — ABNORMAL LOW (ref 36.0–46.0)
Hemoglobin: 8.4 g/dL — ABNORMAL LOW (ref 12.0–15.0)
Immature Granulocytes: 0 %
Lymphocytes Relative: 34 %
Lymphs Abs: 1.4 10*3/uL (ref 0.7–4.0)
MCH: 32.3 pg (ref 26.0–34.0)
MCHC: 31.8 g/dL (ref 30.0–36.0)
MCV: 101.5 fL — ABNORMAL HIGH (ref 80.0–100.0)
Monocytes Absolute: 0.4 10*3/uL (ref 0.1–1.0)
Monocytes Relative: 10 %
Neutro Abs: 2 10*3/uL (ref 1.7–7.7)
Neutrophils Relative %: 48 %
Platelet Count: 170 10*3/uL (ref 150–400)
RBC: 2.6 MIL/uL — ABNORMAL LOW (ref 3.87–5.11)
RDW: 23.5 % — ABNORMAL HIGH (ref 11.5–15.5)
WBC Count: 4 10*3/uL (ref 4.0–10.5)
nRBC: 0 % (ref 0.0–0.2)

## 2022-07-30 LAB — SAMPLE TO BLOOD BANK

## 2022-07-30 MED ORDER — CYANOCOBALAMIN 1000 MCG/ML IJ SOLN
1000.0000 ug | Freq: Once | INTRAMUSCULAR | Status: AC
  Administered 2022-07-30: 1000 ug via INTRAMUSCULAR
  Filled 2022-07-30: qty 1

## 2022-07-30 MED ORDER — DARBEPOETIN ALFA 300 MCG/0.6ML IJ SOSY
300.0000 ug | PREFILLED_SYRINGE | INTRAMUSCULAR | Status: DC
  Administered 2022-07-30: 300 ug via SUBCUTANEOUS
  Filled 2022-07-30: qty 0.6

## 2022-07-30 NOTE — Progress Notes (Signed)
[facility] [phone]   [phone]  OFFICE PROGRESS NOTE  Geoffry Paradise, MD [address] [place] [address]  DIAGNOSIS:  Low-grade myelodysplastic syndrome with ring sideroblasts with 5 q. deletion.  There was also an element of vitamin B12 deficiency after small bowel resection for an obstruction in December 2020.  PRIOR THERAPY:None  CURRENT THERAPY:  1) Vitamin B12 injection initially weekly then every 2 weeks and then monthly. 2) supportive care with transfusion on as-needed basis 3) Aranesp 300 mg subcu every 2 weeks  INTERVAL HISTORY: Megan Hester 89 y.o. female returns to the clinic today for follow-up visit accompanied by her daughter-in-law.  The patient is feeling fine today with no concerning complaints except for the baseline fatigue but she feels a little bit better.  She continues to tolerate her Aranesp injection fairly well.  She denied having any current chest pain but has shortness of breath with exertion with no cough or hemoptysis.  She continues to have some bruises in her upper extremity but part of it secondary to scratches from her dog.  The patient denied having any nausea, vomiting, diarrhea or constipation.  She has no headache or visual changes.  She is here today for evaluation and repeat blood work.   MEDICAL HISTORY: 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    ALLERGIES:  is allergic to shellfish allergy, atropine, codeine, doxycycline, erythromycin, fenoprofen calcium, iodinated contrast media, isopto hyoscine [scopolamine], naproxen, other, oxycodone,  penicillins, sulfa drugs cross reactors, warfarin, ceclor [cefaclor], levaquin [levofloxacin], and tape.  MEDICATIONS:  Current Outpatient Medications  Medication Sig Dispense Refill   acetaminophen (TYLENOL) 500 MG tablet Take 500 mg by mouth every 6 (six) hours as needed for moderate pain.     amiodarone (PACERONE) 200 MG tablet Take 200 mg by mouth daily with supper.     cetirizine (ZYRTEC) 10 MG tablet Take 10 mg by mouth every evening.     Cholecalciferol (VITAMIN D) 1000 UNITS capsule Take 1,000 Units by mouth daily with supper.     clotrimazole-betamethasone (LOTRISONE) cream Apply 1 application  topically 2 (two) times daily as needed (to affected areas).     Glucosamine HCl POWD Take 8.5 g by mouth See admin instructions. Mix 8.5 grams (0.5 scoopful) into juice and drink once a day, as tolerated     Hypromellose (ARTIFICIAL TEARS OP) Place 1 drop into the left eye 3 (three) times daily as needed (for dryness).     Melatonin 10 MG TABS Take 10 mg by mouth at bedtime.     methocarbamol (ROBAXIN) 500 MG tablet Take 500 mg by mouth every 8 (eight) hours as needed for muscle spasms.     metoprolol succinate (TOPROL-XL) 25 MG 24 hr tablet Take 1 tablet (25 mg total) by mouth daily. 90 tablet 3   Multiple Vitamins-Minerals (PRESERVISION/LUTEIN PO) Take 1 capsule by mouth 2 (two) times daily.      oxyCODONE (OXY IR/ROXICODONE) 5 MG immediate release tablet Take 1 tablet (5 mg total) by mouth every 4 (four)  hours as needed for severe pain or breakthrough pain. 30 tablet 0   Polyethyl Glycol-Propyl Glycol (SYSTANE OP) Place 1 drop into the right eye daily as needed (for dryness).     polyethylene glycol (MIRALAX / GLYCOLAX) packet Take 8.5 g by mouth daily as needed for moderate constipation. Mix 8.5 grams (0.5 scoopful) into suggested amount of water and drink once a day- HOLD FOR DIARRHEA     QUEtiapine (SEROQUEL) 25 MG tablet Take 25 mg by mouth at bedtime.     sertraline (ZOLOFT) 100 MG tablet  Take 100 mg by mouth at bedtime.     traMADol (ULTRAM) 50 MG tablet Take 50 mg by mouth 2 (two) times daily as needed.     No current facility-administered medications for this visit.   Facility-Administered Medications Ordered in Other Visits  Medication Dose Route Frequency Provider Last Rate Last Admin   cyanocobalamin (VITAMIN B12) injection 1,000 mcg  1,000 mcg Intramuscular Q30 days Si Gaul, MD   1,000 mcg at 02/24/22 9604    SURGICAL HISTORY:  Past Surgical History:  Procedure Laterality Date   APPLICATION OF WOUND VAC Bilateral 02/01/2019   Procedure: APPLICATION OF WOUND VAC;  Surgeon: Karie Soda, MD;  Location: [facility];  Service: General;  Laterality: Bilateral;   BIOPSY  01/17/2022   Procedure: BIOPSY;  Surgeon: Charlott Rakes, MD;  Location: [facility];  Service: Gastroenterology;;   BREAST BIOPSY Right    BREAST LUMPECTOMY Right 1999   RADIATION   CARDIAC CATHETERIZATION  02/05/1999   HYPERDYNAMIC LEFT VENTRICLE WITH EF OF 75-80%   CARDIOVERSION N/A 01/22/2015   Procedure: CARDIOVERSION;  Surgeon: Lars Masson, MD;  Location: [facility] ENDOSCOPY;  Service: Cardiovascular;  Laterality: N/A;   CARDIOVERSION N/A 02/22/2015   Procedure: CARDIOVERSION;  Surgeon: Lewayne Bunting, MD;  Location: [facility] ENDOSCOPY;  Service: Cardiovascular;  Laterality: N/A;   DILATION AND CURETTAGE OF UTERUS     ENUCLEATION Right 2012   EP IMPLANTABLE DEVICE N/A 02/25/2016   Procedure: Pacemaker Implant;  Surgeon: Hillis Range, MD;  Location: [facility] INVASIVE CV LAB;  Service: Cardiovascular;  Laterality: N/A;   ESOPHAGOGASTRODUODENOSCOPY (EGD) WITH PROPOFOL N/A 01/17/2022   Procedure: ESOPHAGOGASTRODUODENOSCOPY (EGD) WITH PROPOFOL;  Surgeon: Charlott Rakes, MD;  Location: [facility];  Service: Gastroenterology;  Laterality: N/A;   INSERT / REPLACE / REMOVE PACEMAKER     LAPAROSCOPIC SMALL BOWEL RESECTION N/A 02/01/2019   Procedure: LAPAROSCOPIC SMALL BOWEL RESECTION WITH TAP BLOCK;   Surgeon: Karie Soda, MD;  Location: [facility];  Service: General;  Laterality: N/A;   LAPAROSCOPY N/A 02/01/2019   Procedure: LAPAROSCOPY DIAGNOSTIC;  Surgeon: Karie Soda, MD;  Location: [facility];  Service: General;  Laterality: N/A;   LYSIS OF ADHESION N/A 02/01/2019   Procedure: LYSIS OF ADHESION;  Surgeon: Karie Soda, MD;  Location: [facility];  Service: General;  Laterality: N/A;   TONSILLECTOMY      REVIEW OF SYSTEMS:  A comprehensive review of systems was negative except for: Constitutional: positive for fatigue and weight loss   PHYSICAL EXAMINATION: General appearance: alert, cooperative, fatigued, and pale Head: Normocephalic, without obvious abnormality, atraumatic Neck: no adenopathy, no JVD, supple, symmetrical, trachea midline, and thyroid not enlarged, symmetric, no tenderness/mass/nodules Lymph nodes: Cervical, supraclavicular, and axillary nodes normal. Resp: clear to auscultation bilaterally Back: symmetric, no curvature. ROM normal. No CVA tenderness. Cardio: regular rate and rhythm, S1, S2 normal, no murmur, click, rub or gallop GI: soft, non-tender; bowel sounds normal; no masses,  no organomegaly Extremities: extremities  normal, atraumatic, no cyanosis or edema  ECOG PERFORMANCE STATUS: 1 - Symptomatic but completely ambulatory  Blood pressure 127/65, pulse 61, temperature 97.8 F (36.6 C), temperature source Oral, resp. rate 17, height 5\' 4"  (1.626 m), weight 119 lb 8 oz (54.2 kg), SpO2 98 %.  LABORATORY DATA: Lab Results  Component Value Date   WBC 4.0 07/30/2022   HGB 8.4 (L) 07/30/2022   HCT 26.4 (L) 07/30/2022   MCV 101.5 (H) 07/30/2022   PLT 170 07/30/2022      Chemistry      Component Value Date/Time   NA 134 (L) 04/26/2022 0542   NA 141 12/04/2021 1414   K 4.0 04/26/2022 0542   CL 104 04/26/2022 0542   CO2 23 04/26/2022 0542   BUN 21 04/26/2022 0542   BUN 19 12/04/2021 1414   CREATININE 0.83 04/26/2022 0542   CREATININE 1.02 (H) 08/28/2020  1123      Component Value Date/Time   CALCIUM 8.3 (L) 04/26/2022 0542   ALKPHOS 111 04/24/2022 1011   AST 23 04/24/2022 1011   AST 13 (L) 08/28/2020 1123   ALT 14 04/24/2022 1011   ALT 7 08/28/2020 1123   BILITOT 2.0 (H) 04/24/2022 1011   BILITOT 0.5 12/04/2021 1414   BILITOT 0.8 08/28/2020 1123       RADIOGRAPHIC STUDIES: No results found.  ASSESSMENT AND PLAN: This is a very pleasant 89 years old white female with recently diagnosed low-grade myelodysplastic syndrome in addition to questionable vitamin B12 deficiency.   The patient had a bone marrow biopsy and aspirate.   The cytogenetics showed 5 q. deletion. She has been on treatment with supportive care with transfusion. I discussed with them the option of treatment with Revlimid 5 mg p.o. daily for the 5 q. deletion but because of the concern about price and toxicity they declined this option. She is currently on treatment with Aranesp 300 mg subcutaneously every 2 weeks in addition to oral iron tablets to decrease the frequency of her blood transfusion. The patient has been tolerating this treatment well with no concerning adverse effects. Repeat CBC today showed hemoglobin of 8.4 and hematocrit 26.4%.  She will not need any blood transfusion today but she will proceed with her Aranesp injection as planned. The patient will come back for follow-up visit in 2 weeks for evaluation and repeat blood work. She was advised to call immediately if she has any other concerning symptoms in the interval. I also strongly encouraged the patient and her family to consider hospice care if the frequency of transfusion is increased.  The patient voices understanding of current disease status and treatment options and is in agreement with the current care plan.  All questions were answered. The patient knows to call the clinic with any problems, questions or concerns. We can certainly see the patient much sooner if necessary.   Disclaimer:  This note was dictated with voice recognition software. Similar sounding words can inadvertently be transcribed and may not be corrected upon review.

## 2022-08-01 ENCOUNTER — Telehealth: Payer: Self-pay | Admitting: Internal Medicine

## 2022-08-01 NOTE — Telephone Encounter (Signed)
Scheduled per 06/19 los, called and spoke with patient's daughter in law.

## 2022-08-05 ENCOUNTER — Telehealth: Payer: Self-pay | Admitting: Internal Medicine

## 2022-08-05 NOTE — Telephone Encounter (Signed)
Called patient regarding upcoming July/August appointments, patient is notified. 

## 2022-08-06 ENCOUNTER — Other Ambulatory Visit: Payer: Self-pay

## 2022-08-06 ENCOUNTER — Telehealth: Payer: Self-pay | Admitting: Medical Oncology

## 2022-08-06 ENCOUNTER — Other Ambulatory Visit: Payer: Self-pay | Admitting: Medical Oncology

## 2022-08-06 ENCOUNTER — Inpatient Hospital Stay: Payer: Medicare Other

## 2022-08-06 DIAGNOSIS — D461 Refractory anemia with ring sideroblasts: Secondary | ICD-10-CM | POA: Diagnosis not present

## 2022-08-06 DIAGNOSIS — D539 Nutritional anemia, unspecified: Secondary | ICD-10-CM

## 2022-08-06 DIAGNOSIS — D649 Anemia, unspecified: Secondary | ICD-10-CM

## 2022-08-06 LAB — CBC WITH DIFFERENTIAL (CANCER CENTER ONLY)
Abs Immature Granulocytes: 0.01 10*3/uL (ref 0.00–0.07)
Basophils Absolute: 0 10*3/uL (ref 0.0–0.1)
Basophils Relative: 1 %
Eosinophils Absolute: 0.2 10*3/uL (ref 0.0–0.5)
Eosinophils Relative: 6 %
HCT: 23.8 % — ABNORMAL LOW (ref 36.0–46.0)
Hemoglobin: 7.6 g/dL — ABNORMAL LOW (ref 12.0–15.0)
Immature Granulocytes: 0 %
Lymphocytes Relative: 35 %
Lymphs Abs: 1.4 10*3/uL (ref 0.7–4.0)
MCH: 32.9 pg (ref 26.0–34.0)
MCHC: 31.9 g/dL (ref 30.0–36.0)
MCV: 103 fL — ABNORMAL HIGH (ref 80.0–100.0)
Monocytes Absolute: 0.4 10*3/uL (ref 0.1–1.0)
Monocytes Relative: 9 %
Neutro Abs: 2 10*3/uL (ref 1.7–7.7)
Neutrophils Relative %: 49 %
Platelet Count: 217 10*3/uL (ref 150–400)
RBC: 2.31 MIL/uL — ABNORMAL LOW (ref 3.87–5.11)
RDW: 24.5 % — ABNORMAL HIGH (ref 11.5–15.5)
WBC Count: 4 10*3/uL (ref 4.0–10.5)
nRBC: 0 % (ref 0.0–0.2)

## 2022-08-06 LAB — SAMPLE TO BLOOD BANK

## 2022-08-06 NOTE — Telephone Encounter (Signed)
ILVM on DIL, Andrea's phone w/ appt for sat June 28th @ 1000. I reminded her to leave blue arm band on pt.

## 2022-08-06 NOTE — Progress Notes (Signed)
[facility] OFFICE PROGRESS NOTE  Geoffry Paradise, MD [address] [place] [address]  DIAGNOSIS:  Low-grade myelodysplastic syndrome with ring sideroblasts.  There was also an element of vitamin B12 deficiency after small bowel resection for an obstruction in December 2020.   PRIOR THERAPY: None   CURRENT THERAPY: 1) Vitamin B12 injection initially weekly then every 2 weeks and then monthly. Last dose 07/30/22 2) Aranesp 300 mcg every 2 weeks, due today  3) supportive care with transfusion on as-needed basis, most recent 07/24/22   INTERVAL HISTORY: Enid Belin Turpin 89 y.o. female returns to the clinic today for a follow-up visit accompanied by her daughter in law.  The patient was last seen in clinic by Dr. Arbutus Ped on 07/30/22. She is currently receiving aranesp injections every 2 weeks. Her most recent blood transfusion was last week    Essentially, she is requiring blood transfusions on average, once every 2 weeks or so. weeks. Overall, she feels fair today. She always has baseline fatigue.  Daughter-in-law mentioned that she was pretty active at home yesterday.  She reports her energy is about is good as it could be.  She denies any lightheadedness or dyspnea on exertion.  She is no longer on Xarelto.  She denies any abnormal visible bleeding.  She does have some easy bruising secondary to lab draws and scratches and bruising from her dog.  She is here today for evaluation and repeat blood work before undergoing her next Aranesp injection.   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  MEDICATIONS:  Current Outpatient Medications  Medication Sig Dispense Refill   acetaminophen (TYLENOL) 500 MG tablet Take 500 mg by mouth every 6 (six) hours as needed for moderate pain.     amiodarone (PACERONE) 200 MG tablet Take 200 mg by mouth daily with supper.     cetirizine (ZYRTEC) 10 MG tablet Take 10 mg by mouth every evening.     Cholecalciferol (VITAMIN D) 1000 UNITS capsule Take 1,000 Units by mouth daily with supper.     clotrimazole-betamethasone (LOTRISONE) cream Apply 1 application  topically 2 (two) times daily as needed (to affected areas).     Glucosamine HCl POWD Take 8.5 g by mouth See admin instructions. Mix 8.5 grams (0.5 scoopful) into juice and drink once a day, as tolerated     Hypromellose (ARTIFICIAL TEARS OP) Place 1 drop into the left eye 3 (three) times daily as needed (for dryness).     Melatonin 10 MG TABS Take 10 mg by mouth at bedtime.     methocarbamol (ROBAXIN) 500 MG tablet Take 500 mg by mouth every 8 (eight) hours as needed for muscle spasms.     metoprolol succinate (TOPROL-XL) 25 MG 24 hr tablet Take 1 tablet (25 mg total) by mouth daily. 90 tablet 3   Multiple Vitamins-Minerals (  PRESERVISION/LUTEIN PO) Take 1 capsule by mouth 2 (two) times daily.      oxyCODONE (OXY IR/ROXICODONE) 5 MG immediate release tablet Take 1 tablet (5 mg total) by mouth every 4 (four) hours as needed for severe pain or breakthrough pain. 30 tablet 0   Polyethyl Glycol-Propyl Glycol (SYSTANE OP) Place 1 drop into the right eye daily as needed (for dryness).     polyethylene glycol (MIRALAX / GLYCOLAX) packet Take 8.5 g by mouth daily as needed for moderate constipation. Mix 8.5 grams (0.5 scoopful) into  suggested amount of water and drink once a day- HOLD FOR DIARRHEA     QUEtiapine (SEROQUEL) 25 MG tablet Take 25 mg by mouth at bedtime.     sertraline (ZOLOFT) 100 MG tablet Take 100 mg by mouth at bedtime.     traMADol (ULTRAM) 50 MG tablet Take 50 mg by mouth 2 (two) times daily as needed.     No current facility-administered medications for this visit.   Facility-Administered Medications Ordered in Other Visits  Medication Dose Route Frequency Provider Last Rate Last Admin   cyanocobalamin (VITAMIN B12) injection 1,000 mcg  1,000 mcg Intramuscular Q30 days Si Gaul, MD   1,000 mcg at 02/24/22 4098    SURGICAL HISTORY:  Past Surgical History:  Procedure Laterality Date   APPLICATION OF WOUND VAC Bilateral 02/01/2019   Procedure: APPLICATION OF WOUND VAC;  Surgeon: Karie Soda, MD;  Location: [facility]S;  Service: General;  Laterality: Bilateral;   BIOPSY  01/17/2022   Procedure: BIOPSY;  Surgeon: Charlott Rakes, MD;  Location: [facility]OSCOPY;  Service: Gastroenterology;;   BREAST BIOPSY Right    BREAST LUMPECTOMY Right 1999   RADIATION   CARDIAC CATHETERIZATION  02/05/1999   HYPERDYNAMIC LEFT VENTRICLE WITH EF OF 75-80%   CARDIOVERSION N/A 01/22/2015   Procedure: CARDIOVERSION;  Surgeon: Lars Masson, MD;  Location: [facility] END[facility]COPY;  Service: Cardiovascular;  Laterality: N/A;   CARDIOVERSION N/A 02/22/2015   Procedure: CARDIOVERSION;  Surgeon: Lewayne Bunting, MD;  Location: [facility] ENDOSCOPY;  Service: Cardiovascular;  Laterality: N/A;   DILATION AND CURETTAGE OF UTERUS     ENUCLEATION Right 2012   EP IMPLANTABLE DEVICE N/A 02/25/2016   Procedure: Pacemaker Implant;  Surgeon: Hillis Range, MD;  Location: [facility] INVASIVE CV LAB;  Service: Cardiovascular;  Laterality: N/A;   ESOPHAGOGASTRODUODENOSCOPY (EGD) WITH PROPOFOL N/A 01/17/2022   Procedure: ESOPHAGOGASTRODUODENOSCOPY (EGD) WITH PROPOFOL;  Surgeon: Charlott Rakes, MD;  Location: [facility] ENDOSCOPY;  Service: Gastroenterology;   Laterality: N/A;   INSERT / REPLACE / REMOVE PACEMAKER     LAPAROSCOPIC SMALL BOWEL RESECTION N/A 02/01/2019   Procedure: LAPAROSCOPIC SMALL BOWEL RESECTION WITH TAP BLOCK;  Surgeon: Karie Soda, MD;  Location: [facility];  Service: General;  Laterality: N/A;   LAPAROSCOPY N/A 02/01/2019   Procedure: LAPAROSCOPY DIAGNOSTIC;  Surgeon: Karie Soda, MD;  Location: [facility];  Service: General;  Laterality: N/A;   LYSIS OF ADHESION N/A 02/01/2019   Procedure: LYSIS OF ADHESION;  Surgeon: Karie Soda, MD;  Location: [facility];  Service: General;  Laterality: N/A;   TONSILLECTOMY      REVIEW OF SYSTEMS:   Constitutional: Positive for stable fatigue and generalized weakness. Negative for appetite change, chills, fever and unexpected weight change.  HENT: Negative for mouth sores, nosebleeds, sore throat and trouble swallowing.   Eyes: Negative for eye problems and icterus.  Respiratory: Negative for cough, dyspnea on exertion, hemoptysis, and wheezing.   Cardiovascular: Negative for chest pain and leg swelling.  Gastrointestinal:  Negative for abdominal pain, constipation, diarrhea, nausea and vomiting.  Genitourinary: Negative for bladder incontinence, difficulty urinating, dysuria, frequency and hematuria.   Musculoskeletal: Negative for back pain, gait problem, neck pain and neck stiffness.  Skin: Negative for itching and rash.  Neurological: Negative for dizziness, extremity weakness, gait problem, headaches, light-headedness and seizures.  Hematological: Negative for adenopathy. Positive for easy bruising. Does not bleed easily.  Psychiatric/Behavioral: Negative for confusion, depression and sleep disturbance. The patient is not nervous/anxious.       PHYSICAL EXAMINATION:  There were no vitals taken for this visit.  ECOG PERFORMANCE STATUS: 2  Physical Exam  Constitutional: Oriented to person, place, and time and elderly appearing female and in no distress.  HENT:  Head: Normocephalic  and atraumatic.  Mouth/Throat: Oropharynx is clear and moist. No oropharyngeal exudate.  Eyes: Conjunctivae are normal. Right eye exhibits no discharge. Left eye exhibits no discharge. No scleral icterus.  Neck: Normal range of motion. Neck supple.  Cardiovascular: Normal rate, regular rhythm, normal heart sounds and intact distal pulses.   Pulmonary/Chest: Effort normal and breath sounds normal. No respiratory distress. No wheezes. No rales.  Abdominal: Soft. Bowel sounds are normal. Exhibits no distension and no mass. There is no tenderness.  Musculoskeletal: Normal range of motion. Mild lower extremity edema that comes and goes per patient daughter, worse with increased activity/ambulation.   Lymphadenopathy:    No cervical adenopathy.  Neurological: Alert and oriented to person, place, and time. Exhibits muscle wasting. She was examined in the wheelchair.  Skin: Positive for pallor. Skin is warm and dry. No rash noted. Not diaphoretic. No erythema.  Psychiatric: Mood, memory and judgment normal.  Vitals reviewed  LABORATORY DATA: Lab Results  Component Value Date   WBC 4.0 07/30/2022   HGB 8.4 (L) 07/30/2022   HCT 26.4 (L) 07/30/2022   MCV 101.5 (H) 07/30/2022   PLT 170 07/30/2022      Chemistry      Component Value Date/Time   NA 134 (L) 04/26/2022 0542   NA 141 12/04/2021 1414   K 4.0 04/26/2022 0542   CL 104 04/26/2022 0542   CO2 23 04/26/2022 0542   BUN 21 04/26/2022 0542   BUN 19 12/04/2021 1414   CREATININE 0.83 04/26/2022 0542   CREATININE 1.02 (H) 08/28/2020 1123      Component Value Date/Time   CALCIUM 8.3 (L) 04/26/2022 0542   ALKPHOS 111 04/24/2022 1011   AST 23 04/24/2022 1011   AST 13 (L) 08/28/2020 1123   ALT 14 04/24/2022 1011   ALT 7 08/28/2020 1123   BILITOT 2.0 (H) 04/24/2022 1011   BILITOT 0.5 12/04/2021 1414   BILITOT 0.8 08/28/2020 1123       RADIOGRAPHIC STUDIES:  No results found.   ASSESSMENT/PLAN:  This is a very pleasant 89 year  old Caucasian female with recently diagnosed low-grade myelodysplastic syndrome in addition to questionable vitamin B12 deficiency.   The patient had a bone marrow biopsy and aspirate performed      The cytogenetics showed no 5 q. deletion. She also receives aranesp every 2 weeks and B12 monthly.    She continues to require blood transfusions on a regular basis. Her most recent was last week.     The patient had repeat labs that showed persistent anemia with hemoglobin of 8.2.  She likely will need a blood transfusion in the near future but does not need one at this time. We will continue aranesp every 2 weeks and B12  monthly. She is due for aranesp today. B12 is due in two weeks.    We will continue to monitor labs on a weekly basis and sample to blood bank. If her Hbg is <8, we will consider her for 1 unit of blood. She will likely need a blood transfusion next week as I am anticipating her Hbg to be <8 based on her trends.   I previously reviewed the patient's history with Dr. Arbutus Ped.  Per Dr. Arbutus Ped no treatment for her low-grade MDS at this time and continue supportive care.    We will see her back in 4 weeks with her labs and aranesp.    Advised to call sooner if she has new or worsening symptoms of anemia in the interval.   The patient is not a candidate for colonoscopy prior GI due to her age and comorbidities.   The patient was advised to call immediately if she has any concerning symptoms in the interval. The patient voices understanding of current disease status and treatment options and is in agreement with the current care plan. All questions were answered. The patient knows to call the clinic with any problems, questions or concerns. We can certainly see the patient much sooner if necessary    No orders of the defined types were placed in this encounter.   The total time spent in the appointment was 20-29 minutes   Aditya Nastasi L Jomayra Novitsky, PA-C 08/06/22

## 2022-08-06 NOTE — Progress Notes (Signed)
Type and cross and prepare orders released.

## 2022-08-08 ENCOUNTER — Other Ambulatory Visit: Payer: Self-pay | Admitting: Medical Oncology

## 2022-08-08 DIAGNOSIS — D539 Nutritional anemia, unspecified: Secondary | ICD-10-CM

## 2022-08-08 LAB — TYPE AND SCREEN

## 2022-08-08 LAB — PREPARE RBC (CROSSMATCH)

## 2022-08-09 ENCOUNTER — Inpatient Hospital Stay: Payer: Medicare Other

## 2022-08-09 VITALS — BP 127/59 | HR 59 | Temp 98.4°F | Resp 20

## 2022-08-09 DIAGNOSIS — E538 Deficiency of other specified B group vitamins: Secondary | ICD-10-CM

## 2022-08-09 DIAGNOSIS — D461 Refractory anemia with ring sideroblasts: Secondary | ICD-10-CM | POA: Diagnosis not present

## 2022-08-09 DIAGNOSIS — D539 Nutritional anemia, unspecified: Secondary | ICD-10-CM

## 2022-08-09 LAB — BPAM RBC
Blood Product Expiration Date: 202407242359
Blood Product Expiration Date: 202407252359
Unit Type and Rh: 9500

## 2022-08-09 LAB — TYPE AND SCREEN

## 2022-08-09 LAB — PREPARE RBC (CROSSMATCH)

## 2022-08-09 MED ORDER — SODIUM CHLORIDE 0.9% IV SOLUTION
250.0000 mL | Freq: Once | INTRAVENOUS | Status: AC
  Administered 2022-08-09: 250 mL via INTRAVENOUS

## 2022-08-09 MED ORDER — DIPHENHYDRAMINE HCL 25 MG PO CAPS
25.0000 mg | ORAL_CAPSULE | Freq: Once | ORAL | Status: AC
  Administered 2022-08-09: 25 mg via ORAL
  Filled 2022-08-09: qty 1

## 2022-08-09 MED ORDER — ACETAMINOPHEN 325 MG PO TABS
650.0000 mg | ORAL_TABLET | Freq: Once | ORAL | Status: AC
  Administered 2022-08-09: 650 mg via ORAL
  Filled 2022-08-09: qty 2

## 2022-08-09 MED ORDER — CYANOCOBALAMIN 1000 MCG/ML IJ SOLN: 1000.0000 ug | Freq: Once | INTRAMUSCULAR | Status: DC

## 2022-08-09 NOTE — Patient Instructions (Signed)

## 2022-08-10 LAB — TYPE AND SCREEN
ABO/RH(D): O NEG
Unit division: 0

## 2022-08-10 LAB — BPAM RBC: ISSUE DATE / TIME: 202406291023

## 2022-08-11 LAB — TYPE AND SCREEN
Antibody Screen: NEGATIVE
Unit division: 0

## 2022-08-11 LAB — BPAM RBC: Unit Type and Rh: 9500

## 2022-08-12 ENCOUNTER — Inpatient Hospital Stay: Payer: Medicare Other | Attending: Internal Medicine

## 2022-08-12 ENCOUNTER — Inpatient Hospital Stay: Payer: Medicare Other | Admitting: Physician Assistant

## 2022-08-12 ENCOUNTER — Other Ambulatory Visit: Payer: Self-pay

## 2022-08-12 ENCOUNTER — Inpatient Hospital Stay: Payer: Medicare Other

## 2022-08-12 VITALS — BP 145/74 | HR 62 | Temp 97.0°F | Resp 13 | Wt 124.6 lb

## 2022-08-12 DIAGNOSIS — D461 Refractory anemia with ring sideroblasts: Secondary | ICD-10-CM | POA: Diagnosis present

## 2022-08-12 DIAGNOSIS — D539 Nutritional anemia, unspecified: Secondary | ICD-10-CM

## 2022-08-12 DIAGNOSIS — E538 Deficiency of other specified B group vitamins: Secondary | ICD-10-CM

## 2022-08-12 DIAGNOSIS — D469 Myelodysplastic syndrome, unspecified: Secondary | ICD-10-CM | POA: Diagnosis not present

## 2022-08-12 LAB — CBC WITH DIFFERENTIAL (CANCER CENTER ONLY)
Abs Immature Granulocytes: 0.01 10*3/uL (ref 0.00–0.07)
Basophils Absolute: 0 10*3/uL (ref 0.0–0.1)
Basophils Relative: 1 %
Eosinophils Absolute: 0.3 10*3/uL (ref 0.0–0.5)
Eosinophils Relative: 6 %
HCT: 25.9 % — ABNORMAL LOW (ref 36.0–46.0)
Hemoglobin: 8.2 g/dL — ABNORMAL LOW (ref 12.0–15.0)
Immature Granulocytes: 0 %
Lymphocytes Relative: 28 %
Lymphs Abs: 1.1 10*3/uL (ref 0.7–4.0)
MCH: 32.4 pg (ref 26.0–34.0)
MCHC: 31.7 g/dL (ref 30.0–36.0)
MCV: 102.4 fL — ABNORMAL HIGH (ref 80.0–100.0)
Monocytes Absolute: 0.3 10*3/uL (ref 0.1–1.0)
Monocytes Relative: 9 %
Neutro Abs: 2.3 10*3/uL (ref 1.7–7.7)
Neutrophils Relative %: 56 %
Platelet Count: 156 10*3/uL (ref 150–400)
RBC: 2.53 MIL/uL — ABNORMAL LOW (ref 3.87–5.11)
RDW: 23.1 % — ABNORMAL HIGH (ref 11.5–15.5)
WBC Count: 4 10*3/uL (ref 4.0–10.5)
nRBC: 0 % (ref 0.0–0.2)

## 2022-08-12 LAB — SAMPLE TO BLOOD BANK

## 2022-08-12 MED ORDER — DARBEPOETIN ALFA 300 MCG/0.6ML IJ SOSY
300.0000 ug | PREFILLED_SYRINGE | INTRAMUSCULAR | Status: DC
  Administered 2022-08-12: 300 ug via SUBCUTANEOUS
  Filled 2022-08-12: qty 0.6

## 2022-08-12 MED ORDER — CYANOCOBALAMIN 1000 MCG/ML IJ SOLN
1000.0000 ug | Freq: Once | INTRAMUSCULAR | Status: AC
  Administered 2022-08-12: 1000 ug via INTRAMUSCULAR
  Filled 2022-08-12: qty 1

## 2022-08-13 ENCOUNTER — Inpatient Hospital Stay: Payer: Medicare Other

## 2022-08-13 ENCOUNTER — Other Ambulatory Visit: Payer: Medicare Other

## 2022-08-20 ENCOUNTER — Other Ambulatory Visit: Payer: Self-pay | Admitting: Physician Assistant

## 2022-08-20 ENCOUNTER — Inpatient Hospital Stay: Payer: Medicare Other

## 2022-08-20 DIAGNOSIS — D464 Refractory anemia, unspecified: Secondary | ICD-10-CM

## 2022-08-20 DIAGNOSIS — D461 Refractory anemia with ring sideroblasts: Secondary | ICD-10-CM | POA: Diagnosis not present

## 2022-08-20 DIAGNOSIS — D649 Anemia, unspecified: Secondary | ICD-10-CM

## 2022-08-20 LAB — CBC WITH DIFFERENTIAL (CANCER CENTER ONLY)
Abs Immature Granulocytes: 0.01 10*3/uL (ref 0.00–0.07)
Basophils Absolute: 0 10*3/uL (ref 0.0–0.1)
Basophils Relative: 1 %
Eosinophils Absolute: 0.2 10*3/uL (ref 0.0–0.5)
Eosinophils Relative: 6 %
HCT: 23.5 % — ABNORMAL LOW (ref 36.0–46.0)
Hemoglobin: 7.4 g/dL — ABNORMAL LOW (ref 12.0–15.0)
Immature Granulocytes: 0 %
Lymphocytes Relative: 28 %
Lymphs Abs: 1 10*3/uL (ref 0.7–4.0)
MCH: 32.7 pg (ref 26.0–34.0)
MCHC: 31.5 g/dL (ref 30.0–36.0)
MCV: 104 fL — ABNORMAL HIGH (ref 80.0–100.0)
Monocytes Absolute: 0.3 10*3/uL (ref 0.1–1.0)
Monocytes Relative: 9 %
Neutro Abs: 2 10*3/uL (ref 1.7–7.7)
Neutrophils Relative %: 56 %
Platelet Count: 157 10*3/uL (ref 150–400)
RBC: 2.26 MIL/uL — ABNORMAL LOW (ref 3.87–5.11)
RDW: 23.6 % — ABNORMAL HIGH (ref 11.5–15.5)
WBC Count: 3.6 10*3/uL — ABNORMAL LOW (ref 4.0–10.5)
nRBC: 0.8 % — ABNORMAL HIGH (ref 0.0–0.2)

## 2022-08-20 LAB — SAMPLE TO BLOOD BANK

## 2022-08-20 LAB — PREPARE RBC (CROSSMATCH)

## 2022-08-21 ENCOUNTER — Other Ambulatory Visit: Payer: Self-pay

## 2022-08-21 ENCOUNTER — Inpatient Hospital Stay: Payer: Medicare Other

## 2022-08-21 DIAGNOSIS — D464 Refractory anemia, unspecified: Secondary | ICD-10-CM

## 2022-08-21 DIAGNOSIS — D461 Refractory anemia with ring sideroblasts: Secondary | ICD-10-CM | POA: Diagnosis not present

## 2022-08-21 MED ORDER — ACETAMINOPHEN 325 MG PO TABS
650.0000 mg | ORAL_TABLET | Freq: Once | ORAL | Status: AC
  Administered 2022-08-21: 650 mg via ORAL
  Filled 2022-08-21: qty 2

## 2022-08-21 MED ORDER — DIPHENHYDRAMINE HCL 25 MG PO CAPS
25.0000 mg | ORAL_CAPSULE | Freq: Once | ORAL | Status: AC
  Administered 2022-08-21: 25 mg via ORAL
  Filled 2022-08-21: qty 1

## 2022-08-21 MED ORDER — SODIUM CHLORIDE 0.9% IV SOLUTION
250.0000 mL | Freq: Once | INTRAVENOUS | Status: AC
  Administered 2022-08-21: 250 mL via INTRAVENOUS

## 2022-08-21 NOTE — Patient Instructions (Signed)
Blood Transfusion, Adult A blood transfusion is a procedure in which you receive blood through an IV tube. You may need this procedure because of: A bleeding disorder. An illness. An injury. A surgery. The blood may come from someone else (a donor). You may also be able to donate blood for yourself before a surgery. The blood given in a transfusion may be made up of different types of cells. You may get: Red blood cells. These carry oxygen to the cells in the body. Platelets. These help your blood to clot. Plasma. This is the liquid part of your blood. It carries proteins and other substances through the body. White blood cells. These help you fight infections. If you have a clotting disorder, you may also get other types of blood products. Depending on the type of blood product, this procedure may take 1-4 hours to complete. Tell your doctor about: Any bleeding problems you have. Any reactions you have had during a blood transfusion in the past. Any allergies you have. All medicines you are taking, including vitamins, herbs, eye drops, creams, and over-the-counter medicines. Any surgeries you have had. Any medical conditions you have. Whether you are pregnant or may be pregnant. What are the risks? Talk with your health care provider about risks. The most common problems include: A mild allergic reaction. This includes red, swollen areas of skin (hives) and itching. Fever or chills. This may be the body's response to new blood cells received. This may happen during or up to 4 hours after the transfusion. More serious problems may include: A serious allergic reaction. This includes breathing trouble or swelling around the face and lips. Too much fluid in the lungs. This may cause breathing problems. Lung injury. This causes breathing trouble and low oxygen in the blood. This can happen within hours of the transfusion or days later. Too much iron. This can happen after getting many blood  transfusions over a period of time. An infection or virus passed through the blood. This is rare. Donated blood is carefully tested before it is given. Your body's defense system (immune system) trying to attack the new blood cells. This is rare. Symptoms may include fever, chills, nausea, low blood pressure, and low back or chest pain. Donated cells attacking healthy tissues. This is rare. What happens before the procedure? You will have a blood test to find out your blood type. The test also finds out what type of blood your body will accept and matches it to the donor type. If you are going to have a planned surgery, you may be able to donate your own blood. This may be done in case you need a transfusion. You will have your temperature, blood pressure, and pulse checked. You may receive medicine to help prevent an allergic reaction. This may be done if you have had a reaction to a transfusion before. This medicine may be given to you by mouth or through an IV tube. What happens during the procedure?  An IV tube will be put into one of your veins. The bag of blood will be attached to your IV tube. Then, the blood will enter through your vein. Your temperature, blood pressure, and pulse will be checked often. This is done to find early signs of a transfusion reaction. Tell your nurse right away if you have any of these symptoms: Shortness of breath or trouble breathing. Chest or back pain. Fever or chills. Red, swollen areas of skin or itching. If you have any signs   or symptoms of a reaction, your transfusion will be stopped. You may also be given medicine. When the transfusion is finished, your IV tube will be taken out. Pressure may be put on the IV site for a few minutes. A bandage (dressing) will be put on the IV site. The procedure may vary among doctors and hospitals. What happens after the procedure? You will be monitored until you leave the hospital or clinic. This includes  checking your temperature, blood pressure, pulse, breathing rate, and blood oxygen level. Your blood may be tested to see how you have responded to the transfusion. You may be warmed with fluids or blankets. This is done to keep the temperature of your body normal. If you have your procedure in an outpatient setting, you will be told whom to contact to report any reactions. Where to find more information Visit the [employer]: redcross.org Summary A blood transfusion is a procedure in which you receive blood through an IV tube. The blood you are given may be made up of different blood cells. You may receive red blood cells, platelets, plasma, or white blood cells. Your temperature, blood pressure, and pulse will be checked often. After the procedure, your blood may be tested to see how you have responded. This information is not intended to replace advice given to you by your health care provider. Make sure you discuss any questions you have with your health care provider. Document Revised: 04/26/2021 Document Reviewed: 04/26/2021 Elsevier Patient Education  2024 [employer]  

## 2022-08-22 LAB — TYPE AND SCREEN
ABO/RH(D): O NEG
Antibody Screen: NEGATIVE
Unit division: 0

## 2022-08-22 LAB — BPAM RBC
Blood Product Expiration Date: 202408122359
ISSUE DATE / TIME: 202407111039
Unit Type and Rh: 9500

## 2022-08-25 ENCOUNTER — Ambulatory Visit: Payer: Medicare Other

## 2022-08-25 DIAGNOSIS — I495 Sick sinus syndrome: Secondary | ICD-10-CM | POA: Diagnosis not present

## 2022-08-26 LAB — CUP PACEART REMOTE DEVICE CHECK
Battery Remaining Longevity: 36 mo
Battery Voltage: 2.96 V
Brady Statistic AP VP Percent: 0.86 %
Brady Statistic AP VS Percent: 71.45 %
Brady Statistic AS VP Percent: 0.41 %
Brady Statistic AS VS Percent: 27.27 %
Brady Statistic RA Percent Paced: 71.55 %
Brady Statistic RV Percent Paced: 1.17 %
Date Time Interrogation Session: 20240715133837
Implantable Lead Connection Status: 753985
Implantable Lead Connection Status: 753985
Implantable Lead Implant Date: 20180115
Implantable Lead Implant Date: 20180115
Implantable Lead Location: 753859
Implantable Lead Location: 753860
Implantable Lead Model: 5076
Implantable Lead Model: 5076
Implantable Pulse Generator Implant Date: 20180115
Lead Channel Impedance Value: 323 Ohm
Lead Channel Impedance Value: 361 Ohm
Lead Channel Impedance Value: 437 Ohm
Lead Channel Impedance Value: 437 Ohm
Lead Channel Pacing Threshold Amplitude: 0.75 V
Lead Channel Pacing Threshold Amplitude: 0.875 V
Lead Channel Pacing Threshold Pulse Width: 0.4 ms
Lead Channel Pacing Threshold Pulse Width: 0.4 ms
Lead Channel Sensing Intrinsic Amplitude: 0.375 mV
Lead Channel Sensing Intrinsic Amplitude: 0.375 mV
Lead Channel Sensing Intrinsic Amplitude: 12.25 mV
Lead Channel Sensing Intrinsic Amplitude: 12.25 mV
Lead Channel Setting Pacing Amplitude: 2 V
Lead Channel Setting Pacing Amplitude: 2.5 V
Lead Channel Setting Pacing Pulse Width: 0.4 ms
Lead Channel Setting Sensing Sensitivity: 2 mV
Zone Setting Status: 755011
Zone Setting Status: 755011

## 2022-08-27 ENCOUNTER — Other Ambulatory Visit: Payer: Medicare Other

## 2022-08-27 ENCOUNTER — Other Ambulatory Visit: Payer: Self-pay

## 2022-08-27 ENCOUNTER — Ambulatory Visit: Payer: Medicare Other | Admitting: Internal Medicine

## 2022-08-27 ENCOUNTER — Inpatient Hospital Stay: Payer: Medicare Other

## 2022-08-27 ENCOUNTER — Ambulatory Visit: Payer: Medicare Other

## 2022-08-27 ENCOUNTER — Ambulatory Visit: Payer: Medicare Other | Admitting: Physician Assistant

## 2022-08-27 VITALS — BP 154/79 | HR 62 | Temp 98.3°F | Resp 18

## 2022-08-27 DIAGNOSIS — E538 Deficiency of other specified B group vitamins: Secondary | ICD-10-CM

## 2022-08-27 DIAGNOSIS — D461 Refractory anemia with ring sideroblasts: Secondary | ICD-10-CM | POA: Diagnosis not present

## 2022-08-27 DIAGNOSIS — D649 Anemia, unspecified: Secondary | ICD-10-CM

## 2022-08-27 DIAGNOSIS — D539 Nutritional anemia, unspecified: Secondary | ICD-10-CM

## 2022-08-27 LAB — CBC WITH DIFFERENTIAL (CANCER CENTER ONLY)
Abs Immature Granulocytes: 0.01 10*3/uL (ref 0.00–0.07)
Basophils Absolute: 0 10*3/uL (ref 0.0–0.1)
Basophils Relative: 1 %
Eosinophils Absolute: 0.2 10*3/uL (ref 0.0–0.5)
Eosinophils Relative: 6 %
HCT: 25.4 % — ABNORMAL LOW (ref 36.0–46.0)
Hemoglobin: 8.3 g/dL — ABNORMAL LOW (ref 12.0–15.0)
Immature Granulocytes: 0 %
Lymphocytes Relative: 27 %
Lymphs Abs: 1.1 10*3/uL (ref 0.7–4.0)
MCH: 32.9 pg (ref 26.0–34.0)
MCHC: 32.7 g/dL (ref 30.0–36.0)
MCV: 100.8 fL — ABNORMAL HIGH (ref 80.0–100.0)
Monocytes Absolute: 0.3 10*3/uL (ref 0.1–1.0)
Monocytes Relative: 9 %
Neutro Abs: 2.3 10*3/uL (ref 1.7–7.7)
Neutrophils Relative %: 57 %
Platelet Count: 152 10*3/uL (ref 150–400)
RBC: 2.52 MIL/uL — ABNORMAL LOW (ref 3.87–5.11)
RDW: 22.5 % — ABNORMAL HIGH (ref 11.5–15.5)
WBC Count: 3.9 10*3/uL — ABNORMAL LOW (ref 4.0–10.5)
nRBC: 0 % (ref 0.0–0.2)

## 2022-08-27 LAB — SAMPLE TO BLOOD BANK

## 2022-08-27 MED ORDER — DARBEPOETIN ALFA 300 MCG/0.6ML IJ SOSY
300.0000 ug | PREFILLED_SYRINGE | INTRAMUSCULAR | Status: DC
  Administered 2022-08-27: 300 ug via SUBCUTANEOUS
  Filled 2022-08-27: qty 0.6

## 2022-08-27 MED ORDER — CYANOCOBALAMIN 1000 MCG/ML IJ SOLN: 1000.0000 ug | Freq: Once | INTRAMUSCULAR | Status: DC

## 2022-08-28 ENCOUNTER — Ambulatory Visit: Payer: Medicare Other | Admitting: Physician Assistant

## 2022-08-28 ENCOUNTER — Other Ambulatory Visit: Payer: Medicare Other

## 2022-08-28 ENCOUNTER — Ambulatory Visit: Payer: Medicare Other

## 2022-08-31 ENCOUNTER — Other Ambulatory Visit: Payer: Self-pay

## 2022-08-31 ENCOUNTER — Emergency Department (HOSPITAL_COMMUNITY): Payer: Medicare Other

## 2022-08-31 ENCOUNTER — Inpatient Hospital Stay (HOSPITAL_COMMUNITY)
Admission: EM | Admit: 2022-08-31 | Discharge: 2022-09-03 | DRG: 082 | Disposition: A | Discharge status: Home Health | Payer: Medicare Other | Attending: Internal Medicine | Admitting: Internal Medicine

## 2022-08-31 DIAGNOSIS — S065XAA Traumatic subdural hemorrhage with loss of consciousness status unknown, initial encounter: Principal | ICD-10-CM | POA: Diagnosis present

## 2022-08-31 DIAGNOSIS — Z881 Allergy status to other antibiotic agents status: Secondary | ICD-10-CM | POA: Diagnosis not present

## 2022-08-31 DIAGNOSIS — F419 Anxiety disorder, unspecified: Secondary | ICD-10-CM | POA: Insufficient documentation

## 2022-08-31 DIAGNOSIS — Z880 Allergy status to penicillin: Secondary | ICD-10-CM | POA: Diagnosis not present

## 2022-08-31 DIAGNOSIS — Z888 Allergy status to other drugs, medicaments and biological substances status: Secondary | ICD-10-CM

## 2022-08-31 DIAGNOSIS — Z886 Allergy status to analgesic agent status: Secondary | ICD-10-CM

## 2022-08-31 DIAGNOSIS — D469 Myelodysplastic syndrome, unspecified: Secondary | ICD-10-CM | POA: Diagnosis not present

## 2022-08-31 DIAGNOSIS — F0393 Unspecified dementia, unspecified severity, with mood disturbance: Secondary | ICD-10-CM | POA: Diagnosis present

## 2022-08-31 DIAGNOSIS — D638 Anemia in other chronic diseases classified elsewhere: Secondary | ICD-10-CM | POA: Diagnosis present

## 2022-08-31 DIAGNOSIS — E119 Type 2 diabetes mellitus without complications: Secondary | ICD-10-CM | POA: Diagnosis not present

## 2022-08-31 DIAGNOSIS — F32A Depression, unspecified: Secondary | ICD-10-CM | POA: Diagnosis not present

## 2022-08-31 DIAGNOSIS — Z9001 Acquired absence of eye: Secondary | ICD-10-CM

## 2022-08-31 DIAGNOSIS — Z66 Do not resuscitate: Secondary | ICD-10-CM | POA: Diagnosis present

## 2022-08-31 DIAGNOSIS — Z8249 Family history of ischemic heart disease and other diseases of the circulatory system: Secondary | ICD-10-CM | POA: Diagnosis not present

## 2022-08-31 DIAGNOSIS — W19XXXA Unspecified fall, initial encounter: Secondary | ICD-10-CM | POA: Diagnosis not present

## 2022-08-31 DIAGNOSIS — Z9049 Acquired absence of other specified parts of digestive tract: Secondary | ICD-10-CM

## 2022-08-31 DIAGNOSIS — Z91041 Radiographic dye allergy status: Secondary | ICD-10-CM

## 2022-08-31 DIAGNOSIS — Z853 Personal history of malignant neoplasm of breast: Secondary | ICD-10-CM

## 2022-08-31 DIAGNOSIS — Z91013 Allergy to seafood: Secondary | ICD-10-CM

## 2022-08-31 DIAGNOSIS — I10 Essential (primary) hypertension: Secondary | ICD-10-CM | POA: Diagnosis not present

## 2022-08-31 DIAGNOSIS — Z885 Allergy status to narcotic agent status: Secondary | ICD-10-CM | POA: Diagnosis not present

## 2022-08-31 DIAGNOSIS — D464 Refractory anemia, unspecified: Secondary | ICD-10-CM | POA: Diagnosis present

## 2022-08-31 DIAGNOSIS — Z79899 Other long term (current) drug therapy: Secondary | ICD-10-CM

## 2022-08-31 DIAGNOSIS — G935 Compression of brain: Secondary | ICD-10-CM | POA: Diagnosis present

## 2022-08-31 DIAGNOSIS — Z882 Allergy status to sulfonamides status: Secondary | ICD-10-CM

## 2022-08-31 DIAGNOSIS — Z950 Presence of cardiac pacemaker: Secondary | ICD-10-CM | POA: Diagnosis not present

## 2022-08-31 DIAGNOSIS — Z923 Personal history of irradiation: Secondary | ICD-10-CM

## 2022-08-31 DIAGNOSIS — I495 Sick sinus syndrome: Secondary | ICD-10-CM | POA: Diagnosis not present

## 2022-08-31 DIAGNOSIS — I480 Paroxysmal atrial fibrillation: Secondary | ICD-10-CM | POA: Diagnosis not present

## 2022-08-31 DIAGNOSIS — Z91048 Other nonmedicinal substance allergy status: Secondary | ICD-10-CM

## 2022-08-31 DIAGNOSIS — M1990 Unspecified osteoarthritis, unspecified site: Secondary | ICD-10-CM | POA: Diagnosis present

## 2022-08-31 LAB — CBC
HCT: 22.4 % — ABNORMAL LOW (ref 36.0–46.0)
Hemoglobin: 7.1 g/dL — ABNORMAL LOW (ref 12.0–15.0)
MCH: 32.7 pg (ref 26.0–34.0)
MCHC: 31.7 g/dL (ref 30.0–36.0)
MCV: 103.2 fL — ABNORMAL HIGH (ref 80.0–100.0)
Platelets: 160 10*3/uL (ref 150–400)
RBC: 2.17 MIL/uL — ABNORMAL LOW (ref 3.87–5.11)
RDW: 23 % — ABNORMAL HIGH (ref 11.5–15.5)
WBC: 4.4 10*3/uL (ref 4.0–10.5)
nRBC: 0 % (ref 0.0–0.2)

## 2022-08-31 LAB — COMPREHENSIVE METABOLIC PANEL
ALT: 10 U/L (ref 0–44)
AST: 15 U/L (ref 15–41)
Albumin: 3.1 g/dL — ABNORMAL LOW (ref 3.5–5.0)
Alkaline Phosphatase: 143 U/L — ABNORMAL HIGH (ref 38–126)
Anion gap: 7 (ref 5–15)
BUN: 19 mg/dL (ref 8–23)
CO2: 26 mmol/L (ref 22–32)
Calcium: 8.3 mg/dL — ABNORMAL LOW (ref 8.9–10.3)
Chloride: 106 mmol/L (ref 98–111)
Creatinine, Ser: 0.73 mg/dL (ref 0.44–1.00)
GFR, Estimated: >60 mL/min (ref >60)
Glucose, Bld: 116 mg/dL — ABNORMAL HIGH (ref 70–99)
Potassium: 3.1 mmol/L — ABNORMAL LOW (ref 3.5–5.1)
Sodium: 139 mmol/L (ref 135–145)
Total Bilirubin: 1 mg/dL (ref 0.3–1.2)
Total Protein: 5.7 g/dL — ABNORMAL LOW (ref 6.5–8.1)

## 2022-08-31 LAB — TROPONIN I (HIGH SENSITIVITY)
Troponin I (High Sensitivity): 4 ng/L (ref <18)
Troponin I (High Sensitivity): 5 ng/L (ref <18)

## 2022-08-31 MED ORDER — NICARDIPINE HCL IN NACL 20-0.86 MG/200ML-% IV SOLN
0.0000 mg/h | INTRAVENOUS | Status: DC
  Administered 2022-08-31: 5 mg/h via INTRAVENOUS
  Filled 2022-08-31: qty 200

## 2022-08-31 MED ORDER — ONDANSETRON HCL 4 MG/2ML IJ SOLN
4.0000 mg | Freq: Once | INTRAMUSCULAR | Status: AC
  Administered 2022-08-31: 4 mg via INTRAVENOUS
  Filled 2022-08-31: qty 2

## 2022-08-31 NOTE — ED Notes (Signed)
Pt. Was at 90% on RA. Placed patient on 2 liters [place] and brought up to 100%

## 2022-08-31 NOTE — ED Notes (Signed)
Pt with episode of n/v x1, yellow bile.  MD notified, meds ordered and given.

## 2022-08-31 NOTE — Consult Note (Signed)
Reason for Consult:sdh Referring Physician: EDP  Megan Hester is an 89 y.o. female.   HPI:  89 year old female with history of MDS sustained a fall at home. Son with her at the bedside and is the historian. He states she is not on any blood thinners. She has been a little more confused since the fall but still alert and conversing. She denies any HA, nausea vomiting or dizziness. 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    Past Surgical History:  Procedure Laterality Date   APPLICATION OF WOUND VAC Bilateral 02/01/2019   Procedure: APPLICATION OF WOUND VAC;  Surgeon: Karie Soda, MD;  Location: [facility];  Service: General;  Laterality: Bilateral;   BIOPSY  01/17/2022   Procedure: BIOPSY;  Surgeon: Charlott Rakes, MD;  Location: [facility];  Service: Gastroenterology;;   BREAST BIOPSY Right    BREAST LUMPECTOMY Right 1999   RADIATION   CARDIAC CATHETERIZATION  02/05/1999   HYPERDYNAMIC LEFT VENTRICLE WITH EF OF 75-80%   CARDIOVERSION N/A 01/22/2015   Procedure: CARDIOVERSION;  Surgeon: Lars Masson, MD;  Location: [facility];  Service: Cardiovascular;  Laterality: N/A;   CARDIOVERSION N/A 02/22/2015   Procedure: CARDIOVERSION;  Surgeon: Lewayne Bunting, MD;  Location: [facility];  Service: Cardiovascular;  Laterality: N/A;   DILATION AND CURETTAGE OF UTERUS     ENUCLEATION Right 2012   EP IMPLANTABLE DEVICE N/A 02/25/2016   Procedure: Pacemaker Implant;  Surgeon: Hillis Range, MD;  Location: [facility];  Service: Cardiovascular;  Laterality: N/A;   ESOPHAGOGASTRODUODENOSCOPY (EGD) WITH  PROPOFOL N/A 01/17/2022   Procedure: ESOPHAGOGASTRODUODENOSCOPY (EGD) WITH PROPOFOL;  Surgeon: Charlott Rakes, MD;  [facility]: [facility];  Service: Gastroenterology;  Laterality: N/A;   INSERT / REPLACE / REMOVE PACEMAKER     LAPAROSCOPIC SMALL BOWEL RESECTION N/A 02/01/2019   Procedure: LAPAROSCOPIC SMALL BOWEL RESECTION WITH TAP BLOCK;  Surgeon: Karie Soda, MD;  Location: [facility];  Service: General;  Laterality: N/A;   LAPAROSCOPY N/A 02/01/2019   Procedure: LAPAROSCOPY DIAGNOSTIC;  Surgeon: Karie Soda, MD;  Location: [facility];  Service: General;  Laterality: N/A;   LYSIS OF ADHESION N/A 02/01/2019   Procedure: LYSIS OF ADHESION;  Surgeon: Karie Soda, MD;  Location: [facility];  Service: General;  Laterality: N/A;   TONSILLECTOMY      Allergies  Allergen Reactions   Shellfish Allergy Hives, Swelling and Other (See Comments)    Shrimp and lobster only   Atropine Other (See Comments)    Causes A-FIB   Codeine Other (See Comments)    Leg pain   Doxycycline Other (See Comments)    Unknown reaction   Erythromycin Other (See Comments)    Unknown reaction   Fenoprofen Calcium Swelling   Iodinated Contrast Media Hives and Other (See Comments)   Isopto Hyoscine [Scopolamine]     Causes AFIB   Naproxen Other (See Comments)    Unknown reaction   Other Swelling and Other (See Comments)    Nutrasweet - Bleeding Naphon - swelling  Oxycodone Other (See Comments)    Depression - pt tolerates as needed    Penicillins     Has patient had a PCN reaction causing immediate rash, facial/tongue/throat swelling, SOB or lightheadedness with hypotension: unknown Has patient had a PCN reaction causing severe rash involving mucus membranes or skin necrosis: unknown Has patient had a PCN reaction that required hospitalization unknown Has patient had a PCN reaction occurring within the last 10 years: childhood If all of the above answers are "NO", then may proceed with Cephalosporin use. unknown    Sulfa Drugs Cross Reactors Swelling and Other (See Comments)    Ankle swelling    Warfarin Other (See Comments)    Bleeding from eyes   Ceclor [Cefaclor] Hives   Levaquin [Levofloxacin] Rash and Other (See Comments)    Rash & causes AFIB   Tape Rash    Use paper tape    Social History   Tobacco Use   Smoking status: Never   Smokeless tobacco: Never  Substance Use Topics   Alcohol use: Yes    Alcohol/week: 0.0 standard drinks of alcohol    Comment: 02/25/2016 "glass of wine/month"    Family History  Problem Relation Age of Onset   Hypertension Father    Heart attack Father    Parkinsonism Father    Diabetes Brother    Heart disease Brother    Lung cancer Brother    Stroke Mother      Review of Systems  Positive ROS: as above  All other systems have been reviewed and were otherwise negative with the exception of those mentioned in the HPI and as above.  Objective: Vital signs in last 24 hours: Temp:  [97.9 F (36.6 C)-98.2 F (36.8 C)] 97.9 F (36.6 C) (07/21 2254) Pulse Rate:  [65-69] 68 (07/21 2310) Resp:  [14-22] 20 (07/21 2310) BP: (113-169)/(66-99) 145/99 (07/21 2305) SpO2:  [87 %-100 %] 99 % (07/21 2310)  General Appearance: Alert, cooperative, no distress, appears stated age Head: Normocephalic, without obvious abnormality, atraumatic Eyes: PERRL, conjunctiva/corneas clear, EOM's intact, fundi benign, both eyes      Back: Symmetric, no curvature, ROM normal, no CVA tenderness Lungs: respirations unlabored Heart: Regular rate and rhythm Abdomen: Soft, non-tender, bowel sounds active all four quadrants, no masses, no organomegaly Extremities: Extremities normal, atraumatic, no cyanosis or edema Pulses: 2+ and symmetric all extremities Skin: Skin color, texture, turgor normal, no rashes or lesions  NEUROLOGIC:   Mental status: A&O x2, no aphasia, good attention span, Memory and fund of knowledge Motor Exam - grossly normal, normal tone and  bulk Sensory Exam - grossly normal Reflexes: symmetric, no pathologic reflexes, No Hoffman's, No clonus Coordination - not tested Gait - not tested Balance - not tested Cranial Nerves: I: smell Not tested  II: visual acuity  OS: na    OD: na  II: visual fields Full to confrontation  II: pupils Equal, round, reactive to light  III,VII: ptosis None  III,IV,VI: extraocular muscles  Full ROM  V: mastication Normal  V: facial light touch sensation  Normal  V,VII: corneal reflex  Present  VII: facial muscle function - upper  Normal  VII: facial muscle function - lower Normal  VIII: hearing Not tested  IX: soft palate elevation  Normal  IX,X: gag reflex Present  XI: trapezius strength  5/5  XI: sternocleidomastoid strength 5/5  XI: neck flexion strength  5/5  XII: tongue strength  Normal    Data Review Lab Results  Component  Value Date   WBC 4.4 08/31/2022   HGB 7.1 (L) 08/31/2022   HCT 22.4 (L) 08/31/2022   MCV 103.2 (H) 08/31/2022   PLT 160 08/31/2022   Lab Results  Component Value Date   NA 139 08/31/2022   K 3.1 (L) 08/31/2022   CL 106 08/31/2022   CO2 26 08/31/2022   BUN 19 08/31/2022   CREATININE 0.73 08/31/2022   GLUCOSE 116 (H) 08/31/2022   Lab Results  Component Value Date   INR 1.7 (H) 01/14/2022    Radiology: CT Cervical Spine Wo Contrast  Result Date: 08/31/2022 CLINICAL DATA:  Trauma EXAM: CT CERVICAL SPINE WITHOUT CONTRAST TECHNIQUE: Multidetector CT imaging of the cervical spine was performed without intravenous contrast. Multiplanar CT image reconstructions were also generated. RADIATION DOSE REDUCTION: This exam was performed according to the departmental dose-optimization program which includes automated exposure control, adjustment of the mA and/or kV according to patient size and/or use of iterative reconstruction technique. COMPARISON:  Cervical spine CT 09/02/2021. FINDINGS: Alignment: There is trace anterolisthesis at C3-C4 and C4-C5 which is  unchanged and favored as degenerative. Alignment is otherwise anatomic. There is straightening of normal cervical lordosis. Skull base and vertebrae: No acute fracture. No primary bone lesion or focal pathologic process. Soft tissues and spinal canal: No prevertebral fluid or swelling. No visible canal hematoma. Disc levels: There is disc space narrowing and endplate osteophyte formation most significant at C5-C6. There is no significant central canal stenosis at any level. Bilateral facet arthropathy appears stable causing mild neural foraminal stenosis at multiple levels. Upper chest: Moderate right pleural effusion. Other: The thyroid gland is diffusely heterogeneous containing multiple nodules measuring up to 7 mm. IMPRESSION: 1. No acute fracture or traumatic subluxation of the cervical spine. 2. Multilevel degenerative changes. 3. Moderate right pleural effusion. 4. Heterogeneous thyroid gland containing multiple nodules measuring up to 7 mm. Recommend clinical correlation and follow-up. Electronically Signed   By: Darliss Cheney M.D.   On: 08/31/2022 21:46   CT Head Wo Contrast  Result Date: 08/31/2022 CLINICAL DATA:  Head trauma, minor (Age >= 65y). EXAM: CT HEAD WITHOUT CONTRAST TECHNIQUE: Contiguous axial images were obtained from the base of the skull through the vertex without intravenous contrast. RADIATION DOSE REDUCTION: This exam was performed according to the departmental dose-optimization program which includes automated exposure control, adjustment of the mA and/or kV according to patient size and/or use of iterative reconstruction technique. COMPARISON:  Head CT 09/02/2021. FINDINGS: Brain: Acute left holohemispheric subdural hematoma, measuring up to 30 mm along the posterior aspect of the left frontal lobe (coronal image 42 series 5) with 9 mm of rightward midline shift (coronal image 36 series 5). Mild mass effect on the left lateral ventricle. No evidence of entrapment. Cortical gray-white  differentiation is preserved. Unchanged background of mild chronic small-vessel disease. Basilar cisterns are patent. Vascular: No hyperdense vessel or unexpected calcification. Skull: No calvarial fracture or suspicious bone lesion. Skull base is unremarkable. Sinuses/Orbits: Moderate mucosal disease in the bilateral sphenoid sinuses. Orbits are unremarkable. Other: None. IMPRESSION: Acute left holohemispheric subdural hematoma, measuring up to 30 mm along the posterior aspect of the left frontal lobe, with 9 mm of rightward midline shift. Critical Value/emergent results were called by telephone at the time of interpretation on 08/31/2022 at 9:43 pm to provider [facility] , who verbally acknowledged these results. Electronically Signed   By: Orvan Falconer M.D.   On: 08/31/2022 21:46   DG Chest Portable 1 View  Result Date:  08/31/2022 CLINICAL DATA:  Syncope EXAM: PORTABLE CHEST 1 VIEW COMPARISON:  Chest x-ray 06/30/2020 FINDINGS: The heart is enlarged, unchanged. There is a small right pleural effusion. Left-sided pacemaker is again seen. There is no lung consolidation or pneumothorax. Surgical clips are seen surrounding a calcific density overlying the right chest, unchanged. No acute fractures are seen. The bones are diffusely osteopenic. IMPRESSION: 1. Small right pleural effusion. 2. Cardiomegaly. Electronically Signed   By: Darliss Cheney M.D.   On: 08/31/2022 19:51     Assessment/Plan: 89 year old who sustained a fall at home with a head CT that shows a very large left holohemispheric SDH measuring 30mm in size size with 9mm of midline shift. Patient seems to be stable with only a little confusion. I really do not recommend surgical intervention given her age and medical history. I think that the risks outweigh the benefits at this point. Would recommend supportive care and admission to medicine at [facility]. Son seems to be in agreeance and is not eager to be aggressive at this time.  Discussed plan of care with Dr. Wynetta Emery.    Megan Hester  08/31/2022 11:15 PM

## 2022-08-31 NOTE — H&P (Signed)
History and Physical    Megan Hester ZOX:[id number] DOB: 1930-03-13 DOA: 08/31/2022  PCP: Megan Paradise, MD  Patient coming from: Home  I have personally briefly reviewed patient's old medical records in [facility]  Chief Complaint: Confusion after unwitnessed fall  HPI: Megan Hester is a 89 y.o. female with medical history significant for PAF no longer on anticoagulation, myelodysplastic syndrome with chronic anemia, SSS/PPM, HTN who presented to the ED for evaluation of confusion after an unwitnessed fall at home.  ***  ED Course  Labs/Imaging on admission: I have personally reviewed following labs and imaging studies.  Initial vitals showed BP 158/72, pulse 65, RR 18, temp 98.2 F, SpO2 96% on room air.  Labs show sodium 139, potassium 3.1, bicarb 26, BUN 19, creatinine 0.73, serum glucose 116, WBC 4.4, hemoglobin 7.1, platelets 160,000, troponin negative x 2.  CT head without contrast shows acute left holohemispheric subdural hematoma measuring up to 30 mm along the posterior aspect of the left frontal lobe with 9 mm of rightward midline shift.  CT cervical spine without contrast negative for acute fracture or traumatic subluxation.  Multilevel degenerative changes noted.  Portable chest x-ray negative for focal consolidation.  Small right pleural effusion noted.  Neurosurgery were consulted and after discussion with patient and family recommendation was against surgical intervention.  They recommended medical admission to [facility].  Patient was briefly placed on Cardene drip for BP control which was subsequently discontinued.  The hospitalist service was consulted to admit for further evaluation and management.  Review of Systems: All systems reviewed and are negative except as documented in history of present illness above.   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    Past Surgical History:  Procedure Laterality Date   APPLICATION OF WOUND VAC Bilateral 02/01/2019   Procedure: APPLICATION OF WOUND VAC;  Surgeon: Karie Soda, MD;  Location: [facility];  Service: General;  Laterality: Bilateral;   BIOPSY  01/17/2022   Procedure: BIOPSY;  Surgeon: Charlott Rakes, MD;  [facility]: [facility];  Service: Gastroenterology;;   BREAST BIOPSY Right    BREAST LUMPECTOMY Right 1999   RADIATION   CARDIAC CATHETERIZATION  02/05/1999   HYPERDYNAMIC LEFT VENTRICLE WITH EF OF 75-80%   CARDIOVERSION N/A 01/22/2015   Procedure: CARDIOVERSION;  Surgeon: Lars Masson, MD;  Location: [facility];  Service: Cardiovascular;  Laterality: N/A;   CARDIOVERSION N/A 02/22/2015   Procedure: CARDIOVERSION;  Surgeon: Lewayne Bunting, MD;  Location: [facility] ENDOSCOPY;  Service: Cardiovascular;  Laterality: N/A;   DILATION AND CURETTAGE OF UTERUS     ENUCLEATION Right 2012   EP IMPLANTABLE DEVICE N/A 02/25/2016   Procedure: Pacemaker Implant;  Surgeon: Hillis Range, MD;  Location: [facility];  Service: Cardiovascular;  Laterality: N/A;   ESOPHAGOGASTRODUODENOSCOPY (EGD) WITH PROPOFOL N/A 01/17/2022   Procedure: ESOPHAGOGASTRODUODENOSCOPY (EGD) WITH PROPOFOL;  Surgeon: Charlott Rakes, MD;  Location: [facility];  Service: Gastroenterology;  Laterality: N/A;   INSERT / REPLACE / REMOVE PACEMAKER     LAPAROSCOPIC SMALL BOWEL RESECTION N/A 02/01/2019  Procedure: LAPAROSCOPIC SMALL BOWEL RESECTION WITH TAP BLOCK;  Surgeon: Karie Soda, MD;  Location: [facility];  Service: General;  Laterality: N/A;   LAPAROSCOPY N/A 02/01/2019   Procedure: LAPAROSCOPY DIAGNOSTIC;  Surgeon: Karie Soda, MD;  [facility]: [facility];   Service: General;  Laterality: N/A;   LYSIS OF ADHESION N/A 02/01/2019   Procedure: LYSIS OF ADHESION;  Surgeon: Karie Soda, MD;  Location: [facility];  Service: General;  Laterality: N/A;   TONSILLECTOMY      Social History:  reports that she has never smoked. She has never used smokeless tobacco. She reports current alcohol use. She reports that she does not use drugs.  Allergies  Allergen Reactions   Shellfish Allergy Hives, Swelling and Other (See Comments)    Shrimp and lobster only   Atropine Other (See Comments)    Causes A-FIB   Codeine Other (See Comments)    Leg pain   Doxycycline Other (See Comments)    Unknown reaction   Erythromycin Other (See Comments)    Unknown reaction   Fenoprofen Calcium Swelling   Iodinated Contrast Media Hives and Other (See Comments)   Isopto Hyoscine [Scopolamine]     Causes AFIB   Naproxen Other (See Comments)    Unknown reaction   Other Swelling and Other (See Comments)    Nutrasweet - Bleeding Naphon - swelling   Oxycodone Other (See Comments)    Depression - pt tolerates as needed    Penicillins     Has patient had a PCN reaction causing immediate rash, facial/tongue/throat swelling, SOB or lightheadedness with hypotension: unknown Has patient had a PCN reaction causing severe rash involving mucus membranes or skin necrosis: unknown Has patient had a PCN reaction that required hospitalization unknown Has patient had a PCN reaction occurring within the last 10 years: childhood If all of the above answers are "NO", then may proceed with Cephalosporin use. unknown   Sulfa Drugs Cross Reactors Swelling and Other (See Comments)    Ankle swelling    Warfarin Other (See Comments)    Bleeding from eyes   Ceclor [Cefaclor] Hives   Levaquin [Levofloxacin] Rash and Other (See Comments)    Rash & causes AFIB   Tape Rash    Use paper tape    Family History  Problem Relation Age of Onset   Hypertension Father    Heart attack Father     Parkinsonism Father    Diabetes Brother    Heart disease Brother    Lung cancer Brother    Stroke Mother      Prior to Admission medications   Medication Sig Start Date End Date Taking? Authorizing Provider  acetaminophen (TYLENOL) 500 MG tablet Take 500 mg by mouth every 6 (six) hours as needed for moderate pain.    [provider]  amiodarone (PACERONE) 200 MG tablet Take 200 mg by mouth daily with supper.    [provider]  cetirizine (ZYRTEC) 10 MG tablet Take 10 mg by mouth every evening.    [provider]  Cholecalciferol (VITAMIN D) 1000 UNITS capsule Take 1,000 Units by mouth daily with supper.    [provider]  clotrimazole-betamethasone (LOTRISONE) cream Apply 1 application  topically 2 (two) times daily as needed (to affected areas). 12/31/20   [provider]  Glucosamine HCl POWD Take 8.5 g by mouth See admin instructions. Mix 8.5 grams (0.5 scoopful) into juice and drink once a day, as tolerated    [provider]  Hypromellose (  ARTIFICIAL TEARS OP) Place 1 drop into the left eye 3 (three) times daily as needed (for dryness).    [provider]  Melatonin 10 MG TABS Take 10 mg by mouth at bedtime.    [provider]  metoprolol succinate (TOPROL-XL) 25 MG 24 hr tablet Take 1 tablet (25 mg total) by mouth daily. 06/02/22 06/03/23  Little Ishikawa, MD  Multiple Vitamins-Minerals (PRESERVISION/LUTEIN PO) Take 1 capsule by mouth 2 (two) times daily.     [provider]  Polyethyl Glycol-Propyl Glycol (SYSTANE OP) Place 1 drop into the right eye daily as needed (for dryness).    [provider]  polyethylene glycol (MIRALAX / GLYCOLAX) packet Take 8.5 g by mouth daily as needed for moderate constipation. Mix 8.5 grams (0.5 scoopful) into suggested amount of water and drink once a day- HOLD FOR DIARRHEA    [provider]  QUEtiapine (SEROQUEL) 25 MG tablet Take 25 mg by mouth at  bedtime. 12/11/19   [provider]  sertraline (ZOLOFT) 100 MG tablet Take 100 mg by mouth at bedtime.    [provider]    Physical Exam: Vitals:   08/31/22 2240 08/31/22 2254 08/31/22 2305 08/31/22 2310  BP: (!) 149/85  (!) 145/99   Pulse: 65  69 68  Resp: 17  (!) 21 20  Temp:  97.9 F (36.6 C)    TempSrc:  Oral    SpO2: 93%  (!) 87% 99%   *** Constitutional: NAD, calm, comfortable Eyes: PERRL, lids and conjunctivae normal ENMT: Mucous membranes are moist. Posterior pharynx clear of any exudate or lesions.Normal dentition.  Neck: normal, supple, no masses. Respiratory: clear to auscultation bilaterally, no wheezing, no crackles. Normal respiratory effort. No accessory muscle use.  Cardiovascular: Regular rate and rhythm, no murmurs / rubs / gallops. No extremity edema. 2+ pedal pulses. Abdomen: no tenderness, no masses palpated. No hepatosplenomegaly. Bowel sounds positive.  Musculoskeletal: no clubbing / cyanosis. No joint deformity upper and lower extremities. Good ROM, no contractures. Normal muscle tone.  Skin: no rashes, lesions, ulcers. No induration Neurologic: CN 2-12 grossly intact. Sensation intact. Strength 5/5 in all 4.  Psychiatric: Normal judgment and insight. Alert and oriented x 3. Normal mood.   EKG: Personally reviewed. Atrial fibrillation, rate 68, RBBB.  Assessment/Plan Active Problems:   * No active hospital problems. *   *** No notes on file *** Assessment and Plan: No notes have been filed under this hospital service. Service: Hospitalist  Left holohemispheric subdural hematoma resulting in midline shift with brain compression: ***  Paroxysmal atrial fibrillation: ***  Hypertension: ***  Myelodysplastic syndrome with chronic anemia: ***  Sick sinus syndrome: S/p PPM.  Pacemaker interrogated while in the ED.  No episodes of V. tach.  2.5-hour/day in A-fib.***    DVT prophylaxis: ***  Code Status: ***  Family  Communication: ***  Disposition Plan: ***  Consults called: ***  Severity of Illness: {Observation/Inpatient:21159}  Darreld Mclean MD [facility]  If 7PM-7AM, please contact night-coverage [email or web address]  08/31/2022, 11:39 PM

## 2022-08-31 NOTE — ED Triage Notes (Signed)
EMS reports from home, Pt attempting to bend over and fell forward and struck head. No LOC, blood thinners, obvious injury or LACS noted. Family assisted Pt to sitting and hour prior to calling EMS. Family states Pt has had increased drowsiness since that time. Pt able to stand and transfer, and answering questions per normal. Hx of dementia. Pt has implanted pacemaker.  BP 150/62 HR 72 RR 16 Sp02 98 RA CBG 128

## 2022-08-31 NOTE — ED Provider Notes (Signed)
[place] EMERGENCY DEPARTMENT AT [facility] Provider Note   CSN: [id number] Arrival date & time: 08/31/22  1842     History  Chief Complaint  Patient presents with   Megan Hester is a 89 y.o. female.  Is a 89 year old female DNR present emergency department with suspected fall.  Family heard a thud in the room, patient called out.  Reportedly had some dry heaving, but no vomiting.  Son reports that she seems more subdued/somnolent than normal.  She is not on any blood thinners.  She has a history of baseline dementia, but states that she seems slightly more confused.  Patient is alert to person and knows that she is in hospital.  Does not know what year it is.  She denies any pain on exam.    Fall       Home Medications Prior to Admission medications   Medication Sig Start Date End Date Taking? Authorizing Provider  acetaminophen (TYLENOL) 500 MG tablet Take 500 mg by mouth every 6 (six) hours as needed for moderate pain.    [provider]  amiodarone (PACERONE) 200 MG tablet Take 200 mg by mouth daily with supper.    [provider]  cetirizine (ZYRTEC) 10 MG tablet Take 10 mg by mouth every evening.    [provider]  Cholecalciferol (VITAMIN D) 1000 UNITS capsule Take 1,000 Units by mouth daily with supper.    [provider]  clotrimazole-betamethasone (LOTRISONE) cream Apply 1 application  topically 2 (two) times daily as needed (to affected areas). 12/31/20   [provider]  Glucosamine HCl POWD Take 8.5 g by mouth See admin instructions. Mix 8.5 grams (0.5 scoopful) into juice and drink once a day, as tolerated    [provider]  Hypromellose (ARTIFICIAL TEARS OP) Place 1 drop into the left eye 3 (three) times daily as needed (for dryness).    [provider]  Melatonin 10 MG TABS Take 10 mg by mouth at bedtime.    [provider]  metoprolol succinate (TOPROL-XL) 25 MG  24 hr tablet Take 1 tablet (25 mg total) by mouth daily. 06/02/22 06/03/23  Little Ishikawa, MD  Multiple Vitamins-Minerals (PRESERVISION/LUTEIN PO) Take 1 capsule by mouth 2 (two) times daily.     [provider]  Polyethyl Glycol-Propyl Glycol (SYSTANE OP) Place 1 drop into the right eye daily as needed (for dryness).    [provider]  polyethylene glycol (MIRALAX / GLYCOLAX) packet Take 8.5 g by mouth daily as needed for moderate constipation. Mix 8.5 grams (0.5 scoopful) into suggested amount of water and drink once a day- HOLD FOR DIARRHEA    [provider]  QUEtiapine (SEROQUEL) 25 MG tablet Take 25 mg by mouth at bedtime. 12/11/19   [provider]  sertraline (ZOLOFT) 100 MG tablet Take 100 mg by mouth at bedtime.    [provider]      Allergies    Shellfish allergy, Atropine, Codeine, Doxycycline, Erythromycin, Fenoprofen calcium, Iodinated contrast media, Isopto hyoscine [scopolamine], Naproxen, Other, Oxycodone, Penicillins, Sulfa drugs cross reactors, Warfarin, Ceclor [cefaclor], Levaquin [levofloxacin], and Tape    Review of Systems   Review of Systems  Physical Exam Updated Vital Signs BP (!) 145/99   Pulse 68   Temp 97.9 F (36.6 C) (Oral)   Resp 20   SpO2 99%  Physical Exam Vitals and nursing note reviewed.  Constitutional:      General: She  is not in acute distress.    Appearance: She is not toxic-appearing.  HENT:     Head: Normocephalic and atraumatic.     Nose: Nose normal.     Mouth/Throat:     Mouth: Mucous membranes are moist.  Eyes:     Pupils: Pupils are equal, round, and reactive to light.     Comments: Chronic right eye  Cardiovascular:     Rate and Rhythm: Normal rate and regular rhythm.     Pulses: Normal pulses.  Pulmonary:     Effort: Pulmonary effort is normal.     Breath sounds: Normal breath sounds.  Abdominal:     General: Abdomen is flat. There is no distension.     Palpations:  Abdomen is soft.     Tenderness: There is no abdominal tenderness. There is no guarding or rebound.  Musculoskeletal:     Cervical back: Normal range of motion.     Comments: No midline spinal tenderness.  Chest stable pelvis stable.  No bony tenderness to extremities.  Skin:    General: Skin is warm and dry.     Capillary Refill: Capillary refill takes less than 2 seconds.  Neurological:     Mental Status: She is alert.     Comments: Patient with equal strength in all 4 extremities.  Normal sensation.  Coordinated movements.  Cranial nerves and EOM intact.  Psychiatric:        Mood and Affect: Mood normal.        Behavior: Behavior normal.     ED Results / Procedures / Treatments   Labs (all labs ordered are listed, but only abnormal results are displayed) Labs Reviewed  CBC - Abnormal; Notable for the following components:      Result Value   RBC 2.17 (*)    Hemoglobin 7.1 (*)    HCT 22.4 (*)    MCV 103.2 (*)    RDW 23.0 (*)    All other components within normal limits  COMPREHENSIVE METABOLIC PANEL - Abnormal; Notable for the following components:   Potassium 3.1 (*)    Glucose, Bld 116 (*)    Calcium 8.3 (*)    Total Protein 5.7 (*)    Albumin 3.1 (*)    Alkaline Phosphatase 143 (*)    All other components within normal limits  URINALYSIS, ROUTINE W REFLEX MICROSCOPIC  TROPONIN I (HIGH SENSITIVITY)  TROPONIN I (HIGH SENSITIVITY)    EKG None  Radiology CT Cervical Spine Wo Contrast  Result Date: 08/31/2022 CLINICAL DATA:  Trauma EXAM: CT CERVICAL SPINE WITHOUT CONTRAST TECHNIQUE: Multidetector CT imaging of the cervical spine was performed without intravenous contrast. Multiplanar CT image reconstructions were also generated. RADIATION DOSE REDUCTION: This exam was performed according to the departmental dose-optimization program which includes automated exposure control, adjustment of the mA and/or kV according to patient size and/or use of iterative reconstruction  technique. COMPARISON:  Cervical spine CT 09/02/2021. FINDINGS: Alignment: There is trace anterolisthesis at C3-C4 and C4-C5 which is unchanged and favored as degenerative. Alignment is otherwise anatomic. There is straightening of normal cervical lordosis. Skull base and vertebrae: No acute fracture. No primary bone lesion or focal pathologic process. Soft tissues and spinal canal: No prevertebral fluid or swelling. No visible canal hematoma. Disc levels: There is disc space narrowing and endplate osteophyte formation most significant at C5-C6. There is no significant central canal stenosis at any level. Bilateral facet arthropathy appears stable causing mild neural foraminal stenosis at multiple levels. Upper  chest: Moderate right pleural effusion. Other: The thyroid gland is diffusely heterogeneous containing multiple nodules measuring up to 7 mm. IMPRESSION: 1. No acute fracture or traumatic subluxation of the cervical spine. 2. Multilevel degenerative changes. 3. Moderate right pleural effusion. 4. Heterogeneous thyroid gland containing multiple nodules measuring up to 7 mm. Recommend clinical correlation and follow-up. Electronically Signed   By: Darliss Cheney M.D.   On: 08/31/2022 21:46   CT Head Wo Contrast  Result Date: 08/31/2022 CLINICAL DATA:  Head trauma, minor (Age >= 65y). EXAM: CT HEAD WITHOUT CONTRAST TECHNIQUE: Contiguous axial images were obtained from the base of the skull through the vertex without intravenous contrast. RADIATION DOSE REDUCTION: This exam was performed according to the departmental dose-optimization program which includes automated exposure control, adjustment of the mA and/or kV according to patient size and/or use of iterative reconstruction technique. COMPARISON:  Head CT 09/02/2021. FINDINGS: Brain: Acute left holohemispheric subdural hematoma, measuring up to 30 mm along the posterior aspect of the left frontal lobe (coronal image 42 series 5) with 9 mm of rightward  midline shift (coronal image 36 series 5). Mild mass effect on the left lateral ventricle. No evidence of entrapment. Cortical gray-white differentiation is preserved. Unchanged background of mild chronic small-vessel disease. Basilar cisterns are patent. Vascular: No hyperdense vessel or unexpected calcification. Skull: No calvarial fracture or suspicious bone lesion. Skull base is unremarkable. Sinuses/Orbits: Moderate mucosal disease in the bilateral sphenoid sinuses. Orbits are unremarkable. Other: None. IMPRESSION: Acute left holohemispheric subdural hematoma, measuring up to 30 mm along the posterior aspect of the left frontal lobe, with 9 mm of rightward midline shift. Critical Value/emergent results were called by telephone at the time of interpretation on 08/31/2022 at 9:43 pm to provider [facility] , who verbally acknowledged these results. Electronically Signed   By: Orvan Falconer M.D.   On: 08/31/2022 21:46   DG Chest Portable 1 View  Result Date: 08/31/2022 CLINICAL DATA:  Syncope EXAM: PORTABLE CHEST 1 VIEW COMPARISON:  Chest x-ray 06/30/2020 FINDINGS: The heart is enlarged, unchanged. There is a small right pleural effusion. Left-sided pacemaker is again seen. There is no lung consolidation or pneumothorax. Surgical clips are seen surrounding a calcific density overlying the right chest, unchanged. No acute fractures are seen. The bones are diffusely osteopenic. IMPRESSION: 1. Small right pleural effusion. 2. Cardiomegaly. Electronically Signed   By: Darliss Cheney M.D.   On: 08/31/2022 19:51    Procedures .Critical Care E&M  Performed by: Coral Spikes, DO Critical care provider statement:    Critical care time (minutes):  30   Critical care was necessary to treat or prevent imminent or life-threatening deterioration of the following conditions:  Trauma and CNS failure or compromise   Critical care was time spent personally by me on the following activities:  Development of treatment  plan with patient or surrogate, discussions with consultants, evaluation of patient's response to treatment, examination of patient, obtaining history from patient or surrogate, ordering and review of laboratory studies, ordering and review of radiographic studies, pulse oximetry, re-evaluation of patient's condition and review of old charts After initial E/M assessment, critical care services were subsequently performed that were exclusive of separately billable procedures or treatment.       Medications Ordered in ED Medications  nicardipine (CARDENE) 20mg  in 0.86% saline IV infusion (0.1 mg/ml) (2.5 mg/hr Intravenous Restarted 08/31/22 2308)  ondansetron (ZOFRAN) injection 4 mg (4 mg Intravenous Given 08/31/22 2009)    ED Course/ Medical Decision  Making/ A&P Clinical Course as of 08/31/22 2324  Sun Aug 31, 2022  2006 Hemoglobin(!): 7.1 Chronic anemia; appears close to baseline. [TY]  2007 Pacemaker interrogation report reviewed.  No episodes of V. tach.  2.5 hours/day in A-fib. [TY]  2021 DG Chest Portable 1 View IMPRESSION: 1. Small right pleural effusion. 2. Cardiomegaly.   [TY]  2142 Radiology called. Patient with 3 cm subdural hematoma of L hemisphere. 9mm right shift. [TY]  2152 Patient reevaluated.  She is alert and oriented.  Continues to have no focal deficits on exam.  She is a DNR, discussing goals of care with son would like to discuss case with neurosurgery, but unsure if they would want surgical intervention at this time. [TY]  2205 Consulting NSGY; awaiting callback.  [TY]  2253 Spoke with neurosurgery; recommended transfer to [facility].  PA will see patient here less a long while prior to transfer.  However, no plan for surgical intervention at this time.  Recommend blood pressure control less than 160.  Hold off on Keppra for now.  [TY]    Clinical Course User Index [TY] Coral Spikes, DO                             Medical Decision Making 89 year old  history of myelodysplastic disorder, chronic anemia dementia, paroxysmal A-fib and breast cancer presented to the emergency department after being found on the ground; presumed fall.  Blood pressure initially 158/72, nontachycardic normotensive.  Patient is alert and oriented is able to somewhat participate in conversation, but not a great historian.  She has no localizing neurodeficits on exam.  Broad workup to evaluate for syncope versus fall and evaluate for traumatic pathology.  See ED course for further MDM and disposition  Amount and/or Complexity of Data Reviewed Independent Historian:     Details: Sonnoted the patient usually can somewhat carry on a conversation with some mild confusion.  She seems somewhat more confused than normal. External Data Reviewed: notes.    Details: History of MDS, and paroxysmal A-fib with pacemaker not on anticoagulation Labs: ordered. Decision-making details documented in ED Course. Radiology: ordered. Decision-making details documented in ED Course. ECG/medicine tests: ordered.  Risk Prescription drug management. Decision regarding hospitalization.         Final Clinical Impression(s) / ED Diagnoses Final diagnoses:  Subdural hematoma [facility])    Rx / DC Orders ED Discharge Orders     None         Coral Spikes, DO 08/31/22 2324

## 2022-09-01 ENCOUNTER — Telehealth: Payer: Self-pay | Admitting: Medical Oncology

## 2022-09-01 ENCOUNTER — Encounter (HOSPITAL_COMMUNITY): Payer: Self-pay | Admitting: Internal Medicine

## 2022-09-01 DIAGNOSIS — F419 Anxiety disorder, unspecified: Secondary | ICD-10-CM | POA: Insufficient documentation

## 2022-09-01 DIAGNOSIS — Z882 Allergy status to sulfonamides status: Secondary | ICD-10-CM | POA: Diagnosis not present

## 2022-09-01 DIAGNOSIS — Z9001 Acquired absence of eye: Secondary | ICD-10-CM | POA: Diagnosis not present

## 2022-09-01 DIAGNOSIS — F32A Depression, unspecified: Secondary | ICD-10-CM | POA: Insufficient documentation

## 2022-09-01 DIAGNOSIS — Z66 Do not resuscitate: Secondary | ICD-10-CM | POA: Diagnosis present

## 2022-09-01 DIAGNOSIS — Z885 Allergy status to narcotic agent status: Secondary | ICD-10-CM | POA: Diagnosis not present

## 2022-09-01 DIAGNOSIS — I10 Essential (primary) hypertension: Secondary | ICD-10-CM | POA: Diagnosis present

## 2022-09-01 DIAGNOSIS — Z7189 Other specified counseling: Secondary | ICD-10-CM | POA: Diagnosis not present

## 2022-09-01 DIAGNOSIS — D469 Myelodysplastic syndrome, unspecified: Secondary | ICD-10-CM

## 2022-09-01 DIAGNOSIS — Z515 Encounter for palliative care: Secondary | ICD-10-CM | POA: Diagnosis not present

## 2022-09-01 DIAGNOSIS — G935 Compression of brain: Secondary | ICD-10-CM

## 2022-09-01 DIAGNOSIS — F0393 Unspecified dementia, unspecified severity, with mood disturbance: Secondary | ICD-10-CM | POA: Diagnosis present

## 2022-09-01 DIAGNOSIS — D464 Refractory anemia, unspecified: Secondary | ICD-10-CM | POA: Diagnosis present

## 2022-09-01 DIAGNOSIS — Z880 Allergy status to penicillin: Secondary | ICD-10-CM | POA: Diagnosis not present

## 2022-09-01 DIAGNOSIS — Z91041 Radiographic dye allergy status: Secondary | ICD-10-CM | POA: Diagnosis not present

## 2022-09-01 DIAGNOSIS — Z881 Allergy status to other antibiotic agents status: Secondary | ICD-10-CM | POA: Diagnosis not present

## 2022-09-01 DIAGNOSIS — S065XAA Traumatic subdural hemorrhage with loss of consciousness status unknown, initial encounter: Secondary | ICD-10-CM | POA: Diagnosis present

## 2022-09-01 DIAGNOSIS — Z950 Presence of cardiac pacemaker: Secondary | ICD-10-CM | POA: Diagnosis not present

## 2022-09-01 DIAGNOSIS — Z79899 Other long term (current) drug therapy: Secondary | ICD-10-CM | POA: Diagnosis not present

## 2022-09-01 DIAGNOSIS — D638 Anemia in other chronic diseases classified elsewhere: Secondary | ICD-10-CM | POA: Diagnosis present

## 2022-09-01 DIAGNOSIS — I480 Paroxysmal atrial fibrillation: Secondary | ICD-10-CM | POA: Diagnosis present

## 2022-09-01 DIAGNOSIS — Z91013 Allergy to seafood: Secondary | ICD-10-CM | POA: Diagnosis not present

## 2022-09-01 DIAGNOSIS — E119 Type 2 diabetes mellitus without complications: Secondary | ICD-10-CM | POA: Diagnosis present

## 2022-09-01 DIAGNOSIS — Z886 Allergy status to analgesic agent status: Secondary | ICD-10-CM | POA: Diagnosis not present

## 2022-09-01 DIAGNOSIS — I495 Sick sinus syndrome: Secondary | ICD-10-CM | POA: Diagnosis present

## 2022-09-01 DIAGNOSIS — Z8249 Family history of ischemic heart disease and other diseases of the circulatory system: Secondary | ICD-10-CM | POA: Diagnosis not present

## 2022-09-01 DIAGNOSIS — M1990 Unspecified osteoarthritis, unspecified site: Secondary | ICD-10-CM | POA: Diagnosis present

## 2022-09-01 DIAGNOSIS — W19XXXA Unspecified fall, initial encounter: Secondary | ICD-10-CM | POA: Diagnosis present

## 2022-09-01 LAB — CBC
HCT: 21.3 % — ABNORMAL LOW (ref 36.0–46.0)
Hemoglobin: 6.8 g/dL — CL (ref 12.0–15.0)
MCH: 33.2 pg (ref 26.0–34.0)
MCHC: 31.9 g/dL (ref 30.0–36.0)
MCV: 103.9 fL — ABNORMAL HIGH (ref 80.0–100.0)
Platelets: 162 10*3/uL (ref 150–400)
RBC: 2.05 MIL/uL — ABNORMAL LOW (ref 3.87–5.11)
RDW: 23 % — ABNORMAL HIGH (ref 11.5–15.5)
WBC: 5.4 10*3/uL (ref 4.0–10.5)
nRBC: 0.4 % — ABNORMAL HIGH (ref 0.0–0.2)

## 2022-09-01 LAB — HEMOGLOBIN AND HEMATOCRIT, BLOOD
HCT: 23.3 % — ABNORMAL LOW (ref 36.0–46.0)
Hemoglobin: 7.5 g/dL — ABNORMAL LOW (ref 12.0–15.0)

## 2022-09-01 LAB — BASIC METABOLIC PANEL
Anion gap: 6 (ref 5–15)
BUN: 18 mg/dL (ref 8–23)
CO2: 26 mmol/L (ref 22–32)
Calcium: 8.3 mg/dL — ABNORMAL LOW (ref 8.9–10.3)
Chloride: 106 mmol/L (ref 98–111)
Creatinine, Ser: 0.7 mg/dL (ref 0.44–1.00)
GFR, Estimated: >60 mL/min (ref >60)
Glucose, Bld: 123 mg/dL — ABNORMAL HIGH (ref 70–99)
Potassium: 3.2 mmol/L — ABNORMAL LOW (ref 3.5–5.1)
Sodium: 138 mmol/L (ref 135–145)

## 2022-09-01 LAB — TYPE AND SCREEN
ABO/RH(D): O NEG
Antibody Screen: NEGATIVE
Unit division: 0

## 2022-09-01 LAB — BPAM RBC

## 2022-09-01 LAB — PREPARE RBC (CROSSMATCH)

## 2022-09-01 MED ORDER — QUETIAPINE FUMARATE 25 MG PO TABS
25.0000 mg | ORAL_TABLET | Freq: Every day | ORAL | Status: DC
  Administered 2022-09-01 – 2022-09-02 (×2): 25 mg via ORAL
  Filled 2022-09-01 (×2): qty 1

## 2022-09-01 MED ORDER — ONDANSETRON HCL 4 MG/2ML IJ SOLN
4.0000 mg | Freq: Four times a day (QID) | INTRAMUSCULAR | Status: DC | PRN
  Administered 2022-09-01 – 2022-09-02 (×3): 4 mg via INTRAVENOUS
  Filled 2022-09-01 (×3): qty 2

## 2022-09-01 MED ORDER — ONDANSETRON HCL 4 MG PO TABS: 4.0000 mg | ORAL_TABLET | Freq: Four times a day (QID) | ORAL | Status: DC | PRN

## 2022-09-01 MED ORDER — HYDRALAZINE HCL 20 MG/ML IJ SOLN: 5.0000 mg | INTRAMUSCULAR | Status: DC | PRN

## 2022-09-01 MED ORDER — ACETAMINOPHEN 325 MG PO TABS
650.0000 mg | ORAL_TABLET | Freq: Four times a day (QID) | ORAL | Status: DC | PRN
  Administered 2022-09-01 – 2022-09-02 (×2): 650 mg via ORAL
  Filled 2022-09-01 (×2): qty 2

## 2022-09-01 MED ORDER — ACETAMINOPHEN 650 MG RE SUPP: 650.0000 mg | Freq: Four times a day (QID) | RECTAL | Status: DC | PRN

## 2022-09-01 MED ORDER — AMIODARONE HCL 200 MG PO TABS
200.0000 mg | ORAL_TABLET | Freq: Every day | ORAL | Status: DC
  Administered 2022-09-01 – 2022-09-02 (×2): 200 mg via ORAL
  Filled 2022-09-01 (×2): qty 1

## 2022-09-01 MED ORDER — SENNOSIDES-DOCUSATE SODIUM 8.6-50 MG PO TABS
1.0000 | ORAL_TABLET | Freq: Every evening | ORAL | Status: DC | PRN
  Administered 2022-09-03: 1 via ORAL
  Filled 2022-09-01: qty 1

## 2022-09-01 MED ORDER — SODIUM CHLORIDE 0.9% IV SOLUTION: Freq: Once | INTRAVENOUS | Status: AC

## 2022-09-01 MED ORDER — SODIUM CHLORIDE 0.9% FLUSH
3.0000 mL | Freq: Two times a day (BID) | INTRAVENOUS | Status: DC
  Administered 2022-09-01 – 2022-09-02 (×5): 3 mL via INTRAVENOUS

## 2022-09-01 MED ORDER — POTASSIUM CHLORIDE CRYS ER 20 MEQ PO TBCR
40.0000 meq | EXTENDED_RELEASE_TABLET | Freq: Two times a day (BID) | ORAL | Status: AC
  Administered 2022-09-01 – 2022-09-03 (×4): 40 meq via ORAL
  Filled 2022-09-01 (×4): qty 2

## 2022-09-01 MED ORDER — METOPROLOL SUCCINATE ER 25 MG PO TB24
25.0000 mg | ORAL_TABLET | Freq: Every day | ORAL | Status: DC
  Administered 2022-09-01 – 2022-09-03 (×3): 25 mg via ORAL
  Filled 2022-09-01 (×3): qty 1

## 2022-09-01 MED ORDER — SERTRALINE HCL 100 MG PO TABS
100.0000 mg | ORAL_TABLET | Freq: Every day | ORAL | Status: DC
  Administered 2022-09-01 – 2022-09-02 (×2): 100 mg via ORAL
  Filled 2022-09-01 (×2): qty 1

## 2022-09-01 NOTE — Assessment & Plan Note (Signed)
-   Continue Seroquel and Zoloft 

## 2022-09-01 NOTE — Assessment & Plan Note (Signed)
-   s/p fall at home; unclear if syncopal or mechanical and unwitnessed - per [facility]: "Acute left holohemispheric subdural hematoma, measuring up to 30 mm along the posterior aspect of the left frontal lobe, with 9 mm of rightward midline shift." - follow up further neurosurgery rec's and/or further imaging - continue NIH checks - pending evolution, may need to consider further GOC discussions and palliative involvement; for now has been confirmed DNR

## 2022-09-01 NOTE — Assessment & Plan Note (Addendum)
-   continue amio and toprol - Pacemaker interrogated while in the ED. No episodes of V. tach. 2.5-hour/day in A-fib.

## 2022-09-01 NOTE — Assessment & Plan Note (Signed)
-   due to MDS; see problem - baseline Hgb around 7.5 - 8.5 g/dL - Hgb 6.8 g/dL this morning; patient transferred to [facility] prior to blood able to be ordered and will be transfused at [facility] - follow up Hgb post transfusion and trend

## 2022-09-01 NOTE — Assessment & Plan Note (Signed)
-   follows with Dr. Arbutus Ped - receives routine B12, aranesp, and PRN PRBC

## 2022-09-01 NOTE — Progress Notes (Signed)
Subjective: Patient reports just some mild headaches  Objective: Vital signs in last 24 hours: Temp:  [97.9 F (36.6 C)-99.4 F (37.4 C)] 99.4 F (37.4 C) (07/22 1514) Pulse Rate:  [60-79] 60 (07/22 1514) Resp:  [13-25] 20 (07/22 1514) BP: (102-169)/(59-99) 157/68 (07/22 1514) SpO2:  [87 %-100 %] 100 % (07/22 1514)  Intake/Output from previous day: No intake/output data recorded. Intake/Output this shift: Total I/O In: 466.5 [I.V.:68.5; Blood:398] Out: -   Neuro: alert and oriented x2, wax and wanes on orientation  Lab Results: Lab Results  Component Value Date   WBC 5.4 09/01/2022   HGB 6.8 (LL) 09/01/2022   HCT 21.3 (L) 09/01/2022   MCV 103.9 (H) 09/01/2022   PLT 162 09/01/2022   Lab Results  Component Value Date   INR 1.7 (H) 01/14/2022   BMET Lab Results  Component Value Date   NA 138 09/01/2022   K 3.2 (L) 09/01/2022   CL 106 09/01/2022   CO2 26 09/01/2022   GLUCOSE 123 (H) 09/01/2022   BUN 18 09/01/2022   CREATININE 0.70 09/01/2022   CALCIUM 8.3 (L) 09/01/2022    Studies/Results: CT Cervical Spine Wo Contrast  Result Date: 08/31/2022 CLINICAL DATA:  Trauma EXAM: CT CERVICAL SPINE WITHOUT CONTRAST TECHNIQUE: Multidetector CT imaging of the cervical spine was performed without intravenous contrast. Multiplanar CT image reconstructions were also generated. RADIATION DOSE REDUCTION: This exam was performed according to the departmental dose-optimization program which includes automated exposure control, adjustment of the mA and/or kV according to patient size and/or use of iterative reconstruction technique. COMPARISON:  Cervical spine CT 09/02/2021. FINDINGS: Alignment: There is trace anterolisthesis at C3-C4 and C4-C5 which is unchanged and favored as degenerative. Alignment is otherwise anatomic. There is straightening of normal cervical lordosis. Skull base and vertebrae: No acute fracture. No primary bone lesion or focal pathologic process. Soft tissues and  spinal canal: No prevertebral fluid or swelling. No visible canal hematoma. Disc levels: There is disc space narrowing and endplate osteophyte formation most significant at C5-C6. There is no significant central canal stenosis at any level. Bilateral facet arthropathy appears stable causing mild neural foraminal stenosis at multiple levels. Upper chest: Moderate right pleural effusion. Other: The thyroid gland is diffusely heterogeneous containing multiple nodules measuring up to 7 mm. IMPRESSION: 1. No acute fracture or traumatic subluxation of the cervical spine. 2. Multilevel degenerative changes. 3. Moderate right pleural effusion. 4. Heterogeneous thyroid gland containing multiple nodules measuring up to 7 mm. Recommend clinical correlation and follow-up. Electronically Signed   By: Darliss Cheney M.D.   On: 08/31/2022 21:46   CT Head Wo Contrast  Result Date: 08/31/2022 CLINICAL DATA:  Head trauma, minor (Age >= 65y). EXAM: CT HEAD WITHOUT CONTRAST TECHNIQUE: Contiguous axial images were obtained from the base of the skull through the vertex without intravenous contrast. RADIATION DOSE REDUCTION: This exam was performed according to the departmental dose-optimization program which includes automated exposure control, adjustment of the mA and/or kV according to patient size and/or use of iterative reconstruction technique. COMPARISON:  Head CT 09/02/2021. FINDINGS: Brain: Acute left holohemispheric subdural hematoma, measuring up to 30 mm along the posterior aspect of the left frontal lobe (coronal image 42 series 5) with 9 mm of rightward midline shift (coronal image 36 series 5). Mild mass effect on the left lateral ventricle. No evidence of entrapment. Cortical gray-white differentiation is preserved. Unchanged background of mild chronic small-vessel disease. Basilar cisterns are patent. Vascular: No hyperdense vessel or unexpected calcification. Skull: No  calvarial fracture or suspicious bone lesion.  Skull base is unremarkable. Sinuses/Orbits: Moderate mucosal disease in the bilateral sphenoid sinuses. Orbits are unremarkable. Other: None. IMPRESSION: Acute left holohemispheric subdural hematoma, measuring up to 30 mm along the posterior aspect of the left frontal lobe, with 9 mm of rightward midline shift. Critical Value/emergent results were called by telephone at the time of interpretation on 08/31/2022 at 9:43 pm to provider [facility] , who verbally acknowledged these results. Electronically Signed   By: Orvan Falconer M.D.   On: 08/31/2022 21:46   DG Chest Portable 1 View  Result Date: 08/31/2022 CLINICAL DATA:  Syncope EXAM: PORTABLE CHEST 1 VIEW COMPARISON:  Chest x-ray 06/30/2020 FINDINGS: The heart is enlarged, unchanged. There is a small right pleural effusion. Left-sided pacemaker is again seen. There is no lung consolidation or pneumothorax. Surgical clips are seen surrounding a calcific density overlying the right chest, unchanged. No acute fractures are seen. The bones are diffusely osteopenic. IMPRESSION: 1. Small right pleural effusion. 2. Cardiomegaly. Electronically Signed   By: Darliss Cheney M.D.   On: 08/31/2022 19:51    Assessment/Plan: Pleasant 89 year old with a large left SDH after a fall yesterday. Family is still not wanting to be aggressive so currently managing her medically. No new neurosurgical recommendations   LOS: 0 days    Tiana Loft [facility] 09/01/2022, 5:53 PM

## 2022-09-01 NOTE — Assessment & Plan Note (Signed)
-   continue amio and toprol - no longer on anticoagulation

## 2022-09-01 NOTE — Telephone Encounter (Signed)
Pt is in the hospital after falling yesterday . Has subdural hematoma. She is scheduled for blood transfusion. Dtr in law asked to cancel appts this week.

## 2022-09-01 NOTE — Assessment & Plan Note (Signed)
-   continue toprol - PRN hydralazine

## 2022-09-01 NOTE — ED Notes (Signed)
Attempted to contact hospitalists. EDP made aware of critical.

## 2022-09-01 NOTE — Evaluation (Signed)
Physical Therapy Evaluation Patient Details Name: Megan Hester MRN: [id number] DOB: [birth year] Today's Date: 09/01/2022  History of Present Illness  Pt is an 89 y/o female admitted 7/21 with to the ED for evaluation of confusion after an unwitnessed fall at home.  CT head without contrast shows acute left holohemispheric subdural hematoma measuring up to 30 mm along the posterior aspect of the left frontal lobe with 9 mm of rightward midline shift.  PMHx :  PAF, myelodyspllastic syndrome, SSS s/p PPM, HTN  Clinical Impression  Pt admitted with/for SDH with midline shift post fall..  Pt currently limited functionally due to the problems listed below.  (see problems list.)  Pt will benefit from PT to maximize function and safety to be able to get home safely with available assist.          Assistance Recommended at Discharge Intermittent Supervision/Assistance  If plan is discharge home, recommend the following:  Can travel by private vehicle  A little help with walking and/or transfers;A little help with bathing/dressing/bathroom;Assistance with cooking/housework;Direct supervision/assist for medications management;Direct supervision/assist for financial management;Help with stairs or ramp for entrance        Equipment Recommendations None recommended by PT  Recommendations for Other Services       Functional Status Assessment Patient has had a recent decline in their functional status and demonstrates the ability to make significant improvements in function in a reasonable and predictable amount of time.     Precautions / Restrictions Precautions Precautions: Fall      Mobility  Bed Mobility Overal bed mobility: Needs Assistance Bed Mobility: Supine to Sit, Sit to Supine     Supine to sit: Min assist Sit to supine: Min assist   General bed mobility comments: help to boost up to R elbow and then sitting.  On return assist with LE's and more assist to boost up in  bed.    Transfers Overall transfer level: Needs assistance Equipment used: None Transfers: Sit to/from Stand Sit to Stand: Min assist           General transfer comment: cues for hand placement, stability assist    Ambulation/Gait Ambulation/Gait assistance: Min assist Gait Distance (Feet): 3 Feet (to R and Left x2 at bedside.  Limited by receiving blood.) Assistive device: 1 person hand held assist Gait Pattern/deviations: Step-to pattern, Decreased step length - right, Decreased step length - left, Decreased stride length       General Gait Details: uncoordinated steps and R/posterior lean off balance.  Stairs            Wheelchair Mobility     Tilt Bed    Modified Rankin (Stroke Patients Only) Modified Rankin (Stroke Patients Only) Pre-Morbid Rankin Score: No significant disability Modified Rankin: Moderately severe disability     Balance Overall balance assessment: Needs assistance Sitting-balance support: Single extremity supported, Bilateral upper extremity supported, Feet supported Sitting balance-Leahy Scale: Poor Sitting balance - Comments: listing posteriorly and right in sitting/standing.  Can maintain with UE assist                                     Pertinent Vitals/Pain Pain Assessment Pain Assessment: Faces Faces Pain Scale: Hurts a little bit Pain Location: head Pain Descriptors / Indicators: Aching Pain Intervention(s): Monitored during session    Home Living Family/patient expects to be discharged to:: Private residence Living Arrangements: Children Available Help at Discharge:  Family Type of Home: House Home Access: Stairs to enter Entrance Stairs-Rails: Right Entrance Stairs-Number of Steps: 6   Home Layout: Able to live on main level with bedroom/bathroom Home Equipment: Rolling Walker (2 wheels);Wheelchair - manual;BSC/3in1;Hand held shower head;Shower seat (lift chair)      Prior Function Prior Level of  Function : Needs assist             Mobility Comments: reports she is ambulatory short distances with RW ADLs Comments: reports assist from dtr in law with dressing and bathing in shower     Hand Dominance   Dominant Hand: Right    Extremity/Trunk Assessment   Upper Extremity Assessment Upper Extremity Assessment: Defer to OT evaluation    Lower Extremity Assessment Lower Extremity Assessment: Generalized weakness (no significant focal weaknessess)    Cervical / Trunk Assessment Cervical / Trunk Assessment: Kyphotic  Communication   Communication: HOH  Cognition Arousal/Alertness: Awake/alert, Lethargic Behavior During Therapy: Flat affect Overall Cognitive Status: Impaired/Different from baseline Area of Impairment: Attention, Following commands, Awareness, Problem solving, Safety/judgement                   Current Attention Level: Sustained   Following Commands: Follows one step commands inconsistently Safety/Judgement: Decreased awareness of safety, Decreased awareness of deficits Awareness: Intellectual Problem Solving: Slow processing, Decreased initiation, Difficulty sequencing, Requires verbal cues          General Comments General comments (skin integrity, edema, etc.): Pt having problems with focus.  Some dystractability/mild agitation.  Will not keep her  in.    Exercises     Assessment/Plan    PT Assessment Patient needs continued PT services  PT Problem List Decreased strength;Decreased activity tolerance;Decreased balance;Decreased mobility;Decreased coordination;Decreased safety awareness       PT Treatment Interventions Functional mobility training;Gait training;DME instruction;Balance training;Patient/family education    PT Goals (Current goals can be found in the Care Plan section)  Acute Rehab PT Goals Patient Stated Goal: I want to go home. PT Goal Formulation: With patient/family Time For Goal Achievement:  09/15/22 Potential to Achieve Goals: Good    Frequency Min 3X/week     Co-evaluation               AM-PAC PT "6 Clicks" Mobility  Outcome Measure Help needed turning from your back to your side while in a flat bed without using bedrails?: A Little Help needed moving from lying on your back to sitting on the side of a flat bed without using bedrails?: A Little Help needed moving to and from a bed to a chair (including a wheelchair)?: A Little Help needed standing up from a chair using your arms (e.g., wheelchair or bedside chair)?: A Little Help needed to walk in hospital room?: A Little Help needed climbing 3-5 steps with a railing? : A Lot 6 Click Score: 17    End of Session   Activity Tolerance: Patient tolerated treatment well Patient left: in bed;with call bell/phone within reach;with bed alarm set;with family/visitor present Nurse Communication: Mobility status PT Visit Diagnosis: Unsteadiness on feet (R26.81);Muscle weakness (generalized) (M62.81);Other symptoms and signs involving the nervous system (R29.898)    Time: 0981-1914 PT Time Calculation (min) (ACUTE ONLY): 27 min   Charges:   PT Evaluation $PT Eval Moderate Complexity: 1 Mod PT Treatments $Therapeutic Activity: 8-22 mins PT General Charges $$ ACUTE PT VISIT: 1 Visit         09/01/2022  Jacinto Halim., PT Acute Rehabilitation Services [phone]  (office)  Eliseo Gum Diana Armijo 09/01/2022, 3:41 PM

## 2022-09-01 NOTE — Progress Notes (Signed)
Progress Note    Megan Hester   WGN:[id number]  DOB: Jun 15, 1930  DOA: 08/31/2022     0 PCP: Geoffry Paradise, MD  Initial CC: fall at home  Hospital Course: [name] is a 89 y.o. female with PMH low grade MDS, anemia, HTN, macular degeneration, PAF, retinal hemorrhage s/p right eye enucleation, GERD who presented after an unwitnessed fall at home.  She resides with son and his wife.  Family had heard a sound concerning for patient falling and upon evaluation, she had fallen and hit her forehead.  This was followed by some dry heaving but no vomiting.  She developed confusion and was brought to the ER for further evaluation.  She has not been on anticoagulation in setting of her A-fib due to bleed risk and chronic anemia related to MDS.  CT head obtained in the ER showed acute left holohemispheric subdural hematoma measuring 30 mm along the posterior aspect of the left frontal lobe with 9 mm rightward midline shift. Case was discussed with neurosurgery and she was recommended for transfer to [facility] but was not considered a surgical candidate.  Interval History:  Seen in ER at [facility] this morning. She was cooperative but confused. Could not follow commands but tried (e.g. lifting legs when asked to lift arms). No distress. Thinks she's at home in bed.    Assessment and Plan: * Subdural hematoma (HCC) - s/p fall at home; unclear if syncopal or mechanical and unwitnessed - per [facility]: "Acute left holohemispheric subdural hematoma, measuring up to 30 mm along the posterior aspect of the left frontal lobe, with 9 mm of rightward midline shift." - follow up further neurosurgery rec's and/or further imaging - continue NIH checks - pending evolution, may need to consider further GOC discussions and palliative involvement; for now has been confirmed DNR  MDS (myelodysplastic syndrome) (HCC) - follows with Dr. Arbutus Ped - receives routine B12, aranesp, and PRN PRBC  Refractory  anemia (HCC) - due to MDS; see problem - baseline Hgb around 7.5 - 8.5 g/dL - Hgb 6.8 g/dL this morning; patient transferred to [facility] prior to blood able to be ordered and will be transfused at [facility] - follow up Hgb post transfusion and trend   Anxiety and depression - Continue Seroquel and Zoloft  DM (diabetes mellitus) (HCC) - last A1c 5.5 % on 01/15/22 - diet control  Cardiac pacemaker in place - Med[employer]onic [id number] Advisa DR MRI - continue amio and toprol - Pacemaker interrogated while in the ED. No episodes of V. tach. 2.5-hour/day in A-fib.   History of enucleation of right eyeball - noted; no issues  Hypertension - continue toprol - PRN hydralazine   Paroxysmal atrial fibrillation (HCC) - continue amio and toprol - no longer on anticoagulation    Old records reviewed in assessment of this patient  Antimicrobials:   DVT prophylaxis:  SCDs Start: 09/01/22 0019   Code Status:   Code Status: DNR  Mobility Assessment (Last 72 Hours)     Mobility Assessment   No documentation.           Barriers to discharge: none Disposition Plan:  Pending clinical course  Status is: Inpt  Objective: Blood pressure (!) 152/73, pulse 61, temperature 98.7 F (37.1 C), temperature source Oral, resp. rate 19, SpO2 100%.  Examination:  Physical Exam Constitutional:      General: She is not in acute distress.    Appearance: She is not ill-appearing.  HENT:     Head:  Normocephalic and atraumatic.     Mouth/Throat:     Mouth: Mucous membranes are dry.  Eyes:     Comments: Right eye enucleation noted  Cardiovascular:     Rate and Rhythm: Normal rate.  Pulmonary:     Effort: Pulmonary effort is normal. No respiratory distress.     Breath sounds: Normal breath sounds.  Abdominal:     General: Bowel sounds are normal. There is no distension.     Palpations: Abdomen is soft.     Tenderness: There is no abdominal tenderness.  Musculoskeletal:        General: Normal range of  motion.     Cervical back: Normal range of motion and neck supple.  Skin:    General: Skin is warm and dry.  Neurological:     Comments: Oriented to name and president only; thought she was at home in her bed and could not guess year. Unable to move the appropriate limbs for commands (lifted left leg when asked to lift right arm, etc) but does move all 4 extremities equally     Consultants:  Neurosurgery  Procedures:    Data Reviewed: Results for orders placed or performed during the hospital encounter of 08/31/22 (from the past 24 hour(s))  CBC     Status: Abnormal   Collection Time: 08/31/22  7:32 PM  Result Value Ref Range   WBC 4.4 4.0 - 10.5 K/uL   RBC 2.17 (L) 3.87 - 5.11 MIL/uL   Hemoglobin 7.1 (L) 12.0 - 15.0 g/dL   HCT 13.0 (L) 86.5 - 78.4 %   MCV 103.2 (H) 80.0 - 100.0 fL   MCH 32.7 26.0 - 34.0 pg   MCHC 31.7 30.0 - 36.0 g/dL   RDW 69.6 (H) 29.5 - 28.4 %   Platelets 160 150 - 400 K/uL   nRBC 0.0 0.0 - 0.2 %  Comprehensive metabolic panel     Status: Abnormal   Collection Time: 08/31/22  7:32 PM  Result Value Ref Range   Sodium 139 135 - 145 mmol/L   Potassium 3.1 (L) 3.5 - 5.1 mmol/L   Chloride 106 98 - 111 mmol/L   CO2 26 22 - 32 mmol/L   Glucose, Bld 116 (H) 70 - 99 mg/dL   BUN 19 8 - 23 mg/dL   Creatinine, Ser 1.32 0.44 - 1.00 mg/dL   Calcium 8.3 (L) 8.9 - 10.3 mg/dL   Total Protein 5.7 (L) 6.5 - 8.1 g/dL   Albumin 3.1 (L) 3.5 - 5.0 g/dL   AST 15 15 - 41 U/L   ALT 10 0 - 44 U/L   Alkaline Phosphatase 143 (H) 38 - 126 U/L   Total Bilirubin 1.0 0.3 - 1.2 mg/dL   GFR, Estimated >44 >01 mL/min   Anion gap 7 5 - 15  Troponin I (High Sensitivity)     Status: None   Collection Time: 08/31/22  7:32 PM  Result Value Ref Range   Troponin I (High Sensitivity) 4 <18 ng/L  Troponin I (High Sensitivity)     Status: None   Collection Time: 08/31/22 10:10 PM  Result Value Ref Range   Troponin I (High Sensitivity) 5 <18 ng/L  CBC     Status: Abnormal   Collection  Time: 09/01/22  4:37 AM  Result Value Ref Range   WBC 5.4 4.0 - 10.5 K/uL   RBC 2.05 (L) 3.87 - 5.11 MIL/uL   Hemoglobin 6.8 (LL) 12.0 - 15.0 g/dL   HCT 02.7 (  L) 36.0 - 46.0 %   MCV 103.9 (H) 80.0 - 100.0 fL   MCH 33.2 26.0 - 34.0 pg   MCHC 31.9 30.0 - 36.0 g/dL   RDW 10.2 (H) 72.5 - 36.6 %   Platelets 162 150 - 400 K/uL   nRBC 0.4 (H) 0.0 - 0.2 %  Basic metabolic panel     Status: Abnormal   Collection Time: 09/01/22  4:37 AM  Result Value Ref Range   Sodium 138 135 - 145 mmol/L   Potassium 3.2 (L) 3.5 - 5.1 mmol/L   Chloride 106 98 - 111 mmol/L   CO2 26 22 - 32 mmol/L   Glucose, Bld 123 (H) 70 - 99 mg/dL   BUN 18 8 - 23 mg/dL   Creatinine, Ser 4.40 0.44 - 1.00 mg/dL   Calcium 8.3 (L) 8.9 - 10.3 mg/dL   GFR, Estimated >34 >74 mL/min   Anion gap 6 5 - 15  Type and screen [facility]     Status: None (Preliminary result)   Collection Time: 09/01/22  9:55 AM  Result Value Ref Range   ABO/RH(D) O NEG    Antibody Screen NEG    Sample Expiration 09/04/2022,2359    Unit Number [id number]    Blood Component Type RED CELLS,LR    Unit division 00    Status of Unit ISSUED    Transfusion Status OK TO TRANSFUSE    Crossmatch Result      Compatible Performed at [facility], [address] [place], [place] [address]   Prepare RBC (crossmatch)     Status: None   Collection Time: 09/01/22  9:55 AM  Result Value Ref Range   Order Confirmation      ORDER PROCESSED BY BLOOD BANK Performed at [facility], [address], [place], [place] [address]     I have reviewed pertinent nursing notes, vitals, labs, and images as necessary. I have ordered labwork to follow up on as indicated.  I have reviewed the last notes from staff over past 24 hours. I have discussed patient's care plan and test results with nursing staff, CM/SW, and other staff as appropriate.  Time spent: Greater than 50% of the 55 minute visit was spent in counseling/coordination of  care for the patient as laid out in the A&P.   LOS: 0 days   Lewie Chamber, MD [facility] 09/01/2022, 1:26 PM

## 2022-09-01 NOTE — Assessment & Plan Note (Addendum)
-   last A1c 5.5 % on 01/15/22 - diet control

## 2022-09-01 NOTE — Care Plan (Signed)
Patient seen and examined.  She arrived at [facility] from [place] after admitted for 3 cm left parietal hemorrhage with minimal midline shift, nonsurgical candidate however neurosurgery wanted her to be at [facility].  Patient is complaining of some nausea but denies any other complaints.  Consented for blood transfusion.  She has myelodysplastic syndrome, her hemoglobin at baseline is about 7.5-8. See detailed progress note as done by Dr. Frederick Peers today.  Plan: 1 unit of PRBC transfusion.  PT OT.  Palliative care consultation.  May benefit with home hospice program.

## 2022-09-01 NOTE — Assessment & Plan Note (Signed)
-   noted; no issues

## 2022-09-01 NOTE — ED Notes (Signed)
Pt with another episode of vomiting.

## 2022-09-01 NOTE — Hospital Course (Addendum)
Megan Hester is a 89 y.o. female with PMH low grade MDS, anemia, HTN, macular degeneration, PAF, retinal hemorrhage s/p right eye enucleation, GERD who presented after an unwitnessed fall at home.  She resides with son and his wife.  Family had heard a sound concerning for patient falling and upon evaluation, she had fallen and hit her forehead.  This was followed by some dry heaving but no vomiting.  She developed confusion and was brought to the ER for further evaluation.  She has not been on anticoagulation in setting of her A-fib due to bleed risk and chronic anemia related to MDS.  CT head obtained in the ER showed acute left holohemispheric subdural hematoma measuring 30 mm along the posterior aspect of the left frontal lobe with 9 mm rightward midline shift. Case was discussed with neurosurgery and she was recommended for transfer to [facility] but was not considered a surgical candidate.

## 2022-09-02 DIAGNOSIS — S065XAA Traumatic subdural hemorrhage with loss of consciousness status unknown, initial encounter: Secondary | ICD-10-CM | POA: Diagnosis not present

## 2022-09-02 DIAGNOSIS — Z515 Encounter for palliative care: Secondary | ICD-10-CM | POA: Diagnosis not present

## 2022-09-02 DIAGNOSIS — Z7189 Other specified counseling: Secondary | ICD-10-CM | POA: Diagnosis not present

## 2022-09-02 LAB — TYPE AND SCREEN: Unit division: 0

## 2022-09-02 LAB — COMPREHENSIVE METABOLIC PANEL
ALT: 10 U/L (ref 0–44)
AST: 11 U/L — ABNORMAL LOW (ref 15–41)
Albumin: 2.7 g/dL — ABNORMAL LOW (ref 3.5–5.0)
Alkaline Phosphatase: 104 U/L (ref 38–126)
Anion gap: 5 (ref 5–15)
BUN: 18 mg/dL (ref 8–23)
CO2: 25 mmol/L (ref 22–32)
Calcium: 8.4 mg/dL — ABNORMAL LOW (ref 8.9–10.3)
Chloride: 108 mmol/L (ref 98–111)
Creatinine, Ser: 0.79 mg/dL (ref 0.44–1.00)
GFR, Estimated: >60 mL/min (ref >60)
Glucose, Bld: 103 mg/dL — ABNORMAL HIGH (ref 70–99)
Potassium: 4 mmol/L (ref 3.5–5.1)
Sodium: 138 mmol/L (ref 135–145)
Total Bilirubin: 1.4 mg/dL — ABNORMAL HIGH (ref 0.3–1.2)
Total Protein: 5.3 g/dL — ABNORMAL LOW (ref 6.5–8.1)

## 2022-09-02 LAB — BPAM RBC
Blood Product Expiration Date: 202407292359
Blood Product Expiration Date: 202407312359
Unit Type and Rh: 9500

## 2022-09-02 LAB — URINALYSIS, ROUTINE W REFLEX MICROSCOPIC
Bilirubin Urine: NEGATIVE
Glucose, UA: NEGATIVE mg/dL
Hgb urine dipstick: NEGATIVE
Ketones, ur: NEGATIVE mg/dL
Nitrite: NEGATIVE
Protein, ur: 30 mg/dL — AB
Specific Gravity, Urine: 1.021 (ref 1.005–1.030)
pH: 5 (ref 5.0–8.0)

## 2022-09-02 LAB — MAGNESIUM: Magnesium: 1.9 mg/dL (ref 1.7–2.4)

## 2022-09-02 LAB — CBC WITH DIFFERENTIAL/PLATELET
Abs Immature Granulocytes: 0.01 10*3/uL (ref 0.00–0.07)
Basophils Absolute: 0 10*3/uL (ref 0.0–0.1)
Basophils Relative: 0 %
Eosinophils Absolute: 0.2 10*3/uL (ref 0.0–0.5)
Eosinophils Relative: 3 %
HCT: 21.6 % — ABNORMAL LOW (ref 36.0–46.0)
Hemoglobin: 6.9 g/dL — CL (ref 12.0–15.0)
Immature Granulocytes: 0 %
Lymphocytes Relative: 25 %
Lymphs Abs: 1.1 10*3/uL (ref 0.7–4.0)
MCH: 31.9 pg (ref 26.0–34.0)
MCHC: 31.9 g/dL (ref 30.0–36.0)
MCV: 100 fL (ref 80.0–100.0)
Monocytes Absolute: 0.5 10*3/uL (ref 0.1–1.0)
Monocytes Relative: 10 %
Neutro Abs: 2.7 10*3/uL (ref 1.7–7.7)
Neutrophils Relative %: 62 %
Platelets: 138 10*3/uL — ABNORMAL LOW (ref 150–400)
RBC: 2.16 MIL/uL — ABNORMAL LOW (ref 3.87–5.11)
RDW: 22.3 % — ABNORMAL HIGH (ref 11.5–15.5)
WBC: 4.5 10*3/uL (ref 4.0–10.5)
nRBC: 0 % (ref 0.0–0.2)

## 2022-09-02 LAB — PHOSPHORUS: Phosphorus: 2.9 mg/dL (ref 2.5–4.6)

## 2022-09-02 LAB — HEMOGLOBIN AND HEMATOCRIT, BLOOD
HCT: 26.5 % — ABNORMAL LOW (ref 36.0–46.0)
Hemoglobin: 8.8 g/dL — ABNORMAL LOW (ref 12.0–15.0)

## 2022-09-02 LAB — PREPARE RBC (CROSSMATCH)

## 2022-09-02 MED ORDER — SODIUM CHLORIDE 0.9% IV SOLUTION: Freq: Once | INTRAVENOUS | Status: AC

## 2022-09-02 NOTE — Progress Notes (Signed)
Physical Therapy Treatment Patient Details Name: Megan Hester MRN: [id number] DOB: [birth year] Today's Date: 09/02/2022   History of Present Illness Pt is an 89 y/o female admitted 7/21 with to the ED for evaluation of confusion after an unwitnessed fall at home.  CT head without contrast shows acute left holohemispheric subdural hematoma measuring up to 30 mm along the posterior aspect of the left frontal lobe with 9 mm of rightward midline shift.  PMHx :  PAF, myelodyspllastic syndrome, SSS s/p PPM, HTN    PT Comments  Pt greeted up in chair and agreeable to session with good progress towards acute goals, however pt continues to be limited by poor balance/postural reactions, decreased activity tolerance, visual deficits and weakness. Pt able to progress gait with RW for support and up to min A to steady as pt with R drift in hall and difficulty negotiating around obstacles, suspect due to baseline visual deficits. Pt demonstrating functional transfers with min guard for safety with and without AD. Pt was educated on continued walker use to maximize functional independence, safety, and decrease risk for falls. Current plan remains appropriate to address deficits and maximize functional independence and decrease caregiver burden. Pt continues to benefit from skilled PT services to progress toward functional mobility goals.      Assistance Recommended at Discharge Intermittent Supervision/Assistance  If plan is discharge home, recommend the following:  Can travel by private vehicle    A little help with walking and/or transfers;A little help with bathing/dressing/bathroom;Assistance with cooking/housework;Direct supervision/assist for medications management;Direct supervision/assist for financial management;Help with stairs or ramp for entrance      Equipment Recommendations  None recommended by PT    Recommendations for Other Services       Precautions / Restrictions  Precautions Precautions: Fall     Mobility  Bed Mobility Overal bed mobility: Needs Assistance Bed Mobility: Sit to Supine       Sit to supine: Min assist   General bed mobility comments: light assist to return BLEs to bed and reposition in center    Transfers Overall transfer level: Needs assistance Equipment used: None Transfers: Sit to/from Stand Sit to Stand: Min guard           General transfer comment: steady rise without AD    Ambulation/Gait Ambulation/Gait assistance: Min assist Gait Distance (Feet): 175 Feet Assistive device: Rolling walker (2 wheels) Gait Pattern/deviations: Decreased step length - right, Decreased step length - left, Decreased stride length, Step-through pattern, Drifts right/left Gait velocity: decr     General Gait Details: pt with R lean and R drift with pt able to correct with cues, pt with increased and more frequent R drift with increasd fatigue, anticipate pt will do better in familiar environment as pt legall blind, cues throughout for RW proximity   Stairs             Wheelchair Mobility     Tilt Bed    Modified Rankin (Stroke Patients Only) Modified Rankin (Stroke Patients Only) Pre-Morbid Rankin Score: No significant disability Modified Rankin: Moderate disability     Balance Overall balance assessment: Needs assistance Sitting-balance support: Bilateral upper extremity supported, Feet supported Sitting balance-Leahy Scale: Fair     Standing balance support: Bilateral upper extremity supported, During functional activity, Reliant on assistive device for balance Standing balance-Leahy Scale: Poor Standing balance comment: BUE support for dynamic tasks  Cognition Arousal/Alertness: Awake/alert, Lethargic Behavior During Therapy: Flat affect Overall Cognitive Status: Impaired/Different from baseline Area of Impairment: Attention, Safety/judgement, Awareness, Problem  solving                   Current Attention Level: Sustained   Following Commands: Follows one step commands inconsistently Safety/Judgement: Decreased awareness of safety, Decreased awareness of deficits Awareness: Emergent Problem Solving: Slow processing, Difficulty sequencing, Requires verbal cues, Requires tactile cues General Comments: pt needs redirection during session.        Exercises      General Comments General comments (skin integrity, edema, etc.): VSS on RA      Pertinent Vitals/Pain Pain Assessment Pain Assessment: No/denies pain    Home Living                          Prior Function            PT Goals (current goals can now be found in the care plan section) Acute Rehab PT Goals PT Goal Formulation: With patient/family Time For Goal Achievement: 09/15/22 Progress towards PT goals: Progressing toward goals    Frequency    Min 3X/week      PT Plan Current plan remains appropriate    Co-evaluation              AM-PAC PT "6 Clicks" Mobility   Outcome Measure  Help needed turning from your back to your side while in a flat bed without using bedrails?: A Little Help needed moving from lying on your back to sitting on the side of a flat bed without using bedrails?: A Little Help needed moving to and from a bed to a chair (including a wheelchair)?: A Little Help needed standing up from a chair using your arms (e.g., wheelchair or bedside chair)?: A Little Help needed to walk in hospital room?: A Little Help needed climbing 3-5 steps with a railing? : A Lot 6 Click Score: 17    End of Session Equipment Utilized During Treatment: Gait belt Activity Tolerance: Patient tolerated treatment well Patient left: in bed;with call bell/phone within reach;with bed alarm set;with family/visitor present Nurse Communication: Mobility status PT Visit Diagnosis: Unsteadiness on feet (R26.81);Muscle weakness (generalized) (M62.81);Other  symptoms and signs involving the nervous system (R29.898)     Time: 1445-1510 PT Time Calculation (min) (ACUTE ONLY): 25 min  Charges:    $Gait Training: 23-37 mins PT General Charges $$ ACUTE PT VISIT: 1 Visit                     Kajuana Shareef R. PTA Acute Rehabilitation Services Office: [phone]   Catalina Antigua 09/02/2022, 4:08 PM

## 2022-09-02 NOTE — Consult Note (Signed)
Consultation Note Date: 09/02/2022   Patient Name: Megan Hester  DOB: 11-13-1930  MRN: [id number]  Age / Sex: 89 y.o., female  PCP: Megan Paradise, MD Referring Physician: Dorcas Carrow, MD  Reason for Consultation: Establishing goals of care  HPI/Patient Profile: 89 y.o. female  with past medical history of paroxysmal atrial fibrillation no longer on anticoagulation, myelodysplastic syndrome with chronic anemia, SSS s/p PPM, HTN admitted on 08/31/2022 with confusion after fall at home due to SDH.   Clinical Assessment and Goals of Care: Consult received and chart review completed. Reviewed palliative notes. Dr. Jerral Hester has had conversation with family and they wish to continue blood transfusions at this time. I met today with Megan Hester, daughter Megan Hester, son/HCPOA Megan Hester (first HCPOA was Megan Hester but this was her husband who is deceased). Megan Hester lives with son and daughter-in-law, Megan Hester and Megan Hester. They report no significant concerns. She is doing well considering her significant injury. Appetite is not great but they are making this work. She is having more issues with recall and memory and hopes that this improves with time after she returns back home.   She is in good spirits and without pain or discomfort. Family are able to have good times and enjoy time together. At this time they agree with ongoing conservative treatment and continued blood transfusions. They have good understanding of what is good quality of life for Megan Hester and when her quality of life changes and declines they will revisit their wishes for transfusion and interventions. I provided them with Hester form to review and to complete in the future when appropriate.   All questions/concerns addressed. Emotional support provided.   Primary Decision Maker PATIENT    SUMMARY OF RECOMMENDATIONS   - DNR in place - Continue  conservative care and blood transfusion - They are open to more comfort measures WHEN quality of life declines but she has good quality of life at this time  Code Status/Advance Care Planning: DNR   Symptom Management:  Per attending.  Prognosis:  Unable to determine  Discharge Planning: Home with Palliative Services      Primary Diagnoses: Present on Admission:  Subdural hematoma (HCC)  Cardiac pacemaker in place - Medtronic [id number] Advisa DR MRI  MDS (myelodysplastic syndrome) (HCC)  Hypertension  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HCC)   I have reviewed the medical record, interviewed the patient and family, and examined the patient. The following aspects are pertinent.  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   Social History   Socioeconomic History   Marital status: Married    Spouse name: Not on file  Number of children: 3   Years of education: Not on file   Highest education level: Not on file  Occupational History    Employer: RETIRED  Tobacco Use   Smoking status: Never   Smokeless tobacco: Never  Vaping Use   Vaping status: Never Used  Substance and Sexual Activity   Alcohol use: Yes    Alcohol/week: 0.0 standard drinks of alcohol    Comment: 02/25/2016 "glass of wine/month"   Drug use: No   Sexual activity: Yes    Birth control/protection: Post-menopausal    Comment: 1st intercourse 7 yo-1 partner  Other Topics Concern   Not on file  Social History Narrative   Lives in [place].  Retired Architect.   Social Determinants of Health   Financial Resource Strain: Not on file  Food Insecurity: No Food Insecurity  (01/15/2022)   Hunger Vital Sign    Worried About Running Out of Food in the Last Year: Never true    Ran Out of Food in the Last Year: Never true  Transportation Needs: No Transportation Needs (01/15/2022)   PRAPARE - Administrator, Civil Service (Medical): No    Lack of Transportation (Non-Medical): No  Physical Activity: Not on file  Stress: Not on file  Social Connections: Not on file   Family History  Problem Relation Age of Onset   Hypertension Father    Heart attack Father    Parkinsonism Father    Diabetes Brother    Heart disease Brother    Lung cancer Brother    Stroke Mother    Scheduled Meds:  amiodarone  200 mg Oral Q supper   metoprolol succinate  25 mg Oral Daily   potassium chloride  40 mEq Oral BID   QUEtiapine  25 mg Oral QHS   sertraline  100 mg Oral QHS   sodium chloride flush  3 mL Intravenous Q12H   Continuous Infusions: PRN Meds:.acetaminophen **OR** acetaminophen, hydrALAZINE, ondansetron **OR** ondansetron (ZOFRAN) IV, senna-docusate Allergies  Allergen Reactions   Shellfish Allergy Hives, Swelling and Other (See Comments)    Shrimp and lobster only   Atropine Other (See Comments)    Causes A-FIB   Codeine Other (See Comments)    Leg pain   Doxycycline Other (See Comments)    Unknown reaction   Erythromycin Other (See Comments)    Unknown reaction   Fenoprofen Calcium Swelling   Iodinated Contrast Media Hives and Other (See Comments)   Isopto Hyoscine [Scopolamine]     Causes AFIB   Naproxen Other (See Comments)    Unknown reaction   Other Swelling and Other (See Comments)    Nutrasweet - Bleeding Naphon - swelling   Oxycodone Other (See Comments)    Depression - pt tolerates as needed    Penicillins     Has patient had a PCN reaction causing immediate rash, facial/tongue/throat swelling, SOB or lightheadedness with hypotension: unknown Has patient had a PCN reaction causing severe rash involving mucus membranes or skin  necrosis: unknown Has patient had a PCN reaction that required hospitalization unknown Has patient had a PCN reaction occurring within the last 10 years: childhood If all of the above answers are "NO", then may proceed with Cephalosporin use. unknown   Sulfa Drugs Cross Reactors Swelling and Other (See Comments)    Ankle swelling    Warfarin Other (See Comments)    Bleeding from eyes   Ceclor [Cefaclor] Hives   Levaquin [Levofloxacin] Rash and Other (See Comments)  Rash & causes AFIB   Tape Rash    Use paper tape   Review of Systems  Constitutional:  Positive for activity change, appetite change and fatigue.  Respiratory:  Negative for shortness of breath.     Physical Exam Vitals and nursing note reviewed.  Constitutional:      General: She is not in acute distress.    Appearance: She is ill-appearing.  Cardiovascular:     Rate and Rhythm: Bradycardia present.  Pulmonary:     Effort: No tachypnea, accessory muscle usage or respiratory distress.  Abdominal:     Palpations: Abdomen is soft.  Neurological:     Mental Status: She is alert.     Comments: Poor recall; forgetful     Vital Signs: BP (!) 142/73 (BP Location: Left Arm)   Pulse (!) 59   Temp 98.1 F (36.7 C) (Oral)   Resp 16   SpO2 99%  Pain Scale: 0-10   Pain Score: 0-No pain   SpO2: SpO2: 99 % O2 Device:SpO2: 99 % O2 Flow Rate: .O2 Flow Rate (L/min): 2 L/min  IO: Intake/output summary:  Intake/Output Summary (Last 24 hours) at 09/02/2022 1239 Last data filed at 09/02/2022 4098 Gross per 24 hour  Intake 437.47 ml  Output 200 ml  Net 237.47 ml    LBM: Last BM Date : 08/31/22 Baseline Weight:   Hester recent weight:       Palliative Assessment/Data:     Time Total: 55 min  Greater than 50%  of this time was spent counseling and coordinating care related to the above assessment and plan.  Signed by: Yong Channel, NP Palliative Medicine Team Pager # [phone] (M-F 8a-5p) Team Phone #  [phone] (Nights/Weekends)

## 2022-09-02 NOTE — Progress Notes (Addendum)
Pt assisted unto the BSC to void, refused to void, said do not feel like it, assisted back to bed, Bladder scan done, scanned , pt cleaned up and made comfortable in bed, will continue to monitor. Obasogie-Asidi, Elnathan Fulford Efe  Pt more awake, assisted unto the BSC, Voided about of tea colored urine, pt cleaned and put back to bed. Obasogie-Asidi, Richa Shor Efe

## 2022-09-02 NOTE — Progress Notes (Signed)
Progress Note    Megan Hester   ZOX:[id number]  DOB: 28-Oct-1930  DOA: 08/31/2022     1 PCP: Geoffry Paradise, MD  Initial CC: fall at home  Hospital Course: Megan Hester is a 89 y.o. female with PMH low grade MDS, anemia, HTN, macular degeneration, PAF, retinal hemorrhage s/p right eye enucleation, GERD who presented after an unwitnessed fall at home.  She resides with son and his wife.  Family had heard a sound concerning for patient falling and upon evaluation, she had fallen and hit her forehead.  This was followed by some dry heaving but no vomiting.  She developed confusion and was brought to the ER for further evaluation.  She has not been on anticoagulation in setting of her A-fib due to bleed risk and chronic anemia related to MDS.  CT head obtained in the ER showed acute left holohemispheric subdural hematoma measuring 30 mm along the posterior aspect of the left frontal lobe with 9 mm rightward midline shift. Case was discussed with neurosurgery and she was recommended for transfer to [facility] but was not considered a surgical candidate.  Interval History:   Patient seen and examined.  No overnight events.  She denies any nausea vomiting today.  Denies any headache.  Hemoglobin 6.9, 1 additional unit of PRBC ordered.  Patient and family consented. Discussed with patient's son on the phone, see goal of care discussion below.   Assessment and Plan: * Subdural hematoma (HCC), traumatic - s/p fall at home; unclear if syncopal or mechanical and unwitnessed - per [facility]: "Acute left holohemispheric subdural hematoma, measuring up to 30 mm along the posterior aspect of the left frontal lobe, with 9 mm of rightward midline shift." -Followed by neurosurgery, conservative management. - continue NIH checks -No anticoagulation.  MDS (myelodysplastic syndrome) (HCC) - follows with Dr. 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 - baseline Hgb around 7.5 - 8.5 g/dL - Hgb 6.8 -1 unit PRBC -6.9.  Will transfuse another unit PRBC today to keep hemoglobin more than 7.    Anxiety and depression - Continue Seroquel and Zoloft  DM (diabetes mellitus) (HCC) - last A1c 5.5 % on 01/15/22 - diet control  Cardiac pacemaker in place - Medtronic A2DR01 Advisa DR MRI - continue amio and toprol - Pacemaker interrogated while in the ED. No episodes of V. tach. 2.5-hour/day in A-fib.   Hypertension - continue toprol - PRN hydralazine   Paroxysmal atrial fibrillation (HCC) - continue amio and toprol - no longer on anticoagulation    Goal of care: Discussed with patient's son on the phone.  Patient remains in poor clinical status, however she is very well taken care of at home and has adequate support system.  Patient still enjoys family.  We discussed about discharge disposition, additional help needed at home.  Also discussed about palliation and hospice. Since patient is still enjoying family and has good support system at home, family continues to want for her to get blood transfusions as needed but wants to avoid any surgical intervention or aggressive measures.  If patient continues to accept receiving blood transfusions and enjoys quality time with family, she can still receive blood transfusions.  In this case she will be followed by palliative care at home.  Not appropriate for hospice at this time because still accepting blood transfusions.  Anticipate home tomorrow with palliative care.  Antimicrobials: None   DVT prophylaxis:  SCDs Start: 09/01/22 0019   Code Status:   Code Status: DNR  Mobility Assessment (Last 72 Hours)     Mobility Assessment     Row Name 09/01/22 2041 09/01/22 1527         Does patient have an order for bedrest or is patient medically unstable No - Continue assessment --      What is the highest level of mobility based on the progressive mobility  assessment? Level 4 (Walks with assist in room) - Balance while marching in place and cannot step forward and back - Complete Level 3 (Stands with assist) - Balance while standing  and cannot march in place               Barriers to discharge: none Disposition Plan: Home with outpatient palliative care tomorrow. Status is: Inpt  Objective: Blood pressure (!) 160/69, pulse 63, temperature 98.2 F (36.8 C), temperature source Oral, resp. rate 16, SpO2 99%.  Examination:   General: Chronically sick looking.  Pale.  Not in any distress.  On room air. Alert awake.  Poor historian.  Mostly oriented. Enucleated right eye. Cardiovascular: S1-S2 normal.  Pacemaker left precordium. Respiratory: Bilateral clear.  No added sounds. Gastrointestinal: Soft.  Nontender.  Bowel sound present. Ext: No swelling or edema.  No cyanosis. Neuro: Alert and awake.  Delayed response, however fairly comfortable and interactive. Musculoskeletal: No deformities.   Consultants:  Neurosurgery  Procedures:  None  Data Reviewed: Results for orders placed or performed during the hospital encounter of 08/31/22 (from the past 24 hour(s))  Hemoglobin and hematocrit, blood     Status: Abnormal   Collection Time: 09/01/22  7:19 PM  Result Value Ref Range   Hemoglobin 7.5 (L) 12.0 - 15.0 g/dL   HCT 16.1 (L) 09.6 - 04.5 %  CBC with Differential/Platelet     Status: Abnormal   Collection Time: 09/02/22 12:14 AM  Result Value Ref Range   WBC 4.5 4.0 - 10.5 K/uL   RBC 2.16 (L) 3.87 - 5.11 MIL/uL   Hemoglobin 6.9 (LL) 12.0 - 15.0 g/dL   HCT 40.9 (L) 81.1 - 91.4 %   MCV 100.0 80.0 - 100.0 fL   MCH 31.9 26.0 - 34.0 pg   MCHC 31.9 30.0 - 36.0 g/dL   RDW 78.2 (H) 95.6 - 21.3 %   Platelets 138 (L) 150 - 400 K/uL   nRBC 0.0 0.0 - 0.2 %   Neutrophils Relative % 62 %   Neutro Abs 2.7 1.7 - 7.7 K/uL   Lymphocytes Relative 25 %   Lymphs Abs 1.1 0.7 - 4.0 K/uL   Monocytes Relative 10 %   Monocytes Absolute 0.5 0.1  - 1.0 K/uL   Eosinophils Relative 3 %   Eosinophils Absolute 0.2 0.0 - 0.5 K/uL   Basophils Relative 0 %   Basophils Absolute 0.0 0.0 - 0.1 K/uL   Immature Granulocytes 0 %   Abs Immature Granulocytes 0.01 0.00 - 0.07 K/uL  Magnesium     Status: None   Collection Time: 09/02/22 12:14 AM  Result Value Ref Range   Magnesium 1.9 1.7 - 2.4 mg/dL  Comprehensive metabolic panel     Status: Abnormal   Collection Time: 09/02/22 12:14 AM  Result Value Ref Range   Sodium 138 135 - 145 mmol/L   Potassium 4.0 3.5 - 5.1 mmol/L   Chloride 108 98 - 111 mmol/L   CO2 25 22 - 32 mmol/L   Glucose, Bld 103 (H) 70 -  99 mg/dL   BUN 18 8 - 23 mg/dL   Creatinine, Ser 2.84 0.44 - 1.00 mg/dL   Calcium 8.4 (L) 8.9 - 10.3 mg/dL   Total Protein 5.3 (L) 6.5 - 8.1 g/dL   Albumin 2.7 (L) 3.5 - 5.0 g/dL   AST 11 (L) 15 - 41 U/L   ALT 10 0 - 44 U/L   Alkaline Phosphatase 104 38 - 126 U/L   Total Bilirubin 1.4 (H) 0.3 - 1.2 mg/dL   GFR, Estimated >13 >24 mL/min   Anion gap 5 5 - 15  Phosphorus     Status: None   Collection Time: 09/02/22 12:14 AM  Result Value Ref Range   Phosphorus 2.9 2.5 - 4.6 mg/dL  Urinalysis, Routine w reflex microscopic -Urine, Clean Catch     Status: Abnormal   Collection Time: 09/02/22  7:27 AM  Result Value Ref Range   Color, Urine AMBER (A) YELLOW   APPearance HAZY (A) CLEAR   Specific Gravity, Urine 1.021 1.005 - 1.030   pH 5.0 5.0 - 8.0   Glucose, UA NEGATIVE NEGATIVE mg/dL   Hgb urine dipstick NEGATIVE NEGATIVE   Bilirubin Urine NEGATIVE NEGATIVE   Ketones, ur NEGATIVE NEGATIVE mg/dL   Protein, ur 30 (A) NEGATIVE mg/dL   Nitrite NEGATIVE NEGATIVE   Leukocytes,Ua SMALL (A) NEGATIVE   RBC / HPF 0-5 0 - 5 RBC/hpf   WBC, UA 11-20 0 - 5 WBC/hpf   Bacteria, UA RARE (A) NONE SEEN   Squamous Epithelial / HPF 0-5 0 - 5 /HPF   Mucus PRESENT    Ca Oxalate Crys, UA PRESENT    Non Squamous Epithelial 0-5 (A) NONE SEEN  Prepare RBC (crossmatch)     Status: None   Collection  Time: 09/02/22  8:00 AM  Result Value Ref Range   Order Confirmation      ORDER PROCESSED BY BLOOD BANK Performed at [facility], [address] [place], [place] [address]      Time spent: 40 minutes   LOS: 1 day   Dorcas Carrow, MD [facility] 09/02/2022, 10:34 AM

## 2022-09-02 NOTE — TOC CAGE-AID Note (Signed)
Transition of Care [facility]) - CAGE-AID Screening   Patient Details  Name: Megan Hester MRN: [id number] Date of Birth: 14-Oct-1930  [facility]) CM/SW Contact:    Janora Norlander, RN Phone Number: [phone] 09/02/2022, 2:40 PM   Clinical Narrative: Pt here after sustaining a SDH status post fall.  Pt is somewhat oriented but has some decline in mental status.  Pt denies alcohol or drug use.  Screening complete.   CAGE-AID Screening:    Have You Ever Felt You Ought to Cut Down on Your Drinking or Drug Use?: No Have People Annoyed You By Critizing Your Drinking Or Drug Use?: No Have You Felt Bad Or Guilty About Your Drinking Or Drug Use?: No Have You Ever Had a Drink or Used Drugs First Thing In The Morning to Steady Your Nerves or to Get Rid of a Hangover?: No CAGE-AID Score: 0  Substance Abuse Education Offered: No

## 2022-09-02 NOTE — Progress Notes (Signed)
   09/02/22 0100  Provider Notification  Provider Name/Title Dr Imogene Burn  Date Provider Notified 09/02/22  Time Provider Notified 0100  Method of Notification Page  Notification Reason Critical Result  Test performed and critical result Hemoglobin 6.9  Date Critical Result Received 09/02/22  Time Critical Result Received 0100  Provider response Other (Comment) (awaitng response)   No new orders at this time

## 2022-09-02 NOTE — Evaluation (Addendum)
Occupational Therapy Evaluation Patient Details Name: Megan Hester MRN: [id number] DOB: Apr 13, 1930 Today's Date: 09/02/2022   History of Present Illness Pt is an 89 y/o female admitted 7/21 with to the ED for evaluation of confusion after an unwitnessed fall at home.  CT head without contrast shows acute left holohemispheric subdural hematoma measuring up to 30 mm along the posterior aspect of the left frontal lobe with 9 mm of rightward midline shift.  PMHx :  PAF, myelodyspllastic syndrome, SSS s/p PPM, HTN   Clinical Impression   PT admitted with s/p fall with CHI L holohemispheric SDH. Pt currently with functional limitiations due to the deficits listed below (see OT problem list). Pt at baseline with (A) with adls from family using 4WW and baseline visual deficits. Pt has a posterior LOB with bil UE use at sink level and requires min (A) for balance.  Pt will benefit from skilled OT to increase their independence and safety with adls and balance to allow discharge HHOT.    Pt goes by "Megan Hester" her middle name   Recommendations for follow up therapy are one component of a multi-disciplinary discharge planning process, led by the attending physician.  Recommendations may be updated based on patient status, additional functional criteria and insurance authorization.   Assistance Recommended at Discharge Intermittent Supervision/Assistance  Patient can return home with the following A little help with walking and/or transfers;A little help with bathing/dressing/bathroom;Assistance with feeding;Assistance with cooking/housework    Functional Status Assessment  Patient has had a recent decline in their functional status and demonstrates the ability to make significant improvements in function in a reasonable and predictable amount of time.  Equipment Recommendations  BSC/3in1    Recommendations for Other Services       Precautions / Restrictions Precautions Precautions: Fall       Mobility Bed Mobility Overal bed mobility: Needs Assistance Bed Mobility: Supine to Sit     Supine to sit: Min assist     General bed mobility comments: pad used to help facilitate the patient activating and direction of the transfer due to visual deficits. pt initially attempting to reach to her Right despite cues for L side exit. the pad helping direct her shoulders helps her progress    Transfers Overall transfer level: Needs assistance   Transfers: Sit to/from Stand Sit to Stand: Min assist           General transfer comment: good hand placement and power up      Balance Overall balance assessment: Needs assistance Sitting-balance support: Bilateral upper extremity supported, Feet supported Sitting balance-Leahy Scale: Fair     Standing balance support: Bilateral upper extremity supported, During functional activity, Reliant on assistive device for balance Standing balance-Leahy Scale: Poor                             ADL either performed or assessed with clinical judgement   ADL Overall ADL's : Needs assistance/impaired Eating/Feeding: Minimal assistance;Sitting Eating/Feeding Details (indicate cue type and reason): daughter verbalized location on tray: at 6 oclock is the mash potatos, your corn is at 3 oclock  to help her understand what is on the plate and its location Grooming: Wash/dry hands;Minimal assistance;Standing Grooming Details (indicate cue type and reason): lob posteriorly with bil Ue washing hands.                 Toilet Transfer: Moderate assistance;Regular Toilet;Rolling walker (2 wheels) Toilet Transfer Details (  indicate cue type and reason): pt needs (A) to power up from commode Toileting- Clothing Manipulation and Hygiene: Minimal assistance;Sit to/from stand Toileting - Clothing Manipulation Details (indicate cue type and reason): pt needs (A) to balance for peri care in standing     Functional mobility during ADLs:  Minimal assistance;Rolling walker (2 wheels) (visual cues needed)       Vision Baseline Vision/History: 2 Legally blind Ability to See in Adequate Light: 4 Severely impaired Patient Visual Report: No change from baseline       Perception     Praxis      Pertinent Vitals/Pain Pain Assessment Pain Assessment: No/denies pain     Hand Dominance Right   Extremity/Trunk Assessment Upper Extremity Assessment Upper Extremity Assessment: Overall WFL for tasks assessed   Lower Extremity Assessment Lower Extremity Assessment: Generalized weakness   Cervical / Trunk Assessment Cervical / Trunk Assessment: Kyphotic   Communication Communication Communication: HOH   Cognition Arousal/Alertness: Awake/alert, Lethargic Behavior During Therapy: Flat affect Overall Cognitive Status: Impaired/Different from baseline Area of Impairment: Attention, Safety/judgement, Awareness, Problem solving                   Current Attention Level: Sustained   Following Commands: Follows one step commands inconsistently Safety/Judgement: Decreased awareness of safety, Decreased awareness of deficits Awareness: Emergent Problem Solving: Slow processing, Difficulty sequencing, Requires verbal cues, Requires tactile cues General Comments: pt needs redirection during session. pt answering questions that were answered.     General Comments  VSS on RA    Exercises     Shoulder Instructions      Home Living Family/patient expects to be discharged to:: Private residence Living Arrangements: Children Available Help at Discharge: Family Type of Home: House Home Access: Stairs to enter Secretary/administrator of Steps: 6 Entrance Stairs-Rails: Right Home Layout: Able to live on main level with bedroom/bathroom     Bathroom Shower/Tub: Producer, television/film/video: Standard Bathroom Accessibility: Yes How Accessible: Accessible via wheelchair Home Equipment: Agricultural consultant (2  wheels);Wheelchair - manual;BSC/3in1;Hand held shower head;Shower seat   Additional Comments: lives with son and daugther in law with alot of animals including birds      Prior Functioning/Environment Prior Level of Function : Needs assist             Mobility Comments: 4ww baseline ADLs Comments: reports assist from dtr in law with dressing and bathing in shower        OT Problem List: Impaired balance (sitting and/or standing);Decreased activity tolerance;Decreased cognition;Decreased safety awareness;Decreased knowledge of use of DME or AE;Decreased knowledge of precautions      OT Treatment/Interventions: Self-care/ADL training;DME and/or AE instruction;Therapeutic activities;Cognitive remediation/compensation;Patient/family education;Balance training;Energy conservation    OT Goals(Current goals can be found in the care plan section) Acute Rehab OT Goals Patient Stated Goal: to eat lunch- set up in the chair with lunch with the patient OT Goal Formulation: With patient/family Time For Goal Achievement: 09/16/22 Potential to Achieve Goals: Good  OT Frequency: Min 1X/week    Co-evaluation              AM-PAC OT "6 Clicks" Daily Activity     Outcome Measure Help from another person eating meals?: A Little Help from another person taking care of personal grooming?: A Little Help from another person toileting, which includes using toliet, bedpan, or urinal?: A Lot Help from another person bathing (including washing, rinsing, drying)?: A Lot Help from another person to put on and  taking off regular upper body clothing?: A Little Help from another person to put on and taking off regular lower body clothing?: A Lot 6 Click Score: 15   End of Session Equipment Utilized During Treatment: Gait belt;Rolling walker (2 wheels) Nurse Communication: Mobility status;Precautions  Activity Tolerance: Patient tolerated treatment well Patient left: in chair;with call bell/phone  within reach;with chair alarm set;with family/visitor present  OT Visit Diagnosis: Unsteadiness on feet (R26.81);Muscle weakness (generalized) (M62.81)                Time: 1191-4782 OT Time Calculation (min): 20 min Charges:  OT General Charges $OT Visit: 1 Visit OT Evaluation $OT Eval Moderate Complexity: 1 Mod   Brynn, OTR/L  Acute Rehabilitation Services Office: [phone] .   Mateo Flow 09/02/2022, 1:34 PM

## 2022-09-03 ENCOUNTER — Telehealth: Payer: Self-pay | Admitting: Medical Oncology

## 2022-09-03 ENCOUNTER — Inpatient Hospital Stay: Payer: Medicare Other

## 2022-09-03 ENCOUNTER — Telehealth: Payer: Self-pay | Admitting: Internal Medicine

## 2022-09-03 DIAGNOSIS — S065XAA Traumatic subdural hemorrhage with loss of consciousness status unknown, initial encounter: Secondary | ICD-10-CM | POA: Diagnosis not present

## 2022-09-03 LAB — CBC WITH DIFFERENTIAL/PLATELET
Abs Immature Granulocytes: 0.02 10*3/uL (ref 0.00–0.07)
Basophils Absolute: 0 10*3/uL (ref 0.0–0.1)
Basophils Relative: 0 %
Eosinophils Absolute: 0.1 10*3/uL (ref 0.0–0.5)
Eosinophils Relative: 1 %
HCT: 25.3 % — ABNORMAL LOW (ref 36.0–46.0)
Hemoglobin: 8.6 g/dL — ABNORMAL LOW (ref 12.0–15.0)
Immature Granulocytes: 0 %
Lymphocytes Relative: 17 %
Lymphs Abs: 0.9 10*3/uL (ref 0.7–4.0)
MCH: 33.6 pg (ref 26.0–34.0)
MCHC: 34 g/dL (ref 30.0–36.0)
MCV: 98.8 fL (ref 80.0–100.0)
Monocytes Absolute: 0.4 10*3/uL (ref 0.1–1.0)
Monocytes Relative: 7 %
Neutro Abs: 3.9 10*3/uL (ref 1.7–7.7)
Neutrophils Relative %: 75 %
Platelets: 133 10*3/uL — ABNORMAL LOW (ref 150–400)
RBC: 2.56 MIL/uL — ABNORMAL LOW (ref 3.87–5.11)
RDW: 20.9 % — ABNORMAL HIGH (ref 11.5–15.5)
WBC: 5.3 10*3/uL (ref 4.0–10.5)
nRBC: 0 % (ref 0.0–0.2)

## 2022-09-03 LAB — BPAM RBC
ISSUE DATE / TIME: 202407221147
ISSUE DATE / TIME: 202407230831
Unit Type and Rh: 9500

## 2022-09-03 LAB — TYPE AND SCREEN

## 2022-09-03 MED ORDER — ONDANSETRON HCL 4 MG PO TABS: 4.0000 mg | ORAL_TABLET | Freq: Every day | ORAL | 1 refills | Status: DC | PRN

## 2022-09-03 NOTE — Progress Notes (Signed)
Explained discharge instructions to patient's son Brett Canales who is her Healthcare POA. Reviewed follow up appointment and next medication administration times. Also reviewed education. Patient verbalized having an understanding for instructions given. All belongings are in the patient's possession. IV and telemetry were removed. CCMD was notified. No other needs verbalized. Transported downstairs for discharge.

## 2022-09-03 NOTE — Telephone Encounter (Signed)
Pt gets dizzy and then nauseated. Inpt provider prescribed zofran for this nausea .

## 2022-09-03 NOTE — Discharge Summary (Signed)
Physician Discharge Summary  Megan Hester Rens XBM:[id number] DOB: 1930-08-12 DOA: 08/31/2022  PCP: Geoffry Paradise, MD  Admit date: 08/31/2022 Discharge date: 09/03/2022  Admitted From: Home Disposition: Home with home health  Recommendations for Outpatient Follow-up:  Follow up with PCP in 1-2 weeks Please obtain BMP/CBC in one week Continue to follow with oncology as you are doing  Home Health: PT/OT Equipment/Devices: None  Discharge Condition: Fair CODE STATUS: DNR Diet recommendation: Regular diet, nutritional supplements  Discharge summary: 89 y.o. female with PMH of low grade MDS, anemia, HTN, macular degeneration, PAF, retinal hemorrhage s/p right eye enucleation, GERD who presented after an unwitnessed fall at home.  She resides with son and his wife.  Family had heard a sound concerning for patient falling and upon evaluation, she had fallen and hit her forehead.  This was followed by some dry heaving but no vomiting.  She developed confusion and was brought to the ER for further evaluation.  She has not been on anticoagulation in setting of her A-fib due to bleed risk and chronic anemia related to MDS.   CT head obtained in the ER showed acute left holohemispheric subdural hematoma measuring 30 mm along the posterior aspect of the left frontal lobe with 9 mm rightward midline shift. Case was discussed with neurosurgery and she was recommended for transfer to [facility] but was not considered a surgical candidate.  Traumatic subdural hematoma, MDS with chronic anemia, refractory anemia with baseline hemoglobin about 7.5. Patient did good clinical recovery.  She does not have any evidence of neurological compromise.  Seen by neurosurgery.  Recommended conservative management.  Not on anticoagulation or antiplatelets. Patient was given 2 units of PRBC transfusion with appropriate response. Remains overall stable. Goal of care discussion was held, patient is still enjoying  quality life with family show they want to continue blood transfusions.  She remains DNR.  Family is open to hospice once her quality of life is poor and she becomes more sicker.  Stable for discharge with family.  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)   Anxiety and depression    Discharge Instructions  Discharge Instructions     Diet general   Complete by: As directed    Increase activity slowly   Complete by: As directed       Allergies as of 09/03/2022       Reactions   Shellfish Allergy Hives, Swelling, Other (See Comments)   Shrimp and lobster only   Atropine Other (See Comments)   Causes A-FIB   Codeine Other (See Comments)   Leg pain   Doxycycline Other (See Comments)   Unknown reaction   Erythromycin Other (See Comments)   Unknown reaction   Fenoprofen Calcium Swelling   Iodinated Contrast Media Hives, Other (See Comments)   Isopto Hyoscine [scopolamine]    Causes AFIB   Naproxen Other (See Comments)   Unknown reaction   Other Swelling, Other (See Comments)   Nutrasweet - Bleeding Naphon - swelling   Oxycodone Other (See Comments)   Depression - pt tolerates as needed    Penicillins    Has patient had a PCN  reaction causing immediate rash, facial/tongue/throat swelling, SOB or lightheadedness with hypotension: unknown Has patient had a PCN reaction causing severe rash involving mucus membranes or skin necrosis: unknown Has patient had a PCN reaction that required hospitalization unknown Has patient had a PCN reaction occurring within the last 10 years: childhood If all of the above answers are "NO",  then may proceed with Cephalosporin use. unknown   Sulfa Drugs Cross Reactors Swelling, Other (See Comments)   Ankle swelling    Warfarin Other (See Comments)   Bleeding from eyes   Ceclor [cefaclor] Hives   Levaquin [levofloxacin] Rash, Other (See Comments)   Rash & causes AFIB   Tape Rash   Use paper tape        Medication List     TAKE these medications    acetaminophen 500 MG tablet Commonly known as: TYLENOL Take 500 mg by mouth every 6 (six) hours as needed for moderate pain.   amiodarone 200 MG tablet Commonly known as: PACERONE Take 200 mg by mouth daily with supper.   ARTIFICIAL TEARS OP Place 1 drop into the left eye 3 (three) times daily as needed (for dryness).   cetirizine 10 MG tablet Commonly known as: ZYRTEC Take 10 mg by mouth every evening.   clotrimazole-betamethasone cream Commonly known as: LOTRISONE Apply 1 application  topically 2 (two) times daily as needed (to affected areas).   Glucosamine HCl Powd Take 8.5 g by mouth See admin instructions. Mix 8.5 grams (0.5 scoopful) into juice and drink once a day, as tolerated   IRON PO Take 1 tablet by mouth daily.   Melatonin 10 MG Tabs Take 10 mg by mouth at bedtime.   metoprolol succinate 25 MG 24 hr tablet Commonly known as: TOPROL-XL Take 1 tablet (25 mg total) by mouth daily.   ondansetron 4 MG tablet Commonly known as: Zofran Take 1 tablet (4 mg total) by mouth daily as needed for nausea or vomiting.   PRESERVISION/LUTEIN PO Take 1 capsule by mouth 2 (two) times daily.   QUEtiapine 25 MG tablet Commonly known as: SEROQUEL Take 25 mg by mouth at bedtime.   senna-docusate 8.6-50 MG tablet Commonly known as: Senokot-S Take 1 tablet by mouth daily.   sertraline 100 MG tablet Commonly known as: ZOLOFT Take 100 mg by mouth at bedtime.   SYSTANE OP Place 1 drop into the right eye daily as needed (for dryness).   Vitamin D 1000 units capsule Take 1,000 Units by mouth daily with  supper.        Allergies  Allergen Reactions   Shellfish Allergy Hives, Swelling and Other (See Comments)    Shrimp and lobster only   Atropine Other (See Comments)    Causes A-FIB   Codeine Other (See Comments)    Leg pain   Doxycycline Other (See Comments)    Unknown reaction   Erythromycin Other (See Comments)    Unknown reaction   Fenoprofen Calcium Swelling   Iodinated Contrast Media Hives and Other (See Comments)   Isopto Hyoscine [Scopolamine]     Causes AFIB   Naproxen Other (See Comments)    Unknown reaction   Other Swelling and Other (See Comments)    Nutrasweet - Bleeding Naphon - swelling   Oxycodone Other (See Comments)    Depression - pt tolerates as needed    Penicillins     Has patient had a PCN reaction causing immediate rash, facial/tongue/throat swelling, SOB or lightheadedness with hypotension: unknown Has patient had  a PCN reaction causing severe rash involving mucus membranes or skin necrosis: unknown Has patient had a PCN reaction that required hospitalization unknown Has patient had a PCN reaction occurring within the last 10 years: childhood If all of the above answers are "NO", then may proceed with Cephalosporin use. unknown   Sulfa Drugs Cross Reactors Swelling and Other (See Comments)    Ankle swelling    Warfarin Other (See Comments)    Bleeding from eyes   Ceclor [Cefaclor] Hives   Levaquin [Levofloxacin] Rash and Other (See Comments)    Rash & causes AFIB   Tape Rash    Use paper tape    Consultations: Neurosurgery Palliative care   Procedures/Studies: CT Cervical Spine Wo Contrast  Result Date: 08/31/2022 CLINICAL DATA:  Trauma EXAM: CT CERVICAL SPINE WITHOUT CONTRAST TECHNIQUE: Multidetector CT imaging of the cervical spine was performed without intravenous contrast. Multiplanar CT image reconstructions were also generated. RADIATION DOSE REDUCTION: This exam was performed according to the departmental dose-optimization program  which includes automated exposure control, adjustment of the mA and/or kV according to patient size and/or use of iterative reconstruction technique. COMPARISON:  Cervical spine CT 09/02/2021. FINDINGS: Alignment: There is trace anterolisthesis at C3-C4 and C4-C5 which is unchanged and favored as degenerative. Alignment is otherwise anatomic. There is straightening of normal cervical lordosis. Skull base and vertebrae: No acute fracture. No primary bone lesion or focal pathologic process. Soft tissues and spinal canal: No prevertebral fluid or swelling. No visible canal hematoma. Disc levels: There is disc space narrowing and endplate osteophyte formation most significant at C5-C6. There is no significant central canal stenosis at any level. Bilateral facet arthropathy appears stable causing mild neural foraminal stenosis at multiple levels. Upper chest: Moderate right pleural effusion. Other: The thyroid gland is diffusely heterogeneous containing multiple nodules measuring up to 7 mm. IMPRESSION: 1. No acute fracture or traumatic subluxation of the cervical spine. 2. Multilevel degenerative changes. 3. Moderate right pleural effusion. 4. Heterogeneous thyroid gland containing multiple nodules measuring up to 7 mm. Recommend clinical correlation and follow-up. Electronically Signed   By: Darliss Cheney M.D.   On: 08/31/2022 21:46   CT Head Wo Contrast  Result Date: 08/31/2022 CLINICAL DATA:  Head trauma, minor (Age >= 65y). EXAM: CT HEAD WITHOUT CONTRAST TECHNIQUE: Contiguous axial images were obtained from the base of the skull through the vertex without intravenous contrast. RADIATION DOSE REDUCTION: This exam was performed according to the departmental dose-optimization program which includes automated exposure control, adjustment of the mA and/or kV according to patient size and/or use of iterative reconstruction technique. COMPARISON:  Head CT 09/02/2021. FINDINGS: Brain: Acute left holohemispheric subdural  hematoma, measuring up to 30 mm along the posterior aspect of the left frontal lobe (coronal image 42 series 5) with 9 mm of rightward midline shift (coronal image 36 series 5). Mild mass effect on the left lateral ventricle. No evidence of entrapment. Cortical gray-white differentiation is preserved. Unchanged background of mild chronic small-vessel disease. Basilar cisterns are patent. Vascular: No hyperdense vessel or unexpected calcification. Skull: No calvarial fracture or suspicious bone lesion. Skull base is unremarkable. Sinuses/Orbits: Moderate mucosal disease in the bilateral sphenoid sinuses. Orbits are unremarkable. Other: None. IMPRESSION: Acute left holohemispheric subdural hematoma, measuring up to 30 mm along the posterior aspect of the left frontal lobe, with 9 mm of rightward midline shift. Critical Value/emergent results were called by telephone at the time of interpretation on 08/31/2022 at 9:43 pm to provider TRAVIS YOUNG , who  verbally acknowledged these results. Electronically Signed   By: Orvan Falconer M.D.   On: 08/31/2022 21:46   DG Chest Portable 1 View  Result Date: 08/31/2022 CLINICAL DATA:  Syncope EXAM: PORTABLE CHEST 1 VIEW COMPARISON:  Chest x-ray 06/30/2020 FINDINGS: The heart is enlarged, unchanged. There is a small right pleural effusion. Left-sided pacemaker is again seen. There is no lung consolidation or pneumothorax. Surgical clips are seen surrounding a calcific density overlying the right chest, unchanged. No acute fractures are seen. The bones are diffusely osteopenic. IMPRESSION: 1. Small right pleural effusion. 2. Cardiomegaly. Electronically Signed   By: Darliss Cheney M.D.   On: 08/31/2022 19:51   CUP PACEART REMOTE DEVICE CHECK  Result Date: 08/26/2022 Scheduled remote reviewed. Normal device function.  Total AF burden <0.1%, longest AF episode lasted 9 minutes, EGMs consistent with atrial flutter.  Known history of AF, on amiodarone. Next remote 91 days. - CS,  CVRS  (Echo, Carotid, EGD, Colonoscopy, ERCP)    Subjective: Patient seen and examined.  No overnight events.  Daughter at the bedside.  Patient denies any complaints.  She was very happy to know she is going home.   Discharge Exam: Vitals:   09/03/22 0302 09/03/22 0835  BP: (!) 158/68 (!) 146/70  Pulse: (!) 59 (!) 57  Resp:  14  Temp: 98.6 F (37 C) 98.3 F (36.8 C)  SpO2: 100% 91%   Vitals:   09/02/22 2120 09/02/22 2336 09/03/22 0302 09/03/22 0835  BP: (!) 159/73 (!) 146/68 (!) 158/68 (!) 146/70  Pulse: 62 63 (!) 59 (!) 57  Resp: 18   14  Temp: 98.4 F (36.9 C) 97.9 F (36.6 C) 98.6 F (37 C) 98.3 F (36.8 C)  TempSrc: Oral Oral Oral Oral  SpO2: 97% 99% 100% 91%    General: Pt is alert, awake, not in acute distress Enucleated right eye. Alert and awake.  Mostly oriented to time place and person.  Slow to respond.  No focal logical deficits. Cardiovascular: RRR, S1/S2 +, no rubs, no gallops, pacemaker left precordium. Respiratory: CTA bilaterally, no wheezing, no rhonchi Abdominal: Soft, NT, ND, bowel sounds + Extremities: no edema, no cyanosis    The results of significant diagnostics from this hospitalization (including imaging, microbiology, ancillary and laboratory) are listed below for reference.     Microbiology: No results found for this or any previous visit (from the past 240 hour(s)).   Labs: BNP (last 3 results) Recent Labs    12/04/21 1414  BNP 325.3*   Basic Metabolic Panel: Recent Labs  Lab 08/31/22 1932 09/01/22 0437 09/02/22 0014  NA 139 138 138  K 3.1* 3.2* 4.0  CL 106 106 108  CO2 26 26 25   GLUCOSE 116* 123* 103*  BUN 19 18 18   CREATININE 0.73 0.70 0.79  CALCIUM 8.3* 8.3* 8.4*  MG  --   --  1.9  PHOS  --   --  2.9   Liver Function Tests: Recent Labs  Lab 08/31/22 1932 09/02/22 0014  AST 15 11*  ALT 10 10  ALKPHOS 143* 104  BILITOT 1.0 1.4*  PROT 5.7* 5.3*  ALBUMIN 3.1* 2.7*   No results for input(s): "LIPASE",  "AMYLASE" in the last 168 hours. No results for input(s): "AMMONIA" in the last 168 hours. CBC: Recent Labs  Lab 08/31/22 1932 09/01/22 0437 09/01/22 1919 09/02/22 0014 09/02/22 1544 09/03/22 0049  WBC 4.4 5.4  --  4.5  --  5.3  NEUTROABS  --   --   --  2.7  --  3.9  HGB 7.1* 6.8* 7.5* 6.9* 8.8* 8.6*  HCT 22.4* 21.3* 23.3* 21.6* 26.5* 25.3*  MCV 103.2* 103.9*  --  100.0  --  98.8  PLT 160 162  --  138*  --  133*   Cardiac Enzymes: No results for input(s): "CKTOTAL", "CKMB", "CKMBINDEX", "TROPONINI" in the last 168 hours. BNP: Invalid input(s): "POCBNP" CBG: No results for input(s): "GLUCAP" in the last 168 hours. D-Dimer No results for input(s): "DDIMER" in the last 72 hours. Hgb A1c No results for input(s): "HGBA1C" in the last 72 hours. Lipid Profile No results for input(s): "CHOL", "HDL", "LDLCALC", "TRIG", "CHOLHDL", "LDLDIRECT" in the last 72 hours. Thyroid function studies No results for input(s): "TSH", "T4TOTAL", "T3FREE", "THYROIDAB" in the last 72 hours.  Invalid input(s): "FREET3" Anemia work up No results for input(s): "VITAMINB12", "FOLATE", "FERRITIN", "TIBC", "IRON", "RETICCTPCT" in the last 72 hours. Urinalysis    Component Value Date/Time   COLORURINE AMBER (A) 09/02/2022 0727   APPEARANCEUR HAZY (A) 09/02/2022 0727   LABSPEC 1.021 09/02/2022 0727   PHURINE 5.0 09/02/2022 0727   GLUCOSEU NEGATIVE 09/02/2022 0727   HGBUR NEGATIVE 09/02/2022 0727   BILIRUBINUR NEGATIVE 09/02/2022 0727   KETONESUR NEGATIVE 09/02/2022 0727   PROTEINUR 30 (A) 09/02/2022 0727   UROBILINOGEN 0.2 08/23/2014 0951   NITRITE NEGATIVE 09/02/2022 0727   LEUKOCYTESUR SMALL (A) 09/02/2022 0727   Sepsis Labs Recent Labs  Lab 08/31/22 1932 09/01/22 0437 09/02/22 0014 09/03/22 0049  WBC 4.4 5.4 4.5 5.3   Microbiology No results found for this or any previous visit (from the past 240 hour(s)).   Time coordinating discharge: 32 minutes  SIGNED:   Dorcas Carrow,  MD  [facility] 09/03/2022, 9:43 AM

## 2022-09-03 NOTE — TOC Transition Note (Signed)
Transition of Care [facility]) - CM/SW Discharge Note   Patient Details  Name: Megan Hester MRN: [id number] Date of Birth: March 28, 1930  Transition of Care [facility]) CM/SW Contact:  Kermit Balo, RN Phone Number: 09/03/2022, 9:55 AM   Clinical Narrative:    Pt is discharging home with home health services through [facility]. Pt has used [facility] in the past and family would like to use them again. Information on the AVS. Daughter states they have all needed DME at home. Family to provide transport home.   Final next level of care: Home w Home Health Services Barriers to Discharge: No Barriers Identified   Patient Goals and CMS Choice CMS Medicare.gov Compare Post Acute Care list provided to:: Patient Represenative (must comment) Choice offered to / list presented to : Adult Children  Discharge Placement                         Discharge Plan and Services Additional resources added to the After Visit Summary for                            [facility] Arranged: PT, OT [facility] Agency: [facility] Date [facility] Agency Contacted: 09/03/22   Representative spoke with at [facility] Agency: Tresa Endo  Social Determinants of Health (SDOH) Interventions SDOH Screenings   Food Insecurity: No Food Insecurity (01/15/2022)  Housing: Low Risk  (01/15/2022)  Transportation Needs: No Transportation Needs (01/15/2022)  Utilities: Not At Risk (01/15/2022)  Tobacco Use: Low Risk  (09/01/2022)     Readmission Risk Interventions    04/25/2022    9:34 AM 01/16/2022   11:37 AM  Readmission Risk Prevention Plan  Transportation Screening Complete Complete  PCP or Specialist Appt within 3-5 Days Complete Complete  HRI or Home Care Consult Complete Complete  Social Work Consult for Recovery Care Planning/Counseling Complete Complete  Palliative Care Screening Not Applicable Not Applicable  Medication Review [employer]) Complete Complete

## 2022-09-06 ENCOUNTER — Other Ambulatory Visit: Payer: Self-pay

## 2022-09-06 ENCOUNTER — Emergency Department (HOSPITAL_COMMUNITY): Payer: Medicare Other

## 2022-09-06 ENCOUNTER — Emergency Department (HOSPITAL_COMMUNITY)
Admission: EM | Admit: 2022-09-06 | Discharge: 2022-09-06 | Disposition: A | Discharge status: Home / Self Care | Payer: Medicare Other | Attending: Emergency Medicine | Admitting: Emergency Medicine

## 2022-09-06 DIAGNOSIS — X58XXXA Exposure to other specified factors, initial encounter: Secondary | ICD-10-CM | POA: Diagnosis not present

## 2022-09-06 DIAGNOSIS — R519 Headache, unspecified: Secondary | ICD-10-CM

## 2022-09-06 DIAGNOSIS — S065XAA Traumatic subdural hemorrhage with loss of consciousness status unknown, initial encounter: Secondary | ICD-10-CM

## 2022-09-06 DIAGNOSIS — Z79899 Other long term (current) drug therapy: Secondary | ICD-10-CM | POA: Insufficient documentation

## 2022-09-06 DIAGNOSIS — I10 Essential (primary) hypertension: Secondary | ICD-10-CM | POA: Diagnosis not present

## 2022-09-06 DIAGNOSIS — S065X0A Traumatic subdural hemorrhage without loss of consciousness, initial encounter: Secondary | ICD-10-CM | POA: Diagnosis not present

## 2022-09-06 LAB — BASIC METABOLIC PANEL
Anion gap: 8 (ref 5–15)
BUN: 8 mg/dL (ref 8–23)
CO2: 23 mmol/L (ref 22–32)
Calcium: 8.3 mg/dL — ABNORMAL LOW (ref 8.9–10.3)
Chloride: 106 mmol/L (ref 98–111)
Creatinine, Ser: 0.79 mg/dL (ref 0.44–1.00)
GFR, Estimated: >60 mL/min (ref >60)
Glucose, Bld: 106 mg/dL — ABNORMAL HIGH (ref 70–99)
Potassium: 3.5 mmol/L (ref 3.5–5.1)
Sodium: 137 mmol/L (ref 135–145)

## 2022-09-06 LAB — CBC WITH DIFFERENTIAL/PLATELET
Abs Immature Granulocytes: 0.02 10*3/uL (ref 0.00–0.07)
Basophils Absolute: 0 10*3/uL (ref 0.0–0.1)
Basophils Relative: 1 %
Eosinophils Absolute: 0.2 10*3/uL (ref 0.0–0.5)
Eosinophils Relative: 5 %
HCT: 26.3 % — ABNORMAL LOW (ref 36.0–46.0)
Hemoglobin: 8.5 g/dL — ABNORMAL LOW (ref 12.0–15.0)
Immature Granulocytes: 1 %
Lymphocytes Relative: 21 %
Lymphs Abs: 0.8 10*3/uL (ref 0.7–4.0)
MCH: 33.7 pg (ref 26.0–34.0)
MCHC: 32.3 g/dL (ref 30.0–36.0)
MCV: 104.4 fL — ABNORMAL HIGH (ref 80.0–100.0)
Monocytes Absolute: 0.3 10*3/uL (ref 0.1–1.0)
Monocytes Relative: 9 %
Neutro Abs: 2.4 10*3/uL (ref 1.7–7.7)
Neutrophils Relative %: 63 %
Platelets: 142 10*3/uL — ABNORMAL LOW (ref 150–400)
RBC: 2.52 MIL/uL — ABNORMAL LOW (ref 3.87–5.11)
RDW: 19.9 % — ABNORMAL HIGH (ref 11.5–15.5)
WBC: 3.8 10*3/uL — ABNORMAL LOW (ref 4.0–10.5)
nRBC: 0 % (ref 0.0–0.2)

## 2022-09-06 MED ORDER — ACETAMINOPHEN 325 MG PO TABS
650.0000 mg | ORAL_TABLET | Freq: Once | ORAL | Status: AC
  Administered 2022-09-06: 650 mg via ORAL
  Filled 2022-09-06: qty 2

## 2022-09-06 NOTE — Discharge Instructions (Addendum)
Get help right away if: You are taking blood thinners or have a bleeding disorder and fall or experience minor trauma to the head. If you take blood thinners, even a small injury can cause a subdural hematoma. You develop any of these symptoms after a head injury: Clear fluid draining from your nose or ears. Nausea or vomiting. Changes in speech or trouble understanding what people say. Seizures. Sleepiness or a decrease in alertness. Double vision. Numbness or inability to move in any part of your body. Difficulty walking or poor coordination. Difficulty thinking. Confusion or forgetfulness. Personality changes. Irrational or aggressive behavior. These symptoms may be an emergency. Get help right away. Call 911. Do not wait to see if the symptoms will go away. Do not drive yourself to the hospital.

## 2022-09-06 NOTE — ED Triage Notes (Signed)
Pt to ED via [facility] from home c/o HA that started aprox 1 hour. Sudden onset. Pt recently hospitalized for subdural hemorrhage . Pt not on blood thinners. No medications given by EMS. A&O X 3. No neuo deficits noted by EMS.  #20 LFA  Last VS: 177/85, 96%RA, HR 79

## 2022-09-06 NOTE — ED Notes (Signed)
Pt d/c home per MD order, discharge summary reviewed, pt verbalizes understanding. Off unit via WC- d/c home with visitor.

## 2022-09-06 NOTE — ED Provider Notes (Signed)
[place] EMERGENCY DEPARTMENT AT [facility] Provider Note   CSN: [id number] Arrival date & time: 09-26-2022  1327     History  Chief Complaint  Patient presents with   Headache    Megan Hester is a 89 y.o. 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  She arrives via EMS.  History is gathered at bedside from the patient and EMS.  She is 3 days out from discharge after being admitted for traumatic subdural hematoma on 08/31/2022.  He was also notably very anemic.  She received 2 units of PRBCs.  Patient reports a new headache behind her right eye.  She says she took Tylenol without significant improvement.   Headache      Home Medications Prior to Admission medications   Medication Sig Start Date End Date Taking? Authorizing Provider  acetaminophen (TYLENOL) 500 MG tablet Take 500 mg by mouth every 6 (six) hours as needed for moderate pain.    [provider]  amiodarone (PACERONE) 200 MG tablet Take 200 mg by mouth daily with supper.    [provider]  cetirizine (ZYRTEC) 10 MG tablet Take 10 mg by mouth every evening.    [provider]  Cholecalciferol (VITAMIN D) 1000 UNITS capsule Take 1,000 Units by mouth daily with supper.    [provider]  clotrimazole-betamethasone (LOTRISONE) cream Apply 1 application  topically 2 (two) times daily as needed (to affected areas). 12/31/20   [provider]  Ferrous Sulfate (IRON PO) Take 1 tablet by mouth daily.    [provider]  Glucosamine HCl POWD Take 8.5 g by mouth See admin instructions. Mix 8.5 grams (0.5 scoopful) into juice and drink once a day, as tolerated    [provider]  Hypromellose (ARTIFICIAL TEARS OP) Place 1 drop into the left eye 3 (three) times  daily as needed (for dryness).    [provider]  Melatonin 10 MG TABS Take 10 mg by mouth at bedtime.    [provider]  metoprolol succinate (TOPROL-XL) 25 MG 24 hr tablet Take 1 tablet (25 mg total) by mouth daily. 06/02/22 06/03/23  Little Ishikawa, MD  Multiple Vitamins-Minerals (PRESERVISION/LUTEIN PO) Take 1 capsule by mouth 2 (two) times daily.     [provider]  ondansetron (ZOFRAN) 4 MG tablet Take 1 tablet (4 mg total) by mouth daily as needed for nausea or vomiting. 09/03/22 09/03/23  Dorcas Carrow, MD  Polyethyl Glycol-Propyl Glycol (SYSTANE OP) Place 1 drop into the right eye daily as needed (for dryness).    [provider]  QUEtiapine (SEROQUEL) 25 MG tablet Take 25 mg by mouth at bedtime. 12/11/19   [provider]  senna-docusate (SENOKOT-S) 8.6-50 MG tablet Take 1 tablet by mouth daily.    [provider]  sertraline (ZOLOFT) 100 MG tablet Take 100 mg by mouth at bedtime.    [provider]      Allergies    Shellfish allergy, Atropine, Codeine, Doxycycline, Erythromycin, Fenoprofen calcium, Iodinated contrast media, Isopto hyoscine [scopolamine], Naproxen, Other, Oxycodone, Penicillins, Sulfa drugs cross reactors, Warfarin, Ceclor [cefaclor], Levaquin [levofloxacin], and Tape    Review of Systems   Review of Systems  Neurological:  Positive for headaches.    Physical Exam Updated Vital Signs BP (!) 111/100 (BP Location: Right Arm)   Pulse 61  Temp 97.7 F (36.5 C) (Axillary)   Resp 17   Ht 5\' 4"  (1.626 m)   SpO2 100%   BMI 21.39 kg/m  Physical Exam  ED Results / Procedures / Treatments   Labs (all labs ordered are listed, but only abnormal results are displayed) Labs Reviewed - No data to display  EKG None  Radiology No results found.  Procedures Procedures    Medications Ordered in ED Medications - No data to display  ED Course/ Medical Decision Making/ A&P Clinical Course  as of 10/05/2022 1550  Sat Oct 05, 2022  1428 CT Head Wo Contrast Generalized interpreted CT head which shows no worsening in the patient's subdural hematoma, no signs of intracranial hemorrhage. [AH]  1513 CBC with Differential(!) [AH]  1513 Hemoglobin(!): 8.5 Hemoglobin appears stable [AH]  1513 Basic metabolic panel(!) BMP sick without significant abnormality. [AH]    Clinical Course User Index [AH] Arthor Captain, PA-C                             Medical Decision Making Megan Hester presents with headache Given the large differential diagnosis for Megan Hester, the decision making in this case is of high complexity.  After evaluating all of the data points in this case, the presentation of Megan Hester is NOT consistent with skull fracture, meningitis/encephalitis, SAH/sentinel bleed, Intracranial Hemorrhage (ICH) (subdural/epidural), acute obstructive hydrocephalus, space occupying lesions, CVA, CO Poisoning, Basilar/vertebral artery dissection, preeclampsia, cerebral venous thrombosis, hypertensive emergency, temporal Arteritis, Idiopathic Intracranial Hypertension (pseudotumor cerebri).  Strict return and follow-up precautions have been given by me personally or by detailed written instructions verbalized by nursing staff using the teach back method to patient/family/caregiver.  Data Reviewed/Counseling: I have reviewed the patient's vital signs, nursing notes, and other relevant tests/information. I had a detailed discussion regarding the historical points, exam findings, and any diagnostic results supporting the discharge diagnosis. I also discussed the need for outpatient follow-up and the need to return to the ED if symptoms worsen or if there are any questions or concerns that arise at hom    Amount and/or Complexity of Data Reviewed Labs: ordered. Decision-making details documented in ED Course.    Details: I reviewed the patient's labs which show stable  anemia Radiology: ordered and independent interpretation performed. Decision-making details documented in ED Course.    Details: I personally visualized and interpreted the images using our PACS system. Acute findings include:  No significant change, no worsening of subdural hematoma, no intracerebral hemorrhage   Risk OTC drugs.           Final Clinical Impression(s) / ED Diagnoses Final diagnoses:  Bad headache  Subdural hematoma [facility])    Rx / DC Orders ED Discharge Orders     None         Arthor Captain, PA-C 2022-10-05 2135    Derwood Kaplan, MD 09/07/22 2027

## 2022-09-07 ENCOUNTER — Emergency Department (HOSPITAL_COMMUNITY)
Admission: EM | Admit: 2022-09-07 | Discharge: 2022-09-07 | Disposition: A | Discharge status: Skilled Nursing Facility | Attending: Emergency Medicine | Admitting: Emergency Medicine

## 2022-09-07 DIAGNOSIS — W19XXXD Unspecified fall, subsequent encounter: Secondary | ICD-10-CM | POA: Insufficient documentation

## 2022-09-07 DIAGNOSIS — S065X0D Traumatic subdural hemorrhage without loss of consciousness, subsequent encounter: Secondary | ICD-10-CM | POA: Diagnosis not present

## 2022-09-07 DIAGNOSIS — Z515 Encounter for palliative care: Secondary | ICD-10-CM | POA: Diagnosis not present

## 2022-09-07 DIAGNOSIS — R464 Slowness and poor responsiveness: Secondary | ICD-10-CM | POA: Diagnosis present

## 2022-09-07 DIAGNOSIS — R402431 Glasgow coma scale score 3-8, in the field [EMT or ambulance]: Secondary | ICD-10-CM

## 2022-09-07 MED ORDER — SODIUM CHLORIDE 0.9% FLUSH: 3.0000 mL | INTRAVENOUS | Status: DC | PRN

## 2022-09-07 MED ORDER — ACETAMINOPHEN 650 MG RE SUPP: 650.0000 mg | Freq: Four times a day (QID) | RECTAL | Status: DC | PRN

## 2022-09-07 MED ORDER — LORAZEPAM 1 MG PO TABS: 1.0000 mg | ORAL_TABLET | ORAL | Status: DC | PRN

## 2022-09-07 MED ORDER — SODIUM CHLORIDE 0.9 % IV SOLN: 250.0000 mL | INTRAVENOUS | Status: DC | PRN

## 2022-09-07 MED ORDER — LORAZEPAM 2 MG/ML PO CONC: 1.0000 mg | ORAL | Status: DC | PRN

## 2022-09-07 MED ORDER — LORAZEPAM 2 MG/ML IJ SOLN: 1.0000 mg | INTRAMUSCULAR | Status: DC | PRN

## 2022-09-07 MED ORDER — FENTANYL CITRATE PF 50 MCG/ML IJ SOSY: 25.0000 ug | PREFILLED_SYRINGE | INTRAMUSCULAR | Status: DC | PRN

## 2022-09-07 MED ORDER — ACETAMINOPHEN 325 MG PO TABS: 650.0000 mg | ORAL_TABLET | Freq: Four times a day (QID) | ORAL | Status: DC | PRN

## 2022-09-07 MED ORDER — SODIUM CHLORIDE 0.9% FLUSH
3.0000 mL | Freq: Two times a day (BID) | INTRAVENOUS | Status: DC
  Administered 2022-09-07: 3 mL via INTRAVENOUS

## 2022-09-07 NOTE — Care Management (Signed)
[facility] called for transport to [facility] place

## 2022-09-07 NOTE — ED Notes (Signed)
[facility] called for transport.  

## 2022-09-07 NOTE — Progress Notes (Signed)
Civil engineer, contracting [facility]) Hospital Liaison Note  Received request from Transitions of Care Manager Judeth Cornfield for family interest in [facility].   Patient has been approved for [facility]  Please do not hesitate to call with any hospice related questions.    Thank you for the opportunity to participate in this patient's care.  Please send signed DNR form with patient and RN call report to [phone].    Eugenie Birks, MSW [facility] Liaison [phone]

## 2022-09-07 NOTE — ED Provider Notes (Addendum)
EMERGENCY DEPARTMENT AT [facility] Provider Note   CSN: [id number] Arrival date & time: 09/07/22  1114     History  Chief Complaint  Patient presents with   Altered Mental Status    Alaynah Melley is a 89 y.o. female.  HPI    89 year old female comes in with chief complaint of altered mental status.  Level 5 caveat for altered mental status.  According to EMS, patient was seen in the emergency room yesterday for headaches.  She recently had a subdural hematoma.  Repeat CT scan was reassuring, her headaches were improving and she was discharged.  She was last seen normal at 9 PM.  This morning, family found patient unarousable.  Per EMS, patient has a fever.  Patient has no verbal response, no eye response and is noted to have extension of the left foot and stiffness.  She has no gag reflex.  I immediately called patient's son and spoke with Mr. Aria Mattis. According to the patient's son, patient has history of A-fib, MDS and has had poor quality of life the last few months.  Patient has not complained of any illnesses recently.  She only complained of the headache yesterday.  Patient has not had any fevers, reduced p.o. intake.  She had a fall and the subdural hematoma was discovered at that time.  Patient did not have significant neurologic deterioration at that time.  They declined surgical intervention at that time.  Patient was approached by palliative medicine team and [facility] few months back.  Patient was getting transfusions regularly at that time and had a high quality of life.  However in the last few months her quality of life had declined, transfusion frequency was increasing and she had made clear that she would not want to be on a ventilator or life support.  Mr. Nauman indicates that they would want to proceed with comfort care or aggressive measures at this time based on mother's wishes.  Home Medications Prior to Admission  medications   Medication Sig Start Date End Date Taking? Authorizing Provider  acetaminophen (TYLENOL) 500 MG tablet Take 500 mg by mouth every 6 (six) hours as needed for moderate pain.    [provider]  amiodarone (PACERONE) 200 MG tablet Take 200 mg by mouth daily with supper.    [provider]  cetirizine (ZYRTEC) 10 MG tablet Take 10 mg by mouth every evening.    [provider]  Cholecalciferol (VITAMIN D) 1000 UNITS capsule Take 1,000 Units by mouth daily with supper.    [provider]  clotrimazole-betamethasone (LOTRISONE) cream Apply 1 application  topically 2 (two) times daily as needed (to affected areas). 12/31/20   [provider]  Ferrous Sulfate (IRON PO) Take 1 tablet by mouth daily.    [provider]  Glucosamine HCl POWD Take 8.5 g by mouth See admin instructions. Mix 8.5 grams (0.5 scoopful) into juice and drink once a day, as tolerated    [provider]  Hypromellose (ARTIFICIAL TEARS OP) Place 1 drop into the left eye 3 (three) times daily as needed (for dryness).    [provider]  Melatonin 10 MG TABS Take 10 mg by mouth at bedtime.    [provider]  metoprolol succinate (TOPROL-XL) 25 MG 24 hr tablet Take 1 tablet (25 mg total) by mouth daily. 06/02/22 06/03/23  Little Ishikawa, MD  Multiple Vitamins-Minerals (PRESERVISION/LUTEIN PO) Take 1 capsule by mouth 2 (two) times daily.  [provider]  ondansetron (ZOFRAN) 4 MG tablet Take 1 tablet (4 mg total) by mouth daily as needed for nausea or vomiting. 09/03/22 09/03/23  Dorcas Carrow, MD  Polyethyl Glycol-Propyl Glycol (SYSTANE OP) Place 1 drop into the right eye daily as needed (for dryness).    [provider]  QUEtiapine (SEROQUEL) 25 MG tablet Take 25 mg by mouth at bedtime. 12/11/19   [provider]  senna-docusate (SENOKOT-S) 8.6-50 MG tablet Take 1 tablet by mouth daily.    [provider]  sertraline (ZOLOFT) 100 MG tablet Take 100 mg by mouth at bedtime.    [provider]      Allergies    Shellfish allergy, Atropine, Codeine, Doxycycline, Erythromycin, Fenoprofen calcium, Iodinated contrast media, Isopto hyoscine [scopolamine], Naproxen, Other, Oxycodone, Penicillins, Sulfa drugs cross reactors, Warfarin, Ceclor [cefaclor], Levaquin [levofloxacin], and Tape    Review of Systems   Review of Systems  Physical Exam Updated Vital Signs BP (!) 147/81   Pulse 90   Temp (!) 102.8 F (39.3 C) (Rectal)   Resp (!) 23   SpO2 100%  Physical Exam Vitals and nursing note reviewed.  Constitutional:      Appearance: She is well-developed.     Comments: Unresponsive  HENT:     Head: Atraumatic.  Eyes:     Comments: Left pupil is 4 mm  Cardiovascular:     Rate and Rhythm: Normal rate.  Pulmonary:     Effort: Pulmonary effort is normal.  Abdominal:     Palpations: Abdomen is soft.  Musculoskeletal:     Comments: Pt has L lower extremity postural extension, patient also has stiff upper and lower extremities.  She has upgoing Babinski bilaterally.  Left pupil is 4 mm, responsive to light and the right eyes enucleated.  No gaze abnormality.  Patient has diapers on, but she has wet herself.     ED Results / Procedures / Treatments   Labs (all labs ordered are listed, but only abnormal results are displayed) Labs Reviewed - No data to display  EKG None  Radiology CT Head Wo Contrast  Result Date: 09-20-22 CLINICAL DATA:  Acute onset severe headache.  Subdural hematoma. EXAM: CT HEAD WITHOUT CONTRAST TECHNIQUE: Contiguous axial images were obtained from the base of the skull through the vertex without intravenous contrast. RADIATION DOSE REDUCTION: This exam was performed according to the departmental dose-optimization program which includes automated exposure control, adjustment of the mA and/or kV according to patient size and/or use of iterative  reconstruction technique. COMPARISON:  08/31/2022 FINDINGS: Brain: Large left-sided subdural hematoma is again seen throughout the parietal and temporal regions, which measures up to 26 mm in thickness, without significant change compared to recent exam. Left-to-right midline shift measures 6-7 mm, also without significant change since prior study. Mild diffuse cerebral atrophy and chronic small vessel disease again noted. Vascular:  No hyperdense vessel or other acute findings. Skull: No evidence of fracture or other significant bone abnormality. Sinuses/Orbits:  No acute findings. Other: None. IMPRESSION: Large left-sided subdural hematoma with left-to-right midline shift, without significant change since prior exam. Mild cerebral atrophy and chronic small vessel disease. Electronically Signed   By: Danae Orleans M.D.   On: 20-Sep-2022 14:52    Procedures .Critical Care  Performed by: Derwood Kaplan, MD Authorized by: Derwood Kaplan, MD   Critical care provider statement:    Critical care time (minutes):  32   Critical care was necessary to treat or prevent imminent or  life-threatening deterioration of the following conditions:  CNS failure or compromise   Critical care was time spent personally by me on the following activities:  Discussions with consultants, examination of patient, pulse oximetry, re-evaluation of patient's condition, review of old charts and obtaining history from patient or surrogate     Medications Ordered in ED Medications  sodium chloride flush (NS) 0.9 % injection 3 mL (3 mLs Intravenous Given 09/07/22 1220)  sodium chloride flush (NS) 0.9 % injection 3 mL (has no administration in time range)  0.9 %  sodium chloride infusion (has no administration in time range)  acetaminophen (TYLENOL) tablet 650 mg (has no administration in time range)    Or  acetaminophen (TYLENOL) suppository 650 mg (has no administration in time range)  fentaNYL (SUBLIMAZE) injection 25 mcg (has  no administration in time range)  LORazepam (ATIVAN) tablet 1 mg (has no administration in time range)    Or  LORazepam (ATIVAN) 2 MG/ML concentrated solution 1 mg (has no administration in time range)    Or  LORazepam (ATIVAN) injection 1 mg (has no administration in time range)    ED Course/ Medical Decision Making/ A&P Clinical Course as of 09/07/22 1325  Sun Sep 07, 2022  1323 No change in patient's status. Palliative has seen the patient and have agreed with hospice enrollment. TOC has already started on working on a bed at [facility]. Family updated. [AN]    Clinical Course User Index [AN] Derwood Kaplan, MD                             Medical Decision Making Risk OTC drugs. Prescription drug management.   89 year old patient comes in with chief complaint of unresponsiveness.  Her GCS is 4-6 -with 1 for eye movement and verbal response.  She does not have gag reflex.  Patient has history of A-fib, recent subdural hematoma, myelodysplastic syndrome, reported history of sick sinus syndrome and sepsis.  Patient appears warm to touch.  I reviewed the CAT scans from yesterday -patient's CT head was reassuring.  I reviewed the note again from yesterday, patient's headache appears to have improved.  Family confirms the same.  Differential diagnosis at this time for her includes severe sepsis, status epilepticus, meningitis, encephalitis, central fevers because of brain bleed or stroke.  Discussed with the family that at this time patient is critically ill and offers to the diversion pathways.  1 pathway would be to intubate and do full diagnostic workup, aggressive measures and ICU admission.  Other option would be to focus on comfort care and not to do any diagnostic workup.  Tailoring care somewhere in between that would be difficult given that she is not protecting her airway and not responding.  Family has confirmed to proceed with comfort care measures only.  We will  consult palliative service.  I have also consulted TOC for placement to hospice directly.  Patient appears comfortable at this time.  I have ordered antipyretic and as needed pain medication and anxiety medications.  Nursing staff fully aware of the plan.  Final Clinical Impression(s) / ED Diagnoses Final diagnoses:  Glasgow coma scale total score 3-8, in the field (EMT or ambulance) [facility])    Rx / DC Orders ED Discharge Orders     None         Derwood Kaplan, MD 09/07/22 1159    Derwood Kaplan, MD 09/07/22 1325

## 2022-09-07 NOTE — Care Management (Signed)
Discussed case via message with MD and RN. Patient was a previous patient of [facility] palliative. MD stated the son said they had a good experience with [facility], Messaged Shania Junious, [facility] rep. She will start the process for [facility].

## 2022-09-07 NOTE — Consult Note (Signed)
Consultation Note Date: 09/07/2022   Patient Name: Megan Hester  DOB: March 17, 1930  MRN: [id number]  Age / Sex: 89 y.o., female  PCP: Geoffry Paradise, MD Referring Physician: Derwood Kaplan, MD  Reason for Consultation: Hospice Evaluation and Inpatient hospice referral  HPI/Patient Profile: 89 y.o. female  with past medical history of recent fall with resultant SDH, A-fib, MDS, SSS admitted on 09/07/2022 with altered mental status and fever.   Patient has had 2 admissions and 3 ED visits in the past 6 months with ongoing decline and worsening quality of life.  She was found unarousable this morning by family after yesterday's ED visit for headache with reassuring imaging.    In the ED, patient has no verbal response, no eye response and is noted to have extension of the left foot and stiffness.  Patient's son/HCPOA Jeannett Senior has made the decision after discussion with ED provider to proceed with comfort focused care based on patient's wishes.  She has no gag reflex. PMT has been consulted to assist with "hospice enrollment."  Clinical Assessment and Goals of Care:  I have reviewed medical records including EPIC notes, labs and imaging, assessed the patient and then met at the bedside with patient's son/HCPOA Viviann Spare to discuss diagnosis prognosis, GOC, EOL wishes, disposition and options.  I introduced Palliative Medicine as specialized medical care for people living with serious illness. It focuses on providing relief from the symptoms and stress of a serious illness. The goal is to improve quality of life for both the patient and the family.  We discussed a brief life review of the patient and then focused on their current illness.   I attempted to elicit values and goals of care important to the patient.    Medical History Review and Understanding:  Patient's son reports a good understanding of the  severity of patient's illness after discussion with emergency room team today.  He states that he has seen this coming, though it is still surprising.  Social History: Patient has 2 sons and a daughter.  She is widowed.  Advance Directives: Reviewed advanced directive on file.   Discussion: Patient's son Viviann Spare shares that he is still feeling sad and processing today's events.  He knows patient's wishes, but understandably moments like these are still never easy.  Emotional support and therapeutic listening was provided.  His sister has been updated and is aware but he is still needs to talk to his brother.  Viviann Spare has been offered chaplain support and tells me that her minister will be visiting.   We discussed their previous experience with [facility], with both his father and mother.  Son is agreeable to a referral to [facility].  We also discussed comfort focused care and limited benefit from interventions such as ongoing oxygen supplementation through nasal cannula.  Per son, patient was eating adequately up until last night, with less hydration than ideal although likely she is not yet dehydrated.  We discussed the role of decreased oral intake and prognosis at end-of-life. The natural disease trajectory and expectations at EOL were discussed.   Questions and concerns were addressed.  Gone from my sight booklet left for review. The family was encouraged to call with questions or concerns.  PMT will continue to support holistically.   SUMMARY OF RECOMMENDATIONS   -Continue DNR/DNI -Continue comfort focused care with as needed medications per [facility] -Discussed family interested in residential hospice placement with [facility] hospice liaison and TOC -Psychosocial and emotional support provided -PMT will continue  to follow and support as needed   Prognosis:  < 2 weeks  Discharge Planning: Hospice facility      Primary Diagnoses: Present on Admission: **None**   Physical Exam Vitals  and nursing note reviewed.  Constitutional:      General: She is not in acute distress.    Appearance: She is ill-appearing.     Interventions: Nasal cannula in place.     Comments: 4L O2  Cardiovascular:     Rate and Rhythm: Normal rate.  Pulmonary:     Effort: Pulmonary effort is normal. No respiratory distress.  Skin:    General: Skin is warm and dry.  Neurological:     Mental Status: She is unresponsive.    Vital Signs: BP (!) 146/76   Pulse 83   Temp (!) 102.8 F (39.3 C) (Rectal)   Resp (!) 22   SpO2 100%  Pain Scale: CPOT      SpO2: SpO2: 100 % O2 Device:SpO2: 100 % O2 Flow Rate: .O2 Flow Rate (L/min): 4 L/min   Palliative Assessment/Data: 10%    MDM: high   Ajia Chadderdon Jeni Salles, PA-C  Palliative Medicine Team Team phone # [phone]  Thank you for allowing the Palliative Medicine Team to assist in the care of this patient. Please utilize secure chat with additional questions, if there is no response within 30 minutes please call the above phone number.  Palliative Medicine Team providers are available by phone from 7am to 7pm daily and can be reached through the team cell phone.  Should this patient require assistance outside of these hours, please call the patient's attending physician.

## 2022-09-07 NOTE — ED Triage Notes (Signed)
Patient from home, family unable to wake up this morning. Recent subdural and seen yesterday at this facility and discharged. Patient unresponsive with posturing on arrival to ED.

## 2022-09-08 ENCOUNTER — Telehealth: Payer: Self-pay | Admitting: Internal Medicine

## 2022-09-10 ENCOUNTER — Inpatient Hospital Stay: Payer: Medicare Other

## 2022-09-10 ENCOUNTER — Inpatient Hospital Stay: Payer: Medicare Other | Admitting: Internal Medicine

## 2022-09-10 ENCOUNTER — Other Ambulatory Visit: Payer: Medicare Other

## 2022-09-11 NOTE — Progress Notes (Signed)
Remote ICD transmission.   

## 2022-09-11 DEATH — deceased

## 2022-09-26 ENCOUNTER — Telehealth: Payer: Self-pay | Admitting: Internal Medicine

## 2022-09-26 NOTE — Telephone Encounter (Signed)
I spoke with the patient daughter and gave her my deepest condolences. I ordered her a return kit and made sure she was marked deceased in [place] and took her out of [facility].

## 2022-09-26 NOTE — Telephone Encounter (Signed)
Deceased pt's daughter in law calling to f/u on packing that is to be sent so that she is able to send back device equipment. She would like a callback regarding this matter. Please advise
